# Patient Record
Sex: Male | Born: 1960
Health system: Southern US, Community
[De-identification: ages and names within clinical notes are randomized; demographics above are authoritative.]

## PROBLEM LIST (undated history)

## (undated) DIAGNOSIS — F32A Depression, unspecified: Secondary | ICD-10-CM

## (undated) DIAGNOSIS — I2699 Other pulmonary embolism without acute cor pulmonale: Secondary | ICD-10-CM

## (undated) DIAGNOSIS — F209 Schizophrenia, unspecified: Secondary | ICD-10-CM

## (undated) DIAGNOSIS — F329 Major depressive disorder, single episode, unspecified: Secondary | ICD-10-CM

## (undated) DIAGNOSIS — F609 Personality disorder, unspecified: Secondary | ICD-10-CM

## (undated) DIAGNOSIS — D849 Immunodeficiency, unspecified: Secondary | ICD-10-CM

## (undated) DIAGNOSIS — Z21 Asymptomatic human immunodeficiency virus [HIV] infection status: Secondary | ICD-10-CM

## (undated) DIAGNOSIS — I10 Essential (primary) hypertension: Secondary | ICD-10-CM

## (undated) DIAGNOSIS — B20 Human immunodeficiency virus [HIV] disease: Secondary | ICD-10-CM

---

## 1998-01-20 ENCOUNTER — Emergency Department (HOSPITAL_COMMUNITY): Admission: EM | Admit: 1998-01-20 | Discharge: 1998-01-20 | Payer: Self-pay | Admitting: *Deleted

## 1998-01-31 ENCOUNTER — Emergency Department (HOSPITAL_COMMUNITY): Admission: EM | Admit: 1998-01-31 | Discharge: 1998-01-31 | Payer: Self-pay | Admitting: Emergency Medicine

## 1998-02-07 ENCOUNTER — Inpatient Hospital Stay (HOSPITAL_COMMUNITY): Admission: EM | Admit: 1998-02-07 | Discharge: 1998-02-11 | Payer: Self-pay | Admitting: Emergency Medicine

## 1998-03-05 ENCOUNTER — Encounter: Admission: RE | Admit: 1998-03-05 | Discharge: 1998-03-05 | Payer: Self-pay | Admitting: Internal Medicine

## 1998-03-26 ENCOUNTER — Encounter: Admission: RE | Admit: 1998-03-26 | Discharge: 1998-03-26 | Payer: Self-pay | Admitting: Internal Medicine

## 1998-04-09 ENCOUNTER — Encounter: Admission: RE | Admit: 1998-04-09 | Discharge: 1998-04-09 | Payer: Self-pay | Admitting: Internal Medicine

## 1998-05-18 ENCOUNTER — Encounter: Admission: RE | Admit: 1998-05-18 | Discharge: 1998-05-18 | Payer: Self-pay | Admitting: Internal Medicine

## 1998-06-05 ENCOUNTER — Inpatient Hospital Stay (HOSPITAL_COMMUNITY): Admission: RE | Admit: 1998-06-05 | Discharge: 1998-06-07 | Payer: Self-pay | Admitting: *Deleted

## 1998-09-21 ENCOUNTER — Encounter: Payer: Self-pay | Admitting: Nephrology

## 1998-09-21 ENCOUNTER — Ambulatory Visit (HOSPITAL_COMMUNITY): Admission: RE | Admit: 1998-09-21 | Discharge: 1998-09-21 | Payer: Self-pay | Admitting: Nephrology

## 1998-10-08 ENCOUNTER — Emergency Department (HOSPITAL_COMMUNITY): Admission: EM | Admit: 1998-10-08 | Discharge: 1998-10-08 | Payer: Self-pay | Admitting: Emergency Medicine

## 1998-10-25 ENCOUNTER — Encounter: Payer: Self-pay | Admitting: Emergency Medicine

## 1998-10-25 ENCOUNTER — Emergency Department (HOSPITAL_COMMUNITY): Admission: EM | Admit: 1998-10-25 | Discharge: 1998-10-25 | Payer: Self-pay | Admitting: Emergency Medicine

## 1998-11-30 ENCOUNTER — Ambulatory Visit (HOSPITAL_COMMUNITY): Admission: RE | Admit: 1998-11-30 | Discharge: 1998-11-30 | Payer: Self-pay | Admitting: Hematology and Oncology

## 1998-11-30 ENCOUNTER — Encounter: Admission: RE | Admit: 1998-11-30 | Discharge: 1998-11-30 | Payer: Self-pay | Admitting: Hematology and Oncology

## 1999-01-05 ENCOUNTER — Encounter: Admission: RE | Admit: 1999-01-05 | Discharge: 1999-01-05 | Payer: Self-pay | Admitting: Internal Medicine

## 1999-02-09 ENCOUNTER — Encounter: Admission: RE | Admit: 1999-02-09 | Discharge: 1999-02-09 | Payer: Self-pay | Admitting: Internal Medicine

## 1999-03-03 ENCOUNTER — Ambulatory Visit (HOSPITAL_COMMUNITY): Admission: RE | Admit: 1999-03-03 | Discharge: 1999-03-03 | Payer: Self-pay | Admitting: Hematology and Oncology

## 1999-04-18 ENCOUNTER — Encounter: Admission: RE | Admit: 1999-04-18 | Discharge: 1999-04-18 | Payer: Self-pay | Admitting: Internal Medicine

## 1999-04-18 ENCOUNTER — Ambulatory Visit (HOSPITAL_COMMUNITY): Admission: RE | Admit: 1999-04-18 | Discharge: 1999-04-18 | Payer: Self-pay | Admitting: Internal Medicine

## 1999-07-18 ENCOUNTER — Ambulatory Visit (HOSPITAL_COMMUNITY): Admission: RE | Admit: 1999-07-18 | Discharge: 1999-07-18 | Payer: Self-pay | Admitting: Internal Medicine

## 1999-07-18 ENCOUNTER — Encounter: Admission: RE | Admit: 1999-07-18 | Discharge: 1999-07-18 | Payer: Self-pay | Admitting: Internal Medicine

## 1999-08-28 ENCOUNTER — Emergency Department (HOSPITAL_COMMUNITY): Admission: EM | Admit: 1999-08-28 | Discharge: 1999-08-28 | Payer: Self-pay | Admitting: Emergency Medicine

## 1999-12-23 ENCOUNTER — Emergency Department (HOSPITAL_COMMUNITY): Admission: EM | Admit: 1999-12-23 | Discharge: 1999-12-23 | Payer: Self-pay | Admitting: Emergency Medicine

## 2000-03-07 ENCOUNTER — Emergency Department (HOSPITAL_COMMUNITY): Admission: EM | Admit: 2000-03-07 | Discharge: 2000-03-07 | Payer: Self-pay

## 2000-03-16 ENCOUNTER — Emergency Department (HOSPITAL_COMMUNITY): Admission: EM | Admit: 2000-03-16 | Discharge: 2000-03-16 | Payer: Self-pay | Admitting: Emergency Medicine

## 2000-07-03 ENCOUNTER — Encounter: Payer: Self-pay | Admitting: *Deleted

## 2000-07-03 ENCOUNTER — Emergency Department (HOSPITAL_COMMUNITY): Admission: EM | Admit: 2000-07-03 | Discharge: 2000-07-03 | Payer: Self-pay | Admitting: *Deleted

## 2001-01-22 ENCOUNTER — Encounter: Admission: RE | Admit: 2001-01-22 | Discharge: 2001-01-22 | Payer: Self-pay | Admitting: Internal Medicine

## 2001-01-22 ENCOUNTER — Ambulatory Visit (HOSPITAL_COMMUNITY): Admission: RE | Admit: 2001-01-22 | Discharge: 2001-01-22 | Payer: Self-pay | Admitting: Internal Medicine

## 2001-01-29 ENCOUNTER — Encounter: Admission: RE | Admit: 2001-01-29 | Discharge: 2001-01-29 | Payer: Self-pay | Admitting: Internal Medicine

## 2001-07-09 ENCOUNTER — Emergency Department (HOSPITAL_COMMUNITY): Admission: EM | Admit: 2001-07-09 | Discharge: 2001-07-09 | Payer: Self-pay | Admitting: Emergency Medicine

## 2001-07-23 ENCOUNTER — Emergency Department (HOSPITAL_COMMUNITY): Admission: EM | Admit: 2001-07-23 | Discharge: 2001-07-24 | Payer: Self-pay | Admitting: Emergency Medicine

## 2001-07-24 ENCOUNTER — Encounter: Payer: Self-pay | Admitting: Emergency Medicine

## 2001-08-09 ENCOUNTER — Inpatient Hospital Stay (HOSPITAL_COMMUNITY): Admission: EM | Admit: 2001-08-09 | Discharge: 2001-08-27 | Payer: Self-pay | Admitting: *Deleted

## 2001-08-20 ENCOUNTER — Encounter: Admission: RE | Admit: 2001-08-20 | Discharge: 2001-08-20 | Payer: Self-pay | Admitting: Internal Medicine

## 2001-09-17 ENCOUNTER — Inpatient Hospital Stay (HOSPITAL_COMMUNITY): Admission: AD | Admit: 2001-09-17 | Discharge: 2001-09-27 | Payer: Self-pay | Admitting: Psychiatry

## 2001-09-29 ENCOUNTER — Encounter: Payer: Self-pay | Admitting: *Deleted

## 2001-09-29 ENCOUNTER — Inpatient Hospital Stay (HOSPITAL_COMMUNITY): Admission: EM | Admit: 2001-09-29 | Discharge: 2001-10-11 | Payer: Self-pay | Admitting: Psychiatry

## 2001-10-11 ENCOUNTER — Inpatient Hospital Stay (HOSPITAL_COMMUNITY): Admission: EM | Admit: 2001-10-11 | Discharge: 2001-10-16 | Payer: Self-pay | Admitting: Psychiatry

## 2001-11-01 ENCOUNTER — Encounter: Admission: RE | Admit: 2001-11-01 | Discharge: 2001-11-01 | Payer: Self-pay | Admitting: Internal Medicine

## 2001-11-01 ENCOUNTER — Ambulatory Visit (HOSPITAL_COMMUNITY): Admission: RE | Admit: 2001-11-01 | Discharge: 2001-11-01 | Payer: Self-pay | Admitting: Internal Medicine

## 2001-11-01 ENCOUNTER — Inpatient Hospital Stay (HOSPITAL_COMMUNITY): Admission: AD | Admit: 2001-11-01 | Discharge: 2001-11-02 | Payer: Self-pay | Admitting: Internal Medicine

## 2001-11-01 ENCOUNTER — Encounter: Payer: Self-pay | Admitting: Internal Medicine

## 2001-11-01 ENCOUNTER — Emergency Department (HOSPITAL_COMMUNITY): Admission: EM | Admit: 2001-11-01 | Discharge: 2001-11-01 | Payer: Self-pay | Admitting: *Deleted

## 2001-11-02 ENCOUNTER — Encounter: Payer: Self-pay | Admitting: Internal Medicine

## 2001-12-10 ENCOUNTER — Ambulatory Visit (HOSPITAL_COMMUNITY): Admission: RE | Admit: 2001-12-10 | Discharge: 2001-12-10 | Payer: Self-pay | Admitting: Internal Medicine

## 2001-12-10 ENCOUNTER — Encounter: Admission: RE | Admit: 2001-12-10 | Discharge: 2001-12-10 | Payer: Self-pay | Admitting: Internal Medicine

## 2002-03-19 ENCOUNTER — Encounter: Admission: RE | Admit: 2002-03-19 | Discharge: 2002-03-19 | Payer: Self-pay | Admitting: Internal Medicine

## 2002-04-02 ENCOUNTER — Ambulatory Visit (HOSPITAL_COMMUNITY): Admission: RE | Admit: 2002-04-02 | Discharge: 2002-04-02 | Payer: Self-pay | Admitting: Internal Medicine

## 2002-04-02 ENCOUNTER — Encounter: Admission: RE | Admit: 2002-04-02 | Discharge: 2002-04-02 | Payer: Self-pay | Admitting: Internal Medicine

## 2003-06-24 ENCOUNTER — Encounter: Payer: Self-pay | Admitting: Internal Medicine

## 2003-06-24 ENCOUNTER — Encounter: Admission: RE | Admit: 2003-06-24 | Discharge: 2003-06-24 | Payer: Self-pay | Admitting: Internal Medicine

## 2003-06-24 ENCOUNTER — Ambulatory Visit (HOSPITAL_COMMUNITY): Admission: RE | Admit: 2003-06-24 | Discharge: 2003-06-24 | Payer: Self-pay | Admitting: Internal Medicine

## 2003-11-29 ENCOUNTER — Inpatient Hospital Stay (HOSPITAL_COMMUNITY): Admission: EM | Admit: 2003-11-29 | Discharge: 2003-12-01 | Payer: Self-pay | Admitting: Emergency Medicine

## 2003-12-01 ENCOUNTER — Inpatient Hospital Stay (HOSPITAL_COMMUNITY): Admission: EM | Admit: 2003-12-01 | Discharge: 2003-12-07 | Payer: Self-pay | Admitting: Psychiatry

## 2003-12-16 ENCOUNTER — Encounter: Admission: RE | Admit: 2003-12-16 | Discharge: 2003-12-16 | Payer: Self-pay | Admitting: Internal Medicine

## 2004-01-06 ENCOUNTER — Ambulatory Visit (HOSPITAL_COMMUNITY): Admission: RE | Admit: 2004-01-06 | Discharge: 2004-01-06 | Payer: Self-pay

## 2004-01-06 ENCOUNTER — Encounter (INDEPENDENT_AMBULATORY_CARE_PROVIDER_SITE_OTHER): Payer: Self-pay | Admitting: Specialist

## 2004-02-11 ENCOUNTER — Ambulatory Visit (HOSPITAL_COMMUNITY): Admission: RE | Admit: 2004-02-11 | Discharge: 2004-02-11 | Payer: Self-pay | Admitting: Internal Medicine

## 2004-02-11 ENCOUNTER — Encounter: Admission: RE | Admit: 2004-02-11 | Discharge: 2004-02-11 | Payer: Self-pay | Admitting: Internal Medicine

## 2004-03-15 ENCOUNTER — Inpatient Hospital Stay (HOSPITAL_COMMUNITY): Admission: EM | Admit: 2004-03-15 | Discharge: 2004-03-25 | Payer: Self-pay | Admitting: Psychiatry

## 2006-09-08 ENCOUNTER — Ambulatory Visit: Payer: Self-pay | Admitting: *Deleted

## 2006-09-08 ENCOUNTER — Inpatient Hospital Stay (HOSPITAL_COMMUNITY): Admission: EM | Admit: 2006-09-08 | Discharge: 2006-09-20 | Payer: Self-pay | Admitting: *Deleted

## 2006-10-11 ENCOUNTER — Encounter (INDEPENDENT_AMBULATORY_CARE_PROVIDER_SITE_OTHER): Payer: Self-pay | Admitting: *Deleted

## 2006-10-11 ENCOUNTER — Encounter: Admission: RE | Admit: 2006-10-11 | Discharge: 2006-10-11 | Payer: Self-pay | Admitting: Internal Medicine

## 2006-10-11 ENCOUNTER — Ambulatory Visit: Payer: Self-pay | Admitting: Internal Medicine

## 2006-10-11 LAB — CONVERTED CEMR LAB
ALT: 16 units/L (ref 0–53)
AST: 16 units/L (ref 0–37)
Albumin: 3.8 g/dL (ref 3.5–5.2)
Alkaline Phosphatase: 72 units/L (ref 39–117)
BUN: 15 mg/dL (ref 6–23)
Basophils Absolute: 0 10*3/uL (ref 0.0–0.1)
Basophils Relative: 0 % (ref 0–1)
CD4 Count: 190 microliters
CD4 T Cell Abs: 190
CO2: 25 meq/L (ref 19–32)
Calcium: 9.6 mg/dL (ref 8.4–10.5)
Chlamydia, Swab/Urine, PCR: NEGATIVE
Chloride: 106 meq/L (ref 96–112)
Cholesterol: 139 mg/dL (ref 0–200)
Creatinine, Ser: 0.83 mg/dL (ref 0.40–1.50)
Eosinophils Absolute: 0 10*3/uL (ref 0.0–0.7)
Eosinophils Relative: 0 % (ref 0–5)
GC Probe Amp, Urine: NEGATIVE
Glucose, Bld: 93 mg/dL (ref 70–99)
HCT: 44.1 % (ref 39.0–52.0)
HCV Ab: NEGATIVE
HDL: 56 mg/dL (ref >39)
HIV 1 RNA Quant: 41800 copies/mL
HIV 1 RNA Quant: 41800 copies/mL — ABNORMAL HIGH (ref <50)
HIV-1 RNA Quant, Log: 4.62 — ABNORMAL HIGH (ref <1.70)
HIV-1 antibody: POSITIVE — AB
HIV-2 Ab: UNDETERMINED — AB
HIV: REACTIVE
Hemoglobin, Urine: NEGATIVE
Hemoglobin: 14.3 g/dL (ref 13.0–17.0)
Hep B Core Total Ab: POSITIVE — AB
Hepatitis B Surface Ag: NEGATIVE
LDL Cholesterol: 47 mg/dL (ref 0–99)
Leukocytes, UA: NEGATIVE
Lymphocytes Relative: 32 % (ref 12–46)
Lymphs Abs: 1.2 10*3/uL (ref 0.7–3.3)
MCHC: 32.4 g/dL (ref 30.0–36.0)
MCV: 95.7 fL (ref 78.0–100.0)
Monocytes Absolute: 0.5 10*3/uL (ref 0.2–0.7)
Monocytes Relative: 14 % — ABNORMAL HIGH (ref 3–11)
Neutro Abs: 2 10*3/uL (ref 1.7–7.7)
Neutrophils Relative %: 54 % (ref 43–77)
Nitrite: NEGATIVE
Platelets: 135 10*3/uL — ABNORMAL LOW (ref 150–400)
Potassium: 3.9 meq/L (ref 3.5–5.3)
Protein, ur: NEGATIVE mg/dL
RBC: 4.61 M/uL (ref 4.22–5.81)
RDW: 14.1 % — ABNORMAL HIGH (ref 11.5–14.0)
Sodium: 140 meq/L (ref 135–145)
Specific Gravity, Urine: 1.037 — ABNORMAL HIGH (ref 1.005–1.03)
Total Bilirubin: 0.3 mg/dL (ref 0.3–1.2)
Total CHOL/HDL Ratio: 2.5
Total Protein: 8.3 g/dL (ref 6.0–8.3)
Triglycerides: 179 mg/dL — ABNORMAL HIGH (ref <150)
Urine Glucose: NEGATIVE mg/dL
Urobilinogen, UA: 1 (ref 0.0–1.0)
VLDL: 36 mg/dL (ref 0–40)
WBC: 3.8 10*3/uL — ABNORMAL LOW (ref 4.0–10.5)
pH: 6.5 (ref 5.0–8.0)

## 2006-10-24 ENCOUNTER — Ambulatory Visit: Payer: Self-pay | Admitting: Internal Medicine

## 2006-10-29 ENCOUNTER — Ambulatory Visit: Payer: Self-pay | Admitting: *Deleted

## 2006-10-29 ENCOUNTER — Inpatient Hospital Stay (HOSPITAL_COMMUNITY): Admission: AD | Admit: 2006-10-29 | Discharge: 2006-11-01 | Payer: Self-pay | Admitting: *Deleted

## 2006-11-09 DIAGNOSIS — E039 Hypothyroidism, unspecified: Secondary | ICD-10-CM

## 2006-11-09 DIAGNOSIS — N12 Tubulo-interstitial nephritis, not specified as acute or chronic: Secondary | ICD-10-CM | POA: Insufficient documentation

## 2006-11-09 DIAGNOSIS — F32A Depression, unspecified: Secondary | ICD-10-CM | POA: Insufficient documentation

## 2006-11-09 DIAGNOSIS — F319 Bipolar disorder, unspecified: Secondary | ICD-10-CM | POA: Insufficient documentation

## 2006-11-09 DIAGNOSIS — F1910 Other psychoactive substance abuse, uncomplicated: Secondary | ICD-10-CM

## 2006-11-09 DIAGNOSIS — F411 Generalized anxiety disorder: Secondary | ICD-10-CM | POA: Insufficient documentation

## 2006-11-09 DIAGNOSIS — B20 Human immunodeficiency virus [HIV] disease: Secondary | ICD-10-CM | POA: Insufficient documentation

## 2006-11-09 DIAGNOSIS — F17200 Nicotine dependence, unspecified, uncomplicated: Secondary | ICD-10-CM

## 2006-11-09 DIAGNOSIS — F200 Paranoid schizophrenia: Secondary | ICD-10-CM | POA: Insufficient documentation

## 2006-11-09 DIAGNOSIS — D012 Carcinoma in situ of rectum: Secondary | ICD-10-CM | POA: Insufficient documentation

## 2006-11-09 DIAGNOSIS — B370 Candidal stomatitis: Secondary | ICD-10-CM

## 2006-11-09 DIAGNOSIS — A879 Viral meningitis, unspecified: Secondary | ICD-10-CM | POA: Insufficient documentation

## 2006-11-09 DIAGNOSIS — F329 Major depressive disorder, single episode, unspecified: Secondary | ICD-10-CM

## 2006-11-12 ENCOUNTER — Emergency Department (HOSPITAL_COMMUNITY): Admission: EM | Admit: 2006-11-12 | Discharge: 2006-11-13 | Payer: Self-pay | Admitting: Emergency Medicine

## 2006-11-19 ENCOUNTER — Encounter (INDEPENDENT_AMBULATORY_CARE_PROVIDER_SITE_OTHER): Payer: Self-pay | Admitting: *Deleted

## 2006-11-19 LAB — CONVERTED CEMR LAB
CD4 Count: 0 microliters
HIV 1 RNA Quant: 0 copies/mL

## 2006-11-22 ENCOUNTER — Encounter: Payer: Self-pay | Admitting: Internal Medicine

## 2006-12-02 ENCOUNTER — Encounter (INDEPENDENT_AMBULATORY_CARE_PROVIDER_SITE_OTHER): Payer: Self-pay | Admitting: *Deleted

## 2006-12-18 ENCOUNTER — Telehealth: Payer: Self-pay | Admitting: Internal Medicine

## 2007-01-06 ENCOUNTER — Emergency Department (HOSPITAL_COMMUNITY): Admission: EM | Admit: 2007-01-06 | Discharge: 2007-01-06 | Payer: Self-pay | Admitting: Emergency Medicine

## 2007-01-09 ENCOUNTER — Telehealth: Payer: Self-pay | Admitting: Internal Medicine

## 2007-03-08 ENCOUNTER — Telehealth: Payer: Self-pay | Admitting: Internal Medicine

## 2007-04-12 ENCOUNTER — Encounter (INDEPENDENT_AMBULATORY_CARE_PROVIDER_SITE_OTHER): Payer: Self-pay | Admitting: *Deleted

## 2007-07-09 ENCOUNTER — Encounter (INDEPENDENT_AMBULATORY_CARE_PROVIDER_SITE_OTHER): Payer: Self-pay | Admitting: *Deleted

## 2007-07-10 ENCOUNTER — Ambulatory Visit: Payer: Self-pay | Admitting: Internal Medicine

## 2007-07-10 ENCOUNTER — Encounter: Admission: RE | Admit: 2007-07-10 | Discharge: 2007-07-10 | Payer: Self-pay | Admitting: Internal Medicine

## 2007-07-10 DIAGNOSIS — M79609 Pain in unspecified limb: Secondary | ICD-10-CM | POA: Insufficient documentation

## 2007-07-10 LAB — CONVERTED CEMR LAB
ALT: 15 units/L (ref 0–53)
AST: 25 units/L (ref 0–37)
Albumin: 3.7 g/dL (ref 3.5–5.2)
Alkaline Phosphatase: 64 units/L (ref 39–117)
BUN: 18 mg/dL (ref 6–23)
Basophils Absolute: 0 10*3/uL (ref 0.0–0.1)
Basophils Relative: 0 % (ref 0–1)
CO2: 21 meq/L (ref 19–32)
Calcium: 9.3 mg/dL (ref 8.4–10.5)
Chloride: 110 meq/L (ref 96–112)
Creatinine, Ser: 1.04 mg/dL (ref 0.40–1.50)
Eosinophils Absolute: 0 10*3/uL (ref 0.0–0.7)
Eosinophils Relative: 1 % (ref 0–5)
Glucose, Bld: 104 mg/dL — ABNORMAL HIGH (ref 70–99)
HCT: 41.8 % (ref 39.0–52.0)
HIV 1 RNA Quant: 25500 copies/mL — ABNORMAL HIGH (ref <50)
HIV-1 RNA Quant, Log: 4.41 — ABNORMAL HIGH (ref <1.70)
Hemoglobin: 14 g/dL (ref 13.0–17.0)
Lymphocytes Relative: 36 % (ref 12–46)
Lymphs Abs: 1.4 10*3/uL (ref 0.7–3.3)
MCHC: 33.5 g/dL (ref 30.0–36.0)
MCV: 93.1 fL (ref 78.0–100.0)
Monocytes Absolute: 0.6 10*3/uL (ref 0.2–0.7)
Monocytes Relative: 15 % — ABNORMAL HIGH (ref 3–11)
Neutro Abs: 1.8 10*3/uL (ref 1.7–7.7)
Neutrophils Relative %: 48 % (ref 43–77)
Platelets: 80 10*3/uL — ABNORMAL LOW (ref 150–400)
Potassium: 3.9 meq/L (ref 3.5–5.3)
RBC: 4.49 M/uL (ref 4.22–5.81)
RDW: 14.5 % — ABNORMAL HIGH (ref 11.5–14.0)
Sodium: 141 meq/L (ref 135–145)
Total Bilirubin: 0.4 mg/dL (ref 0.3–1.2)
Total Protein: 7.9 g/dL (ref 6.0–8.3)
WBC: 3.8 10*3/uL — ABNORMAL LOW (ref 4.0–10.5)

## 2007-08-07 ENCOUNTER — Telehealth: Payer: Self-pay | Admitting: Internal Medicine

## 2007-09-03 ENCOUNTER — Ambulatory Visit: Payer: Self-pay | Admitting: Internal Medicine

## 2007-09-03 ENCOUNTER — Encounter (INDEPENDENT_AMBULATORY_CARE_PROVIDER_SITE_OTHER): Payer: Self-pay | Admitting: *Deleted

## 2007-09-03 ENCOUNTER — Encounter: Admission: RE | Admit: 2007-09-03 | Discharge: 2007-09-03 | Payer: Self-pay | Admitting: Internal Medicine

## 2007-09-03 DIAGNOSIS — F528 Other sexual dysfunction not due to a substance or known physiological condition: Secondary | ICD-10-CM | POA: Insufficient documentation

## 2007-09-03 LAB — CONVERTED CEMR LAB
ALT: 21 units/L (ref 0–53)
AST: 20 units/L (ref 0–37)
Albumin: 4 g/dL (ref 3.5–5.2)
Alkaline Phosphatase: 64 units/L (ref 39–117)
BUN: 22 mg/dL (ref 6–23)
Basophils Absolute: 0 10*3/uL (ref 0.0–0.1)
Basophils Relative: 0 % (ref 0–1)
CO2: 24 meq/L (ref 19–32)
Calcium: 9.4 mg/dL (ref 8.4–10.5)
Chloride: 107 meq/L (ref 96–112)
Creatinine, Ser: 1.11 mg/dL (ref 0.40–1.50)
Eosinophils Absolute: 0 10*3/uL — ABNORMAL LOW (ref 0.2–0.7)
Eosinophils Relative: 1 % (ref 0–5)
Glucose, Bld: 78 mg/dL (ref 70–99)
HCT: 42.5 % (ref 39.0–52.0)
HIV 1 RNA Quant: 53 copies/mL — ABNORMAL HIGH (ref <50)
HIV-1 RNA Quant, Log: 1.72 — ABNORMAL HIGH (ref <1.70)
Hemoglobin: 14.4 g/dL (ref 13.0–17.0)
Lymphocytes Relative: 34 % (ref 12–46)
Lymphs Abs: 1.2 10*3/uL (ref 0.7–4.0)
MCHC: 33.9 g/dL (ref 30.0–36.0)
MCV: 99.3 fL (ref 78.0–100.0)
Monocytes Absolute: 0.4 10*3/uL (ref 0.1–1.0)
Monocytes Relative: 11 % (ref 3–12)
Neutro Abs: 2 10*3/uL (ref 1.7–7.7)
Neutrophils Relative %: 54 % (ref 43–77)
Platelets: 152 10*3/uL (ref 150–400)
Potassium: 4.4 meq/L (ref 3.5–5.3)
RBC: 4.28 M/uL (ref 4.22–5.81)
RDW: 19.3 % — ABNORMAL HIGH (ref 11.5–15.5)
Sodium: 142 meq/L (ref 135–145)
Total Bilirubin: 0.4 mg/dL (ref 0.3–1.2)
Total Protein: 7.9 g/dL (ref 6.0–8.3)
WBC: 3.6 10*3/uL — ABNORMAL LOW (ref 4.0–10.5)

## 2007-09-05 ENCOUNTER — Telehealth: Payer: Self-pay | Admitting: Internal Medicine

## 2007-09-11 ENCOUNTER — Encounter: Payer: Self-pay | Admitting: Internal Medicine

## 2007-09-11 LAB — CONVERTED CEMR LAB
HCV Ab: NEGATIVE
Hep B S Ab: UNDETERMINED
Hepatitis B Surface Ag: NEGATIVE

## 2007-09-24 ENCOUNTER — Encounter (INDEPENDENT_AMBULATORY_CARE_PROVIDER_SITE_OTHER): Payer: Self-pay | Admitting: *Deleted

## 2007-09-24 ENCOUNTER — Encounter: Payer: Self-pay | Admitting: Internal Medicine

## 2007-10-11 ENCOUNTER — Telehealth: Payer: Self-pay | Admitting: Internal Medicine

## 2007-10-22 ENCOUNTER — Telehealth: Payer: Self-pay | Admitting: Internal Medicine

## 2007-11-05 ENCOUNTER — Telehealth: Payer: Self-pay | Admitting: Internal Medicine

## 2007-12-05 ENCOUNTER — Telehealth: Payer: Self-pay | Admitting: Internal Medicine

## 2007-12-12 ENCOUNTER — Encounter: Payer: Self-pay | Admitting: Internal Medicine

## 2008-01-10 ENCOUNTER — Telehealth (INDEPENDENT_AMBULATORY_CARE_PROVIDER_SITE_OTHER): Payer: Self-pay | Admitting: *Deleted

## 2008-01-14 ENCOUNTER — Telehealth: Payer: Self-pay | Admitting: Internal Medicine

## 2008-01-14 ENCOUNTER — Encounter (INDEPENDENT_AMBULATORY_CARE_PROVIDER_SITE_OTHER): Payer: Self-pay | Admitting: *Deleted

## 2008-02-07 ENCOUNTER — Telehealth (INDEPENDENT_AMBULATORY_CARE_PROVIDER_SITE_OTHER): Payer: Self-pay | Admitting: *Deleted

## 2008-02-13 ENCOUNTER — Encounter (INDEPENDENT_AMBULATORY_CARE_PROVIDER_SITE_OTHER): Payer: Self-pay | Admitting: *Deleted

## 2008-02-25 ENCOUNTER — Inpatient Hospital Stay (HOSPITAL_COMMUNITY): Admission: RE | Admit: 2008-02-25 | Discharge: 2008-03-04 | Payer: Self-pay | Admitting: Psychiatry

## 2008-02-25 ENCOUNTER — Ambulatory Visit: Payer: Self-pay | Admitting: Psychiatry

## 2008-03-04 ENCOUNTER — Telehealth (INDEPENDENT_AMBULATORY_CARE_PROVIDER_SITE_OTHER): Payer: Self-pay | Admitting: *Deleted

## 2008-05-05 ENCOUNTER — Telehealth: Payer: Self-pay | Admitting: Internal Medicine

## 2008-07-29 ENCOUNTER — Telehealth: Payer: Self-pay | Admitting: Internal Medicine

## 2008-12-08 ENCOUNTER — Telehealth (INDEPENDENT_AMBULATORY_CARE_PROVIDER_SITE_OTHER): Payer: Self-pay | Admitting: *Deleted

## 2009-01-20 ENCOUNTER — Encounter (INDEPENDENT_AMBULATORY_CARE_PROVIDER_SITE_OTHER): Payer: Self-pay | Admitting: *Deleted

## 2011-02-07 NOTE — H&P (Signed)
NAMEPAULINE, TRAINER NO.:  [id number]   MEDICAL RECORD NO.:  [id number]          PATIENT TYPE:  IPS   LOCATION:  0405                          FACILITY:  [facility]   PHYSICIAN:  Antonietta Breach, M.D.  DATE OF BIRTH:  Feb 23, 1961   DATE OF ADMISSION:  02/25/2008  DATE OF DISCHARGE:                       PSYCHIATRIC ADMISSION ASSESSMENT   DATE/TIME OF ASSESSMENT:  February 26, 2008, at 10:00 a.m.   IDENTIFYING INFORMATION:  A 50 year old divorced African American male.  This is a voluntary admission.   HISTORY OF PRESENT ILLNESS:  This is one of several psychiatric  admissions at [facility] for this pleasant 50 year old with a history of  schizophrenia, paranoid type.  He presented in the emergency room on  June 2 complaining of a lot of pain in his shoulder and had some sutures  in his head after he had fallen on a curb the day before.  He had also  been seen on June 1 at the [facility] emergency room for the  same complaint.  He then reported to mental health, who referred him for  psychiatric admission after they learned that he had been smoking  approximately 500 dollars' worth of cocaine within the past week.  Complaining about voices and feeling very depressed.  He is currently  living in a rooming house and says he does not have enough money to get  through the month even though he is on disability.  Estranged from his  wife and children and feeling depressed without a clear suicidal plan.  He also complained of auditory hallucinations for the past week with  voices screaming at him, decreased sleep, and having a lot of anxiety.  He also endorsed dysphoric mood, hopelessness, helplessness and a lot of  worry and fear about his medical issues.  Denies any homicidal thoughts.  Endorses continued auditory hallucinations today, also endorses using  some alcohol with last use on May 25.   PAST PSYCHIATRIC HISTORY:  This is the third or fourth [facility] admission  with his  last one being in February 2008, at that time also for  psychosis, and at that time he was discharged on Seroquel 100 mg q.6 h  p.r.n. and 100 mg q.h.s. and Depakote ER 500 mg p.o. q.h.s.   SOCIAL HISTORY:  Divorced, has 4 children with his wife, who has the  children living with her.  The children are ages 38, 37, 66 and 90.  He  has been unsuccessful with trying to contact them and visit them since  they keep moving frequently.  Currently living in a rented room.  Endorses problems with alcohol and cocaine abuse.  Both parents are  deceased.  He has 2 brothers and 2 sisters who are living and well.  He  denies current legal problems.   FAMILY HISTORY:  Remarkable for a father with history of alcoholism.   ALCOHOL AND DRUG HISTORY:  Endorses use of alcohol and crack with his  last use of alcohol on June 1, when he drank 2 fifths of whiskey.  History of abstinence is not clear.   MEDICAL HISTORY:  The patient is followed by Yisroel Ramming M.D., at the  [facility].  Medical problems include HIV  positivity and tardive dyskinesia.   CURRENT MEDICATIONS:  Kaletra 200/50 mg tablets 2 tablets b.i.d.,  Combivir 150 mg/300 mg 1 tablet b.i.d., Bactrim 80/160 one daily, and he  reports compliance with these medications.  Previously on Seroquel but  dose unclear.   DRUG ALLERGIES:  AMOXICILLIN.   PHYSICAL EXAMINATION:  Physical exam was done in the emergency room as  noted in the record.  He has a history of bruising on his nose and on  the right side of his face, also some bruising on his left knee and has  complained of some left shoulder pain.  Physical exam is noted in the  record.   Diagnostic studies were done in the emergency room.  His TSH is normal  at 1.595.  Metabolic panel reveals a normal chemistry with  the BUN at  22, creatinine 1.18.  Liver enzymes mildly elevated with SGOT 113, SGPT  46, alkaline phosphatase 79 and total bilirubin 0.9.  CBC:  WBC  5.3,  hemoglobin 14.9, hematocrit 43.4 and platelets 121,000.  MCV is 107.3.   MENTAL STATUS EXAM:  Fully alert male.  Pleasant, cooperative.  Guarded  affect.  A bit anxious.  Oriented x 3, in full contact with reality.  Thought processes are coherent, linear, appropriate.  Judgment is  slightly impaired.  Insight is partial.  Affect does appear anxious.  Speech minimal in production but otherwise normal in pace, tone and  form.  His eye contact is poor, not really wanting to carry on a lot of  conversation, but he is generally cooperative.  Oriented to person,  place and situation.  Acknowledges that he has been abusing substances  and asking for help.   AXIS I:  Schizophrenia, undifferentiated type, chronic with acute  exacerbation.  Rule out major depressive disorder.  Alcohol abuse, rule  out dependence.  Cocaine abuse.  AXIS II:  Deferred.  AXIS III:  HIV, tardive dyskinesia, facial laceration, healed and  sutured, left shoulder contusion, and macrocytosis NOS.  AXIS IV:  Severe issues with family stressors and grief over  estrangement from children.  AXIS V:  Current 30, past year not known.   The plan is to voluntarily admit the patient to stabilize him,  help him  with housing, and safely detox him off alcohol.  We started him on a  Librium protocol. We will add folic acid 1 mg.  Started him on Celexa 10  mg q.a.m. and Seroquel 900 mg h.s. for his hallucinations.   Estimated length of stay is 7 days.      Margaret A. Lorin Picket, N.P.      Antonietta Breach, M.D.  Electronically Signed    MAS/MEDQ  D:  02/26/2008  T:  02/26/2008  Job:  [id number]
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
## 2011-02-10 NOTE — Discharge Summary (Signed)
[facility]  Patient:    [name] Herring, [name] Herring Visit Number: [id number] MRN: [id number]          Service Type: EMS Location: [place] Attending Physician:  Carmelina Peal Dictated by:   Reymundo Poll Dub Mikes, M.D. Admit Date:  11/01/2001 Discharge Date: 11/01/2001                             Discharge Summary  CHIEF COMPLAINT/PRESENT ILLNESS:  This was one of multiple admissions to [facility] for this 50 year old male that was just recently discharged, less than eight hours, from the last hospitalization.  [facility] but [facility] not [facility] to [facility] him.  He felt unsafe on his own.  He felt agitated and fearful with some auditory hallucinations, and he went to the emergency room requesting readmission.  PAST PSYCHIATRIC HISTORY:  Multiple hospitalizations, being seen on an outpatient basis at the [facility].  SOCIAL HISTORY:  Separated from his wife, basically homeless.  ALCOHOL/DRUG HISTORY:  History of alcohol and crack cocaine dependence, recently detoxed.  MEDICAL PROBLEMS:  HIV positive.  MEDICATIONS:  Upon admission: 1. Abilify 15 in the morning and 30 at bedtime. 2. Trazodone 150 at bedtime.  PHYSICAL EXAMINATION:  Performed in the emergency room:  No active findings.  MENTAL STATUS EXAMINATION:  Upon admission, revealed an alert, cooperative male, some rigidity.  Anxious affect, polite, cooperative.  Mood depressed, anxious.  Affect broad.  Thought processes logical and appropriate, complains of some auditory hallucinations, no suicidal or homicidal ideas.  Concerned about the disposition.  Cognition, well oriented to person, place, and time.  ADMITTING DIAGNOSES: Axis I:    Schizophrenia, chronic paranoid type; alcohol and cocaine            dependence in early partial remission. Axis II:   No diagnosis. Axis III:  Human immunodeficiency virus (HIV) positive. Axis IV:    Moderate to severe. Axis V:    Upon admission 40-45, highest Global Assessment of Functioning in            the last year 60.  HOSPITAL COURSE:  He was readmitted.  He was placed back on his medications. We worked with him to try to find him a place to stay.  There was a bed available in the [facility].  He was to follow up at [facility].  Upon discharge, stable, hopeful that this is going to work out, no suicidal and no homicidal ideas.  DISCHARGE DIAGNOSES: Axis I:    Schizophrenia, chronic, paranoid type; cocaine and alcohol            dependence in early partial remission. Axis II:   No diagnosis. Axis III:  Human immunodeficiency virus (HIV) positive. Axis IV:   Moderate. Axis V:    Upon discharge 50.  DISCHARGE MEDICATIONS: 1. Abilify 15 in the morning and 30 at bedtime. 2. Cogentin 1 mg twice a day. 3. Trazodone 150 at bedtime for sleep.  DISPOSITION:  Discharged to [facility] and appointment at [facility]. Dictated by:   Reymundo Poll Dub Mikes, M.D. Attending Physician:  Carmelina Peal DD:  11/27/01 TD:  11/29/01 Job: [id number]

## 2011-02-10 NOTE — Op Note (Signed)
NAME:  [name] Herring, [name] Herring NO.:  [id number]   MEDICAL RECORD NO.:  [id number]                   PATIENT TYPE:  AMB   LOCATION:  DAY                                  FACILITY:  [facility]   PHYSICIAN:  Lorre Munroe., M.D.            DATE OF BIRTH:  1960/10/10   DATE OF PROCEDURE:  01/06/2004  DATE OF DISCHARGE:                                 OPERATIVE REPORT   PREOPERATIVE DIAGNOSIS:  Perianal and intraanal warts.   POSTOPERATIVE DIAGNOSIS:  Perianal and intraanal warts.   OPERATION:  Excision and cautery of warts.   SURGEON:  Zigmund Daniel, M.D.   ANESTHESIA:  General.   DESCRIPTION OF PROCEDURE:  After the patient was monitored and anesthetized  and had routine preparation and draping of the perineum, I thoroughly  examined the area and found a large number of very large, mostly  pedunculated perianal warts.  There were also some intraanal warts which  were palpable.  Working first externally where the warts formed a pedicle of  skin which appeared normal and right up to them, I cut off the pedicle and  the entire wart.  In other locations where the warts were more sessile, I  thoroughly cauterized them down to the area of the dermis and would wipe  them and then cauterize them again until I felt I had totally ablated the  warts.  I got hemostasis with the cautery.  After removing all of the  external warts, I used an intraanal retractor and treated the internal warts  in a similar fashion.  They were smaller and more sessile but fairly  extensive.  After treating and retreating all areas, I thoroughly  anesthetized the whole anus and perianal area with long acting local  anesthetic and applied a small, nonstick bandage.  The patient tolerated the  operation well.                                               Lorre Munroe., M.D.    WB/MEDQ  D:  01/06/2004  T:  01/06/2004  Job:  [id number]

## 2011-02-10 NOTE — Discharge Summary (Signed)
[place]  Patient:    [name] Herring, [name] Herring Visit Number: [id number] MRN: [id number]          Service Type: Attending:  C. Ulyess Mort, M.D. Dictated by:   Lendell Caprice, M.D. Adm. Date:  11/01/01 Disc. Date: 11/02/01                             Discharge Summary  CONTINUATION  LABORATORY DATA AND X-RAY FINDINGS:  Admission laboratories with sodium 136, potassium 3.6, chloride 108, CO2 29, BUN 20, creatinine 0.9, glucose 93. Total bilirubin 0.6, LDH 158.  White blood count 4.2, hemoglobin 13.4, platelets 96.  Alk phos 73, AST 26, ALT 22, total protein 7.9, albumin 3.4, calcium 9.2.  ABG on room air with pH 7.437, pCO2 37, pO2 88, saturating 96.9%.  HOSPITAL COURSE:  #1 - COUGH WITH PRODUCTIVE SPUTUM:  The patient was admitted and started on antibiotic therapy to cover for PCP.  However, this was very low on our differential.  After obtaining a normal LDH and the gas which showed no evidence of hypoxia, we did not feel that this patient had Pneumocystis pneumoniae.  We are unsure as to his CD4 count and he was started on opportunistic infection prophylaxis during this hospitalization.  His chest x-ray was negative for infiltrate and the patient was treated symptomatically for an acute bronchitis likely secondary to Streptococcus.  The patient is afebrile with leukocytosis and is discharged on a 10-day course of amoxicillin to be taken 500 mg p.o. b.i.d.  Blood cultures were obtained during this hospitalization as well as respiratory and sputum cultures.  AFB cultures were obtained which were negative x3.  The patients respiratory culture grew out abundant Streptococcus pneumonia, penicillin-sensitive.  The patient was instructed to complete his entire course of antibiotic therapy and will return to the clinic in two weeks time to see Dr. Aundria Rud, the patients primary care physician.  #2 - ATYPICAL CHEST PAIN:  The patient reported recent cocaine  use and the patient was evaluated on telemetry and had no evidence of arrhythmias.  He was cycled with enzymes and ruled out for any kind of acute myocardial event.  His EKG showed a normal sinus rhythm.  The etiology of the this patients chest pain was likely secondary to his acute infection with probable bronchitis. The patient did undergo a 2D echocardiogram that showed a normal LV function. The patient was instructed to discontinue his cocaine use and he will be seen in followup at [facility] for further drug rehabilitation treatment.  #3 - CARDIOMEGALY ON CHEST X-RAY:  The patients chest x-ray was reevaluated and positive for cardiomegaly.  Therefore, a 2D echocardiogram was obtained. The patient had no signs of heart failure.  According to the echocardiogram results, the patient has a mildly dilated right ventricle with mild decrease in function, but a normal left ventricle.  #4 - SCHIZOPHRENIA:  The patient was continued on his medications with Cogentin per [facility].  He will be seen at [facility] after discharge.  #5 - HISTORY OF SUICIDAL IDEATION:  The patient had recently been discharged on October 11, 2001, from [facility] and was put on suicide precautions during this hospitalization.  However, he did deny any suicidal ideation.  He will be seen by [facility] on Monday after discharge for further treatment of his schizophrenia.  #6 - ACQUIRED IMMUNE DEFICIENCY SYNDROME:  The patient was continued HAART two weeks ago  secondary to an acute schizophrenic episode.  The patient was covered for opportunistic infections and the patient was continued on his HAART therapy and discharged on Trizivir.  He will see Dr. Aundria Rud in followup in two weeks time.  #7 - RECTAL WARTS:  These are likely condylomata.  The patient was treated symptomatically with Anusol during this hospitalization with some relief.  In the future, this patient may need to be  referred for surgical removal should they continue to cause him significant pain.  #8 - AUDITORY HALLUCINATIONS LIKELY SECONDARY TO HIS SCHIZOPHRENIA AND BEING OFF HIS MEDICATIONS FOR THE LAST SEVERAL DAYS:  The patient was given a two-day supply of Zyprexa 40 mg p.o. q.d. after reporting that he had been on this medication until recently.  The patient will be seen at [facility] on Monday for further followup. Dictated by:   Lendell Caprice, M.D. Attending:  C. Ulyess Mort, M.D. DD:  02/04/02 TD:  02/06/02 Job: [id number]
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
## 2011-02-10 NOTE — H&P (Signed)
[facility]  Patient:    [name] Herring, [name] Herring Visit Number: [id number] MRN: [id number]          Service Type: PSY Location: 300 [id number] [id number] Attending Physician:  Jasmine Pang Dictated by:   Candi Leash. Orsini, N.P. Admit Date:  08/09/2001                     Psychiatric Admission Assessment  IDENTIFYING INFORMATION:  A 50 year old separated African-American male voluntarily admitted on August 09, 2001, for positive auditory hallucinations, positive suicidal ideation and positive homicidal ideation.  HISTORY OF PRESENT ILLNESS:  The patient presents with a history of schizophrenia.  He reports he is having auditory hallucinations to kill himself, and having homicidal ideation to kill his wifes boyfriend, with thoughts to douse with gasoline and strike a Microbiologist.  The patient reports that he is presently homeless.  His wife threw him and his belongings on the street.  The patient has a history of HIV and currently is receiving treatment.  He reports he is having decreased sleep, decreased appetite with no reported weight loss.  He reports he is no longer having thoughts to harm himself but would still harm his wifes boyfriend if he had the opportunity. He reports that he would like to have some long-term treatment.  PAST PSYCHIATRIC HISTORY:  Second admission to [facility].  He has a diagnosis of schizophrenia.  He has been at [facility] and [facility] in the past.  Last hospitalization was at [facility] about a month ago for psychosis.  The patient sees Dr. Jearld Lesch at [facility]  The patient has a past history of overdosing and "hanging himself while hospitalized.  SOCIAL HISTORY:  He is a 50 year old separated African-American male.  He has been separated for 2 years.  He has 4 children, ages 42, 4, 53 and 20.  He states he is homeless.  He is on disability for psychiatric illness. Completed the 12th  grade, no legal problems.  FAMILY HISTORY:  None.  ALCOHOL DRUG HISTORY:  The patient smokes 1-1/2 packs per day.  Alcohol intake is little to known. The patient has a past history of heroin IV use.  He has been clean for 8 months, with the longest history of being drug free has been 15 years.  PAST MEDICAL HISTORY:  Primary care [facility] is none.  Medical problems are HIV, diagnosed 4 years ago.  He attends [facility], infectious disease clinic, sees Dr. Aundria Rud.  MEDICATIONS:  Risperdal 4 mg q.h.s., Paxil 40 mg, Trizivir 1 p.o. b.i.d. for HIV.  DRUG ALLERGIES:  No known allergies.  PHYSICAL EXAMINATION  REVIEW OF SYSTEMS:  Patient denies any fever or chills, reports a changes in appetite with no reported weight loss.  No blurred or double vision.  No hearing loss or sinus pain.  No chest pain or palpitations.  Patient smokes, no shortness of breath or cough.  No heartburn or change in habits.  GI:  No dysuria, frequency, or hematuria.  MUSCULOSKELETAL:  No stiffness or swelling or joint pain.  SKIN: No itching, wounds or rash.  NEUROLOGIC:  No numbness on tingling.  PSYCHIATRIC:  History of schizophrenia with suicidal or homicidal thoughts and past suicide attempts.  ENDOCRINE:  No thyroid or diabetic problems.  LYMPHATIC:  History of HIV.  ALLERGIES:  No environmental allergies.  VITAL SIGNS:  97, 74, 22, blood pressure 112/63.  He is 158 pounds.  He  is 5 feet 8 inches tall.  LABORATORY DATA:  WBC count is low at 3.6, RDW is 19.1, platelet count is 134.  GENERAL APPEARANCE:    Patient is a 50 year old African-American male in no acute distress.  He is well developed, appears his stated age.  He is alert and cooperative, adequately groomed.  HEAD:  Normocephalic, he can raise his eyebrows.  His hair is short, dark and of equal distribution.  EYES:  His EOMs are intact bilaterally.  MOUTH:  His external ear canals are patent.  His hearing is appropriate to  conversation. No sinus tenderness or nasal discharge.  Mucosa is moist with fair dentition. No lesions were seen.  Tongue protrudes midline without tremor. NECK:  Supple, no JVD, negative lymphadenopathy.  Thyroid is nonpalpable and nontender.  Trachea is midline.  CHEST:  Clear to auscultation.  No adventitious sounds.  HEART:  Regular rate and rhythm, without murmurs, gallops or rubs.  Carotid pulses are equal and adequate.  GENITALIA EXAM is deferred.  ABDOMEN:  Soft, nontender abdomen.  No CVA tenderness. MUSCULOSKELETAL:  No joint swelling or deformity.  Good range of motion. Muscle strength and tone is equal bilaterally.  SKIN:  Warm and dry.  Nail beds are pink with good capillary refill.  NEUROLOGIC:  He is oriented x 3.  Good grip strength bilaterally.  No involuntary movements.  Cerebellar functions were intact, with heel-to-shin, normal alternating movements.  Romberg was negative.  MENTAL STATUS EXAMINATION:  He is an alert, middle-aged, thin, African-American male.  He is casually dressed with some odor present.  He is cooperative.  Speech is normal and relevant.  Mood is depressed, affect is blunt.  Thought processes are positive auditory hallucinations saying derogatory comments, positive homicidal ideation to the boyfriend of his wife. No suicidal ideation.  No paranoia.  Cognitive function is intact.  Memory is fair, judgment is poor, insight is poor.  ADMISSION DIAGNOSES: Axis I:    1. Psychosis not otherwise specified.            2. Rule out schizophrenia.            3. Rule out major depression with psychotic features. Axis II:   Deferred. Axis III:  HIV. Axis IV:   Problems with primary support group, housing and medical            problems and other psychosocial problems. Axis V:    Current 30, estimated this past year 60.  INITIAL PLAN OF CARE:  Voluntary admission to [facility] for psychosis, suicidal or homicidal ideations.  Contract  for safety, check every 15 minutes.  Patient contracts.  Will resume his routine medications.  Will  contact the outpatient department on Monday for recommendations.  Case worker to look at living arrangements after discharge.  Will obtain further labs. Our goal is to stabilize mood and thinking so patient and others can be safe, to follow up with [facility].  TENTATIVE LENGTH OF STAY:  Four to five days or more depending on housing and response to medications. Dictated by:   Candi Leash. Orsini, N.P. Attending Physician:  Jasmine Pang DD:  08/13/01 TD:  08/13/01 Job: [id number]

## 2011-02-10 NOTE — Discharge Summary (Signed)
NAME:  [name] Herring, [name] Herring NO.:  [id number]   MEDICAL RECORD NO.:  [id number]                   PATIENT TYPE:  IPS   LOCATION:  0507                                 [facility]ACILITY:  [facility]   PHYSICIAN:  Geoffery Lyons, M.D.                   DATE OF BIRTH:  June 28, 1961   DATE OF ADMISSION:  12/01/2003  DATE OF DISCHARGE:  12/07/2003                                 DISCHARGE SUMMARY   CHIEF COMPLAINT AND PRESENT ILLNESS:  This was the fourth admission to [facility] for this 50 year old separated African-American male  voluntarily admitted.  History of schizophrenia.  Transferred from the  medicine unit after taking overdose on an unknown amount of Seroquel,  Risperdal and possibly some Lexapro and ibuprofen.  He called the emergency  medical services from his hotel room, was taken to the emergency room and  admitted for medical clearance.  Felt overwhelmed by auditory hallucinations  with commands to hurt himself.  Gradually worse for the past four months.  Had begun using crack cocaine on a regular basis.  Medication compliance  questionable.  The voice he hears is the same voices as his father's,  criticizing him, telling him that he is not a good father to his children,  that he is useless and worthless and should do away with himself.   PAST PSYCHIATRIC HISTORY:  Fourth admission to [facility].  History of IV heroin use.  Denied any current use.  History of hanging  himself while hospitalized.  Has had psychosis since age 22.  Previously  stabilized on Abilify 15 mg in the morning and 30 mg at night, Cogentin 1 mg  twice a day.   ALCOHOL/DRUG HISTORY:  History of cocaine and alcohol abuse.  Longest period  clean 18 months.   PAST MEDICAL HISTORY:  HIV positive, possible hepatitis B and C.   MEDICATIONS:  Risperdal 2 mg twice a day, Combivent inhaler, Kaletra 3 caps  twice a day and Anusol cream to rectum, Septra DS 1 tablet  every Monday,  Wednesday and Friday.   PHYSICAL EXAMINATION:  Performed and failed to show any acute findings.   LABORATORY DATA:  Metabolic panel within normal limits.  CKMB, total CK 262  at the time of admission.  Cardiac enzymes were negative.  Urine drug screen  positive for barbiturates and cocaine.  Urinalysis revealed 15 mcg,  possibility of ketones and proteins 30.  CD4 was 160.  CBC with white blood  cells with 4.1.   MENTAL STATUS EXAM:  Fully alert male.  Pleasant, cooperative.  Good eye  contact.  Smiling but anxious affect.  Decreased volition.  Initiates very  little speech.  Able to say that he has had significant pain in the rectum.  Speech was normal.  Mood was anxious and depressed.  Thought processes  fairly clear.  He admits having  significant problems dealing with auditory  hallucinations with commands.  Does feel suicidal and hopeless about his  condition.  Nowhere to live.  No money to support himself.  Cognition well-  preserved.   ADMISSION DIAGNOSES:   AXIS I:  1. Schizoaffective disorder.  2. Cocaine and alcohol abuse.   AXIS II:  No diagnosis.   AXIS III:  1. HIV positive.  2. Rectal condyloma.  3. Rule out hepatitis B and C.   AXIS IV:  Moderate.   AXIS V:  Global Assessment of Functioning upon admission 24; highest Global  Assessment of Functioning in the last year 50-60.   HOSPITAL COURSE:  He was admitted and started intensive individual and group  psychotherapy.  He was placed on trazodone.  He was given Ativan as needed.  Combivir 1 tab twice a day, Kaletra 3 caps twice a day, Risperdal 2 mg twice  a day.  He was kept on the Septra daily on Monday, Wednesday and Thursday.  He was given Anusol cream.  __________ was not helpful, he was switched to  Ambien 10 mg at bedtime for sleep.  He endorsed increased depression.  Relapsed after he came out of the residential treatment center.  Endorsed  that he did not have a place to go.  Worried  about his D4 count.  All these  things have kept him depressed, despondent.  Worried, concerned.  He was  started on Zyprexa and we started on placement.  His mood and affect  continued to be depressed, dealing with the stressors of the illnesses, lack  of support, having to find a place to go.  We started Lexapro 5 mg per day.  By December 07, 2003, he was better.  He was in full contact with reality.  He  was going to go to a halfway house.  He was optimistic about the placement.  There were no suicidal ideation, no homicidal ideation, no hallucinations,  no delusions, motivated to pursue further outpatient treatment.   DISCHARGE DIAGNOSES:   AXIS I:  1. Schizophrenia, chronic, paranoid-type.  2. Cocaine and alcohol dependence.   AXIS II:  No diagnosis.   AXIS III:  1. Human immunodeficiency virus.  2. Rectal condyloma.   AXIS IV:  Moderate.   AXIS V:  Global Assessment of Functioning upon discharge 50.   DISCHARGE MEDICATIONS:  1. Combivir 150\300 mg 1 twice a day.  2. Kaletra 133.3\33.3 mg, 3 twice a day.  3. Risperdal 2 mg, 1 twice a day.  4. Septra DS 1 Monday, Wednesday and Friday.  5. Anusol apply to rectal area three times a day.  6. Cogentin 1 mg twice a day.  7. Zyprexa 5 mg, 1 in the morning, 2 at bedtime.  8. Lexapro 10 mg at bedtime for sleep.   FOLLOW UP:  [facility]                                               Geoffery Lyons, M.D.    IL/MEDQ  D:  01/03/2004  T:  01/04/2004  Job:  [id number]

## 2011-02-10 NOTE — Discharge Summary (Signed)
[name] Herring. [facility]  Patient:    [name], Herring Visit Number: [id number] MRN: [id number]          Service Type: Attending:  Alvester Morin, M.D. Dictated by:   Lendell Caprice, M.D. Adm. Date:  11/01/01 Disc. Date: 11/02/01   CC:         Outpatient Clinic  [name] Herring, M.D.   Discharge Summary  DISCHARGE DIAGNOSES:  1. Acute bronchitis.  2. Acute immunodeficiency syndrome diagnosed in May of 1999; last CD4     count in April 2002 with 190.  3. History of cocaine abuse in the past.  4. History of alcohol abuse in the past.  5. Schizophrenia with three hospitalizations at [facility], last     hospitalized September 30, 2001.  6. History of suicidal ideation during last hospitalization.  7. History of rectal warts.  8. History of pyelonephritis in May of 1999.  9. 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 10. History of pneumonia three years ago. 11. History of thrush in July 1999. 12. History of meningococcemia in May of 1999.  DISCHARGE MEDICATIONS:  1. Abilify 50 mg 1 p.o. b.i.d.  2. Cogentin 1 pill p.o. b.i.d.  3. Septra DS every Monday, Wednesday and Friday for prophylaxis.  4. Crixivir 1 pill p.o. b.i.d.  5. Azithromycin 1200 mg 1 pill once a week.  6. Amoxicillin 500 mg 1 p.o. b.i.d. for the next 10 days; please complete     entire course.  7. Anusol cream apply to rectal area q.d. p.r.n.  FOLLOW-UP:  [name] Herring is to call the clinic at [phone] to schedule hospital follow up with his primary physician, Dr. Aundria Rud, in the next two weeks.  He was discharged over the weekend and went [employer] at discharge.  He is currently homeless.  PROCEDURES AND DIAGNOSTIC STUDIES:  1. Two-D echocardiogram:  Impression:  left ventricular ejection fraction was estimated to be 60%, there were no left ventricular regional  wall abnormalities, the wall thickness was mildly increased, the rightt ventricle was mildly dilated, righ ventricular systolic function was mildly reduced, left ventricular function was normal, and the right ventricle seemed to be mildly dilated with mild decrease in function.   2. Chest x-ray; no active disease.  HISTORY OF PRESENT ILLNESS:  [name] Herring is a 50 year old African-American male with a past medical history significant for AIDS diagnosed in May of 1999 who was on antiretroviral therapy until two weeks ago.  He has a past medical history significant for schizophrenia, cocaine and alcohol abuse and presents to the [facility] with a chief complaint of productive cough.  He reports having this cough for approximately two weeks and states that on occasion it is red in color.  He denies any true hemoptysis.  He also denies any fever, but does state that he has had chills for the past several days.  He reports a three-day history of sore throat, but denies any exposure to any sick contacts.  He also denies any exposure to people with active TB and reports that his last PPD was at [facility] and was negative.  He has never been incarcerated, but has been in and out of psychiatric facilities over the past 1-2 years.  He also reports episodes over the past several months of chest pain, which is exertional and associated with  nausea and diaphoresis.  His last episode was yesterday before he did cocaine.  He states the chest pain lasts 2-10 minutes and is substernal in location.  He denies syncope, extremity numbness or history of heart attack in the past.  He describes shortness of breath when he has this chest pain.  He also states that the pain increases with movement and yesterday it occurred with rest.  His cardiac risk factors include sex, smoking and family history.  His lipid status is unknown.  He currently has no chest pain, no shortness of breath,  no nausea, vomiting, or abdominal pain.  LABORATORIES ON ADMISSION:  White count 4.2, hemoglobin 13.4, platelets 96,000.  Sodium 136, potassium 2.6, chloride 108, CO2 29, BUN 20, creatinine 0.9, and glucose 93.  LDH 158.  T bili 0.6, alk phos 73, AST 26, ALT 22, total protein 7.9, albumin 3.4, and calcium 9.2.  HOSPITAL COURSE:  #1 Cough:  The patients CD4 count was unknown since April of 2002 and therefore he was at risk for opportunistic infections.  According to his chest x-ray. Dictated by:   Lendell Caprice, M.D. Attending:  Alvester Morin, M.D. DD:  02/04/02 TD:  02/05/02 Job: [id number]

## 2011-02-10 NOTE — Discharge Summary (Signed)
[name] Herring, MEHRA NO.:  [id number]   MEDICAL RECORD NO.:  [id number]          PATIENT TYPE:  IPS   LOCATION:  0405                          FACILITY:  [facility]   PHYSICIAN:  Antonietta Breach, M.D.  DATE OF BIRTH:  01/16/1961   DATE OF ADMISSION:  02/25/2008  DATE OF DISCHARGE:  03/04/2008                               DISCHARGE SUMMARY   IDENTIFYING DATA AND REASON FOR ADMISSION:  Mr. Cordrey is a 50 year old  male admitted to the Adult Psychiatric Unit of [facility]  due to psychosis.   The patient was experiencing auditory hallucinations along with suicidal  ideation, insomnia, catastrophic anxiety, depressed mood, thoughts of  hopelessness and helplessness.   The patient had the ongoing stress of HIV.  Also, he was not being able  to visit his kids.   The patient had been using crack as well as alcohol.  He was drinking  two fifths of whiskey per day.  His last use had been on June 1.   PERTINENT MEDICAL DATA:  The patient presented with HIV, which was a  chronic state.  His white count was normal.  He also presented with  tardive dyskinesia.  These conditions did not exacerbate and did not  interfere with his improvement.   HOSPITAL COURSE:  Mr. Stebner was admitted to the Inpatient Adult  Psychiatric Unit of [facility] and underwent milieu and  group psychotherapy.  He was started on Seroquel in titrated to 900 mg  nightly.  He was given Celexa for anti-depression.   His Celexa was titrated to 20 mg q. a.m.  His Seroquel was titrated to  600 mg nightly.   By June 9 the patient had greatly improved.  His sleep was normal.  His  appetite was normal.  He was showing a broad range on affect.  He was  not having any thoughts of harming himself or others.  He was not having  any auditory or visual hallucinations.   The patient was placed on the Librium withdrawal protocol.   CONDITION ON DISCHARGE:  Please see the above.   DISCHARGE PLANNING:  See the discharge instruction sheet.   DISCHARGE MEDICATIONS:  1. Celexa 20 mg q. a.m.  2. Seroquel 600 mg nightly.   DISCHARGE DIAGNOSES:  AXIS I:  Schizophrenia undifferentiated type,  chronic with acute exacerbation, now stable.  Depressive disorder not  otherwise specified.  Polysubstance dependence.  AXIS II:  Deferred.  AXIS III:  Human immunodeficiency virus.  Tardive dyskinesia.  AXIS IV: Primary support group.  AXIS V: 55.      Antonietta Breach, M.D.  Electronically Signed     JW/MEDQ  D:  04/06/2008  T:  04/06/2008  Job:  [id number]

## 2011-02-10 NOTE — H&P (Signed)
NAME:  [name] Herring, [name] Herring NO.:  [id number]   MEDICAL RECORD NO.:  [id number]                   PATIENT TYPE:  IPS   LOCATION:  0507                                 FACILITY:  [facility]   PHYSICIAN:  Geoffery Lyons, M.D.                   DATE OF BIRTH:  08-16-1961   DATE OF ADMISSION:  12/01/2003  DATE OF DISCHARGE:  12/07/2003                         PSYCHIATRIC ADMISSION ASSESSMENT   IDENTIFYING INFORMATION:  This is a 50 year old separated African-American  male.  This is a voluntary admission.   HISTORY OF PRESENT ILLNESS:  This patient with a history of schizophrenia  was transferred from the medicine unit after taking an overdose of an  unknown amount of Seroquel, Risperdal, possibly some Lexapro and ibuprofen.  He called emergency medical services himself from his hotel room and was  taken to the emergency room and admitted for medical clearance.  The patient  says he had felt overwhelmed by the auditory hallucinations with commands to  harm himself.  He says that these have been gradually worsening for the past  4 months and he had begun using some crack cocaine on an irregular basis,  smoking it.  It is not clear whether he had been taking his medications  regularly or not.  The voice that he hears is the same voice as his father,  criticizing him, telling that he is not a good father to his children and  that he is useless and worthless and should just do away with himself.   PAST PSYCHIATRIC HISTORY:  This is the 4th admission to [facility] for this patient with a history of undifferentiated  schizophrenia, with his last admission being in January of 2003.  The  patient does have a past history of IV heroin use but denies any current IV  drug abuse.  He does have a history of hanging himself while previously  hospitalized and has had psychosis since age 72.  He was previously  stabilized on Abilify 15 mg q.a.m. and 30 mg p.o.  q.h.s., Cogentin 1 mg p.o.  b.i.d.  On his previous admission Risperdal had been stopped in December of  2002 because it had caused him EPS.   FAMILY HISTORY:  Unclear.   ALCOHOL AND DRUG HISTORY:  The patient has a history of cocaine and alcohol  abuse, with his longest period clean and sober being 18 months  consecutively.   PAST MEDICAL HISTORY:  The patient is followed in the infectious disease  clinic at [facility] by Dr. Aundria Rud.  Medical problems are HIV  positivity, questionable diagnosis of possible hepatitis B and C but which  is unclear, and a rectal condyloma.   MEDICATIONS:  Risperdal 2 mg p.o. b.i.d., Combivir inhaler, Kaletra 3  tablets b.i.d. and Anusol cream to rectum b.i.d. for comfort.  The patient  is on Septra DS 1 tablet every Monday,  Wednesday and Friday.   DRUG ALLERGIES:  None.   REVIEW OF SYSTEMS:  The patient denies any fever or chills, productive  cough, or shortness of breath.  His sleep has been decreased down to 2 or 3  hours at one time daily for about the last week or two.  He also notes  significant rectal pain which he attributes to his genital warts.   POSITIVE PHYSICAL FINDINGS:  Please see the full physical examination that  was done on the medicine unit today.  This is a slim-built, medium -height  African-American male, approximately 5 feet 9 inches tall, weighs 162 pounds  on admission.  Pulse is 77 and regular, respirations 22, blood pressure  132/87.  Neurologic exam is remarkable for some rhythmic jaw clenching and  pill rolling movements that predominant in his right hand greater than his  left hand.  We have done a rectal exam on this patient and he has a large  cauliflower-appearing mass projecting from his gluteal folds which is  reddened and in spots excoriated.  He has a slight fissure along the right  side of the mass.  He rates his pain 9-10 on a scale of 1-10 and admits  having significant difficulties sitting and  walking.   DIAGNOSTIC STUDIES:  Reveal that the patient's metabolic panel is within  normal limits.  His CK MB, total CK was at 262 at the time of admission to  the medicine service, but cardiac enzymes were negative.  Patient's urine  drug screen was positive for barbiturates and cocaine.  His urinalysis  revealed 15 mg/dl of ketones and protein 30.  His CD4 count was 160.  CBC  revealed a WBC of 4.1 and platelets of 120,000.   MENTAL STATUS EXAM:  This is a fully alert male who is pleasant and  cooperative, with good eye contact.  He is smiling but has an anxious  affect, decreased volition and initiates very little speech but is able to  say that he has had significant pain in his rectum.  Speech is normal.  Mood  is anxious and depressed.  Thought process is fairly clear and he admits  having significant problems today with auditory hallucinations with  commands, does feel suicidal and hopeless about his condition, primarily  because he has nowhere to live and has no money to support himself.  Cognitively he is intact and oriented x3.  No evidence of homicidal thought.  Impulse control and judgment are impaired.  Insight fair to poor.   ADMISSION DIAGNOSIS:   AXIS I:  1. Schizophrenia, chronic, paranoid type versus undifferentiated type.  2. Cocaine abuse.  3. Ethyl alcohol abuse in partial remission.   AXIS II:  No diagnosis.   AXIS III:  1. Rule out neuroleptic-induced dyskinesia status post polypharmacy     overdose.  2. HIV positivity.  3. Rectal condyloma.   AXIS IV:  Severe problems, with homelessness.   AXIS V:  Current 24, past year 55-60.   INITIAL PLAN OF CARE:  Plan is to voluntarily admit the patient with q.16  minute checks.  We are going to start him on dual diagnosis programming and  we will ask the case manager to assist him with housing issues.  We are  going to start him at this point on Zyprexa 5 mg p.o. q.noon and 10 mg q.h.s. and we will contact Dr.  Aundria Rud to coordinate his care and set up  follow-up appointments for his HIV status and  referral to a surgeon for his  condyloma.  Meanwhile, we are going to put him on a program of sitz baths  and regular applications of the Anusol  cream to see if we can alleviate some of his rectal discomfort.  We have  discussed the plan with him and he has voiced his agreement.   ESTIMATED LENGTH OF STAY:  5 days.     Margaret A. Scott, N.P.                   Geoffery Lyons, M.D.    MAS/MEDQ  D:  12/08/2003  T:  12/08/2003  Job:  [id number]
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
## 2011-02-10 NOTE — Discharge Summary (Signed)
NAME:  [name] Herring, [name] Herring NO.:  [id number]   MEDICAL RECORD NO.:  [id number]                   PATIENT TYPE:  IPS   LOCATION:  0404                                 FACILITY:  [facility]   PHYSICIAN:  Jeanice Lim, M.D.              DATE OF BIRTH:  1960-12-21   DATE OF ADMISSION:  03/15/2004  DATE OF DISCHARGE:  03/25/2004                                 DISCHARGE SUMMARY   IDENTIFYING DATA:  This is a 50 year old separated African-American male  voluntarily admitted, presented with a suicidal ideation and homicidal  ideation to hurt others, no one in particular.  Feels people are harassing  her.  She has not been sleeping.  She reports having been compliant with  medications.  Had been drinking and using cocaine.  This is the second  admission to [facility].  Had been followed up at [facility].  History of suicidal ideation.  Tried to overdose, hang self and cut  wrist in past.   MEDICAL PROBLEMS:  HIV.   MEDICATIONS:  Kaletra 3 b.i.d., Combivir b.i.d., Septra Monday, Wednesday,  Friday, Risperdal 2 mg q.h.s.   ALLERGIES:  AMOXICILLIN.   PHYSICAL EXAMINATION:  Physical exam and neurologic essentially within  normal limits.   LABORATORY DATA:  Routine admission labs within normal limits.   MENTAL STATUS EXAM:  Sleepy, young, middle-aged.  Attempting to be  cooperative.  Speech clear.  Mood depressed, fearful.  Affect flat.  Thought  processes endorsing thoughts to hurt self and others.  Denies  hallucinations.  Cognitively intact.  Judgment and insight poor.   ADMISSION DIAGNOSES:   AXIS I:  Psychotic disorder not otherwise specified.   AXIS II:  Deferred.   AXIS III:  Human immunodeficiency virus.   AXIS IV:  Moderate to severe (problems with housing and other psychosocial  issues).   AXIS V:  30/55.   HOSPITAL COURSE:  The patient was admitted and ordered routine p.r.n.  medications and underwent further monitoring.   Was encouraged to participate  in individual, group and milieu therapy.  Was placed on safety checks.  HIV  medicines resumed and Risperdal continued.  The patient reported voices  telling him to hurt himself and others.  Had no response to Risperdal Consta  but a response to Haldol.  Was started on Haldol and this was optimized as  tolerated.  The patient reported a decrease in symptoms.  Reported voices  still telling him to kill people and kill himself.  There is no initial  change in psychotic symptoms.  Then began to gradually become less  commanding and less distressing.  Still complained of not sleeping well and  appeared to develop some extrapyramidal symptoms with shuffling gait and  other parkinsonian symptoms, some cogwheeling suggestive of sensitivity to  Haldol.  Cogentin was optimized and these symptoms cleared.   CONDITION ON DISCHARGE:  Improved.  His  thinking was goal directed.  There  is some short-term memory impairment, which may be related to his long-  standing HIV.  However, no command hallucinations.  No dangerous ideation.  Mood was stable.  The patient reported motivation to be compliant with the  aftercare plan and was discharged with no dangerous ideation.  Given  medication education.   DISCHARGE MEDICATIONS:  1. Seroquel 100 mg, 1-2 q.h.s.  2. Symmetrel 100 mg b.i.d.  3. Cogentin 2 mg b.i.d.  4. Zyprexa Zydis 20 mg q.h.s.  5. Combivir 150-300 mg, 1 two times a day.  6. Bactrim DS 1 at 10 a.m. on Monday, Wednesday, Friday.  7. Kaletra 133.3\33.3 mg, 3 twice a day.  8. Trazodone 100 mg, 2 q.h.s.  9. Ambien 10 mg q.h.s. p.r.n.   FOLLOW UP:  The patient was to follow up at [facility]  [facility] on March 30, 2004 at 2 p.m.   DISCHARGE DIAGNOSES:   AXIS I:  Psychotic disorder not otherwise specified.   AXIS II:  Deferred.   AXIS III:  Human immunodeficiency virus.   AXIS IV:  Moderate to severe (problems with  housing and other psychosocial  issues).   AXIS V:  Global Assessment of Functioning on discharge 50-55.                                               Jeanice Lim, M.D.    [name] Herring  D:  04/16/2004  T:  04/17/2004  Job:  [id number]
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
## 2011-02-10 NOTE — Discharge Summary (Signed)
[facility]  Patient:    [name] Herring, [name] Herring Visit Number: [id number] MRN: [id number]          Service Type: EMS Location: [place] Attending Physician:  Carmelina Peal Dictated by:   Reymundo Poll Dub Mikes, M.D. Admit Date:  11/01/2001 Discharge Date: 11/01/2001                             Discharge Summary  CHIEF COMPLAINT AND HISTORY OF PRESENT ILLNESS:  This was one of several admissions to [facility] for this 50 year old male separated from his wife.  Came to the emergency room reporting to the rescue squad that he had overdosed on approximately seven tablets of Abilify and had drank about one half gallon of vodka.  Wanted to kill himself because he "hates the world."  He had used crack cocaine the night prior to this admission.  Hearing voices for the past few days.  In spite, he stated he has been compliant with Abilify which is very doubtful.  PAST PSYCHIATRIC HISTORY:  [facility].  Diagnosis of schizophrenia.  Multiple prior admissions; most recent November 15 and December 24 to January 3.  Used cocaine the day before admission.  PAST MEDICAL HISTORY:  HIV Positive.  MEDICATIONS ON ADMISSION: 1. Antivirals withheld at this particular time. 2. Abilify 50 mg every day. 3. Paxil CR 25 mg every day. 4. Trazodone 100 mg as needed for sleep.  MENTAL STATUS EXAMINATION ON ADMISSION:  Mildly tremulous patient, fully alert, appears to be in no acute distress, rather flat in affect, some dysphoric, skin slightly damp.  Affect is blunted.  He is cooperative with the interviewer.  Speech was muffled with his voice mumbled and tone low but otherwise normal in pace, nonspontaneous.  Mood was depressed and sad. Thought processes: Positive for auditory hallucinations according to his report, hears a male telling him that he is no good.  ADMITTING DIAGNOSES: Axis I:    1. Schizophrenia by history.            2.  Cocaine and alcohol abuse. Axis II:   No diagnosis. Axis III:  Human immunodeficiency virus positive. Axis IV:   Moderate. Axis V:    Global assessment of functioning upon admission 26, highest global            assessment of functioning in the last year 60.  HOSPITAL COURSE:  He was readmitted and started back on his medications.  The issue of placement was again a concern.  We were going to try to find a place for him but was having trouble finding a place.  He was denied admission at the [facility].  We had a referral to the shelter.  Finding a place to stay became the most stressful event.  On January 17, we were ready to let him go. He had obtained full benefit, detoxified, the medications were working, he objectively looked better, better hygiene, more spontaneous, more involved, no side effects to the medications, for which discharge was considered and granted to go to the shelter.  DISCHARGE DIAGNOSES: Axis I:    1. Schizophrenia, paranoid type.            2. Alcohol and cocaine abuse. Axis II:   No diagnosis. Axis III:  Human immunodeficiency virus positive. Axis IV:   Moderate. Axis V:    Global assessment of functioning upon discharge 50-55.  DISCHARGE MEDICATIONS:  1. Abilify 50 mg one in the morning and two at night. 2. Trazodone 150 mg at bedtime for sleep. 3. Cogentin 1 mg twice a day.  FOLLOWUP:  [facility]. Dictated by:   Reymundo Poll Dub Mikes, M.D. Attending Physician:  Carmelina Peal DD:  11/13/01 TD:  11/14/01 Job: [id number]

## 2011-02-10 NOTE — H&P (Signed)
[facility]  Patient:    [name] Herring, [name] Herring Visit Number: [id number] MRN: [id number]          Service Type: EMS Location: [place] Attending Physician:  Doug Sou Dictated by:   Young Berry Scott, N.P. Admit Date:  09/29/2001 Discharge Date: 09/29/2001                     Psychiatric Admission Assessment  DATE OF ASSESSMENT:  September 30, 2001 at 10:30 a.m.  IDENTIFYING INFORMATION:  This is a 50 year old African-American male, who is separated from his wife.  This is a voluntary admission.  HISTORY OF PRESENT ILLNESS:  This patient presented to the emergency room after reporting to rescue squad that he had overdosed on approximately seven tablets of Abilify believed to be 15 mg and had drunk about one-half gallon of vodka.  This is according to the emergency medical services report. At that time, he told them that he wanted to kill himself because he "hates the world."  The patient also had reported, at that time, that he had used crack cocaine the night prior to his admission.  Today, the patient does complain of hearing voices for the past few days in spite of he states he has been compliant with his Abilify.  He hears one single male voice telling him to die and that he is no good.  He is able to promise safety today on the unit.  He denies any homicidal ideation.  He does report that he had been drinking and had been using cocaine.  The patient is not able to cite any specific stressors to trigger his drinking or his relapses.  He does report that he continues to be separated from his wife and, since his discharge here on January 3rd, that he has been living in a motel and is homeless.  PAST PSYCHIATRIC HISTORY:  The patient is followed at [facility] with a past diagnosis of schizophrenia.  He has a history of multiple prior admissions to [facility] with his most recent being August 09, 2001 and  September 17, 2001 through September 27, 2001.  The patient has a history of IV heroin use in the distant past and he denies any use of that recently.  He has used cocaine, he states, one time only one day prior to admission and he states that was his only time.  The patient does have a history of prior suicide attempts including an attempt at hanging himself while he was previously hospitalized.  His longest period being drug-free at any time was 18 months consecutively.  SOCIAL HISTORY:  The patient has been separated from his wife on an on and off basis for the past two years.  She last was living with him approximately two months ago and he states that she put his belongings on the steps and locked him out of the house.  This was in either late November or early December and he has been essentially homeless since that time, most recently living in a motel.  He has four children, ages 42, 32, 56 and 63.  He is currently on disability for his mental illness.  He denies any legal problems at this time.  FAMILY HISTORY:  The patient denies any history of mental illness or substance abuse.  ALCOHOL/DRUG HISTORY:  The patients urine drug screen was positive for benzodiazepines and cocaine.  His alcohol level, in the emergency room, was less than 5,  which does seem incongruent with his report of drinking one-half gallon of vodka.  He does smoke 1-1/2 packs per day of cigarettes.  MEDICAL HISTORY:  The patient has been followed in the past at [facility] by Dr. Aundria Rud, who manages his HIV.  The patient was diagnosed in HIV in either 1999 or 1998 and has been generally noncompliant with his antiviral therapy and with follow-up appointments.  That is his only primary medical problem.  MEDICATIONS:  Antivirals, which were used in the past but are now being withheld at the recommendation of Dr. Aundria Rud until he can see him for a re-evaluation.  The patient had a clinic  appointment scheduled October 01, 2001 to see Dr. Aundria Rud.  Other previous medications, after his last discharge, were Abilify 15 mg daily, Paxil 25 mg CR daily and trazodone 100 mg q.h.s.  DRUG ALLERGIES:  No known drug allergies.  POSITIVE PHYSICAL FINDINGS:  The patient was seen in the emergency room with no remarkable findings, although he was a bit groggy.  His vital signs, on admission to the unit, were within normal limits with his pulse being 105. Blood pressure was mildly elevated at 160/91.  The patient is 5 feet 9 inches tall and weighs 141 pounds.  This morning, his vital signs were pulse 110, blood pressure 142/83.  LABORATORY DATA:  As previously noted, his ETOH level was less than 5 in the emergency room.  His metabolic noted BUN of 21.  Electrolytes within normal limits, hemoglobin 15, hematocrit 43, creatinine 0.9.  Acetaminophen level was 0.  Salicylate level was less than 4.  Alcohol level was less than 10.  He has a hepatic function currently pending.  We have chosen not to repeat his thyroid panel since he has just had that done recently on a previous admission and it was within normal limits.  MENTAL STATUS EXAMINATION:  This is a mildly tremulous patient, who is fully alert and appears to be in no acute distress with a rather flattened affect. He does seem a bit diaphoretic with his skin slightly damp.  His affect is blunted.  He is cooperative with the interview.  Speech is muffled with his words mumbled and his tone is low but is otherwise normal in pace.  No spontaneity to his speech.  Mood is depressed and sad.  Thought process is positive for auditory hallucinations, according to his report, continuing to hear a male voice telling him that he is no good, although this is bit fainter since he has had some Zyprexa.  No homicidal ideation evident.  No visual hallucinations.  He is positive for suicidal ideation but is able to promise safety on the unit.  He has  no specific plan.  Cognitively, he is intact and oriented x 4.  DIAGNOSES: Axis I:    1. Rule out substance-induced psychosis.            2. Cocaine abuse; rule out dependence.             3. Alcohol abuse; rule out dependence.            4. Schizophrenia by history. Axis II:   Deferred. Axis III:  Human immunodeficiency virus (HIV) positive. Axis IV:   Moderate (problems with the primary support group, specifically            conflict with his wife and lack of an adequate support system and  moderate to severe housing problems since the patient is homeless). Axis V:    Current 26; past year 35.  PLAN:  Voluntarily admit the patient to treat his psychosis with our goal to alleviate his psychotic symptoms and his suicidal ideation.  We will ask the nurses to check a CIWA on him and get [place] that score if they have not already done so.  We did not see one in the record.  We have elected to start him on a phenobarbital 30 mg q.i.d. p.r.n. for withdrawal symptoms.  Since his vital signs really do not reflect any withdrawal and the patient had no alcohol level in his blood, we have elected to monitor this and make phenobarbital available to him.  Meanwhile, we will restart his Paxil CR 25 mg p.o. daily and give him trazodone 100 mg at night and we have restarted him on Zyprexa 5 mg q.a.m. and q.h.s. for hallucinations.  We will also reschedule his January 7th infectious disease clinic appointment and get him into follow-up with Dr. Aundria Rud.  ESTIMATED LENGTH OF STAY:  Three to four days. Dictated by:   Young Berry Scott, N.P. Attending Physician:  Doug Sou DD:  09/30/01 TD:  09/30/01 Job: [id number]

## 2011-06-22 LAB — CBC
HCT: 43.4
Hemoglobin: 14.9
MCHC: 34.4
MCV: 107.3 — ABNORMAL HIGH
Platelets: 121 — ABNORMAL LOW
RBC: 4.05 — ABNORMAL LOW
RDW: 14.1
WBC: 5.3

## 2011-06-22 LAB — COMPREHENSIVE METABOLIC PANEL
Albumin: 3.7
Alkaline Phosphatase: 79
BUN: 22
CO2: 24
Chloride: 108
GFR calc non Af Amer: >60
Glucose, Bld: 98
Potassium: 3.8
Total Bilirubin: 0.9

## 2011-07-03 LAB — T-HELPER CELL (CD4) - (RCID CLINIC ONLY)
CD4 % Helper T Cell: 15 — ABNORMAL LOW
CD4 T Cell Abs: 190 — ABNORMAL LOW

## 2011-07-05 LAB — T-HELPER CELL (CD4) - (RCID CLINIC ONLY)
CD4 % Helper T Cell: 11 — ABNORMAL LOW
CD4 T Cell Abs: 140 — ABNORMAL LOW

## 2014-06-30 ENCOUNTER — Emergency Department (HOSPITAL_COMMUNITY)
Admission: EM | Admit: 2014-06-30 | Discharge: 2014-07-01 | Disposition: A | Discharge status: Other Acute Inpatient Hospital | Payer: Medicare HMO | Attending: Emergency Medicine | Admitting: Emergency Medicine

## 2014-06-30 ENCOUNTER — Encounter (HOSPITAL_COMMUNITY): Payer: Self-pay | Admitting: Emergency Medicine

## 2014-06-30 DIAGNOSIS — G8929 Other chronic pain: Secondary | ICD-10-CM | POA: Diagnosis not present

## 2014-06-30 DIAGNOSIS — Z21 Asymptomatic human immunodeficiency virus [HIV] infection status: Secondary | ICD-10-CM | POA: Diagnosis not present

## 2014-06-30 DIAGNOSIS — Z880 Allergy status to penicillin: Secondary | ICD-10-CM | POA: Insufficient documentation

## 2014-06-30 DIAGNOSIS — I10 Essential (primary) hypertension: Secondary | ICD-10-CM | POA: Diagnosis not present

## 2014-06-30 DIAGNOSIS — Z79899 Other long term (current) drug therapy: Secondary | ICD-10-CM | POA: Diagnosis not present

## 2014-06-30 DIAGNOSIS — Z720 Tobacco use: Secondary | ICD-10-CM | POA: Insufficient documentation

## 2014-06-30 DIAGNOSIS — R079 Chest pain, unspecified: Secondary | ICD-10-CM | POA: Diagnosis not present

## 2014-06-30 DIAGNOSIS — F251 Schizoaffective disorder, depressive type: Secondary | ICD-10-CM | POA: Diagnosis not present

## 2014-06-30 DIAGNOSIS — M25511 Pain in right shoulder: Secondary | ICD-10-CM | POA: Diagnosis not present

## 2014-06-30 DIAGNOSIS — Z008 Encounter for other general examination: Secondary | ICD-10-CM | POA: Diagnosis present

## 2014-06-30 DIAGNOSIS — F200 Paranoid schizophrenia: Secondary | ICD-10-CM

## 2014-06-30 HISTORY — DX: Schizophrenia, unspecified: F20.9

## 2014-06-30 HISTORY — DX: Essential (primary) hypertension: I10

## 2014-06-30 HISTORY — DX: Asymptomatic human immunodeficiency virus (hiv) infection status: Z21

## 2014-06-30 HISTORY — DX: Human immunodeficiency virus (HIV) disease: B20

## 2014-06-30 HISTORY — DX: Depression, unspecified: F32.A

## 2014-06-30 HISTORY — DX: Major depressive disorder, single episode, unspecified: F32.9

## 2014-06-30 LAB — RAPID URINE DRUG SCREEN, HOSP PERFORMED
AMPHETAMINES: NOT DETECTED
BENZODIAZEPINES: NOT DETECTED
Barbiturates: NOT DETECTED
COCAINE: POSITIVE — AB
OPIATES: NOT DETECTED
Tetrahydrocannabinol: NOT DETECTED

## 2014-06-30 LAB — ACETAMINOPHEN LEVEL: Acetaminophen (Tylenol), Serum: <15 ug/mL (ref 10–30)

## 2014-06-30 LAB — CBC
HCT: 44.4 % (ref 39.0–52.0)
Hemoglobin: 15.1 g/dL (ref 13.0–17.0)
MCH: 37.2 pg — AB (ref 26.0–34.0)
MCHC: 34 g/dL (ref 30.0–36.0)
MCV: 109.4 fL — ABNORMAL HIGH (ref 78.0–100.0)
PLATELETS: 123 10*3/uL — AB (ref 150–400)
RBC: 4.06 MIL/uL — AB (ref 4.22–5.81)
RDW: 13.5 % (ref 11.5–15.5)
WBC: 5.6 10*3/uL (ref 4.0–10.5)

## 2014-06-30 LAB — COMPREHENSIVE METABOLIC PANEL
ALBUMIN: 3.8 g/dL (ref 3.5–5.2)
ALT: 26 U/L (ref 0–53)
AST: 28 U/L (ref 0–37)
Alkaline Phosphatase: 66 U/L (ref 39–117)
Anion gap: 10 (ref 5–15)
BUN: 12 mg/dL (ref 6–23)
CALCIUM: 9.8 mg/dL (ref 8.4–10.5)
CO2: 26 meq/L (ref 19–32)
CREATININE: 0.97 mg/dL (ref 0.50–1.35)
Chloride: 105 mEq/L (ref 96–112)
GFR calc Af Amer: >90 mL/min (ref >90)
Glucose, Bld: 78 mg/dL (ref 70–99)
Potassium: 4.2 mEq/L (ref 3.7–5.3)
SODIUM: 141 meq/L (ref 137–147)
Total Bilirubin: 0.4 mg/dL (ref 0.3–1.2)
Total Protein: 7.3 g/dL (ref 6.0–8.3)

## 2014-06-30 LAB — I-STAT TROPONIN, ED: Troponin i, poc: 0 ng/mL (ref 0.00–0.08)

## 2014-06-30 LAB — SALICYLATE LEVEL

## 2014-06-30 LAB — ETHANOL

## 2014-06-30 MED ORDER — LORAZEPAM 1 MG PO TABS: 0.0000 mg | ORAL_TABLET | Freq: Two times a day (BID) | ORAL | Status: DC

## 2014-06-30 MED ORDER — LORAZEPAM 1 MG PO TABS: 0.0000 mg | ORAL_TABLET | Freq: Four times a day (QID) | ORAL | Status: DC

## 2014-06-30 MED ORDER — ZOLPIDEM TARTRATE 5 MG PO TABS: 5.0000 mg | ORAL_TABLET | Freq: Every evening | ORAL | Status: DC | PRN

## 2014-06-30 MED ORDER — LAMIVUDINE-ZIDOVUDINE 150-300 MG PO TABS
1.0000 | ORAL_TABLET | Freq: Two times a day (BID) | ORAL | Status: DC
  Administered 2014-06-30 – 2014-07-01 (×2): 1 via ORAL
  Filled 2014-06-30 (×4): qty 1

## 2014-06-30 MED ORDER — IBUPROFEN 200 MG PO TABS: 600.0000 mg | ORAL_TABLET | Freq: Three times a day (TID) | ORAL | Status: DC | PRN

## 2014-06-30 MED ORDER — ADULT MULTIVITAMIN W/MINERALS CH
1.0000 | ORAL_TABLET | Freq: Every day | ORAL | Status: DC
  Administered 2014-06-30 – 2014-07-01 (×2): 1 via ORAL
  Filled 2014-06-30 (×2): qty 1

## 2014-06-30 MED ORDER — LOPINAVIR-RITONAVIR 200-50 MG PO TABS
2.0000 | ORAL_TABLET | Freq: Two times a day (BID) | ORAL | Status: DC
  Administered 2014-06-30 – 2014-07-01 (×2): 2 via ORAL
  Filled 2014-06-30 (×4): qty 2

## 2014-06-30 MED ORDER — MIRTAZAPINE 30 MG PO TABS
15.0000 mg | ORAL_TABLET | Freq: Every day | ORAL | Status: DC
  Administered 2014-06-30: 15 mg via ORAL
  Filled 2014-06-30: qty 1

## 2014-06-30 MED ORDER — ONDANSETRON HCL 4 MG PO TABS: 4.0000 mg | ORAL_TABLET | Freq: Three times a day (TID) | ORAL | Status: DC | PRN

## 2014-06-30 MED ORDER — HYDROCHLOROTHIAZIDE 25 MG PO TABS
12.5000 mg | ORAL_TABLET | Freq: Every day | ORAL | Status: DC
  Administered 2014-07-01: 12.5 mg via ORAL
  Filled 2014-06-30: qty 0.5

## 2014-06-30 MED ORDER — THIAMINE HCL 100 MG/ML IJ SOLN: 100.0000 mg | Freq: Every day | INTRAMUSCULAR | Status: DC

## 2014-06-30 MED ORDER — VITAMIN B-1 100 MG PO TABS
100.0000 mg | ORAL_TABLET | Freq: Every day | ORAL | Status: DC
Start: 2014-06-30 — End: 2014-07-01
  Administered 2014-06-30 – 2014-07-01 (×2): 100 mg via ORAL
  Filled 2014-06-30 (×2): qty 1

## 2014-06-30 MED ORDER — GABAPENTIN 300 MG PO CAPS
600.0000 mg | ORAL_CAPSULE | Freq: Every day | ORAL | Status: DC
  Administered 2014-06-30: 600 mg via ORAL
  Filled 2014-06-30: qty 2

## 2014-06-30 NOTE — ED Notes (Signed)
Patient IVC papers not complete [employer] called and informed that officers need to come out and fix paper work. Dispatch reports that a message was sent to officer and they would come as soon as possible.

## 2014-06-30 NOTE — ED Provider Notes (Signed)
CSN: [id number]     Arrival date & time 06/30/14  1905 History   First MD Initiated Contact with Patient 06/30/14 2110     Chief Complaint  Patient presents with  . Medical Clearance  . Suicidal     (Consider location/radiation/quality/duration/timing/severity/associated sxs/prior Treatment) HPI 53 year old male with a history of schizophrenia and HIV presents with hallucinations, homicidal thoughts, and suicidal thoughts. He was IVC'd by [employer]. The IVC paperwork implies that the patient has been having increasing hallucinations, medical noncompliance, and suicidal and homicidal thoughts. Patient states these hallucinations have been there for years but seem to be worse currently. The patient states that he would commit suicide they did not hurt anyone else but the voices are telling him to hurt everyone. The patient states he has chronic right shoulder pain is not worse than normal. When asked about chest pain he states he has chest pain every day but thinks it's his acid reflux. Denies current chest pain. Had chest pain this AM. No dyspnea.  Past Medical History  Diagnosis Date  . Hypertension   . HIV (human immunodeficiency virus infection)    History reviewed. No pertinent past surgical history. No family history on file. History  Substance Use Topics  . Smoking status: Current Every Day Smoker  . Smokeless tobacco: Not on file  . Alcohol Use: Yes    Review of Systems  Respiratory: Negative for shortness of breath.   Cardiovascular: Positive for chest pain.  Musculoskeletal: Positive for arthralgias (chronic right shoulder pain).  Psychiatric/Behavioral: Positive for suicidal ideas and hallucinations. Negative for self-injury.  All other systems reviewed and are negative.     Allergies  Amoxicillin  Home Medications   Prior to Admission medications   Medication Sig Start Date End Date Taking? Authorizing Provider  gabapentin (NEURONTIN) 600 MG tablet Take 600 mg by  mouth at bedtime.   Yes Historical Provider, MD  hydrochlorothiazide (HYDRODIURIL) 25 MG tablet Take 12.5 mg by mouth daily.   Yes Historical Provider, MD  lamiVUDine-zidovudine (COMBIVIR) 150-300 MG per tablet Take 1 tablet by mouth 2 (two) times daily.   Yes Historical Provider, MD  lopinavir-ritonavir (KALETRA) 200-50 MG per tablet Take 2 tablets by mouth 2 (two) times daily.   Yes Historical Provider, MD  mirtazapine (REMERON) 15 MG tablet Take 15 mg by mouth at bedtime.   Yes Historical Provider, MD  Multiple Vitamin (MULTIVITAMIN WITH MINERALS) TABS tablet Take 1 tablet by mouth daily.   Yes Historical Provider, MD   BP 142/107  Pulse 63  Temp(Src) 98.1 F (36.7 C) (Oral)  Resp 18  SpO2 99% Physical Exam  Nursing note and vitals reviewed. Constitutional: He is oriented to person, place, and time. He appears well-developed and well-nourished.  HENT:  Head: Normocephalic and atraumatic.  Right Ear: External ear normal.  Left Ear: External ear normal.  Nose: Nose normal.  Eyes: Right eye exhibits no discharge. Left eye exhibits no discharge.  Neck: Neck supple.  Cardiovascular: Normal rate, regular rhythm, normal heart sounds and intact distal pulses.   Pulmonary/Chest: Effort normal and breath sounds normal.  Abdominal: Soft. He exhibits no distension. There is no tenderness.  Musculoskeletal: Normal range of motion. He exhibits no edema.  No swelling, tenderness to right shoulder. Normal ROM  Neurological: He is alert and oriented to person, place, and time.  Skin: Skin is warm and dry.  Psychiatric: He has a normal mood and affect. His speech is normal. He is not actively hallucinating. He expresses homicidal  and suicidal ideation.    ED Course  Procedures (including critical care time) Labs Review Labs Reviewed  CBC - Abnormal; Notable for the following:    RBC 4.06 (*)    MCV 109.4 (*)    MCH 37.2 (*)    Platelets 123 (*)    All other components within normal limits   SALICYLATE LEVEL - Abnormal; Notable for the following:    Salicylate Lvl <1.0 (*)    All other components within normal limits  URINE RAPID DRUG SCREEN (HOSP PERFORMED) - Abnormal; Notable for the following:    Cocaine POSITIVE (*)    All other components within normal limits  ACETAMINOPHEN LEVEL  COMPREHENSIVE METABOLIC PANEL  ETHANOL  I-STAT TROPOININ, ED    Imaging Review No results found.   EKG Interpretation   Date/Time:  Tuesday June 30 2014 23:00:43 EDT Ventricular Rate:  53 PR Interval:  154 QRS Duration: 100 QT Interval:  430 QTC Calculation: 403 R Axis:   117 Text Interpretation:  Sinus bradycardia Left posterior fascicular block  Abnormal QRS-T angle, consider primary T wave abnormality Abnormal ECG No  significant change since last tracing Confirmed by Gwynevere Lizana  MD, Alizaya Oshea  ([id number]) on 06/30/2014 11:31:53 PM      MDM   Final diagnoses:  None    Patient IVC'd by [facility] Sessions. Stable here. Describes chronic atypical chest pain, EKG and initial troponin benign. Will get 2nd troponin in 4 hours but I have low suspicion for acute lung or heart pathology. Psych consulted given his psychiatric illness, their disposition pending.    Ephraim Hamburger, MD 06/30/14 [phone]

## 2014-06-30 NOTE — ED Notes (Signed)
Pt brought in by [employer] with IVC papers which reports HI, having hallucinations to kill men, states that's the only way to stop the voices.  Pt is also suicidal with plan to OD on sleeping pills.

## 2014-06-30 NOTE — ED Notes (Signed)
MD at bedside. Regenia Skeeter EDP

## 2014-07-01 ENCOUNTER — Inpatient Hospital Stay (HOSPITAL_COMMUNITY)
Admission: AD | Admit: 2014-07-01 | Discharge: 2014-07-08 | DRG: 885 | Disposition: A | Discharge status: Home / Self Care | Payer: Medicare HMO | Source: Intra-hospital | Attending: Psychiatry | Admitting: Psychiatry

## 2014-07-01 ENCOUNTER — Encounter (HOSPITAL_COMMUNITY): Payer: Self-pay | Admitting: *Deleted

## 2014-07-01 ENCOUNTER — Encounter (HOSPITAL_COMMUNITY): Payer: Self-pay | Admitting: Behavioral Health

## 2014-07-01 DIAGNOSIS — R45851 Suicidal ideations: Secondary | ICD-10-CM | POA: Diagnosis present

## 2014-07-01 DIAGNOSIS — F17200 Nicotine dependence, unspecified, uncomplicated: Secondary | ICD-10-CM

## 2014-07-01 DIAGNOSIS — Z9114 Patient's other noncompliance with medication regimen: Secondary | ICD-10-CM | POA: Diagnosis present

## 2014-07-01 DIAGNOSIS — F419 Anxiety disorder, unspecified: Secondary | ICD-10-CM | POA: Diagnosis present

## 2014-07-01 DIAGNOSIS — F528 Other sexual dysfunction not due to a substance or known physiological condition: Secondary | ICD-10-CM

## 2014-07-01 DIAGNOSIS — F1410 Cocaine abuse, uncomplicated: Secondary | ICD-10-CM | POA: Diagnosis present

## 2014-07-01 DIAGNOSIS — B20 Human immunodeficiency virus [HIV] disease: Secondary | ICD-10-CM | POA: Diagnosis present

## 2014-07-01 DIAGNOSIS — I10 Essential (primary) hypertension: Secondary | ICD-10-CM | POA: Diagnosis present

## 2014-07-01 DIAGNOSIS — F251 Schizoaffective disorder, depressive type: Secondary | ICD-10-CM | POA: Diagnosis not present

## 2014-07-01 DIAGNOSIS — B370 Candidal stomatitis: Secondary | ICD-10-CM

## 2014-07-01 DIAGNOSIS — F17210 Nicotine dependence, cigarettes, uncomplicated: Secondary | ICD-10-CM | POA: Diagnosis present

## 2014-07-01 DIAGNOSIS — F1010 Alcohol abuse, uncomplicated: Secondary | ICD-10-CM

## 2014-07-01 DIAGNOSIS — D012 Carcinoma in situ of rectum: Secondary | ICD-10-CM

## 2014-07-01 DIAGNOSIS — F1420 Cocaine dependence, uncomplicated: Secondary | ICD-10-CM

## 2014-07-01 DIAGNOSIS — R45850 Homicidal ideations: Secondary | ICD-10-CM | POA: Diagnosis present

## 2014-07-01 DIAGNOSIS — F29 Unspecified psychosis not due to a substance or known physiological condition: Secondary | ICD-10-CM

## 2014-07-01 DIAGNOSIS — F1994 Other psychoactive substance use, unspecified with psychoactive substance-induced mood disorder: Secondary | ICD-10-CM

## 2014-07-01 DIAGNOSIS — F209 Schizophrenia, unspecified: Secondary | ICD-10-CM | POA: Diagnosis present

## 2014-07-01 DIAGNOSIS — F1020 Alcohol dependence, uncomplicated: Secondary | ICD-10-CM

## 2014-07-01 DIAGNOSIS — F200 Paranoid schizophrenia: Secondary | ICD-10-CM

## 2014-07-01 LAB — TROPONIN I

## 2014-07-01 MED ORDER — ALUM & MAG HYDROXIDE-SIMETH 200-200-20 MG/5ML PO SUSP: 30.0000 mL | ORAL | Status: DC | PRN

## 2014-07-01 MED ORDER — GABAPENTIN 300 MG PO CAPS
600.0000 mg | ORAL_CAPSULE | Freq: Every day | ORAL | Status: DC
  Administered 2014-07-01 – 2014-07-04 (×4): 600 mg via ORAL
  Filled 2014-07-01 (×7): qty 2

## 2014-07-01 MED ORDER — LAMIVUDINE-ZIDOVUDINE 150-300 MG PO TABS
1.0000 | ORAL_TABLET | Freq: Two times a day (BID) | ORAL | Status: DC
  Administered 2014-07-01 – 2014-07-08 (×14): 1 via ORAL
  Filled 2014-07-01 (×20): qty 1

## 2014-07-01 MED ORDER — MIRTAZAPINE 15 MG PO TABS
15.0000 mg | ORAL_TABLET | Freq: Every day | ORAL | Status: DC
  Administered 2014-07-01 – 2014-07-03 (×3): 15 mg via ORAL
  Filled 2014-07-01 (×7): qty 1

## 2014-07-01 MED ORDER — TRAZODONE HCL 50 MG PO TABS: 50.0000 mg | ORAL_TABLET | Freq: Every evening | ORAL | Status: DC | PRN

## 2014-07-01 MED ORDER — ONDANSETRON HCL 4 MG PO TABS: 4.0000 mg | ORAL_TABLET | Freq: Three times a day (TID) | ORAL | Status: DC | PRN

## 2014-07-01 MED ORDER — LOPINAVIR-RITONAVIR 200-50 MG PO TABS
2.0000 | ORAL_TABLET | Freq: Two times a day (BID) | ORAL | Status: DC
  Administered 2014-07-01 – 2014-07-08 (×14): 2 via ORAL
  Filled 2014-07-01 (×19): qty 2

## 2014-07-01 MED ORDER — MAGNESIUM HYDROXIDE 400 MG/5ML PO SUSP: 30.0000 mL | Freq: Every day | ORAL | Status: DC | PRN

## 2014-07-01 MED ORDER — HYDROCHLOROTHIAZIDE 25 MG PO TABS
12.5000 mg | ORAL_TABLET | Freq: Every day | ORAL | Status: DC
Start: 2014-07-02 — End: 2014-07-05
  Administered 2014-07-02 – 2014-07-05 (×4): 12.5 mg via ORAL
  Filled 2014-07-01: qty 1
  Filled 2014-07-01 (×6): qty 0.5

## 2014-07-01 MED ORDER — ADULT MULTIVITAMIN W/MINERALS CH
1.0000 | ORAL_TABLET | Freq: Every day | ORAL | Status: DC
  Administered 2014-07-02 – 2014-07-08 (×7): 1 via ORAL
  Filled 2014-07-01 (×9): qty 1

## 2014-07-01 MED ORDER — ACETAMINOPHEN 325 MG PO TABS
650.0000 mg | ORAL_TABLET | Freq: Four times a day (QID) | ORAL | Status: DC | PRN
  Administered 2014-07-03 – 2014-07-06 (×4): 650 mg via ORAL
  Filled 2014-07-01 (×4): qty 2

## 2014-07-01 NOTE — Tx Team (Signed)
Initial Interdisciplinary Treatment Plan   PATIENT STRESSORS: Financial difficulties Marital or family conflict Medication change or noncompliance Occupational concerns Substance abuse   PROBLEM LIST: Problem List/Patient Goals Date to be addressed Date deferred Reason deferred Estimated date of resolution  Psychosis "I hear the voices and I just want them to stop." 07/01/2014     Suicidal ideations "The voice are telling me to harm myself." 07/01/2014     "I want to learn to love myself." 07/01/2014     Depression 07/01/2014     "I want to make positive decisions do I dont turn to drugs/" 07/01/2014                              DISCHARGE CRITERIA:  Ability to meet basic life and health needs Adequate post-discharge living arrangements Improved stabilization in mood, thinking, and/or behavior Need for constant or close observation no longer present Safe-care adequate arrangements made  PRELIMINARY DISCHARGE PLAN: Attend aftercare/continuing care group Placement in alternative living arrangements  PATIENT/FAMIILY INVOLVEMENT: This treatment plan has been presented to and reviewed with the patient, [name] Herring.  The patient and family have been given the opportunity to ask questions and make suggestions.  Pricilla Riffle M 07/01/2014, 7:55 PM

## 2014-07-01 NOTE — Progress Notes (Addendum)
53 y/o male who presents involuntarily for depression, SI. Psychosis and substance abuse.  Patient states he has been hearing voices telling him to harm himself.  Patient states he has had suicide attempts in the past.  Patient states he takes medication but states he is unsure if they are working.  Patient states he used cocaine 1 gram, and drank 1 pint of alcohol on 06/27/2014 and most of his money for the month is gone.  Patient states he is currently living in a boarding house in [facility] in a bad neighborhood and does not like it.  Patient states he has a positive support system.  Patient states he hs tried to take his prescription medications regularly.  Patient states he would like to learn to love himself.  Patient denies physical, sexual, and verbal abuse.  Patient states he is Passive SI, hearing auditory hallucinations to harm self and HI towards no one specific but states if anyone messes with his he would harm them.  Patient verbally contracts for safety.  Consents obtained, fall safety plan explained and patient verbalized understanding.  Food and fluids offered and patient accepted both.  Patient had several bags during belongings search.  Patient has locker #38, and #40.  Patient has one additional bag in the corner a gray and black book bag. Patient escorted and oriented to the unit.  Patient offered no additional questions or concerns.

## 2014-07-01 NOTE — [facility] Group Notes (Signed)
Adult Psychoeducational Group Note  Date:  07/01/2014 Time:  9:03 PM  Group Topic/Focus:  Wrap-Up Group:   The focus of this group is to help patients review their daily goal of treatment and discuss progress on daily workbooks.  Participation Level:  Active  Participation Quality:  Appropriate  Affect:  Appropriate  Cognitive:  Appropriate  Insight: Appropriate  Engagement in Group:  Engaged  Modes of Intervention:  Discussion  Additional Comments:  [name] Herring is Herring new admit.  He introduced himself to the group.  He stated he is recently divorced, blames himself for the his problems and his divorce.  He has 4 grown children and 6 grandchildren.  He also stated that he had been hearing voices and was trying to ignore them but came in for help when he could no longer deal with it.  [name] Herring 07/01/2014, 9:03 PM
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
## 2014-07-01 NOTE — Consult Note (Signed)
Face to face evaluation and I agree with this note 

## 2014-07-01 NOTE — Consult Note (Signed)
[facility] Face-to-Face Psychiatry Consult   Reason for Consult:  Hearing voices Referring Physician:  EDP  GURSHAAN MATSUOKA is an 53 y.o. male. Total Time spent with patient: 45 minutes  Assessment: AXIS I:  Psychotic Disorder NOS, Substance Abuse and Substance Induced Mood Disorder AXIS II:  Deferred AXIS III:   Past Medical History  Diagnosis Date  . Hypertension   . HIV (human immunodeficiency virus infection)   . Schizophrenia   . Depression    AXIS IV:  other psychosocial or environmental problems and problems related to social environment AXIS V:  21-30 behavior considerably influenced by delusions or hallucinations OR serious impairment in judgment, communication OR inability to function in almost all areas  Plan:  Recommend psychiatric Inpatient admission when medically cleared.  Subjective:    HPI:   BRAZEN DOMANGUE is a 53 y.o. male patient presents to [facility] with complaints of auditory hallucinations.  Patient states that he has a long history of hallucinations but it is usually controled.  "I always hear voices, they never go away.  But not to this extreme.  Patient has outpatient services with Dr. Lilyan Punt at [facility] and sees monthly. Patient states that the voices are telling him to hurt himself and other.     HPI Elements:   Location:  Psychotic disorder. Quality:  hearing voices. Severity:  voices telling him to hurt himself and others. Timing:  Worsening over the last month. Review of Systems  Psychiatric/Behavioral: Positive for depression, suicidal ideas, hallucinations and substance abuse.  All other systems reviewed and are negative.  No family history on file.  Past Psychiatric History: Past Medical History  Diagnosis Date  . Hypertension   . HIV (human immunodeficiency virus infection)   . Schizophrenia   . Depression     reports that he has been smoking.  He does not have any smokeless tobacco history on file. He reports that he drinks alcohol. He reports that  he uses illicit drugs (Cocaine). No family history on file. Family History Substance Abuse: No Family Supports: No (None reported ) Living Arrangements: Alone Can pt return to current living arrangement?: Yes Abuse/Neglect [facility]) Physical Abuse: Denies Verbal Abuse: Denies Sexual Abuse: Denies Allergies:   Allergies  Allergen Reactions  . Amoxicillin Rash    ACT Assessment Complete:  Yes:    Educational Status    Risk to Self: Risk to self with the past 6 months Suicidal Ideation: Yes-Currently Present Suicidal Intent: Yes-Currently Present Is patient at risk for suicide?: Yes Suicidal Plan?: Yes-Currently Present Specify Current Suicidal Plan: Overdose on pills  Access to Means: Yes Specify Access to Suicidal Means: Medications, Pills  What has been your use of drugs/alcohol within the last 12 months?: Absuing: cocaine, alcohol  Previous Attempts/Gestures: Yes How many times?: 12 Other Self Harm Risks: None  Triggers for Past Attempts: Unpredictable Intentional Self Injurious Behavior: None Family Suicide History: No Recent stressful life event(s): Recent negative physical changes (Chronic mental health ) Persecutory voices/beliefs?: No Depression: Yes Depression Symptoms: Loss of interest in usual pleasures;Feeling worthless/self pity Substance abuse history and/or treatment for substance abuse?: Yes Suicide prevention information given to non-admitted patients: Not applicable  Risk to Others: Risk to Others within the past 6 months Homicidal Ideation: Yes-Currently Present Thoughts of Harm to Others: Yes-Currently Present Comment - Thoughts of Harm to Others: Random people  Current Homicidal Intent: No-Not Currently/Within Last 6 Months Current Homicidal Plan: Yes-Currently Present Describe Current Homicidal Plan: Use Arsenic to posion people  Access  to Homicidal Means: No Identified Victim: Random people  History of harm to others?: No Assessment of Violence: None  Noted Violent Behavior Description: None  Does patient have access to weapons?: No Criminal Charges Pending?: No Does patient have a court date: No  Abuse: Abuse/Neglect Assessment (Assessment to be complete while patient is alone) Physical Abuse: Denies Verbal Abuse: Denies Sexual Abuse: Denies Exploitation of patient/patient's resources: Denies Self-Neglect: Denies  Prior Inpatient Therapy: Prior Inpatient Therapy Prior Inpatient Therapy: Yes Prior Therapy Dates: 2002,2009, 2011, 2010,2005 Prior Therapy Facilty/Provider(s): [facility], [place], [facility], [place], [facility]  Reason for Treatment: SI/Depression/SA   Prior Outpatient Therapy: Prior Outpatient Therapy Prior Outpatient Therapy: Yes Prior Therapy Dates: Current  Prior Therapy Facilty/Provider(s): Dr. Marland KitchenB"-[place]  Reason for Treatment: Med Mgt   Additional Information: Additional Information 1:1 In Past 12 Months?: No CIRT Risk: No Elopement Risk: No Does patient have medical clearance?: Yes      Objective: Blood pressure 140/85, pulse 53, temperature 98.7 F (37.1 C), temperature source Oral, resp. rate 16, SpO2 99.00%.There is no weight on file to calculate BMI. Results for orders placed during the hospital encounter of 06/30/14 (from the past 72 hour(s))  ACETAMINOPHEN LEVEL     Status: None   Collection Time    06/30/14  7:50 PM      Result Value Ref Range   Acetaminophen (Tylenol), Serum <15.0  10 - 30 ug/mL   Comment:            THERAPEUTIC CONCENTRATIONS VARY     SIGNIFICANTLY. A RANGE OF 10-30     ug/mL MAY BE AN EFFECTIVE     CONCENTRATION FOR MANY PATIENTS.     HOWEVER, SOME ARE BEST TREATED     AT CONCENTRATIONS OUTSIDE THIS     RANGE.     ACETAMINOPHEN CONCENTRATIONS     >150 ug/mL AT 4 HOURS AFTER     INGESTION AND >50 ug/mL AT 12     HOURS AFTER INGESTION ARE     OFTEN ASSOCIATED WITH TOXIC     REACTIONS.  CBC     Status: Abnormal   Collection Time    06/30/14  7:50 PM      Result Value  Ref Range   WBC 5.6  4.0 - 10.5 K/uL   RBC 4.06 (*) 4.22 - 5.81 MIL/uL   Hemoglobin 15.1  13.0 - 17.0 g/dL   HCT 44.4  39.0 - 52.0 %   MCV 109.4 (*) 78.0 - 100.0 fL   MCH 37.2 (*) 26.0 - 34.0 pg   MCHC 34.0  30.0 - 36.0 g/dL   RDW 13.5  11.5 - 15.5 %   Platelets 123 (*) 150 - 400 K/uL  COMPREHENSIVE METABOLIC PANEL     Status: None   Collection Time    06/30/14  7:50 PM      Result Value Ref Range   Sodium 141  137 - 147 mEq/L   Potassium 4.2  3.7 - 5.3 mEq/L   Chloride 105  96 - 112 mEq/L   CO2 26  19 - 32 mEq/L   Glucose, Bld 78  70 - 99 mg/dL   BUN 12  6 - 23 mg/dL   Creatinine, Ser 0.97  0.50 - 1.35 mg/dL   Calcium 9.8  8.4 - 10.5 mg/dL   Total Protein 7.3  6.0 - 8.3 g/dL   Albumin 3.8  3.5 - 5.2 g/dL   AST 28  0 - 37 U/L  ALT 26  0 - 53 U/L   Alkaline Phosphatase 66  39 - 117 U/L   Total Bilirubin 0.4  0.3 - 1.2 mg/dL   GFR calc non Af Amer >90  >90 mL/min   GFR calc Af Amer >90  >90 mL/min   Comment: (NOTE)     The eGFR has been calculated using the CKD EPI equation.     This calculation has not been validated in all clinical situations.     eGFR's persistently <90 mL/min signify possible Chronic Kidney     Disease.   Anion gap 10  5 - 15  ETHANOL     Status: None   Collection Time    06/30/14  7:50 PM      Result Value Ref Range   Alcohol, Ethyl (B) <11  0 - 11 mg/dL   Comment:            LOWEST DETECTABLE LIMIT FOR     SERUM ALCOHOL IS 11 mg/dL     FOR MEDICAL PURPOSES ONLY  SALICYLATE LEVEL     Status: Abnormal   Collection Time    06/30/14  7:50 PM      Result Value Ref Range   Salicylate Lvl <6.7 (*) 2.8 - 20.0 mg/dL  URINE RAPID DRUG SCREEN (HOSP PERFORMED)     Status: Abnormal   Collection Time    06/30/14  8:21 PM      Result Value Ref Range   Opiates NONE DETECTED  NONE DETECTED   Cocaine POSITIVE (*) NONE DETECTED   Benzodiazepines NONE DETECTED  NONE DETECTED   Amphetamines NONE DETECTED  NONE DETECTED   Tetrahydrocannabinol NONE DETECTED   NONE DETECTED   Barbiturates NONE DETECTED  NONE DETECTED   Comment:            DRUG SCREEN FOR MEDICAL PURPOSES     ONLY.  IF CONFIRMATION IS NEEDED     FOR ANY PURPOSE, NOTIFY LAB     WITHIN 5 DAYS.                LOWEST DETECTABLE LIMITS     FOR URINE DRUG SCREEN     Drug Class       Cutoff (ng/mL)     Amphetamine      1000     Barbiturate      200     Benzodiazepine   893     Tricyclics       810     Opiates          300     Cocaine          300     THC              51  I-STAT TROPOININ, ED     Status: None   Collection Time    06/30/14  9:48 PM      Result Value Ref Range   Troponin i, poc 0.00  0.00 - 0.08 ng/mL   Comment 3            Comment: Due to the release kinetics of cTnI,     a negative result within the first hours     of the onset of symptoms does not rule out     myocardial infarction with certainty.     If myocardial infarction is still suspected,     repeat the test at appropriate intervals.  TROPONIN I     Status:  None   Collection Time    07/01/14  8:00 AM      Result Value Ref Range   Troponin I <0.30  <0.30 ng/mL   Comment:            Due to the release kinetics of cTnI,     a negative result within the first hours     of the onset of symptoms does not rule out     myocardial infarction with certainty.     If myocardial infarction is still suspected,     repeat the test at appropriate intervals.   Labs are reviewed UDS positive cocaine.  Medication reviewed and no changes made.  Current Facility-Administered Medications  Medication Dose Route Frequency Provider Last Rate Last Dose  . gabapentin (NEURONTIN) capsule 600 mg  600 mg Oral QHS Ephraim Hamburger, MD   600 mg at 06/30/14 2203  . hydrochlorothiazide (HYDRODIURIL) tablet 12.5 mg  12.5 mg Oral Daily Ephraim Hamburger, MD   12.5 mg at 07/01/14 1034  . ibuprofen (ADVIL,MOTRIN) tablet 600 mg  600 mg Oral Q8H PRN Ephraim Hamburger, MD      . lamiVUDine-zidovudine (COMBIVIR) 150-300 MG per tablet  1 tablet  1 tablet Oral BID Ephraim Hamburger, MD   1 tablet at 07/01/14 1036  . lopinavir-ritonavir (KALETRA) 200-50 MG per tablet 2 tablet  2 tablet Oral BID Ephraim Hamburger, MD   2 tablet at 07/01/14 1037  . LORazepam (ATIVAN) tablet 0-4 mg  0-4 mg Oral 4 times per day Ephraim Hamburger, MD       Followed by  . [START ON 07/03/2014] LORazepam (ATIVAN) tablet 0-4 mg  0-4 mg Oral Q12H Ephraim Hamburger, MD      . mirtazapine (REMERON) tablet 15 mg  15 mg Oral QHS Ephraim Hamburger, MD   15 mg at 06/30/14 2201  . multivitamin with minerals tablet 1 tablet  1 tablet Oral Daily Ephraim Hamburger, MD   1 tablet at 07/01/14 1034  . ondansetron (ZOFRAN) tablet 4 mg  4 mg Oral Q8H PRN Ephraim Hamburger, MD      . thiamine (VITAMIN B-1) tablet 100 mg  100 mg Oral Daily Ephraim Hamburger, MD   100 mg at 07/01/14 1034   Or  . thiamine (B-1) injection 100 mg  100 mg Intravenous Daily Ephraim Hamburger, MD      . zolpidem (AMBIEN) tablet 5 mg  5 mg Oral QHS PRN Ephraim Hamburger, MD       Current Outpatient Prescriptions  Medication Sig Dispense Refill  . gabapentin (NEURONTIN) 600 MG tablet Take 600 mg by mouth at bedtime.      . hydrochlorothiazide (HYDRODIURIL) 25 MG tablet Take 12.5 mg by mouth daily.      Marland Kitchen lamiVUDine-zidovudine (COMBIVIR) 150-300 MG per tablet Take 1 tablet by mouth 2 (two) times daily.      Marland Kitchen lopinavir-ritonavir (KALETRA) 200-50 MG per tablet Take 2 tablets by mouth 2 (two) times daily.      . mirtazapine (REMERON) 15 MG tablet Take 15 mg by mouth at bedtime.      . Multiple Vitamin (MULTIVITAMIN WITH MINERALS) TABS tablet Take 1 tablet by mouth daily.        Psychiatric Specialty Exam:     Blood pressure 140/85, pulse 53, temperature 98.7 F (37.1 C), temperature source Oral, resp. rate 16, SpO2 99.00%.There is no weight on file to calculate BMI.  General Appearance:  Disheveled  Eye Contact::  None  Speech:  Clear and Coherent and Normal Rate  Volume:  Decreased  Mood:  Depressed   Affect:  Congruent and Flat  Thought Process:  Circumstantial and Goal Directed  Orientation:  Full (Time, Place, and Person)  Thought Content:  Hallucinations: Auditory Command:  voices telling him to hurt himself and others and Rumination  Suicidal Thoughts:  Yes.  with intent/plan  Homicidal Thoughts:  No.  He doesn't want to hurt any one; but voices are telling him to  Memory:  Immediate;   Good Recent;   Good Remote;   Good  Judgement:  Good  Insight:  Lacking  Psychomotor Activity:  Decreased  Concentration:  Fair  Recall:  Good  Fund of Knowledge:Fair  Language: Good  Akathisia:  No  Handed:  Right  AIMS (if indicated):     Assets:  Communication Skills Desire for Improvement  Sleep:      Musculoskeletal: Strength & Muscle Tone: within normal limits Gait & Station: Did not see patient ambulate   Patient leans: N/A  Treatment Plan Summary: Daily contact with patient to assess and evaluate symptoms and progress in treatment Medication management Inpatient treatment recommended  Shavaughn Seidl, FNP-BC 07/01/2014 2:24 PM

## 2014-07-01 NOTE — ED Notes (Signed)
Lab tech Vikki contacted to draw lab.

## 2014-07-01 NOTE — ED Notes (Signed)
Pt being sent to [facility]. Report called to Loletta Specter RN. [employer] called for transport.

## 2014-07-01 NOTE — ED Notes (Signed)
Pt reports passive si with no specific plan. He states that he has command voices to hurt others by poisoning them. Pt has been asleep much of the morning. He rates anxiety as an 8, depression and hopelessness as a 10 on 1-10 scale with 10 being the most. Safety maintained in the [facility].

## 2014-07-02 DIAGNOSIS — F1010 Alcohol abuse, uncomplicated: Secondary | ICD-10-CM

## 2014-07-02 DIAGNOSIS — F209 Schizophrenia, unspecified: Principal | ICD-10-CM

## 2014-07-02 DIAGNOSIS — I10 Essential (primary) hypertension: Secondary | ICD-10-CM

## 2014-07-02 DIAGNOSIS — Z21 Asymptomatic human immunodeficiency virus [HIV] infection status: Secondary | ICD-10-CM

## 2014-07-02 MED ORDER — RISPERIDONE 2 MG PO TABS
2.0000 mg | ORAL_TABLET | Freq: Every day | ORAL | Status: DC
  Administered 2014-07-02: 2 mg via ORAL
  Filled 2014-07-02 (×4): qty 1

## 2014-07-02 NOTE — Progress Notes (Signed)
Patient ID: [name] Herring, male   DOB: 03-Dec-1960, 53 y.o.   MRN: [id number] D: Client visible on unit, noted in the dayroom watching TV, reports AVH, but say his main problem is alcohol use and no place to stay. "they suppose to be seeing me to a two week program" client is pleasant and interacts appropriately.  A: Writer introduced self to client, provided emotional support, encouraged him to follow up with CM about discharge plans. Writer reviewed medications and administered accordingly (see MAR). Staff will monitor q77min for safety. R: Client is safe on the unit, attended group.

## 2014-07-02 NOTE — [facility] Suicide Risk Assessment (Signed)
   Nursing information obtained from:  Patient Demographic factors:  Male;Unemployed;Low socioeconomic status;Gay, lesbian, or bisexual orientation Current Mental Status:  Suicidal ideation indicated by patient;Self-harm thoughts Loss Factors:  Financial problems / change in socioeconomic status Historical Factors:  Prior suicide attempts;Family history of suicide Risk Reduction Factors:  Responsible for children under 53 years of age;Sense of responsibility to family;Positive social support;Religious beliefs about death Total Time spent with patient: 45 minutes  CLINICAL FACTORS:  Schizophrenia, alcohol abuse  Psychiatric Specialty Exam: Physical Exam  ROS  Blood pressure 132/97, pulse 62, temperature 97.8 F (36.6 C), temperature source Oral, resp. rate 18, height 5\' 10"  (1.778 m), weight 63.504 kg (140 lb).Body mass index is 20.09 kg/(m^2).  SEE ADMIT NOTE MSE  COGNITIVE FEATURES THAT CONTRIBUTE TO RISK:  Closed-mindedness    SUICIDE RISK:  MODERATE.  PLAN OF CARE: Patient will be admitted to inpatient psychiatric unit for stabilization and safety. Will provide and encourage milieu participation. Provide medication management and maked adjustments as needed.  Will follow daily.    I certify that inpatient services furnished can reasonably be expected to improve the patient's condition.  COBOS, [place] 07/02/2014, 5:58 PM
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
## 2014-07-02 NOTE — Progress Notes (Signed)
D:  Patient denied SI and HI, contracts for safety.  Stated he has auditory and visual hallucinations today.  Patient denied pain.  A:  Medications administered per MD orders.  Emotional support and encouragement given patient. R:  Safety maintained with 15 minute checks.

## 2014-07-02 NOTE — Progress Notes (Signed)
Patient ID: [name] Herring, male   DOB: 1961/03/11, 53 y.o.   MRN: [id number] PER STATE REGULATIONS 482.30  THIS CHART WAS REVIEWED FOR MEDICAL NECESSITY WITH RESPECT TO THE PATIENT'S ADMISSION/ DURATION OF STAY.  NEXT REVIEW DATE:  07/05/2014 Chauncy Lean, RN, BSN CASE MANAGER

## 2014-07-02 NOTE — [facility] Suicide Risk Assessment (Signed)
[place] INPATIENT:  Family/Significant Other Suicide Prevention Education  Suicide Prevention Education:  Patient Refusal for Family/Significant Other Suicide Prevention Education: The patient RONITH BERTI has refused to provide written consent for family/significant other to be provided Family/Significant Other Suicide Prevention Education during admission and/or prior to discharge.  Physician notified.  SPE completed with pt. SPI pamphlet provided to pt and he was encouraged to share information with support network, ask questions, and talk about any concerns relating to SPE. Pt stated that he did not want his children or exwife to know that he was in the hospital at this time and refused to consent to family contact. He identified his kids and exwife as his only social supports.   Smart, Rayan Ines LCSWA 07/02/2014, 4:05 PM
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
## 2014-07-02 NOTE — BHH Counselor (Signed)
Adult Comprehensive Assessment  Patient ID: [name] Herring, male   DOB: Dec 14, 1960, 53 y.o.   MRN: [id number]  Information Source: Information source: Patient  Current Stressors:  Bereavement / Loss: no recent losses.  Medical issues: Hypertension and HIV  Living/Environment/Situation:  Living Arrangements: Alone Living conditions (as described by patient or guardian): homeless for past two days; prior to this, pt was living in boarding home in [facility].  How long has patient lived in current situation?: 2 days homeless, boarding home for past three months  What is atmosphere in current home: Temporary  Family History:  Marital status: Divorced Divorced, when?: 2 years ago What types of issues is patient dealing with in the relationship?: we had some communication problems. Now we get along better than before. We were married for 29 years and have four kids together. She is by far, my biggest support but I don't want her to know that I am here.  Additional relationship information: n/a  Does patient have children?: Yes How many children?: 4 How is patient's relationship with their children?: three boys and a girl. youngest is at [employer]. I have a great relationship with my kids. They live in [place] but not closeby.   Childhood History:  By whom was/is the patient raised?: Both parents;Mother Additional childhood history information: my mom mostly raised me. my parents were married for most of my childhood but dad disappeared alot. He was an alcoholic but I didn't really understand that. He was always kind and funny but my mom couldn't handle it and eventually divorced him. Description of patient's relationship with caregiver when they were a child: close to mother. close to father when he was around Patient's description of current relationship with people who raised him/her: close to parents until their death (both died in 39).  Does patient have siblings?: Yes Number of Siblings:  2 Description of patient's current relationship with siblings: older brother and younger brother-both died in 37. "I had a mental breakdown after that. I lost my whole family in just two years."  Did patient suffer any verbal/emotional/physical/sexual abuse as a child?: No Did patient suffer from severe childhood neglect?: No Has patient ever been sexually abused/assaulted/raped as an adolescent or adult?: No Was the patient ever a victim of a crime or a disaster?: No Witnessed domestic violence?: No Has patient been effected by domestic violence as an adult?: No  Education:  Highest grade of school patient has completed: master's degree in education  Currently a student?: No Learning disability?: No  Employment/Work Situation:   Employment situation: On disability Why is patient on disability: schizophrenia How long has patient been on disability: 1999 Patient's job has been impacted by current illness: No (n/a) What is the longest time patient has a held a job?: 20 years  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 But only lately, when I've felt depressed. Same with crack cocaine-I usually relapse when the voices come back and the depression comes back. I use about a gram per day. I relapsed on Oct 3rd after 47mo of sobriety."  If attempted suicide, did drugs/alcohol play a role in this?: Yes (Once, I was on  antipsychotic drugs and my family told me that they walked in on me trying to hang myself. I have no recollection of this. ) Alcohol/Substance Abuse Treatment Hx: Past Tx, Inpatient;Past Tx, Outpatient If yes, describe treatment: I see Dr. B in [place]/[facility] for med  managment.  Has alcohol/substance abuse ever caused legal problems?: No  Social Support System:   Patient's Community Support System: Poor Describe Community Support System: I don't have many friends. My family (kids) and exwife are my main supports. Type of faith/religion: christian How does patient's faith help to cope with current illness?: n/a   Leisure/Recreation:   Leisure and Hobbies: talking to my kids, reading  Strengths/Needs:   What things does the patient do well?: kind, dealing with stress, insightful, learning  In what areas does patient struggle / problems for patient: coping with symtpoms, managing my symptoms   Discharge Plan:   Does patient have access to transportation?: Yes (bus/walk) Will patient be returning to same living situation after discharge?: Yes (I plan to go the [facility] until my check comes in if I am not accepted at [facility]) Currently receiving community mental health services: Yes (From Whom) (Dr. B at [facility] in [place], but I want to switch to [place]. I'd rather stay out here) If no, would patient like referral for services when discharged?: Yes (What county?) ([place] want [facility] IOP if I can't get into [facility]. Maybe [facility] o[facility]p) Does patient have financial barriers related to discharge medications?: No (Medicare)  Summary/Recommendations:    Pt is 53 year old male who is currently homeless in [place]. Pt presents involuntarily to [facility] due to SI/HI, AH, substance abuse (alcohol and crack cocaine), and for medication stabilization. Currently, pt reports no HI, with passive SI and some AH. Pt states that he relapsed on alcohol (1 pint every few days) and crack cocaine (1gram per day) on Oct 3rd. He identifies his triggers as "family stress, financial issues, and return of voices." Pt reports med compliance but states that they were not working well. He sees "Dr. Jacinto Reap" at [facility] in [facility] for med management. Recommendations for pt include:  crisis stabilization, therapeutic milieu, encourage group attendance and participation, medication management for mood stabilization, and development of comprehensive mental wellness/sobriety plan. Pt is open to [facility] referral/plans to stay at [facility] until disability check comes on Nov 3rd. He is wanting to join [facility] at [facility] if possible (pt states that he has medicare). Long term, he plans to move to [facility] when enough money is saved.    Smart, Mariavictoria Nottingham LCSWA 07/02/2014

## 2014-07-02 NOTE — BHH Group Notes (Signed)
[facility] Group Notes:  (Counselor/Nursing/MHT/Case Management/Adjunct)  07/02/2014 1:15PM  Type of Therapy:  Group Therapy  Participation Level:  Active  Participation Quality:  Appropriate  Affect:  Flat  Cognitive:  Oriented  Insight:  Improving  Engagement in Group:  Limited  Engagement in Therapy:  Limited  Modes of Intervention:  Discussion, Exploration and Socialization  Summary of Progress/Problems: The topic for group was balance in life.  Pt participated in the discussion about when their life was in balance and out of balance and how this feels.  Pt discussed ways to get back in balance and short term goals they can work on to get where they want to be. [name] Herring was pleasant and engaged throughout.  Mood is good.  He stated that there is both good and bad anger.  Good anger is productive, and bad anger is destructive.  "My voices mix everything up, and then I'm not sure what is true and what is false."  States he can get to a good space by talking to his ex wife.  "She is my best support."  Then went on to say that he does not want her to know that he is here. Admitted to depression.  "Then I drink, and it takes me to a worse place."  Expressed relief that he is here to get back on track.   Trish Mage 07/02/2014 3:57 PM

## 2014-07-03 DIAGNOSIS — F1020 Alcohol dependence, uncomplicated: Secondary | ICD-10-CM

## 2014-07-03 DIAGNOSIS — F23 Brief psychotic disorder: Secondary | ICD-10-CM

## 2014-07-03 DIAGNOSIS — B20 Human immunodeficiency virus [HIV] disease: Secondary | ICD-10-CM

## 2014-07-03 DIAGNOSIS — F1420 Cocaine dependence, uncomplicated: Secondary | ICD-10-CM

## 2014-07-03 MED ORDER — BENZTROPINE MESYLATE 0.5 MG PO TABS
0.5000 mg | ORAL_TABLET | Freq: Every day | ORAL | Status: DC
  Administered 2014-07-03 – 2014-07-04 (×2): 0.5 mg via ORAL
  Filled 2014-07-03 (×5): qty 1

## 2014-07-03 MED ORDER — RISPERIDONE 3 MG PO TABS
3.0000 mg | ORAL_TABLET | Freq: Every day | ORAL | Status: DC
  Administered 2014-07-03 – 2014-07-04 (×2): 3 mg via ORAL
  Filled 2014-07-03 (×4): qty 1

## 2014-07-03 NOTE — Progress Notes (Signed)
[facility] Group Notes:  (Nursing/MHT/Case Management/Adjunct)  Date:  07/03/2014  Time:  11:17 PM  Type of Therapy:  Group Therapy  Participation Level:  Minimal  Participation Quality:  Appropriate  Affect:  Appropriate  Cognitive:  Appropriate  Insight:  Appropriate  Engagement in Group:  Engaged  Modes of Intervention:  Socialization and Support  Summary of Progress/Problems: Pt. Participated in group discussion.  Pt. Stated walking is a coping skill he use to prevent relapse.  Lanell Persons 07/03/2014, 11:17 PM

## 2014-07-03 NOTE — Plan of Care (Signed)
Problem: Ineffective individual coping Goal: STG: Patient will remain free from self harm Outcome: Progressing Pt has remained free from self harm however does endorse passive SI due to command AH. Pt contracts for safety.   Problem: Alteration in thought process Goal: STG-Patient does not respond to command hallucinations Outcome: Progressing Patient does not respond to [facility] and has awareness that this is part of his illness and not reality.

## 2014-07-03 NOTE — Progress Notes (Addendum)
Patient is flat and depressed in affect with congruent mood. He continues to endorse command AH to harm self and others however verbally contracts for safety. No VH. Discussed plan of care with MD and treatment team. Pt given support, encouragement. Medicated per orders without difficulty. Pt was given tylenol for R shoulder pain of a 9/10. Will reassess at the completion of group patient is presently attending. He remains safe on unit. Jamie Kato

## 2014-07-03 NOTE — Progress Notes (Signed)
Patient ID: [name] Herring, male   DOB: [birth year], 53 y.o.   MRN: [id number] D: Client seen in dayroom, quite but interacts appropriately. Client reports "voice still there", but notes some harmful thoughts, "but not as bad." client contracts for safety.  Client says prior to admission he had been off his Risperdal and Prozac. A: Writer provides emotional support, reviews medications and administers as ordered. Staff will monitor q46min for safety. R: client is safe on the unit, attended group.

## 2014-07-03 NOTE — BHH Group Notes (Signed)
[facility] LCSW Group Therapy  07/03/2014  1:05 PM  Type of Therapy:  Group therapy  Participation Level:  Active  Participation Quality:  Attentive  Affect:  Flat  Cognitive:  Oriented  Insight:  Limited  Engagement in Therapy:  Limited  Modes of Intervention:  Discussion, Socialization  Summary of Progress/Problems:  Chaplain was here to lead a group on themes of hope and courage. " Courage comes from both in [place] and outside as well."    Talked about his faith, as well as the support of others.  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  "I tell people, you will like me when I take my medication, but when I don't take my medication, or when I am drinking, I turn into a monster."  Stated he tries to stay focused on his goals, which helps him stay on medication and stay stober.  "I need continued courage to defeat my demons."  Trish Mage 07/03/2014 10:45 AM

## 2014-07-03 NOTE — BHH Group Notes (Signed)
[facility] LCSW Aftercare Discharge Planning Group Note   07/03/2014 10:43 AM  Participation Quality:  Engaged  Mood/Affect:  Flat  Depression Rating:  5  Anxiety Rating:  5  Thoughts of Suicide:  No Will you contract for safety?   NA  Current AVH:  Yes  Plan for Discharge/Comments:  [name] Herring is pleasant and optimistic today.  He states while he is still experiencing AH/VH, it is diminishing "since the Dr started me on a med last night that was helpful many years ago."  He slept well, and states the CSW is working with him on locating a Dr in [place].  Transportation Means: family  Supports: family  [name], [name] Herring

## 2014-07-03 NOTE — Progress Notes (Signed)
[facility] MD Progress Note  07/03/2014 3:51 PM [name] Herring  MRN:  [id number] Subjective:  Patient states,'I continue to hear voices ,but they are not very loud". Objective: Patient seen and chart reviewed. Patient continues to be depressed with flat affect. Patient reports AH ,but is reduced. Patient reports his medications are helping. Patient denies any SI/HI/AH/VH. Patient is motivated to get help for his alcohol abuse and wants to go to a residential treatment program. Patient has multiple medical issues including HIV ,reports it [place] under control.  Diagnosis:   DSM5: Primary Psychiatric Diagnosis: Schizophrenia ,multiple episodes ,currently in acute episode   Secondary Psychiatric Diagnosis: Cocaine use disorder ,severe Alcohol use disorder,moderate   Non Psychiatric Diagnosis: HIV        Total Time spent with patient: 30 minutes   ADL's:  Impaired  Sleep: Fair  Appetite:  Fair  Psychiatric Specialty Exam: Physical Exam  ROS  Blood pressure 99/65, pulse 96, temperature 97.7 F (36.5 C), temperature source Oral, resp. rate 24, height 5\' 10"  (1.778 m), weight 63.504 kg (140 lb).Body mass index is 20.09 kg/(m^2).  General Appearance: Disheveled  Eye Sport and exercise psychologist::  Fair  Speech:  Normal Rate  Volume:  Normal  Mood:  Anxious  Affect:  Appropriate  Thought Process:  Irrelevant  Orientation:  Full (Time, Place, and Person)  Thought Content:  Hallucinations: Auditory  Suicidal Thoughts:  No  Homicidal Thoughts:  No  Memory:  Immediate;   Fair Recent;   Fair Remote;   Fair  Judgement:  Impaired  Insight:  Lacking  Psychomotor Activity:  Normal  Concentration:  Fair  Recall:  [employer] of Knowledge:Poor  Language: Good  Akathisia:  No    AIMS (if indicated):     Assets:  Communication Skills  Sleep:  Number of Hours: 6.5   Musculoskeletal: Strength & Muscle Tone: within normal limits Gait & Station: normal Patient leans: N/A  Current  Medications: Current Facility-Administered Medications  Medication Dose Route Frequency Provider Last Rate Last Dose  . acetaminophen (TYLENOL) tablet 650 mg  650 mg Oral Q6H PRN [name] Rankin, NP   650 mg at 07/03/14 0803  . alum & mag hydroxide-simeth (MAALOX/MYLANTA) 200-200-20 MG/5ML suspension 30 mL  30 mL Oral Q4H PRN [name] Rankin, NP      . benztropine (COGENTIN) tablet 0.5 mg  0.5 mg Oral QHS [name] Passey, MD      . gabapentin (NEURONTIN) capsule 600 mg  600 mg Oral QHS [name] Rankin, NP   600 mg at 07/02/14 2144  . hydrochlorothiazide (HYDRODIURIL) tablet 12.5 mg  12.5 mg Oral Daily [name] Rankin, NP   12.5 mg at 07/03/14 0800  . lamiVUDine-zidovudine (COMBIVIR) 150-300 MG per tablet 1 tablet  1 tablet Oral BID [name] Rankin, NP   1 tablet at 07/03/14 0801  . lopinavir-ritonavir (KALETRA) 200-50 MG per tablet 2 tablet  2 tablet Oral BID [name] Rankin, NP   2 tablet at 07/03/14 0801  . magnesium hydroxide (MILK OF MAGNESIA) suspension 30 mL  30 mL Oral Daily PRN [name] Rankin, NP      . mirtazapine (REMERON) tablet 15 mg  15 mg Oral QHS [name] Rankin, NP   15 mg at 07/02/14 2144  . multivitamin with minerals tablet 1 tablet  1 tablet Oral Daily [name] Rankin, NP   1 tablet at 07/03/14 0801  . ondansetron (ZOFRAN) tablet 4 mg  4 mg Oral Q8H PRN [name] Rankin, NP      . risperiDONE (RISPERDAL) tablet  3 mg  3 mg Oral QHS [name] Alert, MD        Lab Results: No results found for this or any previous visit (from the past 48 hour(s)).  Physical Findings: AIMS: Facial and Oral Movements Muscles of Facial Expression: None, normal Lips and Perioral Area: None, normal Jaw: None, normal Tongue: None, normal,Extremity Movements Upper (arms, wrists, hands, fingers): None, normal Lower (legs, knees, ankles, toes): None, normal, Trunk Movements Neck, shoulders, hips: None, normal, Overall Severity Severity of abnormal movements (highest score from questions above): None,  normal Incapacitation due to abnormal movements: None, normal Patient's awareness of abnormal movements (rate only patient's report): No Awareness, Dental Status Current problems with teeth and/or dentures?: No Does patient usually wear dentures?: No  CIWA:  CIWA-Ar Total: 4 COWS:  COWS Total Score: 1  Treatment Plan Summary: Daily contact with patient to assess and evaluate symptoms and progress in treatment Medication management  Plan: Patient will continue to  benefit from inpatient treatment and stabilization.   Will increase Risperdal to 3 mg po qhs for psychosis. Will continue Gbaapentin 600 mg po qhs . Will add Cogentin 0.5 mg po qhs.   Reviewed past medical records,treatment plan.  Will continue to monitor vitals ,medication compliance and treatment side effects while patient is here.  Will monitor for medical issues as well as call consult as needed.  Reviewed labs ,will order as needed.   CSW will start working on disposition. Patient will benefit from a substance abuse program. Patient to participate in therapeutic milieu .            Medical Decision Making Problem Points:  Established problem, stable/improving (1) Data Points:  Review and summation of old records (2) Review of medication regiment & side effects (2)  I certify that inpatient services furnished can reasonably be expected to improve the patient's condition.   [name] Selders  MD  07/03/2014, 3:51 PM

## 2014-07-04 DIAGNOSIS — F2081 Schizophreniform disorder: Secondary | ICD-10-CM

## 2014-07-04 MED ORDER — MIRTAZAPINE 30 MG PO TABS
30.0000 mg | ORAL_TABLET | Freq: Every day | ORAL | Status: DC
  Administered 2014-07-04: 30 mg via ORAL
  Filled 2014-07-04 (×3): qty 1

## 2014-07-04 NOTE — Progress Notes (Signed)
Patient ID: [name] Herring, male   DOB: 1961/01/27, 53 y.o.   MRN: [id number] Psychoeducational Group Note  Date:  07/04/2014 Time:1000am  Group Topic/Focus:  Identifying Needs:   The focus of this group is to help patients identify their personal needs that have been historically problematic and identify healthy behaviors to address their needs.  Participation Level:  Minimal  Participation Quality:  Inattentive  Affect:  Flat  Cognitive:  Disorganized  Insight:  None  Engagement in Group:  None  Additional Comments:  Healthy coping skills.   Pricilla Larsson 07/04/2014,10:34 AM

## 2014-07-04 NOTE — BHH Group Notes (Signed)
[facility] Group Notes:  (Clinical Social Work)  07/04/2014  11:15-12:00PM  Summary of Progress/Problems:   The main focus of today's process group was to discuss patients' feelings about hospitalization, the stigma attached to mental health, and sources of motivation to stay well.  We then worked to identify a specific plan to avoid future hospitalizations when discharged from the hospital for this admission.  The patient expressed that he is in the hospital to get help with his substance abuse and mental health.  He stated that he is frustrated with being in the hospital, and wonders "will they ever find a pill to cure my voices?"  He was able to acknowledge that he may have to find other coping mechanisms if his voices cannot be completely quelled.  He participated heavily and with insight the remainder of group.  He particularly liked the idea of speaking of one's illness with different terminology, instead of claiming it as a part of one's identity, said he would be adapting that into his life.  Type of Therapy:  Group Therapy - Process  Participation Level:  Active  Participation Quality:  Appropriate, Attentive, Sharing and Supportive  Affect:  Depressed and Flat  Cognitive:  Alert, Appropriate and Oriented  Insight:  Engaged  Engagement in Therapy:  Engaged  Modes of Intervention:  Exploration, Discussion  Selmer Dominion, LCSW 07/04/2014, 12:51 PM

## 2014-07-04 NOTE — Progress Notes (Addendum)
[facility] MD Progress Note  07/04/2014 1:17 PM [name] Herring  MRN:  [id number] Subjective:  Patient states,'voices are screaming to me ,but I feel the medications are working". Objective: Patient seen and chart reviewed. Patient appears to be pleasant ,smiling and feels like he is improving. Patient however reports screaming voices today,reports medications as working. Patient denies any SI/HI/AH/VH. Patient is motivated to get help for his alcohol abuse and wants to go to a residential treatment program. Patient has multiple medical issues including HIV ,reports it [place] under control.  Diagnosis:   DSM5: Primary Psychiatric Diagnosis: Schizophrenia ,multiple episodes ,currently in acute episode   Secondary Psychiatric Diagnosis: Cocaine use disorder ,severe Alcohol use disorder,moderate   Non Psychiatric Diagnosis: HIV        Total Time spent with patient: 30 minutes   ADL's:  Impaired  Sleep: Fair  Appetite:  Fair  Psychiatric Specialty Exam: Physical Exam  ROS  Blood pressure 131/95, pulse 70, temperature 97.6 F (36.4 C), temperature source Oral, resp. rate 18, height 5\' 10"  (1.778 m), weight 63.504 kg (140 lb).Body mass index is 20.09 kg/(m^2).  General Appearance: Disheveled  Eye Sport and exercise psychologist::  Fair  Speech:  Normal Rate  Volume:  Normal  Mood:  Anxious,depressed ,improving  Affect:  Appropriate  Thought Process:  Goal Directed  Orientation:  Full (Time, Place, and Person)  Thought Content:  Hallucinations: Auditory-screaming voices  Suicidal Thoughts:  No  Homicidal Thoughts:  No  Memory:  Immediate;   Fair Recent;   Fair Remote;   Fair  Judgement:  Impaired  Insight:  Lacking  Psychomotor Activity:  Normal  Concentration:  Fair  Recall:  [employer] of Knowledge:Poor  Language: Good  Akathisia:  No    AIMS (if indicated):     Assets:  Communication Skills  Sleep:  Number of Hours: 6.5   Musculoskeletal: Strength & Muscle Tone: within normal  limits Gait & Station: normal Patient leans: N/A  Current Medications: Current Facility-Administered Medications  Medication Dose Route Frequency Provider Last Rate Last Dose  . acetaminophen (TYLENOL) tablet 650 mg  650 mg Oral Q6H PRN Shuvon Rankin, NP   650 mg at 07/03/14 0803  . alum & mag hydroxide-simeth (MAALOX/MYLANTA) 200-200-20 MG/5ML suspension 30 mL  30 mL Oral Q4H PRN Shuvon Rankin, NP      . benztropine (COGENTIN) tablet 0.5 mg  0.5 mg Oral QHS Keelie Zemanek, MD   0.5 mg at 07/03/14 2117  . gabapentin (NEURONTIN) capsule 600 mg  600 mg Oral QHS Shuvon Rankin, NP   600 mg at 07/03/14 2117  . hydrochlorothiazide (HYDRODIURIL) tablet 12.5 mg  12.5 mg Oral Daily Shuvon Rankin, NP   12.5 mg at 07/04/14 0800  . lamiVUDine-zidovudine (COMBIVIR) 150-300 MG per tablet 1 tablet  1 tablet Oral BID Shuvon Rankin, NP   1 tablet at 07/04/14 0759  . lopinavir-ritonavir (KALETRA) 200-50 MG per tablet 2 tablet  2 tablet Oral BID Shuvon Rankin, NP   2 tablet at 07/04/14 0759  . magnesium hydroxide (MILK OF MAGNESIA) suspension 30 mL  30 mL Oral Daily PRN Shuvon Rankin, NP      . mirtazapine (REMERON) tablet 15 mg  15 mg Oral QHS Shuvon Rankin, NP   15 mg at 07/03/14 2120  . multivitamin with minerals tablet 1 tablet  1 tablet Oral Daily Shuvon Rankin, NP   1 tablet at 07/04/14 0759  . ondansetron (ZOFRAN) tablet 4 mg  4 mg Oral Q8H PRN Shuvon Rankin, NP      .  risperiDONE (RISPERDAL) tablet 3 mg  3 mg Oral QHS Ursula Alert, MD   3 mg at 07/03/14 2120    Lab Results: No results found for this or any previous visit (from the past 48 hour(s)).  Physical Findings: AIMS: Facial and Oral Movements Muscles of Facial Expression: None, normal Lips and Perioral Area: None, normal Jaw: None, normal Tongue: None, normal,Extremity Movements Upper (arms, wrists, hands, fingers): None, normal Lower (legs, knees, ankles, toes): None, normal, Trunk Movements Neck, shoulders, hips: None, normal, Overall  Severity Severity of abnormal movements (highest score from questions above): None, normal Incapacitation due to abnormal movements: None, normal Patient's awareness of abnormal movements (rate only patient's report): No Awareness, Dental Status Current problems with teeth and/or dentures?: No Does patient usually wear dentures?: No  CIWA:  CIWA-Ar Total: 2 COWS:  COWS Total Score: 1  Treatment Plan Summary: Daily contact with patient to assess and evaluate symptoms and progress in treatment Medication management  Plan: Patient will continue to  benefit from inpatient treatment and stabilization.   Will continue Risperdal 3 mg po qhs for psychosis. Will increase Remeron to 30 mg po qhs. Will continue Gabapentin 600 mg po qhs . Will add Cogentin 0.5 mg po qhs.   Reviewed past medical records,treatment plan.  Will continue to monitor vitals ,medication compliance and treatment side effects while patient is here.  Will monitor for medical issues as well as call consult as needed.  Reviewed labs ,will order as needed.   CSW will start working on disposition. Patient will benefit from a substance abuse program. Patient to participate in therapeutic milieu .            Medical Decision Making Problem Points:  Established problem, stable/improving (1) Data Points:  Review and summation of old records (2) Review of medication regiment & side effects (2) Review of new medications or change in dosage (2)  I certify that inpatient services furnished can reasonably be expected to improve the patient's condition.   Tanekia Ryans  MD  07/04/2014, 1:17 PM

## 2014-07-04 NOTE — Progress Notes (Signed)
Patient ID: [name] Herring, male   DOB: 10-31-1960, 53 y.o.   MRN: [id number] Psychoeducational Group Note  Date:  07/04/2014 Time:0930am  Group Topic/Focus:  Identifying Needs:   The focus of this group is to help patients identify their personal needs that have been historically problematic and identify healthy behaviors to address their needs.  Participation Level:  Minimal  Participation Quality:  Inattentive  Affect:  Flat  Cognitive:  Disorganized  Insight:  None  Engagement in Group:  None  Additional Comments:  Inventory group   Pricilla Larsson 07/04/2014,10:33 AM

## 2014-07-04 NOTE — Progress Notes (Signed)
Patient ID: [name] Herring, male   DOB: 03-06-1961, 53 y.o.   MRN: [id number] D. Patient presents with depressed mood, affect congruent. [name] continues to look disheveled, with poor hygiene. He continues to report feeling depressed, with persistent auditory hallucinations. '' I keep hearing the screaming, the voices aren't really any better, but I hope the medication will help.'' He denies any SI/HI , but continues to appear preoccupied. Pt has been isolative throughout shift thus far. A. Medications given as ordered. Support and encouragement provided. Discussed above information with Dr. Shea Evans.  R. Pt has been cooperative with staff, and he denies any acute concerns at this time. He is compliant with ward rules. Pt is safe, will continue to monitor q 15 minutes for safety.

## 2014-07-04 NOTE — Progress Notes (Signed)
The focus of this group is to help patients review their daily goal of treatment and discuss progress on daily workbooks. Pt attended the evening group session and responded to all discussion prompts from the [place]. Pt reported having had a good day on the unit, the highlight of which were the groups he attended. Pt told the group that his favorite coping skill at home was to practice yoga, which he has done for the past twenty-five years. 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 Pt's affect was appropriate and he volunteered several positive comments to his peers.

## 2014-07-05 MED ORDER — MIRTAZAPINE 45 MG PO TABS
45.0000 mg | ORAL_TABLET | Freq: Every day | ORAL | Status: DC
  Filled 2014-07-05: qty 1

## 2014-07-05 MED ORDER — MIRTAZAPINE 15 MG PO TABS
45.0000 mg | ORAL_TABLET | Freq: Every day | ORAL | Status: DC
  Administered 2014-07-05 – 2014-07-07 (×3): 45 mg via ORAL
  Filled 2014-07-05: qty 1
  Filled 2014-07-05: qty 9
  Filled 2014-07-05 (×3): qty 1
  Filled 2014-07-05: qty 3

## 2014-07-05 MED ORDER — HYDROCHLOROTHIAZIDE 12.5 MG PO CAPS
12.5000 mg | ORAL_CAPSULE | Freq: Every day | ORAL | Status: DC
  Administered 2014-07-06 – 2014-07-08 (×3): 12.5 mg via ORAL
  Filled 2014-07-05 (×5): qty 1

## 2014-07-05 MED ORDER — GABAPENTIN 300 MG PO CAPS
600.0000 mg | ORAL_CAPSULE | Freq: Every day | ORAL | Status: DC
  Administered 2014-07-05 – 2014-07-07 (×3): 600 mg via ORAL
  Filled 2014-07-05 (×4): qty 2
  Filled 2014-07-05: qty 6

## 2014-07-05 MED ORDER — RISPERIDONE 2 MG PO TABS
4.0000 mg | ORAL_TABLET | Freq: Every day | ORAL | Status: DC
  Administered 2014-07-05 – 2014-07-07 (×3): 4 mg via ORAL
  Filled 2014-07-05 (×4): qty 2
  Filled 2014-07-05: qty 6
  Filled 2014-07-05: qty 2

## 2014-07-05 MED ORDER — RISPERIDONE 2 MG PO TABS
4.0000 mg | ORAL_TABLET | Freq: Every day | ORAL | Status: DC
  Filled 2014-07-05 (×2): qty 2

## 2014-07-05 MED ORDER — BENZTROPINE MESYLATE 0.5 MG PO TABS
0.5000 mg | ORAL_TABLET | Freq: Every day | ORAL | Status: DC
  Administered 2014-07-05 – 2014-07-07 (×3): 0.5 mg via ORAL
  Filled 2014-07-05 (×4): qty 1
  Filled 2014-07-05: qty 3

## 2014-07-05 NOTE — Progress Notes (Signed)
[facility] MD Progress Note  07/05/2014 10:52 AM [name] Herring  MRN:  [id number] Subjective:  Patient states,'I continue to hear voices ,they never go away." Objective: Patient seen and chart reviewed. Patient appears to be pleasant , but continue to report voices. Patient reports that the voices never go down . Patient reports his mood as improved. Patient denies SI/HI/VH. Patient is motivated to get help for his alcohol abuse and wants to go to a residential treatment program. Patient has multiple medical issues including HIV ,reports it [place] under control.  Diagnosis:   DSM5: Primary Psychiatric Diagnosis: Schizophrenia ,multiple episodes ,currently in acute episode   Secondary Psychiatric Diagnosis: Cocaine use disorder ,severe Alcohol use disorder,moderate   Non Psychiatric Diagnosis: HIV        Total Time spent with patient: 30 minutes   ADL's:  Impaired  Sleep: Fair  Appetite:  Fair  Psychiatric Specialty Exam: Physical Exam  ROS  Blood pressure 118/91, pulse 71, temperature 97.8 F (36.6 C), temperature source Oral, resp. rate 18, height 5\' 10"  (1.778 m), weight 63.504 kg (140 lb).Body mass index is 20.09 kg/(m^2).  General Appearance: Disheveled  Eye Sport and exercise psychologist::  Fair  Speech:  Normal Rate  Volume:  Normal  Mood:  Anxious,depressed ,improving  Affect:  Appropriate  Thought Process:  Goal Directed  Orientation:  Full (Time, Place, and Person)  Thought Content:  Hallucinations: Auditory-screaming voices  Suicidal Thoughts:  No  Homicidal Thoughts:  No  Memory:  Immediate;   Fair Recent;   Fair Remote;   Fair  Judgement:  Impaired  Insight:  Lacking  Psychomotor Activity:  Normal  Concentration:  Fair  Recall:  [employer] of Knowledge:Poor  Language: Good  Akathisia:  No    AIMS (if indicated):     Assets:  Communication Skills  Sleep:  Number of Hours: 6.75   Musculoskeletal: Strength & Muscle Tone: within normal limits Gait & Station:  normal Patient leans: N/A  Current Medications: Current Facility-Administered Medications  Medication Dose Route Frequency Provider Last Rate Last Dose  . acetaminophen (TYLENOL) tablet 650 mg  650 mg Oral Q6H PRN Shuvon Rankin, NP   650 mg at 07/04/14 2112  . alum & mag hydroxide-simeth (MAALOX/MYLANTA) 200-200-20 MG/5ML suspension 30 mL  30 mL Oral Q4H PRN Shuvon Rankin, NP      . benztropine (COGENTIN) tablet 0.5 mg  0.5 mg Oral QHS Kaicee Scarpino, MD   0.5 mg at 07/04/14 2110  . gabapentin (NEURONTIN) capsule 600 mg  600 mg Oral QHS Shuvon Rankin, NP   600 mg at 07/04/14 2110  . hydrochlorothiazide (HYDRODIURIL) tablet 12.5 mg  12.5 mg Oral Daily Shuvon Rankin, NP   12.5 mg at 07/05/14 0742  . lamiVUDine-zidovudine (COMBIVIR) 150-300 MG per tablet 1 tablet  1 tablet Oral BID Shuvon Rankin, NP   1 tablet at 07/05/14 0742  . lopinavir-ritonavir (KALETRA) 200-50 MG per tablet 2 tablet  2 tablet Oral BID Shuvon Rankin, NP   2 tablet at 07/05/14 0742  . magnesium hydroxide (MILK OF MAGNESIA) suspension 30 mL  30 mL Oral Daily PRN Shuvon Rankin, NP      . mirtazapine (REMERON) tablet 30 mg  30 mg Oral QHS Ursula Alert, MD   30 mg at 07/04/14 2110  . multivitamin with minerals tablet 1 tablet  1 tablet Oral Daily Shuvon Rankin, NP   1 tablet at 07/05/14 0742  . ondansetron (ZOFRAN) tablet 4 mg  4 mg Oral Q8H PRN Shuvon Rankin, NP      .  risperiDONE (RISPERDAL) tablet 4 mg  4 mg Oral QHS Ursula Alert, MD        Lab Results: No results found for this or any previous visit (from the past 48 hour(s)).  Physical Findings: AIMS: Facial and Oral Movements Muscles of Facial Expression: None, normal Lips and Perioral Area: None, normal Jaw: None, normal Tongue: None, normal,Extremity Movements Upper (arms, wrists, hands, fingers): None, normal Lower (legs, knees, ankles, toes): None, normal, Trunk Movements Neck, shoulders, hips: None, normal, Overall Severity Severity of abnormal movements  (highest score from questions above): None, normal Incapacitation due to abnormal movements: None, normal Patient's awareness of abnormal movements (rate only patient's report): No Awareness, Dental Status Current problems with teeth and/or dentures?: No Does patient usually wear dentures?: No  CIWA:  CIWA-Ar Total: 2 COWS:  COWS Total Score: 1  Treatment Plan Summary: Daily contact with patient to assess and evaluate symptoms and progress in treatment Medication management  Plan: Patient will continue to  benefit from inpatient treatment and stabilization.   Will increase Risperdal to 4 mg po qhs for psychosis. Will increase Remeron to 45  mg po qhs. Will continue Gabapentin 600 mg po qhs . Will add Cogentin 0.5 mg po qhs.   Reviewed past medical records,treatment plan.  Will continue to monitor vitals ,medication compliance and treatment side effects while patient is here.  Will monitor for medical issues as well as call consult as needed.  Reviewed labs ,will order as needed.   CSW will start working on disposition. Patient will benefit from a substance abuse program. Patient to participate in therapeutic milieu .            Medical Decision Making Problem Points:  Established problem, stable/improving (1) Data Points:  Review and summation of old records (2) Review of medication regiment & side effects (2) Review of new medications or change in dosage (2)  I certify that inpatient services furnished can reasonably be expected to improve the patient's condition.   [name] Azizi  MD  07/05/2014, 10:52 AM

## 2014-07-05 NOTE — BHH Group Notes (Signed)
[facility] Group Notes:  (Clinical Social Work)  07/05/2014   11:15am-12:00pm  Summary of Progress/Problems:  The main focus of today's process group was to listen to a variety of genres of music and to identify that different types of music provoke different responses.  The patient then was able to identify personally what was soothing for them, as well as energizing.  Handouts were used to record feelings evoked, as well as how patient can personally use this knowledge in sleep habits, with depression, and with other symptoms.  The patient expressed understanding of concepts, as well as knowledge of how each type of music affected him/her and how this can be used at home as a wellness/recovery tool.  Type of Therapy:  Music Therapy   Participation Level:  Active  Participation Quality:  Attentive and Sharing  Affect:  Blunted  Cognitive:  Oriented  Insight:  Engaged  Engagement in Therapy:  Engaged  Modes of Intervention:   Activity, Exploration  Selmer Dominion, LCSW 07/05/2014, 12:30pm

## 2014-07-05 NOTE — Progress Notes (Signed)
Adult Psychoeducational Group Note  Date:  07/05/2014 Time:  9:19 PM  Group Topic/Focus:  Wrap-Up Group:   The focus of this group is to help patients review their daily goal of treatment and discuss progress on daily workbooks.  Participation Level:  Active  Participation Quality:  Appropriate  Affect:  Labile  Cognitive:  Oriented  Insight: Limited  Engagement in Group:  Limited  Modes of Intervention:  Support  Additional Comments:  Patient attended and participated in group tonight. He reports having a good day. He listened to music went for his groups and meals. He spoke with his X wife whom is also his support system.   Salley Scarlet [facility] 07/05/2014, 9:19 PM

## 2014-07-05 NOTE — BHH Group Notes (Signed)
[facility] Group Notes:  (Nursing/MHT/Case Management/Adjunct)  Date:  07/05/2014  Time:  0930  Type of Therapy:  Nurse Education  Participation Level:  Active  Participation Quality:  Appropriate  Affect:  Appropriate  Cognitive:  Appropriate  Insight:  Appropriate  Engagement in Group:  Engaged  Modes of Intervention:  Education  Summary of Progress/Problems:  Roshun Klingensmith L 07/05/2014, 1:50 PM

## 2014-07-05 NOTE — Progress Notes (Signed)
Patient ID: [name] Herring, male   DOB: 1961-04-13, 54 y.o.   MRN: [id number] D. Patient presents with depressed mood, affect congruent again today. [name] Herring continues to look disheveled, with unkempt hair. He continues to report feeling depressed, with persistent auditory hallucinations. He reports he is sleeping and eating well, but states the '' screaming voices are always there '' He denies any command hallucinations and he has been visible in the milieu interactive with peers and engaged in unit programming. He denies any SI/HI or pain. A. Medications given as ordered. Support and encouragement provided. Discussed above information with Dr. Shea Evans. R. Pt has been cooperative with staff, and he denies any acute concerns at this time. He is compliant with ward rules. Pt is safe, will continue to monitor q 15 minutes for safety.

## 2014-07-05 NOTE — Progress Notes (Signed)
D. Pt has been up and visible in milieu this evening, has attended and participated in evening wrap-up group. Pt still endorsing auditory hallucinations but feels the medications are beneficial to him. Pt denies SI and spoke about how he is wanting to quiet the voices. Pt did receive medications without incident. A. Support and encouragement provided. R. Safety maintained, will continue to monitor.

## 2014-07-06 DIAGNOSIS — F200 Paranoid schizophrenia: Secondary | ICD-10-CM

## 2014-07-06 NOTE — BHH Group Notes (Signed)
[facility] LCSW Aftercare Discharge Planning Group Note   07/06/2014 10:34 AM  Participation Quality:  Engaged  Mood/Affect:  Appropriate  Depression Rating:  5  Anxiety Rating:  5  Thoughts of Suicide:  No Will you contract for safety?   NA  Current AVH:  Yes  Plan for Discharge/Comments:  [name] Herring confirms that he is interested in rehab, and is willing to sign a release to explore the possibility.  Told that it is a long shot as he endorses on-going [facility], and that is reflected in the note as well.  He confirmed that he understands this.  Transportation Means: unk  Supports: [name]  Herring, [name] Herring

## 2014-07-06 NOTE — Progress Notes (Signed)
Patient ID: [name] Herring, male   DOB: November 17, 1960, 53 y.o.   MRN: [id number] [facility] MD Progress Note  07/06/2014 4:08 PM CASSIUS CULLINANE  MRN:  [id number] Subjective:   Patient states he is feeling better, but does continue to endorse hallucinations. Objective: Patient has a long history of auditory hallucinations, and describes ongoing /chronic/constant hallucinations, although he does acknowledge he has improved compared to admission. He does not appear internally preoccupied, and is not restless or distressed. Denies medication side effects. No disruptive behaviors on unit. As per Nursing Staff, he tends to isolate, remain in bed often, but he is going to groups. Patient wants to go to an residential rehab program upon discharge, such as [facility] or [facility].   Diagnosis:   DSM5: Primary Psychiatric Diagnosis: Schizophrenia ,multiple episodes ,currently in acute episode   Secondary Psychiatric Diagnosis: Cocaine use disorder ,severe Alcohol use disorder,moderate   Non Psychiatric Diagnosis: HIV   Total Time spent with patient: 20 minutes    ADL's: improved   Sleep: improved   Appetite:  Fair  Psychiatric Specialty Exam: Physical Exam  ROS  Blood pressure 122/85, pulse 78, temperature 97 F (36.1 C), temperature source Oral, resp. rate 20, height 5\' 10"  (1.778 m), weight 63.504 kg (140 lb).Body mass index is 20.09 kg/(m^2).  General Appearance: Fairly Groomed  Engineer, water::  Good  Speech:  Normal Rate  Volume:  Normal  Mood:  improved mood and range of affect ,  Affect:  Appropriate  Thought Process:  Goal Directed  Orientation:  Full (Time, Place, and Person)  Thought Content:  Hallucinations: Auditory-states he hears voices constantly, but does not appear internally preoccupied or distracted at this time.  Suicidal Thoughts:  No- at this time denies any plan or intention of hurting self and contracts for safety on unit   Homicidal Thoughts:  No  Memory:  Recent and  remote grossly intact   Judgement:  Fair  Insight:  Fair  Psychomotor Activity:  Normal  Concentration:  Fair  Recall:  [place] of Knowledge:Poor  Language: Good  Akathisia:  No    AIMS (if indicated):     Assets:  Communication Skills  Sleep:  Number of Hours: 6.75   Musculoskeletal: Strength & Muscle Tone: within normal limits Gait & Station: normal Patient leans: N/A  Current Medications: Current Facility-Administered Medications  Medication Dose Route Frequency Provider Last Rate Last Dose  . acetaminophen (TYLENOL) tablet 650 mg  650 mg Oral Q6H PRN Shuvon Rankin, NP   650 mg at 07/06/14 0806  . alum & mag hydroxide-simeth (MAALOX/MYLANTA) 200-200-20 MG/5ML suspension 30 mL  30 mL Oral Q4H PRN Shuvon Rankin, NP      . benztropine (COGENTIN) tablet 0.5 mg  0.5 mg Oral Q2000 Saramma Eappen, MD   0.5 mg at 07/05/14 2059  . gabapentin (NEURONTIN) capsule 600 mg  600 mg Oral Q2000 Ursula Alert, MD   600 mg at 07/05/14 2101  . hydrochlorothiazide (MICROZIDE) capsule 12.5 mg  12.5 mg Oral Q breakfast Ursula Alert, MD   12.5 mg at 07/06/14 0802  . lamiVUDine-zidovudine (COMBIVIR) 150-300 MG per tablet 1 tablet  1 tablet Oral BID Shuvon Rankin, NP   1 tablet at 07/06/14 0803  . lopinavir-ritonavir (KALETRA) 200-50 MG per tablet 2 tablet  2 tablet Oral BID Shuvon Rankin, NP   2 tablet at 07/06/14 0803  . magnesium hydroxide (MILK OF MAGNESIA) suspension 30 mL  30 mL Oral Daily PRN Shuvon Rankin, NP      .  mirtazapine (REMERON) tablet 45 mg  45 mg Oral Q2000 Ursula Alert, MD   45 mg at 07/05/14 2059  . multivitamin with minerals tablet 1 tablet  1 tablet Oral Daily Shuvon Rankin, NP   1 tablet at 07/06/14 0804  . ondansetron (ZOFRAN) tablet 4 mg  4 mg Oral Q8H PRN Shuvon Rankin, NP      . risperiDONE (RISPERDAL) tablet 4 mg  4 mg Oral Q2000 Saramma Eappen, MD   4 mg at 07/05/14 2059    Lab Results: No results found for this or any previous visit (from the past 48  hour(s)).  Physical Findings: AIMS: Facial and Oral Movements Muscles of Facial Expression: None, normal Lips and Perioral Area: None, normal Jaw: None, normal Tongue: None, normal,Extremity Movements Upper (arms, wrists, hands, fingers): None, normal Lower (legs, knees, ankles, toes): None, normal, Trunk Movements Neck, shoulders, hips: None, normal, Overall Severity Severity of abnormal movements (highest score from questions above): None, normal Incapacitation due to abnormal movements: None, normal Patient's awareness of abnormal movements (rate only patient's report): No Awareness, Dental Status Current problems with teeth and/or dentures?: No Does patient usually wear dentures?: No  CIWA:  CIWA-Ar Total: 1 COWS:  COWS Total Score: 1  Assessment : At this time patient is improved compared to admission.  He reports chronic hallucinations, but does not seem distressed or preoccupied by these at this time and has had no agitated or self injurious behaviors. Tolerating medications well.   Treatment Plan Summary: Daily contact with patient to assess and evaluate symptoms and progress in treatment Medication management  Plan: Patient will continue to  benefit from inpatient treatment and stabilization.  Continue to encourage milieu participation. Risperdal  4 mg po qhs. Remeron  45  mg po qhs. Gabapentin 600 mg po qhs . Cogentin 0.5 mg po qhs. Patient is interested in going to inpatient rehab program after discharge.   Medical Decision Making Problem Points:  Established problem, stable/improving (1) Data Points:  Review of medication regiment & side effects (2)  I certify that inpatient services furnished can reasonably be expected to improve the patient's condition.   Neita Garnet  MD  07/06/2014, 4:08 PM

## 2014-07-06 NOTE — Progress Notes (Addendum)
D:  Patient denied HI.  Denied visual hallucinations.  Patient stated he does hear voices to hurt self but patient stated he would not hurt himself while at patient at [facility].   Patient contracts for safety.  Patient stated he wants to go to rehab after discharge from [facility], possibly [facility] or [place]. A.  Medications administered per MD orders.  Emotional support and encouragement given patient. R:  Safety maintained with 15 minute checks.  Patient's self inventory sheet, patient has fair sleep, no sleep medication required.  Fair appetite, normal energy level, good concentration.  Rated depression 6, hopeless 6, anxiety 8.  Denied withdrawals.  SI, contracts for safety.  Has experienced pain in past 24 hours.  Worst pain #9.  Pain medication is helpful.  Goal is to feel better.  No discharge plans.  No problems taking medications after discharge.

## 2014-07-06 NOTE — Progress Notes (Signed)
D. Pt has been up and has been visible in milieu this evening. Pt spoke about how his day has been ok and still endorsing auditory hallucinations, however pt does not appear to be in any acute distress of this. Pt spoke about how the voices are always there and is used to it but also that sometimes they get louder than other times. Pt did receive medications without incident this evening. A. Support and encouragement provided. R. Safety maintained, will continue to monitor.

## 2014-07-06 NOTE — Progress Notes (Signed)
Patient ID: [name] Herring, male   DOB: Jul 31, 1961, 53 y.o.   MRN: [id number] PER STATE REGULATIONS 482.30  THIS CHART WAS REVIEWED FOR MEDICAL NECESSITY WITH RESPECT TO THE PATIENT'S ADMISSION/ DURATION OF STAY.  NEXT REVIEW DATE: 07/09/2014  Chauncy Lean, RN, BSN CASE MANAGER

## 2014-07-06 NOTE — BHH Group Notes (Signed)
[place] LCSW Group Therapy  07/06/2014 1:15 pm  Type of Therapy: Process Group Therapy  Participation Level:  Active  Participation Quality:  Appropriate  Affect:  Flat  Cognitive:  Oriented  Insight:  Improving  Engagement in Group:  Limited  Engagement in Therapy:  Limited  Modes of Intervention:  Activity, Clarification, Education, Problem-solving and Support  Summary of Progress/Problems: Today's group addressed the issue of overcoming obstacles.  Patients were asked to identify their biggest obstacle post d/c that stands in the way of their on-going success, and then problem solve as to how to manage this.  Varnell talked about his family's high expectations for him and how he needs them to cut his some slack so he doesn't feel like he has to be perfect.  He was encouraged by other group members to talk to them directly about this.  He also talked about wondering where he would land at discharge.  "They would not accept me at [facility], and so now I am homeless.  The worst decision I ever made in the past was to go to an ALF.  I learned from that experience to not make impulsive decisions."  States he will probably go the homeless shelter until he gets his check next month.  [name] 07/06/2014   4:00 PM

## 2014-07-07 NOTE — Progress Notes (Signed)
Adult Psychoeducational Group Note  Date:  07/07/2014 Time:  3:09 AM  Group Topic/Focus:  Wrap-Up Group:   The focus of this group is to help patients review their daily goal of treatment and discuss progress on daily workbooks.  Participation Level:  Minimal  Participation Quality:  Appropriate  Affect:  Excited  Cognitive:  Lacking  Insight: Limited  Engagement in Group:  Limited  Modes of Intervention:  Socialization and Support  Additional Comments:  Patient attended and participated in group tonight. He reports having a good day today. He received lots of insights from his groups. He attended meals and spoke with his doctor.   Salley Scarlet [facility] 07/07/2014, 3:09 AM

## 2014-07-07 NOTE — Progress Notes (Signed)
D: Pt informed the writer he may be discharged to [facility] or a shelter. Stated "they tried to get me into other places, but they wouldn't accept me because I hear voices".  Pt spoke to Probation officer about wanting to turn a new leaf. Stated he was put on a pedestal as a young man, but realizes he's not perfect. "If I could do it all over again I would do it differently.  A:  Support and encouragement was offered. 15 min checks continued for safety.  R: Pt remains safe.

## 2014-07-07 NOTE — Progress Notes (Signed)
Patient ID: [name] Herring, male   DOB: Dec 04, 1960, 53 y.o.   MRN: [id number] [facility] MD Progress Note  07/07/2014 11:07 AM TALLON GERTZ  MRN:  [id number] Subjective:   Patient states " I am OK,but I still have the voices ,they never go away:Marland Kitchen Objective: Patient has a long history of auditory hallucinations, and describes ongoing /chronic/constant hallucinations, although he does acknowledge he has improved compared to admission. Patient reports his AH never goes away and that he has been trying to cope with it. Denies medication side effects. No disruptive behaviors on unit. As per Nursing Staff, patient is compliant on medications, denies side effects.    Diagnosis:   DSM5: Primary Psychiatric Diagnosis: Schizophrenia ,multiple episodes ,currently in acute episode   Secondary Psychiatric Diagnosis: Cocaine use disorder ,severe Alcohol use disorder,moderate   Non Psychiatric Diagnosis: HIV   Total Time spent with patient: 20 minutes    ADL's: improved   Sleep: improved   Appetite:  Fair  Psychiatric Specialty Exam: Physical Exam  ROS  Blood pressure 117/87, pulse 84, temperature 97.6 F (36.4 C), temperature source Oral, resp. rate 16, height 5\' 10"  (1.778 m), weight 63.504 kg (140 lb).Body mass index is 20.09 kg/(m^2).  General Appearance: Fairly Groomed  Engineer, water::  Good  Speech:  Normal Rate  Volume:  Normal  Mood:  improved mood and range of affect ,  Affect:  Appropriate  Thought Process:  Goal Directed  Orientation:  Full (Time, Place, and Person)  Thought Content:  Hallucinations: Auditory-states he hears voices constantly, but does not appear internally preoccupied or distracted at this time.  Suicidal Thoughts:  No- at this time denies any plan or intention of hurting self and contracts for safety on unit   Homicidal Thoughts:  No  Memory:  Recent and remote grossly intact   Judgement:  Fair  Insight:  Fair  Psychomotor Activity:  Normal   Concentration:  Fair  Recall:  [place] of Knowledge:Poor  Language: Good  Akathisia:  No    AIMS (if indicated):     Assets:  Communication Skills  Sleep:  Number of Hours: 6.75   Musculoskeletal: Strength & Muscle Tone: within normal limits Gait & Station: normal Patient leans: N/A  Current Medications: Current Facility-Administered Medications  Medication Dose Route Frequency Provider Last Rate Last Dose  . acetaminophen (TYLENOL) tablet 650 mg  650 mg Oral Q6H PRN Shuvon Rankin, NP   650 mg at 07/06/14 0806  . alum & mag hydroxide-simeth (MAALOX/MYLANTA) 200-200-20 MG/5ML suspension 30 mL  30 mL Oral Q4H PRN Shuvon Rankin, NP      . benztropine (COGENTIN) tablet 0.5 mg  0.5 mg Oral Q2000 Garion Wempe, MD   0.5 mg at 07/06/14 2113  . gabapentin (NEURONTIN) capsule 600 mg  600 mg Oral Q2000 Cassidie Veiga, MD   600 mg at 07/06/14 2113  . hydrochlorothiazide (MICROZIDE) capsule 12.5 mg  12.5 mg Oral Q breakfast Ursula Alert, MD   12.5 mg at 07/07/14 0807  . lamiVUDine-zidovudine (COMBIVIR) 150-300 MG per tablet 1 tablet  1 tablet Oral BID Shuvon Rankin, NP   1 tablet at 07/07/14 0926  . lopinavir-ritonavir (KALETRA) 200-50 MG per tablet 2 tablet  2 tablet Oral BID Shuvon Rankin, NP   2 tablet at 07/07/14 0808  . magnesium hydroxide (MILK OF MAGNESIA) suspension 30 mL  30 mL Oral Daily PRN Shuvon Rankin, NP      . mirtazapine (REMERON) tablet 45 mg  45 mg Oral  [place], MD   45 mg at 07/06/14 2113  . multivitamin with minerals tablet 1 tablet  1 tablet Oral Daily Shuvon Rankin, NP   1 tablet at 07/07/14 0809  . ondansetron (ZOFRAN) tablet 4 mg  4 mg Oral Q8H PRN Shuvon Rankin, NP      . risperiDONE (RISPERDAL) tablet 4 mg  4 mg Oral Q2000 Robecca Fulgham, MD   4 mg at 07/06/14 2113    Lab Results: No results found for this or any previous visit (from the past 48 hour(s)).  Physical Findings: AIMS: Facial and Oral Movements Muscles of Facial Expression: None,  normal Lips and Perioral Area: None, normal Jaw: None, normal Tongue: None, normal,Extremity Movements Upper (arms, wrists, hands, fingers): None, normal Lower (legs, knees, ankles, toes): None, normal, Trunk Movements Neck, shoulders, hips: None, normal, Overall Severity Severity of abnormal movements (highest score from questions above): None, normal Incapacitation due to abnormal movements: None, normal Patient's awareness of abnormal movements (rate only patient's report): No Awareness, Dental Status Current problems with teeth and/or dentures?: No Does patient usually wear dentures?: No  CIWA:  CIWA-Ar Total: 1 COWS:  COWS Total Score: 1  Assessment : At this time patient is improved compared to admission.  He reports chronic hallucinations, but does not seem distressed or preoccupied by these at this time and has had no agitated or self injurious behaviors. Tolerating medications well.   Treatment Plan Summary: Daily contact with patient to assess and evaluate symptoms and progress in treatment Medication management  Plan: Patient will continue to  benefit from inpatient treatment and stabilization.  Continue to encourage milieu participation. Risperdal  4 mg po qhs. Remeron  45  mg po qhs. Gabapentin 600 mg po qhs . Cogentin 0.5 mg po qhs. CSW will work on disposition. Patient to be discharged tomorrow ,if he continues to be stable.   Medical Decision Making Problem Points:  Established problem, stable/improving (1) Data Points:  Review of medication regiment & side effects (2)  I certify that inpatient services furnished can reasonably be expected to improve the patient's condition.   Nelva Hauk  MD  07/07/2014, 11:07 AM

## 2014-07-07 NOTE — BHH Group Notes (Signed)
The focus of this group is to educate the patient on the purpose and policies of crisis stabilization and provide a format to answer questions about their admission.  The group details unit policies and expectations of patients while admitted.  Patient attended nurse education orientation group this morning.  Patient actively participated, appropriate affect, alert, appropriate insight and engagement.  Today patient will work on 3 goals for discharge.

## 2014-07-07 NOTE — Progress Notes (Signed)
Adult Psychoeducational Group Note  Date:  07/07/2014 Time:  10:06 PM  Group Topic/Focus:  Wrap-Up Group:   The focus of this group is to help patients review their daily goal of treatment and discuss progress on daily workbooks.  Participation Level:  Active  Participation Quality:  Appropriate  Affect:  Appropriate  Cognitive:  Appropriate  Insight: Appropriate  Engagement in Group:  Engaged  Modes of Intervention:  Socialization and Support  Additional Comments:  Patient attended and participated in group tonight. He reports having a good day. He is schedule to leave tomorrow. Today her attended his groups and went for meals. He advised that today he was not himself. He has been very "jolly".  To him community means sticking together.   Salley Scarlet [facility] 07/07/2014, 10:06 PM

## 2014-07-07 NOTE — Tx Team (Signed)
  Interdisciplinary Treatment Plan Update   Date Reviewed:  07/07/2014  Time Reviewed:  8:17 AM  Progress in Treatment:   Attending groups: Yes Participating in groups: Yes Taking medication as prescribed: Yes  Tolerating medication: Yes Family/Significant other contact made: Yes  Patient understands diagnosis: Yes  Discussing patient identified problems/goals with staff: Yes  See initial care plan Medical problems stabilized or resolved: Yes Denies suicidal/homicidal ideation: Yes  In tx team Patient has not harmed self or others: Yes  For review of initial/current patient goals, please see plan of care.  Estimated Length of Stay:  Likely d/c tomorrow  Reason for Continuation of Hospitalization:   New Problems/Goals identified:  N/A  Discharge Plan or Barriers:   [facility] said no.  Plans to go to homeless shelter until he receives his check in Nov. Follow up [facility] Sessions Additional Comments:    Attendees:  Signature: Steva Colder, MD 07/07/2014 8:17 AM   Signature: Ripley Fraise, LCSW 07/07/2014 8:17 AM  Signature: Elmarie Shiley, NP 07/07/2014 8:17 AM  Signature: Grayland Ormond, RN 07/07/2014 8:17 AM  Signature:  07/07/2014 8:17 AM  Signature:  07/07/2014 8:17 AM  Signature:   07/07/2014 8:17 AM  Signature:    Signature:    Signature:    Signature:    Signature:    Signature:      Scribe for Treatment Team:   Ripley Fraise, LCSW  07/07/2014 8:17 AM

## 2014-07-07 NOTE — Progress Notes (Signed)
Patient ID: [name] Herring, male   DOB: 1961/07/29, 53 y.o.   MRN: [id number]   D: When asked about his day pt stated, "I'm getting outta here tomorrow". Pt stated he being discharged and plans to stay at the [facility] or a shelter. Stated that his ultimate plan is to move to [facility] to be with his daughter. When asked if he had contacted his daughter pt stated, "no it's going to be a surprise". Writer encouraged pt to notify daughter of his plans so that she can prepare. Pt states he's anxious for discharge because he needs a cigarette.   A:  Support and encouragement was offered. 15 min checks continued for safety.  R: Pt remains safe.

## 2014-07-07 NOTE — BHH Group Notes (Signed)
[place] LCSW Group Therapy  07/07/2014 , 1:15 PM   Type of Therapy:  Group Therapy  Participation Level:  Active  Participation Quality:  Attentive  Affect:  Appropriate  Cognitive:  Alert  Insight:  Improving  Engagement in Therapy:  Engaged  Modes of Intervention:  Discussion, Exploration and Socialization  Summary of Progress/Problems: Today's group focused on the term Diagnosis.  Participants were asked to define the term, and then pronounce whether it is a negative, positive or neutral term.  [name] Herring fully embraces his "mental illness" and his journey that includes making poor decisions and time in the hospital to "get well again."  He experiences his diagnosis as positive as "it has helped me understand what is wrong, and why I do some of the things I do," and, unlike some of his peers, did not identify any stigma attached to it.  He enjoys giving positive feed back to others.  [name] 07/07/2014 , 1:15 PM

## 2014-07-07 NOTE — Progress Notes (Addendum)
D:  Patient denied SI and HI, contracts for safety.  Denied visual hallucinations.  Stated he does hear voices to hurt himself.  Patient's self inventory sheet, patient has fair sleep, no sleep medication given.  Fair appetite, normal energy level, good concentration.  Rated depression 8, hopeless 9, anxiety 8.  Denied withdrawals.  Denied IS.  Denied physical problems.  Denied physical pain.  Refused pain medications.  Goal to find a place to live.  Does have discharge plans.  No problems following discharge plan.  A:  Medications administered per MD orders.  Emotional support and encouragement given patient. R:  Safety maintained with 15 minute checks. Patient may be discharged on Wednesday to a shelter.

## 2014-07-08 MED ORDER — LAMIVUDINE-ZIDOVUDINE 150-300 MG PO TABS: 1.0000 | ORAL_TABLET | Freq: Two times a day (BID) | ORAL | Status: DC

## 2014-07-08 MED ORDER — HYDROCHLOROTHIAZIDE 25 MG PO TABS: 12.5000 mg | ORAL_TABLET | Freq: Every day | ORAL | Status: DC

## 2014-07-08 MED ORDER — LOPINAVIR-RITONAVIR 200-50 MG PO TABS: 2.0000 | ORAL_TABLET | Freq: Two times a day (BID) | ORAL | Status: DC

## 2014-07-08 MED ORDER — BENZTROPINE MESYLATE 0.5 MG PO TABS: 0.5000 mg | ORAL_TABLET | Freq: Every day | ORAL | Status: DC

## 2014-07-08 MED ORDER — GABAPENTIN 300 MG PO CAPS: 600.0000 mg | ORAL_CAPSULE | Freq: Every day | ORAL | Status: DC

## 2014-07-08 MED ORDER — ADULT MULTIVITAMIN W/MINERALS CH: 1.0000 | ORAL_TABLET | Freq: Every day | ORAL | Status: DC

## 2014-07-08 MED ORDER — RISPERIDONE 4 MG PO TABS: 4.0000 mg | ORAL_TABLET | Freq: Every day | ORAL | Status: DC

## 2014-07-08 MED ORDER — MIRTAZAPINE 45 MG PO TABS: 45.0000 mg | ORAL_TABLET | Freq: Every day | ORAL | Status: DC

## 2014-07-08 NOTE — BHH Suicide Risk Assessment (Signed)
Demographic Factors:  Male and Low socioeconomic status  Total Time spent with patient: 45 minutes  Psychiatric Specialty Exam: Physical Exam  Constitutional: He is oriented to person, place, and time. He appears well-developed and well-nourished.  HENT:  Head: Normocephalic and atraumatic.  Neck: Normal range of motion.  Respiratory: Effort normal.  GI: Soft.  Musculoskeletal: Normal range of motion.  Neurological: He is alert and oriented to person, place, and time.  Skin: Skin is warm.  Psychiatric: His speech is normal and behavior is normal. Judgment normal. His mood appears not anxious. Thought content is not paranoid. Cognition and memory are normal. He does not exhibit a depressed mood. He expresses no homicidal and no suicidal ideation.    Review of Systems  Constitutional: Negative.   HENT: Negative.   Eyes: Negative.   Cardiovascular: Negative.   Gastrointestinal: Negative.   Genitourinary: Negative.   Musculoskeletal: Negative.   Skin: Negative.   Neurological: Negative.   Psychiatric/Behavioral: Positive for hallucinations (chronic AH for 20 years ,reports they are vague noises which never go away). Negative for suicidal ideas and substance abuse.    Blood pressure 124/86, pulse 79, temperature 98.2 F (36.8 C), temperature source Oral, resp. rate 20, height 5\' 10"  (1.778 m), weight 63.504 kg (140 lb).Body mass index is 20.09 kg/(m^2).  General Appearance: Casual  Eye Contact::  Fair  Speech:  Normal Rate  Volume:  Normal  Mood:  Euthymic  Affect:  Appropriate  Thought Process:  Coherent  Orientation:  Full (Time, Place, and Person)  Thought Content:  Hallucinations: Auditory-chronic AH which never goes away -not bothered by it ,has it at baseline  Suicidal Thoughts:  No  Homicidal Thoughts:  No  Memory:  Immediate;   Fair Recent;   Fair Remote;   Fair  Judgement:  Fair  Insight:  Fair  Psychomotor Activity:  Normal  Concentration:  Fair  Recall:   [employer] of Knowledge:Fair  Language: Good  Akathisia:  No    AIMS (if indicated):     Assets:  Communication Skills Desire for Improvement  Sleep:  Number of Hours: 6.75    Musculoskeletal: Strength & Muscle Tone: within normal limits Gait & Station: normal Patient leans: N/A   Mental Status Per Nursing Assessment::   On Admission:  Suicidal ideation indicated by patient;Self-harm thoughts  Current Mental Status by Physician: denies SI/HI/VH -has AH that is chronic and is vague and has been present for >20 years ,he is not bothered by it and able to cope with it.  Loss Factors: Decline in physical health and Financial problems/change in socioeconomic status  Historical Factors: Impulsivity  Risk Reduction Factors:   Positive therapeutic relationship and Positive coping skills or problem solving skills  Continued Clinical Symptoms:  Previous Psychiatric Diagnoses and Treatments Medical Diagnoses and Treatments/Surgeries  Cognitive Features That Contribute To Risk:  Polarized thinking    Suicide Risk:  Minimal: No identifiable suicidal ideation.  Patients presenting with no risk factors but with morbid ruminations; may be classified as minimal risk based on the severity of the depressive symptoms  Discharge Diagnoses:DSM 5   Primary Psychiatric Diagnosis:  Schizophrenia ,multiple episodes ,currently in (acute episode -RESOLVED)  Secondary Psychiatric Diagnosis:  Cocaine use disorder ,severe  Alcohol use disorder,moderate   Non Psychiatric Diagnosis:  HIV   Past Medical History  Diagnosis Date  . Hypertension   . HIV (human immunodeficiency virus infection)   . Schizophrenia   . Depression     Plan  Of Care/Follow-up recommendations:  Activity:  No restrictions  Is patient on multiple antipsychotic therapies at discharge:  No   Has Patient had three or more failed trials of antipsychotic monotherapy by history:  No  Recommended Plan for Multiple  Antipsychotic Therapies: NA    Jaymar Loeber MD 07/08/2014, 11:23 AM

## 2014-07-08 NOTE — Discharge Summary (Signed)
Patient was seen face to face for psychiatric evaluation, suicide risk assessment and case discussed with treatment team and NP and made appropriate disposition plans. Reviewed the information documented and agree with the treatment plan.   Nesanel Aguila ,MD Attending Psychiatrist  [facility]    

## 2014-07-08 NOTE — Progress Notes (Signed)
Pt was discharged home today. He denied any S/I H/I or A/V hallucinations.  He was given f/u appointment, rx, sample medications, hotline info booklet, and a bus pass.  He voiced understanding to all instructions provided.  He declined the need for smoking cessation materials.

## 2014-07-08 NOTE — Progress Notes (Signed)
[facility] Adult Case Management Discharge Plan :  Will you be returning to the same living situation after discharge: Yes,  shelter until disability check comes at end of month. At discharge, do you have transportation home?:Yes,  bus pass Do you have the ability to pay for your medications:Yes,  Medicare  Release of information consent forms completed and submitted to Medical Records by CSW. Patient to Follow up at: Follow-up Information   Follow up with [facility]. (For your first visit you will need to go to the walk-in clinic M-F between 8 and 10 AM fro your hospital follow up appointment.  After that you will get an appointment.)    Contact information:   [place] [phone]      Patient denies SI/HI:   Yes,  during group/self report.    Safety Planning and Suicide Prevention discussed:  Yes,  Pt refused to consent to family contact. SPI pamphlet provided to pt and SPE completed with him. He was encouraged to share information with support network, ask questions, and talk about any concerns relating to SPE.  Smart, Edis Huish LCSWA  07/08/2014, 10:43 AM

## 2014-07-08 NOTE — BHH Group Notes (Signed)
[facility] LCSW Aftercare Discharge Planning Group Note   07/08/2014 10:15 AM  Participation Quality:  Active  Mood/Affect:  Appropriate  Depression Rating:  5  Anxiety Rating:  0  Thoughts of Suicide:  No Will you contract for safety?   NA  Current AVH: Yes   Plan for Discharge/Comments:   [name] Herring states his anxiety and depression are normal. He reports he is hearing voices and noises, but he cannot not understand what they are saying and that this is normal for him.  [name] Herring will be discharged today. He is going to a shelter and receiving outpatient services at [facility].   Transportation Means: Bus   Supports: None.   Hyatt,Candace

## 2014-07-08 NOTE — Discharge Summary (Signed)
Physician Discharge Summary Note  Patient:  [name] Herring is an 53 y.o., male MRN:  [id number] DOB:  05-04-1961 Patient phone:  [phone] (home)  Patient address:   [place] [address],  Total Time spent with patient: 20 minutes  Date of Admission:  07/01/2014 Date of Discharge: 07/08/14  Reason for Admission:  Psychosis  Discharge Diagnoses: Active Problems:   Psychotic disorder  See Physician SRA Psychiatric Specialty Exam: Physical Exam  Psychiatric: He has a normal mood and affect. His speech is normal and behavior is normal. Judgment and thought content normal. Cognition and memory are normal.    Review of Systems  Constitutional: Negative.   HENT: Negative.   Eyes: Negative.   Respiratory: Negative.   Cardiovascular: Negative.   Gastrointestinal: Negative.   Genitourinary: Negative.   Musculoskeletal: Negative.   Skin: Negative.   Neurological: Negative.   Endo/Heme/Allergies: Negative.   Psychiatric/Behavioral: Positive for hallucinations (Stable with treatment).    Blood pressure 124/86, pulse 79, temperature 98.2 F (36.8 C), temperature source Oral, resp. rate 20, height 5\' 10"  (1.778 m), weight 63.504 kg (140 lb).Body mass index is 20.09 kg/(m^2).   Past Psychiatric History: See H&P Diagnosis:  Hospitalizations:  Outpatient Care:  Substance Abuse Care:  Self-Mutilation:  Suicidal Attempts:  Violent Behaviors:   Musculoskeletal: Strength & Muscle Tone: within normal limits Gait & Station: normal Patient leans: N/A  DSM5:  Primary Psychiatric Diagnosis:  Schizophrenia ,multiple episodes ,currently in (acute episode -RESOLVED)  Secondary Psychiatric Diagnosis:  Cocaine use disorder ,severe  Alcohol use disorder,moderate  Non Psychiatric Diagnosis:  HIV  Level of Care:  OP  Hospital Course: [name] Herring is a 53 year old man who has a history of chronic mental illness and states he has been diagnosed with schizophrenia in  the past. Reports chronic auditory hallucinations, which although chronic, exacerbate at times. States recently they have become more disturbing and louder, due to which he decided to come to ED. States voices tell him to kill people and to kill himself, but denies any actual plan or intention to do so. Patient recently relapsed on alcohol and cocaine, x 1 day only ( about one week ago or so) . States that before this was sober for years. States that hallucinations predated his relapse and that actually he used the cocaine in an attempt to address hallucinations. Although at this time he does not endorse this , chart notes indicate that he was having suicidal ideations of overdosing and homicidal ideations of poisoning people with arsenic, although did not identify any specific person. As noted, at this time does not mention this, and contracts for safety on the unit, stating he does not want to die or kill anyone. Patient states he has been feeling depressed which he attributes at least partly to hallucinations Patient reports being on Remeron and Neurontin, but has not recently been taking an antipsychotic. States that he had been on Risperidone in the past, which helped, but had apparently stopped it due to dry mouth.         [name] Herring was admitted to the adult 400 unit where he was evaluated and his symptoms were identified. Medication management was discussed and implemented. His Remeron dosage was increased to 45 mg daily to treat symptoms of depression. Patient was started on Risperdal 4 mg daily to target his psychotic symptoms. He was encouraged to participate in unit programming. Medical problems were identified and treated appropriately. Home medication was restarted as needed.  His medications to treat HIV infection were continued during his hospital admission. He was evaluated each day by a clinical provider to ascertain the patient's response to treatment.  Improvement was noted by the patient's  report of decreasing symptoms, improved sleep and appetite, affect, medication tolerance, behavior, and participation in unit programming.  The patient was asked each day to complete a self inventory noting mood, mental status, pain, new symptoms, anxiety and concerns.         He responded well to medication and being in a therapeutic and supportive environment. Positive and appropriate behavior was noted and the patient was motivated for recovery. The patient reported that although the voices had decreased that they never fully go away. Patient has a long history of chronic auditory hallucinations.  He worked closely with the treatment team and case manager to develop a discharge plan with appropriate goals. Coping skills, problem solving as well as relaxation therapies were also part of the unit programming.         By the day of discharge he was in much improved condition than upon admission.  Symptoms were reported as significantly decreased or resolved completely.  The patient denied SI/HI and voiced no AVH. He was motivated to continue taking medication with a goal of continued improvement in mental health. [name] Herring was discharged home with a plan to follow up as noted below. Patient left [facility] in stable condition with all belongings returned to him.   Consults:  psychiatry  Significant Diagnostic Studies:  UDS positive for cocaine, Chemistry panel  Discharge Vitals:   Blood pressure 124/86, pulse 79, temperature 98.2 F (36.8 C), temperature source Oral, resp. rate 20, height 5\' 10"  (1.778 m), weight 63.504 kg (140 lb). Body mass index is 20.09 kg/(m^2). Lab Results:   No results found for this or any previous visit (from the past 72 hour(s)).  Physical Findings: AIMS: Facial and Oral Movements Muscles of Facial Expression: None, normal Lips and Perioral Area: None, normal Jaw: None, normal Tongue: None, normal,Extremity Movements Upper (arms, wrists, hands, fingers): None,  normal Lower (legs, knees, ankles, toes): None, normal, Trunk Movements Neck, shoulders, hips: None, normal, Overall Severity Severity of abnormal movements (highest score from questions above): None, normal Incapacitation due to abnormal movements: None, normal Patient's awareness of abnormal movements (rate only patient's report): No Awareness, Dental Status Current problems with teeth and/or dentures?: No Does patient usually wear dentures?: No  CIWA:  CIWA-Ar Total: 1 COWS:  COWS Total Score: 2  Psychiatric Specialty Exam: See Psychiatric Specialty Exam and Suicide Risk Assessment completed by Attending Physician prior to discharge.  Discharge destination:  Home  Is patient on multiple antipsychotic therapies at discharge:  No   Has Patient had three or more failed trials of antipsychotic monotherapy by history:  No  Recommended Plan for Multiple Antipsychotic Therapies: NA  Discharge Instructions   Discharge instructions    Complete by:  As directed   Please follow up with your Primary Care Provider for further management of chronic medical problems such as Hypertension.            Medication List    STOP taking these medications       gabapentin 600 MG tablet  Commonly known as:  NEURONTIN  Replaced by:  gabapentin 300 MG capsule      TAKE these medications     Indication   benztropine 0.5 MG tablet  Commonly known as:  COGENTIN  Take 1 tablet (0.5 mg  total) by mouth daily at 8 pm.   Indication:  Extrapyramidal Reaction caused by Medications     gabapentin 300 MG capsule  Commonly known as:  NEURONTIN  Take 2 capsules (600 mg total) by mouth daily at 8 pm.   Indication:  Anxiety, Mood control     hydrochlorothiazide 25 MG tablet  Commonly known as:  HYDRODIURIL  Take 0.5 tablets (12.5 mg total) by mouth daily.   Indication:  High Blood Pressure     lamiVUDine-zidovudine 150-300 MG per tablet  Commonly known as:  COMBIVIR  Take 1 tablet by mouth 2 (two)  times daily.   Indication:  HIV Disease     lopinavir-ritonavir 200-50 MG per tablet  Commonly known as:  KALETRA  Take 2 tablets by mouth 2 (two) times daily.   Indication:  HIV Disease     mirtazapine 45 MG tablet  Commonly known as:  REMERON  Take 1 tablet (45 mg total) by mouth daily at 8 pm.   Indication:  Trouble Sleeping, Major Depressive Disorder     multivitamin with minerals Tabs tablet  Take 1 tablet by mouth daily.   Indication:  Vitamin Supplementation     risperidone 4 MG tablet  Commonly known as:  RISPERDAL  Take 1 tablet (4 mg total) by mouth daily at 8 pm.   Indication:  Schizophrenia           Follow-up Information   Follow up with [facility]. (For your first visit you will need to go to the walk-in clinic M-F between 8 and 10 AM fro your hospital follow up appointment.  After that you will get an appointment.)    Contact information:   [place] [phone]     Follow-up recommendations:   Activity: No restrictions   Comments:   Take all your medications as prescribed by your mental healthcare provider.  Report any adverse effects and or reactions from your medicines to your outpatient provider promptly.  Patient is instructed and cautioned to not engage in alcohol and or illegal drug use while on prescription medicines.  In the event of worsening symptoms, patient is instructed to call the crisis hotline, 911 and or go to the nearest ED for appropriate evaluation and treatment of symptoms.  Follow-up with your primary care provider for your other medical issues, concerns and or health care needs.   Total Discharge Time:  Greater than 30 minutes.  SignedElmarie Shiley NP-C 07/08/2014, 9:34 AM

## 2014-07-13 NOTE — Progress Notes (Signed)
Patient Discharge Instructions:  After Visit Summary (AVS):   Faxed to:  07/13/14 Discharge Summary Note:   Faxed to:  07/13/14 Psychiatric Admission Assessment Note:   Faxed to:  07/13/14 Suicide Risk Assessment - Discharge Assessment:   Faxed to:  07/13/14 Faxed/Sent to the Next Level Care provider:  07/13/14 Faxed to [facility] [place], 07/13/2014, 2:09 PM

## 2014-09-24 ENCOUNTER — Encounter (HOSPITAL_COMMUNITY): Payer: Self-pay

## 2014-09-24 ENCOUNTER — Emergency Department (HOSPITAL_COMMUNITY)
Admission: EM | Admit: 2014-09-24 | Discharge: 2014-09-26 | Disposition: A | Discharge status: Home / Self Care | Payer: Medicare HMO | Attending: Emergency Medicine | Admitting: Emergency Medicine

## 2014-09-24 DIAGNOSIS — Z720 Tobacco use: Secondary | ICD-10-CM | POA: Diagnosis not present

## 2014-09-24 DIAGNOSIS — F1490 Cocaine use, unspecified, uncomplicated: Secondary | ICD-10-CM | POA: Diagnosis not present

## 2014-09-24 DIAGNOSIS — I10 Essential (primary) hypertension: Secondary | ICD-10-CM | POA: Diagnosis not present

## 2014-09-24 DIAGNOSIS — R443 Hallucinations, unspecified: Secondary | ICD-10-CM | POA: Diagnosis present

## 2014-09-24 DIAGNOSIS — F329 Major depressive disorder, single episode, unspecified: Secondary | ICD-10-CM | POA: Insufficient documentation

## 2014-09-24 DIAGNOSIS — Z79899 Other long term (current) drug therapy: Secondary | ICD-10-CM | POA: Diagnosis not present

## 2014-09-24 DIAGNOSIS — Z880 Allergy status to penicillin: Secondary | ICD-10-CM | POA: Insufficient documentation

## 2014-09-24 DIAGNOSIS — Z21 Asymptomatic human immunodeficiency virus [HIV] infection status: Secondary | ICD-10-CM | POA: Insufficient documentation

## 2014-09-24 DIAGNOSIS — F200 Paranoid schizophrenia: Secondary | ICD-10-CM | POA: Diagnosis not present

## 2014-09-24 DIAGNOSIS — F1410 Cocaine abuse, uncomplicated: Secondary | ICD-10-CM | POA: Diagnosis not present

## 2014-09-24 DIAGNOSIS — Z590 Homelessness: Secondary | ICD-10-CM | POA: Diagnosis not present

## 2014-09-24 DIAGNOSIS — F1010 Alcohol abuse, uncomplicated: Secondary | ICD-10-CM | POA: Diagnosis present

## 2014-09-24 DIAGNOSIS — F10129 Alcohol abuse with intoxication, unspecified: Secondary | ICD-10-CM | POA: Insufficient documentation

## 2014-09-24 DIAGNOSIS — F10929 Alcohol use, unspecified with intoxication, unspecified: Secondary | ICD-10-CM

## 2014-09-24 MED ORDER — MIRTAZAPINE 30 MG PO TABS
45.0000 mg | ORAL_TABLET | Freq: Every day | ORAL | Status: DC
  Administered 2014-09-24 – 2014-09-25 (×2): 45 mg via ORAL
  Filled 2014-09-24 (×2): qty 2

## 2014-09-24 MED ORDER — GABAPENTIN 300 MG PO CAPS
600.0000 mg | ORAL_CAPSULE | Freq: Every day | ORAL | Status: DC
  Administered 2014-09-24 – 2014-09-25 (×2): 600 mg via ORAL
  Filled 2014-09-24 (×2): qty 2

## 2014-09-24 MED ORDER — HYDROCHLOROTHIAZIDE 12.5 MG PO CAPS
12.5000 mg | ORAL_CAPSULE | Freq: Every day | ORAL | Status: DC
  Administered 2014-09-24 – 2014-09-26 (×3): 12.5 mg via ORAL
  Filled 2014-09-24 (×3): qty 1

## 2014-09-24 MED ORDER — BENZTROPINE MESYLATE 1 MG PO TABS
0.5000 mg | ORAL_TABLET | Freq: Every day | ORAL | Status: DC
  Administered 2014-09-24 – 2014-09-25 (×2): 0.5 mg via ORAL
  Filled 2014-09-24 (×2): qty 1

## 2014-09-24 MED ORDER — RISPERIDONE 2 MG PO TABS
4.0000 mg | ORAL_TABLET | Freq: Every day | ORAL | Status: DC
  Administered 2014-09-24 – 2014-09-25 (×2): 4 mg via ORAL
  Filled 2014-09-24 (×2): qty 2

## 2014-09-24 MED ORDER — LOPINAVIR-RITONAVIR 200-50 MG PO TABS
2.0000 | ORAL_TABLET | Freq: Two times a day (BID) | ORAL | Status: DC
  Administered 2014-09-24 – 2014-09-26 (×4): 2 via ORAL
  Filled 2014-09-24 (×5): qty 2

## 2014-09-24 MED ORDER — LAMIVUDINE-ZIDOVUDINE 150-300 MG PO TABS
1.0000 | ORAL_TABLET | Freq: Two times a day (BID) | ORAL | Status: DC
  Administered 2014-09-25 – 2014-09-26 (×4): 1 via ORAL
  Filled 2014-09-24 (×5): qty 1

## 2014-09-24 NOTE — ED Notes (Signed)
Resting quietly with eye closed. Easily arousable. Verbally responsive. Resp even and unlabored. ABC's intact. NAD noted.  

## 2014-09-24 NOTE — ED Notes (Signed)
Awake. Verbally responsive. A/O x4. Resp even and unlabored. No audible adventitious breath sounds noted. ABC's intact. No behavior problems noted.

## 2014-09-24 NOTE — ED Notes (Signed)
[facility] called and reported that pt meets in-pt criteria.

## 2014-09-24 NOTE — ED Notes (Addendum)
Resting quietly with eye closed. Easily arousable. Verbally responsive. Resp even and unlabored. ABC's intact. NAD noted.  

## 2014-09-24 NOTE — ED Notes (Signed)
Bed: [facility] Expected date:  Expected time:  Means of arrival:  Comments: EMS 53 yo hallucinating/bizarre behavior
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
## 2014-09-25 DIAGNOSIS — F1420 Cocaine dependence, uncomplicated: Secondary | ICD-10-CM

## 2014-09-25 DIAGNOSIS — F200 Paranoid schizophrenia: Secondary | ICD-10-CM

## 2014-09-25 LAB — COMPREHENSIVE METABOLIC PANEL
ALBUMIN: 3.8 g/dL (ref 3.5–5.2)
ALT: 19 U/L (ref 0–53)
AST: 30 U/L (ref 0–37)
Alkaline Phosphatase: 60 U/L (ref 39–117)
Anion gap: 12 (ref 5–15)
BUN: 13 mg/dL (ref 6–23)
CALCIUM: 9.3 mg/dL (ref 8.4–10.5)
CO2: 21 mmol/L (ref 19–32)
CREATININE: 0.72 mg/dL (ref 0.50–1.35)
Chloride: 111 mEq/L (ref 96–112)
GFR calc Af Amer: >90 mL/min (ref >90)
GFR calc non Af Amer: >90 mL/min (ref >90)
Glucose, Bld: 74 mg/dL (ref 70–99)
Potassium: 3.7 mmol/L (ref 3.5–5.1)
Sodium: 144 mmol/L (ref 135–145)
TOTAL PROTEIN: 7.1 g/dL (ref 6.0–8.3)
Total Bilirubin: 0.6 mg/dL (ref 0.3–1.2)

## 2014-09-25 LAB — RAPID URINE DRUG SCREEN, HOSP PERFORMED
Amphetamines: NOT DETECTED
BENZODIAZEPINES: NOT DETECTED
Barbiturates: NOT DETECTED
Cocaine: POSITIVE — AB
OPIATES: NOT DETECTED
Tetrahydrocannabinol: NOT DETECTED

## 2014-09-25 LAB — CBC
HCT: 43.4 % (ref 39.0–52.0)
Hemoglobin: 14.2 g/dL (ref 13.0–17.0)
MCH: 36 pg — AB (ref 26.0–34.0)
MCHC: 32.7 g/dL (ref 30.0–36.0)
MCV: 110.2 fL — ABNORMAL HIGH (ref 78.0–100.0)
PLATELETS: 128 10*3/uL — AB (ref 150–400)
RBC: 3.94 MIL/uL — ABNORMAL LOW (ref 4.22–5.81)
RDW: 13.7 % (ref 11.5–15.5)
WBC: 3.9 10*3/uL — AB (ref 4.0–10.5)

## 2014-09-25 LAB — ETHANOL: ALCOHOL ETHYL (B): 151 mg/dL — AB (ref 0–9)

## 2014-09-25 MED ORDER — LOPINAVIR-RITONAVIR 200-50 MG PO TABS: 2.0000 | ORAL_TABLET | Freq: Two times a day (BID) | ORAL | Status: DC

## 2014-09-25 MED ORDER — LAMIVUDINE-ZIDOVUDINE 150-300 MG PO TABS: 1.0000 | ORAL_TABLET | Freq: Two times a day (BID) | ORAL | Status: DC

## 2014-09-25 NOTE — ED Notes (Signed)
Pt resting at present, no distress noted, will continue to monitor for safety. 

## 2014-09-25 NOTE — ED Notes (Signed)
Pt awake. Ambulated to BR with steady gait and changed into behavior health clothing. Personal clothing (pants, coat, wallet, shoes, socks and 2 cell phones) removed, bagged, and labeled.

## 2014-09-25 NOTE — ED Notes (Signed)
Pt moved to room BH 35.

## 2014-09-25 NOTE — ED Notes (Signed)
Resting quietly with eye closed. Easily arousable. Verbally responsive. Resp even and unlabored. ABC's intact. NAD noted. No behavior problems noted.

## 2014-09-25 NOTE — BH Assessment (Signed)
[facility] Assessment Progress Note  The following attempts have been made to place this pt with results as noted:  *[facility]: at capacity *[facility]: at capacity *[facility]: at [place]: beds are available, referral faxed with decision pending *[facility]: beds are available, referral faxed with decision pending; currently in communication about potential acceptance.  Jalene Mullet, MA Triage Specialist 09/25/2014 @ 16:34

## 2014-09-25 NOTE — ED Notes (Signed)
Pt refused to participate with TTS.

## 2014-09-25 NOTE — ED Notes (Signed)
Head to toe skin check is WDL.  No tears.lacerations or wounds.

## 2014-09-25 NOTE — ED Notes (Signed)
Resting quietly with eye closed. Easily arousable. Verbally responsive. Resp even and unlabored. ABC's intact. NAD noted.  
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
## 2014-09-25 NOTE — BH Assessment (Signed)
[name] Herring, [facility] at [facility], confirms adult unit is at capacity. Contacted the following facilities for placement:  BED AVAILABLE, FAXED CLINICAL INFORMATION: [facility], per [facility] per [facility], per [facility], per [facility], per [employer]  AT CAPACITY: [facility], per [facility], per [facility], per [employer] [facility], per [facility], per [facility], per [employer] [facility], per [facility], per [facility], per [facility], per [facility], per [facility], per [facility], per [facility], per [facility] RESPONSE: [facility]   [place], [place], [facility] Triage Specialist [phone]

## 2014-09-25 NOTE — ED Notes (Signed)
Resting quietly with eye closed. Easily arousable. Verbally responsive. Resp even and unlabored. ABC's intact. Family at bedside. NAD noted.

## 2014-09-25 NOTE — ED Notes (Signed)
Resting quietly with eye closed. Easily arousable. Verbally responsive. Resp even and unlabored. ABC's intact. No behavior problems noted. NAD noted.

## 2014-09-26 DIAGNOSIS — F1010 Alcohol abuse, uncomplicated: Secondary | ICD-10-CM | POA: Diagnosis not present

## 2014-09-26 DIAGNOSIS — F1490 Cocaine use, unspecified, uncomplicated: Secondary | ICD-10-CM | POA: Diagnosis not present

## 2014-09-26 DIAGNOSIS — F200 Paranoid schizophrenia: Secondary | ICD-10-CM | POA: Diagnosis not present

## 2014-09-26 DIAGNOSIS — F10929 Alcohol use, unspecified with intoxication, unspecified: Secondary | ICD-10-CM | POA: Insufficient documentation

## 2014-09-26 DIAGNOSIS — F1410 Cocaine abuse, uncomplicated: Secondary | ICD-10-CM | POA: Diagnosis present

## 2014-09-26 MED ORDER — BENZTROPINE MESYLATE 0.5 MG PO TABS: 0.5000 mg | ORAL_TABLET | Freq: Every day | ORAL | Status: DC

## 2014-09-26 MED ORDER — LAMIVUDINE-ZIDOVUDINE 150-300 MG PO TABS: 1.0000 | ORAL_TABLET | Freq: Two times a day (BID) | ORAL | Status: DC

## 2014-09-26 MED ORDER — HYDROCHLOROTHIAZIDE 25 MG PO TABS: 12.5000 mg | ORAL_TABLET | Freq: Every day | ORAL | Status: DC

## 2014-09-26 MED ORDER — GABAPENTIN 300 MG PO CAPS: 600.0000 mg | ORAL_CAPSULE | Freq: Every day | ORAL | Status: DC

## 2014-09-26 MED ORDER — LOPINAVIR-RITONAVIR 200-50 MG PO TABS: 2.0000 | ORAL_TABLET | Freq: Two times a day (BID) | ORAL | Status: DC

## 2014-09-26 MED ORDER — RISPERIDONE 4 MG PO TABS: 4.0000 mg | ORAL_TABLET | Freq: Every day | ORAL | Status: DC

## 2014-09-26 MED ORDER — MIRTAZAPINE 45 MG PO TABS: 45.0000 mg | ORAL_TABLET | Freq: Every day | ORAL | Status: DC

## 2014-09-26 MED ORDER — ADULT MULTIVITAMIN W/MINERALS CH: 1.0000 | ORAL_TABLET | Freq: Every day | ORAL | Status: DC

## 2014-09-26 NOTE — BHH Suicide Risk Assessment (Signed)
Suicide Risk Assessment  Discharge Assessment     Demographic Factors:  Male  Total Time spent with patient: 30 minutes  Psychiatric Specialty Exam:     Blood pressure 123/84, pulse 59, temperature 97.5 F (36.4 C), temperature source Oral, resp. rate 18, height 5\' 10"  (1.778 m), weight 145 lb (65.772 kg), SpO2 96 %.Body mass index is 20.81 kg/(m^2).  General Appearance: Dishelved  Eye Contact::  Good  Speech:  Normal  Volume:  Normal  Mood:  Euthymic  Affect:  Congruent  Thought Process:  Clear and coherent  Orientation:  Full (Time, Place, and Person)  Thought Content:  WDL  Suicidal Thoughts:  No  Homicidal Thoughts:  No  Memory:  Fair  Judgement:  Fair  Insight:  Fair  Psychomotor Activity:  Normal  Concentration:  Good  Recall:  [place]pson of Knowledge:  Fair  Language: Fair  Akathisia:  Negative  Handed:  Right  AIMS (if indicated):     Assets:  Desire for Improvement  Sleep:      Musculoskeletal: Strength & Muscle Tone: within normal limits Gait & Station: appropriate gait and balance Patient leans: N/A  Mental Status Per Nursing Assessment::   On Admission:   psychosis, inebriated  Current Mental Status by Physician:  NA  Loss Factors: NA  Historical Factors: NA  Risk Reduction Factors:   Positive social support and Positive therapeutic relationship  Continued Clinical Symptoms:  Substance abuse issues  Cognitive Features That Contribute To Risk:  None  Suicide Risk:  Minimal: No identifiable suicidal ideation.  Patients presenting with no risk factors but with morbid ruminations; may be classified as minimal risk based on the severity of the depressive symptoms  Discharge Diagnoses:   AXIS I:  Chronic Paranoid Schizophrenia and Substance Abuse; cocaine abuse; alcohol abuse AXIS II:  Deferred AXIS III:   Past Medical History  Diagnosis Date  . Hypertension   . HIV (human immunodeficiency virus infection)   . Schizophrenia   .  Depression    AXIS IV:  other psychosocial or environmental problems and problems related to social environment AXIS V:  61-70 mild symptoms  Plan Of Care/Follow-up recommendations:  Activity:  as tolerated Diet:  heart healthy   Is patient on multiple antipsychotic therapies at discharge:  No   Has Patient had three or more failed trials of antipsychotic monotherapy by history:  No  Recommended Plan for Multiple Antipsychotic Therapies: NA    Silver Achey, PMH-NP 09/26/2014, 11:12 AM

## 2014-09-26 NOTE — Consult Note (Signed)
[facility] Face-to-Face Psychiatry Consult   Reason for Consult:  paranoia Referring Physician:  ED Physician  [name] Herring is an 54 y.o. male. Total Time spent with patient: 30 minutes  Assessment: AXIS I:  Chronic Paranoid Schizophrenia, Rule out Substance dependence and Substance Abuse. Cocaine use disorder, moderate; alcohol abuse AXIS II:  Deferred AXIS III:   Past Medical History  Diagnosis Date  . Hypertension   . HIV (human immunodeficiency virus infection)   . Schizophrenia   . Depression    AXIS IV:  educational problems, occupational problems and other psychosocial or environmental problems AXIS V:  70; mild symptoms Review of Systems  Constitutional: Negative.   HENT: Negative.   Eyes: Negative.   Respiratory: Negative.   Cardiovascular: Negative.   Gastrointestinal: Negative.   Genitourinary: Negative.   Musculoskeletal: Negative.   Skin: Negative.   Neurological: Negative.   Endo/Heme/Allergies: Negative.   Psychiatric/Behavioral: Positive for substance abuse.   Plan:  Patient to be discharged and follow up with recommendations and [facility]  Subjective:   [name] Herring is a 54 y.o. male patient admitted with paranoia and feeling people are torturing him.  HPI:  Patient has cleared from his alcohol and drug use.  His antipsychotic medications were started and he has stabilized.  He requests to leave and return to his support groups.  Darcey needs his psychiatric medications per his request; has not had them for three months.  He moved from [place] to [place] and initially went to [place] but got tired of waiting and left.  Denies suicidal/homicidal ideations and hallucinations.  Stable for discharge.  Past Psychiatric History: Past Medical History  Diagnosis Date  . Hypertension   . HIV (human immunodeficiency virus infection)   . Schizophrenia   . Depression     reports that he has been smoking Cigarettes.  He has been smoking about 1.00 pack per day.  He does not have any smokeless tobacco history on file. He reports that he drinks alcohol. He reports that he uses illicit drugs (Cocaine). No family history on file. Family History Substance Abuse: No Family Supports: No (Unable to assess, Pt refused to speak) Living Arrangements: Other (Comment) (Homeless) Can pt return to current living arrangement?: Yes Abuse/Neglect [facility]) Physical Abuse: Denies Verbal Abuse: Denies Sexual Abuse: Denies Allergies:   Allergies  Allergen Reactions  . Amoxicillin Rash    ACT Assessment Complete:  Yes:    Educational Status    Risk to Self: Risk to self with the past 6 months Suicidal Ideation: No (Pt refused to speak) Suicidal Intent: No (Pt refused to speak) Is patient at risk for suicide?: Yes (History of command hallucinations to kill himself) Suicidal Plan?: No (Unable to assess, Pt refused to speak) Access to Means: No What has been your use of drugs/alcohol within the last 12 months?: Pt has history of using alcohol and cocaine Previous Attempts/Gestures: Yes How many times?: 12 (History of OD) Other Self Harm Risks: Pt has history of command auditory hallucinations Triggers for Past Attempts: Hallucinations Intentional Self Injurious Behavior: None Family Suicide History: No Recent stressful life event(s): Other (Comment) (Homeless) Persecutory voices/beliefs?: Yes Depression: Yes Depression Symptoms: Despondent, Isolating Substance abuse history and/or treatment for substance abuse?: Yes Suicide prevention information given to non-admitted patients: Not applicable  Risk to Others: Risk to Others within the past 6 months Homicidal Ideation: No (Unable to assess, Pt refused to speak) Thoughts of Harm to Others: No (Unable to assess, Pt refused to speak)  Current Homicidal Intent: No (Unable to assess, Pt refused to speak) Current Homicidal Plan: No (Unable to assess, Pt refused to speak) Access to Homicidal Means: No (Unable to  assess, Pt refused to speak) Identified Victim: none (Unable to assess, Pt refused to speak) History of harm to others?: No Assessment of Violence: None Noted Violent Behavior Description: No known history of violence Does patient have access to weapons?: No (Unable to assess, Pt refused to speak) Criminal Charges Pending?: No (Unable to assess, Pt refused to speak) Does patient have a court date: No (Unable to assess, Pt refused to speak)  Abuse: Abuse/Neglect Assessment (Assessment to be complete while patient is alone) Physical Abuse: Denies Verbal Abuse: Denies Sexual Abuse: Denies Exploitation of patient/patient's resources: Denies  Prior Inpatient Therapy: Prior Inpatient Therapy Prior Inpatient Therapy: Yes Prior Therapy Dates: 06/2014; multiple admissions Prior Therapy Facilty/Provider(s): [facility]; [facility] Reason for Treatment: Schizophrenia  Prior Outpatient Therapy: Prior Outpatient Therapy Prior Outpatient Therapy: Yes Prior Therapy Dates: Unknown Prior Therapy Facilty/Provider(s): [facility] Reason for Treatment: Schisophrenia  Additional Information: Additional Information 1:1 In Past 12 Months?: No CIRT Risk: No Elopement Risk: No Does patient have medical clearance?: Yes                  Objective: Blood pressure 123/84, pulse 59, temperature 97.5 F (36.4 C), temperature source Oral, resp. rate 18, height '5\' 10"'  (1.778 m), weight 145 lb (65.772 kg), SpO2 96 %.Body mass index is 20.81 kg/(m^2). Results for orders placed or performed during the hospital encounter of 09/24/14 (from the past 72 hour(s))  CBC     Status: Abnormal   Collection Time: 09/24/14 11:28 PM  Result Value Ref Range   WBC 3.9 (L) 4.0 - 10.5 K/uL   RBC 3.94 (L) 4.22 - 5.81 MIL/uL   Hemoglobin 14.2 13.0 - 17.0 g/dL   HCT 43.4 39.0 - 52.0 %   MCV 110.2 (H) 78.0 - 100.0 fL   MCH 36.0 (H) 26.0 - 34.0 pg   MCHC 32.7 30.0 - 36.0 g/dL   RDW 13.7 11.5 -  15.5 %   Platelets 128 (L) 150 - 400 K/uL  Comprehensive metabolic panel     Status: None   Collection Time: 09/24/14 11:28 PM  Result Value Ref Range   Sodium 144 135 - 145 mmol/L    Comment: Please note change in reference range.   Potassium 3.7 3.5 - 5.1 mmol/L    Comment: Please note change in reference range.   Chloride 111 96 - 112 mEq/L   CO2 21 19 - 32 mmol/L   Glucose, Bld 74 70 - 99 mg/dL   BUN 13 6 - 23 mg/dL   Creatinine, Ser 0.72 0.50 - 1.35 mg/dL   Calcium 9.3 8.4 - 10.5 mg/dL   Total Protein 7.1 6.0 - 8.3 g/dL   Albumin 3.8 3.5 - 5.2 g/dL   AST 30 0 - 37 U/L   ALT 19 0 - 53 U/L   Alkaline Phosphatase 60 39 - 117 U/L   Total Bilirubin 0.6 0.3 - 1.2 mg/dL   GFR calc non Af Amer >90 >90 mL/min   GFR calc Af Amer >90 >90 mL/min    Comment: (NOTE) The eGFR has been calculated using the CKD EPI equation. This calculation has not been validated in all clinical situations. eGFR's persistently <90 mL/min signify possible Chronic Kidney Disease.    Anion gap 12 5 - 15  Ethanol (ETOH)  Status: Abnormal   Collection Time: 09/24/14 11:28 PM  Result Value Ref Range   Alcohol, Ethyl (B) 151 (H) 0 - 9 mg/dL    Comment:        LOWEST DETECTABLE LIMIT FOR SERUM ALCOHOL IS 11 mg/dL FOR MEDICAL PURPOSES ONLY   Urine Drug Screen     Status: Abnormal   Collection Time: 09/25/14  6:45 AM  Result Value Ref Range   Opiates NONE DETECTED NONE DETECTED   Cocaine POSITIVE (A) NONE DETECTED   Benzodiazepines NONE DETECTED NONE DETECTED   Amphetamines NONE DETECTED NONE DETECTED   Tetrahydrocannabinol NONE DETECTED NONE DETECTED   Barbiturates NONE DETECTED NONE DETECTED    Comment:        DRUG SCREEN FOR MEDICAL PURPOSES ONLY.  IF CONFIRMATION IS NEEDED FOR ANY PURPOSE, NOTIFY LAB WITHIN 5 DAYS.        LOWEST DETECTABLE LIMITS FOR URINE DRUG SCREEN Drug Class       Cutoff (ng/mL) Amphetamine      1000 Barbiturate      200 Benzodiazepine   675 Tricyclics        916 Opiates          300 Cocaine          300 THC              50    Labs are reviewed and are pertinent for cocaine positive and high alcohol level.  Current Facility-Administered Medications  Medication Dose Route Frequency Provider Last Rate Last Dose  . benztropine (COGENTIN) tablet 0.5 mg  0.5 mg Oral Q2000 Mirna Mires, MD   0.5 mg at 09/25/14 2116  . gabapentin (NEURONTIN) capsule 600 mg  600 mg Oral Q2000 Mirna Mires, MD   600 mg at 09/25/14 2117  . hydrochlorothiazide (MICROZIDE) capsule 12.5 mg  12.5 mg Oral Daily Mirna Mires, MD   12.5 mg at 09/26/14 3846  . lamiVUDine-zidovudine (COMBIVIR) 150-300 MG per tablet 1 tablet  1 tablet Oral BID Mirna Mires, MD   1 tablet at 09/26/14 [phone]  . lopinavir-ritonavir (KALETRA) 200-50 MG per tablet 2 tablet  2 tablet Oral BID Mirna Mires, MD   2 tablet at 09/26/14 [phone]  . mirtazapine (REMERON) tablet 45 mg  45 mg Oral Q2000 Mirna Mires, MD   45 mg at 09/25/14 2118  . risperiDONE (RISPERDAL) tablet 4 mg  4 mg Oral Q2000 Mirna Mires, MD   4 mg at 09/25/14 2119   Current Outpatient Prescriptions  Medication Sig Dispense Refill  . acetaminophen (TYLENOL) 500 MG tablet Take 1,000 mg by mouth every 6 (six) hours as needed for headache.    . gabapentin (NEURONTIN) 300 MG capsule Take 2 capsules (600 mg total) by mouth daily at 8 pm. 60 capsule 0  . lamiVUDine-zidovudine (COMBIVIR) 150-300 MG per tablet Take 1 tablet by mouth 2 (two) times daily.    Marland Kitchen lopinavir-ritonavir (KALETRA) 200-50 MG per tablet Take 2 tablets by mouth 2 (two) times daily.    . risperiDONE (RISPERDAL) 4 MG tablet Take 1 tablet (4 mg total) by mouth daily at 8 pm. 30 tablet 0  . benztropine (COGENTIN) 0.5 MG tablet Take 1 tablet (0.5 mg total) by mouth daily at 8 pm. (Patient not taking: Reported on 09/25/2014) 30 tablet 0  . hydrochlorothiazide (HYDRODIURIL) 25 MG tablet Take 0.5 tablets (12.5 mg total) by mouth daily. (Patient not taking: Reported on  09/25/2014)    . lamiVUDine-zidovudine (  COMBIVIR) 150-300 MG per tablet Take 1 tablet by mouth 2 (two) times daily. 30 tablet 0  . lopinavir-ritonavir (KALETRA) 200-50 MG per tablet Take 2 tablets by mouth 2 (two) times daily. 60 tablet 0  . mirtazapine (REMERON) 45 MG tablet Take 1 tablet (45 mg total) by mouth daily at 8 pm. (Patient not taking: Reported on 09/25/2014) 30 tablet 0  . Multiple Vitamin (MULTIVITAMIN WITH MINERALS) TABS tablet Take 1 tablet by mouth daily. (Patient not taking: Reported on 09/25/2014)      Psychiatric Specialty Exam:     Blood pressure 123/84, pulse 59, temperature 97.5 F (36.4 C), temperature source Oral, resp. rate 18, height '5\' 10"'  (1.778 m), weight 145 lb (65.772 kg), SpO2 96 %.Body mass index is 20.81 kg/(m^2).  General Appearance: Dishelved  Eye Contact::  Good  Speech:  Normal  Volume:  Normal  Mood:  Euthymic  Affect:  Congruent  Thought Process:  Clear and coherent  Orientation:  Full (Time, Place, and Person)  Thought Content:  WDL  Suicidal Thoughts:  No  Homicidal Thoughts:  No  Memory:  Fair  Judgement:  Fair  Insight:  Fair  Psychomotor Activity:  Normal  Concentration:  Good  Recall:  [place] of Knowledge:  Fair  Language: Fair  Akathisia:  Negative  Handed:  Right  AIMS (if indicated):     Assets:  Desire for Improvement  Sleep:      Musculoskeletal: Strength & Muscle Tone: within normal limits Gait & Station: appropriate gait and balance Patient leans: N/A  Treatment Plan Summary: Discharge home with Rx and follow-up with [facility].  Waylan Boga, [place] 09/26/2014 10:38 AM  I have personally seen the patient and agreed with the findings and involved in the treatment plan. Merian Capron, MD

## 2014-09-26 NOTE — ED Notes (Signed)
Up to the bathroom 

## 2014-09-26 NOTE — ED Notes (Addendum)
Written dc instructions and prescriptions reviewed w/ patient.  Pt encouraged to take his medications as directed, and return for any return of suicidal/homicidal thoughts or urges.  Pt verbalized understanding.  Bus pass given.  Pt ambulatory to dc window w/ mHt, belongings returned after leaving the area.

## 2014-12-25 ENCOUNTER — Emergency Department (EMERGENCY_DEPARTMENT_HOSPITAL)
Admission: EM | Admit: 2014-12-25 | Discharge: 2014-12-26 | Disposition: A | Discharge status: Other Acute Inpatient Hospital | Payer: Medicare HMO | Source: Home / Self Care | Attending: Emergency Medicine | Admitting: Emergency Medicine

## 2014-12-25 DIAGNOSIS — Z880 Allergy status to penicillin: Secondary | ICD-10-CM | POA: Insufficient documentation

## 2014-12-25 DIAGNOSIS — F200 Paranoid schizophrenia: Secondary | ICD-10-CM | POA: Insufficient documentation

## 2014-12-25 DIAGNOSIS — F329 Major depressive disorder, single episode, unspecified: Secondary | ICD-10-CM | POA: Diagnosis not present

## 2014-12-25 DIAGNOSIS — Z79899 Other long term (current) drug therapy: Secondary | ICD-10-CM | POA: Insufficient documentation

## 2014-12-25 DIAGNOSIS — I10 Essential (primary) hypertension: Secondary | ICD-10-CM | POA: Insufficient documentation

## 2014-12-25 DIAGNOSIS — F1410 Cocaine abuse, uncomplicated: Secondary | ICD-10-CM | POA: Insufficient documentation

## 2014-12-25 DIAGNOSIS — Z720 Tobacco use: Secondary | ICD-10-CM | POA: Insufficient documentation

## 2014-12-25 DIAGNOSIS — F29 Unspecified psychosis not due to a substance or known physiological condition: Secondary | ICD-10-CM | POA: Diagnosis not present

## 2014-12-25 DIAGNOSIS — Z21 Asymptomatic human immunodeficiency virus [HIV] infection status: Secondary | ICD-10-CM | POA: Insufficient documentation

## 2014-12-25 NOTE — ED Provider Notes (Addendum)
CSN: [id number]     Arrival date & time 12/25/14  2319 History  This chart was scribed for non-physician practitioner Junius Creamer, PA-C working with Jola Schmidt, MD by Rayfield Citizen, ED Scribe. This patient was seen in room WTR4/WLPT4 and the patient's care was started at 11:55 PM.    Chief Complaint  Patient presents with  . Suicidal   The history is provided by the patient. No language interpreter was used.    HPI Comments: [name] Herring is a 54 y.o. male with past medical history of HTN, HIV, schizophrenia, depression who presents to the Emergency Department complaining of auditory hallucinations that "never stop" and visual hallucinations. Patient reports he is hearing voices that tell him to harm himself in "several different ways; hang myself, drown myself."   Patient states he is taking all psych medications as prescribed; his last hospitalization for psych concerns was in 2015. He lives in a duplex on [address] at this time. Care managed at [facility], next appointment June 9th.  Past Medical History  Diagnosis Date  . Hypertension   . HIV (human immunodeficiency virus infection)   . Schizophrenia   . Depression    History reviewed. No pertinent past surgical history. History reviewed. No pertinent family history. History  Substance Use Topics  . Smoking status: Current Every Day Smoker -- 1.00 packs/day    Types: Cigarettes  . Smokeless tobacco: Not on file  . Alcohol Use: Yes     Comment: Daily     Review of Systems  Constitutional: Negative for fever.  Respiratory: Negative for cough.   Neurological: Negative for headaches.  Psychiatric/Behavioral: Positive for suicidal ideas and hallucinations.  All other systems reviewed and are negative.     Allergies  Amoxicillin  Home Medications   Prior to Admission medications   Medication Sig Start Date End Date Taking? Authorizing Provider  benztropine (COGENTIN) 0.5 MG tablet Take 1 tablet (0.5 mg total) by mouth  daily at 8 pm. 09/26/14  Yes Patrecia Pour, NP  gabapentin (NEURONTIN) 300 MG capsule Take 2 capsules (600 mg total) by mouth daily at 8 pm. 09/26/14  Yes Patrecia Pour, NP  hydrochlorothiazide (HYDRODIURIL) 25 MG tablet Take 0.5 tablets (12.5 mg total) by mouth daily. 09/26/14  Yes Patrecia Pour, NP  lamiVUDine-zidovudine (COMBIVIR) 150-300 MG per tablet Take 1 tablet by mouth 2 (two) times daily. 09/25/14  Yes Leonard Schwartz, MD  lopinavir-ritonavir (KALETRA) 200-50 MG per tablet Take 2 tablets by mouth 2 (two) times daily. 09/26/14  Yes Patrecia Pour, NP  mirtazapine (REMERON) 45 MG tablet Take 1 tablet (45 mg total) by mouth daily at 8 pm. 09/26/14  Yes Patrecia Pour, NP  Multiple Vitamin (MULTIVITAMIN WITH MINERALS) TABS tablet Take 1 tablet by mouth daily. 09/26/14  Yes Patrecia Pour, NP  risperidone (RISPERDAL) 4 MG tablet Take 1 tablet (4 mg total) by mouth daily at 8 pm. 09/26/14  Yes Patrecia Pour, NP   BP 134/79 mmHg  Pulse 64  Temp(Src) 98.1 F (36.7 C) (Oral)  Resp 18  SpO2 96% Physical Exam  Constitutional: He is oriented to person, place, and time. He appears well-developed and well-nourished.  HENT:  Head: Normocephalic and atraumatic.  Eyes: Pupils are equal, round, and reactive to light.  Neck: No tracheal deviation present.  Cardiovascular: Normal rate.   Pulmonary/Chest: Effort normal.  Musculoskeletal: Normal range of motion.  Neurological: He is alert and oriented to person, place, and time.  Skin: Skin is warm and dry.  Nursing note and vitals reviewed.   ED Course  Procedures   DIAGNOSTIC STUDIES: Oxygen Saturation is 98% on RA, normal by my interpretation.    COORDINATION OF CARE: 11:59 PM Discussed treatment plan with pt at bedside and pt agreed to plan.   Labs Review Labs Reviewed  CBC WITH DIFFERENTIAL/PLATELET - Abnormal; Notable for the following:    RBC 3.76 (*)    MCV 108.5 (*)    MCH 37.5 (*)    All other components within normal limits  URINE  RAPID DRUG SCREEN (HOSP PERFORMED) - Abnormal; Notable for the following:    Cocaine POSITIVE (*)    All other components within normal limits  ETHANOL - Abnormal; Notable for the following:    Alcohol, Ethyl (B) 84 (*)    All other components within normal limits  I-STAT CHEM 8, ED - Abnormal; Notable for the following:    Calcium, Ion 1.24 (*)    All other components within normal limits    Imaging Review No results found.   EKG Interpretation None    will obtain screening labs have TTS evaluation.  Patient will be placed in TCU or PSY  for patients safety Patient has been assessed by TTS he meets criteria for admission after labs have been resulted and reviewed, most likely will be out today [facility] MDM   Final diagnoses:  None    I personally performed the services described in this documentation, which was scribed in my presence. The recorded information has been reviewed and is accurate.     Junius Creamer, NP 12/26/14 [place], MD 12/26/14 0030  Junius Creamer, NP 12/28/14 [place], MD 12/30/14 [phone]

## 2014-12-26 ENCOUNTER — Inpatient Hospital Stay (HOSPITAL_COMMUNITY)
Admission: AD | Admit: 2014-12-26 | Discharge: 2014-12-30 | DRG: 881 | Disposition: A | Discharge status: Home / Self Care | Payer: Medicare HMO | Source: Intra-hospital | Attending: Psychiatry | Admitting: Psychiatry

## 2014-12-26 ENCOUNTER — Encounter (HOSPITAL_COMMUNITY): Payer: Self-pay

## 2014-12-26 ENCOUNTER — Encounter (HOSPITAL_COMMUNITY): Payer: Self-pay | Admitting: Emergency Medicine

## 2014-12-26 DIAGNOSIS — F1021 Alcohol dependence, in remission: Secondary | ICD-10-CM | POA: Diagnosis present

## 2014-12-26 DIAGNOSIS — F17210 Nicotine dependence, cigarettes, uncomplicated: Secondary | ICD-10-CM | POA: Diagnosis present

## 2014-12-26 DIAGNOSIS — B20 Human immunodeficiency virus [HIV] disease: Secondary | ICD-10-CM | POA: Diagnosis present

## 2014-12-26 DIAGNOSIS — Z62810 Personal history of physical and sexual abuse in childhood: Secondary | ICD-10-CM | POA: Diagnosis present

## 2014-12-26 DIAGNOSIS — E039 Hypothyroidism, unspecified: Secondary | ICD-10-CM | POA: Diagnosis present

## 2014-12-26 DIAGNOSIS — F1420 Cocaine dependence, uncomplicated: Secondary | ICD-10-CM | POA: Diagnosis not present

## 2014-12-26 DIAGNOSIS — R45851 Suicidal ideations: Secondary | ICD-10-CM | POA: Diagnosis present

## 2014-12-26 DIAGNOSIS — F29 Unspecified psychosis not due to a substance or known physiological condition: Secondary | ICD-10-CM | POA: Diagnosis present

## 2014-12-26 DIAGNOSIS — Y909 Presence of alcohol in blood, level not specified: Secondary | ICD-10-CM | POA: Diagnosis not present

## 2014-12-26 DIAGNOSIS — F200 Paranoid schizophrenia: Secondary | ICD-10-CM | POA: Diagnosis present

## 2014-12-26 DIAGNOSIS — G4700 Insomnia, unspecified: Secondary | ICD-10-CM | POA: Diagnosis present

## 2014-12-26 DIAGNOSIS — F259 Schizoaffective disorder, unspecified: Secondary | ICD-10-CM | POA: Diagnosis present

## 2014-12-26 DIAGNOSIS — F1410 Cocaine abuse, uncomplicated: Secondary | ICD-10-CM | POA: Diagnosis present

## 2014-12-26 DIAGNOSIS — Y904 Blood alcohol level of 80-99 mg/100 ml: Secondary | ICD-10-CM | POA: Diagnosis present

## 2014-12-26 DIAGNOSIS — F1020 Alcohol dependence, uncomplicated: Secondary | ICD-10-CM | POA: Diagnosis present

## 2014-12-26 DIAGNOSIS — Z8659 Personal history of other mental and behavioral disorders: Secondary | ICD-10-CM

## 2014-12-26 DIAGNOSIS — F329 Major depressive disorder, single episode, unspecified: Principal | ICD-10-CM | POA: Diagnosis present

## 2014-12-26 DIAGNOSIS — I10 Essential (primary) hypertension: Secondary | ICD-10-CM | POA: Diagnosis present

## 2014-12-26 LAB — CBC WITH DIFFERENTIAL/PLATELET
Basophils Absolute: 0 10*3/uL (ref 0.0–0.1)
Basophils Relative: 0 % (ref 0–1)
EOS ABS: 0.1 10*3/uL (ref 0.0–0.7)
Eosinophils Relative: 1 % (ref 0–5)
HEMATOCRIT: 40.8 % (ref 39.0–52.0)
HEMOGLOBIN: 14.1 g/dL (ref 13.0–17.0)
LYMPHS PCT: 24 % (ref 12–46)
Lymphs Abs: 1.9 10*3/uL (ref 0.7–4.0)
MCH: 37.5 pg — AB (ref 26.0–34.0)
MCHC: 34.6 g/dL (ref 30.0–36.0)
MCV: 108.5 fL — AB (ref 78.0–100.0)
Monocytes Absolute: 0.7 10*3/uL (ref 0.1–1.0)
Monocytes Relative: 9 % (ref 3–12)
NEUTROS ABS: 5.2 10*3/uL (ref 1.7–7.7)
NEUTROS PCT: 66 % (ref 43–77)
PLATELETS: 180 10*3/uL (ref 150–400)
RBC: 3.76 MIL/uL — ABNORMAL LOW (ref 4.22–5.81)
RDW: 14.8 % (ref 11.5–15.5)
WBC: 7.8 10*3/uL (ref 4.0–10.5)

## 2014-12-26 LAB — I-STAT CHEM 8, ED
BUN: 17 mg/dL (ref 6–23)
CALCIUM ION: 1.24 mmol/L — AB (ref 1.12–1.23)
Chloride: 105 mmol/L (ref 96–112)
Creatinine, Ser: 1 mg/dL (ref 0.50–1.35)
Glucose, Bld: 72 mg/dL (ref 70–99)
HEMATOCRIT: 45 % (ref 39.0–52.0)
HEMOGLOBIN: 15.3 g/dL (ref 13.0–17.0)
Potassium: 4 mmol/L (ref 3.5–5.1)
SODIUM: 142 mmol/L (ref 135–145)
TCO2: 19 mmol/L (ref 0–100)

## 2014-12-26 LAB — ETHANOL: ALCOHOL ETHYL (B): 84 mg/dL — AB (ref 0–9)

## 2014-12-26 LAB — RAPID URINE DRUG SCREEN, HOSP PERFORMED
AMPHETAMINES: NOT DETECTED
BARBITURATES: NOT DETECTED
BENZODIAZEPINES: NOT DETECTED
Cocaine: POSITIVE — AB
OPIATES: NOT DETECTED
Tetrahydrocannabinol: NOT DETECTED

## 2014-12-26 MED ORDER — GABAPENTIN 300 MG PO CAPS
600.0000 mg | ORAL_CAPSULE | Freq: Every day | ORAL | Status: DC
  Administered 2014-12-26: 600 mg via ORAL
  Filled 2014-12-26: qty 2

## 2014-12-26 MED ORDER — BENZTROPINE MESYLATE 1 MG PO TABS
0.5000 mg | ORAL_TABLET | Freq: Every day | ORAL | Status: DC
  Administered 2014-12-26: 0.5 mg via ORAL
  Filled 2014-12-26: qty 1

## 2014-12-26 MED ORDER — LOPINAVIR-RITONAVIR 200-50 MG PO TABS
2.0000 | ORAL_TABLET | Freq: Two times a day (BID) | ORAL | Status: DC
  Administered 2014-12-26 – 2014-12-27 (×2): 2 via ORAL
  Filled 2014-12-26 (×7): qty 2

## 2014-12-26 MED ORDER — GABAPENTIN 300 MG PO CAPS
600.0000 mg | ORAL_CAPSULE | Freq: Every day | ORAL | Status: DC
  Administered 2014-12-26 – 2014-12-29 (×4): 600 mg via ORAL
  Filled 2014-12-26 (×7): qty 2

## 2014-12-26 MED ORDER — MIRTAZAPINE 30 MG PO TABS
45.0000 mg | ORAL_TABLET | Freq: Every day | ORAL | Status: DC
  Administered 2014-12-26: 45 mg via ORAL
  Filled 2014-12-26: qty 2

## 2014-12-26 MED ORDER — LAMIVUDINE-ZIDOVUDINE 150-300 MG PO TABS
1.0000 | ORAL_TABLET | Freq: Two times a day (BID) | ORAL | Status: DC
  Administered 2014-12-26 – 2014-12-27 (×2): 1 via ORAL
  Filled 2014-12-26 (×7): qty 1

## 2014-12-26 MED ORDER — ADULT MULTIVITAMIN W/MINERALS CH
1.0000 | ORAL_TABLET | Freq: Every day | ORAL | Status: DC
  Administered 2014-12-27: 1 via ORAL
  Filled 2014-12-26 (×3): qty 1

## 2014-12-26 MED ORDER — BENZTROPINE MESYLATE 0.5 MG PO TABS
0.5000 mg | ORAL_TABLET | Freq: Every day | ORAL | Status: DC
  Administered 2014-12-26: 0.5 mg via ORAL
  Filled 2014-12-26 (×4): qty 1

## 2014-12-26 MED ORDER — LAMIVUDINE-ZIDOVUDINE 150-300 MG PO TABS
1.0000 | ORAL_TABLET | Freq: Two times a day (BID) | ORAL | Status: DC
  Administered 2014-12-26 (×2): 1 via ORAL
  Filled 2014-12-26 (×4): qty 1

## 2014-12-26 MED ORDER — ALUM & MAG HYDROXIDE-SIMETH 200-200-20 MG/5ML PO SUSP: 30.0000 mL | ORAL | Status: DC | PRN

## 2014-12-26 MED ORDER — HYDROCHLOROTHIAZIDE 12.5 MG PO CAPS
12.5000 mg | ORAL_CAPSULE | Freq: Every day | ORAL | Status: DC
  Administered 2014-12-27 – 2014-12-30 (×4): 12.5 mg via ORAL
  Filled 2014-12-26 (×5): qty 1

## 2014-12-26 MED ORDER — MAGNESIUM HYDROXIDE 400 MG/5ML PO SUSP: 30.0000 mL | Freq: Every day | ORAL | Status: DC | PRN

## 2014-12-26 MED ORDER — MIRTAZAPINE 45 MG PO TABS
45.0000 mg | ORAL_TABLET | Freq: Every day | ORAL | Status: DC
  Administered 2014-12-26: 45 mg via ORAL
  Filled 2014-12-26 (×2): qty 1
  Filled 2014-12-26: qty 3
  Filled 2014-12-26: qty 1

## 2014-12-26 MED ORDER — ADULT MULTIVITAMIN W/MINERALS CH
1.0000 | ORAL_TABLET | Freq: Every day | ORAL | Status: DC
  Administered 2014-12-26: 1 via ORAL
  Filled 2014-12-26: qty 1

## 2014-12-26 MED ORDER — NICOTINE 21 MG/24HR TD PT24
21.0000 mg | MEDICATED_PATCH | Freq: Every day | TRANSDERMAL | Status: DC
  Administered 2014-12-26 – 2014-12-30 (×4): 21 mg via TRANSDERMAL
  Filled 2014-12-26 (×5): qty 1

## 2014-12-26 MED ORDER — LOPINAVIR-RITONAVIR 200-50 MG PO TABS
2.0000 | ORAL_TABLET | Freq: Two times a day (BID) | ORAL | Status: DC
  Administered 2014-12-26 (×2): 2 via ORAL
  Filled 2014-12-26 (×3): qty 2

## 2014-12-26 MED ORDER — ACETAMINOPHEN 325 MG PO TABS
650.0000 mg | ORAL_TABLET | Freq: Four times a day (QID) | ORAL | Status: DC | PRN
  Administered 2014-12-28 – 2014-12-29 (×2): 650 mg via ORAL
  Filled 2014-12-26 (×2): qty 2

## 2014-12-26 MED ORDER — RISPERIDONE 2 MG PO TABS
4.0000 mg | ORAL_TABLET | Freq: Every day | ORAL | Status: DC
  Administered 2014-12-26: 4 mg via ORAL
  Filled 2014-12-26 (×4): qty 2

## 2014-12-26 MED ORDER — RISPERIDONE 2 MG PO TABS
4.0000 mg | ORAL_TABLET | Freq: Every day | ORAL | Status: DC
Start: 2014-12-26 — End: 2014-12-26
  Administered 2014-12-26: 4 mg via ORAL
  Filled 2014-12-26: qty 2

## 2014-12-26 MED ORDER — HYDROCHLOROTHIAZIDE 12.5 MG PO CAPS
12.5000 mg | ORAL_CAPSULE | Freq: Every day | ORAL | Status: DC
  Administered 2014-12-26: 12.5 mg via ORAL
  Filled 2014-12-26 (×2): qty 1

## 2014-12-26 NOTE — ED Notes (Signed)
Patient resting quietly in bed with eyes closed, Respirations equal and unlabored, skin warm and dry, NAD. No change in assessment or acuity. Q 15 minute safety checks remain in place.   

## 2014-12-26 NOTE — Progress Notes (Signed)
Pt d/c to [facility]1 as per order. Pt A & O X 3. Cooperative with d/c procedure. Vitals done and WNL. No physical distress to note at time of departure, pt's gait steady. Safety maintained on Q 15 minutes checks without gestures of self harm to note thus far this shift. All belongings given to pt from locker 42 at time of d/c. Report given to Butch Penny, RN at [facility]. Pt picked up by [facility] transportation services.

## 2014-12-26 NOTE — Progress Notes (Signed)
Patient did attend the evening speaker AA meeting.  

## 2014-12-26 NOTE — Consult Note (Signed)
[place] [facility] Consult   Reason for Consult: Auditory [name] halucination Referring Physician:  EDP Patient Identification: [name] Herring MRN:  [id number] Principal Diagnosis: Psychotic disorder Diagnosis:   Patient Active Problem List   Diagnosis Date Noted  . Psychotic disorder [F29] 07/01/2014    Priority: High  . Cocaine abuse [F14.10] 09/26/2014  . Alcohol abuse [F10.10] 09/26/2014  . Alcohol intoxication [F10.129]   . Cocaine use disorder, moderate, dependence [F14.20]   . ERECTILE DYSFUNCTION [F52.8] 09/03/2007  . FOOT PAIN, BILATERAL [M79.609] 07/10/2007  . HIV DISEASE [B20] 11/09/2006  . VIRAL MENINGITIS [A87.9] 11/09/2006  . THRUSH [B37.0] 11/09/2006  . 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 HYPOTHYROIDISM [E03.9] 11/09/2006  . Paranoid schizophrenia [F20.0] 11/09/2006  . DISORDER, BIPOLAR NOS [F31.9] 11/09/2006  . ANXIETY [F41.1] 11/09/2006  . TOBACCO ABUSE [Z72.0] 11/09/2006  . SUBSTANCE ABUSE, MULTIPLE [IMO0002] 11/09/2006  . DEPRESSION [F32.9] 11/09/2006  . PYELONEPHRITIS [N12] 11/09/2006    Total Time spent with patient: 1 hour  Subjective:   [name] Herring is a 54 y.o. male patient admitted with Psychosis.  HPI:  AA male, 54 years old was evaluated for auditory/visual hallucination.  Patient has a known hx of mental illness and substance use.  He sees a Teacher, music at [employer] at [place] for his mental care.  Patient reports hearing voices telling him to kill himself.  Patient also reports seeing objects without faces.  He reports a hx of suicide attempt by cutting his wrist and OD on his pills about 4 years ago.  He denies SI/HI but reports feeling angry and agitated because of the voices telling him to hurt himself.  He reports poor sleep because the voices prevents him from sleeping or waking him up.  Patient reports feeling "little depressed"  We have accepted patient for admission and will be seeking placement at any facility with available  inpatient Psychiatric beds.  We will resume his home medications.  HPI Elements:   Location:  Psychosis, Schizoaffective disorder by hx, Cocaine use disorder, moderate. Quality:  severe, . Severity:  severe. Timing:  acute. Duration:  Chronic mental illness. Context:  seeking treatment for auditory/visual hallucination..  Past Medical History:  Past Medical History  Diagnosis Date  . Hypertension   . HIV (human immunodeficiency virus infection)   . Schizophrenia   . Depression    History reviewed. No pertinent past surgical history. Family History: History reviewed. No pertinent family history. Social History:  History  Alcohol Use  . Yes    Comment: Daily      History  Drug Use  . Yes  . Special: Cocaine    History   Social History  . Marital Status: Divorced    Spouse Name: N/A  . Number of Children: N/A  . Years of Education: N/A   Social History Main Topics  . Smoking status: Current Every Day Smoker -- 1.00 packs/day    Types: Cigarettes  . Smokeless tobacco: Not on file  . Alcohol Use: Yes     Comment: Daily   . Drug Use: Yes    Special: Cocaine  . Sexual Activity: No   Other Topics Concern  . None   Social History Narrative   Additional Social History:    History of alcohol / drug use?: Yes Longest period of sobriety (when/how long): "8 years" Name of Substance 1: Alcohol  1 - Age of First Use: 23 1 - Amount (size/oz): "2 pints" 1 - Frequency: Daily  1 -  Duration: 1 day  1 - Last Use / Amount: 12-25-14 Name of Substance 2: Crack  2 - Age of First Use: "late 74's" 2 - Amount (size/oz): "$200" 2 - Frequency: Daily  2 - Duration: 1 day  2 - Last Use / Amount: 12-25-14                 Allergies:   Allergies  Allergen Reactions  . Amoxicillin Rash    Labs:  Results for orders placed or performed during the hospital encounter of 12/25/14 (from the past 48 hour(s))  CBC with Differential     Status: Abnormal   Collection Time: 12/26/14  12:21 AM  Result Value Ref Range   WBC 7.8 4.0 - 10.5 K/uL   RBC 3.76 (L) 4.22 - 5.81 MIL/uL   Hemoglobin 14.1 13.0 - 17.0 g/dL   HCT 40.8 39.0 - 52.0 %   MCV 108.5 (H) 78.0 - 100.0 fL   MCH 37.5 (H) 26.0 - 34.0 pg   MCHC 34.6 30.0 - 36.0 g/dL   RDW 14.8 11.5 - 15.5 %   Platelets 180 150 - 400 K/uL   Neutrophils Relative % 66 43 - 77 %   Neutro Abs 5.2 1.7 - 7.7 K/uL   Lymphocytes Relative 24 12 - 46 %   Lymphs Abs 1.9 0.7 - 4.0 K/uL   Monocytes Relative 9 3 - 12 %   Monocytes Absolute 0.7 0.1 - 1.0 K/uL   Eosinophils Relative 1 0 - 5 %   Eosinophils Absolute 0.1 0.0 - 0.7 K/uL   Basophils Relative 0 0 - 1 %   Basophils Absolute 0.0 0.0 - 0.1 K/uL  Ethanol     Status: Abnormal   Collection Time: 12/26/14 12:21 AM  Result Value Ref Range   Alcohol, Ethyl (B) 84 (H) 0 - 9 mg/dL    Comment:        LOWEST DETECTABLE LIMIT FOR SERUM ALCOHOL IS 11 mg/dL FOR MEDICAL PURPOSES ONLY   I-stat chem 8, ed     Status: Abnormal   Collection Time: 12/26/14 12:27 AM  Result Value Ref Range   Sodium 142 135 - 145 mmol/L   Potassium 4.0 3.5 - 5.1 mmol/L   Chloride 105 96 - 112 mmol/L   BUN 17 6 - 23 mg/dL   Creatinine, Ser 1.00 0.50 - 1.35 mg/dL   Glucose, Bld 72 70 - 99 mg/dL   Calcium, Ion 1.24 (H) 1.12 - 1.23 mmol/L   TCO2 19 0 - 100 mmol/L   Hemoglobin 15.3 13.0 - 17.0 g/dL   HCT 45.0 39.0 - 52.0 %  Urine rapid drug screen (hosp performed)     Status: Abnormal   Collection Time: 12/26/14  8:37 AM  Result Value Ref Range   Opiates NONE DETECTED NONE DETECTED   Cocaine POSITIVE (A) NONE DETECTED   Benzodiazepines NONE DETECTED NONE DETECTED   Amphetamines NONE DETECTED NONE DETECTED   Tetrahydrocannabinol NONE DETECTED NONE DETECTED   Barbiturates NONE DETECTED NONE DETECTED    Comment:        DRUG SCREEN FOR MEDICAL PURPOSES ONLY.  IF CONFIRMATION IS NEEDED FOR ANY PURPOSE, NOTIFY LAB WITHIN 5 DAYS.        LOWEST DETECTABLE LIMITS FOR URINE DRUG SCREEN Drug Class        Cutoff (ng/mL) Amphetamine      1000 Barbiturate      200 Benzodiazepine   782 Tricyclics       956 Opiates  300 Cocaine          300 THC              50     Vitals: Blood pressure 109/61, pulse 79, temperature 98.5 F (36.9 C), temperature source Oral, resp. rate 16, SpO2 100 %.  Risk to Self: Suicidal Ideation: Yes-Currently Present Suicidal Intent: Yes-Currently Present Is patient at risk for suicide?: Yes Suicidal Plan?: Yes-Currently Present Specify Current Suicidal Plan: "take pills" Access to Means: Yes Specify Access to Suicidal Means: Pt reported that he has prescriptions.  What has been your use of drugs/alcohol within the last 12 months?: Pt reported that he relapse today.  How many times?: 3 Other Self Harm Risks: No other self harm risk identified at this time.  Triggers for Past Attempts: Unpredictable Intentional Self Injurious Behavior: None Risk to Others: Homicidal Ideation: No Thoughts of Harm to Others: No Current Homicidal Intent: No Current Homicidal Plan: No Access to Homicidal Means: No Identified Victim: NA History of harm to others?: No Assessment of Violence: None Noted Violent Behavior Description: No violent behaviors observed at this time. Pt is calm and cooperative.  Does patient have access to weapons?: No Criminal Charges Pending?: No Does patient have a court date: No Prior Inpatient Therapy: Prior Inpatient Therapy: Yes Prior Therapy Dates: 2009, 2015 Prior Therapy Facilty/Provider(s): [facility] Reason for Treatment: Schizophrenia  Prior Outpatient Therapy: Prior Outpatient Therapy: Yes Prior Therapy Dates: Current  Prior Therapy Facilty/Provider(s): [facility]  Reason for Treatment: Schizophrenia   Current Facility-Administered Medications  Medication Dose Route Frequency Provider Last Rate Last Dose  . benztropine (COGENTIN) tablet 0.5 mg  0.5 mg Oral Q2000 Junius Creamer, NP   0.5 mg at 12/26/14 0045  . gabapentin (NEURONTIN) capsule  600 mg  600 mg Oral Q2000 Junius Creamer, NP   600 mg at 12/26/14 0044  . hydrochlorothiazide (MICROZIDE) capsule 12.5 mg  12.5 mg Oral Daily Junius Creamer, NP   12.5 mg at 12/26/14 1026  . lamiVUDine-zidovudine (COMBIVIR) 150-300 MG per tablet 1 tablet  1 tablet Oral BID Junius Creamer, NP   1 tablet at 12/26/14 1026  . lopinavir-ritonavir (KALETRA) 200-50 MG per tablet 2 tablet  2 tablet Oral BID Junius Creamer, NP   2 tablet at 12/26/14 1026  . mirtazapine (REMERON) tablet 45 mg  45 mg Oral Q2000 Junius Creamer, NP   45 mg at 12/26/14 0044  . multivitamin with minerals tablet 1 tablet  1 tablet Oral Daily Junius Creamer, NP   1 tablet at 12/26/14 1026  . risperiDONE (RISPERDAL) tablet 4 mg  4 mg Oral QHS Junius Creamer, NP   4 mg at 12/26/14 0044   Current Outpatient Prescriptions  Medication Sig Dispense Refill  . benztropine (COGENTIN) 0.5 MG tablet Take 1 tablet (0.5 mg total) by mouth daily at 8 pm. 30 tablet 0  . gabapentin (NEURONTIN) 300 MG capsule Take 2 capsules (600 mg total) by mouth daily at 8 pm. 60 capsule 0  . hydrochlorothiazide (HYDRODIURIL) 25 MG tablet Take 0.5 tablets (12.5 mg total) by mouth daily. 30 tablet 0  . lamiVUDine-zidovudine (COMBIVIR) 150-300 MG per tablet Take 1 tablet by mouth 2 (two) times daily. 30 tablet 0  . lamiVUDine-zidovudine (COMBIVIR) 150-300 MG per tablet Take 1 tablet by mouth 2 (two) times daily.    Marland Kitchen lopinavir-ritonavir (KALETRA) 200-50 MG per tablet Take 2 tablets by mouth 2 (two) times daily. 60 tablet 0  . lopinavir-ritonavir (KALETRA) 200-50 MG per tablet Take 2 tablets  by mouth 2 (two) times daily.    . mirtazapine (REMERON) 45 MG tablet Take 1 tablet (45 mg total) by mouth daily at 8 pm. 30 tablet 0  . Multiple Vitamin (MULTIVITAMIN WITH MINERALS) TABS tablet Take 1 tablet by mouth daily.    . risperidone (RISPERDAL) 4 MG tablet Take 1 tablet (4 mg total) by mouth daily at 8 pm. 30 tablet 0    Musculoskeletal: Strength & Muscle Tone: seen in bed during this  assessment Gait & Station: assessed while in bed Patient leans: see above  Psychiatric Specialty Exam:     Blood pressure 109/61, pulse 79, temperature 98.5 F (36.9 C), temperature source Oral, resp. rate 16, SpO2 100 %.There is no weight on file to calculate BMI.  General Appearance: Casual and Disheveled  Eye Contact::  Minimal  Speech:  Clear and Coherent and Normal Rate  Volume:  Normal  Mood:  Depressed  Affect:  Congruent, Depressed and Flat  Thought Process:  Coherent, Goal Directed and Intact  Orientation:  Full (Time, Place, and Person)  Thought Content:  Hallucinations: Auditory Command:  voices telling him to kill himself Visual  Suicidal Thoughts:  No  Homicidal Thoughts:  No  Memory:  Immediate;   Good Recent;   Good Remote;   Good  Judgement:  Good  Insight:  Good  Psychomotor Activity:  Normal  Concentration:  Good  Recall:  NA  Fund of Knowledge:Good  Language: Good  Akathisia:  NA  Handed:  Right  AIMS (if indicated):     Assets:  Desire for Improvement  ADL's:  Impaired  Cognition: WNL  Sleep:      Medical Decision Making: Established Problem, Worsening (2), Review of Medication Regimen & Side Effects (2) and Review of New Medication or Change in Dosage (2)  Treatment Plan Summary: Daily contact with patient to assess and evaluate symptoms and progress in treatment, Medication management and Plan ACCEPTED FOR ADMISSION, SEEKING PLACEMENT AT ANY FACILITY WITH INPATIENT PSYCHIATRIC BED  Plan:  Recommend psychiatric Inpatient admission when medically cleared. Disposition: SEE ABOVE  Delfin Gant 12/26/2014 1:49 PM Patient seen face-to-face for psychiatric evaluation, chart reviewed and case discussed with the physician extender and developed treatment plan. Reviewed the information documented and agree with the treatment plan. Corena Pilgrim, MD

## 2014-12-26 NOTE — Progress Notes (Signed)
D.  Pt just admitted today to the services of Dr. Shea Evans.  Pt appears guarded upon approach, took ordered medication with very little water.  Appears somewhat paranoid in action.  Pt does endorse auditory hallucinations, visual hallucinations at times.  Denies SI/HI at this time.  Positive for evening AA group, minimal interaction on unit this evening.  A.  Support and encouragement offered, medication given as ordered.  R.  Pt remains safe on unit, will continue to monitor.

## 2014-12-26 NOTE — Progress Notes (Addendum)
D) This is a 54 year old male who is admitted voluntarily to the service of Dr. Shea Evans. Pt states he has been hearing voices for a very long time "three decades" and recently they have really gotten to him. Pt states "I hear voices to kill myself and to use crack and drink alcohol. I have been clean for years and I used $200.00 worth of cocaine and drank 11/2 pints of alcohol yesterday". Pt was upset with himself that he had used. Pt reports that "there are a lot of voices screaming at one time". Pt was cooperative throughout the interview. Glad to be in the hospital "where I can get help". A) Given support, reassurance and praise. Encouragement provided along with active listening. Orders received from midlevel. Verbal agreement received from Pt for his safety on the unit. Provided with a 1:1. R) Pt adjusting to the unit and states that he will remain safe. ADDENDUM Pt has HIV and has been compliant with his medications.
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
## 2014-12-26 NOTE — ED Notes (Signed)
Patient ambulatory to treatment area 42 vi ambulatory escorted by NT. Patient reports that he hears voices telling him to kill himself and sees objects with no faces. Patient also admits to SI with a plan to over dose but denies HI. Patient oriented to unit and plan of care discuss. Patient given sandwich and water. Patient voices no concerns or complaints at this time. Encouragement and support provided and safety maintain. Q 15 min safety checks in place.

## 2014-12-26 NOTE — BH Assessment (Signed)
Assessment completed. Consulted [name] [place], NP who agrees that pt meets inpatient criteria. [name] Herring has been informed of the recommendation. TTS will seek placement once pt is medically cleared.

## 2014-12-26 NOTE — ED Notes (Signed)
Urine sample requested from patient several times. Patient unable to provided sample even though he's been to restroom several times.

## 2014-12-26 NOTE — ED Notes (Signed)
Pt reports he is hearing voices telling him to kill himself. Pt states last SI attempt was a couple of years ago and states he is SI today.

## 2014-12-26 NOTE — BH Assessment (Signed)
[place] Assessment Progress Note   Gerald Stabs from [facility] called and stated beds available at 1215.  This referral faxed for review.  Shaune Pascal, MS, [facility] Therapeutic Triage Specialist [facility]

## 2014-12-27 ENCOUNTER — Encounter (HOSPITAL_COMMUNITY): Payer: Self-pay | Admitting: Registered Nurse

## 2014-12-27 DIAGNOSIS — F29 Unspecified psychosis not due to a substance or known physiological condition: Secondary | ICD-10-CM

## 2014-12-27 DIAGNOSIS — R45851 Suicidal ideations: Secondary | ICD-10-CM

## 2014-12-27 DIAGNOSIS — B20 Human immunodeficiency virus [HIV] disease: Secondary | ICD-10-CM

## 2014-12-27 DIAGNOSIS — Z8659 Personal history of other mental and behavioral disorders: Secondary | ICD-10-CM

## 2014-12-27 DIAGNOSIS — F1021 Alcohol dependence, in remission: Secondary | ICD-10-CM | POA: Diagnosis present

## 2014-12-27 DIAGNOSIS — F1020 Alcohol dependence, uncomplicated: Secondary | ICD-10-CM

## 2014-12-27 DIAGNOSIS — F1420 Cocaine dependence, uncomplicated: Secondary | ICD-10-CM

## 2014-12-27 MED ORDER — HYDROCHLOROTHIAZIDE 25 MG PO TABS
12.5000 mg | ORAL_TABLET | Freq: Every day | ORAL | Status: DC
  Filled 2014-12-27: qty 1
  Filled 2014-12-27 (×3): qty 0.5

## 2014-12-27 MED ORDER — CHLORDIAZEPOXIDE HCL 25 MG PO CAPS
25.0000 mg | ORAL_CAPSULE | Freq: Three times a day (TID) | ORAL | Status: AC
  Administered 2014-12-28 – 2014-12-29 (×3): 25 mg via ORAL
  Filled 2014-12-27 (×3): qty 1

## 2014-12-27 MED ORDER — GABAPENTIN 300 MG PO CAPS
600.0000 mg | ORAL_CAPSULE | Freq: Every day | ORAL | Status: DC
  Filled 2014-12-27 (×3): qty 2

## 2014-12-27 MED ORDER — HYDROXYZINE HCL 25 MG PO TABS
25.0000 mg | ORAL_TABLET | Freq: Four times a day (QID) | ORAL | Status: DC | PRN
  Administered 2014-12-27: 25 mg via ORAL
  Filled 2014-12-27: qty 1

## 2014-12-27 MED ORDER — LOPINAVIR-RITONAVIR 200-50 MG PO TABS
2.0000 | ORAL_TABLET | Freq: Two times a day (BID) | ORAL | Status: DC
  Administered 2014-12-27 – 2014-12-30 (×6): 2 via ORAL
  Filled 2014-12-27 (×8): qty 2

## 2014-12-27 MED ORDER — CHLORDIAZEPOXIDE HCL 25 MG PO CAPS: 25.0000 mg | ORAL_CAPSULE | Freq: Four times a day (QID) | ORAL | Status: DC | PRN

## 2014-12-27 MED ORDER — MIRTAZAPINE 15 MG PO TABS
45.0000 mg | ORAL_TABLET | Freq: Every day | ORAL | Status: DC
  Administered 2014-12-27 – 2014-12-29 (×3): 45 mg via ORAL
  Filled 2014-12-27 (×4): qty 1

## 2014-12-27 MED ORDER — BENZTROPINE MESYLATE 0.5 MG PO TABS
0.5000 mg | ORAL_TABLET | Freq: Every day | ORAL | Status: DC
  Administered 2014-12-27 – 2014-12-29 (×3): 0.5 mg via ORAL
  Filled 2014-12-27 (×4): qty 1

## 2014-12-27 MED ORDER — RISPERIDONE 2 MG PO TABS
4.0000 mg | ORAL_TABLET | Freq: Every day | ORAL | Status: DC
  Administered 2014-12-27 – 2014-12-29 (×3): 4 mg via ORAL
  Filled 2014-12-27 (×4): qty 2

## 2014-12-27 MED ORDER — CHLORDIAZEPOXIDE HCL 25 MG PO CAPS: 25.0000 mg | ORAL_CAPSULE | Freq: Every day | ORAL | Status: DC

## 2014-12-27 MED ORDER — ONDANSETRON 4 MG PO TBDP: 4.0000 mg | ORAL_TABLET | Freq: Four times a day (QID) | ORAL | Status: DC | PRN

## 2014-12-27 MED ORDER — CHLORDIAZEPOXIDE HCL 25 MG PO CAPS
25.0000 mg | ORAL_CAPSULE | ORAL | Status: DC
  Administered 2014-12-30: 25 mg via ORAL
  Filled 2014-12-27 (×2): qty 1

## 2014-12-27 MED ORDER — CHLORDIAZEPOXIDE HCL 25 MG PO CAPS
25.0000 mg | ORAL_CAPSULE | Freq: Four times a day (QID) | ORAL | Status: AC
  Administered 2014-12-27 – 2014-12-28 (×3): 25 mg via ORAL
  Filled 2014-12-27 (×3): qty 1

## 2014-12-27 MED ORDER — VITAMIN B-1 100 MG PO TABS
100.0000 mg | ORAL_TABLET | Freq: Every day | ORAL | Status: DC
  Administered 2014-12-28 – 2014-12-30 (×3): 100 mg via ORAL
  Filled 2014-12-27 (×5): qty 1

## 2014-12-27 MED ORDER — LAMIVUDINE-ZIDOVUDINE 150-300 MG PO TABS
1.0000 | ORAL_TABLET | Freq: Two times a day (BID) | ORAL | Status: DC
  Administered 2014-12-27 – 2014-12-30 (×6): 1 via ORAL
  Filled 2014-12-27 (×8): qty 1

## 2014-12-27 MED ORDER — LOPERAMIDE HCL 2 MG PO CAPS: 2.0000 mg | ORAL_CAPSULE | ORAL | Status: DC | PRN

## 2014-12-27 MED ORDER — ADULT MULTIVITAMIN W/MINERALS CH
1.0000 | ORAL_TABLET | Freq: Every day | ORAL | Status: DC
  Administered 2014-12-27 – 2014-12-30 (×4): 1 via ORAL
  Filled 2014-12-27 (×5): qty 1

## 2014-12-27 MED ORDER — THIAMINE HCL 100 MG/ML IJ SOLN: 100.0000 mg | Freq: Once | INTRAMUSCULAR | Status: AC

## 2014-12-27 NOTE — Progress Notes (Signed)
D) Pt has attended the groups and interacts with his peers. Affect and mood are appropriate. Pt rates his depression at an 8, his hopelessness at an 8 and his anxiety at a 9. Admits to thoughts of SI. Denies HI. States that he continues to hear voices "I will hear them for the rest of my life".  A) Given support, reassurance and praise. Encouragement provided. R) Verbal contract for his safety.

## 2014-12-27 NOTE — [facility] Suicide Risk Assessment (Signed)
[facility] Admission Suicide Risk Assessment   Nursing information obtained from:  Patient Demographic factors:  Male, Low socioeconomic status Current Mental Status:  Suicidal ideation indicated by patient, Suicide plan, Self-harm behaviors Loss Factors:  Decline in physical health Historical Factors:  Prior suicide attempts, Family history of mental illness or substance abuse Risk Reduction Factors:  Religious beliefs about death, Living with another person, especially a relative Total Time spent with patient: 20 minutes Principal Problem: Psychosis Diagnosis:   Patient Active Problem List   Diagnosis Date Noted  . Cocaine use disorder, severe, dependence [F14.20] 12/27/2014  . Alcohol use disorder, severe, dependence [F10.20] 12/27/2014  . Hx of schizophrenia [Z86.59] 12/27/2014  . Psychosis [F29] 12/27/2014  . ERECTILE DYSFUNCTION [F52.8] 09/03/2007  . FOOT PAIN, BILATERAL [M79.609] 07/10/2007  . Human immunodeficiency virus (HIV) disease [B20] 11/09/2006  . VIRAL MENINGITIS [A87.9] 11/09/2006  . THRUSH [B37.0] 11/09/2006  . 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 HYPOTHYROIDISM [E03.9] 11/09/2006  . DISORDER, BIPOLAR NOS [F31.9] 11/09/2006  . PYELONEPHRITIS [N12] 11/09/2006     Continued Clinical Symptoms:  Alcohol Use Disorder Identification Test Final Score (AUDIT): 1 The "Alcohol Use Disorders Identification Test", Guidelines for Use in Primary Care, Second Edition.  World Pharmacologist [facility]). Score between 0-7:  no or low risk or alcohol related problems. Score between 8-15:  moderate risk of alcohol related problems. Score between 16-19:  high risk of alcohol related problems. Score 20 or above:  warrants further diagnostic evaluation for alcohol dependence and treatment.   CLINICAL FACTORS:   Alcohol/Substance Abuse/Dependencies More than one psychiatric diagnosis Unstable or Poor Therapeutic Relationship Previous Psychiatric Diagnoses and Treatments Medical  Diagnoses and Treatments/Surgeries   Musculoskeletal: Strength & Muscle Tone: within normal limits Gait & Station: normal Patient leans: N/A  Psychiatric Specialty Exam: Physical Exam  Review of Systems  Constitutional: Negative.   HENT: Negative.   Eyes: Negative.   Respiratory: Negative.   Cardiovascular: Negative.   Genitourinary: Negative.   Musculoskeletal: Negative.   Skin: Negative.   Neurological: Negative.   Psychiatric/Behavioral: Positive for depression, suicidal ideas, hallucinations and substance abuse. The patient is nervous/anxious.     Blood pressure 152/97, pulse 64, temperature 98.1 F (36.7 C), temperature source Oral, resp. rate 16, height 5\' 7"  (1.702 m), weight 64.411 kg (142 lb), SpO2 99 %.Body mass index is 22.24 kg/(m^2).  General Appearance: Casual  Eye Contact::  Fair  Speech:  Clear and Coherent  Volume:  Normal  Mood:  Anxious  Affect:  Flat  Thought Process:  Coherent  Orientation:  Full (Time, Place, and Person)  Thought Content:  Hallucinations: Auditory  Suicidal Thoughts:  Yes.  without intent/plan  Homicidal Thoughts:  No  Memory:  Immediate;   Fair Recent;   Fair Remote;   Fair  Judgement:  Impaired  Insight:  Lacking  Psychomotor Activity:  Normal  Concentration:  Fair  Recall:  [employer] of Knowledge:Fair  Language: Fair  Akathisia:  No  Handed:  Right  AIMS (if indicated):     Assets:  Communication Skills Desire for Improvement  Sleep:  Number of Hours: 6.5  Cognition: WNL  ADL's:  Intact     COGNITIVE FEATURES THAT CONTRIBUTE TO RISK:  Closed-mindedness, Polarized thinking and Thought constriction (tunnel vision)    SUICIDE RISK:   Moderate:  Frequent suicidal ideation with limited intensity, and duration, some specificity in terms of plans, no associated intent, good self-control, limited dysphoria/symptomatology, some risk factors present, and identifiable protective  factors, including available and accessible  social support.  PLAN OF CARE:Patient will benefit from inpatient treatment and stabilization.  Estimated length of stay is 5-7 days.  Reviewed past medical records,treatment plan.  Will continue to monitor vitals ,medication compliance and treatment side effects while patient is here.  Will monitor for medical issues as well as call consult as needed.  Reviewed labs ,will order as needed.  CSW will start working on disposition.  Patient to participate in therapeutic milieu .       Medical Decision Making:  Review of Psycho-Social Stressors (1), Review or order clinical lab tests (1), Review and summation of old records (2), Review of Last Therapy Session (1), Review of Medication Regimen & Side Effects (2) and Review of New Medication or Change in Dosage (2)  I certify that inpatient services furnished can reasonably be expected to improve the patient's condition.   Davyn Morandi md 12/27/2014, 2:01 PM
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
## 2014-12-27 NOTE — Progress Notes (Signed)
Psychoeducational Group Note  Date:  12/27/2014 Time:  1015  Group Topic/Focus:  Making Healthy Choices:   The focus of this group is to help patients identify negative/unhealthy choices they were using prior to admission and identify positive/healthier coping strategies to replace them upon discharge.  Participation Level:  Active  Participation Quality:  Appropriate  Affect:  Appropriate  Cognitive:  Oriented  Insight:  Improving  Engagement in Group:  Engaged  Additional Comments:  Attentive and participating in the group  Bryson Dames A 12/27/2014

## 2014-12-27 NOTE — [facility] Group Notes (Signed)
[place] Group Notes:  (Clinical Social Work)  12/27/2014  1:15-2:00PM  Summary of Progress/Problems:   The main focus of today's process group was to   1)  discuss the importance of adding supports  2)  define healthy supports versus unhealthy supports  3)  identify the patient's current healthy supports and brainstorm what to add  4)  discuss self-support and how difficult it is to support oneself, helping patients to understand the difficulties family members face with this issue.  Motivational Interviewing was used, particularly rolling with resistance as most of the group was quite resistant to change.  The patient stated that he feels it is under his control whether he chooses to reach out for supports.  He contributed greatly to the discussion.  Type of Therapy:  Process Group with Motivational Interviewing  Participation Level:  Active  Participation Quality:  Attentive and Sharing  Affect:  Blunted and Depressed  Cognitive:  Alert and Appropriate  Insight:  Engaged  Engagement in Therapy:  Engaged  Modes of Intervention:   Motivational Interviewing, [employer], LCSW 12/27/2014, 3:15pm

## 2014-12-27 NOTE — [facility] Group Notes (Signed)
[place] Group Notes:  Healthy support system  Date:  12/27/2014  Time:  10:13 AM  Type of Therapy:  Nurse Education  Participation Level:  Active  Participation Quality:  Appropriate  Affect:  Appropriate  Cognitive:  Appropriate  Insight:  Appropriate  Engagement in Group:  Engaged  Modes of Intervention:  Discussion  Summary of Progress/Problems:Pt stated his three pillars are: God, his grandchildren and his faith  Delman Kitten 12/27/2014, 10:13 AM

## 2014-12-27 NOTE — Progress Notes (Signed)
Patient did attend the evening speaker AA meeting.  

## 2014-12-28 DIAGNOSIS — Y909 Presence of alcohol in blood, level not specified: Secondary | ICD-10-CM

## 2014-12-28 DIAGNOSIS — F329 Major depressive disorder, single episode, unspecified: Principal | ICD-10-CM

## 2014-12-28 NOTE — Clinical Social Work Note (Signed)
At patient's request, referral faxed to [facility] for review.   Tilden Fossa, MSW, [place] Worker [facility] [phone]

## 2014-12-28 NOTE — BHH Group Notes (Signed)
[facility] LCSW Aftercare Discharge Planning Group Note  12/28/2014  8:45 AM  Participation Quality: Did Not Attend. Patient invited to participate but declined.  Tilden Fossa, MSW, [place] Worker [facility] [phone]

## 2014-12-28 NOTE — Progress Notes (Signed)
Pt has been observed in the dayroom most of the evening watching TV and talking with peers.  He attended evening AA group and seemed engaged with the Probation officer.  He reports he still hears voices, but can contract for safety.  He denies HI.  Pt states he has been going to groups today.  He still reports his depression and hopelessness as high.  He is hopeful to find a medication combo that will decrease the voices.  He wants to stay clean and sober with he is discharged.  He is unsure what his discharge plans are at this time.  He makes his needs known to staff.  Support and encouragement offered.  Safety maintained with q15 minute checks.

## 2014-12-28 NOTE — BHH Counselor (Signed)
Adult Comprehensive Assessment  Patient ID: [name] Herring, male DOB: 1961-08-22, 54 y.o. MRN: [id number]  Information Source: Information source: Patient  Current Stressors:  Education: N/A Employment: N/A Family Relationships: Patient reports a strained relationship with his sister due to her disapproval of his daughter being homosexual Financial: N/A Housing: Reports that it is stressful living with a married couple who are in recovery Bereavement / Loss: no recent losses.  Medical issues: Hypertension and HIV  Living/Environment/Situation:  Living Arrangements: Friends/non-relatives Living conditions (as described by patient or guardian): living with a married couple who are in recovery How long has patient lived in current situation?: unknown What is atmosphere in current home: Temporary  Family History:  Marital status: Divorced Divorced, when?: 2 years ago What types of issues is patient dealing with in the relationship?: we had some communication problems. Now we get along better than before. We were married for 29 years and have four kids together. She is by far, my biggest support but I don't want her to know that I am here.  Additional relationship information: n/a  Does patient have children?: Yes How many children?: 4 How is patient's relationship with their children?: three boys and a girl. youngest is at [employer]. I have a great relationship with my kids. They live in [place] but not closeby.   Childhood History:  By whom was/is the patient raised?: Both parents;Mother Additional childhood history information: my mom mostly raised me. my parents were married for most of my childhood but dad disappeared alot. He was an alcoholic but I didn't really understand that. He was always kind and funny but my mom couldn't handle it and eventually divorced him. Description of patient's relationship with caregiver when they were a child: close to mother. close to father when  he was around Patient's description of current relationship with people who raised him/her: close to parents until their death (both died in 71).  Does patient have siblings?: Yes Number of Siblings: 2 Description of patient's current relationship with siblings: older brother and younger brother-both died in 65. "I had a mental breakdown after that. I lost my whole family in just two years."  Did patient suffer any verbal/emotional/physical/sexual abuse as a child?: No Did patient suffer from severe childhood neglect?: No Has patient ever been sexually abused/assaulted/raped as an adolescent or adult?: No Was the patient ever a victim of a crime or a disaster?: No Witnessed domestic violence?: No Has patient been effected by domestic violence as an adult?: No  Education:  Highest grade of school patient has completed: master's degree in education  Currently a student?: No Learning disability?: No  Employment/Work Situation:  Employment situation: On disability Why is patient on disability: schizophrenia How long has patient been on disability: 1999 Patient's job has been impacted by current illness: No (n/a) What is the longest time patient has a held a job?: 20 years  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 I have no recollection  of this. ) Alcohol/Substance Abuse Treatment Hx: Past Tx, Inpatient;Past Tx, Outpatient If yes, describe treatment: I  see Dr. B in [place] for med managment.  Has alcohol/substance abuse ever caused legal problems?: No  Social Support System:  Patient's Community Support System: Poor Describe Community Support System: I don't have many friends. My family (kids) and exwife are my main supports. Type of faith/religion: christian How does patient's faith help to cope with current illness?: n/a   Leisure/Recreation:  Leisure and Hobbies: talking to my kids, reading  Strengths/Needs:  What things does the patient do well?: kind, dealing with stress, insightful, learning  In what areas does patient struggle / problems for patient: coping with symtpoms, managing my symptoms   Discharge Plan:  Does patient have access to transportation?: Yes (bus/walk) Will patient be returning to same living situation after discharge?: Yes  Currently receiving community mental health services: Yes [facility]) If no, would patient like referral for services when discharged?: Yes ([place]) (Would like referral to [facility] and [facility]) Does patient have financial barriers related to discharge medications?: No (Medicare)  Summary/Recommendations:   Pt is 54 year old male who resides in [place] with a married couple who are in recovery. Pt presents involuntarily to [facility] due to SI/HI, AH, substance abuse (alcohol and crack cocaine), and for medication stabilization. Currently, pt reports no HI, with passive SI and some AH. Pt states that he relapsed on alcohol  and crack cocaine. He identifies his triggers as "family stress, financial issues, and return of voices." Recommendations for pt include: crisis stabilization, therapeutic milieu, encourage group attendance and participation, medication management for mood stabilization, and development of comprehensive mental wellness/sobriety plan.     Tilden Fossa, MSW, [place] Worker [facility] [phone]

## 2014-12-28 NOTE — BHH Suicide Risk Assessment (Signed)
[place] INPATIENT:  Family/Significant Other Suicide Prevention Education  Suicide Prevention Education:  Patient Refusal for Family/Significant Other Suicide Prevention Education: The patient [name] Herring has refused to provide written consent for family/significant other to be provided Family/Significant Other Suicide Prevention Education during admission and/or prior to discharge.  Physician notified. SPE reviewed with patient and brochure provided. Patient encouraged to return to hospital if having suicidal thoughts, patient verbalized his/her understanding and has no further questions at this time.   Tomie Spizzirri, Casimiro Needle 12/28/2014, 3:32 PM

## 2014-12-28 NOTE — Tx Team (Signed)
Initial Interdisciplinary Treatment Plan   PATIENT STRESSORS: Financial difficulties Health problems Substance abuse   PATIENT STRENGTHS: Ability for insight Average or above average intelligence Capable of independent living Communication skills General fund of knowledge Motivation for treatment/growth   PROBLEM LIST: Problem List/Patient Goals Date to be addressed Date deferred Reason deferred Estimated date of resolution  Substance abuse      Auditory hallucinations      Risk for self harm            :I need medication adjustment to stop the voices; they are telling me to use drugs and alcohol"                                 DISCHARGE CRITERIA:  Ability to meet basic life and health needs Adequate post-discharge living arrangements Improved stabilization in mood, thinking, and/or behavior Motivation to continue treatment in a less acute level of care Need for constant or close observation no longer present Verbal commitment to aftercare and medication compliance Withdrawal symptoms are absent or subacute and managed without 24-hour nursing intervention  PRELIMINARY DISCHARGE PLAN: Attend aftercare/continuing care group Attend 12-step recovery group Return to previous living arrangement  PATIENT/FAMIILY INVOLVEMENT: This treatment plan has been presented to and reviewed with the patient, [name], and/or family member.  The patient and family have been given the opportunity to ask questions and make suggestions.  [name] Herring [facility] 12/28/2014, 11:30 PM

## 2014-12-28 NOTE — Progress Notes (Signed)
Recreation Therapy Notes  Date: 04.04.2016 Time: 9:30am Location: [address] Dayroom   Group Topic: Stress Management  Goal Area(s) Addresses:  Patient will actively participate in stress management techniques presented during session.   Behavioral Response: Engaged, Appropriate   Intervention: Stress management techniques   Activity :  Deep Breathing and Progressive Muscle Relaxation. Patients were provided education and instruction for completing both deep breathing and progressive muscle relaxation.   Education:  Stress Management, Discharge Planning.   Education Outcome: Acknowledges education  Clinical Observations/Feedback: Patient actively participated in both techniques, displayed ability to practice independently post d/c and expressed no concerns.    Laureen Ochs Anthany Thornhill, LRT/CTRS  Lane Hacker 12/28/2014 3:45 PM

## 2014-12-28 NOTE — Progress Notes (Signed)
D: Patient is alert and oriented. Pt's mood and affect is depressed and blunted. Pt denies SI/HI and VH. Pt reports auditory command hallucinations, pt states "I always hear things." Pt denies having any withdrawal symptoms. Notable body odor. Pt experiencing HTN today (See docflowsheet-vitals). Pt complains of headache this evening, 9/10. Pt observed to be drowsy throughout the day. Pt is attending unit groups today. A: Active listening by RN. Encouragement/Support provided to pt. MD, [place] made aware of pt's HTN. Medication education reviewed with pt. PRN medication administered for pain per providers orders (See MAR). Scheduled medications administered per providers orders (See MAR). 15 minute checks continued per protocol for patient safety.  R: Pt verbally agrees not to follow command hallucinations and pt agrees to come to staff with increasingly intense auditory hallucinations. Patient cooperative and receptive to nursing interventions. Pt remains safe.

## 2014-12-28 NOTE — BHH Group Notes (Signed)
[place] LCSW Group Therapy 12/28/2014  1:15 pm  Type of Therapy: Group Therapy Participation Level: Active  Participation Quality: Attentive, Sharing and Supportive  Affect: Depressed and Flat  Cognitive: Alert and Oriented  Insight: Developing/Improving and Engaged  Engagement in Therapy: Developing/Improving and Engaged  Modes of Intervention: Clarification, Confrontation, Discussion, Education, Exploration,  Limit-setting, Orientation, Problem-solving, Rapport Building, Art therapist, Socialization and Support  Summary of Progress/Problems: Pt identified obstacles faced currently and processed barriers involved in overcoming these obstacles. Pt identified steps necessary for overcoming these obstacles and explored motivation (internal and external) for facing these difficulties head on. Pt further identified one area of concern in their lives and chose a goal to focus on for today. Patient identified his goal as to find new housing as his current housing is not a positive environment for him. Patient also disclosed his HIV status and provided support to another patient dealing with similar issues. CSW and other group members provided patient with support and encouragement.  Tilden Fossa, MSW, [place] Worker [facility] [phone]

## 2014-12-28 NOTE — Progress Notes (Signed)
[facility] MD Progress Note  12/28/2014 4:18 PM [name] Herring  MRN:  [id number] Subjective:   Patient states "I am not hearing the voices as loud. But I always hear voices. I am still pretty depressed. I'm not wanting to hurt myself today.  At least I am away from the roommates that wore me down. I start drinking and using crack."  Objective:   Patient is seen and chart is reviewed. Patient is a 54 year old male who had initially presented to [place] reporting suicidal thoughts and worsening auditory hallucinations. He reports ongoing symptoms of depression and psychosis. Rates his depression at eight. Patient is able to contract for his safety on the unit. Reports the voices have decreased but is still paranoid. He expresses remorse over his relapse stating "I know better than that." Patient reports feeling better after helping a peer process a similar problem today. He is compliant with his medications and he denies any adverse effects.   Principal Problem: Alcohol use disorder, severe, dependence Diagnosis:   Patient Active Problem List   Diagnosis Date Noted  . Cocaine use disorder, severe, dependence [F14.20] 12/27/2014  . Alcohol use disorder, severe, dependence [F10.20] 12/27/2014  . Hx of schizophrenia [Z86.59] 12/27/2014  . Psychosis [F29] 12/27/2014  . ERECTILE DYSFUNCTION [F52.8] 09/03/2007  . FOOT PAIN, BILATERAL [M79.609] 07/10/2007  . Human immunodeficiency virus (HIV) disease [B20] 11/09/2006  . VIRAL MENINGITIS [A87.9] 11/09/2006  . THRUSH [B37.0] 11/09/2006  . 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 HYPOTHYROIDISM [E03.9] 11/09/2006  . DISORDER, BIPOLAR NOS [F31.9] 11/09/2006  . PYELONEPHRITIS [N12] 11/09/2006   Total Time spent with patient: 30 minutes  Past Medical History:  Past Medical History  Diagnosis Date  . Hypertension   . HIV (human immunodeficiency virus infection)   . Schizophrenia   . Depression    History reviewed. No pertinent past surgical  history. Family History: History reviewed. No pertinent family history. Social History:  History  Alcohol Use  . Yes    Comment: Daily      History  Drug Use  . Yes  . Special: Cocaine    History   Social History  . Marital Status: Divorced    Spouse Name: N/A  . Number of Children: N/A  . Years of Education: N/A   Social History Main Topics  . Smoking status: Current Every Day Smoker -- 1.00 packs/day    Types: Cigarettes  . Smokeless tobacco: Not on file  . Alcohol Use: Yes     Comment: Daily   . Drug Use: Yes    Special: Cocaine  . Sexual Activity: No   Other Topics Concern  . None   Social History Narrative   Additional History:    Sleep: Fair  Appetite:  Fair  Assessment:   Musculoskeletal: Strength & Muscle Tone: within normal limits Gait & Station: unsteady Patient leans: N/A  Psychiatric Specialty Exam: Physical Exam  Review of Systems  Constitutional: Negative.   HENT: Negative.   Eyes: Negative.   Respiratory: Negative.   Cardiovascular: Negative.   Gastrointestinal: Negative.   Genitourinary: Negative.   Musculoskeletal: Negative.   Skin: Negative.   Neurological: Positive for tremors.  Endo/Heme/Allergies: Negative.   Psychiatric/Behavioral: Positive for depression, suicidal ideas, hallucinations and substance abuse. Negative for memory loss. The patient is nervous/anxious. The patient does not have insomnia.     Blood pressure 137/99, pulse 107, temperature 98 F (36.7 C), temperature source Oral, resp. rate 16, height 5\' 7"  (1.702 m),  weight 64.411 kg (142 lb), SpO2 99 %.Body mass index is 22.24 kg/(m^2).  General Appearance: Casual  Eye Contact::  Good  Speech:  Clear and Coherent  Volume:  Normal  Mood:  Anxious  Affect:  Depressed  Thought Process:  Goal Directed and Intact  Orientation:  Full (Time, Place, and Person)  Thought Content:  Hallucinations: Auditory and Paranoid Ideation  Suicidal Thoughts:  Yes.  without  intent/plan  Homicidal Thoughts:  No  Memory:  Immediate;   Good Recent;   Good Remote;   Good  Judgement:  Fair  Insight:  Fair  Psychomotor Activity:  Tremor  Concentration:  Fair  Recall:  Good  Fund of Knowledge:Fair  Language: Fair  Akathisia:  Negative  Handed:  Right  AIMS (if indicated):     Assets:  Communication Skills Desire for Improvement Leisure Time Physical Health Resilience Social Support  ADL's:  Intact  Cognition: WNL  Sleep:  Number of Hours: 6.5   Current Medications: Current Facility-Administered Medications  Medication Dose Route Frequency Provider Last Rate Last Dose  . acetaminophen (TYLENOL) tablet 650 mg  650 mg Oral Q6H PRN Delfin Gant, NP      . alum & mag hydroxide-simeth (MAALOX/MYLANTA) 200-200-20 MG/5ML suspension 30 mL  30 mL Oral Q4H PRN Delfin Gant, NP      . benztropine (COGENTIN) tablet 0.5 mg  0.5 mg Oral Q2000 Shuvon B Rankin, NP   0.5 mg at 12/27/14 1956  . chlordiazePOXIDE (LIBRIUM) capsule 25 mg  25 mg Oral Q6H PRN Ursula Alert, MD      . chlordiazePOXIDE (LIBRIUM) capsule 25 mg  25 mg Oral TID Ursula Alert, MD   25 mg at 12/28/14 1152   Followed by  . [START ON 12/29/2014] chlordiazePOXIDE (LIBRIUM) capsule 25 mg  25 mg Oral BH-qamhs Ursula Alert, MD       Followed by  . [START ON 12/31/2014] chlordiazePOXIDE (LIBRIUM) capsule 25 mg  25 mg Oral Daily Saramma Eappen, MD      . gabapentin (NEURONTIN) capsule 600 mg  600 mg Oral Q2000 Delfin Gant, NP   600 mg at 12/27/14 1956  . gabapentin (NEURONTIN) capsule 600 mg  600 mg Oral Q2000 Shuvon B Rankin, NP   0 mg at 12/27/14 2000  . hydrochlorothiazide (MICROZIDE) capsule 12.5 mg  12.5 mg Oral Daily Delfin Gant, NP   12.5 mg at 12/28/14 0802  . hydrOXYzine (ATARAX/VISTARIL) tablet 25 mg  25 mg Oral Q6H PRN Ursula Alert, MD   25 mg at 12/27/14 2203  . lamiVUDine-zidovudine (COMBIVIR) 150-300 MG per tablet 1 tablet  1 tablet Oral BID Shuvon B Rankin, NP   1  tablet at 12/28/14 0802  . loperamide (IMODIUM) capsule 2-4 mg  2-4 mg Oral PRN Ursula Alert, MD      . lopinavir-ritonavir (KALETRA) 200-50 MG per tablet 2 tablet  2 tablet Oral BID Shuvon B Rankin, NP   2 tablet at 12/28/14 0802  . magnesium hydroxide (MILK OF MAGNESIA) suspension 30 mL  30 mL Oral Daily PRN Delfin Gant, NP      . mirtazapine (REMERON) tablet 45 mg  45 mg Oral Q2000 Shuvon B Rankin, NP   45 mg at 12/27/14 1956  . multivitamin with minerals tablet 1 tablet  1 tablet Oral Daily Ursula Alert, MD   1 tablet at 12/28/14 0802  . nicotine (NICODERM CQ - dosed in mg/24 hours) patch 21 mg  21 mg Transdermal Q0600 Saramma  Eappen, MD   21 mg at 12/28/14 0621  . ondansetron (ZOFRAN-ODT) disintegrating tablet 4 mg  4 mg Oral Q6H PRN Ursula Alert, MD      . risperiDONE (RISPERDAL) tablet 4 mg  4 mg Oral Q2000 Shuvon B Rankin, NP   4 mg at 12/27/14 1956  . thiamine (VITAMIN B-1) tablet 100 mg  100 mg Oral Daily Ursula Alert, MD   100 mg at 12/28/14 0802    Lab Results: No results found for this or any previous visit (from the past 48 hour(s)).  Physical Findings: AIMS: Facial and Oral Movements Muscles of Facial Expression: None, normal Lips and Perioral Area: None, normal Jaw: None, normal Tongue: None, normal,Extremity Movements Upper (arms, wrists, hands, fingers): None, normal Lower (legs, knees, ankles, toes): None, normal, Trunk Movements Neck, shoulders, hips: None, normal, Overall Severity Severity of abnormal movements (highest score from questions above): None, normal Incapacitation due to abnormal movements: None, normal Patient's awareness of abnormal movements (rate only patient's report): No Awareness, Dental Status Current problems with teeth and/or dentures?: No Does patient usually wear dentures?: No  CIWA:  CIWA-Ar Total: 2 COWS:     Treatment Plan Summary: Daily contact with patient to assess and evaluate symptoms and progress in treatment and  Medication management  Continue crisis management and stabilization.  Medication management:  Continue Librium protocol for alcohol detox. Continue Risperdal 4 mg daily for psychosis Continue Remeron 45 mg hs for depression Continue Neurontin 600 mg daily for mood stability.  Encouraged patient to attend groups and participate in group counseling sessions and activities.  Discharge plan in progress.  Continue current treatment plan.  Address health issues: Continue medications for HIV infection as ordered.   Medical Decision Making:  Established Problem, Stable/Improving (1), Review of Psycho-Social Stressors (1), Review or order clinical lab tests (1), Review of Medication Regimen & Side Effects (2) and Review of New Medication or Change in Dosage (2)  DAVIS, LAURA NP-C 12/28/2014, 4:18 PM   Agree with progress note as above

## 2014-12-28 NOTE — Plan of Care (Signed)
Problem: Ineffective individual coping Goal: STG: Patient will remain free from self harm Outcome: Progressing Patient remains free from self harm. 15 minute checks continued per protocol for patient safety.   Problem: Alteration in mood & ability to function due to Goal: LTG-Pt reports reduction in suicidal thoughts (Patient reports reduction in suicidal thoughts and is able to verbalize a safety plan for whenever patient is feeling suicidal)  Outcome: Progressing Patient denies having any suicidal thoughts today.  Problem: Diagnosis: Increased Risk For Suicide Attempt Goal: STG-Patient Will Comply With Medication Regime Outcome: Progressing Patient has adhered to medication regimen today with ease.  Problem: Alteration in thought process Goal: STG-Patient does not respond to command hallucinations Outcome: Progressing Patient endorses having command hallucinations today but verbally contracts not to follow commands. Pt has not followed commands today.

## 2014-12-28 NOTE — Progress Notes (Signed)
Patient did attend the evening speaker AA meeting.  

## 2014-12-29 NOTE — Progress Notes (Signed)
[facility] MD Progress Note  12/29/2014 7:49 PM [name] Herring  MRN:  [id number] Subjective:  [name] states he had done really well since [facility]. States that he moved in to a different place and the environment there was conductive to his relapse. He plans to get another place. He states the saw the old signs coming back and he wanted to do something before it got out of control. For the most part he does well with his medications to control his psychiatric symptoms but states that if he was to continue to use the medications would not be effective Principal Problem: Alcohol use disorder, severe, dependence Diagnosis:   Patient Active Problem List   Diagnosis Date Noted  . Cocaine use disorder, severe, dependence [F14.20] 12/27/2014  . Alcohol use disorder, severe, dependence [F10.20] 12/27/2014  . Hx of schizophrenia [Z86.59] 12/27/2014  . Psychosis [F29] 12/27/2014  . ERECTILE DYSFUNCTION [F52.8] 09/03/2007  . FOOT PAIN, BILATERAL [M79.609] 07/10/2007  . Human immunodeficiency virus (HIV) disease [B20] 11/09/2006  . VIRAL MENINGITIS [A87.9] 11/09/2006  . THRUSH [B37.0] 11/09/2006  . 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 HYPOTHYROIDISM [E03.9] 11/09/2006  . DISORDER, BIPOLAR NOS [F31.9] 11/09/2006  . PYELONEPHRITIS [N12] 11/09/2006   Total Time spent with patient: 30 minutes   Past Medical History:  Past Medical History  Diagnosis Date  . Hypertension   . HIV (human immunodeficiency virus infection)   . Schizophrenia   . Depression    History reviewed. No pertinent past surgical history. Family History: History reviewed. No pertinent family history. Social History:  History  Alcohol Use  . Yes    Comment: Daily      History  Drug Use  . Yes  . Special: Cocaine    History   Social History  . Marital Status: Divorced    Spouse Name: N/A  . Number of Children: N/A  . Years of Education: N/A   Social History Main Topics  . Smoking status:  Current Every Day Smoker -- 1.00 packs/day    Types: Cigarettes  . Smokeless tobacco: Not on file  . Alcohol Use: Yes     Comment: Daily   . Drug Use: Yes    Special: Cocaine  . Sexual Activity: No   Other Topics Concern  . None   Social History Narrative   Additional History:    Sleep: Fair  Appetite:  Fair   Assessment:   Musculoskeletal: Strength & Muscle Tone: within normal limits Gait & Station: normal Patient leans: N/A   Psychiatric Specialty Exam: Physical Exam  Review of Systems  Constitutional: Positive for malaise/fatigue.  HENT: Negative.   Eyes: Negative.   Respiratory: Negative.   Cardiovascular: Negative.   Gastrointestinal: Negative.   Genitourinary: Negative.   Musculoskeletal: Negative.   Skin: Negative.   Neurological: Positive for weakness.  Endo/Heme/Allergies: Negative.   Psychiatric/Behavioral: Positive for depression and substance abuse.    Blood pressure 135/92, pulse 90, temperature 98.4 F (36.9 C), temperature source Oral, resp. rate 20, height 5\' 7"  (1.702 m), weight 64.411 kg (142 lb), SpO2 99 %.Body mass index is 22.24 kg/(m^2).  General Appearance: Fairly Groomed  Engineer, water::  Fair  Speech:  Clear and Coherent  Volume:  fluctuates  Mood:  Anxious and worried  Affect:  anxious worried  Thought Process:  Coherent and Goal Directed  Orientation:  Full (Time, Place, and Person)  Thought Content:  symptoms events worries concerns  Suicidal Thoughts:  No  Homicidal Thoughts:  No  Memory:  Immediate;   Fair Recent;   Fair Remote;   Fair  Judgement:  Fair  Insight:  Present  Psychomotor Activity:  Restlessness  Concentration:  Fair  Recall:  [employer] of Knowledge:Fair  Language: Fair  Akathisia:  No  Handed:  Right  AIMS (if indicated):     Assets:  Desire for Improvement  ADL's:  Intact  Cognition: WNL  Sleep:  Number of Hours: 6.75     Current Medications: Current Facility-Administered Medications   Medication Dose Route Frequency Provider Last Rate Last Dose  . acetaminophen (TYLENOL) tablet 650 mg  650 mg Oral Q6H PRN Delfin Gant, NP   650 mg at 12/29/14 1645  . alum & mag hydroxide-simeth (MAALOX/MYLANTA) 200-200-20 MG/5ML suspension 30 mL  30 mL Oral Q4H PRN Delfin Gant, NP      . benztropine (COGENTIN) tablet 0.5 mg  0.5 mg Oral Q2000 Shuvon B Rankin, NP   0.5 mg at 12/29/14 1942  . chlordiazePOXIDE (LIBRIUM) capsule 25 mg  25 mg Oral Q6H PRN Ursula Alert, MD      . chlordiazePOXIDE (LIBRIUM) capsule 25 mg  25 mg Oral BH-qamhs Ursula Alert, MD       Followed by  . [START ON 12/31/2014] chlordiazePOXIDE (LIBRIUM) capsule 25 mg  25 mg Oral Daily Saramma Eappen, MD      . gabapentin (NEURONTIN) capsule 600 mg  600 mg Oral Q2000 Delfin Gant, NP   600 mg at 12/29/14 1942  . hydrochlorothiazide (MICROZIDE) capsule 12.5 mg  12.5 mg Oral Daily Delfin Gant, NP   12.5 mg at 12/29/14 0755  . hydrOXYzine (ATARAX/VISTARIL) tablet 25 mg  25 mg Oral Q6H PRN Ursula Alert, MD   25 mg at 12/27/14 2203  . lamiVUDine-zidovudine (COMBIVIR) 150-300 MG per tablet 1 tablet  1 tablet Oral BID Shuvon B Rankin, NP   1 tablet at 12/29/14 1643  . loperamide (IMODIUM) capsule 2-4 mg  2-4 mg Oral PRN Ursula Alert, MD      . lopinavir-ritonavir (KALETRA) 200-50 MG per tablet 2 tablet  2 tablet Oral BID Shuvon B Rankin, NP   2 tablet at 12/29/14 1643  . magnesium hydroxide (MILK OF MAGNESIA) suspension 30 mL  30 mL Oral Daily PRN Delfin Gant, NP      . mirtazapine (REMERON) tablet 45 mg  45 mg Oral Q2000 Shuvon B Rankin, NP   45 mg at 12/29/14 1942  . multivitamin with minerals tablet 1 tablet  1 tablet Oral Daily Ursula Alert, MD   1 tablet at 12/29/14 0755  . nicotine (NICODERM CQ - dosed in mg/24 hours) patch 21 mg  21 mg Transdermal Q0600 Ursula Alert, MD   21 mg at 12/29/14 0609  . ondansetron (ZOFRAN-ODT) disintegrating tablet 4 mg  4 mg Oral Q6H PRN Ursula Alert, MD       . risperiDONE (RISPERDAL) tablet 4 mg  4 mg Oral Q2000 Shuvon B Rankin, NP   4 mg at 12/29/14 1942  . thiamine (VITAMIN B-1) tablet 100 mg  100 mg Oral Daily Ursula Alert, MD   100 mg at 12/29/14 0755    Lab Results: No results found for this or any previous visit (from the past 48 hour(s)).  Physical Findings: AIMS: Facial and Oral Movements Muscles of Facial Expression: None, normal Lips and Perioral Area: None, normal Jaw: Mild (Pt reports this is normal and he always does this, MD aware) Tongue:  Minimal (Pt reports this is normal and he always does this, MD aware),Extremity Movements Upper (arms, wrists, hands, fingers): None, normal Lower (legs, knees, ankles, toes): None, normal, Trunk Movements Neck, shoulders, hips: None, normal, Overall Severity Severity of abnormal movements (highest score from questions above): None, normal Incapacitation due to abnormal movements: None, normal Patient's awareness of abnormal movements (rate only patient's report): No Awareness, Dental Status Current problems with teeth and/or dentures?: No Does patient usually wear dentures?: No  CIWA:  CIWA-Ar Total: 1 COWS:     Treatment Plan Summary: Daily contact with patient to assess and evaluate symptoms and progress in treatment and Medication management Supportive approach/coping skills Alcohol dependence; complete the Librium detox protocol/ work a relapse prevention plan Mood/psychotic disorder: continue the Risperdal and optimize response Depression: continue the Remeron at 45 mg HS CBT/miondfulness Medical Decision Making:  Review of Psycho-Social Stressors (1), Review or order clinical lab tests (1), Review of Medication Regimen & Side Effects (2) and Review of New Medication or Change in Dosage (2)     Pattrick Bady A 12/29/2014, 7:49 PM

## 2014-12-29 NOTE — Progress Notes (Signed)
D: Patient is alert and oriented. Pt's mood and affect is depressed and blunted. Pt denies SI/HI and VH. Pt reports auditory command hallucinations. Pt experiencing HTN today, denies symptoms (See docflowsheet-vitals). Pt is attending unit groups. Pt observed chattering teeth, and pt moves tongue at times, pt reports this is normal for him and he always does it, these symptoms did not worsen throughout the day. Pt complains of headache this evening 9/10 with relief from PRN medication. A: Active listening by RN. Encouragement/Support provided to pt. MD, Sabra Heck and NP, Agnus made aware of pt's BP and oral/mouth movements. Medication education reviewed with pt. PRN medication administered for pain per providers orders (See MAR). Scheduled medications administered per providers orders (See MAR). 15 minute checks continued per protocol for patient safety.  R: Pt verbally contracts for safety and agrees not to follow command hallucinations; pt agrees to come to staff with increased intensity of command hallucinations. Patient cooperative and receptive to nursing interventions. Pt remains safe.

## 2014-12-29 NOTE — Tx Team (Signed)
Interdisciplinary Treatment Plan Update (Adult) Date: 12/29/2014   Time Reviewed: 9:30 AM  Progress in Treatment: Attending groups: Yes Participating in groups: Yes Taking medication as prescribed: Yes Tolerating medication: Yes Family/Significant other contact made: No, patient has declined for CSW to make collateral contact Patient understands diagnosis: Yes Discussing patient identified problems/goals with staff: Yes Medical problems stabilized or resolved: Yes Denies suicidal/homicidal ideation: Yes Issues/concerns per patient self-inventory: Yes Other:  New problem(s) identified: N/A  Discharge Plan or Barriers: 4/5: Patient plans to return home to follow up with [facility] services. [facility] screening scheduled for Thursday 4/7.  Reason for Continuation of Hospitalization:  Depression Anxiety Medication Stabilization   Comments: N/A  Estimated length of stay: 3-5 days  For review of initial/current patient goals, please see plan of care. Patient is a 54 year old African American Male admitted for SI and substance abuse. Patient lives in [place] with 2 roommates. Patient will benefit from crisis stabilization, medication evaluation, group therapy, and psycho education in addition to case management for discharge planning. Patient and CSW reviewed pt's identified goals and treatment plan. Pt verbalized understanding and agreed to treatment plan.   Attendees: Patient:    Family:    Physician: Dr. Parke Poisson; Dr. Sabra Heck; Dr. Darleene Cleaver 12/29/2014 9:30 AM  Nursing: Lawernce Pitts, Darnelle Bos [place], [place] 12/29/2014 9:30 AM  Clinical Social Worker: Tilden Fossa,  [place] 12/29/2014 9:30 AM  Other: Joette Catching, LCSW 12/29/2014 9:30 AM  Other: Lucinda Dell, [place] Sessions Liaison 12/29/2014 9:30 AM  Other: Lars Pinks, Case Manager 12/29/2014 9:30 AM  Other: Ave Filter NP 12/29/2014 9:30 AM  Other: Maxie Better, LCSW 12/29/2014 9:30 AM  Other:    Other:     Other:    Other:       Scribe for Treatment Team:  Tilden Fossa, MSW, [employer] [phone]

## 2014-12-29 NOTE — BHH Group Notes (Signed)
Adult Psychoeducational Group Note  Date:  12/29/2014 Time:  9:16 PM  Group Topic/Focus:  Wrap-Up Group:   The focus of this group is to help patients review their daily goal of treatment and discuss progress on daily workbooks.  Participation Level:  Minimal  Participation Quality:  Appropriate  Affect:  Appropriate  Cognitive:  Appropriate  Insight: Good  Engagement in Group:  Limited  Modes of Intervention:  Discussion  Additional Comments:  Lennix stated he had a good day.  The only bad thing was his visitors didn't come to see him.  He is discharging tomorrow and wants to go to a 90 day program because his roommates "don't care about me".  Victorino Sparrow A 12/29/2014, 9:16 PM

## 2014-12-29 NOTE — BHH Group Notes (Signed)
[place] Group Notes:  (Nursing/MHT/Case Management/Adjunct)  Date:  12/29/2014  Time:  0900am  Type of Therapy:  Nurse Education  Participation Level:  Active  Participation Quality:  Appropriate, attentive  Affect:  Blunted and Lethargic  Cognitive:  Appropriate  Insight:  Appropriate  Engagement in Group:  Engaged and but drowsy at times  Modes of Intervention:  Discussion, Education and Support  Summary of Progress/Problems: Patient attended group, was engaged but was observed to be drowsy at times.  Charlyne Quale A 12/29/2014, 9:43 AM

## 2014-12-29 NOTE — Clinical Social Work Note (Signed)
Per Melissa at [facility], patient has been declined due to active auditory hallucinations.  Tilden Fossa, MSW, [place] Worker [facility] [phone]

## 2014-12-29 NOTE — Progress Notes (Signed)
Recreation Therapy Notes  Animal-Assisted Activity (AAA) Program Checklist/Progress Notes Patient Eligibility Criteria Checklist & Daily Group note for Rec Tx Intervention  Date: 04.05.2016 Time: 2:45pm Location: [address]   AAA/T Program Assumption of Risk Form signed by Patient/ or Parent Legal Guardian yes  Patient is free of allergies or sever asthma yes  Patient reports no fear of animals yes  Patient reports no history of cruelty to animals yes  Patient understands his/her participation is voluntary yes  Patient washes hands before animal contact yes  Patient washes hands after animal contact yes  Behavioral Response: Appropriate   Education: Hand Washing, Appropriate Animal Interaction   Education Outcome: Acknowledges education.   Clinical Observations/Feedback: Patient actively engaged in session, petting therapy dog appropriately and interacting with peers appropriately during session.   Laureen Ochs Fremon Zacharia, LRT/CTRS  Lane Hacker 12/29/2014 5:13 PM

## 2014-12-29 NOTE — BHH Group Notes (Signed)
[place] LCSW Group Therapy 12/29/2014  1:15 PM   Type of Therapy: Group Therapy  Participation Level: Did Not Attend. Patient invited to participate but declined.   Tilden Fossa, MSW, [place] Worker [facility] [phone]

## 2014-12-29 NOTE — Plan of Care (Signed)
Problem: Ineffective individual coping Goal: STG: Patient will remain free from self harm Outcome: Progressing Patient remains free from self harm. 15 minute checks continued per protocol for patient safety.   Problem: Alteration in mood & ability to function due to Goal: LTG-Pt reports reduction in suicidal thoughts (Patient reports reduction in suicidal thoughts and is able to verbalize a safety plan for whenever patient is feeling suicidal)  Outcome: Progressing Patient denies having any suicidal thoughts today.  Problem: Diagnosis: Increased Risk For Suicide Attempt Goal: STG-Patient Will Attend All Groups On The Unit Outcome: Progressing Patient is attending some unit groups today.

## 2014-12-29 NOTE — Progress Notes (Signed)
Pt reports he is feeling about the same as yesterday.  He states he is still hearing voices, but they are not as persistent.  He reports he has been going to groups and participating.  Pt is observed with some mild EPS movements of his mouth, as if he is grinding his teeth, when he came to the med window for his 2000 medications.  He is receiving cogentin scheduled already.  Pt plans to return to his home at discharge.  Pt is cooperative and polite with staff.  Pt makes his needs known to staff.  Support and encouragement offered.  Safety maintained with q15 minute checks.

## 2014-12-30 ENCOUNTER — Encounter (HOSPITAL_COMMUNITY): Payer: Self-pay | Admitting: Registered Nurse

## 2014-12-30 ENCOUNTER — Other Ambulatory Visit: Payer: Self-pay

## 2014-12-30 DIAGNOSIS — F200 Paranoid schizophrenia: Secondary | ICD-10-CM | POA: Diagnosis present

## 2014-12-30 MED ORDER — LOPINAVIR-RITONAVIR 200-50 MG PO TABS: 2.0000 | ORAL_TABLET | Freq: Two times a day (BID) | ORAL | Status: DC

## 2014-12-30 MED ORDER — GABAPENTIN 300 MG PO CAPS: 600.0000 mg | ORAL_CAPSULE | Freq: Every day | ORAL | Status: DC

## 2014-12-30 MED ORDER — LAMIVUDINE-ZIDOVUDINE 150-300 MG PO TABS: 1.0000 | ORAL_TABLET | Freq: Two times a day (BID) | ORAL | Status: DC

## 2014-12-30 MED ORDER — BENZTROPINE MESYLATE 0.5 MG PO TABS: 0.5000 mg | ORAL_TABLET | Freq: Every day | ORAL | Status: DC

## 2014-12-30 MED ORDER — MIRTAZAPINE 45 MG PO TABS: 45.0000 mg | ORAL_TABLET | Freq: Every day | ORAL | Status: DC

## 2014-12-30 MED ORDER — RISPERIDONE 4 MG PO TABS: 4.0000 mg | ORAL_TABLET | Freq: Every day | ORAL | Status: DC

## 2014-12-30 MED ORDER — HYDROCHLOROTHIAZIDE 25 MG PO TABS: 12.5000 mg | ORAL_TABLET | Freq: Every day | ORAL | Status: DC

## 2014-12-30 NOTE — BHH Suicide Risk Assessment (Signed)
[facility] Discharge Suicide Risk Assessment   Demographic Factors:  NA  Total Time spent with patient: 30 minutes  Musculoskeletal: Strength & Muscle Tone: within normal limits Gait & Station: normal Patient leans: Front  Psychiatric Specialty Exam: Physical Exam  Review of Systems  Constitutional: Negative.   HENT: Negative.   Eyes: Negative.   Respiratory: Negative.   Cardiovascular: Negative.   Gastrointestinal: Negative.   Genitourinary: Negative.   Musculoskeletal: Positive for back pain.  Skin: Negative.   Neurological: Negative.   Endo/Heme/Allergies: Negative.   Psychiatric/Behavioral: Positive for substance abuse. The patient is nervous/anxious.     Blood pressure 135/95, pulse 91, temperature 97.8 F (36.6 C), temperature source Oral, resp. rate 20, height 5\' 7"  (1.702 m), weight 64.411 kg (142 lb), SpO2 99 %.Body mass index is 22.24 kg/(m^2).  General Appearance: Fairly Groomed  Engineer, water::  Fair  Speech:  Clear and [id number]  Volume:  Normal  Mood:  Anxious  Affect:  Appropriate  Thought Process:  Coherent and Goal Directed  Orientation:  Full (Time, Place, and Person)  Thought Content:  plans as he moves on, relapse prevention plan  Suicidal Thoughts:  No  Homicidal Thoughts:  No  Memory:  Immediate;   Fair Recent;   Fair Remote;   Fair  Judgement:  Fair  Insight:  Present  Psychomotor Activity:  Normal  Concentration:  Fair  Recall:  [employer] of [place]  Language: Fair  Akathisia:  No  Handed:  Right  AIMS (if indicated):     Assets:  Desire for Improvement  Sleep:  Number of Hours: 6.25  Cognition: WNL  ADL's:  Intact   Have you used any form of tobacco in the last 30 days? (Cigarettes, Smokeless Tobacco, Cigars, and/or Pipes): Yes  Has this patient used any form of tobacco in the last 30 days? (Cigarettes, Smokeless Tobacco, Cigars, and/or Pipes) Yes, A prescription for an FDA-approved tobacco cessation medication was offered at  discharge and the patient refused  Mental Status Per Nursing Assessment::   On Admission:  Suicidal ideation indicated by patient, Suicide plan, Self-harm behaviors  Current Mental Status by Physician: In full contact with reality. There are no active S/S of withdrawal. There are no active SI plans or intent. There ere no active psychotic symptoms. He states he is willing and motivated to pursue further outpatient treatment   Loss Factors: Decline in physical health  Historical Factors: NA  Risk Reduction Factors:   Positive social support  Continued Clinical Symptoms:  Alcohol/Substance Abuse/Dependencies  Cognitive Features That Contribute To Risk:  Closed-mindedness, Polarized thinking and Thought constriction (tunnel vision)    Suicide Risk:  Minimal: No identifiable suicidal ideation.  Patients presenting with no risk factors but with morbid ruminations; may be classified as minimal risk based on the severity of the depressive symptoms  Principal Problem: Alcohol use disorder, severe, dependence Discharge Diagnoses:  Patient Active Problem List   Diagnosis Date Noted  . Cocaine use disorder, severe, dependence [F14.20] 12/27/2014  . Alcohol use disorder, severe, dependence [F10.20] 12/27/2014  . Hx of schizophrenia [Z86.59] 12/27/2014  . Psychosis [F29] 12/27/2014  . ERECTILE DYSFUNCTION [F52.8] 09/03/2007  . FOOT PAIN, BILATERAL [M79.609] 07/10/2007  . Human immunodeficiency virus (HIV) disease [B20] 11/09/2006  . VIRAL MENINGITIS [A87.9] 11/09/2006  . THRUSH [B37.0] 11/09/2006  . 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 HYPOTHYROIDISM [E03.9] 11/09/2006  . DISORDER, BIPOLAR NOS [F31.9] 11/09/2006  . PYELONEPHRITIS [N12] 11/09/2006    Follow-up Information  Follow up with [facility] On 03/04/2015.   Specialty:  Behavioral Health   Why:  Medication management appointment on Thursday June 9th at 10 am with Dr. Margarito Liner. If you need medication assistance or therapy  services prior to appointment date, please go to walk-in clinic Monday-Friday between 8 am to 3 pm to be seen by a provider.    Contact information:   [place] [phone]       Follow up with [facility]  On 12/31/2014.   Why:  Screening appointment for possible admission on Thursday April 7th at 8 am. Please call if you need to reschedule.   Contact information:   [place],  [place], [place] [address] Phone: [phone] Fax: [phone]      Plan Of Care/Follow-up recommendations:  Activity:  as tolerated Diet:  heart healthy Follow up: [facility] as above Is patient on multiple antipsychotic therapies at discharge:  No   Has Patient had three or more failed trials of antipsychotic monotherapy by history:  No  Recommended Plan for Multiple Antipsychotic Therapies: NA    Vail Basista A 12/30/2014, 1:20 PM

## 2014-12-30 NOTE — Progress Notes (Signed)
Pt has been observed sitting in the dayroom watching TV and talking with peers.  He reports he is doing fine and looking forward to being discharged tomorrow.  He says his withdrawal symptoms are gone, but he is still hearing voices which he says will probably never go away.  His plan is to go to a rehab program and then find another place to live.  He says where he lives now is not a good environment.  Pt is pleasant and cooperative with staff.  Pt makes his needs known to staff.  Support and encouragement offered.  Safety maintained with q15 minute checks.
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
## 2014-12-30 NOTE — BHH Group Notes (Signed)
   [facility] LCSW Aftercare Discharge Planning Group Note  12/30/2014  8:45 AM   Participation Quality: Alert, Appropriate and Oriented  Mood/Affect: Appropriate  Depression Rating: 1  Anxiety Rating: 1  Thoughts of Suicide: Pt denies SI/HI  Will you contract for safety? Yes  Current AVH: Pt denies  Plan for Discharge/Comments: Pt attended discharge planning group and actively participated in group. CSW provided pt with today's workbook. Patient reports feeling safe for discharge home today to continue services with [facility]. [facility] screening set for Thursday 4/7 morning.  Transportation Means: Pt reports access to transportation- requesting bus pass  Supports: Patient has identified some family as supportive.  Tilden Fossa, MSW, [place] Worker [facility] [phone]

## 2014-12-30 NOTE — Progress Notes (Addendum)
    [name] Herring is given DC AVS after MD completed DC order and DC SRA. He completes his morning assessment and on it he writes he denies SI and he rates his depression, hopelessness and anxiety " 2/5/4", respectively. He is given prescriptions for his meds as well as samples of meds and then his AVS is reviewed with him and he states he understands all f/u plans and that he will keep DC appt. A All belongings are returned to him and he states engagement in his recovery as evidenced y his saying " I can do this" as he leaves. Pt is dc'[name] per MD order.

## 2014-12-30 NOTE — Progress Notes (Signed)
  [facility] Adult Case Management Discharge Plan :  Will you be returning to the same living situation after discharge:  Yes,  Patient plans to return to his residence At discharge, do you have transportation home?: Yes,  patient requesting bus pass Do you have the ability to pay for your medications: Yes,  patient will be provided medication samples and prescriptions at discharge.  Release of information consent forms completed and in the chart;  Patient's signature needed at discharge.  Patient to Follow up at: Follow-up Information    Follow up with [facility] On 03/04/2015.   Specialty:  Behavioral Health   Why:  Medication management appointment on Thursday June 9th at 10 am with Dr. Margarito Liner. If you need medication assistance or therapy services prior to appointment date, please go to walk-in clinic Monday-Friday between 8 am to 3 pm to be seen by a provider.    Contact information:   [place] [address] [phone]       Follow up with [facility]  On 12/31/2014.   Why:  Screening appointment for possible admission on Thursday April 7th at 8 am. Please call if you need to reschedule.   Contact information:   [place],  [place],  [address] Phone: [phone] Fax: [phone]      Patient denies SI/HI: Yes,  denies    Safety Planning and Suicide Prevention discussed: Yes,  with patient   Have you used any form of tobacco in the last 30 days? (Cigarettes, Smokeless Tobacco, Cigars, and/or Pipes): Yes  Has patient been referred to the Quitline?: Yes, faxed on 12/30/14.  Saory Carriero, Casimiro Needle 12/30/2014, 9:34 AM

## 2015-01-04 NOTE — Progress Notes (Signed)
Patient Discharge Instructions:  After Visit Summary (AVS):   Faxed to:  01/04/15 Discharge Summary Note:   Faxed to:  01/04/15 Psychiatric Admission Assessment Note:   Faxed to:  01/04/15 Suicide Risk Assessment - Discharge Assessment:   Faxed to:  01/04/15 Faxed/Sent to the Next Level Care provider:  01/04/15 Faxed to [facility] @ [phone] Faxed to [facility] @ [place], 01/04/2015, 2:28 PM

## 2015-01-13 ENCOUNTER — Encounter: Payer: Self-pay | Admitting: Infectious Diseases

## 2015-01-13 ENCOUNTER — Telehealth: Payer: Self-pay | Admitting: *Deleted

## 2015-01-13 ENCOUNTER — Encounter: Payer: Self-pay | Admitting: *Deleted

## 2015-01-13 ENCOUNTER — Ambulatory Visit (INDEPENDENT_AMBULATORY_CARE_PROVIDER_SITE_OTHER): Payer: Medicare HMO | Admitting: Infectious Diseases

## 2015-01-13 ENCOUNTER — Other Ambulatory Visit (HOSPITAL_COMMUNITY)
Admission: RE | Admit: 2015-01-13 | Discharge: 2015-01-13 | Disposition: A | Discharge status: Home / Self Care | Payer: Medicare HMO | Source: Ambulatory Visit | Attending: Infectious Diseases | Admitting: Infectious Diseases

## 2015-01-13 VITALS — BP 144/90 | HR 109 | Temp 97.9°F | Ht 71.0 in | Wt 159.0 lb

## 2015-01-13 DIAGNOSIS — Z113 Encounter for screening for infections with a predominantly sexual mode of transmission: Secondary | ICD-10-CM | POA: Diagnosis present

## 2015-01-13 DIAGNOSIS — B20 Human immunodeficiency virus [HIV] disease: Secondary | ICD-10-CM

## 2015-01-13 DIAGNOSIS — D012 Carcinoma in situ of rectum: Secondary | ICD-10-CM

## 2015-01-13 DIAGNOSIS — F1420 Cocaine dependence, uncomplicated: Secondary | ICD-10-CM | POA: Diagnosis not present

## 2015-01-13 DIAGNOSIS — F200 Paranoid schizophrenia: Secondary | ICD-10-CM | POA: Diagnosis not present

## 2015-01-13 DIAGNOSIS — Z79899 Other long term (current) drug therapy: Secondary | ICD-10-CM

## 2015-01-13 DIAGNOSIS — F1020 Alcohol dependence, uncomplicated: Secondary | ICD-10-CM

## 2015-01-13 MED ORDER — ELVITEG-COBIC-EMTRICIT-TENOFAF 150-150-200-10 MG PO TABS: 1.0000 | ORAL_TABLET | Freq: Every day | ORAL | Status: DC

## 2015-01-13 NOTE — Assessment & Plan Note (Signed)
Will change his ART to genvoya.  He will have labs done today.  States he had flu shot this season. Appreciate [facility] f/u for his housing and counseling.  Will see him back in 3 months

## 2015-01-13 NOTE — Progress Notes (Signed)
   Subjective:    Patient ID: [name] Herring, male    DOB: August 21, 1961, 54 y.o.   MRN: [id number]  HPI 54 yo M with hx of HIV+ (08-18-98), previously followed at [facility]. 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 Believes he had surgery for this? Is on KLT/CBV for at least the lat 8 years.   HIV 1 RNA QUANT (copies/mL)  Date Value  09/03/2007 53*  07/10/2007 25500*  11/19/2006 0 (?)   CD4 T CELL ABS (no units)  Date Value  09/03/2007 190*  07/10/2007 140*  10/11/2006 190   Has been having pain in his L shoulder due to a pinched nerve.   Has been seen at [facility]. Alex. Has been going to [employer] as well. Has housing, will be moving next month.  Is going to [place].   PMHx/SOC/FHx reviewed.   16# wt gain.  Review of Systems  Constitutional: Negative for appetite change and unexpected weight change.  Respiratory: Negative for cough and shortness of breath.   Gastrointestinal: Negative for diarrhea and constipation.  Genitourinary: Negative for difficulty urinating.       Objective:   Physical Exam  Constitutional: He appears well-developed and well-nourished.  HENT:  Mouth/Throat: No oropharyngeal exudate.  Eyes: EOM are normal. Pupils are equal, round, and reactive to light.  Neck: Neck supple.  Cardiovascular: Normal rate, regular rhythm and normal heart sounds.   Pulmonary/Chest: Effort normal and breath sounds normal.  Abdominal: Soft. Bowel sounds are normal. He exhibits no distension. There is no tenderness.  Lymphadenopathy:    He has no cervical adenopathy.      Assessment & Plan:

## 2015-01-13 NOTE — Assessment & Plan Note (Signed)
States he has been clean, appreciate f/u at [facility].

## 2015-01-13 NOTE — Assessment & Plan Note (Signed)
States he drinks very little, only once per month, appreciate f/u at [facility].

## 2015-01-13 NOTE — Assessment & Plan Note (Signed)
Greatly appreciate f/u at [facility].  Will refill his meds prn.

## 2015-01-13 NOTE — Telephone Encounter (Signed)
Appointment made at [facility] for 4/28 at 9:40 (9:00 arrival).  Pt given appointment time, phone number and location; wrote information on AVS.  Pt verbalized understanding, agreement.  Faxed info to [phone] [employer]. Landis Gandy, RN

## 2015-01-13 NOTE — Addendum Note (Signed)
Addended by: Dolan Amen D on: 01/13/2015 04:15 PM   Modules accepted: Orders

## 2015-01-13 NOTE — Assessment & Plan Note (Signed)
Will have him seen by [facility]

## 2015-01-14 ENCOUNTER — Telehealth: Payer: Self-pay | Admitting: *Deleted

## 2015-01-14 LAB — URINE CYTOLOGY ANCILLARY ONLY
Chlamydia: NEGATIVE
Neisseria Gonorrhea: NEGATIVE

## 2015-01-14 NOTE — Telephone Encounter (Signed)
Faxed demographics, insurance, and last office note to [facility] for patient's appointment 4/28.  Labs not yet available. [facility] fax: [phone]

## 2015-01-15 LAB — COMPREHENSIVE METABOLIC PANEL

## 2015-01-15 LAB — CBC

## 2015-01-15 LAB — HLA B*5701

## 2015-01-15 LAB — HIV-1 RNA ULTRAQUANT REFLEX TO GENTYP+

## 2015-01-15 LAB — LIPID PANEL

## 2015-01-15 LAB — RPR

## 2015-01-15 LAB — T-HELPER CELL (CD4) - (RCID CLINIC ONLY)
CD4 T CELL ABS: 310 /uL — AB (ref 400–2700)
CD4 T CELL HELPER: 23 % — AB (ref 33–55)

## 2015-01-20 ENCOUNTER — Other Ambulatory Visit: Payer: Self-pay

## 2015-01-27 ENCOUNTER — Telehealth: Payer: Self-pay | Admitting: *Deleted

## 2015-01-28 NOTE — Telephone Encounter (Signed)
Called [phone] [facility] Genetic Guidance line) for approval of HLA-B*5701.  Pended, reference [id number].  As directed, faxed office notes for nurse review to [phone].   Landis Gandy, RN

## 2015-02-03 NOTE — Telephone Encounter (Signed)
HLA-B*5701 is approved, reference #[id number], valid 01/13/15 - 07/15/15.  Lab notified. Patient will come for labs 03/10/15. Landis Gandy, RN

## 2015-03-10 ENCOUNTER — Other Ambulatory Visit: Payer: Self-pay

## 2015-03-10 DIAGNOSIS — F29 Unspecified psychosis not due to a substance or known physiological condition: Secondary | ICD-10-CM | POA: Diagnosis not present

## 2015-03-11 ENCOUNTER — Other Ambulatory Visit: Payer: Medicare HMO

## 2015-03-24 DIAGNOSIS — F29 Unspecified psychosis not due to a substance or known physiological condition: Secondary | ICD-10-CM | POA: Diagnosis not present

## 2015-04-14 ENCOUNTER — Ambulatory Visit: Payer: Self-pay | Admitting: Infectious Diseases

## 2015-05-29 DIAGNOSIS — R45851 Suicidal ideations: Secondary | ICD-10-CM | POA: Diagnosis not present

## 2015-05-29 DIAGNOSIS — Z765 Malingerer [conscious simulation]: Secondary | ICD-10-CM | POA: Diagnosis not present

## 2015-05-29 DIAGNOSIS — R45850 Homicidal ideations: Secondary | ICD-10-CM | POA: Diagnosis not present

## 2015-06-09 ENCOUNTER — Ambulatory Visit: Payer: Self-pay | Admitting: Infectious Diseases

## 2015-08-05 ENCOUNTER — Other Ambulatory Visit: Payer: Self-pay | Admitting: Infectious Diseases

## 2015-08-05 ENCOUNTER — Other Ambulatory Visit: Payer: Medicare Other

## 2015-08-05 DIAGNOSIS — Z79899 Other long term (current) drug therapy: Secondary | ICD-10-CM

## 2015-08-05 DIAGNOSIS — B20 Human immunodeficiency virus [HIV] disease: Secondary | ICD-10-CM

## 2015-08-05 LAB — CBC WITH DIFFERENTIAL/PLATELET
BASOS ABS: 0 10*3/uL (ref 0.0–0.1)
Basophils Relative: 0 % (ref 0–1)
Eosinophils Absolute: 0 10*3/uL (ref 0.0–0.7)
Eosinophils Relative: 0 % (ref 0–5)
HCT: 42.9 % (ref 39.0–52.0)
HEMOGLOBIN: 14.7 g/dL (ref 13.0–17.0)
LYMPHS ABS: 1.7 10*3/uL (ref 0.7–4.0)
Lymphocytes Relative: 30 % (ref 12–46)
MCH: 33.3 pg (ref 26.0–34.0)
MCHC: 34.3 g/dL (ref 30.0–36.0)
MCV: 97.3 fL (ref 78.0–100.0)
MONOS PCT: 9 % (ref 3–12)
MPV: 10.3 fL (ref 8.6–12.4)
Monocytes Absolute: 0.5 10*3/uL (ref 0.1–1.0)
Neutro Abs: 3.5 10*3/uL (ref 1.7–7.7)
Neutrophils Relative %: 61 % (ref 43–77)
Platelets: 165 10*3/uL (ref 150–400)
RBC: 4.41 MIL/uL (ref 4.22–5.81)
RDW: 15.1 % (ref 11.5–15.5)
WBC: 5.8 10*3/uL (ref 4.0–10.5)

## 2015-08-05 LAB — COMPREHENSIVE METABOLIC PANEL
ALBUMIN: 4.2 g/dL (ref 3.6–5.1)
ALT: 16 U/L (ref 9–46)
AST: 28 U/L (ref 10–35)
Alkaline Phosphatase: 60 U/L (ref 40–115)
BILIRUBIN TOTAL: 0.4 mg/dL (ref 0.2–1.2)
BUN: 16 mg/dL (ref 7–25)
CHLORIDE: 106 mmol/L (ref 98–110)
CO2: 28 mmol/L (ref 20–31)
Calcium: 10.5 mg/dL — ABNORMAL HIGH (ref 8.6–10.3)
Creat: 1.05 mg/dL (ref 0.70–1.33)
Glucose, Bld: 118 mg/dL — ABNORMAL HIGH (ref 65–99)
Potassium: 4.5 mmol/L (ref 3.5–5.3)
SODIUM: 142 mmol/L (ref 135–146)
Total Protein: 7.1 g/dL (ref 6.1–8.1)

## 2015-08-05 LAB — LIPID PANEL
CHOLESTEROL: 153 mg/dL (ref 125–200)
HDL: 93 mg/dL (ref >=40)
LDL Cholesterol: 30 mg/dL (ref <130)
TRIGLYCERIDES: 151 mg/dL — AB (ref <150)
Total CHOL/HDL Ratio: 1.6 Ratio (ref <=5.0)
VLDL: 30 mg/dL (ref <30)

## 2015-08-06 LAB — T-HELPER CELLS (CD4) COUNT (NOT AT ARMC)
CD4 % Helper T Cell: 19 % — ABNORMAL LOW (ref 33–55)
CD4 T Cell Abs: 300 /uL — ABNORMAL LOW (ref 400–2700)

## 2015-08-06 LAB — RPR

## 2015-08-08 LAB — HIV-1 RNA QUANT-NO REFLEX-BLD: HIV-1 RNA Quant, Log: <1.3 Log copies/mL (ref <1.30)

## 2015-08-12 LAB — HLA B*5701: HLA-B 5701 W/RFLX HLA-B HIGH: NEGATIVE

## 2015-08-25 ENCOUNTER — Encounter: Payer: Self-pay | Admitting: Infectious Diseases

## 2015-08-25 ENCOUNTER — Ambulatory Visit (INDEPENDENT_AMBULATORY_CARE_PROVIDER_SITE_OTHER): Payer: Medicare Other | Admitting: Infectious Diseases

## 2015-08-25 VITALS — BP 159/100 | HR 76 | Wt 151.0 lb

## 2015-08-25 DIAGNOSIS — Z113 Encounter for screening for infections with a predominantly sexual mode of transmission: Secondary | ICD-10-CM

## 2015-08-25 DIAGNOSIS — D012 Carcinoma in situ of rectum: Secondary | ICD-10-CM

## 2015-08-25 DIAGNOSIS — F200 Paranoid schizophrenia: Secondary | ICD-10-CM

## 2015-08-25 DIAGNOSIS — I10 Essential (primary) hypertension: Secondary | ICD-10-CM

## 2015-08-25 DIAGNOSIS — B20 Human immunodeficiency virus [HIV] disease: Secondary | ICD-10-CM

## 2015-08-25 DIAGNOSIS — Z79899 Other long term (current) drug therapy: Secondary | ICD-10-CM

## 2015-08-25 DIAGNOSIS — Z23 Encounter for immunization: Secondary | ICD-10-CM | POA: Diagnosis not present

## 2015-08-25 NOTE — Assessment & Plan Note (Signed)
He is dedicated to taking his psych meds.  Feels like his mood is good.

## 2015-08-25 NOTE — Assessment & Plan Note (Signed)
Is off his rx.  He will restart, will f/u at next visit Asx.

## 2015-08-25 NOTE — Progress Notes (Signed)
   Subjective:    Patient ID: [name] Herring, male    DOB: 1960-12-13, 54 y.o.   MRN: [id number]  HPI 54 yo M with hx of HIV+ (08-18-98), previously followed at [facility]. 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 Believes he had surgery for this? Was previously on KLT/CBV until 12-2014 when he was changed to genvoya.  He was also refered to surgery for his AIN,  He missed this apt.   Has no problems with genvoya. Has been constipated which has been chronic. Has no tried no OTCs. Forgot to take anti-htn rx today.    HIV 1 RNA QUANT (copies/mL)  Date Value  08/05/2015 <20  01/13/2015 CANCELED  09/03/2007 53*   CD4 T CELL ABS  Date Value  08/05/2015 300 /uL*  01/13/2015 310 /uL*  09/03/2007 190*    Review of Systems  Constitutional: Negative for appetite change and unexpected weight change.  Cardiovascular: Negative for chest pain.  Gastrointestinal: Positive for constipation. Negative for diarrhea.  Genitourinary: Negative for difficulty urinating.  Neurological: Positive for headaches.  has occas "stress headache" which he takes advil for.      Objective:   Physical Exam  Constitutional: He appears well-developed and well-nourished.  HENT:  Mouth/Throat: No oropharyngeal exudate.  Eyes: EOM are normal. Pupils are equal, round, and reactive to light.  Neck: Neck supple.  Cardiovascular: Normal rate, regular rhythm and normal heart sounds.   Pulmonary/Chest: Effort normal and breath sounds normal.  Abdominal: Soft. Bowel sounds are normal. There is no tenderness. There is no rebound.  Musculoskeletal: He exhibits no edema.  Lymphadenopathy:    He has no cervical adenopathy.       Assessment & Plan:

## 2015-08-25 NOTE — Addendum Note (Signed)
Addended by: Amado Coe on: 08/25/2015 02:50 PM   Modules accepted: Orders

## 2015-08-25 NOTE — Assessment & Plan Note (Signed)
Gets flu shot, condoms today.  He is doing well on new meds.  [place]  Will see him back in 6 months with labs prior.

## 2015-08-25 NOTE — Assessment & Plan Note (Signed)
Will send back to surgery

## 2015-09-14 DIAGNOSIS — D013 Carcinoma in situ of anus and anal canal: Secondary | ICD-10-CM | POA: Diagnosis not present

## 2015-11-11 ENCOUNTER — Encounter: Payer: Self-pay | Admitting: Infectious Diseases

## 2015-12-06 ENCOUNTER — Telehealth: Payer: Self-pay | Admitting: *Deleted

## 2015-12-06 NOTE — Telephone Encounter (Signed)
i would restart his meds as soon as we can, check labs after he has been on them at least 1 month

## 2015-12-06 NOTE — Telephone Encounter (Signed)
[name] Herring, case manager back and requested her assistance in scheduling the patient with financial counseling to obtain his medications  and a lab f/u in one month. [name]

## 2015-12-06 NOTE — Telephone Encounter (Signed)
[place] [phone], Anne Ng called asking if she does patient's ADAP application, would an MD sign an Rx to submit to [place], [place]. Advised her that [facility] can accommodate patient with his ADAP application and we would be able to get him in this week to see our Development worker, community. She stated she will call [facility] to see if he is actually current with his ADAP. He has actually been off his HIV meds x 1 month. Is it ok to start back or would patient need to come in and have lab work done prior to restarting his meds? [name]

## 2016-01-03 ENCOUNTER — Other Ambulatory Visit: Payer: Self-pay | Admitting: Infectious Diseases

## 2016-01-03 DIAGNOSIS — B20 Human immunodeficiency virus [HIV] disease: Secondary | ICD-10-CM

## 2016-01-26 ENCOUNTER — Emergency Department (HOSPITAL_COMMUNITY)
Admission: EM | Admit: 2016-01-26 | Discharge: 2016-01-27 | Disposition: A | Discharge status: Other Acute Inpatient Hospital | Payer: Medicare Other | Attending: Emergency Medicine | Admitting: Emergency Medicine

## 2016-01-26 ENCOUNTER — Encounter (HOSPITAL_COMMUNITY): Payer: Self-pay | Admitting: Emergency Medicine

## 2016-01-26 DIAGNOSIS — F17210 Nicotine dependence, cigarettes, uncomplicated: Secondary | ICD-10-CM | POA: Diagnosis not present

## 2016-01-26 DIAGNOSIS — Z91040 Latex allergy status: Secondary | ICD-10-CM | POA: Insufficient documentation

## 2016-01-26 DIAGNOSIS — B20 Human immunodeficiency virus [HIV] disease: Secondary | ICD-10-CM | POA: Diagnosis not present

## 2016-01-26 DIAGNOSIS — R440 Auditory hallucinations: Secondary | ICD-10-CM

## 2016-01-26 DIAGNOSIS — Z880 Allergy status to penicillin: Secondary | ICD-10-CM | POA: Diagnosis not present

## 2016-01-26 DIAGNOSIS — F1410 Cocaine abuse, uncomplicated: Secondary | ICD-10-CM | POA: Diagnosis not present

## 2016-01-26 DIAGNOSIS — Z5900 Homelessness unspecified: Secondary | ICD-10-CM

## 2016-01-26 DIAGNOSIS — I10 Essential (primary) hypertension: Secondary | ICD-10-CM | POA: Diagnosis not present

## 2016-01-26 DIAGNOSIS — F209 Schizophrenia, unspecified: Secondary | ICD-10-CM | POA: Insufficient documentation

## 2016-01-26 DIAGNOSIS — F329 Major depressive disorder, single episode, unspecified: Secondary | ICD-10-CM | POA: Diagnosis present

## 2016-01-26 DIAGNOSIS — F419 Anxiety disorder, unspecified: Secondary | ICD-10-CM | POA: Insufficient documentation

## 2016-01-26 DIAGNOSIS — F1910 Other psychoactive substance abuse, uncomplicated: Secondary | ICD-10-CM

## 2016-01-26 HISTORY — DX: Personality disorder, unspecified: F60.9

## 2016-01-26 HISTORY — DX: Immunodeficiency, unspecified: D84.9

## 2016-01-26 MED ORDER — ONDANSETRON HCL 4 MG PO TABS: 4.0000 mg | ORAL_TABLET | Freq: Three times a day (TID) | ORAL | Status: DC | PRN

## 2016-01-26 MED ORDER — ACETAMINOPHEN 325 MG PO TABS
650.0000 mg | ORAL_TABLET | ORAL | Status: DC | PRN
  Administered 2016-01-27: 650 mg via ORAL
  Filled 2016-01-26: qty 2

## 2016-01-26 MED ORDER — RISPERIDONE 0.5 MG PO TABS
4.0000 mg | ORAL_TABLET | Freq: Every day | ORAL | Status: DC
  Administered 2016-01-27: 4 mg via ORAL
  Filled 2016-01-26: qty 8

## 2016-01-26 MED ORDER — BENZTROPINE MESYLATE 1 MG PO TABS
0.5000 mg | ORAL_TABLET | Freq: Every day | ORAL | Status: DC
  Administered 2016-01-27: 0.5 mg via ORAL
  Filled 2016-01-26: qty 1

## 2016-01-26 MED ORDER — MIRTAZAPINE 15 MG PO TABS
45.0000 mg | ORAL_TABLET | Freq: Every day | ORAL | Status: DC
  Administered 2016-01-27: 45 mg via ORAL
  Filled 2016-01-26: qty 3

## 2016-01-26 MED ORDER — GABAPENTIN 300 MG PO CAPS
600.0000 mg | ORAL_CAPSULE | Freq: Every day | ORAL | Status: DC
  Administered 2016-01-27: 600 mg via ORAL
  Filled 2016-01-26: qty 2

## 2016-01-26 MED ORDER — HYDROCHLOROTHIAZIDE 25 MG PO TABS
12.5000 mg | ORAL_TABLET | Freq: Every day | ORAL | Status: DC
  Administered 2016-01-27: 12.5 mg via ORAL
  Filled 2016-01-26: qty 1

## 2016-01-26 NOTE — ED Provider Notes (Signed)
CSN: [id number]     Arrival date & time 01/26/16  2129 History   First MD Initiated Contact with Patient 01/26/16 2205     Chief Complaint  Patient presents with  . Generalized Body Aches  . Depression     (Consider location/radiation/quality/duration/timing/severity/associated sxs/prior Treatment) HPI Patient brought by EMS. Patient reports that he used to be doing well when he had his schizophrenia medications. Ports that Medicaid will pay for them anymore so he has able to have them for while. He reports he is having increasing visual and auditory hallucinations. At this time, he reports he is living on a friend's porch. He reports he is not sure what happened tonight. He reports he just hurts everywhere. He has not had fever that he knows of. He denies other localizing symptoms. He states he may have smoked crack cocaine, he is not completely sure though. Past Medical History  Diagnosis Date  . Hypertension   . HIV (human immunodeficiency virus infection) ([place])   . Schizophrenia ([place])   . Depression   . Immune deficiency disorder ([place])   . Personality disorder    History reviewed. No pertinent past surgical history. Family History  Problem Relation Age of Onset  . CAD Mother   . Kidney disease Sister   . Lupus Brother   . Cancer Maternal Grandmother   . Stroke Paternal Grandmother    Social History  Substance Use Topics  . Smoking status: Current Every Day Smoker -- 1.00 packs/day    Types: Cigarettes  . Smokeless tobacco: None  . Alcohol Use: 0.0 oz/week    0 Standard drinks or equivalent per week     Comment: Daily     Review of Systems    Allergies  Latex and Amoxicillin  Home Medications   Prior to Admission medications   Medication Sig Start Date End Date Taking? Authorizing Provider  benztropine (COGENTIN) 0.5 MG tablet Take 1 tablet (0.5 mg total) by mouth daily at 8 pm. For drug induced extrapyramidal reaction Patient not taking: Reported on 01/26/2016  12/30/14   Shuvon B Rankin, NP  gabapentin (NEURONTIN) 300 MG capsule Take 2 capsules (600 mg total) by mouth daily at 8 pm. For agitation and alcohol withdrawal syndrome Patient not taking: Reported on 01/26/2016 12/30/14   Shuvon B Rankin, NP  GENVOYA 150-150-200-10 MG TABS tablet TAKE 1 TABLET BY MOUTH DAILY WITH BREAKFAST. Patient not taking: Reported on 01/26/2016 01/03/16   Campbell Riches, MD  hydrochlorothiazide (HYDRODIURIL) 25 MG tablet Take 0.5 tablets (12.5 mg total) by mouth daily. For high blood pressure Patient not taking: Reported on 01/26/2016 12/30/14   Shuvon B Rankin, NP  mirtazapine (REMERON) 45 MG tablet Take 1 tablet (45 mg total) by mouth daily at 8 pm. For depression and sleep Patient not taking: Reported on 01/26/2016 12/30/14   Shuvon B Rankin, NP  Multiple Vitamin (MULTIVITAMIN WITH MINERALS) TABS tablet Take 1 tablet by mouth daily. Patient not taking: Reported on 01/26/2016 09/26/14   Patrecia Pour, NP  risperiDONE (RISPERDAL) 4 MG tablet Take 1 tablet (4 mg total) by mouth daily at 8 pm. For schizophrenia Patient not taking: Reported on 01/26/2016 12/30/14   Shuvon B Rankin, NP   BP 160/110 mmHg  Pulse 74  Temp(Src) 98 F (36.7 C) (Oral)  Resp 18  SpO2 99% Physical Exam  Constitutional: He is oriented to person, place, and time.  Patient is very thin. No respiratory distress. He appears mildly anxious. Nontoxic.  HENT:  Head: Normocephalic and atraumatic.  Nose: Nose normal.  Mouth/Throat: Oropharynx is clear and moist.  Eyes: EOM are normal.  Pupils are very constricted at 1 mm.  Neck: Neck supple.  Cardiovascular: Normal rate, regular rhythm, normal heart sounds and intact distal pulses.   Pulmonary/Chest: Effort normal and breath sounds normal.  Abdominal: Soft. Bowel sounds are normal.  Musculoskeletal: Normal range of motion. He exhibits no edema or tenderness.  Neurological: He is alert and oriented to person, place, and time. No cranial nerve deficit. He exhibits  normal muscle tone.  Patient is tremulous. He is oriented 3. No focal extremity dysfunction.  Skin: Skin is warm and dry.  Psychiatric:  Anxious.    ED Course  Procedures (including critical care time) Labs Review Labs Reviewed  CBC WITH DIFFERENTIAL/PLATELET - Abnormal; Notable for the following:    WBC 3.5 (*)    RBC 3.88 (*)    MCV 101.3 (*)    All other components within normal limits  COMPREHENSIVE METABOLIC PANEL  ETHANOL  SALICYLATE LEVEL  ACETAMINOPHEN LEVEL  URINALYSIS, ROUTINE W REFLEX MICROSCOPIC (NOT AT [facility])  URINE RAPID DRUG SCREEN, HOSP PERFORMED    Imaging Review No results found. I have personally reviewed and evaluated these images and lab results as part of my medical decision-making.   EKG Interpretation None      MDM   Final diagnoses:  Schizophrenia, unspecified type ([place])  Auditory hallucinations  Homeless  HIV (human immunodeficiency virus infection) ([place])  Polysubstance abuse   Patient is complex history. He has schizophrenia but has been unable to obtain medications. Increasing auditory and visual hallucinations. Time patient does not endorse suicidal ideation. He has had recent drug use. This chronic. Patient is oriented 3. He is complaining of generalized pain but has no localizing symptoms. Patient has known HIV but reports he does not have his medications currently. Vital signs are stable. Plan will be for TTS consult for decompensated schizophrenia with comorbid homelessness and substance abuse.    Charlesetta Shanks, MD 01/27/16 [phone]

## 2016-01-26 NOTE — ED Notes (Addendum)
Pt here via [place], from [employer] with c/o "pain all over". States he doesn't remember how he got to friend's house..."I think I may have smoked some crack cocaine, but I don't remember if I did or not". Pt alert, calm, cooperative and appropriate to this RN's questions at present. Reports having "multiple personality disorder and schizophrenia". States he's been off his medication "for a few months, because I missed my appointment and they wouldn't refill it". States he moves "back and forth from [place] to [place] all the time". Denies SI/HI at present, but states he "always thinks about dying when my pain is this bad.Marland KitchenMarland KitchenMarland KitchenI just feel really depressed".

## 2016-01-27 ENCOUNTER — Inpatient Hospital Stay (HOSPITAL_COMMUNITY): Admission: EM | Admit: 2016-01-27 | Payer: Medicare Other | Source: Intra-hospital | Admitting: Psychiatry

## 2016-01-27 LAB — CBC WITH DIFFERENTIAL/PLATELET
BASOS ABS: 0 10*3/uL (ref 0.0–0.1)
BASOS PCT: 0 %
EOS ABS: 0 10*3/uL (ref 0.0–0.7)
EOS PCT: 1 %
HEMATOCRIT: 39.3 % (ref 39.0–52.0)
HEMOGLOBIN: 13.1 g/dL (ref 13.0–17.0)
LYMPHS ABS: 1.1 10*3/uL (ref 0.7–4.0)
Lymphocytes Relative: 31 %
MCH: 33.8 pg (ref 26.0–34.0)
MCHC: 33.3 g/dL (ref 30.0–36.0)
MCV: 101.3 fL — AB (ref 78.0–100.0)
MONOS PCT: 13 %
Monocytes Absolute: 0.5 10*3/uL (ref 0.1–1.0)
Neutro Abs: 2 10*3/uL (ref 1.7–7.7)
Neutrophils Relative %: 55 %
Platelets: 153 10*3/uL (ref 150–400)
RBC: 3.88 MIL/uL — AB (ref 4.22–5.81)
RDW: 15.2 % (ref 11.5–15.5)
WBC: 3.5 10*3/uL — ABNORMAL LOW (ref 4.0–10.5)

## 2016-01-27 LAB — ACETAMINOPHEN LEVEL: Acetaminophen (Tylenol), Serum: <10 ug/mL — ABNORMAL LOW (ref 10–30)

## 2016-01-27 LAB — COMPREHENSIVE METABOLIC PANEL
ALT: 19 U/L (ref 17–63)
AST: 25 U/L (ref 15–41)
Albumin: 3.5 g/dL (ref 3.5–5.0)
Alkaline Phosphatase: 45 U/L (ref 38–126)
Anion gap: 9 (ref 5–15)
BILIRUBIN TOTAL: 0.8 mg/dL (ref 0.3–1.2)
BUN: 13 mg/dL (ref 6–20)
CO2: 24 mmol/L (ref 22–32)
CREATININE: 0.94 mg/dL (ref 0.61–1.24)
Calcium: 9.5 mg/dL (ref 8.9–10.3)
Chloride: 108 mmol/L (ref 101–111)
GFR calc Af Amer: >60 mL/min (ref >60)
Glucose, Bld: 98 mg/dL (ref 65–99)
POTASSIUM: 3.3 mmol/L — AB (ref 3.5–5.1)
Sodium: 141 mmol/L (ref 135–145)
TOTAL PROTEIN: 7.3 g/dL (ref 6.5–8.1)

## 2016-01-27 LAB — URINE MICROSCOPIC-ADD ON

## 2016-01-27 LAB — SALICYLATE LEVEL: Salicylate Lvl: <4 mg/dL (ref 2.8–30.0)

## 2016-01-27 LAB — URINALYSIS, ROUTINE W REFLEX MICROSCOPIC
GLUCOSE, UA: NEGATIVE mg/dL
KETONES UR: 15 mg/dL — AB
LEUKOCYTES UA: NEGATIVE
NITRITE: POSITIVE — AB
PH: 6 (ref 5.0–8.0)
Protein, ur: NEGATIVE mg/dL
SPECIFIC GRAVITY, URINE: 1.028 (ref 1.005–1.030)

## 2016-01-27 LAB — RAPID URINE DRUG SCREEN, HOSP PERFORMED
AMPHETAMINES: NOT DETECTED
BARBITURATES: NOT DETECTED
BENZODIAZEPINES: NOT DETECTED
COCAINE: POSITIVE — AB
Opiates: NOT DETECTED
Tetrahydrocannabinol: NOT DETECTED

## 2016-01-27 LAB — ETHANOL

## 2016-01-27 MED ORDER — ELVITEG-COBIC-EMTRICIT-TENOFAF 150-150-200-10 MG PO TABS: ORAL_TABLET | ORAL | Status: DC

## 2016-01-27 NOTE — ED Notes (Signed)
A snack and drink given to patient and a regular diet ordered for dinner.

## 2016-01-27 NOTE — ED Provider Notes (Signed)
Patient accepted at behavioral health by Dr. Shea Evans.  Veryl Speak, MD 01/27/16 [phone]

## 2016-01-27 NOTE — Discharge Planning (Signed)
EDCM consulted to assist with getting pt 2 weeks of 042 meds prior to transfer to [facility] today.  [facility] contacted pharmacy and was instructed to tube Rx to pharmacy and meds will be tubed back.  [facility] informed EDSW of process and she will complete process.

## 2016-01-27 NOTE — ED Notes (Signed)
Attempted to call report to [facility] told pt no longer had a bed and would need to seek placement elsewhere.  Charge RN made aware.

## 2016-01-27 NOTE — ED Notes (Signed)
Per [facility]) pt declined d/t substance use.

## 2016-01-27 NOTE — Progress Notes (Signed)
[facility] Olivia Mackie) states pt is accepted to [facility] by Dr. Dareen Piano, report [phone]. States per Dr Dareen Piano, acceptance is pending pt supplying HIV medications and pt be IVC'd (states that this is for safety reasons as per assessment pt experiencing AVH and SI)

## 2016-01-27 NOTE — Progress Notes (Signed)
Spoke with [facility] AC-Unable to transfer pt to [facility] at this time. AC advised that pt still considered for admission upon appropriate bed availability.  Made outside referrals: [facility]- per [place], able to offer admission pending pt being able to supply HIV medications from home as facility cannot supply them. Pt states his medications are delivered at residence and is looking into way of having someone pick them up and deliver to ED. Also inquired with care manager re: ability of ED pharmacy to supply 10 day meds to cover stay. [facility] can be updated if meds found- [phone] [facility]- per [facility]- per Myrtie Cruise, MSW, LCSW Clinical Social Work, Disposition  01/27/2016 [phone]

## 2016-01-27 NOTE — ED Notes (Addendum)
Pt changed into paper scrubs and wanded by security.  Attempted to call reports to [facility].  They report that they will be unable to take pt until 1130.  Breakfast tray ordered for pt.

## 2016-01-27 NOTE — ED Notes (Signed)
Pt ambulated to restroom and is eating breakfast tray at this time.

## 2016-01-27 NOTE — BH Assessment (Addendum)
Tele Assessment Note   [name] Herring is an 55 y.o. male.  -Clinician reviewed note by Dr. Charlesetta Shanks.  Patient brought by EMS. Patient reports that he used to be doing well when he had his schizophrenia medications. Ports that Medicaid will pay for them anymore so he has able to have them for while. He reports he is having increasing visual and auditory hallucinations. At this time, he reports he is living on a friend's porch. He reports he is not sure what happened tonight. He reports he just hurts everywhere. He has not had fever that he knows of. He denies other localizing symptoms. He states he may have smoked crack cocaine, he is not completely sure though,  Patient told a friend that he felt like he wanted to kill himself.  Patient's friend called EMS and they brought him to [facility].  Patient says he has been off medication for a long time.  He says that medicaid does not pay for his medications.  Patient says he has not been to a psychiatrist in over a year.  Patient says "I want to kill myself."  Patient has no specific plan but says "I have lots of them."  Patient denies any HI at this time.  He says he hears voices screaming at him to kill himself.  Patient sees people without faces.  Patient is currently homeless.  Patient uses ETOH and crack but is very unclear about how much and how often.  Patient's UDS has not resulted yet.  Patient has not had outpatient services in a year he says.  Patient was at [facility] in 04/16 and 10/15.  He is willing to come in to [facility] if a bed is available.  -Clinician discussed patient care with Patriciaann Clan, PA  Diagnosis: Schizophrenia  Past Medical History:  Past Medical History  Diagnosis Date  . Hypertension   . HIV (human immunodeficiency virus infection) ([place])   . Schizophrenia ([place])   . Depression   . Immune deficiency disorder ([place])   . Personality disorder     History reviewed. No pertinent past surgical history.  Family History:   Family History  Problem Relation Age of Onset  . CAD Mother   . Kidney disease Sister   . Lupus Brother   . Cancer Maternal Grandmother   . Stroke Paternal Grandmother     Social History:  reports that he has been smoking Cigarettes.  He has been smoking about 1.00 pack per day. He does not have any smokeless tobacco history on file. He reports that he drinks alcohol. He reports that he uses illicit drugs (Cocaine).  Additional Social History:  Alcohol / Drug Use Pain Medications: None Prescriptions: Cymbalta, Seroquel, Rimeron, Neurontin, Resperdal.  Has not been taking.  Says his MCD won't pay for them. Over the Counter: N/A History of alcohol / drug use?: Yes Substance #1 Name of Substance 1: ETOH 1 - Age of First Use: Teens 1 - Amount (size/oz): Varies 1 - Frequency: At least four times in a month 1 - Duration: on-going 1 - Last Use / Amount: Can't recall Substance #2 Name of Substance 2: Crack cocaine 2 - Age of First Use: Unknown 2 - Amount (size/oz): "A little bit and a lot, I don't know" 2 - Frequency: At least weekly 2 - Duration: on-going 2 - Last Use / Amount: Tonight  CIWA: CIWA-Ar BP: (!) 155/111 mmHg Pulse Rate: 69 COWS:    PATIENT STRENGTHS: (choose at least two) Average or  above average intelligence Capable of independent living Communication skills  Allergies:  Allergies  Allergen Reactions  . Latex Shortness Of Breath  . Amoxicillin Rash    Home Medications:  (Not in a hospital admission)  OB/GYN Status:  No LMP for male patient.  General Assessment Data Location of Assessment: [facility] ED TTS Assessment: In system Is this a Tele or Face-to-Face Assessment?: Tele Assessment Is this an Initial Assessment or a Re-assessment for this encounter?: Initial Assessment Marital status: Single Is patient pregnant?: No Pregnancy Status: No Living Arrangements: Other (Comment) ("Support group housing") Can pt return to current living arrangement?:  Yes Admission Status: Voluntary Is patient capable of signing voluntary admission?: Yes Referral Source: Self/Family/Friend Insurance type: [facility]     Crisis Care Plan Living Arrangements: Other (Comment) ("Support group housing") Name of Psychiatrist: none Name of Therapist: None  Education Status Is patient currently in school?: No Highest grade of school patient has completed: BA degree  Risk to self with the past 6 months Suicidal Ideation: Yes-Currently Present Has patient been a risk to self within the past 6 months prior to admission? : Yes Suicidal Intent: Yes-Currently Present Has patient had any suicidal intent within the past 6 months prior to admission? : Yes Is patient at risk for suicide?: Yes Suicidal Plan?: No Has patient had any suicidal plan within the past 6 months prior to admission? : Yes Access to Means: No What has been your use of drugs/alcohol within the last 12 months?: ETOH & crack Previous Attempts/Gestures: Yes How many times?:  (Multiple times) Other Self Harm Risks: No Triggers for Past Attempts: Hallucinations, Unpredictable Intentional Self Injurious Behavior: None Family Suicide History: No Recent stressful life event(s): Other (Comment) (Pt with out medication.) Persecutory voices/beliefs?: Yes Depression: Yes Depression Symptoms: Despondent, Isolating, Guilt, Loss of interest in usual pleasures, Feeling worthless/self pity Substance abuse history and/or treatment for substance abuse?: Yes Suicide prevention information given to non-admitted patients: Not applicable  Risk to Others within the past 6 months Homicidal Ideation: No Does patient have any lifetime risk of violence toward others beyond the six months prior to admission? : No Thoughts of Harm to Others: No Current Homicidal Intent: No Current Homicidal Plan: No Access to Homicidal Means: No Identified Victim: No one History of harm to others?: No Assessment of Violence: None  Noted Violent Behavior Description: N/A Does patient have access to weapons?: No Criminal Charges Pending?: No Does patient have a court date: No Is patient on probation?: No  Psychosis Hallucinations: Auditory, Visual (Voices screaming at him.  Sees people w/o faces) Delusions: None noted  Mental Status Report Appearance/Hygiene: Disheveled, Poor hygiene, Body odor, In hospital gown Eye Contact: Poor Motor Activity: Freedom of movement, Unremarkable Speech: Logical/coherent, Rapid Level of Consciousness: Drowsy Mood: Depressed, Despair, Empty, Helpless, Sad Affect: Depressed, Sad Anxiety Level: Moderate Thought Processes: Coherent Judgement: Impaired Orientation: Appropriate for developmental age Obsessive Compulsive Thoughts/Behaviors: None  Cognitive Functioning Concentration: Poor Memory: Recent Impaired, Remote Intact IQ: Average Insight: Poor Impulse Control: Poor Appetite: Fair Weight Loss: 0 Weight Gain: 0 Sleep: Decreased Total Hours of Sleep:  (<4H/D) Vegetative Symptoms: None  ADLScreening [facility] Assessment Services) Patient's cognitive ability adequate to safely complete daily activities?: Yes Patient able to express need for assistance with ADLs?: Yes Independently performs ADLs?: Yes (appropriate for developmental age)  Prior Inpatient Therapy Prior Inpatient Therapy: Yes Prior Therapy Dates: 04/16, 10/15 Prior Therapy Facilty/Provider(s): [facility] Reason for Treatment: depression, psychosis  Prior Outpatient Therapy Prior Outpatient Therapy: Yes Prior  Therapy Dates: Year ago Prior Therapy Facilty/Provider(s): Pt unclear Reason for Treatment: med management Does patient have an ACCT team?: No Does patient have Intensive In-House Services?  : No Does patient have [facility] services? : No Does patient have P4CC services?: No  ADL Screening (condition at time of admission) Patient's cognitive ability adequate to safely complete daily activities?: Yes Is  the patient deaf or have difficulty hearing?: No Does the patient have difficulty seeing, even when wearing glasses/contacts?: No Does the patient have difficulty concentrating, remembering, or making decisions?: Yes Patient able to express need for assistance with ADLs?: Yes Does the patient have difficulty dressing or bathing?: No Independently performs ADLs?: Yes (appropriate for developmental age) Does the patient have difficulty walking or climbing stairs?: No Weakness of Legs: None Weakness of Arms/Hands: None       Abuse/Neglect Assessment (Assessment to be complete while patient is alone) Physical Abuse: Denies Verbal Abuse: Denies Sexual Abuse: Denies Exploitation of patient/patient's resources: Denies Self-Neglect: Denies     Regulatory affairs officer (For Healthcare) Does patient have an advance directive?: No Would patient like information on creating an advanced directive?: No - patient declined information    Additional Information 1:1 In Past 12 Months?: No CIRT Risk: No Elopement Risk: No Does patient have medical clearance?: Yes     Disposition:  Disposition Initial Assessment Completed for this Encounter: Yes Disposition of Patient: Inpatient treatment program, Referred to Type of inpatient treatment program: Adult Patient referred to:  (Pt to be reviewed with PA)  Curlene Dolphin Ray 01/27/2016 12:48 AM

## 2016-01-27 NOTE — ED Provider Notes (Signed)
Patient accepted to OSH. Dr. Dareen Piano is accepting.  Sherwood Gambler, MD 01/27/16 1754

## 2016-01-27 NOTE — ED Notes (Signed)
This RN spoke with Olivia Mackie at [facility].  Pt verbally agreed to sign self in voluntary and Olivia Mackie states no need to IVC.  Olivia Mackie reports pt will be going to [facility], admitting MD Dr. Dareen Piano.  REport number:  [phone]

## 2016-01-27 NOTE — ED Notes (Signed)
Per Jinny Blossom, [facility] CSW, pt has potential bed at [facility] if pt can provide HIV meds. Per pt, meds are available at [facility] (home?).  Pt left 2 voicemails requesting meds to be brought to [facility] ED.  Megan made aware.  Megan advised [place] CSW or RN could update [facility] at following number.  [facility]  [phone]

## 2016-01-27 NOTE — BH Assessment (Signed)
[facility] Assessment Progress Note   Clinician discussed patient care with Patriciaann Clan, PA.  He recommended inpatient psychiatric care.  Patient accepted to [facility] [id number], services of Dr. Shea Evans.  Nurse Jethro Bastos notified of patient being accepted to [facility].  Clinician also informed Dr. Colen Darling about patient voluntary acceptance.  Patient can come to [facility] after 08:00.

## 2016-01-28 DIAGNOSIS — F209 Schizophrenia, unspecified: Secondary | ICD-10-CM | POA: Diagnosis not present

## 2016-01-29 DIAGNOSIS — F209 Schizophrenia, unspecified: Secondary | ICD-10-CM | POA: Diagnosis not present

## 2016-01-30 DIAGNOSIS — F209 Schizophrenia, unspecified: Secondary | ICD-10-CM | POA: Diagnosis not present

## 2016-01-31 DIAGNOSIS — F209 Schizophrenia, unspecified: Secondary | ICD-10-CM | POA: Diagnosis not present

## 2016-02-01 DIAGNOSIS — F209 Schizophrenia, unspecified: Secondary | ICD-10-CM | POA: Diagnosis not present

## 2016-02-02 DIAGNOSIS — F209 Schizophrenia, unspecified: Secondary | ICD-10-CM | POA: Diagnosis not present

## 2016-02-03 DIAGNOSIS — F209 Schizophrenia, unspecified: Secondary | ICD-10-CM | POA: Diagnosis not present

## 2016-02-04 DIAGNOSIS — F209 Schizophrenia, unspecified: Secondary | ICD-10-CM | POA: Diagnosis not present

## 2016-02-05 DIAGNOSIS — F209 Schizophrenia, unspecified: Secondary | ICD-10-CM | POA: Diagnosis not present

## 2016-02-06 DIAGNOSIS — F209 Schizophrenia, unspecified: Secondary | ICD-10-CM | POA: Diagnosis not present

## 2016-02-07 DIAGNOSIS — F209 Schizophrenia, unspecified: Secondary | ICD-10-CM | POA: Diagnosis not present

## 2016-02-08 ENCOUNTER — Other Ambulatory Visit: Payer: Self-pay

## 2016-02-08 DIAGNOSIS — F209 Schizophrenia, unspecified: Secondary | ICD-10-CM | POA: Diagnosis not present

## 2016-02-10 DIAGNOSIS — F250 Schizoaffective disorder, bipolar type: Secondary | ICD-10-CM | POA: Diagnosis not present

## 2016-02-11 DIAGNOSIS — F250 Schizoaffective disorder, bipolar type: Secondary | ICD-10-CM | POA: Diagnosis not present

## 2016-02-12 DIAGNOSIS — F250 Schizoaffective disorder, bipolar type: Secondary | ICD-10-CM | POA: Diagnosis not present

## 2016-02-13 DIAGNOSIS — F250 Schizoaffective disorder, bipolar type: Secondary | ICD-10-CM | POA: Diagnosis not present

## 2016-02-14 DIAGNOSIS — F250 Schizoaffective disorder, bipolar type: Secondary | ICD-10-CM | POA: Diagnosis not present

## 2016-02-15 DIAGNOSIS — F250 Schizoaffective disorder, bipolar type: Secondary | ICD-10-CM | POA: Diagnosis not present

## 2016-02-16 DIAGNOSIS — F250 Schizoaffective disorder, bipolar type: Secondary | ICD-10-CM | POA: Diagnosis not present

## 2016-02-17 DIAGNOSIS — F250 Schizoaffective disorder, bipolar type: Secondary | ICD-10-CM | POA: Diagnosis not present

## 2016-02-18 DIAGNOSIS — F250 Schizoaffective disorder, bipolar type: Secondary | ICD-10-CM | POA: Diagnosis not present

## 2016-02-19 DIAGNOSIS — F250 Schizoaffective disorder, bipolar type: Secondary | ICD-10-CM | POA: Diagnosis not present

## 2016-02-20 DIAGNOSIS — F250 Schizoaffective disorder, bipolar type: Secondary | ICD-10-CM | POA: Diagnosis not present

## 2016-02-21 DIAGNOSIS — F250 Schizoaffective disorder, bipolar type: Secondary | ICD-10-CM | POA: Diagnosis not present

## 2016-02-22 ENCOUNTER — Ambulatory Visit: Payer: Self-pay | Admitting: Infectious Diseases

## 2016-02-22 ENCOUNTER — Telehealth: Payer: Self-pay | Admitting: *Deleted

## 2016-02-22 DIAGNOSIS — F250 Schizoaffective disorder, bipolar type: Secondary | ICD-10-CM | POA: Diagnosis not present

## 2016-02-22 NOTE — Telephone Encounter (Signed)
Unable to contact pt, no answer. 

## 2016-02-23 DIAGNOSIS — R918 Other nonspecific abnormal finding of lung field: Secondary | ICD-10-CM | POA: Diagnosis not present

## 2016-02-23 DIAGNOSIS — F250 Schizoaffective disorder, bipolar type: Secondary | ICD-10-CM | POA: Diagnosis not present

## 2016-02-23 DIAGNOSIS — R7611 Nonspecific reaction to tuberculin skin test without active tuberculosis: Secondary | ICD-10-CM | POA: Diagnosis not present

## 2016-02-24 DIAGNOSIS — F250 Schizoaffective disorder, bipolar type: Secondary | ICD-10-CM | POA: Diagnosis not present

## 2016-02-25 ENCOUNTER — Other Ambulatory Visit: Payer: Self-pay | Admitting: Infectious Diseases

## 2016-02-25 DIAGNOSIS — F250 Schizoaffective disorder, bipolar type: Secondary | ICD-10-CM | POA: Diagnosis not present

## 2016-02-26 DIAGNOSIS — F250 Schizoaffective disorder, bipolar type: Secondary | ICD-10-CM | POA: Diagnosis not present

## 2016-02-27 DIAGNOSIS — F250 Schizoaffective disorder, bipolar type: Secondary | ICD-10-CM | POA: Diagnosis not present

## 2016-02-28 DIAGNOSIS — F250 Schizoaffective disorder, bipolar type: Secondary | ICD-10-CM | POA: Diagnosis not present

## 2016-02-29 DIAGNOSIS — F250 Schizoaffective disorder, bipolar type: Secondary | ICD-10-CM | POA: Diagnosis not present

## 2016-03-01 DIAGNOSIS — F200 Paranoid schizophrenia: Secondary | ICD-10-CM | POA: Diagnosis not present

## 2016-03-01 DIAGNOSIS — F250 Schizoaffective disorder, bipolar type: Secondary | ICD-10-CM | POA: Diagnosis not present

## 2016-03-02 DIAGNOSIS — F250 Schizoaffective disorder, bipolar type: Secondary | ICD-10-CM | POA: Diagnosis not present

## 2016-03-03 DIAGNOSIS — F250 Schizoaffective disorder, bipolar type: Secondary | ICD-10-CM | POA: Diagnosis not present

## 2016-03-04 DIAGNOSIS — F250 Schizoaffective disorder, bipolar type: Secondary | ICD-10-CM | POA: Diagnosis not present

## 2016-03-05 DIAGNOSIS — F250 Schizoaffective disorder, bipolar type: Secondary | ICD-10-CM | POA: Diagnosis not present

## 2016-03-06 DIAGNOSIS — F250 Schizoaffective disorder, bipolar type: Secondary | ICD-10-CM | POA: Diagnosis not present

## 2016-03-07 DIAGNOSIS — F250 Schizoaffective disorder, bipolar type: Secondary | ICD-10-CM | POA: Diagnosis not present

## 2016-03-08 DIAGNOSIS — F250 Schizoaffective disorder, bipolar type: Secondary | ICD-10-CM | POA: Diagnosis not present

## 2016-03-09 ENCOUNTER — Encounter: Payer: Self-pay | Admitting: *Deleted

## 2016-03-09 DIAGNOSIS — F250 Schizoaffective disorder, bipolar type: Secondary | ICD-10-CM | POA: Diagnosis not present

## 2016-03-09 NOTE — Progress Notes (Signed)
Client was discharged from Medical Case Management with [place], Case Manager [facility] as of 03/09/16, due to inability to complete an appointment for reassessment/re-enrollment into program.  Client has not been able to maintain required monthly contact in order to qualify for Medical Case Management.

## 2016-03-10 DIAGNOSIS — F250 Schizoaffective disorder, bipolar type: Secondary | ICD-10-CM | POA: Diagnosis not present

## 2016-03-11 DIAGNOSIS — F250 Schizoaffective disorder, bipolar type: Secondary | ICD-10-CM | POA: Diagnosis not present

## 2016-03-12 DIAGNOSIS — F250 Schizoaffective disorder, bipolar type: Secondary | ICD-10-CM | POA: Diagnosis not present

## 2016-03-13 DIAGNOSIS — F250 Schizoaffective disorder, bipolar type: Secondary | ICD-10-CM | POA: Diagnosis not present

## 2016-03-14 DIAGNOSIS — F250 Schizoaffective disorder, bipolar type: Secondary | ICD-10-CM | POA: Diagnosis not present

## 2016-03-15 DIAGNOSIS — F250 Schizoaffective disorder, bipolar type: Secondary | ICD-10-CM | POA: Diagnosis not present

## 2016-03-16 DIAGNOSIS — F250 Schizoaffective disorder, bipolar type: Secondary | ICD-10-CM | POA: Diagnosis not present

## 2016-03-17 DIAGNOSIS — F250 Schizoaffective disorder, bipolar type: Secondary | ICD-10-CM | POA: Diagnosis not present

## 2016-03-18 DIAGNOSIS — F250 Schizoaffective disorder, bipolar type: Secondary | ICD-10-CM | POA: Diagnosis not present

## 2016-03-19 DIAGNOSIS — F250 Schizoaffective disorder, bipolar type: Secondary | ICD-10-CM | POA: Diagnosis not present

## 2016-03-20 DIAGNOSIS — F250 Schizoaffective disorder, bipolar type: Secondary | ICD-10-CM | POA: Diagnosis not present

## 2016-03-21 DIAGNOSIS — F250 Schizoaffective disorder, bipolar type: Secondary | ICD-10-CM | POA: Diagnosis not present

## 2016-03-22 DIAGNOSIS — F250 Schizoaffective disorder, bipolar type: Secondary | ICD-10-CM | POA: Diagnosis not present

## 2016-03-23 DIAGNOSIS — F250 Schizoaffective disorder, bipolar type: Secondary | ICD-10-CM | POA: Diagnosis not present

## 2016-03-24 DIAGNOSIS — F250 Schizoaffective disorder, bipolar type: Secondary | ICD-10-CM | POA: Diagnosis not present

## 2016-03-25 DIAGNOSIS — F250 Schizoaffective disorder, bipolar type: Secondary | ICD-10-CM | POA: Diagnosis not present

## 2016-03-26 DIAGNOSIS — F250 Schizoaffective disorder, bipolar type: Secondary | ICD-10-CM | POA: Diagnosis not present

## 2016-03-29 ENCOUNTER — Encounter (HOSPITAL_COMMUNITY): Payer: Self-pay | Admitting: Family Medicine

## 2016-03-29 ENCOUNTER — Observation Stay (HOSPITAL_COMMUNITY)
Admission: AD | Admit: 2016-03-29 | Discharge: 2016-03-30 | Disposition: A | Discharge status: Home / Self Care | Payer: Medicare Other | Source: Intra-hospital | Attending: Psychiatry | Admitting: Psychiatry

## 2016-03-29 ENCOUNTER — Emergency Department (HOSPITAL_COMMUNITY)
Admission: EM | Admit: 2016-03-29 | Discharge: 2016-03-29 | Disposition: A | Discharge status: Other Acute Inpatient Hospital | Payer: Medicare Other | Attending: Emergency Medicine | Admitting: Emergency Medicine

## 2016-03-29 ENCOUNTER — Encounter (HOSPITAL_COMMUNITY): Payer: Self-pay | Admitting: *Deleted

## 2016-03-29 DIAGNOSIS — N529 Male erectile dysfunction, unspecified: Secondary | ICD-10-CM | POA: Insufficient documentation

## 2016-03-29 DIAGNOSIS — F29 Unspecified psychosis not due to a substance or known physiological condition: Secondary | ICD-10-CM | POA: Diagnosis not present

## 2016-03-29 DIAGNOSIS — Z79899 Other long term (current) drug therapy: Secondary | ICD-10-CM | POA: Diagnosis not present

## 2016-03-29 DIAGNOSIS — R45851 Suicidal ideations: Secondary | ICD-10-CM | POA: Insufficient documentation

## 2016-03-29 DIAGNOSIS — F1020 Alcohol dependence, uncomplicated: Secondary | ICD-10-CM | POA: Diagnosis not present

## 2016-03-29 DIAGNOSIS — E039 Hypothyroidism, unspecified: Secondary | ICD-10-CM | POA: Insufficient documentation

## 2016-03-29 DIAGNOSIS — F17210 Nicotine dependence, cigarettes, uncomplicated: Secondary | ICD-10-CM | POA: Insufficient documentation

## 2016-03-29 DIAGNOSIS — F209 Schizophrenia, unspecified: Secondary | ICD-10-CM | POA: Diagnosis not present

## 2016-03-29 DIAGNOSIS — F1424 Cocaine dependence with cocaine-induced mood disorder: Secondary | ICD-10-CM

## 2016-03-29 DIAGNOSIS — Z85048 Personal history of other malignant neoplasm of rectum, rectosigmoid junction, and anus: Secondary | ICD-10-CM | POA: Insufficient documentation

## 2016-03-29 DIAGNOSIS — T426X2A Poisoning by other antiepileptic and sedative-hypnotic drugs, intentional self-harm, initial encounter: Secondary | ICD-10-CM | POA: Diagnosis not present

## 2016-03-29 DIAGNOSIS — Z21 Asymptomatic human immunodeficiency virus [HIV] infection status: Secondary | ICD-10-CM | POA: Insufficient documentation

## 2016-03-29 DIAGNOSIS — F3130 Bipolar disorder, current episode depressed, mild or moderate severity, unspecified: Secondary | ICD-10-CM | POA: Insufficient documentation

## 2016-03-29 DIAGNOSIS — I10 Essential (primary) hypertension: Secondary | ICD-10-CM | POA: Insufficient documentation

## 2016-03-29 DIAGNOSIS — F1420 Cocaine dependence, uncomplicated: Secondary | ICD-10-CM

## 2016-03-29 DIAGNOSIS — F329 Major depressive disorder, single episode, unspecified: Secondary | ICD-10-CM | POA: Insufficient documentation

## 2016-03-29 DIAGNOSIS — F200 Paranoid schizophrenia: Secondary | ICD-10-CM | POA: Insufficient documentation

## 2016-03-29 DIAGNOSIS — Z91040 Latex allergy status: Secondary | ICD-10-CM | POA: Diagnosis not present

## 2016-03-29 DIAGNOSIS — Z915 Personal history of self-harm: Secondary | ICD-10-CM | POA: Diagnosis not present

## 2016-03-29 DIAGNOSIS — N390 Urinary tract infection, site not specified: Secondary | ICD-10-CM | POA: Diagnosis not present

## 2016-03-29 DIAGNOSIS — Z880 Allergy status to penicillin: Secondary | ICD-10-CM | POA: Insufficient documentation

## 2016-03-29 LAB — CBC WITH DIFFERENTIAL/PLATELET
Basophils Absolute: 0 10*3/uL (ref 0.0–0.1)
Basophils Relative: 0 %
EOS ABS: 0 10*3/uL (ref 0.0–0.7)
EOS PCT: 0 %
HCT: 41.5 % (ref 39.0–52.0)
Hemoglobin: 14.2 g/dL (ref 13.0–17.0)
LYMPHS ABS: 1.6 10*3/uL (ref 0.7–4.0)
Lymphocytes Relative: 28 %
MCH: 33 pg (ref 26.0–34.0)
MCHC: 34.2 g/dL (ref 30.0–36.0)
MCV: 96.5 fL (ref 78.0–100.0)
MONO ABS: 0.6 10*3/uL (ref 0.1–1.0)
Monocytes Relative: 10 %
Neutro Abs: 3.6 10*3/uL (ref 1.7–7.7)
Neutrophils Relative %: 62 %
PLATELETS: 142 10*3/uL — AB (ref 150–400)
RBC: 4.3 MIL/uL (ref 4.22–5.81)
RDW: 12.9 % (ref 11.5–15.5)
WBC: 5.9 10*3/uL (ref 4.0–10.5)

## 2016-03-29 LAB — COMPREHENSIVE METABOLIC PANEL
ALT: 26 U/L (ref 17–63)
ANION GAP: 8 (ref 5–15)
AST: 44 U/L — ABNORMAL HIGH (ref 15–41)
Albumin: 4.3 g/dL (ref 3.5–5.0)
Alkaline Phosphatase: 59 U/L (ref 38–126)
BUN: 25 mg/dL — ABNORMAL HIGH (ref 6–20)
CHLORIDE: 106 mmol/L (ref 101–111)
CO2: 24 mmol/L (ref 22–32)
CREATININE: 0.99 mg/dL (ref 0.61–1.24)
Calcium: 9.8 mg/dL (ref 8.9–10.3)
Glucose, Bld: 82 mg/dL (ref 65–99)
POTASSIUM: 3.9 mmol/L (ref 3.5–5.1)
SODIUM: 138 mmol/L (ref 135–145)
Total Bilirubin: 1 mg/dL (ref 0.3–1.2)
Total Protein: 8 g/dL (ref 6.5–8.1)

## 2016-03-29 LAB — URINALYSIS, ROUTINE W REFLEX MICROSCOPIC
Bilirubin Urine: NEGATIVE
Glucose, UA: NEGATIVE mg/dL
Hgb urine dipstick: NEGATIVE
Ketones, ur: 40 mg/dL — AB
LEUKOCYTES UA: NEGATIVE
NITRITE: POSITIVE — AB
PROTEIN: NEGATIVE mg/dL
SPECIFIC GRAVITY, URINE: 1.021 (ref 1.005–1.030)
pH: 6 (ref 5.0–8.0)

## 2016-03-29 LAB — URINE MICROSCOPIC-ADD ON

## 2016-03-29 LAB — RAPID URINE DRUG SCREEN, HOSP PERFORMED
AMPHETAMINES: NOT DETECTED
BENZODIAZEPINES: NOT DETECTED
Barbiturates: NOT DETECTED
COCAINE: POSITIVE — AB
OPIATES: NOT DETECTED
TETRAHYDROCANNABINOL: NOT DETECTED

## 2016-03-29 LAB — SALICYLATE LEVEL

## 2016-03-29 LAB — ETHANOL

## 2016-03-29 LAB — LIPASE, BLOOD: LIPASE: 18 U/L (ref 11–51)

## 2016-03-29 LAB — ACETAMINOPHEN LEVEL

## 2016-03-29 MED ORDER — ACETAMINOPHEN 325 MG PO TABS
650.0000 mg | ORAL_TABLET | Freq: Four times a day (QID) | ORAL | Status: DC | PRN
  Administered 2016-03-29: 650 mg via ORAL
  Filled 2016-03-29: qty 2

## 2016-03-29 MED ORDER — GABAPENTIN 300 MG PO CAPS: 600.0000 mg | ORAL_CAPSULE | Freq: Every day | ORAL | Status: DC

## 2016-03-29 MED ORDER — ELVITEG-COBIC-EMTRICIT-TENOFAF 150-150-200-10 MG PO TABS
1.0000 | ORAL_TABLET | Freq: Every day | ORAL | Status: DC
  Administered 2016-03-29 – 2016-03-30 (×2): 1 via ORAL
  Filled 2016-03-29 (×4): qty 1

## 2016-03-29 MED ORDER — MIRTAZAPINE 15 MG PO TABS
45.0000 mg | ORAL_TABLET | Freq: Every day | ORAL | Status: DC
  Administered 2016-03-29: 45 mg via ORAL
  Filled 2016-03-29 (×2): qty 3

## 2016-03-29 MED ORDER — MAGNESIUM HYDROXIDE 400 MG/5ML PO SUSP: 30.0000 mL | Freq: Every day | ORAL | Status: DC | PRN

## 2016-03-29 MED ORDER — CEFTRIAXONE SODIUM 1 G IJ SOLR
1.0000 g | Freq: Once | INTRAMUSCULAR | Status: AC
  Administered 2016-03-29: 1 g via INTRAVENOUS
  Filled 2016-03-29: qty 10

## 2016-03-29 MED ORDER — ADULT MULTIVITAMIN W/MINERALS CH
1.0000 | ORAL_TABLET | Freq: Every day | ORAL | Status: DC
  Administered 2016-03-29: 1 via ORAL
  Filled 2016-03-29: qty 1

## 2016-03-29 MED ORDER — HYDROCHLOROTHIAZIDE 25 MG PO TABS
12.5000 mg | ORAL_TABLET | Freq: Every day | ORAL | Status: DC
  Administered 2016-03-29 – 2016-03-30 (×2): 12.5 mg via ORAL
  Filled 2016-03-29 (×2): qty 1

## 2016-03-29 MED ORDER — GABAPENTIN 300 MG PO CAPS
300.0000 mg | ORAL_CAPSULE | Freq: Two times a day (BID) | ORAL | Status: DC
  Administered 2016-03-29 – 2016-03-30 (×2): 300 mg via ORAL
  Filled 2016-03-29 (×2): qty 1

## 2016-03-29 MED ORDER — BENZTROPINE MESYLATE 1 MG PO TABS
1.0000 mg | ORAL_TABLET | Freq: Every day | ORAL | Status: DC
  Administered 2016-03-29: 1 mg via ORAL
  Filled 2016-03-29: qty 1

## 2016-03-29 MED ORDER — SODIUM CHLORIDE 0.9 % IV BOLUS (SEPSIS)
1000.0000 mL | Freq: Once | INTRAVENOUS | Status: AC
  Administered 2016-03-29: 1000 mL via INTRAVENOUS

## 2016-03-29 MED ORDER — CIPROFLOXACIN HCL 500 MG PO TABS
500.0000 mg | ORAL_TABLET | Freq: Two times a day (BID) | ORAL | Status: DC
  Administered 2016-03-29: 500 mg via ORAL
  Filled 2016-03-29: qty 1

## 2016-03-29 MED ORDER — CIPROFLOXACIN HCL 250 MG PO TABS
500.0000 mg | ORAL_TABLET | Freq: Two times a day (BID) | ORAL | Status: DC
  Administered 2016-03-29 – 2016-03-30 (×2): 500 mg via ORAL
  Filled 2016-03-29 (×2): qty 2

## 2016-03-29 MED ORDER — RISPERIDONE 2 MG PO TABS
4.0000 mg | ORAL_TABLET | Freq: Every day | ORAL | Status: DC
  Administered 2016-03-29: 4 mg via ORAL
  Filled 2016-03-29: qty 2

## 2016-03-29 MED ORDER — ALUM & MAG HYDROXIDE-SIMETH 200-200-20 MG/5ML PO SUSP: 30.0000 mL | ORAL | Status: DC | PRN

## 2016-03-29 MED ORDER — BENZTROPINE MESYLATE 0.5 MG PO TABS: 0.5000 mg | ORAL_TABLET | Freq: Every day | ORAL | Status: DC

## 2016-03-29 NOTE — ED Notes (Signed)
Kim, RN asked if we could wait 4min prior to calling transport for pt across the street.

## 2016-03-29 NOTE — ED Notes (Signed)
TSS in room

## 2016-03-29 NOTE — Discharge Instructions (Signed)

## 2016-03-29 NOTE — ED Notes (Signed)
Refused IV at this time

## 2016-03-29 NOTE — H&P (Signed)
[place] OBS UNIT H&P  Reason for Consult: Auditory [name] halucination Referring Physician:  EDP Patient Identification: [name] MRN:  [id number] Principal Diagnosis: Cocaine dependence with cocaine-induced mood disorder [facility]) Diagnosis:   Patient Active Problem List   Diagnosis Date Noted  . Cocaine dependence with cocaine-induced mood disorder ([place]) [F14.24] 03/29/2016    Priority: High  . HTN (hypertension) [I10] 08/25/2015  . Paranoid schizophrenia, chronic condition ([place]) [F20.0] 12/30/2014  . Cocaine use disorder, severe, dependence ([place]) [F14.20] 12/27/2014  . Alcohol use disorder, severe, dependence ([place]) [F10.20] 12/27/2014  . ERECTILE DYSFUNCTION [F52.8] 09/03/2007  . FOOT PAIN, BILATERAL [M79.609] 07/10/2007  . Human immunodeficiency virus (HIV) disease ([place]) [B20] 11/09/2006  . VIRAL MENINGITIS [A87.9] 11/09/2006  . THRUSH [B37.0] 11/09/2006  . 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 HYPOTHYROIDISM [E03.9] 11/09/2006  . DISORDER, BIPOLAR NOS [F31.9] 11/09/2006  . PYELONEPHRITIS [N12] 11/09/2006    Total Time spent with patient: 25 minutes  Subjective:   [name] Herring is a 55 y.o. male patient admitted with reports of suicidal ideation and psychosis along with polysubstance abuse. Pt seen and chart reviewed. Pt is alert/oriented x4, calm, cooperative, and appropriate to situation. Pt denies suicidal/homicidal ideation and psychosis and does not appear to be responding to internal stimuli. Pt reports that he has been feeling suicidal earlier today and that housing is his primary concern.   HPI:  [name] Herring is an 55 y.o. male presenting to [facility] after an intentional overdose. Pt reported that he wanted to commit suicide because he has been HIV positive since 1999 and drug addiction. Pt reported that he abuses crack/cocaine as well as alcohol. Pt reported that he has attempted suicide multiple times in the past but did not report any self-injurious behaviors. Pt  shared that he is currently receiving mental health treatment through [facility] and shared that he has had multiple psychiatric admissions. Pt denied having access to weapons and did not report any upcoming court dates or pending criminal charges. Pt is reporting multiple depressive symptoms and shard that his sleep and appetite is poor.  Pt would be appropriate for the observation.    Past Medical History:  Past Medical History  Diagnosis Date  . Hypertension   . HIV (human immunodeficiency virus infection) ([place])   . Schizophrenia ([place])   . Depression   . Immune deficiency disorder ([place])   . Personality disorder    History reviewed. No pertinent past surgical history. Family History:  Family History  Problem Relation Age of Onset  . CAD Mother   . Kidney disease Sister   . Lupus Brother   . Cancer Maternal Grandmother   . Stroke Paternal Grandmother    Social History:  History  Alcohol Use  . 0.0 oz/week  . 0 Standard drinks or equivalent per week    Comment: Daily. Last used: unknown time and amount     History  Drug Use  . Yes  . Special: Cocaine    Comment: Last used: yesterday    Social History   Social History  . Marital Status: Divorced    Spouse Name: N/A  . Number of Children: N/A  . Years of Education: N/A   Social History Main Topics  . Smoking status: Current Every Day Smoker -- 1.00 packs/day    Types: Cigarettes  . Smokeless tobacco: None  . Alcohol Use: 0.0 oz/week    0 Standard drinks or equivalent per week     Comment: Daily. Last used:  unknown time and amount  . Drug Use: Yes    Special: Cocaine     Comment: Last used: yesterday  . Sexual Activity: No     Comment: given condoms   Other Topics Concern  . None   Social History Narrative   Additional Social History:    Pain Medications: None Prescriptions: see pta list Over the Counter: N/A History of alcohol / drug use?: Yes Longest period of sobriety (when/how long): "8 years" Negative  Consequences of Use: Financial, Personal relationships Withdrawal Symptoms: Other (Comment) (none) Name of Substance 1: Crack/Cocaine  1 - Age of First Use: late 69's  1 - Frequency: daily 1 - Duration: 10 yrs 1 - Last Use / Amount: 03-28-16 Name of Substance 2: Alcohol  2 - Age of First Use: 22 2 - Amount (size/oz): 2 beer  2 - Frequency: monthly  2 - Duration: ongoing  2 - Last Use / Amount: 03-28-16                 Allergies:   Allergies  Allergen Reactions  . Amoxicillin Shortness Of Breath and Rash    Has patient had a PCN reaction causing immediate rash, facial/tongue/throat swelling, [name] Has patient had a PCN reaction causing severe rash involving mucus membranes or skin necrosis: no Has patient had a PCN reaction that required hospitalization: yes drs office visit Has patient had a PCN reaction occurring within the last 10 years: no If all of the above answers are "NO", then may proceed with Cephalosporin use.   . Latex Shortness Of Breath    Labs:  Results for orders placed or performed during the hospital encounter of 03/29/16 (from the past 48 hour(s))  CBC with Differential/Platelet     Status: Abnormal   Collection Time: 03/29/16  1:23 AM  Result Value Ref Range   WBC 5.9 4.0 - 10.5 K/uL   RBC 4.30 4.22 - 5.81 MIL/uL   Hemoglobin 14.2 13.0 - 17.0 g/dL   HCT 41.5 39.0 - 52.0 %   MCV 96.5 78.0 - 100.0 fL   MCH 33.0 26.0 - 34.0 pg   MCHC 34.2 30.0 - 36.0 g/dL   RDW 12.9 11.5 - 15.5 %   Platelets 142 (L) 150 - 400 K/uL   Neutrophils Relative % 62 %   Neutro Abs 3.6 1.7 - 7.7 K/uL   Lymphocytes Relative 28 %   Lymphs Abs 1.6 0.7 - 4.0 K/uL   Monocytes Relative 10 %   Monocytes Absolute 0.6 0.1 - 1.0 K/uL   Eosinophils Relative 0 %   Eosinophils Absolute 0.0 0.0 - 0.7 K/uL   Basophils Relative 0 %   Basophils Absolute 0.0 0.0 - 0.1 K/uL  Comprehensive metabolic panel     Status: Abnormal   Collection Time: 03/29/16   1:23 AM  Result Value Ref Range   Sodium 138 135 - 145 mmol/L   Potassium 3.9 3.5 - 5.1 mmol/L   Chloride 106 101 - 111 mmol/L   CO2 24 22 - 32 mmol/L   Glucose, Bld 82 65 - 99 mg/dL   BUN 25 (H) 6 - 20 mg/dL   Creatinine, Ser 0.99 0.61 - 1.24 mg/dL   Calcium 9.8 8.9 - 10.3 mg/dL   Total Protein 8.0 6.5 - 8.1 g/dL   Albumin 4.3 3.5 - 5.0 g/dL   AST 44 (H) 15 - 41 U/L   ALT 26 17 - 63 U/L   Alkaline Phosphatase 59  38 - 126 U/L   Total Bilirubin 1.0 0.3 - 1.2 mg/dL   GFR calc non Af Amer >60 >60 mL/min   GFR calc Af Amer >60 >60 mL/min    Comment: (NOTE) The eGFR has been calculated using the CKD EPI equation. This calculation has not been validated in all clinical situations. eGFR's persistently <60 mL/min signify possible Chronic Kidney Disease.    Anion gap 8 5 - 15  Lipase, blood     Status: None   Collection Time: 03/29/16  1:23 AM  Result Value Ref Range   Lipase 18 11 - 51 U/L  Ethanol     Status: None   Collection Time: 03/29/16  1:23 AM  Result Value Ref Range   Alcohol, Ethyl (B) <5 <5 mg/dL    Comment:        LOWEST DETECTABLE LIMIT FOR SERUM ALCOHOL IS 5 mg/dL FOR MEDICAL PURPOSES ONLY   Acetaminophen level     Status: Abnormal   Collection Time: 03/29/16  1:23 AM  Result Value Ref Range   Acetaminophen (Tylenol), Serum <10 (L) 10 - 30 ug/mL    Comment:        THERAPEUTIC CONCENTRATIONS VARY SIGNIFICANTLY. A RANGE OF 10-30 ug/mL MAY BE AN EFFECTIVE CONCENTRATION FOR MANY PATIENTS. HOWEVER, SOME ARE BEST TREATED AT CONCENTRATIONS OUTSIDE THIS RANGE. ACETAMINOPHEN CONCENTRATIONS >150 ug/mL AT 4 HOURS AFTER INGESTION AND >50 ug/mL AT 12 HOURS AFTER INGESTION ARE OFTEN ASSOCIATED WITH TOXIC REACTIONS.   Salicylate level     Status: None   Collection Time: 03/29/16  1:23 AM  Result Value Ref Range   Salicylate Lvl <0.9 2.8 - 30.0 mg/dL  Urinalysis, Routine w reflex microscopic (not at [facility])     Status: Abnormal   Collection Time: 03/29/16  2:05 AM   Result Value Ref Range   Color, Urine YELLOW YELLOW   APPearance CLEAR CLEAR   Specific Gravity, Urine 1.021 1.005 - 1.030   pH 6.0 5.0 - 8.0   Glucose, UA NEGATIVE NEGATIVE mg/dL   Hgb urine dipstick NEGATIVE NEGATIVE   Bilirubin Urine NEGATIVE NEGATIVE   Ketones, ur 40 (A) NEGATIVE mg/dL   Protein, ur NEGATIVE NEGATIVE mg/dL   Nitrite POSITIVE (A) NEGATIVE   Leukocytes, UA NEGATIVE NEGATIVE  Urine rapid drug screen (hosp performed)     Status: Abnormal   Collection Time: 03/29/16  2:05 AM  Result Value Ref Range   Opiates NONE DETECTED NONE DETECTED   Cocaine POSITIVE (A) NONE DETECTED   Benzodiazepines NONE DETECTED NONE DETECTED   Amphetamines NONE DETECTED NONE DETECTED   Tetrahydrocannabinol NONE DETECTED NONE DETECTED   Barbiturates NONE DETECTED NONE DETECTED    Comment:        DRUG SCREEN FOR MEDICAL PURPOSES ONLY.  IF CONFIRMATION IS NEEDED FOR ANY PURPOSE, NOTIFY LAB WITHIN 5 DAYS.        LOWEST DETECTABLE LIMITS FOR URINE DRUG SCREEN Drug Class       Cutoff (ng/mL) Amphetamine      1000 Barbiturate      200 Benzodiazepine   470 Tricyclics       962 Opiates          300 Cocaine          300 THC              50   Urine microscopic-add on     Status: Abnormal   Collection Time: 03/29/16  2:05 AM  Result Value Ref Range   Squamous  Epithelial / LPF 0-5 (A) NONE SEEN   WBC, UA 6-30 0 - 5 WBC/hpf   RBC / HPF 0-5 0 - 5 RBC/hpf   Bacteria, UA MANY (A) NONE SEEN   Sperm, UA PRESENT     Vitals: Blood pressure 143/89, pulse 75, temperature 97.7 F (36.5 C), temperature source Oral, resp. rate 18, height _0  (1.778 m), weight 68.04 kg (150 lb).  Risk to Self: Is patient at risk for suicide?: Yes Risk to Others:   Prior Inpatient Therapy:   Prior Outpatient Therapy:    Current Facility-Administered Medications  Medication Dose Route Frequency Provider Last Rate Last Dose  . acetaminophen (TYLENOL) tablet 650 mg  650 mg Oral Q6H PRN Patrecia Pour, NP       . alum & mag hydroxide-simeth (MAALOX/MYLANTA) 200-200-20 MG/5ML suspension 30 mL  30 mL Oral Q4H PRN Patrecia Pour, NP      . ciprofloxacin (CIPRO) tablet 500 mg  500 mg Oral BID Patrecia Pour, NP      . elvitegravir-cobicistat-emtricitabine-tenofovir (GENVOYA) 150-150-200-10 MG tablet 1 tablet  1 tablet Oral Q breakfast Hampton Abbot, MD      . magnesium hydroxide (MILK OF MAGNESIA) suspension 30 mL  30 mL Oral Daily PRN Patrecia Pour, NP        Musculoskeletal: Strength & Muscle Tone: seen in bed during this assessment Gait & Station: assessed while in bed Patient leans: see above  Psychiatric Specialty Exam:     Blood pressure 143/89, pulse 75, temperature 97.7 F (36.5 C), temperature source Oral, resp. rate 18, height _1  (1.778 m), weight 68.04 kg (150 lb).Body mass index is 21.52 kg/(m^2).  General Appearance: Casual and Disheveled  Eye Contact::  Minimal  Speech:  Clear and Coherent and Normal Rate  Volume:  Normal  Mood:  Depressed  Affect:  Congruent, Depressed and Flat  Thought Process:  Coherent, Goal Directed and Intact  Orientation:  Full (Time, Place, and Person)  Thought Content:  symptoms, worries, concerns.   Suicidal Thoughts:  No  Homicidal Thoughts:  No  Memory:  Immediate;   Good Recent;   Good Remote;   Good  Judgement:  Good  Insight:  Good  Psychomotor Activity:  Normal  Concentration:  Good  Recall:  NA  Fund of Knowledge:Good  Language: Good  Akathisia:  NA  Handed:  Right  AIMS (if indicated):     Assets:  Desire for Improvement  ADL's:  Impaired  Cognition: WNL  Sleep:      Treatment Plan Summary: Cocaine dependence with cocaine-induced mood disorder ([place]), unstable, warrants overnight observation at [facility].   Medications: Genvoya for HIV -Continue Neurontin 3985m po bid for mood stabilization -continue hydrodiuril 12.52mpo daily for HTN  -continue cogentin 85m55mo qhs for EPS  -continue remeron 33m63m qhs for  insomnia/depression -continue risperidone 4mg 63mqhs for psychosis  Disposition: Spend the night in the [facility] and stabilization.   WithrBenjamine Mola-BC 03/29/2016 5:14 PM  Agree with NP note and assessment as above

## 2016-03-29 NOTE — Progress Notes (Signed)
Patient in bed resting at the beginning of the shift. Mood and affect sad and depressed. He endorsed SI and stated;" I'm not going to talk about plans".  He verbally agreed to inform staff if the  The thoughts is getting worse or obsessive. He complaint of pain on his right hand and rated his pain on 10 on the scale of 1-10 with 10 the worst. Writer encouraged and supported patient. Q 15 minute check continues as ordered for safety.

## 2016-03-29 NOTE — BH Assessment (Signed)
Unable to complete assessment at this time. Pt is drowsy and only responds to his name. Will assess when pt is awake. Informed Dr. Claudine Mouton.

## 2016-03-29 NOTE — [facility] Counselor (Signed)
This Probation officer spoke with pt in reference to his reason for admission. Pt explained that he has been severely depressed due to his HIV diagnosis. Pt also shared that he has an ACTT team but couldn't remember if it was through [facility] or [facility]? This Probation officer will assist pt with finding inpatient psychiatric placement, which was the recommendation from the [facility] [employer]).   Redmond Pulling, MA OBS Counselor, MA

## 2016-03-29 NOTE — Progress Notes (Signed)
Patient ID: [name] Herring, male   DOB: [birth year], 55 y.o.   MRN: [id number] CHAISON GUILLORY is a 55 y.o. maleadmitted to observation after an intentional overdose. Pt reported that he wanted to commit suicide because he has been HIV positive since 1999 and drug addiction. Pt reported that he abuses crack/cocaine as well as alcohol. Pt reported that he has attempted suicide multiple times in the past. Patient states he has a current plan but refused to share. Verbally contracts for safety. Stated he hears voices and sees people without faces. Stated the people told him to kill others.  Vitals stable. Food provided. Notified Heloise Purpura of arrival.

## 2016-03-29 NOTE — Progress Notes (Signed)
Patient ID: [name] Herring, male   DOB: 1960-10-22, 55 y.o.   MRN: [id number] PER STATE REGULATIONS 482.30  THIS CHART WAS REVIEWED FOR MEDICAL NECESSITY WITH RESPECT TO THE PATIENT'S ADMISSION/DURATION OF STAY.  NEXT REVIEW DATE:04/02/16 Roma Schanz, RN, BSN CASE MANAGER

## 2016-03-29 NOTE — BH Assessment (Signed)
[place] Assessment Progress Note  Per Corena Pilgrim, MD, this pt would benefit from admission to the [facility] Observation Unit at this time.  Letitia Libra, RN, [facility] has assigned pt to Obs 1.  Pt has signed Voluntary Admission and Consent for Treatment, as well as Consent to Release Information to no one, and signed forms have been faxed to [facility].  Pt's nurse has been notified, and agrees to send original paperwork along with pt via [facility], and to call report to [phone] or [phone].  Jalene Mullet, [place] Triage Specialist [phone]
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
## 2016-03-29 NOTE — BH Assessment (Signed)
Assessment completed. Consulted Priscille Loveless, NP who recommended the  observation unit. PA-C has been informed of the recommendation.

## 2016-03-29 NOTE — ED Provider Notes (Signed)
CSN: [id number]     Arrival date & time 03/29/16  0103 History  By signing my name below, I, Higinio Plan, attest that this documentation has been prepared under the direction and in the presence of Everlene Balls, MD . Electronically Signed: Higinio Plan, Scribe. 03/29/2016. 2:24 AM.   Chief Complaint  Patient presents with  . Suicide Attempt   Level V Caveat due to pt nonverbal   The history is provided by the EMS personnel. No language interpreter was used.   HPI Comments: [name] Herring is a 55 y.o. male with PMHx of HTN, schizophrenia, personality disorder, depression, and HIV, who presents to the Emergency Department by [facility] with a complaint of suicide attempt that occurred ~2 hours PTA. Per EMS, pt took 600mg  neurontin ~ 2 hours ago. EMS also reports pt smoked crack cocaine ~4 hours ago and drank an unknown amount of beers. Per EMS, pt complains of gradually worsening, LLQ abdominal pain. Per EMS, pt has been having SI and generalized, chronic pain throughout his body since his diagnosis of HIV in 1999.   Past Medical History  Diagnosis Date  . Hypertension   . HIV (human immunodeficiency virus infection) ([place])   . Schizophrenia ([place])   . Depression   . Immune deficiency disorder ([place])   . Personality disorder    No past surgical history on file. Family History  Problem Relation Age of Onset  . CAD Mother   . Kidney disease Sister   . Lupus Brother   . Cancer Maternal Grandmother   . Stroke Paternal Grandmother    Social History  Substance Use Topics  . Smoking status: Current Every Day Smoker -- 1.00 packs/day    Types: Cigarettes  . Smokeless tobacco: Not on file  . Alcohol Use: 0.0 oz/week    0 Standard drinks or equivalent per week     Comment: Daily     Review of Systems  Unable to perform ROS: Patient nonverbal   Allergies  Latex and Amoxicillin  Home Medications   Prior to Admission medications   Medication Sig Start Date End Date Taking? Authorizing Provider   benztropine (COGENTIN) 0.5 MG tablet Take 1 tablet (0.5 mg total) by mouth daily at 8 pm. For drug induced extrapyramidal reaction Patient not taking: Reported on 01/26/2016 12/30/14   Shuvon B Rankin, NP  gabapentin (NEURONTIN) 300 MG capsule Take 2 capsules (600 mg total) by mouth daily at 8 pm. For agitation and alcohol withdrawal syndrome Patient not taking: Reported on 01/26/2016 12/30/14   Shuvon B Rankin, NP  GENVOYA 150-150-200-10 MG TABS tablet TAKE 1 TABLET BY MOUTH DAILY WITH BREAKFAST.*MUST KEEP APPT FOR FUTURE REFILLS* 02/25/16   Campbell Riches, MD  hydrochlorothiazide (HYDRODIURIL) 25 MG tablet Take 0.5 tablets (12.5 mg total) by mouth daily. For high blood pressure Patient not taking: Reported on 01/26/2016 12/30/14   Shuvon B Rankin, NP  mirtazapine (REMERON) 45 MG tablet Take 1 tablet (45 mg total) by mouth daily at 8 pm. For depression and sleep Patient not taking: Reported on 01/26/2016 12/30/14   Shuvon B Rankin, NP  Multiple Vitamin (MULTIVITAMIN WITH MINERALS) TABS tablet Take 1 tablet by mouth daily. Patient not taking: Reported on 01/26/2016 09/26/14   Patrecia Pour, NP  risperiDONE (RISPERDAL) 4 MG tablet Take 1 tablet (4 mg total) by mouth daily at 8 pm. For schizophrenia Patient not taking: Reported on 01/26/2016 12/30/14   Shuvon B Rankin, NP   There were no vitals taken for  this visit. Physical Exam  Constitutional:  Drowsy but easily arousable  HENT:  Head: Normocephalic and atraumatic.  Eyes: Conjunctivae are normal. Pupils are equal, round, and reactive to light. Right eye exhibits no discharge. Left eye exhibits no discharge. No scleral icterus.  Neck: Normal range of motion. No JVD present. No tracheal deviation present.  Pulmonary/Chest: Effort normal. No stridor.  Abdominal: There is tenderness.  Neurological: Coordination normal.  Moves all extremities and follows all commands  Nursing note and vitals reviewed.   ED Course  Procedures  DIAGNOSTIC STUDIES:  Oxygen  Saturation is 91% on RA, normal by my interpretation.    COORDINATION OF CARE:  2:25 AM Discussed treatment plan with pt at bedside and pt agreed to plan.  Labs Review Labs Reviewed  CBC WITH DIFFERENTIAL/PLATELET - Abnormal; Notable for the following:    Platelets 142 (*)    All other components within normal limits  COMPREHENSIVE METABOLIC PANEL - Abnormal; Notable for the following:    BUN 25 (*)    AST 44 (*)    All other components within normal limits  URINALYSIS, ROUTINE W REFLEX MICROSCOPIC (NOT AT [facility]) - Abnormal; Notable for the following:    Ketones, ur 40 (*)    Nitrite POSITIVE (*)    All other components within normal limits  URINE RAPID DRUG SCREEN, HOSP PERFORMED - Abnormal; Notable for the following:    Cocaine POSITIVE (*)    All other components within normal limits  ACETAMINOPHEN LEVEL - Abnormal; Notable for the following:    Acetaminophen (Tylenol), Serum <10 (*)    All other components within normal limits  URINE MICROSCOPIC-ADD ON - Abnormal; Notable for the following:    Squamous Epithelial / LPF 0-5 (*)    Bacteria, UA MANY (*)    All other components within normal limits  LIPASE, BLOOD  ETHANOL  SALICYLATE LEVEL    Imaging Review No results found. I have personally reviewed and evaluated these images and lab results as part of my medical decision-making.   EKG Interpretation   Date/Time:  Wednesday March 29 2016 01:23:24 EDT Ventricular Rate:  70 PR Interval:    QRS Duration: 101 QT Interval:  391 QTC Calculation: [id number] R Axis:   37 Text Interpretation:  Sinus rhythm Consider left atrial enlargement Left  ventricular hypertrophy No significant change since last tracing Confirmed  by Glynn Octave [phone]) on 03/29/2016 1:54:59 AM      MDM   Final diagnoses:  None   Patient presents to the ED for SI.  He was found to have UTI on routine labs.  He was given IVF and ceftriaxone for treatment.  TTS evaluated the patient and rec for  inpatient placement.  He will need to continue keflex for one week for treatment.    I personally performed the services described in this documentation, which was scribed in my presence. The recorded information has been reviewed and is accurate.      Everlene Balls, MD 03/29/16 [phone]

## 2016-03-29 NOTE — ED Notes (Signed)
Patient [place] from [place] and transported via [facility]. Pt smokes crack 6 hours ago, has had unknown amount of beers, and attempted suicide 2 hour ago. He took 600mg  of Neurontin 2 hours ago. Pt reports he has been suicidal since 1999. Pt is drowsy on arrival.

## 2016-03-29 NOTE — ED Notes (Signed)
Bed: [id number] Expected date:  Expected time:  Means of arrival:  Comments: EMS SI

## 2016-03-30 DIAGNOSIS — F1424 Cocaine dependence with cocaine-induced mood disorder: Secondary | ICD-10-CM | POA: Diagnosis not present

## 2016-03-30 DIAGNOSIS — T426X2A Poisoning by other antiepileptic and sedative-hypnotic drugs, intentional self-harm, initial encounter: Secondary | ICD-10-CM | POA: Diagnosis not present

## 2016-03-30 MED ORDER — ADULT MULTIVITAMIN W/MINERALS CH
1.0000 | ORAL_TABLET | Freq: Every morning | ORAL | Status: DC
  Administered 2016-03-30: 1 via ORAL
  Filled 2016-03-30: qty 1

## 2016-03-30 MED ORDER — CIPROFLOXACIN HCL 500 MG PO TABS: 500.0000 mg | ORAL_TABLET | Freq: Two times a day (BID) | ORAL | Status: DC

## 2016-03-30 MED ORDER — GABAPENTIN 300 MG PO CAPS: 300.0000 mg | ORAL_CAPSULE | Freq: Two times a day (BID) | ORAL | Status: DC

## 2016-03-30 MED ORDER — HYDROCHLOROTHIAZIDE 25 MG PO TABS: 12.5000 mg | ORAL_TABLET | Freq: Every day | ORAL | Status: DC

## 2016-03-30 MED ORDER — RISPERIDONE 4 MG PO TABS: 4.0000 mg | ORAL_TABLET | Freq: Every day | ORAL | Status: DC

## 2016-03-30 MED ORDER — MIRTAZAPINE 45 MG PO TABS: 45.0000 mg | ORAL_TABLET | Freq: Every day | ORAL | Status: DC

## 2016-03-30 MED ORDER — BENZTROPINE MESYLATE 1 MG PO TABS: 1.0000 mg | ORAL_TABLET | Freq: Every day | ORAL | Status: DC

## 2016-03-30 MED ORDER — ELVITEG-COBIC-EMTRICIT-TENOFAF 150-150-200-10 MG PO TABS: 1.0000 | ORAL_TABLET | Freq: Every day | ORAL | Status: DC

## 2016-03-30 NOTE — Progress Notes (Signed)
Pt d/c from the hospital with a bus pass. All items returned. D/C instructions given and prescriptions given.

## 2016-03-30 NOTE — Discharge Summary (Signed)
[facility] OBS UNIT DISCHARGE SUMMARY  Patient Identification: [name] Herring MRN:  [id number] Principal Diagnosis: Cocaine dependence with cocaine-induced mood disorder [facility]) Diagnosis:   Patient Active Problem List   Diagnosis Date Noted  . Cocaine dependence with cocaine-induced mood disorder ([place]) [F14.24] 03/29/2016    Priority: High  . HTN (hypertension) [I10] 08/25/2015  . Paranoid schizophrenia, chronic condition ([place]) [F20.0] 12/30/2014  . Cocaine use disorder, severe, dependence ([place]) [F14.20] 12/27/2014  . Alcohol use disorder, severe, dependence ([place]) [F10.20] 12/27/2014  . ERECTILE DYSFUNCTION [F52.8] 09/03/2007  . FOOT PAIN, BILATERAL [M79.609] 07/10/2007  . Human immunodeficiency virus (HIV) disease ([place]) [B20] 11/09/2006  . VIRAL MENINGITIS [A87.9] 11/09/2006  . THRUSH [B37.0] 11/09/2006  . 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 HYPOTHYROIDISM [E03.9] 11/09/2006  . DISORDER, BIPOLAR NOS [F31.9] 11/09/2006  . PYELONEPHRITIS [N12] 11/09/2006    Total Time spent with patient: 25 minutes  Subjective:   [name] Herring spent the night in the [place] without incident. Pt now denies suicidal ideation and can collect for safety. Pt reports that his primarily concern is housing at this point but he would also like help with rehab long-term although this may or may not be a request associated with secondary gain as pt minimizes his drug abuse habits and has had no withdrawal concerns. [facility] SW is coordinating with patient for bus pass to get to a shelter, [facility] center, and appointments for psychiatry/counseling services.   HPI: I have reviewed and concur with HPI elements below, modified as follows: [name] Herring is an 55 y.o. male presenting to [facility] after an intentional overdose. Pt reported that he wanted to commit suicide because he has been HIV positive since 1999 and drug addiction. Pt reported that he abuses crack/cocaine as well as alcohol. Pt reported that he has attempted  suicide multiple times in the past but did not report any self-injurious behaviors. Pt shared that he is currently receiving mental health treatment through [place] [facility] and shared that he has had multiple psychiatric admissions. Pt denied having access to weapons and did not report any upcoming court dates or pending criminal charges. Pt is reporting multiple depressive symptoms and shard that his sleep and appetite is poor.   Pt spent the night in the [place] without incident. Pt would like to discharge with outpatient resources.   Past Medical History:  Past Medical History  Diagnosis Date  . Hypertension   . HIV (human immunodeficiency virus infection) ([place])   . Schizophrenia ([place])   . Depression   . Immune deficiency disorder ([place])   . Personality disorder    History reviewed. No pertinent past surgical history. Family History:  Family History  Problem Relation Age of Onset  . CAD Mother   . Kidney disease Sister   . Lupus Brother   . Cancer Maternal Grandmother   . Stroke Paternal Grandmother    Social History:  History  Alcohol Use  . 0.0 oz/week  . 0 Standard drinks or equivalent per week    Comment: Daily. Last used: unknown time and amount     History  Drug Use  . Yes  . Special: Cocaine    Comment: Last used: yesterday    Social History   Social History  . Marital Status: Divorced    Spouse Name: N/A  . Number of Children: N/A  . Years of Education: N/A   Social History Main Topics  . Smoking status: Current Every Day Smoker -- 1.00  packs/day    Types: Cigarettes  . Smokeless tobacco: None  . Alcohol Use: 0.0 oz/week    0 Standard drinks or equivalent per week     Comment: Daily. Last used: unknown time and amount  . Drug Use: Yes    Special: Cocaine     Comment: Last used: yesterday  . Sexual Activity: No     Comment: given condoms   Other Topics Concern  . None   Social History Narrative   Additional Social History:    Pain Medications:  None Prescriptions: see pta list Over the Counter: N/A History of alcohol / drug use?: Yes Longest period of sobriety (when/how long): "8 years" Negative Consequences of Use: Financial, Personal relationships Withdrawal Symptoms: Other (Comment) (none) Name of Substance 1: Crack/Cocaine  1 - Age of First Use: late 72's  1 - Frequency: daily 1 - Duration: 10 yrs 1 - Last Use / Amount: 03-28-16 Name of Substance 2: Alcohol  2 - Age of First Use: 22 2 - Amount (size/oz): 2 beer  2 - Frequency: monthly  2 - Duration: ongoing  2 - Last Use / Amount: 03-28-16                 Allergies:   Allergies  Allergen Reactions  . Amoxicillin Shortness Of Breath and Rash    Has patient had a PCN reaction causing immediate rash, facial/tongue/throat swelling, [name] Has patient had a PCN reaction causing severe rash involving mucus membranes or skin necrosis: no Has patient had a PCN reaction that required hospitalization: yes drs office visit Has patient had a PCN reaction occurring within the last 10 years: no If all of the above answers are "NO", then may proceed with Cephalosporin use.   . Latex Shortness Of Breath    Labs:  Results for orders placed or performed during the hospital encounter of 03/29/16 (from the past 48 hour(s))  CBC with Differential/Platelet     Status: Abnormal   Collection Time: 03/29/16  1:23 AM  Result Value Ref Range   WBC 5.9 4.0 - 10.5 K/uL   RBC 4.30 4.22 - 5.81 MIL/uL   Hemoglobin 14.2 13.0 - 17.0 g/dL   HCT 41.5 39.0 - 52.0 %   MCV 96.5 78.0 - 100.0 fL   MCH 33.0 26.0 - 34.0 pg   MCHC 34.2 30.0 - 36.0 g/dL   RDW 12.9 11.5 - 15.5 %   Platelets 142 (L) 150 - 400 K/uL   Neutrophils Relative % 62 %   Neutro Abs 3.6 1.7 - 7.7 K/uL   Lymphocytes Relative 28 %   Lymphs Abs 1.6 0.7 - 4.0 K/uL   Monocytes Relative 10 %   Monocytes Absolute 0.6 0.1 - 1.0 K/uL   Eosinophils Relative 0 %   Eosinophils Absolute 0.0 0.0  - 0.7 K/uL   Basophils Relative 0 %   Basophils Absolute 0.0 0.0 - 0.1 K/uL  Comprehensive metabolic panel     Status: Abnormal   Collection Time: 03/29/16  1:23 AM  Result Value Ref Range   Sodium 138 135 - 145 mmol/L   Potassium 3.9 3.5 - 5.1 mmol/L   Chloride 106 101 - 111 mmol/L   CO2 24 22 - 32 mmol/L   Glucose, Bld 82 65 - 99 mg/dL   BUN 25 (H) 6 - 20 mg/dL   Creatinine, Ser 0.99 0.61 - 1.24 mg/dL   Calcium 9.8 8.9 - 10.3 mg/dL   Total Protein  8.0 6.5 - 8.1 g/dL   Albumin 4.3 3.5 - 5.0 g/dL   AST 44 (H) 15 - 41 U/L   ALT 26 17 - 63 U/L   Alkaline Phosphatase 59 38 - 126 U/L   Total Bilirubin 1.0 0.3 - 1.2 mg/dL   GFR calc non Af Amer >60 >60 mL/min   GFR calc Af Amer >60 >60 mL/min    Comment: (NOTE) The eGFR has been calculated using the CKD EPI equation. This calculation has not been validated in all clinical situations. eGFR's persistently <60 mL/min signify possible Chronic Kidney Disease.    Anion gap 8 5 - 15  Lipase, blood     Status: None   Collection Time: 03/29/16  1:23 AM  Result Value Ref Range   Lipase 18 11 - 51 U/L  Ethanol     Status: None   Collection Time: 03/29/16  1:23 AM  Result Value Ref Range   Alcohol, Ethyl (B) <5 <5 mg/dL    Comment:        LOWEST DETECTABLE LIMIT FOR SERUM ALCOHOL IS 5 mg/dL FOR MEDICAL PURPOSES ONLY   Acetaminophen level     Status: Abnormal   Collection Time: 03/29/16  1:23 AM  Result Value Ref Range   Acetaminophen (Tylenol), Serum <10 (L) 10 - 30 ug/mL    Comment:        THERAPEUTIC CONCENTRATIONS VARY SIGNIFICANTLY. A RANGE OF 10-30 ug/mL MAY BE AN EFFECTIVE CONCENTRATION FOR MANY PATIENTS. HOWEVER, SOME ARE BEST TREATED AT CONCENTRATIONS OUTSIDE THIS RANGE. ACETAMINOPHEN CONCENTRATIONS >150 ug/mL AT 4 HOURS AFTER INGESTION AND >50 ug/mL AT 12 HOURS AFTER INGESTION ARE OFTEN ASSOCIATED WITH TOXIC REACTIONS.   Salicylate level     Status: None   Collection Time: 03/29/16  1:23 AM  Result Value Ref  Range   Salicylate Lvl <5.2 2.8 - 30.0 mg/dL  Urinalysis, Routine w reflex microscopic (not at [facility])     Status: Abnormal   Collection Time: 03/29/16  2:05 AM  Result Value Ref Range   Color, Urine YELLOW YELLOW   APPearance CLEAR CLEAR   Specific Gravity, Urine 1.021 1.005 - 1.030   pH 6.0 5.0 - 8.0   Glucose, UA NEGATIVE NEGATIVE mg/dL   Hgb urine dipstick NEGATIVE NEGATIVE   Bilirubin Urine NEGATIVE NEGATIVE   Ketones, ur 40 (A) NEGATIVE mg/dL   Protein, ur NEGATIVE NEGATIVE mg/dL   Nitrite POSITIVE (A) NEGATIVE   Leukocytes, UA NEGATIVE NEGATIVE  Urine rapid drug screen (hosp performed)     Status: Abnormal   Collection Time: 03/29/16  2:05 AM  Result Value Ref Range   Opiates NONE DETECTED NONE DETECTED   Cocaine POSITIVE (A) NONE DETECTED   Benzodiazepines NONE DETECTED NONE DETECTED   Amphetamines NONE DETECTED NONE DETECTED   Tetrahydrocannabinol NONE DETECTED NONE DETECTED   Barbiturates NONE DETECTED NONE DETECTED    Comment:        DRUG SCREEN FOR MEDICAL PURPOSES ONLY.  IF CONFIRMATION IS NEEDED FOR ANY PURPOSE, NOTIFY LAB WITHIN 5 DAYS.        LOWEST DETECTABLE LIMITS FOR URINE DRUG SCREEN Drug Class       Cutoff (ng/mL) Amphetamine      1000 Barbiturate      200 Benzodiazepine   080 Tricyclics       223 Opiates          300 Cocaine          300 THC  50   Urine microscopic-add on     Status: Abnormal   Collection Time: 03/29/16  2:05 AM  Result Value Ref Range   Squamous Epithelial / LPF 0-5 (A) NONE SEEN   WBC, UA 6-30 0 - 5 WBC/hpf   RBC / HPF 0-5 0 - 5 RBC/hpf   Bacteria, UA MANY (A) NONE SEEN   Sperm, UA PRESENT     Vitals: Blood pressure 143/89, pulse 75, temperature 97.7 F (36.5 C), temperature source Oral, resp. rate 18, height '5\' 10"'  (1.778 m), weight 68.04 kg (150 lb).  Risk to Self: Is patient at risk for suicide?: Yes Risk to Others:   Prior Inpatient Therapy:   Prior Outpatient Therapy:    Current Facility-Administered  Medications  Medication Dose Route Frequency Provider Last Rate Last Dose  . acetaminophen (TYLENOL) tablet 650 mg  650 mg Oral Q6H PRN Patrecia Pour, NP      . alum & mag hydroxide-simeth (MAALOX/MYLANTA) 200-200-20 MG/5ML suspension 30 mL  30 mL Oral Q4H PRN Patrecia Pour, NP      . ciprofloxacin (CIPRO) tablet 500 mg  500 mg Oral BID Patrecia Pour, NP      . elvitegravir-cobicistat-emtricitabine-tenofovir (GENVOYA) 150-150-200-10 MG tablet 1 tablet  1 tablet Oral Q breakfast Hampton Abbot, MD      . magnesium hydroxide (MILK OF MAGNESIA) suspension 30 mL  30 mL Oral Daily PRN Patrecia Pour, NP        Musculoskeletal: Strength & Muscle Tone: seen in bed during this assessment Gait & Station: assessed while in bed Patient leans: see above  Psychiatric Specialty Exam: Review of Systems  Psychiatric/Behavioral: Positive for depression and substance abuse. Negative for suicidal ideas and hallucinations. The patient is nervous/anxious and has insomnia.   All other systems reviewed and are negative.      BP 139/96 mmHg  Pulse 82  Temp(Src) 97.8 F (36.6 C) (Oral)  Resp 18  Ht '5\' 10"'  (1.778 m)  Wt 68.04 kg (150 lb)  BMI 21.52 kg/m2  SpO2 98%   General Appearance: Casual and fairly groomed  Engineer, water::  Very good and improved  Speech:  Clear and Coherent and Normal Rate  Volume:  Normal  Mood:  Depressed  Affect:  Congruent, Depressed and Flat  Thought Process:  Coherent, Goal Directed and Intact  Orientation:  Full (Time, Place, and Person)  Thought Content:  Housing, medications  Suicidal Thoughts:  No  Homicidal Thoughts:  No  Memory:  Immediate;   Good Recent;   Good Remote;   Good  Judgement:  Good  Insight:  Good  Psychomotor Activity:  Normal  Concentration:  Good  Recall:  NA  Fund of Knowledge:Good  Language: Good  Akathisia:  NA  Handed:  Right  AIMS (if indicated):     Assets:  Desire for Improvement  ADL's:  Impaired  Cognition: WNL  Sleep:       Treatment Plan Summary: Cocaine dependence with cocaine-induced mood disorder ([place]), stable for outpatient management   Medications: -Genvoya for HIV -Continue Neurontin 313m po bid for mood stabilization -Cntinue hydrodiuril 12.5452mpo daily for HTN  -Cntinue cogentin 52m50mo qhs for EPS  -Cntinue remeron 24m65m qhs for insomnia/depression -Cntinue risperidone 4mg 43mqhs for psychosis  Disposition: Discharge home with outpatient resources; 14 day rx for prescriptions above [facility] TTS to assist pt with finding shelter  WithrBenjamine Mola-BC  03/30/2016 1:31 PM

## 2016-03-30 NOTE — [facility] Counselor (Signed)
Pt will be expected to follow up with [facility] Case Manager Dorthula Perfect [phone]) for housing/shelters. Pt has also agreed to go to [facility] in the morning to see a therapist/medication managment. Pt will be provided with presriptions for 14 day supply. Pt will also be provided with a bus ticket prior to discharge.  Redmond Pulling, MA OBS Counselor

## 2016-03-30 NOTE — Progress Notes (Signed)
Pt has been asleep since eating breakfast this morning. Pt woke up to take his morning medications and was given crackers to eat with his medications. Pt reports passive si thoughts and did not give a specific plan. Pt reports that he overdosed on Neurontin before coming to the hospital. Pt says that he has voices all the time "kill myself and other people." Pt says that he has been to [facility] in the past. He came from [place] "on a bus."  A:Offered support and observation. Gave medications as ordered. R:Pt remains safe in the observation unit.

## 2016-05-19 ENCOUNTER — Other Ambulatory Visit: Payer: Self-pay | Admitting: Infectious Diseases

## 2016-06-01 DIAGNOSIS — R0682 Tachypnea, not elsewhere classified: Secondary | ICD-10-CM | POA: Diagnosis not present

## 2016-06-21 ENCOUNTER — Other Ambulatory Visit: Payer: Self-pay

## 2016-07-05 ENCOUNTER — Ambulatory Visit: Payer: Self-pay | Admitting: Infectious Diseases

## 2016-07-13 ENCOUNTER — Telehealth: Payer: Self-pay | Admitting: *Deleted

## 2016-07-13 ENCOUNTER — Other Ambulatory Visit: Payer: Self-pay | Admitting: Infectious Diseases

## 2016-07-13 DIAGNOSIS — B20 Human immunodeficiency virus [HIV] disease: Secondary | ICD-10-CM

## 2016-07-13 NOTE — Telephone Encounter (Signed)
Patient requesting refill of genvoya, last seen 07/2015. Multiple no-shows since. Left message stating he must be seen for any future refills. [name] Gandy, RN

## 2016-07-15 ENCOUNTER — Emergency Department (HOSPITAL_COMMUNITY): Payer: Medicare Other

## 2016-07-15 ENCOUNTER — Encounter (HOSPITAL_COMMUNITY): Payer: Self-pay

## 2016-07-15 ENCOUNTER — Inpatient Hospital Stay (HOSPITAL_COMMUNITY)
Admission: EM | Admit: 2016-07-15 | Discharge: 2016-07-17 | DRG: 189 | Disposition: A | Discharge status: Home / Self Care | Payer: Medicare Other | Attending: Internal Medicine | Admitting: Internal Medicine

## 2016-07-15 DIAGNOSIS — F17210 Nicotine dependence, cigarettes, uncomplicated: Secondary | ICD-10-CM | POA: Diagnosis not present

## 2016-07-15 DIAGNOSIS — J189 Pneumonia, unspecified organism: Secondary | ICD-10-CM | POA: Diagnosis not present

## 2016-07-15 DIAGNOSIS — F1010 Alcohol abuse, uncomplicated: Secondary | ICD-10-CM | POA: Diagnosis present

## 2016-07-15 DIAGNOSIS — Z79899 Other long term (current) drug therapy: Secondary | ICD-10-CM

## 2016-07-15 DIAGNOSIS — F200 Paranoid schizophrenia: Secondary | ICD-10-CM | POA: Diagnosis present

## 2016-07-15 DIAGNOSIS — I10 Essential (primary) hypertension: Secondary | ICD-10-CM | POA: Diagnosis not present

## 2016-07-15 DIAGNOSIS — R0789 Other chest pain: Secondary | ICD-10-CM

## 2016-07-15 DIAGNOSIS — Z91040 Latex allergy status: Secondary | ICD-10-CM

## 2016-07-15 DIAGNOSIS — E876 Hypokalemia: Secondary | ICD-10-CM

## 2016-07-15 DIAGNOSIS — F609 Personality disorder, unspecified: Secondary | ICD-10-CM | POA: Diagnosis present

## 2016-07-15 DIAGNOSIS — J9601 Acute respiratory failure with hypoxia: Secondary | ICD-10-CM | POA: Diagnosis not present

## 2016-07-15 DIAGNOSIS — R0602 Shortness of breath: Secondary | ICD-10-CM | POA: Diagnosis not present

## 2016-07-15 DIAGNOSIS — Z880 Allergy status to penicillin: Secondary | ICD-10-CM

## 2016-07-15 DIAGNOSIS — B20 Human immunodeficiency virus [HIV] disease: Secondary | ICD-10-CM

## 2016-07-15 DIAGNOSIS — R069 Unspecified abnormalities of breathing: Secondary | ICD-10-CM | POA: Diagnosis not present

## 2016-07-15 LAB — BASIC METABOLIC PANEL
Anion gap: 9 (ref 5–15)
BUN: 17 mg/dL (ref 6–20)
CHLORIDE: 101 mmol/L (ref 101–111)
CO2: 25 mmol/L (ref 22–32)
Calcium: 9.2 mg/dL (ref 8.9–10.3)
Creatinine, Ser: 1.11 mg/dL (ref 0.61–1.24)
GFR calc Af Amer: >60 mL/min (ref >60)
GFR calc non Af Amer: >60 mL/min (ref >60)
GLUCOSE: 78 mg/dL (ref 65–99)
POTASSIUM: 3.2 mmol/L — AB (ref 3.5–5.1)
Sodium: 135 mmol/L (ref 135–145)

## 2016-07-15 LAB — CBC WITH DIFFERENTIAL/PLATELET
Basophils Absolute: 0 10*3/uL (ref 0.0–0.1)
Basophils Relative: 1 %
EOS PCT: 1 %
Eosinophils Absolute: 0.1 10*3/uL (ref 0.0–0.7)
HCT: 40.3 % (ref 39.0–52.0)
Hemoglobin: 14.7 g/dL (ref 13.0–17.0)
LYMPHS ABS: 2.3 10*3/uL (ref 0.7–4.0)
LYMPHS PCT: 45 %
MCH: 34.1 pg — AB (ref 26.0–34.0)
MCHC: 36.5 g/dL — ABNORMAL HIGH (ref 30.0–36.0)
MCV: 93.5 fL (ref 78.0–100.0)
MONO ABS: 0.4 10*3/uL (ref 0.1–1.0)
Monocytes Relative: 8 %
Neutro Abs: 2.3 10*3/uL (ref 1.7–7.7)
Neutrophils Relative %: 45 %
PLATELETS: 140 10*3/uL — AB (ref 150–400)
RBC: 4.31 MIL/uL (ref 4.22–5.81)
RDW: 14.5 % (ref 11.5–15.5)
WBC: 5.1 10*3/uL (ref 4.0–10.5)

## 2016-07-15 MED ORDER — SODIUM CHLORIDE 0.9 % IV BOLUS (SEPSIS)
1000.0000 mL | Freq: Once | INTRAVENOUS | Status: AC
  Administered 2016-07-15: 1000 mL via INTRAVENOUS

## 2016-07-15 MED ORDER — LEVOFLOXACIN IN D5W 750 MG/150ML IV SOLN
750.0000 mg | Freq: Once | INTRAVENOUS | Status: AC
  Administered 2016-07-15: 750 mg via INTRAVENOUS
  Filled 2016-07-15: qty 150

## 2016-07-15 MED ORDER — ALBUTEROL SULFATE (2.5 MG/3ML) 0.083% IN NEBU
5.0000 mg | INHALATION_SOLUTION | Freq: Once | RESPIRATORY_TRACT | Status: AC
  Administered 2016-07-16: 5 mg via RESPIRATORY_TRACT
  Filled 2016-07-15: qty 6

## 2016-07-15 NOTE — ED Triage Notes (Signed)
Pt BIB [facility] from home c/o SOB, chills and cough x 3 days. A&Ox4. Denies N/V/D.

## 2016-07-15 NOTE — ED Provider Notes (Signed)
[facility] DEPT Provider Note   CSN: [id number] Arrival date & time: 07/15/16  2042     History   Chief Complaint Chief Complaint  Patient presents with  . Shortness of Breath    HPI [name] Herring is a 55 y.o. male.  Patient is a 55 year old male with a history of HIV, schizophrenia and hypertension who presents with cough and shortness of breath. He states over the last 2 weeks he's had worsening cough which is essentially nonproductive. He also has had shortness of breath and soreness across his chest. He states the pain is worse with coughing and breathing. He denies any known fevers. No nausea or vomiting. He's not taking any medications for the cough. He states his last HIV counts were good with an undetectable viral load and normal CD4 counts. He's followed by Dr. Johnnye Sima.    Shortness of Breath  Associated symptoms include cough and chest pain. Pertinent negatives include no fever, no headaches, no rhinorrhea, no vomiting, no abdominal pain, no rash and no leg swelling.    Past Medical History:  Diagnosis Date  . Depression   . HIV (human immunodeficiency virus infection) ([place])   . Hypertension   . Immune deficiency disorder ([place])   . Personality disorder   . Schizophrenia [facility])     Patient Active Problem List   Diagnosis Date Noted  . Cocaine dependence with cocaine-induced mood disorder ([place]) 03/29/2016  . HTN (hypertension) 08/25/2015  . Paranoid schizophrenia, chronic condition ([place]) 12/30/2014  . Cocaine use disorder, severe, dependence ([place]) 12/27/2014  . Alcohol use disorder, severe, dependence ([place]) 12/27/2014  . ERECTILE DYSFUNCTION 09/03/2007  . FOOT PAIN, BILATERAL 07/10/2007  . Human immunodeficiency virus (HIV) disease ([place]) 11/09/2006  . VIRAL MENINGITIS 11/09/2006  . THRUSH 11/09/2006  . CA IN SITU, RECTUM 11/09/2006  . HYPOTHYROIDISM 11/09/2006  . DISORDER, BIPOLAR NOS 11/09/2006  . PYELONEPHRITIS 11/09/2006    History reviewed. No  pertinent surgical history.     Home Medications    Prior to Admission medications   Medication Sig Start Date End Date Taking? Authorizing Provider  benztropine (COGENTIN) 1 MG tablet Take 1 tablet (1 mg total) by mouth at bedtime. 03/30/16  Yes Benjamine Mola, FNP  elvitegravir-cobicistat-emtricitabine-tenofovir (GENVOYA) 150-150-200-10 MG TABS tablet TAKE 1 TABLET BY MOUTH DAILY WITH BREAKFAST. Christena Flake [phone] to see Dr Johnnye Sima ASAP* 07/13/16  Yes Campbell Riches, MD  haloperidol decanoate (HALDOL DECANOATE) 50 MG/ML injection Inject 100 mg into the muscle every 28 (twenty-eight) days.   Yes Historical Provider, MD  ibuprofen (ADVIL,MOTRIN) 200 MG tablet Take 400 mg by mouth every 6 (six) hours as needed for headache, mild pain or moderate pain.   Yes Historical Provider, MD  risperiDONE (RISPERDAL) 2 MG tablet Take 2 mg by mouth 2 (two) times daily. 05/30/15  Yes Historical Provider, MD  ciprofloxacin (CIPRO) 500 MG tablet Take 1 tablet (500 mg total) by mouth 2 (two) times daily. Patient not taking: Reported on 07/15/2016 03/30/16   Benjamine Mola, FNP  gabapentin (NEURONTIN) 300 MG capsule Take 1 capsule (300 mg total) by mouth 2 (two) times daily. Patient not taking: Reported on 07/15/2016 03/30/16   Benjamine Mola, FNP  hydrochlorothiazide (HYDRODIURIL) 25 MG tablet Take 0.5 tablets (12.5 mg total) by mouth daily. For high blood pressure Patient not taking: Reported on 07/15/2016 03/30/16   Benjamine Mola, FNP  mirtazapine (REMERON) 45 MG tablet Take 1 tablet (45 mg total) by mouth daily at 8 pm. For depression  and sleep Patient not taking: Reported on 07/15/2016 03/30/16   Benjamine Mola, FNP  risperidone (RISPERDAL) 4 MG tablet Take 1 tablet (4 mg total) by mouth daily at 8 pm. For schizophrenia Patient not taking: Reported on 07/15/2016 03/30/16   Benjamine Mola, FNP    Family History Family History  Problem Relation Age of Onset  . CAD Mother   . Kidney disease Sister   . Lupus  Brother   . Cancer Maternal Grandmother   . Stroke Paternal Grandmother     Social History Social History  Substance Use Topics  . Smoking status: Current Every Day Smoker    Packs/day: 1.00    Types: Cigarettes  . Smokeless tobacco: Never Used  . Alcohol use 0.0 oz/week     Comment: Daily. Last used: unknown time and amount     Allergies   Amoxicillin and Latex   Review of Systems Review of Systems  Constitutional: Positive for fatigue. Negative for chills, diaphoresis and fever.  HENT: Negative for congestion, rhinorrhea and sneezing.   Eyes: Negative.   Respiratory: Positive for cough and shortness of breath. Negative for chest tightness.   Cardiovascular: Positive for chest pain. Negative for leg swelling.  Gastrointestinal: Negative for abdominal pain, blood in stool, diarrhea, nausea and vomiting.  Genitourinary: Negative for difficulty urinating, flank pain, frequency and hematuria.  Musculoskeletal: Negative for arthralgias and back pain.  Skin: Negative for rash.  Neurological: Negative for dizziness, speech difficulty, weakness, numbness and headaches.     Physical Exam Updated Vital Signs BP 106/68 (BP Location: Left Arm)   Pulse 69   Temp 97.4 F (36.3 C) (Axillary)   Resp 22   SpO2 91%   Physical Exam  Constitutional: He is oriented to person, place, and time. He appears well-developed and well-nourished.  HENT:  Head: Normocephalic and atraumatic.  Eyes: Pupils are equal, round, and reactive to light.  Neck: Normal range of motion. Neck supple.  Cardiovascular: Normal rate, regular rhythm and normal heart sounds.   Pulmonary/Chest: Effort normal and breath sounds normal. No respiratory distress. He has no wheezes. He has no rales. He exhibits tenderness.  Abdominal: Soft. Bowel sounds are normal. There is no tenderness. There is no rebound and no guarding.  Musculoskeletal: Normal range of motion. He exhibits edema.  Trace edema to the extremities  bilaterally without calf tenderness  Lymphadenopathy:    He has no cervical adenopathy.  Neurological: He is alert and oriented to person, place, and time.  Skin: Skin is warm and dry. No rash noted.  Psychiatric: He has a normal mood and affect.     ED Treatments / Results  Labs (all labs ordered are listed, but only abnormal results are displayed) Labs Reviewed  BASIC METABOLIC PANEL - Abnormal; Notable for the following:       Result Value   Potassium 3.2 (*)    All other components within normal limits  CBC WITH DIFFERENTIAL/PLATELET - Abnormal; Notable for the following:    MCH 34.1 (*)    MCHC 36.5 (*)    Platelets 140 (*)    All other components within normal limits  RAPID URINE DRUG SCREEN, HOSP PERFORMED  T-HELPER CELLS (CD4) COUNT (NOT AT [facility])  HIV 1 RNA QUANT-NO REFLEX-BLD    EKG  EKG Interpretation  Date/Time:  Sunday July 16 2016 00:09:22 EDT Ventricular Rate:  67 PR Interval:    QRS Duration: 98 QT Interval:  426 QTC Calculation: 450 R Axis:  42 Text Interpretation:  Sinus rhythm similar to EKG from Mar 29 2016 Confirmed by Caidin Heidenreich  MD, Antanisha Mohs [phone]) on 07/16/2016 12:16:18 AM       Radiology Dg Chest 2 View  Result Date: 07/15/2016 CLINICAL DATA:  Shortness of breath, fever, and chills for 3 days. EXAM: CHEST  2 VIEW COMPARISON:  01/06/2004 FINDINGS: The cardiac silhouette is upper limits of normal in size, unchanged. There is mild streaky opacity in the right lung base, and there is also a small area of slightly nodular opacity in the left mid lung. No pleural effusion or pneumothorax is identified. No acute osseous abnormality is seen. IMPRESSION: Mild right basilar and left midlung opacities suspicious for pneumonia. Followup PA and lateral chest X-ray is recommended in 3-4 weeks following trial of antibiotic therapy to ensure resolution and exclude underlying malignancy. Electronically Signed   By: Logan Bores M.D.   On: 07/15/2016 21:58     Procedures Procedures (including critical care time)  Medications Ordered in ED Medications  albuterol (PROVENTIL) (2.5 MG/3ML) 0.083% nebulizer solution 5 mg (not administered)  sodium chloride 0.9 % bolus 1,000 mL (1,000 mLs Intravenous New Bag/Given 07/15/16 2251)  levofloxacin (LEVAQUIN) IVPB 750 mg (750 mg Intravenous New Bag/Given 07/15/16 2248)     Initial Impression / Assessment and Plan / ED Course  I have reviewed the triage vital signs and the nursing notes.  Pertinent labs & imaging results that were available during my care of the patient were reviewed by me and considered in my medical decision making (see chart for details).  Clinical Course    Patient presents with cough and shortness of breath. He has evidence of bilateral pneumonia on x-ray. He has ongoing hypoxia on ambulation with oxygen saturations that dropped to the mid 80s on ambulation. He is satting in the mid 90s on a nasal cannula. He does have associated chest pain which seems musculoskeletal in nature. It's reproducible on palpation across the chest wall and worse with coughing and movement. His EKG is similar to his prior EKG from July 2017. Given his oxygen requirement, I consulted the hospitalist, Dr. Roel Cluck, who will admit the pt.  Final Clinical Impressions(s) / ED Diagnoses   Final diagnoses:  Community acquired pneumonia, unspecified laterality    New Prescriptions New Prescriptions   No medications on file     Malvin Johns, MD 07/16/16 [phone]

## 2016-07-15 NOTE — ED Notes (Signed)
Bed: [id number] Expected date:  Expected time:  Means of arrival:  Comments: 55yo M shob

## 2016-07-15 NOTE — H&P (Signed)
[name] Herring [id number] DOB: 09-30-1960 DOA: 07/15/2016     PCP:  none Outpatient Specialists: Johnnye Sima Patient coming from:  home Lives alone,       Chief Complaint: Shortness of breath  HPI: [name] Herring is a 55 y.o. male with medical history significant of HIV, HTN, drug abuse currently in remission, alcohol abuse states currently in remission    Presented with significant shortness of breath chills and cough for past 3 days, denies fever   no vomiting nausea or diarrhea. No sick contacts. Reports chest pain which is worse with coughing and reproducible by touching his chest.   Regarding pertinent Chronic problems: She has known history of HIV states she's been followed by ID and had recent lab work done with normal CD4 count last CD4 count in the system from November 2016 was 300 HIV RNA less than 20 none detected IN ER:  Temp (24hrs), Avg:97.4 F (36.3 C), Min:97.4 F (36.3 C), Max:97.4 F (36.3 C)      Emergency depend patient became hypoxic after ambulation down to 86% requiring 2 L of  nasal cannula Respirations up to 29 heart rate 69 blood pressure 106/68  Potassium 3.2 WBC 5.1 hemoglobin 14.7 CXR - right basilar and left midlung opasities Following Medications were ordered in ER: Medications  levofloxacin (LEVAQUIN) IVPB 750 mg (750 mg Intravenous New Bag/Given 07/15/16 2248)  albuterol (PROVENTIL) (2.5 MG/3ML) 0.083% nebulizer solution 5 mg (not administered)  sodium chloride 0.9 % bolus 1,000 mL (1,000 mLs Intravenous New Bag/Given 07/15/16 2251)      Hospitalist was called for admission for CAP  Review of Systems:    Pertinent positives include: chills, fatigue, chest pain, Bilateral lower extremity swelling   headaches chronic, Constitutional:  No weight loss, night sweats, Fevers, weight loss  HEENT:  No Difficulty swallowing,Tooth/dental problems,Sore throat,  No sneezing, itching, ear ache, nasal congestion, post nasal drip,    Cardio-vascular:  No  Orthopnea, PND, anasarca, dizziness, palpitations.  GI:  No heartburn, indigestion, abdominal pain, nausea, vomiting, diarrhea, change in bowel habits, loss of appetite, melena, blood in stool, hematemesis Resp:  no shortness of breath at rest. No dyspnea on exertion, No excess mucus, no productive cough, No non-productive cough, No coughing up of blood.No change in color of mucus.No wheezing. Skin:  no rash or lesions. No jaundice GU:  no dysuria, change in color of urine, no urgency or frequency. No straining to urinate.  No flank pain.  Musculoskeletal:  No joint pain or no joint swelling. No decreased range of motion. No back pain.  Psych:  No change in mood or affect. No depression or anxiety. No memory loss.  Neuro: no localizing neurological complaints, no tingling, no weakness, no double vision, no gait abnormality, no slurred speech, no confusion  As per HPI otherwise 10 point review of systems negative.   Past Medical History: Past Medical History:  Diagnosis Date  . Depression   . HIV (human immunodeficiency virus infection) ([place])   . Hypertension   . Immune deficiency disorder ([place])   . Personality disorder   . Schizophrenia ([place])    History reviewed. No pertinent surgical history.   Social History:  Ambulatory   independently      reports that he has been smoking Cigarettes.  He has been smoking about 1.00 pack per day. He does not have any smokeless tobacco history on file. He reports that he drinks alcohol. He reports that he uses drugs, including Cocaine.  Allergies:  Allergies  Allergen Reactions  . Amoxicillin Shortness Of Breath and Rash    Has patient had a PCN reaction causing immediate rash, facial/tongue/throat swelling, [name] Has patient had a PCN reaction causing severe rash involving mucus membranes or skin necrosis: no Has patient had a PCN reaction that required hospitalization:  yes drs office visit Has patient had a PCN reaction occurring within the last 10 years: no If all of the above answers are "NO", then may proceed with Cephalosporin use.   . Latex Shortness Of Breath       Family History:   Family History  Problem Relation Age of Onset  . CAD Mother   . Kidney disease Sister   . Lupus Brother   . Cancer Maternal Grandmother   . Stroke Paternal Grandmother     Medications: Prior to Admission medications   Medication Sig Start Date End Date Taking? Authorizing Provider  benztropine (COGENTIN) 1 MG tablet Take 1 tablet (1 mg total) by mouth at bedtime. 03/30/16  Yes Benjamine Mola, FNP  elvitegravir-cobicistat-emtricitabine-tenofovir (GENVOYA) 150-150-200-10 MG TABS tablet TAKE 1 TABLET BY MOUTH DAILY WITH BREAKFAST. Christena Flake [phone] to see Dr Johnnye Sima ASAP* 07/13/16  Yes Campbell Riches, MD  haloperidol decanoate (HALDOL DECANOATE) 50 MG/ML injection Inject 100 mg into the muscle every 28 (twenty-eight) days.   Yes Historical Provider, MD  ibuprofen (ADVIL,MOTRIN) 200 MG tablet Take 400 mg by mouth every 6 (six) hours as needed for headache, mild pain or moderate pain.   Yes Historical Provider, MD  risperiDONE (RISPERDAL) 2 MG tablet Take 2 mg by mouth 2 (two) times daily. 05/30/15  Yes Historical Provider, MD  ciprofloxacin (CIPRO) 500 MG tablet Take 1 tablet (500 mg total) by mouth 2 (two) times daily. Patient not taking: Reported on 07/15/2016 03/30/16   Benjamine Mola, FNP  gabapentin (NEURONTIN) 300 MG capsule Take 1 capsule (300 mg total) by mouth 2 (two) times daily. Patient not taking: Reported on 07/15/2016 03/30/16   Benjamine Mola, FNP  hydrochlorothiazide (HYDRODIURIL) 25 MG tablet Take 0.5 tablets (12.5 mg total) by mouth daily. For high blood pressure Patient not taking: Reported on 07/15/2016 03/30/16   Benjamine Mola, FNP  mirtazapine (REMERON) 45 MG tablet Take 1 tablet (45 mg total) by mouth daily at 8 pm. For depression and sleep Patient  not taking: Reported on 07/15/2016 03/30/16   Benjamine Mola, FNP  risperidone (RISPERDAL) 4 MG tablet Take 1 tablet (4 mg total) by mouth daily at 8 pm. For schizophrenia Patient not taking: Reported on 07/15/2016 03/30/16   Benjamine Mola, FNP    Physical Exam: Patient Vitals for the past 24 hrs:  BP Temp Temp src Pulse Resp SpO2  07/15/16 2335 106/68 - - 69 22 91 %  07/15/16 2220 104/68 - - 66 26 95 %  07/15/16 2055 120/70 97.4 F (36.3 C) Axillary 75 (!) 29 94 %    1. General:  in No Acute distress 2. Psychological: Alert and    Oriented 3. Head/ENT:     Dry Mucous Membranes                          Head Non traumatic, neck supple                            Poor Dentition 4. SKIN:  decreased Skin turgor,  Skin clean  Dry and intact no rash 5. Heart: Regular rate and rhythm no  Murmur, Rub or gallop 6. Lungs:  , no wheezes some crackles   7. Abdomen: Soft, non-tender, Non distended 8. Lower extremities: no clubbing, cyanosis, or edema 9. Neurologically Grossly intact, moving all 4 extremities equally  10. MSK: Normal range of motion   body mass index is unknown because there is no height or weight on file.  Labs on Admission:   Labs on Admission: I have personally reviewed following labs and imaging studies  CBC:  Recent Labs Lab 07/15/16 2250  WBC 5.1  NEUTROABS 2.3  HGB 14.7  HCT 40.3  MCV 93.5  PLT [id number]   Basic Metabolic Panel:  Recent Labs Lab 07/15/16 2250  NA 135  K 3.2*  CL 101  CO2 25  GLUCOSE 78  BUN 17  CREATININE 1.11  CALCIUM 9.2   GFR: CrCl cannot be calculated (Unknown ideal weight.). Liver Function Tests: No results for input(s): AST, ALT, ALKPHOS, BILITOT, PROT, ALBUMIN in the last 168 hours. No results for input(s): LIPASE, AMYLASE in the last 168 hours. No results for input(s): AMMONIA in the last 168 hours. Coagulation Profile: No results for input(s): INR, PROTIME in the last 168 hours. Cardiac Enzymes: No results for input(s):  CKTOTAL, CKMB, CKMBINDEX, TROPONINI in the last 168 hours. BNP (last 3 results) No results for input(s): PROBNP in the last 8760 hours. HbA1C: No results for input(s): HGBA1C in the last 72 hours. CBG: No results for input(s): GLUCAP in the last 168 hours. Lipid Profile: No results for input(s): CHOL, HDL, LDLCALC, TRIG, CHOLHDL, LDLDIRECT in the last 72 hours. Thyroid Function Tests: No results for input(s): TSH, T4TOTAL, FREET4, T3FREE, THYROIDAB in the last 72 hours. Anemia Panel: No results for input(s): VITAMINB12, FOLATE, FERRITIN, TIBC, IRON, RETICCTPCT in the last 72 hours.  Sepsis Labs: @LABRCNTIP (procalcitonin:4,lacticidven:4) )No results found for this or any previous visit (from the past 240 hour(s)).       UA not ordered  No results found for: HGBA1C  CrCl cannot be calculated (Unknown ideal weight.).  BNP (last 3 results) No results for input(s): PROBNP in the last 8760 hours.   ECG REPORT  Independently reviewed Rate:67  Rhythm: sinus rhythm ST&T Change: LVH changes unchanged from prior QTC 450  There were no vitals filed for this visit.   Cultures: No results found for: [place], [place], CULT, REPTSTATUS   Radiological Exams on Admission: Dg Chest 2 View  Result Date: 07/15/2016 CLINICAL DATA:  Shortness of breath, fever, and chills for 3 days. EXAM: CHEST  2 VIEW COMPARISON:  01/06/2004 FINDINGS: The cardiac silhouette is upper limits of normal in size, unchanged. There is mild streaky opacity in the right lung base, and there is also a small area of slightly nodular opacity in the left mid lung. No pleural effusion or pneumothorax is identified. No acute osseous abnormality is seen. IMPRESSION: Mild right basilar and left midlung opacities suspicious for pneumonia. Followup PA and lateral chest X-ray is recommended in 3-4 weeks following trial of antibiotic therapy to ensure resolution and exclude underlying malignancy. Electronically Signed   By:  Logan Bores M.D.   On: 07/15/2016 21:58    Chart has been reviewed    Assessment/Plan  55 y.o. male with medical history significant of HIV, HTN, drug abuse currently in remission, alcohol abuse states currently in remission admitted for community-acquired pneumonia  Present on Admission:   . CAP (community acquired pneumonia) - penicillin allergy will  cover with Levaquin and vancomycin check CD4 count and HIV viral load patient states he's been compliant with his medications . HTN (hypertension) table currently not on any home medications continue to monitor . Human immunodeficiency virus (HIV) disease ([place]) check CD4 count and HIV viral load will need to notify infectious disease the patient has been admitted in the morning pending results of CD4 count to see if antibiotics need to be broadened to cover opportunistic infections . Paranoid schizophrenia, chronic condition ([place]) stable restart home medications Hypokalemia- replace Chest pain -  reproducible with palpation likely noncardiac but given risk factors cycle cardiac enzymes History of alcohol abuse currently denies. We'll need to monitor for any signs of withdrawal Other plan as per orders.  DVT prophylaxis:    Lovenox     Code Status:  FULL CODE  as per patient    Family Communication:   Family not  at  Bedside   Disposition Plan:    To home once workup is complete and patient is stable                                                                     Consults called: none   Admission status   inpatient      Level of care      tele        I have spent a total of 56 min on this admission    Jaiyon Wander 07/16/2016, 12:25 AM    [facility]  Pager [phone]   after 2 AM please page floor coverage PA If 7AM-7PM, please contact the day team taking care of the patient  [email or web address]  Password [email or web address]

## 2016-07-15 NOTE — ED Notes (Signed)
Pt ambulated for about 20 feet without oxygen.  The patient's oxygen levels were observed at 86% at it's lowest and 91% at it's highest.  Pt asked for oxygen once he was back in his room.

## 2016-07-16 ENCOUNTER — Encounter (HOSPITAL_COMMUNITY): Payer: Self-pay | Admitting: Internal Medicine

## 2016-07-16 DIAGNOSIS — I10 Essential (primary) hypertension: Secondary | ICD-10-CM

## 2016-07-16 DIAGNOSIS — Z880 Allergy status to penicillin: Secondary | ICD-10-CM | POA: Diagnosis not present

## 2016-07-16 DIAGNOSIS — B20 Human immunodeficiency virus [HIV] disease: Secondary | ICD-10-CM | POA: Diagnosis not present

## 2016-07-16 DIAGNOSIS — F200 Paranoid schizophrenia: Secondary | ICD-10-CM

## 2016-07-16 DIAGNOSIS — Z79899 Other long term (current) drug therapy: Secondary | ICD-10-CM | POA: Diagnosis not present

## 2016-07-16 DIAGNOSIS — J189 Pneumonia, unspecified organism: Secondary | ICD-10-CM | POA: Diagnosis not present

## 2016-07-16 DIAGNOSIS — Z91040 Latex allergy status: Secondary | ICD-10-CM | POA: Diagnosis not present

## 2016-07-16 DIAGNOSIS — J9601 Acute respiratory failure with hypoxia: Secondary | ICD-10-CM | POA: Diagnosis not present

## 2016-07-16 DIAGNOSIS — F1010 Alcohol abuse, uncomplicated: Secondary | ICD-10-CM | POA: Diagnosis present

## 2016-07-16 DIAGNOSIS — F17210 Nicotine dependence, cigarettes, uncomplicated: Secondary | ICD-10-CM | POA: Diagnosis present

## 2016-07-16 DIAGNOSIS — F609 Personality disorder, unspecified: Secondary | ICD-10-CM | POA: Diagnosis present

## 2016-07-16 LAB — COMPREHENSIVE METABOLIC PANEL
ALT: 12 U/L — ABNORMAL LOW (ref 17–63)
AST: 20 U/L (ref 15–41)
Albumin: 3.6 g/dL (ref 3.5–5.0)
Alkaline Phosphatase: 45 U/L (ref 38–126)
Anion gap: 9 (ref 5–15)
BILIRUBIN TOTAL: 0.4 mg/dL (ref 0.3–1.2)
BUN: 17 mg/dL (ref 6–20)
CHLORIDE: 104 mmol/L (ref 101–111)
CO2: 23 mmol/L (ref 22–32)
CREATININE: 1.02 mg/dL (ref 0.61–1.24)
Calcium: 8.8 mg/dL — ABNORMAL LOW (ref 8.9–10.3)
Glucose, Bld: 80 mg/dL (ref 65–99)
POTASSIUM: 3.2 mmol/L — AB (ref 3.5–5.1)
Sodium: 136 mmol/L (ref 135–145)
TOTAL PROTEIN: 6.9 g/dL (ref 6.5–8.1)

## 2016-07-16 LAB — RAPID URINE DRUG SCREEN, HOSP PERFORMED
Amphetamines: NOT DETECTED
BARBITURATES: NOT DETECTED
Benzodiazepines: NOT DETECTED
Cocaine: NOT DETECTED
OPIATES: NOT DETECTED
TETRAHYDROCANNABINOL: NOT DETECTED

## 2016-07-16 LAB — CBC WITH DIFFERENTIAL/PLATELET
BASOS ABS: 0 10*3/uL (ref 0.0–0.1)
Basophils Relative: 1 %
EOS ABS: 0.1 10*3/uL (ref 0.0–0.7)
Eosinophils Relative: 1 %
HCT: 41.1 % (ref 39.0–52.0)
HEMOGLOBIN: 14.6 g/dL (ref 13.0–17.0)
LYMPHS ABS: 2 10*3/uL (ref 0.7–4.0)
Lymphocytes Relative: 46 %
MCH: 34 pg (ref 26.0–34.0)
MCHC: 35.5 g/dL (ref 30.0–36.0)
MCV: 95.6 fL (ref 78.0–100.0)
Monocytes Absolute: 0.4 10*3/uL (ref 0.1–1.0)
Monocytes Relative: 10 %
NEUTROS PCT: 42 %
Neutro Abs: 1.8 10*3/uL (ref 1.7–7.7)
PLATELETS: 138 10*3/uL — AB (ref 150–400)
RBC: 4.3 MIL/uL (ref 4.22–5.81)
RDW: 14.6 % (ref 11.5–15.5)
WBC: 4.4 10*3/uL (ref 4.0–10.5)

## 2016-07-16 LAB — TROPONIN I: Troponin I: <0.03 ng/mL (ref <0.03)

## 2016-07-16 LAB — STREP PNEUMONIAE URINARY ANTIGEN: STREP PNEUMO URINARY ANTIGEN: NEGATIVE

## 2016-07-16 LAB — MRSA PCR SCREENING: MRSA BY PCR: NEGATIVE

## 2016-07-16 MED ORDER — IPRATROPIUM-ALBUTEROL 0.5-2.5 (3) MG/3ML IN SOLN
3.0000 mL | Freq: Four times a day (QID) | RESPIRATORY_TRACT | Status: DC
  Administered 2016-07-16 (×3): 3 mL via RESPIRATORY_TRACT
  Filled 2016-07-16 (×3): qty 3

## 2016-07-16 MED ORDER — IPRATROPIUM-ALBUTEROL 0.5-2.5 (3) MG/3ML IN SOLN
3.0000 mL | Freq: Three times a day (TID) | RESPIRATORY_TRACT | Status: DC
Start: 2016-07-17 — End: 2016-07-17
  Administered 2016-07-17: 3 mL via RESPIRATORY_TRACT
  Filled 2016-07-16: qty 3

## 2016-07-16 MED ORDER — VANCOMYCIN HCL IN DEXTROSE 1-5 GM/200ML-% IV SOLN
1000.0000 mg | INTRAVENOUS | Status: AC
  Administered 2016-07-16: 1000 mg via INTRAVENOUS
  Filled 2016-07-16: qty 200

## 2016-07-16 MED ORDER — RISPERIDONE 1 MG PO TABS
2.0000 mg | ORAL_TABLET | Freq: Two times a day (BID) | ORAL | Status: DC
  Administered 2016-07-16 – 2016-07-17 (×5): 2 mg via ORAL
  Filled 2016-07-16 (×5): qty 2

## 2016-07-16 MED ORDER — ENOXAPARIN SODIUM 40 MG/0.4ML ~~LOC~~ SOLN
40.0000 mg | SUBCUTANEOUS | Status: DC
  Administered 2016-07-16 – 2016-07-17 (×2): 40 mg via SUBCUTANEOUS
  Filled 2016-07-16 (×2): qty 0.4

## 2016-07-16 MED ORDER — SALINE SPRAY 0.65 % NA SOLN
1.0000 | NASAL | Status: DC | PRN
  Administered 2016-07-16: 1 via NASAL
  Filled 2016-07-16: qty 44

## 2016-07-16 MED ORDER — BENZTROPINE MESYLATE 0.5 MG PO TABS
1.0000 mg | ORAL_TABLET | Freq: Every day | ORAL | Status: DC
  Administered 2016-07-16 (×2): 1 mg via ORAL
  Filled 2016-07-16 (×2): qty 2

## 2016-07-16 MED ORDER — LEVOFLOXACIN IN D5W 750 MG/150ML IV SOLN
750.0000 mg | INTRAVENOUS | Status: DC
  Administered 2016-07-16: 750 mg via INTRAVENOUS
  Filled 2016-07-16: qty 150

## 2016-07-16 MED ORDER — ELVITEG-COBIC-EMTRICIT-TENOFAF 150-150-200-10 MG PO TABS
1.0000 | ORAL_TABLET | Freq: Every day | ORAL | Status: DC
  Administered 2016-07-16 – 2016-07-17 (×2): 1 via ORAL
  Filled 2016-07-16 (×2): qty 1

## 2016-07-16 MED ORDER — ALBUTEROL SULFATE (2.5 MG/3ML) 0.083% IN NEBU: 2.5000 mg | INHALATION_SOLUTION | RESPIRATORY_TRACT | Status: DC | PRN

## 2016-07-16 MED ORDER — SODIUM CHLORIDE 0.9 % IV SOLN
INTRAVENOUS | Status: AC
  Administered 2016-07-16: 02:00:00 via INTRAVENOUS

## 2016-07-16 MED ORDER — VANCOMYCIN HCL IN DEXTROSE 750-5 MG/150ML-% IV SOLN
750.0000 mg | Freq: Two times a day (BID) | INTRAVENOUS | Status: DC
  Administered 2016-07-16: 750 mg via INTRAVENOUS
  Filled 2016-07-16: qty 150

## 2016-07-16 MED ORDER — POTASSIUM CHLORIDE 10 MEQ/100ML IV SOLN
10.0000 meq | INTRAVENOUS | Status: AC
  Administered 2016-07-16 (×4): 10 meq via INTRAVENOUS
  Filled 2016-07-16 (×4): qty 100

## 2016-07-16 NOTE — Progress Notes (Signed)
Pharmacy Antibiotic Note  [name] Herring is a 55 y.o. male admitted on 07/15/2016 with pneumonia.  PMH significant for HIV, HTN, h/o drug/alcohol abuse (in remission). Pharmacy has been consulted for Vancomycin dosing and to adjust other antibiotic dosing as needed based on renal function.  Plan:  Vancomycin 1gm IV x 1 then 750mg  IV q12h  Vancomycin trough goal: 15-20 mcg/ml  Continue Levaquin 750mg  IV q24h as ordered by MD  F/U cultures, renal function  Height: 5\' 10"  (177.8 cm) Weight: 144 lb 9.6 oz (65.6 kg) IBW/kg (Calculated) : 73  Temp (24hrs), Avg:97.6 F (36.4 C), Min:97.4 F (36.3 C), Max:97.7 F (36.5 C)   Recent Labs Lab 07/15/16 2250  WBC 5.1  CREATININE 1.11    Estimated Creatinine Clearance: 70.6 mL/min (by C-G formula based on SCr of 1.11 mg/dL).    Allergies  Allergen Reactions  . Amoxicillin Shortness Of Breath and Rash    Has patient had a PCN reaction causing immediate rash, facial/tongue/throat swelling, [name] Has patient had a PCN reaction causing severe rash involving mucus membranes or skin necrosis: no Has patient had a PCN reaction that required hospitalization: yes drs office visit Has patient had a PCN reaction occurring within the last 10 years: no If all of the above answers are "NO", then may proceed with Cephalosporin use.   . Latex Shortness Of Breath    Antimicrobials this admission: 10/22 vanc >>   10/21 levaquin >>    Dose adjustments this admission:    Microbiology results: 10/22 Sputum: sent   Thank you for allowing pharmacy to be a part of this patient's care.  Everette Rank, PharmD 07/16/2016 2:07 AM

## 2016-07-16 NOTE — Progress Notes (Signed)
PROGRESS NOTE  [name] Herring  [id number] DOB: 1961-04-08 DOA: 07/15/2016 PCP: No PCP Per Patient  Brief Narrative:   [name] Herring is a 55 y.o. male with medical history significant of HIV reportedly compliant with ART, never diagnosed with AIDS defining illness and never required prophylactic antibiotics, HTN, drug abuse currently in remission, alcohol abuse currently in remission.  Presented with shortness of breath, chills and cough for 3 days.  Patient became hypoxic after ambulation down to 86% requiring 2 L of  nasal cannula.  He appears much more ill than vital signs suggest.    Assessment & Plan:   Active Problems:   Human immunodeficiency virus (HIV) disease ([place])   Paranoid schizophrenia, chronic condition ([place])   HTN (hypertension)   CAP (community acquired pneumonia)   CAP, with penicillin allergy -  Continue levofloxacin -  D/c vancomycin  -  Remains afebrile and without leukocytosis  HIV -  CD4 count may be falsely suppressed in setting of acute illness -  F/u HIV VL -  Continue Genvoya  Hypertension, continue to hold HCTZ  Paranoid schizophrenia, stable, continue risperidone.  DVT prophylaxis:  lovenox Code Status:  full Family Communication:  Patient.  Also spoke with his sister Louanne Skye Disposition Plan:  Home once feeling better, able to ambulate without need for oxygen   Consultants:   none  Procedures:  none  Antimicrobials:   Levofloxacin 10/21    Subjective: Feeling about the same as when he was admitted.  Still having chest pains with cough, severe fatigue.    Objective: Vitals:   07/16/16 0605 07/16/16 0907 07/16/16 1000 07/16/16 1400  BP: 111/64  115/70   Pulse: 62  71   Resp: 20  18   Temp: 98.3 F (36.8 C)  98.4 F (36.9 C)   TempSrc: Oral  Oral   SpO2: 98% 97% 96% 95%  Weight: 65.6 kg (144 lb 9.6 oz)     Height:        Intake/Output Summary (Last 24 hours) at 07/16/16 1522 Last data filed at 07/16/16  0900  Gross per 24 hour  Intake          1053.33 ml  Output              900 ml  Net           153.33 ml   Filed Weights   07/16/16 0202 07/16/16 0605  Weight: 65.6 kg (144 lb 9.6 oz) 65.6 kg (144 lb 9.6 oz)    Examination:  General exam:  Adult male.  No acute distress.  HEENT:  NCAT, MMM Respiratory system:   Diminished bilateral breath sounds/bronchial throughout, no wheezes, rales, or rhonchi Cardiovascular system: Regular rate and rhythm, normal S1/S2. No murmurs, rubs, gallops or clicks.  Warm extremities Gastrointestinal system: Normal active bowel sounds, soft, nondistended, nontender. MSK:  Normal tone and bulk, no lower extremity edema Neuro:  Grossly intact    Data Reviewed: I have personally reviewed following labs and imaging studies  CBC:  Recent Labs Lab 07/15/16 2250 07/16/16 0209  WBC 5.1 4.4  NEUTROABS 2.3 1.8  HGB 14.7 14.6  HCT 40.3 41.1  MCV 93.5 95.6  PLT 140* 0000000*   Basic Metabolic Panel:  Recent Labs Lab 07/15/16 2250 07/16/16 0209  NA 135 136  K 3.2* 3.2*  CL 101 104  CO2 25 23  GLUCOSE 78 80  BUN 17 17  CREATININE 1.11 1.02  CALCIUM 9.2 8.8*  GFR: Estimated Creatinine Clearance: 76.8 mL/min (by C-G formula based on SCr of 1.02 mg/dL). Liver Function Tests:  Recent Labs Lab 07/16/16 0209  AST 20  ALT 12*  ALKPHOS 45  BILITOT 0.4  PROT 6.9  ALBUMIN 3.6   No results for input(s): LIPASE, AMYLASE in the last 168 hours. No results for input(s): AMMONIA in the last 168 hours. Coagulation Profile: No results for input(s): INR, PROTIME in the last 168 hours. Cardiac Enzymes:  Recent Labs Lab 07/16/16 0209 07/16/16 0909  TROPONINI <0.03 <0.03   BNP (last 3 results) No results for input(s): PROBNP in the last 8760 hours. HbA1C: No results for input(s): HGBA1C in the last 72 hours. CBG: No results for input(s): GLUCAP in the last 168 hours. Lipid Profile: No results for input(s): CHOL, HDL, LDLCALC, TRIG, CHOLHDL,  LDLDIRECT in the last 72 hours. Thyroid Function Tests: No results for input(s): TSH, T4TOTAL, FREET4, T3FREE, THYROIDAB in the last 72 hours. Anemia Panel: No results for input(s): VITAMINB12, FOLATE, FERRITIN, TIBC, IRON, RETICCTPCT in the last 72 hours. Urine analysis:    Component Value Date/Time   COLORURINE YELLOW 03/29/2016 0205   APPEARANCEUR CLEAR 03/29/2016 0205   LABSPEC 1.021 03/29/2016 0205   PHURINE 6.0 03/29/2016 0205   GLUCOSEU NEGATIVE 03/29/2016 0205   GLUCOSEU NEG mg/dL 10/11/2006 2016   HGBUR NEGATIVE 03/29/2016 0205   BILIRUBINUR NEGATIVE 03/29/2016 0205   KETONESUR 40 (A) 03/29/2016 0205   PROTEINUR NEGATIVE 03/29/2016 0205   UROBILINOGEN 1 10/11/2006 2016   NITRITE POSITIVE (A) 03/29/2016 0205   LEUKOCYTESUR NEGATIVE 03/29/2016 0205   Sepsis Labs: @LABRCNTIP (procalcitonin:4,lacticidven:4)  )No results found for this or any previous visit (from the past 240 hour(s)).    Radiology Studies: Dg Chest 2 View  Result Date: 07/15/2016 CLINICAL DATA:  Shortness of breath, fever, and chills for 3 days. EXAM: CHEST  2 VIEW COMPARISON:  01/06/2004 FINDINGS: The cardiac silhouette is upper limits of normal in size, unchanged. There is mild streaky opacity in the right lung base, and there is also a small area of slightly nodular opacity in the left mid lung. No pleural effusion or pneumothorax is identified. No acute osseous abnormality is seen. IMPRESSION: Mild right basilar and left midlung opacities suspicious for pneumonia. Followup PA and lateral chest X-ray is recommended in 3-4 weeks following trial of antibiotic therapy to ensure resolution and exclude underlying malignancy. Electronically Signed   By: Logan Bores M.D.   On: 07/15/2016 21:58     Scheduled Meds: . benztropine  1 mg Oral QHS  . elvitegravir-cobicistat-emtricitabine-tenofovir  1 tablet Oral Q breakfast  . enoxaparin (LOVENOX) injection  40 mg Subcutaneous Q24H  . ipratropium-albuterol  3 mL  Nebulization Q6H  . levofloxacin (LEVAQUIN) IV  750 mg Intravenous Q24H  . risperiDONE  2 mg Oral BID  . vancomycin  750 mg Intravenous Q12H   Continuous Infusions:    LOS: 0 days    Time spent: 30 min    Janece Canterbury, MD [facility] Pager [phone]  If 7PM-7AM, please contact night-coverage [email or web address] Password [email or web address] 07/16/2016, 3:22 PM

## 2016-07-17 DIAGNOSIS — J9601 Acute respiratory failure with hypoxia: Secondary | ICD-10-CM

## 2016-07-17 LAB — CBC
HEMATOCRIT: 42.2 % (ref 39.0–52.0)
Hemoglobin: 15 g/dL (ref 13.0–17.0)
MCH: 33.3 pg (ref 26.0–34.0)
MCHC: 35.5 g/dL (ref 30.0–36.0)
MCV: 93.8 fL (ref 78.0–100.0)
PLATELETS: 137 10*3/uL — AB (ref 150–400)
RBC: 4.5 MIL/uL (ref 4.22–5.81)
RDW: 14.8 % (ref 11.5–15.5)
WBC: 5.1 10*3/uL (ref 4.0–10.5)

## 2016-07-17 LAB — BASIC METABOLIC PANEL
Anion gap: 6 (ref 5–15)
BUN: 10 mg/dL (ref 6–20)
CALCIUM: 9.5 mg/dL (ref 8.9–10.3)
CO2: 24 mmol/L (ref 22–32)
CREATININE: 0.74 mg/dL (ref 0.61–1.24)
Chloride: 109 mmol/L (ref 101–111)
GFR calc Af Amer: >60 mL/min (ref >60)
GLUCOSE: 88 mg/dL (ref 65–99)
Potassium: 3.6 mmol/L (ref 3.5–5.1)
SODIUM: 139 mmol/L (ref 135–145)

## 2016-07-17 LAB — HIV-1 RNA QUANT-NO REFLEX-BLD: LOG10 HIV-1 RNA: UNDETERMINED log10copy/mL

## 2016-07-17 LAB — T-HELPER CELLS (CD4) COUNT (NOT AT ARMC)
CD4 T CELL ABS: 470 /uL (ref 400–2700)
CD4 T CELL HELPER: 23 % — AB (ref 33–55)

## 2016-07-17 MED ORDER — RISPERIDONE 2 MG PO TABS: 2.0000 mg | ORAL_TABLET | Freq: Two times a day (BID) | ORAL | 0 refills | Status: DC

## 2016-07-17 MED ORDER — LEVOFLOXACIN 750 MG PO TABS: 750.0000 mg | ORAL_TABLET | Freq: Every day | ORAL | 0 refills | Status: DC

## 2016-07-17 MED ORDER — ELVITEG-COBIC-EMTRICIT-TENOFAF 150-150-200-10 MG PO TABS: ORAL_TABLET | ORAL | 1 refills | Status: DC

## 2016-07-17 MED FILL — levoFLOXacin 750 MG TABS: 750 | 5 days supply | Qty: 5 | Fill #0

## 2016-07-17 MED FILL — risperiDONE 2 MG TABS: 2 | 30 days supply | Qty: 60 | Fill #0

## 2016-07-17 NOTE — Progress Notes (Signed)
Spoke with patient during rounds at bedside. Patient does not have a PCP, discussed issues with noncompliance and importance of f/u. Provided patient with resources to obtain PCP close to him. He assures me that he will make an appt. He also agrees to make appt for f/u with ID clinic, he confirms it has been over a year since he was seen in the ID clinic. He states gets most of his meds through the [employer] (psych meds). He uses a pharmacy in [place], they usually deliver within 2 days. He will go to [facility] for abx since he needs those right away. No further needs assessed, anxious to go home, appreciative of assistance.

## 2016-07-17 NOTE — Progress Notes (Signed)
Patient ambulated in hall on room air with sat's remaining at 97%. No complaints of any shortness of breath.  Will continue to monitor.

## 2016-07-17 NOTE — Discharge Summary (Signed)
Physician Discharge Summary  [name] Herring [id number] DOB: 12-09-1960 DOA: 07/15/2016  PCP: No PCP Per Patient  Admit date: 07/15/2016 Discharge date: 07/17/2016  Admitted From: home  Disposition:  home  Recommendations for Outpatient Follow-up:  1. Follow up with ID in 1-2 weeks 2. Given RX for levofloxacin to complete a 7-day course of antibiotics  Home Health:  none  Equipment/Devices:  None  Discharge Condition:  Stable, improved CODE STATUS:  full  Diet recommendation:  regular   Brief/Interim Summary:  [name] Herring a 55 y.o.malewith medical history significant ofHIV reportedly compliant with ART, never diagnosed with AIDS defining illness and never required prophylactic antibiotics, HTN, drug abuse currently in remission, alcohol abuse currently in remission.  Presented with shortness of breath, chills and cough for 3 days.  Patient became hypoxic after ambulation down to 86% requiring 2 L of nasal cannula.  He was started on antibiotics for CAP and had quick improvement in his dyspnea and fatigue.  On the day of discharge, he was able to ambulate the halls maintaining oxygen saturation greater than 97% without supplemental oxygen.  His CD4 count was 470 and his VL was undetectable.  Although he has missed his ID appointments, according to pharmacy, he has continued to be compliant with his ART.  Recommend he follow up with ID clinic post-discharge to reestablish care.    Discharge Diagnoses:  Principal Problem:   Acute respiratory failure with hypoxia ([facility]) Active Problems:   Human immunodeficiency virus (HIV) disease ([facility])   Paranoid schizophrenia, chronic condition ([place])   HTN (hypertension)   CAP (community acquired pneumonia)  Acute respiratory failure due to CAP, with penicillin allergy -  Continued levofloxacin x 7 days -  Remained afebrile and without leukocytosis  HIV -  CD4 count 470 -  HIV VL undetectable -  Continued Genvoya  Hypertension,  due to low normal blood pressures, discontinued HCTZ  Discharge Instructions  Discharge Instructions    Call MD for:  difficulty breathing, headache or visual disturbances    Complete by:  As directed    Call MD for:  extreme fatigue    Complete by:  As directed    Call MD for:  hives    Complete by:  As directed    Call MD for:  persistant dizziness or light-headedness    Complete by:  As directed    Call MD for:  persistant nausea and vomiting    Complete by:  As directed    Call MD for:  severe uncontrolled pain    Complete by:  As directed    Call MD for:  temperature >100.4    Complete by:  As directed    Diet general    Complete by:  As directed    Increase activity slowly    Complete by:  As directed        Medication List    STOP taking these medications   ciprofloxacin 500 MG tablet Commonly known as:  CIPRO   gabapentin 300 MG capsule Commonly known as:  NEURONTIN   hydrochlorothiazide 25 MG tablet Commonly known as:  HYDRODIURIL   ibuprofen 200 MG tablet Commonly known as:  ADVIL,MOTRIN   mirtazapine 45 MG tablet Commonly known as:  REMERON     TAKE these medications   benztropine 1 MG tablet Commonly known as:  COGENTIN Take 1 tablet (1 mg total) by mouth at bedtime.   elvitegravir-cobicistat-emtricitabine-tenofovir 150-150-200-10 MG Tabs tablet Commonly known as:  GENVOYA TAKE 1  TABLET BY MOUTH DAILY WITH BREAKFAST. *CALL [phone] to see Dr Johnnye Sima ASAP*   haloperidol decanoate 50 MG/ML injection Commonly known as:  HALDOL DECANOATE Inject 100 mg into the muscle every 28 (twenty-eight) days.   levofloxacin 750 MG tablet Commonly known as:  LEVAQUIN Take 1 tablet (750 mg total) by mouth daily.   risperiDONE 2 MG tablet Commonly known as:  RISPERDAL Take 1 tablet (2 mg total) by mouth 2 (two) times daily. What changed:  Another medication with the same name was removed. Continue taking this medication, and follow the directions you see  here.      Follow-up Information    [facility]             . Schedule an appointment as soon as possible for a visit in 1 week(s).   Contact information: [place] [phone]         Allergies  Allergen Reactions  . Amoxicillin Shortness Of Breath and Rash    Has patient had a PCN reaction causing immediate rash, facial/tongue/throat swelling, [name] Has patient had a PCN reaction causing severe rash involving mucus membranes or skin necrosis: no Has patient had a PCN reaction that required hospitalization: yes drs office visit Has patient had a PCN reaction occurring within the last 10 years: no If all of the above answers are "NO", then may proceed with Cephalosporin use.   . Latex Shortness Of Breath    Consultations: none   Procedures/Studies: Dg Chest 2 View  Result Date: 07/15/2016 CLINICAL DATA:  Shortness of breath, fever, and chills for 3 days. EXAM: CHEST  2 VIEW COMPARISON:  01/06/2004 FINDINGS: The cardiac silhouette is upper limits of normal in size, unchanged. There is mild streaky opacity in the right lung base, and there is also a small area of slightly nodular opacity in the left mid lung. No pleural effusion or pneumothorax is identified. No acute osseous abnormality is seen. IMPRESSION: Mild right basilar and left midlung opacities suspicious for pneumonia. Followup PA and lateral chest X-ray is recommended in 3-4 weeks following trial of antibiotic therapy to ensure resolution and exclude underlying malignancy. Electronically Signed   By: Logan Bores M.D.   On: 07/15/2016 21:58    Subjective: Feeling much better today.  Would like to go home.  Still coughing but less so.  SOB improved.  Energy back.    Discharge Exam: Vitals:   07/17/16 0446 07/17/16 0905  BP: 118/80 122/90  Pulse: (!) 58 80  Resp: 19 18  Temp: 98 F (36.7 C) 98 F (36.7 C)    Vitals:   07/16/16 2006 07/17/16 0446 07/17/16 0835 07/17/16 0905  BP:  118/80  122/90  Pulse:  (!) 58  80  Resp:  19  18  Temp:  98 F (36.7 C)  98 F (36.7 C)  TempSrc:  Oral  Oral  SpO2: 97% 99% 95% 99%  Weight:      Height:        General exam:  Adult male.  No acute distress.  Sitting up in bed, smiling HEENT:  NCAT, MMM Respiratory system:   Diminished bilateral breath sounds/bronchial throughout, no wheezes, rales, or rhonchi Cardiovascular system: Regular rate and rhythm, normal S1/S2. No murmurs, rubs, gallops or clicks.  Warm extremities Gastrointestinal system: Normal active bowel sounds, soft, nondistended, nontender. MSK:  Normal tone and bulk, no lower extremity edema Neuro:  Grossly  intact     The results of significant diagnostics from this hospitalization (including imaging, microbiology, ancillary and laboratory) are listed below for reference.     Microbiology: Recent Results (from the past 240 hour(s))  MRSA PCR Screening     Status: None   Collection Time: 07/16/16  3:30 PM  Result Value Ref Range Status   MRSA by PCR NEGATIVE NEGATIVE Final    Comment:        The GeneXpert MRSA Assay (FDA approved for NASAL specimens only), is one component of a comprehensive MRSA colonization surveillance program. It is not intended to diagnose MRSA infection nor to guide or monitor treatment for MRSA infections.      Labs: BNP (last 3 results) No results for input(s): BNP in the last 8760 hours. Basic Metabolic Panel:  Recent Labs Lab 07/15/16 2250 07/16/16 0209 07/17/16 0412  NA 135 136 139  K 3.2* 3.2* 3.6  CL 101 104 109  CO2 25 23 24   GLUCOSE 78 80 88  BUN 17 17 10   CREATININE 1.11 1.02 0.74  CALCIUM 9.2 8.8* 9.5   Liver Function Tests:  Recent Labs Lab 07/16/16 0209  AST 20  ALT 12*  ALKPHOS 45  BILITOT 0.4  PROT 6.9  ALBUMIN 3.6   No results for input(s): LIPASE, AMYLASE in the last 168 hours. No results for input(s):  AMMONIA in the last 168 hours. CBC:  Recent Labs Lab 07/15/16 2250 07/16/16 0209 07/17/16 0412  WBC 5.1 4.4 5.1  NEUTROABS 2.3 1.8  --   HGB 14.7 14.6 15.0  HCT 40.3 41.1 42.2  MCV 93.5 95.6 93.8  PLT 140* 138* 137*   Cardiac Enzymes:  Recent Labs Lab 07/16/16 0209 07/16/16 0909 07/16/16 1438  TROPONINI <0.03 <0.03 <0.03   BNP: Invalid input(s): POCBNP CBG: No results for input(s): GLUCAP in the last 168 hours. D-Dimer No results for input(s): DDIMER in the last 72 hours. Hgb A1c No results for input(s): HGBA1C in the last 72 hours. Lipid Profile No results for input(s): CHOL, HDL, LDLCALC, TRIG, CHOLHDL, LDLDIRECT in the last 72 hours. Thyroid function studies No results for input(s): TSH, T4TOTAL, T3FREE, THYROIDAB in the last 72 hours.  Invalid input(s): FREET3 Anemia work up No results for input(s): VITAMINB12, FOLATE, FERRITIN, TIBC, IRON, RETICCTPCT in the last 72 hours. Urinalysis    Component Value Date/Time   COLORURINE YELLOW 03/29/2016 0205   APPEARANCEUR CLEAR 03/29/2016 0205   LABSPEC 1.021 03/29/2016 0205   PHURINE 6.0 03/29/2016 0205   GLUCOSEU NEGATIVE 03/29/2016 0205   GLUCOSEU NEG mg/dL 10/11/2006 2016   HGBUR NEGATIVE 03/29/2016 0205   BILIRUBINUR NEGATIVE 03/29/2016 0205   KETONESUR 40 (A) 03/29/2016 0205   PROTEINUR NEGATIVE 03/29/2016 0205   UROBILINOGEN 1 10/11/2006 2016   NITRITE POSITIVE (A) 03/29/2016 0205   LEUKOCYTESUR NEGATIVE 03/29/2016 0205   Sepsis Labs Invalid input(s): PROCALCITONIN,  WBC,  LACTICIDVEN   Time coordinating discharge: Over 30 minutes  SIGNED:   Janece Canterbury, MD  [facility] 07/17/2016, 4:58 PM Pager   If 7PM-7AM, please contact night-coverage [email or web address] Password [email or web address]

## 2016-09-12 ENCOUNTER — Other Ambulatory Visit: Payer: Self-pay | Admitting: Infectious Diseases

## 2016-09-12 DIAGNOSIS — B20 Human immunodeficiency virus [HIV] disease: Secondary | ICD-10-CM

## 2016-09-25 DIAGNOSIS — I2699 Other pulmonary embolism without acute cor pulmonale: Secondary | ICD-10-CM

## 2016-09-25 HISTORY — DX: Other pulmonary embolism without acute cor pulmonale: I26.99

## 2016-10-20 ENCOUNTER — Encounter (HOSPITAL_COMMUNITY): Payer: Self-pay | Admitting: Emergency Medicine

## 2016-10-20 ENCOUNTER — Emergency Department (HOSPITAL_COMMUNITY): Payer: Medicare Other

## 2016-10-20 ENCOUNTER — Emergency Department (HOSPITAL_COMMUNITY)
Admission: EM | Admit: 2016-10-20 | Discharge: 2016-10-20 | Disposition: A | Discharge status: Home / Self Care | Payer: Medicare Other | Attending: Emergency Medicine | Admitting: Emergency Medicine

## 2016-10-20 DIAGNOSIS — E876 Hypokalemia: Secondary | ICD-10-CM | POA: Insufficient documentation

## 2016-10-20 DIAGNOSIS — R0789 Other chest pain: Secondary | ICD-10-CM | POA: Diagnosis not present

## 2016-10-20 DIAGNOSIS — I10 Essential (primary) hypertension: Secondary | ICD-10-CM | POA: Insufficient documentation

## 2016-10-20 DIAGNOSIS — R079 Chest pain, unspecified: Secondary | ICD-10-CM

## 2016-10-20 DIAGNOSIS — Z79899 Other long term (current) drug therapy: Secondary | ICD-10-CM | POA: Insufficient documentation

## 2016-10-20 DIAGNOSIS — R071 Chest pain on breathing: Secondary | ICD-10-CM | POA: Diagnosis not present

## 2016-10-20 DIAGNOSIS — F17210 Nicotine dependence, cigarettes, uncomplicated: Secondary | ICD-10-CM | POA: Insufficient documentation

## 2016-10-20 LAB — CBC WITH DIFFERENTIAL/PLATELET
BASOS ABS: 0 10*3/uL (ref 0.0–0.1)
BASOS PCT: 1 %
Eosinophils Absolute: 0.1 10*3/uL (ref 0.0–0.7)
Eosinophils Relative: 2 %
HEMATOCRIT: 38.3 % — AB (ref 39.0–52.0)
Hemoglobin: 13.2 g/dL (ref 13.0–17.0)
Lymphocytes Relative: 43 %
Lymphs Abs: 1.9 10*3/uL (ref 0.7–4.0)
MCH: 34.5 pg — ABNORMAL HIGH (ref 26.0–34.0)
MCHC: 34.5 g/dL (ref 30.0–36.0)
MCV: 100 fL (ref 78.0–100.0)
MONO ABS: 0.4 10*3/uL (ref 0.1–1.0)
Monocytes Relative: 9 %
NEUTROS ABS: 2 10*3/uL (ref 1.7–7.7)
NEUTROS PCT: 46 %
Platelets: 79 10*3/uL — ABNORMAL LOW (ref 150–400)
RBC: 3.83 MIL/uL — ABNORMAL LOW (ref 4.22–5.81)
RDW: 17 % — AB (ref 11.5–15.5)
WBC: 4.3 10*3/uL (ref 4.0–10.5)

## 2016-10-20 LAB — BASIC METABOLIC PANEL
ANION GAP: 10 (ref 5–15)
BUN: 16 mg/dL (ref 6–20)
CALCIUM: 8.4 mg/dL — AB (ref 8.9–10.3)
CO2: 24 mmol/L (ref 22–32)
Chloride: 107 mmol/L (ref 101–111)
Creatinine, Ser: 1.05 mg/dL (ref 0.61–1.24)
GLUCOSE: 75 mg/dL (ref 65–99)
Potassium: 2.9 mmol/L — ABNORMAL LOW (ref 3.5–5.1)
SODIUM: 141 mmol/L (ref 135–145)

## 2016-10-20 LAB — I-STAT TROPONIN, ED: Troponin i, poc: 0 ng/mL (ref 0.00–0.08)

## 2016-10-20 MED ORDER — POTASSIUM CHLORIDE CRYS ER 20 MEQ PO TBCR
40.0000 meq | EXTENDED_RELEASE_TABLET | Freq: Once | ORAL | Status: AC
  Administered 2016-10-20: 40 meq via ORAL
  Filled 2016-10-20: qty 2

## 2016-10-20 MED ORDER — POTASSIUM CHLORIDE CRYS ER 20 MEQ PO TBCR: 20.0000 meq | EXTENDED_RELEASE_TABLET | Freq: Three times a day (TID) | ORAL | 0 refills | Status: DC

## 2016-10-20 MED ORDER — ONDANSETRON 4 MG PO TBDP
8.0000 mg | ORAL_TABLET | Freq: Once | ORAL | Status: AC
  Administered 2016-10-20: 8 mg via ORAL
  Filled 2016-10-20: qty 2

## 2016-10-20 MED ORDER — KETOROLAC TROMETHAMINE 60 MG/2ML IM SOLN
60.0000 mg | Freq: Once | INTRAMUSCULAR | Status: AC
  Administered 2016-10-20: 60 mg via INTRAMUSCULAR
  Filled 2016-10-20: qty 2

## 2016-10-20 NOTE — ED Notes (Signed)
ED Provider at bedside. 

## 2016-10-20 NOTE — ED Triage Notes (Signed)
Pt from home by EMS with CP going on 4 days. Per EMS, pt diaphoretic and N/V upon their arrival. Pt reports SOB (91% on RA), N/V began tonight. Pt admits to ETOH use tonight (vodka, unknown quantity). Rec'd 324mg  ASA, 4x Nitro, 4mg  Zofran, and 566mL NS by EMS, who reported decr'd pain with nitro use.

## 2016-10-20 NOTE — ED Notes (Signed)
Pt departed in NAD, refused use of wheelchair.  

## 2016-10-20 NOTE — ED Provider Notes (Signed)
[name] Herring Provider Note   CSN: [id number] Arrival date & time: 10/20/16  0259  By signing my name below, I, Oleh Genin, attest that this documentation has been prepared under the direction and in the presence of Delora Fuel, MD. Electronically Signed: Oleh Genin, Scribe. 10/20/16. 3:22 AM.   History   Chief Complaint No chief complaint on file.   HPI KIA GUSH is a 56 y.o. male with history of schizophrenia, crack cocaine abuse in remission, HTN, and HIV on HAART last Cd4  470 in 06/2016 presents to the ED for evaluation of chest pain. This patient states that since 4 days ago he has experienced constant bilateral chest "sharpness" which is non-radiating, worse with respiration and changing position. He is also reporting a cough with post-tussive vomiting as well as sweating and subjective fevers. He has no other complaints at this time. Reports mother at 53 years of age who died of MI.  The history is provided by the patient. No language interpreter was used.    Past Medical History:  Diagnosis Date  . Depression   . HIV (human immunodeficiency virus infection) ([place])   . Hypertension   . Immune deficiency disorder ([place])   . Personality disorder   . Schizophrenia [facility])     Patient Active Problem List   Diagnosis Date Noted  . Acute respiratory failure with hypoxia ([place]) 07/17/2016  . CAP (community acquired pneumonia) 07/16/2016  . Cocaine dependence with cocaine-induced mood disorder ([place]) 03/29/2016  . HTN (hypertension) 08/25/2015  . Paranoid schizophrenia, chronic condition ([place]) 12/30/2014  . Cocaine use disorder, severe, dependence ([place]) 12/27/2014  . Alcohol use disorder, severe, dependence ([place]) 12/27/2014  . ERECTILE DYSFUNCTION 09/03/2007  . FOOT PAIN, BILATERAL 07/10/2007  . Human immunodeficiency virus (HIV) disease ([place]) 11/09/2006  . VIRAL MENINGITIS 11/09/2006  . THRUSH 11/09/2006  . CA IN SITU, RECTUM 11/09/2006  .  HYPOTHYROIDISM 11/09/2006  . DISORDER, BIPOLAR NOS 11/09/2006  . PYELONEPHRITIS 11/09/2006    No past surgical history on file.     Home Medications    Prior to Admission medications   Medication Sig Start Date End Date Taking? Authorizing Provider  benztropine (COGENTIN) 1 MG tablet Take 1 tablet (1 mg total) by mouth at bedtime. 03/30/16   Benjamine Mola, FNP  elvitegravir-cobicistat-emtricitabine-tenofovir (GENVOYA) 150-150-200-10 MG TABS tablet TAKE 1 TABLET BY MOUTH DAILY WITH BREAKFAST. Christena Flake [phone] to see Dr Johnnye Sima ASAP* 07/17/16   Janece Canterbury, MD  haloperidol decanoate (HALDOL DECANOATE) 50 MG/ML injection Inject 100 mg into the muscle every 28 (twenty-eight) days.    Historical Provider, MD  levofloxacin (LEVAQUIN) 750 MG tablet Take 1 tablet (750 mg total) by mouth daily. 07/17/16   Janece Canterbury, MD  risperiDONE (RISPERDAL) 2 MG tablet Take 1 tablet (2 mg total) by mouth 2 (two) times daily. 07/17/16   Janece Canterbury, MD    Family History Family History  Problem Relation Age of Onset  . CAD Mother   . Kidney disease Sister   . Lupus Brother   . Cancer Maternal Grandmother   . Stroke Paternal Grandmother     Social History Social History  Substance Use Topics  . Smoking status: Current Every Day Smoker    Packs/day: 1.00    Types: Cigarettes  . Smokeless tobacco: Never Used  . Alcohol use 0.0 oz/week     Comment: Daily. Last used: unknown time and amount     Allergies   Amoxicillin and Latex   Review of Systems Review  of Systems  Constitutional:       Subjective fevers. Sweating  Respiratory: Positive for cough.   Cardiovascular: Positive for chest pain.  All other systems reviewed and are negative.    Physical Exam Updated Vital Signs BP 118/71 (BP Location: Right Arm)   Pulse 72   Temp (!) 96.7 F (35.9 C) (Axillary)   SpO2 94%   Physical Exam  Constitutional: He is oriented to person, place, and time. He appears well-developed  and well-nourished.  HENT:  Head: Normocephalic and atraumatic.  Eyes: EOM are normal. Pupils are equal, round, and reactive to light.  Neck: Normal range of motion. Neck supple. No JVD present.  Cardiovascular: Normal rate, regular rhythm and normal heart sounds.   No murmur heard. Pulmonary/Chest: Effort normal and breath sounds normal. He has no wheezes. He has no rales.  There is tenderness to the bilateral chest wall  Abdominal: Soft. Bowel sounds are normal. He exhibits no distension and no mass. There is no tenderness.  Musculoskeletal: Normal range of motion. He exhibits no edema.  Lymphadenopathy:    He has no cervical adenopathy.  Neurological: He is alert and oriented to person, place, and time. No cranial nerve deficit. He exhibits normal muscle tone. Coordination normal.  Skin: Skin is warm and dry. No rash noted.  Psychiatric: He has a normal mood and affect. His behavior is normal. Judgment and thought content normal.  Nursing note and vitals reviewed.    ED Treatments / Results  DIAGNOSTIC STUDIES: Oxygen Saturation is 94 percent on room air which is normal by my interpretation.    COORDINATION OF CARE: 3:15 AM Discussed treatment plan with pt at bedside and pt agreed to plan.  Labs (all labs ordered are listed, but only abnormal results are displayed) Labs Reviewed  BASIC METABOLIC PANEL - Abnormal; Notable for the following:       Result Value   Potassium 2.9 (*)    Calcium 8.4 (*)    All other components within normal limits  CBC WITH DIFFERENTIAL/PLATELET - Abnormal; Notable for the following:    RBC 3.83 (*)    HCT 38.3 (*)    MCH 34.5 (*)    RDW 17.0 (*)    Platelets 79 (*)    All other components within normal limits  I-STAT TROPOININ, ED    EKG  EKG Interpretation  Date/Time:  Friday October 20 2016 02:59:06 EST Ventricular Rate:  75 PR Interval:    QRS Duration: 97 QT Interval:  422 QTC Calculation: 472 R Axis:   -2 Text Interpretation:   Sinus rhythm Normal ECG When compared with ECG of 07/16/2016, No significant change was found Confirmed by [facility]  MD, Klever Twyford ([id number]) on 10/20/2016 3:05:00 AM       Radiology Dg Chest 2 View  Result Date: 10/20/2016 CLINICAL DATA:  Initial evaluation for acute chest pain, diaphoresis. EXAM: CHEST  2 VIEW COMPARISON:  Prior radiograph from 07/15/2016. FINDINGS: Mild cardiomegaly, stable. Mediastinal silhouette within normal limits. Lungs normally inflated. There are patchy and hazy opacities within the right basilar region, which may reflect sequela of acute infectious or possibly aspiration pneumonitis. No other focal airspace disease. No pulmonary edema or pleural effusion. No pneumothorax. No acute osseus abnormality. IMPRESSION: Patchy and hazy right basilar opacities, which may reflect sequela of acute infectious or aspiration pneumonitis. Electronically Signed   By: Jeannine Boga M.D.   On: 10/20/2016 05:17    Procedures Procedures (including critical care time)  Medications Ordered in  ED Medications  potassium chloride SA (K-DUR,KLOR-CON) CR tablet 40 mEq (not administered)  ondansetron (ZOFRAN-ODT) disintegrating tablet 8 mg (8 mg Oral Given 10/20/16 0406)  ketorolac (TORADOL) injection 60 mg (60 mg Intramuscular Given 10/20/16 0404)     Initial Impression / Assessment and Plan / ED Course  I have reviewed the triage vital signs and the nursing notes.  Pertinent labs & imaging results that were available during my care of the patient were reviewed by me and considered in my medical decision making (see chart for details).  Chest pain-doubt serious illness. Patient is resting comfortably and in no acute distress. Old records are reviewed, and he has no relevant past visits. ECG is unremarkable and laboratory workup is unremarkable except for hypokalemia. Is given oral potassium. Chest x-ray is read as possibly showing sequelae of aspiration pneumonia. Patient does not clinically  have pneumonia today. He is given a dose of ketorolac with good relief of pain. He is discharged with prescription for K Dur.  Final Clinical Impressions(s) / ED Diagnoses   Final diagnoses:  Nonspecific chest pain  Hypokalemia    New Prescriptions New Prescriptions   POTASSIUM CHLORIDE SA (K-DUR,KLOR-CON) 20 MEQ TABLET    Take 1 tablet (20 mEq total) by mouth 3 (three) times daily.    I personally performed the services described in this documentation, which was scribed in my presence. The recorded information has been reviewed and is accurate.    Delora Fuel, MD [id number]

## 2016-10-20 NOTE — ED Notes (Signed)
Patient transported to CT 

## 2016-10-23 DIAGNOSIS — Z125 Encounter for screening for malignant neoplasm of prostate: Secondary | ICD-10-CM | POA: Diagnosis not present

## 2016-10-23 DIAGNOSIS — F200 Paranoid schizophrenia: Secondary | ICD-10-CM | POA: Diagnosis not present

## 2016-10-23 DIAGNOSIS — I10 Essential (primary) hypertension: Secondary | ICD-10-CM | POA: Diagnosis not present

## 2016-10-23 DIAGNOSIS — Z131 Encounter for screening for diabetes mellitus: Secondary | ICD-10-CM | POA: Diagnosis not present

## 2016-10-23 DIAGNOSIS — Z720 Tobacco use: Secondary | ICD-10-CM | POA: Diagnosis not present

## 2016-10-23 DIAGNOSIS — R5383 Other fatigue: Secondary | ICD-10-CM | POA: Diagnosis not present

## 2016-10-23 DIAGNOSIS — F39 Unspecified mood [affective] disorder: Secondary | ICD-10-CM | POA: Diagnosis not present

## 2016-10-25 ENCOUNTER — Other Ambulatory Visit: Payer: Self-pay | Admitting: Infectious Diseases

## 2016-10-25 DIAGNOSIS — B20 Human immunodeficiency virus [HIV] disease: Secondary | ICD-10-CM

## 2016-10-30 ENCOUNTER — Other Ambulatory Visit: Payer: Self-pay

## 2016-11-02 ENCOUNTER — Encounter (HOSPITAL_COMMUNITY): Payer: Self-pay

## 2016-11-02 ENCOUNTER — Emergency Department (HOSPITAL_COMMUNITY): Payer: Medicare Other

## 2016-11-02 ENCOUNTER — Inpatient Hospital Stay (HOSPITAL_COMMUNITY)
Admission: EM | Admit: 2016-11-02 | Discharge: 2016-11-07 | DRG: 166 | Disposition: A | Discharge status: Home Health | Payer: Medicare Other | Attending: Internal Medicine | Admitting: Internal Medicine

## 2016-11-02 DIAGNOSIS — R0902 Hypoxemia: Secondary | ICD-10-CM | POA: Diagnosis not present

## 2016-11-02 DIAGNOSIS — R262 Difficulty in walking, not elsewhere classified: Secondary | ICD-10-CM

## 2016-11-02 DIAGNOSIS — F200 Paranoid schizophrenia: Secondary | ICD-10-CM | POA: Diagnosis present

## 2016-11-02 DIAGNOSIS — J449 Chronic obstructive pulmonary disease, unspecified: Secondary | ICD-10-CM | POA: Diagnosis present

## 2016-11-02 DIAGNOSIS — D638 Anemia in other chronic diseases classified elsewhere: Secondary | ICD-10-CM | POA: Diagnosis present

## 2016-11-02 DIAGNOSIS — F1020 Alcohol dependence, uncomplicated: Secondary | ICD-10-CM | POA: Diagnosis present

## 2016-11-02 DIAGNOSIS — I82403 Acute embolism and thrombosis of unspecified deep veins of lower extremity, bilateral: Secondary | ICD-10-CM | POA: Diagnosis not present

## 2016-11-02 DIAGNOSIS — Z841 Family history of disorders of kidney and ureter: Secondary | ICD-10-CM

## 2016-11-02 DIAGNOSIS — R7989 Other specified abnormal findings of blood chemistry: Secondary | ICD-10-CM | POA: Diagnosis present

## 2016-11-02 DIAGNOSIS — Z880 Allergy status to penicillin: Secondary | ICD-10-CM

## 2016-11-02 DIAGNOSIS — I82401 Acute embolism and thrombosis of unspecified deep veins of right lower extremity: Secondary | ICD-10-CM

## 2016-11-02 DIAGNOSIS — I959 Hypotension, unspecified: Secondary | ICD-10-CM | POA: Diagnosis present

## 2016-11-02 DIAGNOSIS — F1021 Alcohol dependence, in remission: Secondary | ICD-10-CM | POA: Diagnosis present

## 2016-11-02 DIAGNOSIS — I10 Essential (primary) hypertension: Secondary | ICD-10-CM | POA: Diagnosis present

## 2016-11-02 DIAGNOSIS — I2699 Other pulmonary embolism without acute cor pulmonale: Principal | ICD-10-CM | POA: Diagnosis present

## 2016-11-02 DIAGNOSIS — Z91040 Latex allergy status: Secondary | ICD-10-CM | POA: Diagnosis not present

## 2016-11-02 DIAGNOSIS — Z832 Family history of diseases of the blood and blood-forming organs and certain disorders involving the immune mechanism: Secondary | ICD-10-CM

## 2016-11-02 DIAGNOSIS — I270 Primary pulmonary hypertension: Secondary | ICD-10-CM | POA: Diagnosis not present

## 2016-11-02 DIAGNOSIS — Z720 Tobacco use: Secondary | ICD-10-CM | POA: Diagnosis not present

## 2016-11-02 DIAGNOSIS — F1410 Cocaine abuse, uncomplicated: Secondary | ICD-10-CM | POA: Diagnosis present

## 2016-11-02 DIAGNOSIS — I82491 Acute embolism and thrombosis of other specified deep vein of right lower extremity: Secondary | ICD-10-CM | POA: Diagnosis present

## 2016-11-02 DIAGNOSIS — E876 Hypokalemia: Secondary | ICD-10-CM | POA: Diagnosis not present

## 2016-11-02 DIAGNOSIS — R740 Nonspecific elevation of levels of transaminase and lactic acid dehydrogenase [LDH]: Secondary | ICD-10-CM | POA: Diagnosis present

## 2016-11-02 DIAGNOSIS — F17210 Nicotine dependence, cigarettes, uncomplicated: Secondary | ICD-10-CM | POA: Diagnosis present

## 2016-11-02 DIAGNOSIS — R079 Chest pain, unspecified: Secondary | ICD-10-CM | POA: Diagnosis not present

## 2016-11-02 DIAGNOSIS — Z823 Family history of stroke: Secondary | ICD-10-CM | POA: Diagnosis not present

## 2016-11-02 DIAGNOSIS — I82409 Acute embolism and thrombosis of unspecified deep veins of unspecified lower extremity: Secondary | ICD-10-CM

## 2016-11-02 DIAGNOSIS — I82442 Acute embolism and thrombosis of left tibial vein: Secondary | ICD-10-CM | POA: Diagnosis present

## 2016-11-02 DIAGNOSIS — B20 Human immunodeficiency virus [HIV] disease: Secondary | ICD-10-CM | POA: Diagnosis not present

## 2016-11-02 DIAGNOSIS — Z8249 Family history of ischemic heart disease and other diseases of the circulatory system: Secondary | ICD-10-CM

## 2016-11-02 DIAGNOSIS — I82433 Acute embolism and thrombosis of popliteal vein, bilateral: Secondary | ICD-10-CM | POA: Diagnosis not present

## 2016-11-02 DIAGNOSIS — F1420 Cocaine dependence, uncomplicated: Secondary | ICD-10-CM | POA: Diagnosis present

## 2016-11-02 DIAGNOSIS — Z23 Encounter for immunization: Secondary | ICD-10-CM | POA: Diagnosis not present

## 2016-11-02 DIAGNOSIS — R0602 Shortness of breath: Secondary | ICD-10-CM | POA: Diagnosis not present

## 2016-11-02 DIAGNOSIS — I824Y9 Acute embolism and thrombosis of unspecified deep veins of unspecified proximal lower extremity: Secondary | ICD-10-CM | POA: Diagnosis not present

## 2016-11-02 LAB — URINALYSIS, ROUTINE W REFLEX MICROSCOPIC
Bacteria, UA: NONE SEEN
GLUCOSE, UA: NEGATIVE mg/dL
HGB URINE DIPSTICK: NEGATIVE
Ketones, ur: 5 mg/dL — AB
LEUKOCYTES UA: NEGATIVE
NITRITE: NEGATIVE
PH: 5 (ref 5.0–8.0)
Protein, ur: 30 mg/dL — AB
SPECIFIC GRAVITY, URINE: 1.041 — AB (ref 1.005–1.030)

## 2016-11-02 LAB — CBC WITH DIFFERENTIAL/PLATELET
BASOS ABS: 0 10*3/uL (ref 0.0–0.1)
Basophils Relative: 0 %
EOS PCT: 0 %
Eosinophils Absolute: 0 10*3/uL (ref 0.0–0.7)
HCT: 26.5 % — ABNORMAL LOW (ref 39.0–52.0)
Hemoglobin: 8.8 g/dL — ABNORMAL LOW (ref 13.0–17.0)
LYMPHS PCT: 26 %
Lymphs Abs: 1.4 10*3/uL (ref 0.7–4.0)
MCH: 33 pg (ref 26.0–34.0)
MCHC: 33.2 g/dL (ref 30.0–36.0)
MCV: 99.3 fL (ref 78.0–100.0)
MONO ABS: 0.6 10*3/uL (ref 0.1–1.0)
Monocytes Relative: 12 %
Neutro Abs: 3.3 10*3/uL (ref 1.7–7.7)
Neutrophils Relative %: 62 %
PLATELETS: 145 10*3/uL — AB (ref 150–400)
RBC: 2.67 MIL/uL — AB (ref 4.22–5.81)
RDW: 16.4 % — AB (ref 11.5–15.5)
WBC: 5.3 10*3/uL (ref 4.0–10.5)

## 2016-11-02 LAB — COMPREHENSIVE METABOLIC PANEL
ALT: 19 U/L (ref 17–63)
AST: 39 U/L (ref 15–41)
Albumin: 2.7 g/dL — ABNORMAL LOW (ref 3.5–5.0)
Alkaline Phosphatase: 44 U/L (ref 38–126)
Anion gap: 17 — ABNORMAL HIGH (ref 5–15)
BUN: 12 mg/dL (ref 6–20)
CHLORIDE: 104 mmol/L (ref 101–111)
CO2: 21 mmol/L — AB (ref 22–32)
Calcium: 9.1 mg/dL (ref 8.9–10.3)
Creatinine, Ser: 1.11 mg/dL (ref 0.61–1.24)
Glucose, Bld: 84 mg/dL (ref 65–99)
POTASSIUM: 3 mmol/L — AB (ref 3.5–5.1)
SODIUM: 142 mmol/L (ref 135–145)
Total Bilirubin: 0.7 mg/dL (ref 0.3–1.2)
Total Protein: 5.3 g/dL — ABNORMAL LOW (ref 6.5–8.1)

## 2016-11-02 LAB — I-STAT CG4 LACTIC ACID, ED
LACTIC ACID, VENOUS: 3.76 mmol/L — AB (ref 0.5–1.9)
Lactic Acid, Venous: 4.38 mmol/L (ref 0.5–1.9)

## 2016-11-02 LAB — I-STAT TROPONIN, ED: TROPONIN I, POC: 0 ng/mL (ref 0.00–0.08)

## 2016-11-02 LAB — LIPASE, BLOOD: LIPASE: 16 U/L (ref 11–51)

## 2016-11-02 LAB — ETHANOL: ALCOHOL ETHYL (B): 152 mg/dL — AB (ref <5)

## 2016-11-02 LAB — RAPID URINE DRUG SCREEN, HOSP PERFORMED
AMPHETAMINES: NOT DETECTED
BARBITURATES: NOT DETECTED
Benzodiazepines: NOT DETECTED
Cocaine: NOT DETECTED
Opiates: NOT DETECTED
Tetrahydrocannabinol: NOT DETECTED

## 2016-11-02 LAB — TROPONIN I: Troponin I: <0.03 ng/mL (ref <0.03)

## 2016-11-02 LAB — INFLUENZA PANEL BY PCR (TYPE A & B)
INFLBPCR: NEGATIVE
Influenza A By PCR: NEGATIVE

## 2016-11-02 LAB — D-DIMER, QUANTITATIVE: D-Dimer, Quant: 7.98 ug/mL-FEU — ABNORMAL HIGH (ref 0.00–0.50)

## 2016-11-02 MED ORDER — SODIUM CHLORIDE 0.9 % IV BOLUS (SEPSIS)
1000.0000 mL | Freq: Once | INTRAVENOUS | Status: AC
  Administered 2016-11-02: 1000 mL via INTRAVENOUS

## 2016-11-02 MED ORDER — ONDANSETRON HCL 4 MG/2ML IJ SOLN: 4.0000 mg | Freq: Four times a day (QID) | INTRAMUSCULAR | Status: DC | PRN

## 2016-11-02 MED ORDER — NICOTINE 21 MG/24HR TD PT24
21.0000 mg | MEDICATED_PATCH | Freq: Every day | TRANSDERMAL | Status: DC
  Administered 2016-11-02 – 2016-11-07 (×5): 21 mg via TRANSDERMAL
  Filled 2016-11-02 (×6): qty 1

## 2016-11-02 MED ORDER — ELVITEG-COBIC-EMTRICIT-TENOFAF 150-150-200-10 MG PO TABS
1.0000 | ORAL_TABLET | Freq: Every day | ORAL | Status: DC
  Administered 2016-11-03 – 2016-11-07 (×5): 1 via ORAL
  Filled 2016-11-02 (×5): qty 1

## 2016-11-02 MED ORDER — DEXTROSE 5 % IV SOLN
1.0000 g | Freq: Three times a day (TID) | INTRAVENOUS | Status: DC
  Administered 2016-11-02: 1 g via INTRAVENOUS
  Filled 2016-11-02 (×2): qty 1

## 2016-11-02 MED ORDER — MORPHINE SULFATE (PF) 4 MG/ML IV SOLN
2.0000 mg | INTRAVENOUS | Status: DC | PRN
  Administered 2016-11-02 – 2016-11-05 (×4): 2 mg via INTRAVENOUS
  Filled 2016-11-02 (×5): qty 1

## 2016-11-02 MED ORDER — VANCOMYCIN HCL IN DEXTROSE 1-5 GM/200ML-% IV SOLN
1000.0000 mg | Freq: Once | INTRAVENOUS | Status: DC
  Filled 2016-11-02: qty 200

## 2016-11-02 MED ORDER — PIPERACILLIN-TAZOBACTAM 3.375 G IVPB 30 MIN
3.3750 g | Freq: Once | INTRAVENOUS | Status: DC
  Filled 2016-11-02: qty 50

## 2016-11-02 MED ORDER — VITAMIN B-1 100 MG PO TABS
100.0000 mg | ORAL_TABLET | Freq: Every day | ORAL | Status: DC
  Administered 2016-11-02 – 2016-11-06 (×5): 100 mg via ORAL
  Filled 2016-11-02 (×4): qty 1

## 2016-11-02 MED ORDER — FOLIC ACID 1 MG PO TABS
1.0000 mg | ORAL_TABLET | Freq: Every day | ORAL | Status: DC
  Administered 2016-11-02 – 2016-11-07 (×6): 1 mg via ORAL
  Filled 2016-11-02 (×6): qty 1

## 2016-11-02 MED ORDER — GI COCKTAIL ~~LOC~~
30.0000 mL | Freq: Once | ORAL | Status: AC
  Administered 2016-11-02: 30 mL via ORAL
  Filled 2016-11-02: qty 30

## 2016-11-02 MED ORDER — LORAZEPAM 2 MG/ML IJ SOLN
0.0000 mg | Freq: Two times a day (BID) | INTRAMUSCULAR | Status: DC
Start: 2016-11-04 — End: 2016-11-06
  Filled 2016-11-02: qty 1

## 2016-11-02 MED ORDER — ADULT MULTIVITAMIN W/MINERALS CH
1.0000 | ORAL_TABLET | Freq: Every day | ORAL | Status: DC
  Administered 2016-11-03 – 2016-11-07 (×6): 1 via ORAL
  Filled 2016-11-02 (×6): qty 1

## 2016-11-02 MED ORDER — ACETAMINOPHEN 650 MG RE SUPP: 650.0000 mg | Freq: Four times a day (QID) | RECTAL | Status: DC | PRN

## 2016-11-02 MED ORDER — POTASSIUM CHLORIDE 20 MEQ/15ML (10%) PO SOLN
40.0000 meq | Freq: Once | ORAL | Status: AC
Start: 2016-11-02 — End: 2016-11-02
  Administered 2016-11-02: 40 meq via ORAL
  Filled 2016-11-02: qty 30

## 2016-11-02 MED ORDER — DM-GUAIFENESIN ER 30-600 MG PO TB12
1.0000 | ORAL_TABLET | Freq: Two times a day (BID) | ORAL | Status: DC
  Administered 2016-11-02 – 2016-11-07 (×10): 1 via ORAL
  Filled 2016-11-02 (×10): qty 1

## 2016-11-02 MED ORDER — SODIUM CHLORIDE 0.9% FLUSH
3.0000 mL | Freq: Two times a day (BID) | INTRAVENOUS | Status: DC
  Administered 2016-11-02 – 2016-11-07 (×9): 3 mL via INTRAVENOUS

## 2016-11-02 MED ORDER — ONDANSETRON HCL 4 MG PO TABS
4.0000 mg | ORAL_TABLET | Freq: Four times a day (QID) | ORAL | Status: DC | PRN
  Administered 2016-11-03: 4 mg via ORAL
  Filled 2016-11-02: qty 1

## 2016-11-02 MED ORDER — ZOLPIDEM TARTRATE 5 MG PO TABS
5.0000 mg | ORAL_TABLET | Freq: Every evening | ORAL | Status: DC | PRN
  Administered 2016-11-06: 5 mg via ORAL
  Filled 2016-11-02: qty 1

## 2016-11-02 MED ORDER — LORAZEPAM 2 MG/ML IJ SOLN
0.0000 mg | Freq: Four times a day (QID) | INTRAMUSCULAR | Status: DC
  Administered 2016-11-03 – 2016-11-04 (×4): 2 mg via INTRAVENOUS
  Filled 2016-11-02 (×6): qty 1

## 2016-11-02 MED ORDER — HEPARIN (PORCINE) IN NACL 100-0.45 UNIT/ML-% IJ SOLN
1250.0000 [IU]/h | INTRAMUSCULAR | Status: DC
  Administered 2016-11-02: 1000 [IU]/h via INTRAVENOUS
  Administered 2016-11-03: 1250 [IU]/h via INTRAVENOUS
  Filled 2016-11-02 (×2): qty 250

## 2016-11-02 MED ORDER — ALBUTEROL SULFATE (2.5 MG/3ML) 0.083% IN NEBU: 2.5000 mg | INHALATION_SOLUTION | RESPIRATORY_TRACT | Status: DC | PRN

## 2016-11-02 MED ORDER — IOPAMIDOL (ISOVUE-370) INJECTION 76%
INTRAVENOUS | Status: AC
  Administered 2016-11-02: 100 mL
  Filled 2016-11-02: qty 100

## 2016-11-02 MED ORDER — VANCOMYCIN HCL IN DEXTROSE 750-5 MG/150ML-% IV SOLN
750.0000 mg | Freq: Two times a day (BID) | INTRAVENOUS | Status: DC
  Filled 2016-11-02: qty 150

## 2016-11-02 MED ORDER — HEPARIN BOLUS VIA INFUSION
4000.0000 [IU] | Freq: Once | INTRAVENOUS | Status: AC
  Administered 2016-11-02: 4000 [IU] via INTRAVENOUS
  Filled 2016-11-02: qty 4000

## 2016-11-02 MED ORDER — ACETAMINOPHEN 325 MG PO TABS: 650.0000 mg | ORAL_TABLET | Freq: Four times a day (QID) | ORAL | Status: DC | PRN

## 2016-11-02 MED ORDER — LORAZEPAM 1 MG PO TABS
1.0000 mg | ORAL_TABLET | Freq: Four times a day (QID) | ORAL | Status: AC | PRN
  Administered 2016-11-04 – 2016-11-05 (×2): 1 mg via ORAL
  Filled 2016-11-02 (×2): qty 1

## 2016-11-02 MED ORDER — THIAMINE HCL 100 MG/ML IJ SOLN: 100.0000 mg | Freq: Every day | INTRAMUSCULAR | Status: DC

## 2016-11-02 MED ORDER — VANCOMYCIN HCL IN DEXTROSE 750-5 MG/150ML-% IV SOLN: 750.0000 mg | Freq: Two times a day (BID) | INTRAVENOUS | Status: DC

## 2016-11-02 MED ORDER — SODIUM CHLORIDE 0.9 % IV SOLN
INTRAVENOUS | Status: DC
  Administered 2016-11-02 – 2016-11-05 (×4): via INTRAVENOUS

## 2016-11-02 MED ORDER — LORAZEPAM 2 MG/ML IJ SOLN: 1.0000 mg | Freq: Four times a day (QID) | INTRAMUSCULAR | Status: AC | PRN

## 2016-11-02 MED ORDER — OXYCODONE-ACETAMINOPHEN 5-325 MG PO TABS
1.0000 | ORAL_TABLET | ORAL | Status: DC | PRN
  Administered 2016-11-05 – 2016-11-07 (×2): 1 via ORAL
  Filled 2016-11-02 (×2): qty 1

## 2016-11-02 NOTE — ED Notes (Signed)
Patient transported to CT 

## 2016-11-02 NOTE — Progress Notes (Signed)
ANTICOAGULATION CONSULT NOTE - Initial Consult  Pharmacy Consult for heparin Indication: pulmonary embolus  Allergies  Allergen Reactions  . Amoxicillin Shortness Of Breath and Rash    Has patient had a PCN reaction causing immediate rash, facial/tongue/throat swelling, [name] Has patient had a PCN reaction causing severe rash involving mucus membranes or skin necrosis: no Has patient had a PCN reaction that required hospitalization: yes drs office visit Has patient had a PCN reaction occurring within the last 10 years: no If all of the above answers are "NO", then may proceed with Cephalosporin use.   . Latex Shortness Of Breath    Patient Measurements: Height: 5\' 10"  (177.8 cm) Weight: 139 lb (63 kg) IBW/kg (Calculated) : 73 Heparin Dosing Weight: 63 kg  Vital Signs: Temp: 97.8 F (36.6 C) (02/08 1817) Temp Source: Oral (02/08 1817) BP: 106/71 (02/08 1830) Pulse Rate: 66 (02/08 1830)  Labs:  Recent Labs  11/02/16 1706  HGB 8.8*  HCT 26.5*  PLT 145*  CREATININE 1.11    Estimated Creatinine Clearance: 67.1 mL/min (by C-G formula based on SCr of 1.11 mg/dL).   Medical History: Past Medical History:  Diagnosis Date  . Depression   . HIV (human immunodeficiency virus infection) ([place])   . Hypertension   . Immune deficiency disorder ([place])   . Personality disorder   . Schizophrenia ([place])     Assessment: 2 yoM presents with CP found to have extensive bilateral PE noted on CT w/ R heart strain. No anticoagulant noted PTA. Hgb low, no S/Sx bleeding noted.   Goal of Therapy:  Heparin level 0.3-0.7 units/ml Monitor platelets by anticoagulation protocol: Yes   Plan:  -Heparin 4000 units x1 -Heparin 1000 units/hr -Follow-up 6-hr heparin level -Monitor heparin level, CBC, S/Sx bleeding daily  Arrie Senate, PharmD PGY-1 Pharmacy Resident Pager: [phone] 11/02/2016

## 2016-11-02 NOTE — ED Triage Notes (Signed)
Pt presents to the ed with chest pain for 1 week, within this last hour it has gotten worse so he called ems, on ems arrival he was hypotensive systolic in the 99991111 with a fever of 100.7, heart rate 88, with etoh on board.  Ems gave him 400 ml NS, 1000mg  of tylenol, and 324 aspirin.  On arrival to er the patient is alert and oriented vs wnl.

## 2016-11-02 NOTE — H&P (Signed)
History and Physical    [name] Herring [id number] DOB: Sep 22, 1961 DOA: 11/02/2016  Referring MD/NP/PA:   PCP: No PCP Per Patient   Patient coming from:  The patient is coming from home.  At baseline, pt is independent for most of ADL.   Chief Complaint: chest pain  HPI: [name] Herring is a 56 y.o. male with medical history significant of hypertension, schizophrenia, personality disorder, depression, HIV (CD4 67 and VL<20 on 07/16/16), tobacco abuse, cocaine abuse, alcohol abuse, who presents with chest pain.  Patient states that he has been having chest pain for about 1 week. It is located in the substernal area, take 10 in severity, radiating to the neck, pleuritic, aggravated by cough or deep breath. He has mild SOB. No calf tenderness. He has dry cough, but no fever or chills. Patient does not have flu symptoms. Denies nausea, vomiting, diarrhea, abdominal pain, symptoms of UTI or unilateral weakness. Per report, on EMS arrival he was hypotensive with systolic bp in the 99991111 with fever of 100.7, heart rate 88, with etoh on board.   ED Course: pt was found to have positive d-dimer 7.98, lactate 4.38, WBC 5.3, potassium is 3.0, creatinine 1.11, no tachycardia, oxygen saturation 93% on room air, chest x-ray showed interstitial coarsing and vascular congestion. CTA of chest showed bilateral submassive PE with evidence of right heart strain. Pt is admitted to SDU as inpt. IV heparin was started.  Review of Systems:   General: denies subjective fevers, chills, no changes in body weight, has poor appetite, has fatigue HEENT: no blurry vision, hearing changes or sore throat Respiratory: has dyspnea, coughing, no wheezing CV: no chest pain, no palpitations GI: no nausea, vomiting, abdominal pain, diarrhea, constipation GU: no dysuria, burning on urination, increased urinary frequency, hematuria  Ext: no leg edema Neuro: no unilateral weakness, numbness, or tingling, no vision change or  hearing loss Skin: no rash, no skin tear. MSK: No muscle spasm, no deformity, no limitation of range of movement in spin Heme: No easy bruising.  Travel history: No recent long distant travel.  Allergy:  Allergies  Allergen Reactions  . Amoxicillin Shortness Of Breath and Rash    Has patient had a PCN reaction causing immediate rash, facial/tongue/throat swelling, [name] Has patient had a PCN reaction causing severe rash involving mucus membranes or skin necrosis: no Has patient had a PCN reaction that required hospitalization: yes drs office visit Has patient had a PCN reaction occurring within the last 10 years: no If all of the above answers are "NO", then may proceed with Cephalosporin use.   . Latex Shortness Of Breath    Past Medical History:  Diagnosis Date  . Depression   . HIV (human immunodeficiency virus infection) ([place])   . Hypertension   . Immune deficiency disorder ([place])   . Personality disorder   . Schizophrenia ([place])     History reviewed. No pertinent surgical history.  Social History:  reports that he has been smoking Cigarettes.  He has been smoking about 1.00 pack per day. He has never used smokeless tobacco. He reports that he drinks alcohol. He reports that he uses drugs, including Cocaine.  Family History:  Family History  Problem Relation Age of Onset  . CAD Mother   . Kidney disease Sister   . Lupus Brother   . Cancer Maternal Grandmother   . Stroke Paternal Grandmother      Prior to Admission medications   Medication Sig  Start Date End Date Taking? Authorizing Provider  benztropine (COGENTIN) 1 MG tablet Take 1 tablet (1 mg total) by mouth at bedtime. Patient not taking: Reported on 10/20/2016 03/30/16   Benjamine Mola, FNP  elvitegravir-cobicistat-emtricitabine-tenofovir (GENVOYA) 150-150-200-10 MG TABS tablet TAKE 1 TABLET BY MOUTH DAILY WITH BREAKFAST. Christena Flake [phone] to see Dr Johnnye Sima ASAP* 07/17/16    Janece Canterbury, MD  potassium chloride SA (K-DUR,KLOR-CON) 20 MEQ tablet Take 1 tablet (20 mEq total) by mouth 3 (three) times daily. [id number]   Delora Fuel, MD  risperiDONE (RISPERDAL) 2 MG tablet Take 1 tablet (2 mg total) by mouth 2 (two) times daily. Patient not taking: Reported on 10/20/2016 07/17/16   Janece Canterbury, MD    Physical Exam: Vitals:   11/02/16 2215 11/02/16 2230 11/02/16 2254 11/03/16 0021  BP: 104/74 100/81 119/81 (!) 133/95  Pulse: (!) 59 84 74 63  Resp: 21 21 19  (!) 21  Temp:   97.5 F (36.4 C) 97.8 F (36.6 C)  TempSrc:   Oral Oral  SpO2: 94% 91% 94% 95%  Weight:   63.4 kg (139 lb 12.4 oz)   Height:   5\' 10"  (1.778 m)    General: Not in acute distress HEENT:       Eyes: PERRL, EOMI, no scleral icterus.       ENT: No discharge from the ears and nose, no pharynx injection, no tonsillar enlargement.        Neck: No JVD, no bruit, no mass felt. Heme: No neck lymph node enlargement. Cardiac: S1/S2, RRR, No murmurs, No gallops or rubs. Respiratory: No rales, wheezing, rhonchi or rubs. GI: Soft, nondistended, nontender, no rebound pain, no organomegaly, BS present. GU: No hematuria Ext: No pitting leg edema bilaterally. 2+DP/PT pulse bilaterally. Musculoskeletal: No joint deformities, No joint redness or warmth, no limitation of ROM in spin. Skin: No rashes.  Neuro: Alert, oriented X3, cranial nerves II-XII grossly intact, moves all extremities normally. Psych: Patient is not psychotic, no suicidal or hemocidal ideation.  Labs on Admission: I have personally reviewed following labs and imaging studies  CBC:  Recent Labs Lab 11/02/16 1706  WBC 5.3  NEUTROABS 3.3  HGB 8.8*  HCT 26.5*  MCV 99.3  PLT [id number]*   Basic Metabolic Panel:  Recent Labs Lab 11/02/16 1706  NA 142  K 3.0*  CL 104  CO2 21*  GLUCOSE 84  BUN 12  CREATININE 1.11  CALCIUM 9.1   GFR: Estimated Creatinine Clearance: 67.4 mL/min (by C-G formula based on SCr of 1.11  mg/dL). Liver Function Tests:  Recent Labs Lab 11/02/16 1706  AST 39  ALT 19  ALKPHOS 44  BILITOT 0.7  PROT 5.3*  ALBUMIN 2.7*    Recent Labs Lab 11/02/16 1706  LIPASE 16   No results for input(s): AMMONIA in the last 168 hours. Coagulation Profile: No results for input(s): INR, PROTIME in the last 168 hours. Cardiac Enzymes:  Recent Labs Lab 11/02/16 2043  TROPONINI <0.03   BNP (last 3 results) No results for input(s): PROBNP in the last 8760 hours. HbA1C: No results for input(s): HGBA1C in the last 72 hours. CBG: No results for input(s): GLUCAP in the last 168 hours. Lipid Profile: No results for input(s): CHOL, HDL, LDLCALC, TRIG, CHOLHDL, LDLDIRECT in the last 72 hours. Thyroid Function Tests: No results for input(s): TSH, T4TOTAL, FREET4, T3FREE, THYROIDAB in the last 72 hours. Anemia Panel: No results for input(s): VITAMINB12, FOLATE, FERRITIN, TIBC, IRON, RETICCTPCT in the last 72 hours. Urine  analysis:    Component Value Date/Time   COLORURINE AMBER (A) 11/02/2016 2049   APPEARANCEUR HAZY (A) 11/02/2016 2049   LABSPEC 1.041 (H) 11/02/2016 2049   PHURINE 5.0 11/02/2016 2049   GLUCOSEU NEGATIVE 11/02/2016 2049   GLUCOSEU NEG mg/dL 10/11/2006 2016   HGBUR NEGATIVE 11/02/2016 2049   BILIRUBINUR SMALL (A) 11/02/2016 2049   KETONESUR 5 (A) 11/02/2016 2049   PROTEINUR 30 (A) 11/02/2016 2049   UROBILINOGEN 1 10/11/2006 2016   NITRITE NEGATIVE 11/02/2016 2049   LEUKOCYTESUR NEGATIVE 11/02/2016 2049   Sepsis Labs: @LABRCNTIP (procalcitonin:4,lacticidven:4) )No results found for this or any previous visit (from the past 240 hour(s)).   Radiological Exams on Admission: Dg Chest 2 View  Result Date: 11/02/2016 CLINICAL DATA:  Generalized chest pain and shortness of breath. EXAM: CHEST  2 VIEW COMPARISON:  10/20/2016 FINDINGS: The lungs are clear wiithout focal pneumonia, edema, pneumothorax or pleural effusion. Interstitial markings are diffusely coarsened  with chronic features. The cardio pericardial silhouette is enlarged. There is pulmonary vascular congestion without overt pulmonary edema. IMPRESSION: Cardiomegaly with vascular congestion. Chronic interstitial coarsening. Electronically Signed   By: Misty Stanley M.D.   On: 11/02/2016 18:14   Ct Angio Chest Pe W And/or Wo Contrast  Result Date: 11/02/2016 CLINICAL DATA:  Chest pain for 6 months, worsening for a few days. History of hypertension, HIV, pneumonia. EXAM: CT ANGIOGRAPHY CHEST WITH CONTRAST TECHNIQUE: Multidetector CT imaging of the chest was performed using the standard protocol during bolus administration of intravenous contrast. Multiplanar CT image reconstructions and MIPs were obtained to evaluate the vascular anatomy. CONTRAST:  100 cc Isovue 370 COMPARISON:  Chest radiograph November 02, 2016 at 7:59 hours. FINDINGS: CARDIOVASCULAR: Adequate contrast opacification of the pulmonary artery's. Main pulmonary artery is mildly enlarged at 3.3 cm. LEFT upper lobe segmental to subsegmental occlusive pulmonary embolus. LEFT lower lobe nonocclusive segmental the subsegmental central filling defects. Large central embolus RIGHT middle lobe pulmonary artery, occlusive at the segmental level. Larger RIGHT lower lobar pulmonary embolus casting into the segmental and subsegmental pulmonary arteries, occlusive distally. Heart size is mildly enlarged. RIGHT heart strain (RV/ LV 1.1) No pericardial effusions. Thoracic aorta is normal course and caliber, mild calcific atherosclerosis of the aortic arch. Mild coronary artery calcifications. MEDIASTINUM/NODES: No lymphadenopathy by CT size criteria. LUNGS/PLEURA: Tracheobronchial tree is patent, no pneumothorax. Severe centrilobular emphysema. Dependent atelectasis without pleural effusion or focal consolidation. UPPER ABDOMEN: Included view of the abdomen is unremarkable. MUSCULOSKELETAL: Visualized soft tissues and included osseous structures appear normal.  Review of the MIP images confirms the above findings. IMPRESSION: Extensive bilateral acute pulmonary emboli, including occlusive emboli. CT evidence of right heart strain (RV/LV Ratio = 1.1) consistent with at least submassive (intermediate risk) PE. The presence of right heart strain has been associated with an increased risk of morbidity and mortality. Please activate Code PE by paging [phone]. Mild cardiomegaly.  Pulmonary vascular congestion. Severe COPD. Critical Value/emergent results were called by telephone at the time of interpretation on 11/02/2016 at 7:36 pm to Dr. Nanda Quinton , who verbally acknowledged these results. Electronically Signed   By: Elon Alas M.D.   On: 11/02/2016 19:38     EKG: Independently reviewed.  Sinus rhythm, QTC 467, nonspecific T-wave change.  Assessment/Plan Principal Problem:   PE (pulmonary thromboembolism) (H[facility]) Active Problems:   Human immunodeficiency virus (HIV) disease (H[facility])   Cocaine use disorder, severe, dependence ([facility])   Alcohol use disorder, severe, dependence ([place])   Paranoid schizophrenia, chronic condition ([place])  Tobacco abuse   Hypotension   Hypokalemia   Elevated lactic acid level   PE (pulmonary thromboembolism) ([place]): CTA of chest showed bilateral submassive PE with evidence of right heart strain. Patient had hypotension, blood pressure stabilized with IV fluids. Currently hemodynamically stable.  -admit to stepdown as inpt -heparin drip initiated -2D echocardiogram ordered -LE dopplers ordered to evaluate for DVT -repeat EKG in a.m. -trop x 3, BNP -pain control: When necessary Percocet and morphine  Human immunodeficiency virus (HIV) disease ([place]): CD4 470 and VL<20 on 07/16/16. -continue home HIV meds  Tobacco abuse and Alcohol abuse: -Did counseling about importance of quitting smoking -Nicotine patch -Did counseling about the importance of quitting drinking -CIWA protocol  Paranoid schizophrenia, chronic  condition and depression:  No SI or HI. -continue Remeron  Hypotension: Stabilized with IV fluid. Currently the blood pressure 160/71 -IV fluids: Normal saline 3 L, then 100 mL per hour  Hypokalemia: K= 3.0 on admission. - Repleted - Check Mg level  Elevated lactic acid level: Lactic acid 4.38, which is normalized with IV fluid. Most likely due to episode of hypotension. Patient does not have fever or leukocytosis, clinically does not seem to have infection. Patient received one dose of cefepime IV ED, will discontinue. --IV fluids: Normal saline 3 L, then 100 mL per hour    DVT ppx: on IV Heparin  Code Status: Full code Family Communication: None at bed side. Disposition Plan:  Anticipate discharge back to previous home environment Consults called: none   Admission status: SDU/inpation       Date of Service 11/03/2016    Ivor Costa [facility] Pager [phone]  If 7PM-7AM, please contact night-coverage [email or web address] Password [email or web address] 11/03/2016, 12:28 AM

## 2016-11-02 NOTE — ED Provider Notes (Signed)
[facility] DEPT Provider Note   CSN: [id number] Arrival date & time: 11/02/16  1617     History   Chief Complaint Chief Complaint  Patient presents with  . Chest Pain    HPI [name] Herring is a 56 y.o. male.  HPI   Patient is a 56 year old male with history of HIV, hypertension and schizophrenia who presents to the ED via EMS with complaint of chest pain. Patient reports over the past week he has had constant waxing and waning left chest pressure which she describes as sharp burning pain. Patient reports the pain radiates to his left shoulder. He reports the pain is aggravated with coughing, breathing or movement. Denies taking any medications for his symptoms at home. Denies history of similar symptoms. Patient also reports over the past week he has had associated chills, generalized body ache, nasal congestion, rhinorrhea, sore throat, SOB and nonproductive cough. Patient reports drinking "3 glasses of vodka" earlier this afternoon. He reports only drinking approximately once per month and states last time he had anything to drink was one month ago. Denies any drug use. Patient notes over the past 6 months he has had abdominal pain with associated nausea, NBNB vomiting and nonbloody diarrhea. He also reports having chronic swelling to his lower legs which is unchanged. He notes he has been taking his HIV medications as prescribed and states he has a follow-up appointment with his ID doctor on 2/19. Patient denies personal history of cardiac disease but endorses positive family history of cardiac disease (mother). Reports smoking 1 pack per day.  ID- Dr. Johnnye Sima  Past Medical History:  Diagnosis Date  . Depression   . HIV (human immunodeficiency virus infection) ([place])   . Hypertension   . Immune deficiency disorder ([place])   . Personality disorder   . Schizophrenia [facility])     Patient Active Problem List   Diagnosis Date Noted  . Acute respiratory failure with hypoxia ([place])  07/17/2016  . CAP (community acquired pneumonia) 07/16/2016  . Cocaine dependence with cocaine-induced mood disorder ([place]) 03/29/2016  . HTN (hypertension) 08/25/2015  . Paranoid schizophrenia, chronic condition ([place]) 12/30/2014  . Cocaine use disorder, severe, dependence ([place]) 12/27/2014  . Alcohol use disorder, severe, dependence ([place]) 12/27/2014  . ERECTILE DYSFUNCTION 09/03/2007  . FOOT PAIN, BILATERAL 07/10/2007  . Human immunodeficiency virus (HIV) disease ([place]) 11/09/2006  . VIRAL MENINGITIS 11/09/2006  . THRUSH 11/09/2006  . CA IN SITU, RECTUM 11/09/2006  . HYPOTHYROIDISM 11/09/2006  . DISORDER, BIPOLAR NOS 11/09/2006  . PYELONEPHRITIS 11/09/2006    History reviewed. No pertinent surgical history.     Home Medications    Prior to Admission medications   Medication Sig Start Date End Date Taking? Authorizing Provider  benztropine (COGENTIN) 1 MG tablet Take 1 tablet (1 mg total) by mouth at bedtime. Patient not taking: Reported on 10/20/2016 03/30/16   Benjamine Mola, FNP  elvitegravir-cobicistat-emtricitabine-tenofovir (GENVOYA) 150-150-200-10 MG TABS tablet TAKE 1 TABLET BY MOUTH DAILY WITH BREAKFAST. Christena Flake [phone] to see Dr Johnnye Sima ASAP* 07/17/16   Janece Canterbury, MD  potassium chloride SA (K-DUR,KLOR-CON) 20 MEQ tablet Take 1 tablet (20 mEq total) by mouth 3 (three) times daily. [id number]   Delora Fuel, MD  risperiDONE (RISPERDAL) 2 MG tablet Take 1 tablet (2 mg total) by mouth 2 (two) times daily. Patient not taking: Reported on 10/20/2016 07/17/16   Janece Canterbury, MD    Family History Family History  Problem Relation Age of Onset  . CAD Mother   .  Kidney disease Sister   . Lupus Brother   . Cancer Maternal Grandmother   . Stroke Paternal Grandmother     Social History Social History  Substance Use Topics  . Smoking status: Current Every Day Smoker    Packs/day: 1.00    Types: Cigarettes  . Smokeless tobacco: Never Used  . Alcohol use 0.0 oz/week      Comment: Daily. Last used: unknown time and amount     Allergies   Amoxicillin and Latex   Review of Systems Review of Systems  Constitutional: Positive for chills.  HENT: Positive for congestion, rhinorrhea and sore throat.   Respiratory: Positive for cough and shortness of breath.   Cardiovascular: Positive for chest pain.  Gastrointestinal: Positive for abdominal pain and nausea.  Musculoskeletal: Positive for myalgias (generalized).  Neurological: Positive for weakness.  All other systems reviewed and are negative.    Physical Exam Updated Vital Signs BP 106/71   Pulse 66   Temp 97.8 F (36.6 C) (Oral)   Resp 21   Ht 5\' 10"  (1.778 m)   Wt 63 kg   SpO2 95%   BMI 19.94 kg/m   Physical Exam  Constitutional: He is oriented to person, place, and time. He appears well-developed and well-nourished. No distress.  Thin, ill appearing male  HENT:  Head: Normocephalic and atraumatic.  Right Ear: Tympanic membrane normal.  Left Ear: Tympanic membrane normal.  Nose: Nose normal.  Mouth/Throat: Uvula is midline and oropharynx is clear and moist. Mucous membranes are dry. No oropharyngeal exudate, posterior oropharyngeal edema, posterior oropharyngeal erythema or tonsillar abscesses. No tonsillar exudate.  Eyes: Conjunctivae and EOM are normal. Pupils are equal, round, and reactive to light. Right eye exhibits no discharge. Left eye exhibits no discharge. No scleral icterus.  Neck: Normal range of motion. Neck supple.  Cardiovascular: Normal rate, regular rhythm, normal heart sounds and intact distal pulses.   Pulmonary/Chest: Effort normal. No respiratory distress. He has no wheezes. He has rales. He exhibits tenderness (mild TTP over left anterior chest wall). He exhibits no laceration, no crepitus, no edema, no deformity, no swelling and no retraction.  Mild rales in bilateral lower lobes  Abdominal: Soft. Bowel sounds are normal. He exhibits no distension and no mass. There  is tenderness. There is no rebound and no guarding. No hernia.  Mild diffuse abdominal tenderness  Musculoskeletal: Normal range of motion. He exhibits no edema.  Lymphadenopathy:    He has no cervical adenopathy.  Neurological: He is alert and oriented to person, place, and time.  Skin: Skin is warm and dry. No rash noted. He is not diaphoretic.  Nursing note and vitals reviewed.    ED Treatments / Results  Labs (all labs ordered are listed, but only abnormal results are displayed) Labs Reviewed  CBC WITH DIFFERENTIAL/PLATELET - Abnormal; Notable for the following:       Result Value   RBC 2.67 (*)    Hemoglobin 8.8 (*)    HCT 26.5 (*)    RDW 16.4 (*)    Platelets 145 (*)    All other components within normal limits  COMPREHENSIVE METABOLIC PANEL - Abnormal; Notable for the following:    Potassium 3.0 (*)    CO2 21 (*)    Total Protein 5.3 (*)    Albumin 2.7 (*)    Anion gap 17 (*)    All other components within normal limits  ETHANOL - Abnormal; Notable for the following:    Alcohol, Ethyl (B)  152 (*)    All other components within normal limits  D-DIMER, QUANTITATIVE (NOT AT [facility]) - Abnormal; Notable for the following:    D-Dimer, Quant 7.98 (*)    All other components within normal limits  I-STAT CG4 LACTIC ACID, ED - Abnormal; Notable for the following:    Lactic Acid, Venous 4.38 (*)    All other components within normal limits  CULTURE, BLOOD (ROUTINE X 2)  CULTURE, BLOOD (ROUTINE X 2)  LIPASE, BLOOD  URINALYSIS, ROUTINE W REFLEX MICROSCOPIC  RAPID URINE DRUG SCREEN, HOSP PERFORMED  INFLUENZA PANEL BY PCR (TYPE A & B)  HEPARIN LEVEL (UNFRACTIONATED)  CBC  I-STAT TROPOININ, ED  I-STAT CG4 LACTIC ACID, ED  I-STAT CG4 LACTIC ACID, ED    EKG  EKG Interpretation  Date/Time:  Thursday November 02 2016 16:18:39 EST Ventricular Rate:  87 PR Interval:    QRS Duration: 98 QT Interval:  388 QTC Calculation: 467 R Axis:   34 Text Interpretation:  Sinus rhythm  No STEMI. Similar to prior.  Confirmed by LONG MD, JOSHUA [phone]) on 11/02/2016 5:13:48 PM       Radiology Dg Chest 2 View  Result Date: 11/02/2016 CLINICAL DATA:  Generalized chest pain and shortness of breath. EXAM: CHEST  2 VIEW COMPARISON:  10/20/2016 FINDINGS: The lungs are clear wiithout focal pneumonia, edema, pneumothorax or pleural effusion. Interstitial markings are diffusely coarsened with chronic features. The cardio pericardial silhouette is enlarged. There is pulmonary vascular congestion without overt pulmonary edema. IMPRESSION: Cardiomegaly with vascular congestion. Chronic interstitial coarsening. Electronically Signed   By: Misty Stanley M.D.   On: 11/02/2016 18:14   Ct Angio Chest Pe W And/or Wo Contrast  Result Date: 11/02/2016 CLINICAL DATA:  Chest pain for 6 months, worsening for a few days. History of hypertension, HIV, pneumonia. EXAM: CT ANGIOGRAPHY CHEST WITH CONTRAST TECHNIQUE: Multidetector CT imaging of the chest was performed using the standard protocol during bolus administration of intravenous contrast. Multiplanar CT image reconstructions and MIPs were obtained to evaluate the vascular anatomy. CONTRAST:  100 cc Isovue 370 COMPARISON:  Chest radiograph November 02, 2016 at 7:59 hours. FINDINGS: CARDIOVASCULAR: Adequate contrast opacification of the pulmonary artery's. Main pulmonary artery is mildly enlarged at 3.3 cm. LEFT upper lobe segmental to subsegmental occlusive pulmonary embolus. LEFT lower lobe nonocclusive segmental the subsegmental central filling defects. Large central embolus RIGHT middle lobe pulmonary artery, occlusive at the segmental level. Larger RIGHT lower lobar pulmonary embolus casting into the segmental and subsegmental pulmonary arteries, occlusive distally. Heart size is mildly enlarged. RIGHT heart strain (RV/ LV 1.1) No pericardial effusions. Thoracic aorta is normal course and caliber, mild calcific atherosclerosis of the aortic arch. Mild  coronary artery calcifications. MEDIASTINUM/NODES: No lymphadenopathy by CT size criteria. LUNGS/PLEURA: Tracheobronchial tree is patent, no pneumothorax. Severe centrilobular emphysema. Dependent atelectasis without pleural effusion or focal consolidation. UPPER ABDOMEN: Included view of the abdomen is unremarkable. MUSCULOSKELETAL: Visualized soft tissues and included osseous structures appear normal. Review of the MIP images confirms the above findings. IMPRESSION: Extensive bilateral acute pulmonary emboli, including occlusive emboli. CT evidence of right heart strain (RV/LV Ratio = 1.1) consistent with at least submassive (intermediate risk) PE. The presence of right heart strain has been associated with an increased risk of morbidity and mortality. Please activate Code PE by paging [phone]. Mild cardiomegaly.  Pulmonary vascular congestion. Severe COPD. Critical Value/emergent results were called by telephone at the time of interpretation on 11/02/2016 at 7:36 pm to Dr. Nanda Quinton , who  verbally acknowledged these results. Electronically Signed   By: Elon Alas M.D.   On: 11/02/2016 19:38    Procedures .Critical Care Performed by: Nona Dell Authorized by: Nona Dell   Critical care provider statement:    Critical care time (minutes):  40   Critical care was necessary to treat or prevent imminent or life-threatening deterioration of the following conditions: Pulmonary emboli.   Critical care was time spent personally by me on the following activities:  Blood draw for specimens, development of treatment plan with patient or surrogate, discussions with consultants, evaluation of patient's response to treatment, examination of patient, interpretation of cardiac output measurements, obtaining history from patient or surrogate, ordering and performing treatments and interventions, ordering and review of laboratory studies, ordering and review of radiographic  studies, pulse oximetry, re-evaluation of patient's condition and review of old charts    (including critical care time)  Medications Ordered in ED Medications  vancomycin (VANCOCIN) IVPB 1000 mg/200 mL premix (not administered)  vancomycin (VANCOCIN) IVPB 750 mg/150 ml premix (not administered)  ceFEPIme (MAXIPIME) 1 g in dextrose 5 % 50 mL IVPB (1 g Intravenous New Bag/Given 11/02/16 1841)  heparin bolus via infusion 4,000 Units (not administered)  heparin ADULT infusion 100 units/mL (25000 units/270mL sodium chloride 0.45%) (not administered)  sodium chloride 0.9 % bolus 1,000 mL (1,000 mLs Intravenous New Bag/Given 11/02/16 1659)  gi cocktail (Maalox,Lidocaine,Donnatal) (30 mLs Oral Given 11/02/16 1835)  sodium chloride 0.9 % bolus 1,000 mL (1,000 mLs Intravenous New Bag/Given 11/02/16 1838)    And  sodium chloride 0.9 % bolus 1,000 mL (0 mLs Intravenous Stopped 11/02/16 1910)  iopamidol (ISOVUE-370) 76 % injection (100 mLs  Contrast Given 11/02/16 1908)     Initial Impression / Assessment and Plan / ED Course  I have reviewed the triage vital signs and the nursing notes.  Pertinent labs & imaging results that were available during my care of the patient were reviewed by me and considered in my medical decision making (see chart for details).    Patient presents with complaint of chest pain for the past week with associated chills, nasal congestion, rhinorrhea, cough and shortness of breath. Endorses drinking alcohol earlier today which worsened his chest pain. Denies personal history of cardiac disease, endorses positive family history. Patient reports smoking 1 pack per day. EMS report on their initial arrival patient with temp 100.7, heart rate 88, systolic blood pressure in the 80s. Patient given 400 mg normal saline, 1000 mg Tylenol and 425 aspirin in route. Initial vitals in the ED revealed temp 98.7, heart rate 80, BP 101/73, O2 saturation 95% on 2 L. Exam revealed mild rales in bibasilar  lobes, mild TTP over left anterior chest wall. Dry mucous membranes. Mild diffuse abdominal tenderness, no peritoneal signs. Patient given IV fluids and GI cocktail.  Chart review shows last HIV labs performed on 07/16/16 with viral load undetectable and CD4 count 470.  EKG showed sinus rhythm with no acute ischemic changes, no significant changes from prior. Troponin negative. Lactic acid 4.38, sepsis protocol initiated including broad spectrum abx for unknown source and IVF 30mg /kg. No leukocytosis. EtOH 152. D-dimer 7.98. CXR negative with no acute findings, no pneumonia. Will order CT angio for further evaluation, due to concern for PE. CT showed extensive bilateral acute pulmonary emboli with evidence of right heart strain. Orders placed for heparin. Discussed results and plan for admission with patient. Consulted hospitalist for admission. Dr. Blaine Hamper agrees to admission.  Final Clinical Impressions(s) /  ED Diagnoses   Final diagnoses:  Other acute pulmonary embolism without acute cor pulmonale [facility])    New Prescriptions New Prescriptions   No medications on file     Nona Dell, PA-C 11/02/16 2006    Margette Fast, MD 11/03/16 [phone]

## 2016-11-02 NOTE — ED Notes (Signed)
Placed pt on 2L [place]. Pt's SPO2 was 88% and declining.

## 2016-11-02 NOTE — Progress Notes (Signed)
Pharmacy Antibiotic Note  [name] Herring is a 56 y.o. male admitted on 11/02/2016 with sepsis. Pt has PMH HIV and presents with CP, fever, hypotension, and elevated lactate.  Pharmacy has been consulted for vancomycin and cefepime dosing. Pt has noted amoxicillin allergy but appears to have tolerated cephalosporins in the past.  Plan: -Vancomycin 1g IV x1 per MD then 750mg  IV q12h -Cefepime 1g IV q8h -Monitor renal function, cultures, LOT, vancomycin level as indicated   Height: 5\' 10"  (177.8 cm) Weight: 139 lb (63 kg) IBW/kg (Calculated) : 73  Temp (24hrs), Avg:98.7 F (37.1 C), Min:98.7 F (37.1 C), Max:98.7 F (37.1 C)   Recent Labs Lab 11/02/16 1706 11/02/16 1722  WBC 5.3  --   LATICACIDVEN  --  4.38*    Estimated Creatinine Clearance: 70.9 mL/min (by C-G formula based on SCr of 1.05 mg/dL).    Allergies  Allergen Reactions  . Amoxicillin Shortness Of Breath and Rash    Has patient had a PCN reaction causing immediate rash, facial/tongue/throat swelling, [name] Has patient had a PCN reaction causing severe rash involving mucus membranes or skin necrosis: no Has patient had a PCN reaction that required hospitalization: yes drs office visit Has patient had a PCN reaction occurring within the last 10 years: no If all of the above answers are "NO", then may proceed with Cephalosporin use.   . Latex Shortness Of Breath    Antimicrobials this admission: 2/7 Cefepime >>  2/7 Vancomycin >>   Dose adjustments this admission: none  Microbiology results: ordered  Thank you for allowing pharmacy to be a part of this patient's care.  Arrie Senate, PharmD PGY-1 Pharmacy Resident Pager: [phone] 11/02/2016

## 2016-11-02 NOTE — ED Notes (Signed)
Abnormal Lactic acid result of 4.56mmol/L shown to Dr. Laverta Baltimore

## 2016-11-03 ENCOUNTER — Inpatient Hospital Stay (HOSPITAL_COMMUNITY): Payer: Medicare Other

## 2016-11-03 ENCOUNTER — Encounter (HOSPITAL_COMMUNITY): Payer: Self-pay | Admitting: General Surgery

## 2016-11-03 DIAGNOSIS — F200 Paranoid schizophrenia: Secondary | ICD-10-CM

## 2016-11-03 DIAGNOSIS — I270 Primary pulmonary hypertension: Secondary | ICD-10-CM

## 2016-11-03 DIAGNOSIS — I959 Hypotension, unspecified: Secondary | ICD-10-CM

## 2016-11-03 DIAGNOSIS — B20 Human immunodeficiency virus [HIV] disease: Secondary | ICD-10-CM

## 2016-11-03 DIAGNOSIS — I2699 Other pulmonary embolism without acute cor pulmonale: Secondary | ICD-10-CM

## 2016-11-03 DIAGNOSIS — F1020 Alcohol dependence, uncomplicated: Secondary | ICD-10-CM

## 2016-11-03 DIAGNOSIS — Z720 Tobacco use: Secondary | ICD-10-CM

## 2016-11-03 DIAGNOSIS — E876 Hypokalemia: Secondary | ICD-10-CM

## 2016-11-03 DIAGNOSIS — I824Y9 Acute embolism and thrombosis of unspecified deep veins of unspecified proximal lower extremity: Secondary | ICD-10-CM

## 2016-11-03 DIAGNOSIS — R0902 Hypoxemia: Secondary | ICD-10-CM

## 2016-11-03 LAB — ECHOCARDIOGRAM COMPLETE
Height: 70 in
WEIGHTICAEL: 2236.35 [oz_av]

## 2016-11-03 LAB — BASIC METABOLIC PANEL
Anion gap: 7 (ref 5–15)
BUN: 10 mg/dL (ref 6–20)
CALCIUM: 8.5 mg/dL — AB (ref 8.9–10.3)
CHLORIDE: 113 mmol/L — AB (ref 101–111)
CO2: 24 mmol/L (ref 22–32)
CREATININE: 0.79 mg/dL (ref 0.61–1.24)
GFR calc non Af Amer: >60 mL/min (ref >60)
GLUCOSE: 85 mg/dL (ref 65–99)
Potassium: 4 mmol/L (ref 3.5–5.1)
Sodium: 144 mmol/L (ref 135–145)

## 2016-11-03 LAB — GLUCOSE, CAPILLARY: GLUCOSE-CAPILLARY: 81 mg/dL (ref 65–99)

## 2016-11-03 LAB — PROTIME-INR
INR: 1.25
PROTHROMBIN TIME: 15.8 s — AB (ref 11.4–15.2)

## 2016-11-03 LAB — IRON AND TIBC
Iron: 46 ug/dL (ref 45–182)
Saturation Ratios: 19 % (ref 17.9–39.5)
TIBC: 242 ug/dL — ABNORMAL LOW (ref 250–450)
UIBC: 196 ug/dL

## 2016-11-03 LAB — MAGNESIUM: Magnesium: 1.8 mg/dL (ref 1.7–2.4)

## 2016-11-03 LAB — CBC
HCT: 25.3 % — ABNORMAL LOW (ref 39.0–52.0)
Hemoglobin: 8.3 g/dL — ABNORMAL LOW (ref 13.0–17.0)
MCH: 33.1 pg (ref 26.0–34.0)
MCHC: 32.8 g/dL (ref 30.0–36.0)
MCV: 100.8 fL — AB (ref 78.0–100.0)
Platelets: 121 10*3/uL — ABNORMAL LOW (ref 150–400)
RBC: 2.51 MIL/uL — AB (ref 4.22–5.81)
RDW: 16.8 % — AB (ref 11.5–15.5)
WBC: 3 10*3/uL — AB (ref 4.0–10.5)

## 2016-11-03 LAB — TROPONIN I

## 2016-11-03 LAB — MRSA PCR SCREENING: MRSA by PCR: NEGATIVE

## 2016-11-03 LAB — FERRITIN: FERRITIN: 46 ng/mL (ref 24–336)

## 2016-11-03 LAB — HEPARIN LEVEL (UNFRACTIONATED)
HEPARIN UNFRACTIONATED: 0.33 [IU]/mL (ref 0.30–0.70)
Heparin Unfractionated: 0.17 IU/mL — ABNORMAL LOW (ref 0.30–0.70)
Heparin Unfractionated: 0.29 IU/mL — ABNORMAL LOW (ref 0.30–0.70)

## 2016-11-03 LAB — LACTIC ACID, PLASMA
LACTIC ACID, VENOUS: 1.7 mmol/L (ref 0.5–1.9)
Lactic Acid, Venous: 1 mmol/L (ref 0.5–1.9)

## 2016-11-03 LAB — FOLATE: Folate: 13.5 ng/mL (ref >5.9)

## 2016-11-03 LAB — VITAMIN B12: VITAMIN B 12: 487 pg/mL (ref 180–914)

## 2016-11-03 LAB — BRAIN NATRIURETIC PEPTIDE: B Natriuretic Peptide: 45.4 pg/mL (ref 0.0–100.0)

## 2016-11-03 MED ORDER — HEPARIN BOLUS VIA INFUSION
1000.0000 [IU] | INTRAVENOUS | Status: AC
  Administered 2016-11-03: 1000 [IU] via INTRAVENOUS
  Filled 2016-11-03: qty 1000

## 2016-11-03 MED ORDER — HEPARIN (PORCINE) IN NACL 100-0.45 UNIT/ML-% IJ SOLN
1400.0000 [IU]/h | INTRAMUSCULAR | Status: DC
  Administered 2016-11-04 – 2016-11-05 (×3): 1400 [IU]/h via INTRAVENOUS
  Filled 2016-11-03 (×3): qty 250

## 2016-11-03 MED ORDER — HEPARIN BOLUS VIA INFUSION
2000.0000 [IU] | Freq: Once | INTRAVENOUS | Status: AC
  Administered 2016-11-03: 2000 [IU] via INTRAVENOUS
  Filled 2016-11-03: qty 2000

## 2016-11-03 MED ORDER — IBUPROFEN 200 MG PO TABS: 400.0000 mg | ORAL_TABLET | Freq: Four times a day (QID) | ORAL | Status: DC | PRN

## 2016-11-03 MED ORDER — INFLUENZA VAC SPLIT QUAD 0.5 ML IM SUSY
0.5000 mL | PREFILLED_SYRINGE | INTRAMUSCULAR | Status: AC
  Administered 2016-11-04: 0.5 mL via INTRAMUSCULAR
  Filled 2016-11-03: qty 0.5

## 2016-11-03 MED ORDER — MIRTAZAPINE 15 MG PO TABS
15.0000 mg | ORAL_TABLET | Freq: Every day | ORAL | Status: DC
  Administered 2016-11-03 – 2016-11-06 (×4): 15 mg via ORAL
  Filled 2016-11-03 (×4): qty 1

## 2016-11-03 MED ORDER — MAGNESIUM OXIDE 400 (241.3 MG) MG PO TABS
400.0000 mg | ORAL_TABLET | Freq: Two times a day (BID) | ORAL | Status: DC
  Administered 2016-11-03 – 2016-11-07 (×9): 400 mg via ORAL
  Filled 2016-11-03 (×9): qty 1

## 2016-11-03 MED ORDER — GABAPENTIN 600 MG PO TABS
600.0000 mg | ORAL_TABLET | Freq: Every day | ORAL | Status: DC
  Administered 2016-11-03 – 2016-11-06 (×4): 600 mg via ORAL
  Filled 2016-11-03 (×4): qty 1

## 2016-11-03 NOTE — Progress Notes (Signed)
Echocardiogram 2D Echocardiogram has been performed.  [name] Herring 11/03/2016, 12:36 PM

## 2016-11-03 NOTE — Progress Notes (Signed)
ANTICOAGULATION CONSULT NOTE - Follow-up Consult  Pharmacy Consult for heparin Indication: pulmonary embolus  Allergies  Allergen Reactions  . Amoxicillin Shortness Of Breath and Rash    Has patient had a PCN reaction causing immediate rash, facial/tongue/throat swelling, [name] Has patient had a PCN reaction causing severe rash involving mucus membranes or skin necrosis: no Has patient had a PCN reaction that required hospitalization: yes drs office visit Has patient had a PCN reaction occurring within the last 10 years: no If all of the above answers are "NO", then may proceed with Cephalosporin use.   . Latex Shortness Of Breath    Patient Measurements: Height: 5\' 10"  (177.8 cm) Weight: 139 lb 12.4 oz (63.4 kg) IBW/kg (Calculated) : 73 Heparin Dosing Weight: 63 kg  Vital Signs: Temp: 97.8 F (36.6 C) (02/09 0021) Temp Source: Oral (02/09 0021) BP: 133/95 (02/09 0021) Pulse Rate: 63 (02/09 0021)  Labs:  Recent Labs  11/02/16 1706 11/02/16 2043 11/03/16 0157  HGB 8.8*  --  8.3*  HCT 26.5*  --  25.3*  PLT 145*  --  121*  HEPARINUNFRC  --   --  0.17*  CREATININE 1.11  --   --   TROPONINI  --  <0.03 <0.03    Estimated Creatinine Clearance: 67.4 mL/min (by C-G formula based on SCr of 1.11 mg/dL).  Assessment: 12 yoM presents with CP found to have extensive bilateral PE noted on CT w/ R heart strain. Heparin level subtherapeutic (0.17) on gtt at 1000 units/hr. Hgb, plt down a bit. No issues with infusion.  Goal of Therapy:  Heparin level 0.3-0.7 units/ml Monitor platelets by anticoagulation protocol: Yes   Plan:  -Rebolus heparin 2000 units x1 -Increase heparin to 1250 units/hr -Follow-up 6-hr heparin level  Sherlon Handing, PharmD, BCPS Clinical pharmacist, pager [phone] 11/03/2016

## 2016-11-03 NOTE — Progress Notes (Signed)
ANTICOAGULATION CONSULT NOTE - Follow-up Consult  Pharmacy Consult for heparin Indication: pulmonary embolus and acute DVT  Patient Measurements: Height: 5\' 10"  (177.8 cm) Weight: 139 lb 12.4 oz (63.4 kg) IBW/kg (Calculated) : 73 Heparin Dosing Weight: 63 kg  Vital Signs: Temp: 98.4 F (36.9 C) (02/09 1616) Temp Source: Oral (02/09 1616) BP: 139/89 (02/09 1800) Pulse Rate: 56 (02/09 1800)  Labs:  Recent Labs  11/02/16 1706 11/02/16 2043 11/03/16 0157 11/03/16 0838 11/03/16 0958 11/03/16 1347 11/03/16 1747  HGB 8.8*  --  8.3*  --   --   --   --   HCT 26.5*  --  25.3*  --   --   --   --   PLT 145*  --  121*  --   --   --   --   LABPROT  --   --   --   --   --  15.8*  --   INR  --   --   --   --   --  1.25  --   HEPARINUNFRC  --   --  0.17*  --  0.33  --  0.29*  CREATININE 1.11  --   --  0.79  --   --   --   TROPONINI  --  <0.03 <0.03 <0.03  --   --   --     Estimated Creatinine Clearance: 93.6 mL/min (by C-G formula based on SCr of 0.79 mg/dL).  Assessment: 45 yoM presents with CP found to have extensive bilateral PE and acute bilateral DVT. Heparin level decreased to 0.29 on 1250 units/hr IV heparin drip.  No issues with IV line and no bleeding per RN.   Hgb 8.3 < 8.8 (baseline ~ 13.2 on 10/20/16)   CTA PE 2/8 > Extensive bilateral PE, including occlusive emboli, RV/LV 1.1, Pulmonary vascular congestion, Severe COPD VAS [place] LE 2/9 > right acute DVT of Gastrocnemius vein, left positive for DVT of posterior tibial vein, also positive for an acute mobile thrombus of the proximal popliteal vein   Goal of Therapy:  Heparin level 0.3-0.7 units/ml Monitor platelets by anticoagulation protocol: Yes   Plan:  Give 1000 units heparin IV bolus x1  Increase heparin infusion to 1400 units/hr Heparin level in 6 hours Daily HL, CBC Monitor for sxs of bleeding  Nicole Cella, RPh Clinical Pharmacist [phone] or [phone] 11/03/2016, 7:02 PM

## 2016-11-03 NOTE — Progress Notes (Signed)
ANTICOAGULATION CONSULT NOTE - Follow-up Consult  Pharmacy Consult for heparin Indication: pulmonary embolus and acute DVT  Patient Measurements: Height: 5\' 10"  (177.8 cm) Weight: 139 lb 12.4 oz (63.4 kg) IBW/kg (Calculated) : 73 Heparin Dosing Weight: 63 kg  Vital Signs: Temp: 97.7 F (36.5 C) (02/09 0754) Temp Source: Oral (02/09 0754) BP: 116/86 (02/09 0800) Pulse Rate: 58 (02/09 0800)  Labs:  Recent Labs  11/02/16 1706 11/02/16 2043 11/03/16 0157 11/03/16 0838 11/03/16 0958  HGB 8.8*  --  8.3*  --   --   HCT 26.5*  --  25.3*  --   --   PLT 145*  --  121*  --   --   HEPARINUNFRC  --   --  0.17*  --  0.33  CREATININE 1.11  --   --  0.79  --   TROPONINI  --  <0.03 <0.03 <0.03  --     Estimated Creatinine Clearance: 93.6 mL/min (by C-G formula based on SCr of 0.79 mg/dL).  Assessment: 29 yoM presents with CP found to have extensive bilateral PE and acute DVT. Heparin level is now therapeutic at 0.33. Hgb 8.3 < 8.8 (baseline ~ 13.2 on 10/20/16)   Goal of Therapy:  Heparin level 0.3-0.7 units/ml Monitor platelets by anticoagulation protocol: Yes   Plan:  - Continue heparin infusion at 1250 units/hr - Confirmation heparin level in 6 hours - Monitor for sxs of bleeding  Vincenza Hews, PharmD, BCPS 11/03/2016, 12:01 PM

## 2016-11-03 NOTE — Progress Notes (Signed)
Pt UOP 120mL since 0700. Bladder scan detects 311mL. Provider made aware. No new orders received. Instructed just to monitor.

## 2016-11-03 NOTE — Progress Notes (Signed)
PROGRESS NOTE    [name] Herring  [id number] DOB: 1961/06/20 DOA: 11/02/2016 PCP: No PCP Per Patient    Brief Narrative:  Patient is a 56 year old gentleman history of hypertension, schizophrenia, depression, HIV, tobacco abuse, polysubstance abuse, alcohol abuse presented to the ED with pleuritic chest pain with associated shortness of breath. CT angiogram chest done was consistent with bilateral submassive PE with evidence of right heart strain. Patient also noted to be hypotensive on admission. Patient placed in the step down unit currently on IV heparin.   Assessment & Plan:   Principal Problem:   PE (pulmonary thromboembolism) ([place]) Active Problems:   Human immunodeficiency virus (HIV) disease (HCC)   Cocaine use disorder, severe, dependence (HCC)   Alcohol use disorder, severe, dependence ([place])   Paranoid schizophrenia, chronic condition ([place])   Tobacco abuse   Hypotension   Hypokalemia   Elevated lactic acid level  #1 bilateral submassive PE with evidence of right heart strain On admission patient was noted to be hypotensive with improvement with blood pressure with IV fluids. Patient currently drowsy. 2-D echo pending. Lower extremity Dopplers pending. Cardiac enzymes negative 2. Continue IV heparin. Likely transition to oral anticoagulation in the next 48-72 hours. Follow.  #2 HIV Last CD4 count 470 with a viral load is less than 20 on 07/16/2016. Continue home HIV medications.  #3 alcohol abuse Alcohol cessation. Patient currently on Ativan withdrawal protocol. Folic acid, thiamine, multivitamin. Follow.  #3 tobacco abuse Tobacco cessation. Nicotine patch.  #4 paranoid schizophrenia/depression No suicidal or homicidal ideation. Continue Remeron.  #5 hypotension Secondary to problem #1. Improved with IV fluids. Follow.  #6 hypokalemia Repleted. Magnesium at 1.8. Placed on magnesium oxide. Follow.  #7 anemia Patient with no overt bleeding. Anemia panel  consistent with anemia of chronic disease. Follow H&H. Transfusion threshold hemoglobin less than 7.    DVT prophylaxis: Heparin Code Status: Full Family Communication: No family at bedside. Disposition Plan: Home when medically stable.   Consultants:   None  Procedures:   CT angiogram chest 11/02/2016  Chest x-ray 11/02/2016  Antimicrobials:   None   Subjective: Patient sleeping drowsy opens eyes to verbal stimuli and drifts back off to sleep. Patient received Ativan early this morning secondary to agitation and probable withdrawal symptoms per nursing.  Objective: Vitals:   11/02/16 2254 11/03/16 0021 11/03/16 0350 11/03/16 0754  BP: 119/81 (!) 133/95 121/82 113/84  Pulse: 74 63 74 (!) 58  Resp: 19 (!) 21 16 17   Temp: 97.5 F (36.4 C) 97.8 F (36.6 C) 98.4 F (36.9 C) 97.7 F (36.5 C)  TempSrc: Oral Oral Oral Oral  SpO2: 94% 95% 97% 99%  Weight: 63.4 kg (139 lb 12.4 oz)     Height: 5\' 10"  (1.778 m)       Intake/Output Summary (Last 24 hours) at 11/03/16 0851 Last data filed at 11/03/16 0400  Gross per 24 hour  Intake           3604.5 ml  Output               50 ml  Net           3554.5 ml   Filed Weights   11/02/16 1622 11/02/16 2254  Weight: 63 kg (139 lb) 63.4 kg (139 lb 12.4 oz)    Examination:  General exam: Sleeping. Drowsy. Respiratory system: Clear to auscultation anterior lung fields. Respiratory effort normal. Cardiovascular system: S1 & S2 heard, RRR. No JVD, murmurs, rubs, gallops or clicks. No  pedal edema. Gastrointestinal system: Abdomen is nondistended, soft and nontender. No organomegaly or masses felt. Normal bowel sounds heard. Central nervous system: Asleep. Drowsy.  Extremities: Symmetric 5 x 5 power. Skin: No rashes, lesions or ulcers Psychiatry: Unable to assess.     Data Reviewed: I have personally reviewed following labs and imaging studies  CBC:  Recent Labs Lab 11/02/16 1706 11/03/16 0157  WBC 5.3 3.0*  NEUTROABS  3.3  --   HGB 8.8* 8.3*  HCT 26.5* 25.3*  MCV 99.3 100.8*  PLT 145* 123XX123*   Basic Metabolic Panel:  Recent Labs Lab 11/02/16 1706 11/03/16 0157  NA 142  --   K 3.0*  --   CL 104  --   CO2 21*  --   GLUCOSE 84  --   BUN 12  --   CREATININE 1.11  --   CALCIUM 9.1  --   MG  --  1.8   GFR: Estimated Creatinine Clearance: 67.4 mL/min (by C-G formula based on SCr of 1.11 mg/dL). Liver Function Tests:  Recent Labs Lab 11/02/16 1706  AST 39  ALT 19  ALKPHOS 44  BILITOT 0.7  PROT 5.3*  ALBUMIN 2.7*    Recent Labs Lab 11/02/16 1706  LIPASE 16   No results for input(s): AMMONIA in the last 168 hours. Coagulation Profile: No results for input(s): INR, PROTIME in the last 168 hours. Cardiac Enzymes:  Recent Labs Lab 11/02/16 2043 11/03/16 0157  TROPONINI <0.03 <0.03   BNP (last 3 results) No results for input(s): PROBNP in the last 8760 hours. HbA1C: No results for input(s): HGBA1C in the last 72 hours. CBG:  Recent Labs Lab 11/03/16 0752  GLUCAP 81   Lipid Profile: No results for input(s): CHOL, HDL, LDLCALC, TRIG, CHOLHDL, LDLDIRECT in the last 72 hours. Thyroid Function Tests: No results for input(s): TSH, T4TOTAL, FREET4, T3FREE, THYROIDAB in the last 72 hours. Anemia Panel: No results for input(s): VITAMINB12, FOLATE, FERRITIN, TIBC, IRON, RETICCTPCT in the last 72 hours. Sepsis Labs:  Recent Labs Lab 11/02/16 1722 11/02/16 2058 11/03/16 0157 11/03/16 0343  LATICACIDVEN 4.38* 3.76* 1.7 1.0    Recent Results (from the past 240 hour(s))  MRSA PCR Screening     Status: None   Collection Time: 11/02/16 10:50 PM  Result Value Ref Range Status   MRSA by PCR NEGATIVE NEGATIVE Final    Comment:        The GeneXpert MRSA Assay (FDA approved for NASAL specimens only), is one component of a comprehensive MRSA colonization surveillance program. It is not intended to diagnose MRSA infection nor to guide or monitor treatment for MRSA  infections.          Radiology Studies: Dg Chest 2 View  Result Date: 11/02/2016 CLINICAL DATA:  Generalized chest pain and shortness of breath. EXAM: CHEST  2 VIEW COMPARISON:  10/20/2016 FINDINGS: The lungs are clear wiithout focal pneumonia, edema, pneumothorax or pleural effusion. Interstitial markings are diffusely coarsened with chronic features. The cardio pericardial silhouette is enlarged. There is pulmonary vascular congestion without overt pulmonary edema. IMPRESSION: Cardiomegaly with vascular congestion. Chronic interstitial coarsening. Electronically Signed   By: Misty Stanley M.D.   On: 11/02/2016 18:14   Ct Angio Chest Pe W And/or Wo Contrast  Result Date: 11/02/2016 CLINICAL DATA:  Chest pain for 6 months, worsening for a few days. History of hypertension, HIV, pneumonia. EXAM: CT ANGIOGRAPHY CHEST WITH CONTRAST TECHNIQUE: Multidetector CT imaging of the chest was performed using the standard protocol  during bolus administration of intravenous contrast. Multiplanar CT image reconstructions and MIPs were obtained to evaluate the vascular anatomy. CONTRAST:  100 cc Isovue 370 COMPARISON:  Chest radiograph November 02, 2016 at 7:59 hours. FINDINGS: CARDIOVASCULAR: Adequate contrast opacification of the pulmonary artery's. Main pulmonary artery is mildly enlarged at 3.3 cm. LEFT upper lobe segmental to subsegmental occlusive pulmonary embolus. LEFT lower lobe nonocclusive segmental the subsegmental central filling defects. Large central embolus RIGHT middle lobe pulmonary artery, occlusive at the segmental level. Larger RIGHT lower lobar pulmonary embolus casting into the segmental and subsegmental pulmonary arteries, occlusive distally. Heart size is mildly enlarged. RIGHT heart strain (RV/ LV 1.1) No pericardial effusions. Thoracic aorta is normal course and caliber, mild calcific atherosclerosis of the aortic arch. Mild coronary artery calcifications. MEDIASTINUM/NODES: No lymphadenopathy  by CT size criteria. LUNGS/PLEURA: Tracheobronchial tree is patent, no pneumothorax. Severe centrilobular emphysema. Dependent atelectasis without pleural effusion or focal consolidation. UPPER ABDOMEN: Included view of the abdomen is unremarkable. MUSCULOSKELETAL: Visualized soft tissues and included osseous structures appear normal. Review of the MIP images confirms the above findings. IMPRESSION: Extensive bilateral acute pulmonary emboli, including occlusive emboli. CT evidence of right heart strain (RV/LV Ratio = 1.1) consistent with at least submassive (intermediate risk) PE. The presence of right heart strain has been associated with an increased risk of morbidity and mortality. Please activate Code PE by paging [phone]. Mild cardiomegaly.  Pulmonary vascular congestion. Severe COPD. Critical Value/emergent results were called by telephone at the time of interpretation on 11/02/2016 at 7:36 pm to Dr. Nanda Quinton , who verbally acknowledged these results. Electronically Signed   By: Elon Alas M.D.   On: 11/02/2016 19:38        Scheduled Meds: . dextromethorphan-guaiFENesin  1 tablet Oral BID  . elvitegravir-cobicistat-emtricitabine-tenofovir  1 tablet Oral Q breakfast  . folic acid  1 mg Oral Daily  . gabapentin  600 mg Oral QHS  . [START ON 11/04/2016] Influenza vac split quadrivalent PF  0.5 mL Intramuscular Tomorrow-1000  . LORazepam  0-4 mg Intravenous Q6H   Followed by  . [START ON 11/04/2016] LORazepam  0-4 mg Intravenous Q12H  . magnesium oxide  400 mg Oral BID  . mirtazapine  15 mg Oral QHS  . multivitamin with minerals  1 tablet Oral Daily  . nicotine  21 mg Transdermal Daily  . sodium chloride flush  3 mL Intravenous Q12H  . thiamine  100 mg Oral Daily   Or  . thiamine  100 mg Intravenous Daily   Continuous Infusions: . sodium chloride 100 mL/hr at 11/02/16 2314  . heparin 1,250 Units/hr (11/03/16 0352)     LOS: 1 day    Time spent: 70  mins    Wells Mabe, MD [facility] Pager [phone]  If 7PM-7AM, please contact night-coverage [email or web address] Password [email or web address] 11/03/2016, 8:51 AM

## 2016-11-03 NOTE — Progress Notes (Signed)
VASCULAR LAB PRELIMINARY  PRELIMINARY  PRELIMINARY  PRELIMINARY  Bilateral lower extremity venous duplex completed.    Preliminary report:  Right - Positive for aan acute DVT of a branch of the Gastrocnemius vein. Left - Positive for an acute DVT of the posterior tibial vein distal to mid. ALSO POSITIVE FOR AN ACUTE MOBILE THROMBUS OF THE PROXIMAL POPLITEAL VEIN  Hilmar Moldovan, RVS 11/03/2016, 11:39 AM

## 2016-11-03 NOTE — Consult Note (Signed)
Name: [name] Herring MRN: [id number] DOB: Jan 23, 1961    ADMISSION DATE:  11/02/2016 CONSULTATION DATE:    REFERRING MD : Dr. Grandville Silos   CHIEF COMPLAINT:  Dyspnea   HISTORY OF PRESENT ILLNESS:  56 year old male with HIV, HTN, Schizophrenia, polysubstance (cocaine) and ETOH abuse. Presents 2/8 to ED with reported one week substernal chest pain and shortness of breath with non-productive cough. Upon arrival was hypotensive, tachycardiac, and hypoxic. CTA revealed bilateral submassive PE with evidence of right heart strain. Blood pressure stabilized after administration of IV fluids.   2/9 VAS [place] was positive for bilateral DVT with a noted mobile thrombus of the proximal popliteal vein. PCCM was asked to consult.   SIGNIFICANT EVENTS  2/8 > Presents to ED with one week chest pain > PE > Heparin   STUDIES:  CXR 2/8 > Cardiomegaly with vascular congestion  CTA PE 2/8 > Extensive bilateral PE, including occlusive emboli, RV/LV 1.1, Pulmonary vascular congestion, Severe COPD VAS [place] LE 2/9 > right acute DVT of Gastrocnemius vein, left positive for DVT of posterior tibial vein, also positive for an acute mobile thrombus of the proximal popliteal vein  ECHO 2/9 >   PAST MEDICAL HISTORY :   has a past medical history of Depression; HIV (human immunodeficiency virus infection) ([place]); Hypertension; Immune deficiency disorder ([place]); Personality disorder; and Schizophrenia ([place]).  has no past surgical history on file. Prior to Admission medications   Medication Sig Start Date End Date Taking? Authorizing Provider  acetaminophen (TYLENOL) 500 MG tablet Take 1,000 mg by mouth every 6 (six) hours as needed for moderate pain.   Yes Historical Provider, MD  benztropine (COGENTIN) 1 MG tablet Take 1 tablet (1 mg total) by mouth at bedtime. 03/30/16  Yes Benjamine Mola, FNP  elvitegravir-cobicistat-emtricitabine-tenofovir (GENVOYA) 150-150-200-10 MG TABS tablet TAKE 1 TABLET BY MOUTH DAILY WITH BREAKFAST.  Christena Flake [phone] to see Dr Johnnye Sima ASAP* 07/17/16  Yes Janece Canterbury, MD  gabapentin (NEURONTIN) 600 MG tablet Take 600 mg by mouth at bedtime.   Yes Historical Provider, MD  ibuprofen (ADVIL,MOTRIN) 200 MG tablet Take 400 mg by mouth every 6 (six) hours as needed.   Yes Historical Provider, MD  mirtazapine (REMERON) 15 MG tablet Take 15 mg by mouth at bedtime.   Yes Historical Provider, MD  potassium chloride SA (K-DUR,KLOR-CON) 20 MEQ tablet Take 1 tablet (20 mEq total) by mouth 3 (three) times daily. [id number]  Yes Delora Fuel, MD   Allergies  Allergen Reactions  . Amoxicillin Shortness Of Breath and Rash    Has patient had a PCN reaction causing immediate rash, facial/tongue/throat swelling, [name] Has patient had a PCN reaction causing severe rash involving mucus membranes or skin necrosis: no Has patient had a PCN reaction that required hospitalization: yes drs office visit Has patient had a PCN reaction occurring within the last 10 years: no If all of the above answers are "NO", then may proceed with Cephalosporin use.   . Latex Shortness Of Breath    FAMILY HISTORY:  family history includes CAD in his mother; Cancer in his maternal grandmother; Kidney disease in his sister; Lupus in his brother; Stroke in his paternal grandmother. SOCIAL HISTORY:  reports that he has been smoking Cigarettes.  He has been smoking about 1.00 pack per day. He has never used smokeless tobacco. He reports that he drinks alcohol. He reports that he uses drugs, including Cocaine.  REVIEW OF SYSTEMS:   All negative;  except for those that are bolded, which indicate positives.  Constitutional: weight loss, weight gain, night sweats, fevers, chills, fatigue, weakness.  HEENT: headaches, sore throat, sneezing, nasal congestion, post nasal drip, difficulty swallowing, tooth/dental problems, visual complaints, visual changes, ear aches. Neuro: difficulty with speech,  weakness, numbness, ataxia. CV:  chest pain, orthopnea, PND, swelling in lower extremities, dizziness, palpitations, syncope.  Resp: cough, hemoptysis, dyspnea, wheezing. GI: heartburn, indigestion, abdominal pain, nausea, vomiting, diarrhea, constipation, change in bowel habits, loss of appetite, hematemesis, melena, hematochezia.  GU: dysuria, change in color of urine, urgency or frequency, flank pain, hematuria. MSK: joint pain or swelling, decreased range of motion. Psych: change in mood or affect, depression, anxiety, suicidal ideations, homicidal ideations. Skin: rash, itching, bruising.  SUBJECTIVE:  Remains on heparin gtt. On 2L [place].   VITAL SIGNS: Temp:  [97.5 F (36.4 C)-98.7 F (37.1 C)] 98.5 F (36.9 C) (02/09 1305) Pulse Rate:  [56-84] 56 (02/09 1305) Resp:  [14-25] 14 (02/09 1305) BP: (96-154)/(61-95) 154/94 (02/09 1305) SpO2:  [87 %-100 %] 100 % (02/09 1305) Weight:  [63 kg (139 lb)-63.4 kg (139 lb 12.4 oz)] 63.4 kg (139 lb 12.4 oz) (02/08 2254)  PHYSICAL EXAMINATION: General: Adult male, no distress, lying in bed  Neuro: Lethargic, follows commands, moves all extremities    HEENT: Normocephalic   Cardiovascular:  Brady, no MRG, NI S1/S2 Lungs: Clear, diminished, unlabored     Abdomen: non-tender, non-distended, active bowel sounds  Musculoskeletal: no deformities  Skin: warm, dry, intact     Recent Labs Lab 11/02/16 1706 11/03/16 0838  NA 142 144  K 3.0* 4.0  CL 104 113*  CO2 21* 24  BUN 12 10  CREATININE 1.11 0.79  GLUCOSE 84 85    Recent Labs Lab 11/02/16 1706 11/03/16 0157  HGB 8.8* 8.3*  HCT 26.5* 25.3*  WBC 5.3 3.0*  PLT 145* 121*   Dg Chest 2 View  Result Date: 11/02/2016 CLINICAL DATA:  Generalized chest pain and shortness of breath. EXAM: CHEST  2 VIEW COMPARISON:  10/20/2016 FINDINGS: The lungs are clear wiithout focal pneumonia, edema, pneumothorax or pleural effusion. Interstitial markings are diffusely coarsened with chronic  features. The cardio pericardial silhouette is enlarged. There is pulmonary vascular congestion without overt pulmonary edema. IMPRESSION: Cardiomegaly with vascular congestion. Chronic interstitial coarsening. Electronically Signed   By: Misty Stanley M.D.   On: 11/02/2016 18:14   Ct Angio Chest Pe W And/or Wo Contrast  Result Date: 11/02/2016 CLINICAL DATA:  Chest pain for 6 months, worsening for a few days. History of hypertension, HIV, pneumonia. EXAM: CT ANGIOGRAPHY CHEST WITH CONTRAST TECHNIQUE: Multidetector CT imaging of the chest was performed using the standard protocol during bolus administration of intravenous contrast. Multiplanar CT image reconstructions and MIPs were obtained to evaluate the vascular anatomy. CONTRAST:  100 cc Isovue 370 COMPARISON:  Chest radiograph November 02, 2016 at 7:59 hours. FINDINGS: CARDIOVASCULAR: Adequate contrast opacification of the pulmonary artery's. Main pulmonary artery is mildly enlarged at 3.3 cm. LEFT upper lobe segmental to subsegmental occlusive pulmonary embolus. LEFT lower lobe nonocclusive segmental the subsegmental central filling defects. Large central embolus RIGHT middle lobe pulmonary artery, occlusive at the segmental level. Larger RIGHT lower lobar pulmonary embolus casting into the segmental and subsegmental pulmonary arteries, occlusive distally. Heart size is mildly enlarged. RIGHT heart strain (RV/ LV 1.1) No pericardial effusions. Thoracic aorta is normal course and caliber, mild calcific atherosclerosis of the aortic arch. Mild coronary artery calcifications. MEDIASTINUM/NODES: No lymphadenopathy by CT size  criteria. LUNGS/PLEURA: Tracheobronchial tree is patent, no pneumothorax. Severe centrilobular emphysema. Dependent atelectasis without pleural effusion or focal consolidation. UPPER ABDOMEN: Included view of the abdomen is unremarkable. MUSCULOSKELETAL: Visualized soft tissues and included osseous structures appear normal. Review of the MIP  images confirms the above findings. IMPRESSION: Extensive bilateral acute pulmonary emboli, including occlusive emboli. CT evidence of right heart strain (RV/LV Ratio = 1.1) consistent with at least submassive (intermediate risk) PE. The presence of right heart strain has been associated with an increased risk of morbidity and mortality. Please activate Code PE by paging [phone]. Mild cardiomegaly.  Pulmonary vascular congestion. Severe COPD. Critical Value/emergent results were called by telephone at the time of interpretation on 11/02/2016 at 7:36 pm to Dr. Nanda Quinton , who verbally acknowledged these results. Electronically Signed   By: Elon Alas M.D.   On: 11/02/2016 19:38    ASSESSMENT / PLAN:  Pulmonary Embolism with RV Strain with noted DVT to Left posterior tibial and right gastrocnemius vein, with mobile thrombus of proximal popliteal vein  Plan -Maintain Saturation >92 -ECHO pending  -Continue Heparin Gtt -Consult IR for Filter placement  -No clinical need for EKOS as patient is not respiratory distressed and is maintaining blood pressure   Hayden Pedro, AG-ACNP [facility]  Pgr: [phone]  PCCM Pgr: [phone]  Attending Note:  56 year old male with a new onset PE with a DVT in left posterior tibial and right gastorcnemius vein with a mobile thrombus in proximal popliteal vein.  On exam, no SOB, sat of 98 on 2L [place], lungs are clear.  I reviewed CT myself, PE noted.  Discussed with Dr. Grandville Silos and PCCM-NP.  PE:  - Heparin  - Coumadin vs NOAC, will need to discuss with patient  - Hypercoagulable panel  DVT that is mobile  - IVC filter per IR  - Anticoagulation as above  Hypoxemia:  - Titrate O2 for sat of 88-92%  - May need an ambulatory desaturation study prior to discharge for home O2  PCCM will sign off, please call back if needed.  Patient seen and examined, agree with above note.  I dictated the care and orders written for  this patient under my direction.  Rush Farmer, MD [phone]

## 2016-11-03 NOTE — Consult Note (Signed)
Chief Complaint: DVT with PE  Referring Physician:Dr. Irine Seal  Supervising Physician: Sandi Mariscal  Patient Status: [facility] - In-pt  HPI: [name] Herring is an 56 y.o. male who was admitted last night secondary to SOB.  He was found to have a PE as well as a DVT in both lower extremities.  One of the DVTs is noted to be "mobile."  He has a history of HIV, tobacco abuse, ETOH abuse, and polysubstance abuse.  We have been asked today to evaluate him for IVC filter as he is not a candidate for a PE lysis.  He is on heparin, but because his clot is considered "mobile" they have requested a filter incase more of his clot breaks off.    Past Medical History:  Past Medical History:  Diagnosis Date  . Depression   . HIV (human immunodeficiency virus infection) ([place])   . Hypertension   . Immune deficiency disorder ([place])   . Personality disorder   . Schizophrenia [facility])     Past Surgical History: History reviewed. No pertinent surgical history.  Family History:  Family History  Problem Relation Age of Onset  . CAD Mother   . Kidney disease Sister   . Lupus Brother   . Cancer Maternal Grandmother   . Stroke Paternal Grandmother     Social History:  reports that he has been smoking Cigarettes.  He has been smoking about 1.00 pack per day. He has never used smokeless tobacco. He reports that he drinks alcohol. He reports that he uses drugs, including Cocaine.  Allergies:  Allergies  Allergen Reactions  . Amoxicillin Shortness Of Breath and Rash    Has patient had a PCN reaction causing immediate rash, facial/tongue/throat swelling, [name] Has patient had a PCN reaction causing severe rash involving mucus membranes or skin necrosis: no Has patient had a PCN reaction that required hospitalization: yes drs office visit Has patient had a PCN reaction occurring within the last 10 years: no If all of the above answers are "NO", then may proceed with  Cephalosporin use.   . Latex Shortness Of Breath    Medications: Medications reviewed in epic  Please HPI for pertinent positives, otherwise complete 10 system ROS negative, he does complain of bilateral leg discomfort.  Mallampati Score: MD Evaluation Airway: WNL Heart: WNL Abdomen: WNL Chest/ Lungs: Other (comments) Chest/ lungs comments: [place] in place for SOB secondary to PE ASA  Classification: 3 Mallampati/Airway Score: Three  Physical Exam: BP (!) 154/94 (BP Location: Right Arm)   Pulse (!) 56   Temp 98.5 F (36.9 C) (Oral)   Resp 14   Ht _0  (1.778 m)   Wt 139 lb 12.4 oz (63.4 kg)   SpO2 100%   BMI 20.06 kg/m  Body mass index is 20.06 kg/m. General: pleasant, but sleepy WD, black male who is laying in bed in NAD HEENT: head is normocephalic, atraumatic.  Sclera are noninjected.  PERRL.  Ears and nose without any masses or lesions.  Mouth is pink and moist Heart: regular, rate, and rhythm.  Normal s1,s2. No obvious murmurs, gallops, or rubs noted.  Palpable radial and pedal pulses bilaterally Lungs: CTAB, no wheezes, rhonchi, or rales noted.  Respiratory effort nonlabored.  [place] in place Abd: soft, NT, ND, +BS, no masses, hernias, or organomegaly MS: all 4 extremities are symmetrical with no cyanosis, clubbing.  He has minimal edema at both ankle.  No edema of his calves or  erythema, but states they are slightly tender to touch Psych: A&Ox3 with an appropriate affect.   Labs: Results for orders placed or performed during the hospital encounter of 11/02/16 (from the past 48 hour(s))  CBC with Differential     Status: Abnormal   Collection Time: 11/02/16  5:06 PM  Result Value Ref Range   WBC 5.3 4.0 - 10.5 K/uL   RBC 2.67 (L) 4.22 - 5.81 MIL/uL   Hemoglobin 8.8 (L) 13.0 - 17.0 g/dL   HCT 26.5 (L) 39.0 - 52.0 %   MCV 99.3 78.0 - 100.0 fL   MCH 33.0 26.0 - 34.0 pg   MCHC 33.2 30.0 - 36.0 g/dL   RDW 16.4 (H) 11.5 - 15.5 %   Platelets 145 (L) 150 - 400 K/uL    Neutrophils Relative % 62 %   Neutro Abs 3.3 1.7 - 7.7 K/uL   Lymphocytes Relative 26 %   Lymphs Abs 1.4 0.7 - 4.0 K/uL   Monocytes Relative 12 %   Monocytes Absolute 0.6 0.1 - 1.0 K/uL   Eosinophils Relative 0 %   Eosinophils Absolute 0.0 0.0 - 0.7 K/uL   Basophils Relative 0 %   Basophils Absolute 0.0 0.0 - 0.1 K/uL  Comprehensive metabolic panel     Status: Abnormal   Collection Time: 11/02/16  5:06 PM  Result Value Ref Range   Sodium 142 135 - 145 mmol/L   Potassium 3.0 (L) 3.5 - 5.1 mmol/L   Chloride 104 101 - 111 mmol/L   CO2 21 (L) 22 - 32 mmol/L   Glucose, Bld 84 65 - 99 mg/dL   BUN 12 6 - 20 mg/dL   Creatinine, Ser 1.11 0.61 - 1.24 mg/dL   Calcium 9.1 8.9 - 10.3 mg/dL   Total Protein 5.3 (L) 6.5 - 8.1 g/dL   Albumin 2.7 (L) 3.5 - 5.0 g/dL   AST 39 15 - 41 U/L   ALT 19 17 - 63 U/L   Alkaline Phosphatase 44 38 - 126 U/L   Total Bilirubin 0.7 0.3 - 1.2 mg/dL   GFR calc non Af Amer >60 >60 mL/min   GFR calc Af Amer >60 >60 mL/min    Comment: (NOTE) The eGFR has been calculated using the CKD EPI equation. This calculation has not been validated in all clinical situations. eGFR's persistently <60 mL/min signify possible Chronic Kidney Disease.    Anion gap 17 (H) 5 - 15  Ethanol     Status: Abnormal   Collection Time: 11/02/16  5:06 PM  Result Value Ref Range   Alcohol, Ethyl (B) 152 (H) <5 mg/dL    Comment:        LOWEST DETECTABLE LIMIT FOR SERUM ALCOHOL IS 5 mg/dL FOR MEDICAL PURPOSES ONLY   Lipase, blood     Status: None   Collection Time: 11/02/16  5:06 PM  Result Value Ref Range   Lipase 16 11 - 51 U/L  D-dimer, quantitative     Status: Abnormal   Collection Time: 11/02/16  5:16 PM  Result Value Ref Range   D-Dimer, Quant 7.98 (H) 0.00 - 0.50 ug/mL-FEU    Comment: (NOTE) At the manufacturer cut-off of 0.50 ug/mL FEU, this assay has been documented to exclude PE with a sensitivity and negative predictive value of 97 to 99%.  At this time, this assay has  not been approved by the FDA to exclude DVT/VTE. Results should be correlated with clinical presentation.   I-stat troponin, ED  Status: None   Collection Time: 11/02/16  5:19 PM  Result Value Ref Range   Troponin i, poc 0.00 0.00 - 0.08 ng/mL   Comment 3            Comment: Due to the release kinetics of cTnI, a negative result within the first hours of the onset of symptoms does not rule out myocardial infarction with certainty. If myocardial infarction is still suspected, repeat the test at appropriate intervals.   I-Stat CG4 Lactic Acid, ED     Status: Abnormal   Collection Time: 11/02/16  5:22 PM  Result Value Ref Range   Lactic Acid, Venous 4.38 (HH) 0.5 - 1.9 mmol/L   Comment NOTIFIED PHYSICIAN   Influenza panel by PCR (type A & B)     Status: None   Collection Time: 11/02/16  5:32 PM  Result Value Ref Range   Influenza A By PCR NEGATIVE NEGATIVE   Influenza B By PCR NEGATIVE NEGATIVE    Comment: (NOTE) The Xpert Xpress Flu assay is intended as an aid in the diagnosis of  influenza and should not be used as a sole basis for treatment.  This  assay is FDA approved for nasopharyngeal swab specimens only. Nasal  washings and aspirates are unacceptable for Xpert Xpress Flu testing.   Troponin I (q 6hr x 3)     Status: None   Collection Time: 11/02/16  8:43 PM  Result Value Ref Range   Troponin I <0.03 <0.03 ng/mL  Urinalysis, Routine w reflex microscopic     Status: Abnormal   Collection Time: 11/02/16  8:49 PM  Result Value Ref Range   Color, Urine AMBER (A) YELLOW    Comment: BIOCHEMICALS MAY BE AFFECTED BY COLOR   APPearance HAZY (A) CLEAR   Specific Gravity, Urine 1.041 (H) 1.005 - 1.030   pH 5.0 5.0 - 8.0   Glucose, UA NEGATIVE NEGATIVE mg/dL   Hgb urine dipstick NEGATIVE NEGATIVE   Bilirubin Urine SMALL (A) NEGATIVE   Ketones, ur 5 (A) NEGATIVE mg/dL   Protein, ur 30 (A) NEGATIVE mg/dL   Nitrite NEGATIVE NEGATIVE   Leukocytes, UA NEGATIVE NEGATIVE   RBC  / HPF 0-5 0 - 5 RBC/hpf   WBC, UA 0-5 0 - 5 WBC/hpf   Bacteria, UA NONE SEEN NONE SEEN   Squamous Epithelial / LPF 0-5 (A) NONE SEEN   Mucous PRESENT    Ca Oxalate Crys, UA PRESENT   Urine rapid drug screen (hosp performed)     Status: None   Collection Time: 11/02/16  8:49 PM  Result Value Ref Range   Opiates NONE DETECTED NONE DETECTED   Cocaine NONE DETECTED NONE DETECTED   Benzodiazepines NONE DETECTED NONE DETECTED   Amphetamines NONE DETECTED NONE DETECTED   Tetrahydrocannabinol NONE DETECTED NONE DETECTED   Barbiturates NONE DETECTED NONE DETECTED    Comment:        DRUG SCREEN FOR MEDICAL PURPOSES ONLY.  IF CONFIRMATION IS NEEDED FOR ANY PURPOSE, NOTIFY LAB WITHIN 5 DAYS.        LOWEST DETECTABLE LIMITS FOR URINE DRUG SCREEN Drug Class       Cutoff (ng/mL) Amphetamine      1000 Barbiturate      200 Benzodiazepine   867 Tricyclics       619 Opiates          300 Cocaine          300 THC  50   I-Stat CG4 Lactic Acid, ED  (not at  [facility])     Status: Abnormal   Collection Time: 11/02/16  8:58 PM  Result Value Ref Range   Lactic Acid, Venous 3.76 (HH) 0.5 - 1.9 mmol/L   Comment NOTIFIED PHYSICIAN   MRSA PCR Screening     Status: None   Collection Time: 11/02/16 10:50 PM  Result Value Ref Range   MRSA by PCR NEGATIVE NEGATIVE    Comment:        The GeneXpert MRSA Assay (FDA approved for NASAL specimens only), is one component of a comprehensive MRSA colonization surveillance program. It is not intended to diagnose MRSA infection nor to guide or monitor treatment for MRSA infections.   Heparin level (unfractionated)     Status: Abnormal   Collection Time: 11/03/16  1:57 AM  Result Value Ref Range   Heparin Unfractionated 0.17 (L) 0.30 - 0.70 IU/mL    Comment:        IF HEPARIN RESULTS ARE BELOW EXPECTED VALUES, AND PATIENT DOSAGE HAS BEEN CONFIRMED, SUGGEST FOLLOW UP TESTING OF ANTITHROMBIN III LEVELS.   CBC     Status: Abnormal   Collection  Time: 11/03/16  1:57 AM  Result Value Ref Range   WBC 3.0 (L) 4.0 - 10.5 K/uL   RBC 2.51 (L) 4.22 - 5.81 MIL/uL   Hemoglobin 8.3 (L) 13.0 - 17.0 g/dL   HCT 25.3 (L) 39.0 - 52.0 %   MCV 100.8 (H) 78.0 - 100.0 fL   MCH 33.1 26.0 - 34.0 pg   MCHC 32.8 30.0 - 36.0 g/dL   RDW 16.8 (H) 11.5 - 15.5 %   Platelets 121 (L) 150 - 400 K/uL  Magnesium     Status: None   Collection Time: 11/03/16  1:57 AM  Result Value Ref Range   Magnesium 1.8 1.7 - 2.4 mg/dL  Troponin I (q 6hr x 3)     Status: None   Collection Time: 11/03/16  1:57 AM  Result Value Ref Range   Troponin I <0.03 <0.03 ng/mL  Lactic acid, plasma     Status: None   Collection Time: 11/03/16  1:57 AM  Result Value Ref Range   Lactic Acid, Venous 1.7 0.5 - 1.9 mmol/L  Brain natriuretic peptide     Status: None   Collection Time: 11/03/16  3:42 AM  Result Value Ref Range   B Natriuretic Peptide 45.4 0.0 - 100.0 pg/mL  Lactic acid, plasma     Status: None   Collection Time: 11/03/16  3:43 AM  Result Value Ref Range   Lactic Acid, Venous 1.0 0.5 - 1.9 mmol/L  Glucose, capillary     Status: None   Collection Time: 11/03/16  7:52 AM  Result Value Ref Range   Glucose-Capillary 81 65 - 99 mg/dL  Vitamin B12     Status: None   Collection Time: 11/03/16  8:05 AM  Result Value Ref Range   Vitamin B-12 487 180 - 914 pg/mL    Comment: (NOTE) This assay is not validated for testing neonatal or myeloproliferative syndrome specimens for Vitamin B12 levels.   Folate     Status: None   Collection Time: 11/03/16  8:05 AM  Result Value Ref Range   Folate 13.5 >5.9 ng/mL  Iron and TIBC     Status: Abnormal   Collection Time: 11/03/16  8:05 AM  Result Value Ref Range   Iron 46 45 - 182 ug/dL   TIBC 242 (  L) 250 - 450 ug/dL   Saturation Ratios 19 17.9 - 39.5 %   UIBC 196 ug/dL  Ferritin     Status: None   Collection Time: 11/03/16  8:05 AM  Result Value Ref Range   Ferritin 46 24 - 336 ng/mL  Troponin I (q 6hr x 3)     Status: None     Collection Time: 11/03/16  8:38 AM  Result Value Ref Range   Troponin I <0.03 <0.03 ng/mL  Basic metabolic panel     Status: Abnormal   Collection Time: 11/03/16  8:38 AM  Result Value Ref Range   Sodium 144 135 - 145 mmol/L   Potassium 4.0 3.5 - 5.1 mmol/L   Chloride 113 (H) 101 - 111 mmol/L   CO2 24 22 - 32 mmol/L   Glucose, Bld 85 65 - 99 mg/dL   BUN 10 6 - 20 mg/dL   Creatinine, Ser 0.79 0.61 - 1.24 mg/dL   Calcium 8.5 (L) 8.9 - 10.3 mg/dL   GFR calc non Af Amer >60 >60 mL/min   GFR calc Af Amer >60 >60 mL/min    Comment: (NOTE) The eGFR has been calculated using the CKD EPI equation. This calculation has not been validated in all clinical situations. eGFR's persistently <60 mL/min signify possible Chronic Kidney Disease.    Anion gap 7 5 - 15  Heparin level (unfractionated)     Status: None   Collection Time: 11/03/16  9:58 AM  Result Value Ref Range   Heparin Unfractionated 0.33 0.30 - 0.70 IU/mL    Comment:        IF HEPARIN RESULTS ARE BELOW EXPECTED VALUES, AND PATIENT DOSAGE HAS BEEN CONFIRMED, SUGGEST FOLLOW UP TESTING OF ANTITHROMBIN III LEVELS.     Imaging: Dg Chest 2 View  Result Date: 11/02/2016 CLINICAL DATA:  Generalized chest pain and shortness of breath. EXAM: CHEST  2 VIEW COMPARISON:  10/20/2016 FINDINGS: The lungs are clear wiithout focal pneumonia, edema, pneumothorax or pleural effusion. Interstitial markings are diffusely coarsened with chronic features. The cardio pericardial silhouette is enlarged. There is pulmonary vascular congestion without overt pulmonary edema. IMPRESSION: Cardiomegaly with vascular congestion. Chronic interstitial coarsening. Electronically Signed   By: Misty Stanley M.D.   On: 11/02/2016 18:14   Ct Angio Chest Pe W And/or Wo Contrast  Result Date: 11/02/2016 CLINICAL DATA:  Chest pain for 6 months, worsening for a few days. History of hypertension, HIV, pneumonia. EXAM: CT ANGIOGRAPHY CHEST WITH CONTRAST TECHNIQUE:  Multidetector CT imaging of the chest was performed using the standard protocol during bolus administration of intravenous contrast. Multiplanar CT image reconstructions and MIPs were obtained to evaluate the vascular anatomy. CONTRAST:  100 cc Isovue 370 COMPARISON:  Chest radiograph November 02, 2016 at 7:59 hours. FINDINGS: CARDIOVASCULAR: Adequate contrast opacification of the pulmonary artery's. Main pulmonary artery is mildly enlarged at 3.3 cm. LEFT upper lobe segmental to subsegmental occlusive pulmonary embolus. LEFT lower lobe nonocclusive segmental the subsegmental central filling defects. Large central embolus RIGHT middle lobe pulmonary artery, occlusive at the segmental level. Larger RIGHT lower lobar pulmonary embolus casting into the segmental and subsegmental pulmonary arteries, occlusive distally. Heart size is mildly enlarged. RIGHT heart strain (RV/ LV 1.1) No pericardial effusions. Thoracic aorta is normal course and caliber, mild calcific atherosclerosis of the aortic arch. Mild coronary artery calcifications. MEDIASTINUM/NODES: No lymphadenopathy by CT size criteria. LUNGS/PLEURA: Tracheobronchial tree is patent, no pneumothorax. Severe centrilobular emphysema. Dependent atelectasis without pleural effusion or focal consolidation. UPPER  ABDOMEN: Included view of the abdomen is unremarkable. MUSCULOSKELETAL: Visualized soft tissues and included osseous structures appear normal. Review of the MIP images confirms the above findings. IMPRESSION: Extensive bilateral acute pulmonary emboli, including occlusive emboli. CT evidence of right heart strain (RV/LV Ratio = 1.1) consistent with at least submassive (intermediate risk) PE. The presence of right heart strain has been associated with an increased risk of morbidity and mortality. Please activate Code PE by paging [phone]. Mild cardiomegaly.  Pulmonary vascular congestion. Severe COPD. Critical Value/emergent results were called by telephone  at the time of interpretation on 11/02/2016 at 7:36 pm to Dr. Nanda Quinton , who verbally acknowledged these results. Electronically Signed   By: Elon Alas M.D.   On: 11/02/2016 19:38    Assessment/Plan 1. bilateral DVTs with submassive PE -we will plan to place a retrievable IVC filter for this patient given his lower extremity clot burden and pulmonary burden as well. -he is NPO -labs and vitals have been reviewed.  PT/INR is pending -Risks and Benefits discussed with the patient including, but not limited to bleeding, infection, contrast induced renal failure, filter fracture or migration which can lead to emergency surgery or even death, strut penetration with damage or irritation to adjacent structures and caval thrombosis. All of the patient's questions were answered, patient is agreeable to proceed. Consent signed and in chart.  Thank you for this interesting consult.  I greatly enjoyed meeting [name] Herring and look forward to participating in their care.  A copy of this report was sent to the requesting provider on this date.  Electronically Signed: Henreitta Cea 11/03/2016, 2:02 PM   I spent a total of 40 Minutes    in face to face in clinical consultation, greater than 50% of which was counseling/coordinating care for DVT with PE

## 2016-11-04 ENCOUNTER — Inpatient Hospital Stay (HOSPITAL_COMMUNITY): Payer: Medicare Other

## 2016-11-04 ENCOUNTER — Encounter (HOSPITAL_COMMUNITY): Payer: Self-pay | Admitting: Interventional Radiology

## 2016-11-04 DIAGNOSIS — I82403 Acute embolism and thrombosis of unspecified deep veins of lower extremity, bilateral: Secondary | ICD-10-CM | POA: Diagnosis present

## 2016-11-04 DIAGNOSIS — I82433 Acute embolism and thrombosis of popliteal vein, bilateral: Secondary | ICD-10-CM

## 2016-11-04 HISTORY — PX: IR GENERIC HISTORICAL: IMG1180011

## 2016-11-04 LAB — CBC
HCT: 27.5 % — ABNORMAL LOW (ref 39.0–52.0)
HEMOGLOBIN: 8.8 g/dL — AB (ref 13.0–17.0)
MCH: 33 pg (ref 26.0–34.0)
MCHC: 32 g/dL (ref 30.0–36.0)
MCV: 103 fL — ABNORMAL HIGH (ref 78.0–100.0)
Platelets: 125 10*3/uL — ABNORMAL LOW (ref 150–400)
RBC: 2.67 MIL/uL — ABNORMAL LOW (ref 4.22–5.81)
RDW: 17.1 % — ABNORMAL HIGH (ref 11.5–15.5)
WBC: 3.5 10*3/uL — ABNORMAL LOW (ref 4.0–10.5)

## 2016-11-04 LAB — COMPREHENSIVE METABOLIC PANEL
ALK PHOS: 45 U/L (ref 38–126)
ALT: 23 U/L (ref 17–63)
ANION GAP: 8 (ref 5–15)
AST: 58 U/L — ABNORMAL HIGH (ref 15–41)
Albumin: 2.3 g/dL — ABNORMAL LOW (ref 3.5–5.0)
BILIRUBIN TOTAL: 0.9 mg/dL (ref 0.3–1.2)
BUN: 9 mg/dL (ref 6–20)
CALCIUM: 8.3 mg/dL — AB (ref 8.9–10.3)
CO2: 23 mmol/L (ref 22–32)
Chloride: 109 mmol/L (ref 101–111)
Creatinine, Ser: 0.76 mg/dL (ref 0.61–1.24)
GFR calc non Af Amer: >60 mL/min (ref >60)
Glucose, Bld: 80 mg/dL (ref 65–99)
Potassium: 4.6 mmol/L (ref 3.5–5.1)
SODIUM: 140 mmol/L (ref 135–145)
TOTAL PROTEIN: 4.6 g/dL — AB (ref 6.5–8.1)

## 2016-11-04 LAB — GLUCOSE, CAPILLARY: GLUCOSE-CAPILLARY: 82 mg/dL (ref 65–99)

## 2016-11-04 LAB — HEPARIN LEVEL (UNFRACTIONATED)
HEPARIN UNFRACTIONATED: 0.45 [IU]/mL (ref 0.30–0.70)
HEPARIN UNFRACTIONATED: 0.46 [IU]/mL (ref 0.30–0.70)
Heparin Unfractionated: 0.43 IU/mL (ref 0.30–0.70)

## 2016-11-04 MED ORDER — LIDOCAINE HCL (PF) 1 % IJ SOLN
INTRAMUSCULAR | Status: AC | PRN
  Administered 2016-11-04: 5 mL

## 2016-11-04 MED ORDER — FENTANYL CITRATE (PF) 100 MCG/2ML IJ SOLN
INTRAMUSCULAR | Status: AC
  Filled 2016-11-04: qty 2

## 2016-11-04 MED ORDER — BUDESONIDE 0.25 MG/2ML IN SUSP
0.2500 mg | Freq: Two times a day (BID) | RESPIRATORY_TRACT | Status: DC
  Administered 2016-11-04 – 2016-11-07 (×6): 0.25 mg via RESPIRATORY_TRACT
  Filled 2016-11-04 (×7): qty 2

## 2016-11-04 MED ORDER — IOPAMIDOL (ISOVUE-300) INJECTION 61%
INTRAVENOUS | Status: AC
  Filled 2016-11-04: qty 50

## 2016-11-04 MED ORDER — TIOTROPIUM BROMIDE MONOHYDRATE 18 MCG IN CAPS
18.0000 ug | ORAL_CAPSULE | Freq: Every day | RESPIRATORY_TRACT | Status: DC
  Administered 2016-11-04 – 2016-11-07 (×4): 18 ug via RESPIRATORY_TRACT
  Filled 2016-11-04: qty 5

## 2016-11-04 MED ORDER — IPRATROPIUM-ALBUTEROL 0.5-2.5 (3) MG/3ML IN SOLN
3.0000 mL | RESPIRATORY_TRACT | Status: DC | PRN
  Filled 2016-11-04: qty 3

## 2016-11-04 MED ORDER — FENTANYL CITRATE (PF) 100 MCG/2ML IJ SOLN
INTRAMUSCULAR | Status: AC | PRN
  Administered 2016-11-04: 25 ug via INTRAVENOUS

## 2016-11-04 MED ORDER — IOPAMIDOL (ISOVUE-300) INJECTION 61%
INTRAVENOUS | Status: AC
  Administered 2016-11-04: 30 mL
  Filled 2016-11-04: qty 100

## 2016-11-04 MED ORDER — MIDAZOLAM HCL 2 MG/2ML IJ SOLN
INTRAMUSCULAR | Status: AC | PRN
  Administered 2016-11-04: 1 mg via INTRAVENOUS

## 2016-11-04 MED ORDER — MIDAZOLAM HCL 2 MG/2ML IJ SOLN
INTRAMUSCULAR | Status: AC
  Filled 2016-11-04: qty 2

## 2016-11-04 MED ORDER — LIDOCAINE HCL (PF) 1 % IJ SOLN
INTRAMUSCULAR | Status: AC
  Filled 2016-11-04: qty 30

## 2016-11-04 NOTE — Plan of Care (Signed)
Problem: Education: Goal: Knowledge of [facility] Education information/materials will improve Outcome: Progressing Patient is alert and oriented; education needs to be reinforced  Problem: Safety: Goal: Ability to remain free from injury will improve Outcome: Progressing Patient oriented to room and equipment; able to verbalize needs. Bed alarm activated.  Problem: Pain Managment: Goal: General experience of comfort will improve Patient has not complained of any pain nor discomfort  Problem: Activity: Goal: Risk for activity intolerance will decrease Outcome: Not Progressing Patient currently on bedrst for PE and DVTs  Problem: Nutrition: Goal: Adequate nutrition will be maintained Outcome: Progressing Patient no longer NPO; on heart healthy diet.

## 2016-11-04 NOTE — Progress Notes (Signed)
ANTICOAGULATION CONSULT NOTE - Follow-up Consult  Pharmacy Consult for heparin Indication: pulmonary embolus and acute DVT  Assessment: 81 yoM presents with CP found to have extensive bilateral PE and acute bilateral DVT. Heparin level 0.45 (therapeutic, confirmed), hemoglobin low/stable, plts wnl. No overt bleeding noted.   Goal of Therapy:  Heparin level 0.3-0.7 units/ml Monitor platelets by anticoagulation protocol: Yes   Plan:  Continue heparin infusion at 1400 units/hr Daily Heparin level and CBC Monitor for sxs of bleeding   Patient Measurements: Height: 5\' 10"  (177.8 cm) Weight: 139 lb 12.4 oz (63.4 kg) IBW/kg (Calculated) : 73 Heparin Dosing Weight: 63 kg  Vital Signs: Temp: 97.8 F (36.6 C) (02/10 1431) Temp Source: Oral (02/10 1431) BP: 157/96 (02/10 1025) Pulse Rate: 85 (02/10 1431)  Labs:  Recent Labs  11/02/16 1706 11/02/16 2043 11/03/16 0157 11/03/16 0838  11/03/16 1347 11/03/16 1747 11/04/16 0207 11/04/16 1339  HGB 8.8*  --  8.3*  --   --   --   --  8.8*  --   HCT 26.5*  --  25.3*  --   --   --   --  27.5*  --   PLT 145*  --  121*  --   --   --   --  125*  --   LABPROT  --   --   --   --   --  15.8*  --   --   --   INR  --   --   --   --   --  1.25  --   --   --   HEPARINUNFRC  --   --  0.17*  --   < >  --  0.29* 0.43 0.45  CREATININE 1.11  --   --  0.79  --   --   --  0.76  --   TROPONINI  --  <0.03 <0.03 <0.03  --   --   --   --   --   < > = values in this interval not displayed.  Estimated Creatinine Clearance: 93.6 mL/min (by C-G formula based on SCr of 0.76 mg/dL).  Belia Heman, PharmD PGY1 Pharmacy Resident [phone] (Pager) 11/04/2016 2:48 PM

## 2016-11-04 NOTE — Progress Notes (Signed)
ANTICOAGULATION CONSULT NOTE - Follow-up Consult  Pharmacy Consult for heparin Indication: pulmonary embolus and acute DVT  Assessment: 78 yoM presents with CP found to have extensive bilateral PE and acute bilateral DVT. Heparin level remains therapeutic at 0.46, hemoglobin low/stable, plts wnl. No overt bleeding noted.   Goal of Therapy:  Heparin level 0.3-0.7 units/ml Monitor platelets by anticoagulation protocol: Yes   Plan:  Continue heparin infusion at 1400 units/hr Daily Heparin level and CBC Monitor for sxs of bleeding   Patient Measurements: Height: 5\' 10"  (177.8 cm) Weight: 139 lb 12.4 oz (63.4 kg) IBW/kg (Calculated) : 73 Heparin Dosing Weight: 63 kg  Vital Signs: Temp: 97.9 F (36.6 C) (02/10 1600) Temp Source: Oral (02/10 1600) BP: 135/90 (02/10 1431) Pulse Rate: 68 (02/10 1600)  Labs:  Recent Labs  11/02/16 1706 11/02/16 2043 11/03/16 0157 11/03/16 0838  11/03/16 1347  11/04/16 0207 11/04/16 1339 11/04/16 1844  HGB 8.8*  --  8.3*  --   --   --   --  8.8*  --   --   HCT 26.5*  --  25.3*  --   --   --   --  27.5*  --   --   PLT 145*  --  121*  --   --   --   --  125*  --   --   LABPROT  --   --   --   --   --  15.8*  --   --   --   --   INR  --   --   --   --   --  1.25  --   --   --   --   HEPARINUNFRC  --   --  0.17*  --   < >  --   < > 0.43 0.45 0.46  CREATININE 1.11  --   --  0.79  --   --   --  0.76  --   --   TROPONINI  --  <0.03 <0.03 <0.03  --   --   --   --   --   --   < > = values in this interval not displayed.  Estimated Creatinine Clearance: 93.6 mL/min (by C-G formula based on SCr of 0.76 mg/dL).  Belia Heman, PharmD PGY1 Pharmacy Resident [phone] (Pager) 11/04/2016 7:53 PM

## 2016-11-04 NOTE — Progress Notes (Addendum)
PROGRESS NOTE    [name] Herring  [id number] DOB: [birth year] DOA: 11/02/2016 PCP: No PCP Per Patient    Brief Narrative:  Patient is a 56 year old gentleman history of hypertension, schizophrenia, depression, HIV, tobacco abuse, polysubstance abuse, alcohol abuse presented to the ED with pleuritic chest pain with associated shortness of breath. CT angiogram chest done was consistent with bilateral submassive PE with evidence of right heart strain. Patient also noted to be hypotensive on admission. Patient placed in the step down unit on IV heparin. 2-D echo which was done with a mildly reduced right ventricular systolic function with a EF of 50-55%. Bilateral lower extremity Dopplers positive for DVT with an acute mobile DVT in the left lower extremity. Critical care and IR consulted. Patient for IVC filter today 11/04/2016.   Assessment & Plan:   Principal Problem:   PE (pulmonary thromboembolism) ([place]) Active Problems:   DVT of lower extremity, bilateral ([facility])   Human immunodeficiency virus (HIV) disease (H[facility])   Cocaine use disorder, severe, dependence ([place])   Alcohol use disorder, severe, dependence ([place])   Paranoid schizophrenia, chronic condition ([place])   Tobacco abuse   Hypotension   Hypokalemia   Elevated lactic acid level  #1 bilateral submassive PE with evidence of right heart strain/with acute mobile DVT in left proximal popliteal vein On admission patient was noted to be hypotensive with improvement with blood pressure with IV fluids. Patient more alert today and following commands. 2-D echo with EF of 50-55%, no wall motion abnormalities, grade 1 diastolic dysfunction, right ventricular cavity size moderately dilated with normal wall thickness with mildly reduced systolic function. Bilateral lower extremity Dopplers positive for acute DVT in the branch of the gastrocnemius vein, acute mobile DVT involving proximal popliteal vein, DVT of the distal to mid posterior tibial vein  of the left lower extremity. Cardiac enzymes negative. BNP at 45.4. Critical care was consulted who recommended placement of IVC filter per interventional radiology which will be done today. Continue IV heparin. Likely transition to oral anticoagulation in the next 48-72 hours. Follow.  #2 HIV Last CD4 count 470 with a viral load is less than 20 on 07/16/2016. Continue home HIV medications.  #3 alcohol abuse Alcohol cessation. Patient somewhat diaphoretic. Patient currently on Ativan withdrawal protocol. Folic acid, thiamine, multivitamin. Follow.  #3 tobacco abuse/severe COPD per CT chest Tobacco cessation. Nicotine patch. Placed on Spiriva and Pulmicort nebs. Duo nebs as needed. Place on Mucinex.  #4 paranoid schizophrenia/depression No suicidal or homicidal ideation. Continue Remeron.  #5 hypotension Secondary to problem #1. Improved with IV fluids. Was tolerating appropriate oral intake will saline lock IV fluids. Follow.  #6 hypokalemia Repleted. Magnesium at 1.8. Placed on magnesium oxide. Follow.  #7 anemia Patient with no overt bleeding. Anemia panel consistent with anemia of chronic disease. Follow H&H. Transfusion threshold hemoglobin less than 7.    DVT prophylaxis: Heparin Code Status: Full Family Communication: No family at bedside. Disposition Plan: Remain the step down unit today. Likely home when medically stable, improvement with shortness of breath and dyspnea and chest pain and went on oral anticoagulants.    Consultants:   Interventional radiology Dr. Pascal Lux 11/03/2016  PCCM Dr. Nelda Marseille 11/03/2016  Procedures:   CT angiogram chest 11/02/2016  Chest x-ray 11/02/2016  2-D echo 11/03/2016  Lower extremity Dopplers 11/03/2016  Antimicrobials:   None   Subjective: Patient more alert this morning. Patient states some improvement with shortness of breath and chest pain. No abdominal pain. Patient states tolerating clear liquids.  Objective: Vitals:    11/04/16 0955 11/04/16 1009 11/04/16 1013 11/04/16 1020  BP: (!) 180/95 (!) 159/90 (!) 148/97 (!) 139/95  Pulse: (!) 59 63 (!) 59 74  Resp: 15 18 (!) 21 (!) 27  Temp:      TempSrc:      SpO2: 97% 98% 99% 99%  Weight:      Height:        Intake/Output Summary (Last 24 hours) at 11/04/16 1024 Last data filed at 11/04/16 [id number]  Gross per 24 hour  Intake          2022.51 ml  Output              650 ml  Net          1372.51 ml   Filed Weights   11/02/16 1622 11/02/16 2254  Weight: 63 kg (139 lb) 63.4 kg (139 lb 12.4 oz)    Examination:  General exam: Alert. Diaphoretic. Respiratory system: Clear to auscultation. Respiratory effort normal. Cardiovascular system: S1 & S2 heard, RRR. No JVD, murmurs, rubs, gallops or clicks. No pedal edema. Gastrointestinal system: Abdomen is nondistended, soft and nontender. No organomegaly or masses felt. Normal bowel sounds heard. Central nervous system: Alert. FF commands.  Extremities: Symmetric 5 x 5 power. Skin: No rashes, lesions or ulcers Psychiatry: Insight and judgment are good. Mood is appropriate.    Data Reviewed: I have personally reviewed following labs and imaging studies  CBC:  Recent Labs Lab 11/02/16 1706 11/03/16 0157 11/04/16 0207  WBC 5.3 3.0* 3.5*  NEUTROABS 3.3  --   --   HGB 8.8* 8.3* 8.8*  HCT 26.5* 25.3* 27.5*  MCV 99.3 100.8* 103.0*  PLT 145* 121* 0000000*   Basic Metabolic Panel:  Recent Labs Lab 11/02/16 1706 11/03/16 0157 11/03/16 0838 11/04/16 0207  NA 142  --  144 140  K 3.0*  --  4.0 4.6  CL 104  --  113* 109  CO2 21*  --  24 23  GLUCOSE 84  --  85 80  BUN 12  --  10 9  CREATININE 1.11  --  0.79 0.76  CALCIUM 9.1  --  8.5* 8.3*  MG  --  1.8  --   --    GFR: Estimated Creatinine Clearance: 93.6 mL/min (by C-G formula based on SCr of 0.76 mg/dL). Liver Function Tests:  Recent Labs Lab 11/02/16 1706 11/04/16 0207  AST 39 58*  ALT 19 23  ALKPHOS 44 45  BILITOT 0.7 0.9  PROT 5.3* 4.6*    ALBUMIN 2.7* 2.3*    Recent Labs Lab 11/02/16 1706  LIPASE 16   No results for input(s): AMMONIA in the last 168 hours. Coagulation Profile:  Recent Labs Lab 11/03/16 1347  INR 1.25   Cardiac Enzymes:  Recent Labs Lab 11/02/16 2043 11/03/16 0157 11/03/16 0838  TROPONINI <0.03 <0.03 <0.03   BNP (last 3 results) No results for input(s): PROBNP in the last 8760 hours. HbA1C: No results for input(s): HGBA1C in the last 72 hours. CBG:  Recent Labs Lab 11/03/16 0752 11/04/16 0820  GLUCAP 81 82   Lipid Profile: No results for input(s): CHOL, HDL, LDLCALC, TRIG, CHOLHDL, LDLDIRECT in the last 72 hours. Thyroid Function Tests: No results for input(s): TSH, T4TOTAL, FREET4, T3FREE, THYROIDAB in the last 72 hours. Anemia Panel:  Recent Labs  11/03/16 0805  VITAMINB12 487  FOLATE 13.5  FERRITIN 46  TIBC 242*  IRON 46   Sepsis Labs:  Recent  Labs Lab 11/02/16 1722 11/02/16 2058 11/03/16 0157 11/03/16 0343  LATICACIDVEN 4.38* 3.76* 1.7 1.0    Recent Results (from the past 240 hour(s))  Blood Culture (routine x 2)     Status: None (Preliminary result)   Collection Time: 11/02/16  6:45 PM  Result Value Ref Range Status   Specimen Description BLOOD LEFT HAND  Final   Special Requests IN PEDIATRIC BOTTLE 2CC  Final   Culture NO GROWTH < 24 HOURS  Final   Report Status PENDING  Incomplete  Blood Culture (routine x 2)     Status: None (Preliminary result)   Collection Time: 11/02/16  6:52 PM  Result Value Ref Range Status   Specimen Description BLOOD LEFT FOREARM  Final   Special Requests IN PEDIATRIC BOTTLE 2CC  Final   Culture NO GROWTH < 24 HOURS  Final   Report Status PENDING  Incomplete  MRSA PCR Screening     Status: None   Collection Time: 11/02/16 10:50 PM  Result Value Ref Range Status   MRSA by PCR NEGATIVE NEGATIVE Final    Comment:        The GeneXpert MRSA Assay (FDA approved for NASAL specimens only), is one component of a comprehensive  MRSA colonization surveillance program. It is not intended to diagnose MRSA infection nor to guide or monitor treatment for MRSA infections.          Radiology Studies: Dg Chest 2 View  Result Date: 11/02/2016 CLINICAL DATA:  Generalized chest pain and shortness of breath. EXAM: CHEST  2 VIEW COMPARISON:  10/20/2016 FINDINGS: The lungs are clear wiithout focal pneumonia, edema, pneumothorax or pleural effusion. Interstitial markings are diffusely coarsened with chronic features. The cardio pericardial silhouette is enlarged. There is pulmonary vascular congestion without overt pulmonary edema. IMPRESSION: Cardiomegaly with vascular congestion. Chronic interstitial coarsening. Electronically Signed   By: Misty Stanley M.D.   On: 11/02/2016 18:14   Ct Angio Chest Pe W And/or Wo Contrast  Result Date: 11/02/2016 CLINICAL DATA:  Chest pain for 6 months, worsening for a few days. History of hypertension, HIV, pneumonia. EXAM: CT ANGIOGRAPHY CHEST WITH CONTRAST TECHNIQUE: Multidetector CT imaging of the chest was performed using the standard protocol during bolus administration of intravenous contrast. Multiplanar CT image reconstructions and MIPs were obtained to evaluate the vascular anatomy. CONTRAST:  100 cc Isovue 370 COMPARISON:  Chest radiograph November 02, 2016 at 7:59 hours. FINDINGS: CARDIOVASCULAR: Adequate contrast opacification of the pulmonary artery's. Main pulmonary artery is mildly enlarged at 3.3 cm. LEFT upper lobe segmental to subsegmental occlusive pulmonary embolus. LEFT lower lobe nonocclusive segmental the subsegmental central filling defects. Large central embolus RIGHT middle lobe pulmonary artery, occlusive at the segmental level. Larger RIGHT lower lobar pulmonary embolus casting into the segmental and subsegmental pulmonary arteries, occlusive distally. Heart size is mildly enlarged. RIGHT heart strain (RV/ LV 1.1) No pericardial effusions. Thoracic aorta is normal course and  caliber, mild calcific atherosclerosis of the aortic arch. Mild coronary artery calcifications. MEDIASTINUM/NODES: No lymphadenopathy by CT size criteria. LUNGS/PLEURA: Tracheobronchial tree is patent, no pneumothorax. Severe centrilobular emphysema. Dependent atelectasis without pleural effusion or focal consolidation. UPPER ABDOMEN: Included view of the abdomen is unremarkable. MUSCULOSKELETAL: Visualized soft tissues and included osseous structures appear normal. Review of the MIP images confirms the above findings. IMPRESSION: Extensive bilateral acute pulmonary emboli, including occlusive emboli. CT evidence of right heart strain (RV/LV Ratio = 1.1) consistent with at least submassive (intermediate risk) PE. The presence of right heart strain  has been associated with an increased risk of morbidity and mortality. Please activate Code PE by paging [phone]. Mild cardiomegaly.  Pulmonary vascular congestion. Severe COPD. Critical Value/emergent results were called by telephone at the time of interpretation on 11/02/2016 at 7:36 pm to Dr. Nanda Quinton , who verbally acknowledged these results. Electronically Signed   By: Elon Alas M.D.   On: 11/02/2016 19:38        Scheduled Meds: . budesonide (PULMICORT) nebulizer solution  0.25 mg Nebulization BID  . dextromethorphan-guaiFENesin  1 tablet Oral BID  . elvitegravir-cobicistat-emtricitabine-tenofovir  1 tablet Oral Q breakfast  . fentaNYL      . folic acid  1 mg Oral Daily  . gabapentin  600 mg Oral QHS  . Influenza vac split quadrivalent PF  0.5 mL Intramuscular Tomorrow-1000  . iopamidol      . iopamidol      . lidocaine (PF)      . LORazepam  0-4 mg Intravenous Q6H   Followed by  . LORazepam  0-4 mg Intravenous Q12H  . magnesium oxide  400 mg Oral BID  . midazolam      . mirtazapine  15 mg Oral QHS  . multivitamin with minerals  1 tablet Oral Daily  . nicotine  21 mg Transdermal Daily  . sodium chloride flush  3 mL Intravenous  Q12H  . thiamine  100 mg Oral Daily  . tiotropium  18 mcg Inhalation Daily   Continuous Infusions: . sodium chloride 75 mL/hr at 11/03/16 2041  . heparin 1,400 Units/hr (11/04/16 0536)     LOS: 2 days    Time spent: 77 mins    Shikara Mcauliffe, MD [facility] Pager [phone]  If 7PM-7AM, please contact night-coverage [email or web address] Password [email or web address] 11/04/2016, 10:24 AM

## 2016-11-04 NOTE — Sedation Documentation (Signed)
Pt tolerating procedure well. No complaints at this time.

## 2016-11-04 NOTE — Sedation Documentation (Signed)
Patient is resting comfortably. 

## 2016-11-04 NOTE — Progress Notes (Signed)
ANTICOAGULATION CONSULT NOTE - Follow Up Consult  Pharmacy Consult for heparin Indication: PE/DVT  Labs:  Recent Labs  11/02/16 1706 11/02/16 2043  11/03/16 0157 11/03/16 [id number] 11/03/16 0958 11/03/16 1347 11/03/16 1747 11/04/16 0207  HGB 8.8*  --   --  8.3*  --   --   --   --  8.8*  HCT 26.5*  --   --  25.3*  --   --   --   --  27.5*  PLT 145*  --   --  121*  --   --   --   --  125*  LABPROT  --   --   --   --   --   --  15.8*  --   --   INR  --   --   --   --   --   --  1.25  --   --   HEPARINUNFRC  --   --   < > 0.17*  --  0.33  --  0.29* 0.43  CREATININE 1.11  --   --   --  0.79  --   --   --   --   TROPONINI  --  <0.03  --  <0.03 <0.03  --   --   --   --   < > = values in this interval not displayed.   Assessment/Plan:  56yo male therapeutic on heparin after rate change. Will continue gtt at current rate and confirm stable with additional level.   Wynona Neat, PharmD, BCPS  11/04/2016,3:02 AM

## 2016-11-04 NOTE — Procedures (Signed)
Interventional Radiology Procedure Note  Procedure:  IVC Filter Placement  Complications: None  Estimated Blood Loss: < 10 mL  Bard [place]ali IVC filter placed in infrarenal IVC via right IJ vein.  IVC normal in caliber and demonstrates no thrombus.  [name] Herring. Kathlene Cote, M.D Pager:  [phone]

## 2016-11-04 NOTE — Sedation Documentation (Signed)
Procedure started. Pt resting comfortably at this time with no complaints.

## 2016-11-05 DIAGNOSIS — I82401 Acute embolism and thrombosis of unspecified deep veins of right lower extremity: Secondary | ICD-10-CM

## 2016-11-05 LAB — CBC
HEMATOCRIT: 30.6 % — AB (ref 39.0–52.0)
HEMOGLOBIN: 10 g/dL — AB (ref 13.0–17.0)
MCH: 32.6 pg (ref 26.0–34.0)
MCHC: 32.7 g/dL (ref 30.0–36.0)
MCV: 99.7 fL (ref 78.0–100.0)
Platelets: 139 10*3/uL — ABNORMAL LOW (ref 150–400)
RBC: 3.07 MIL/uL — ABNORMAL LOW (ref 4.22–5.81)
RDW: 16.7 % — ABNORMAL HIGH (ref 11.5–15.5)
WBC: 4.2 10*3/uL (ref 4.0–10.5)

## 2016-11-05 LAB — COMPREHENSIVE METABOLIC PANEL
ALT: 18 U/L (ref 17–63)
AST: 25 U/L (ref 15–41)
Albumin: 2.4 g/dL — ABNORMAL LOW (ref 3.5–5.0)
Alkaline Phosphatase: 51 U/L (ref 38–126)
Anion gap: 10 (ref 5–15)
CHLORIDE: 103 mmol/L (ref 101–111)
CO2: 24 mmol/L (ref 22–32)
CREATININE: 0.73 mg/dL (ref 0.61–1.24)
Calcium: 8.8 mg/dL — ABNORMAL LOW (ref 8.9–10.3)
GFR calc Af Amer: >60 mL/min (ref >60)
Glucose, Bld: 85 mg/dL (ref 65–99)
POTASSIUM: 3.6 mmol/L (ref 3.5–5.1)
SODIUM: 137 mmol/L (ref 135–145)
Total Bilirubin: 0.7 mg/dL (ref 0.3–1.2)
Total Protein: 5.3 g/dL — ABNORMAL LOW (ref 6.5–8.1)

## 2016-11-05 LAB — MAGNESIUM: MAGNESIUM: 2 mg/dL (ref 1.7–2.4)

## 2016-11-05 LAB — GLUCOSE, CAPILLARY: GLUCOSE-CAPILLARY: 90 mg/dL (ref 65–99)

## 2016-11-05 LAB — HEPARIN LEVEL (UNFRACTIONATED): Heparin Unfractionated: 0.37 IU/mL (ref 0.30–0.70)

## 2016-11-05 MED ORDER — POTASSIUM CHLORIDE CRYS ER 20 MEQ PO TBCR
40.0000 meq | EXTENDED_RELEASE_TABLET | Freq: Once | ORAL | Status: AC
  Administered 2016-11-05: 40 meq via ORAL
  Filled 2016-11-05: qty 2

## 2016-11-05 NOTE — Progress Notes (Signed)
[name] Herring  [id number] DOB: [birth year] DOA: 11/02/2016 PCP: No PCP Per Patient    Brief Narrative:  Patient is a 56 year old gentleman history of hypertension, schizophrenia, depression, HIV, tobacco abuse, polysubstance abuse, alcohol abuse presented to the ED with pleuritic chest pain with associated shortness of breath. CT angiogram chest done was consistent with bilateral submassive PE with evidence of right heart strain. Patient also noted to be hypotensive on admission. Patient placed in the step down unit on IV heparin. 2-D echo which was done with a mildly reduced right ventricular systolic function with a EF of 50-55%. Bilateral lower extremity Dopplers positive for DVT with an acute mobile DVT in the left lower extremity. Critical care and IR consulted. Patient s/p IVC filter 11/04/2016.   Assessment & Plan:   Principal Problem:   PE (pulmonary thromboembolism) ([place]) Active Problems:   DVT of lower extremity, bilateral (HCC)   Human immunodeficiency virus (HIV) disease (HCC)   Cocaine use disorder, severe, dependence ([place])   Alcohol use disorder, severe, dependence ([place])   Paranoid schizophrenia, chronic condition ([place])   Tobacco abuse   Hypotension   Hypokalemia   Elevated lactic acid level  #1 bilateral submassive PE with evidence of right heart strain/with acute mobile DVT in left proximal popliteal vein On admission patient was noted to be hypotensive with improvement with blood pressure with IV fluids. Patient more alert today and following commands. 2-D echo with EF of 50-55%, no wall motion abnormalities, grade 1 diastolic dysfunction, right ventricular cavity size moderately dilated with normal wall thickness with mildly reduced systolic function. Bilateral lower extremity Dopplers positive for acute DVT in the branch of the gastrocnemius vein, acute mobile DVT involving proximal popliteal vein, DVT of the distal to mid posterior tibial vein of the  left lower extremity. Cardiac enzymes negative. BNP at 45.4. Critical care was consulted who recommended placement of IVC filter per interventional radiology which was done on 11/04/2016 successfully. Continue IV heparin. Likely transition to oral anticoagulation in the next 24 hours. Follow.  #2 HIV Last CD4 count 470 with a viral load is < 20 on 07/16/2016. Continue home HIV medications. Outpatient follow-up.  #3 alcohol abuse Alcohol cessation. Patient with tremors. Less diaphoretic. Patient currently on Ativan withdrawal protocol. Folic acid, thiamine, multivitamin. Follow.  #3 tobacco abuse/severe COPD per CT chest Tobacco cessation. Nicotine patch. Continue Spiriva and Pulmicort nebs and mucinex. Duo nebs as needed.  #4 paranoid schizophrenia/depression No suicidal or homicidal ideation. Continue Remeron.  #5 hypotension Secondary to problem #1. Improved with IV fluids. Saline lock IV fluids.   #6 hypokalemia Repleted. Magnesium at 2.0. Continue magnesium oxide. Follow.  #7 anemia Patient with no overt bleeding. Anemia panel consistent with anemia of chronic disease. Follow H&H. Transfusion threshold hemoglobin less than 7.   #8 debility PT/OT.   DVT prophylaxis: Heparin Code Status: Full Family Communication: Updated patient. No family at bedside. Disposition Plan: Transfer to telemetry. Likely home when medically stable, improvement with shortness of breath and dyspnea and chest pain and went on oral anticoagulants.    Consultants:   Interventional radiology Dr. Pascal Lux 11/03/2016  PCCM Dr. Nelda Marseille 11/03/2016  Procedures:   CT angiogram chest 11/02/2016  Chest x-ray 11/02/2016  2-D echo 11/03/2016  Lower extremity Dopplers 11/03/2016   IVC filter placement Dr. Kathlene Cote 11/04/2016  Antimicrobials:   None   Subjective: Patient alert this morning. Patient states improvement with shortness of breath and chest pain. No abdominal pain. Patient tolerating  solid  diet.  Objective: Vitals:   11/05/16 0404 11/05/16 0614 11/05/16 0908 11/05/16 0911  BP:  138/89    Pulse:      Resp: (!) 24     Temp:      TempSrc:      SpO2: 100%  98% 100%  Weight:      Height:        Intake/Output Summary (Last 24 hours) at 11/05/16 0929 Last data filed at 11/05/16 0600  Gross per 24 hour  Intake             2376 ml  Output             1500 ml  Net              876 ml   Filed Weights   11/02/16 1622 11/02/16 2254  Weight: 63 kg (139 lb) 63.4 kg (139 lb 12.4 oz)    Examination:  General exam: Alert. Tremors Respiratory system: Clear to auscultation. Respiratory effort normal. Cardiovascular system: S1 & S2 heard, RRR. No JVD, murmurs, rubs, gallops or clicks. No pedal edema. Gastrointestinal system: Abdomen is nondistended, soft and nontender. No organomegaly or masses felt. Normal bowel sounds heard. Central nervous system: Alert. FF commands.  Extremities: Symmetric 5 x 5 power. Skin: No rashes, lesions or ulcers Psychiatry: Insight and judgment are good. Mood is appropriate.    Data Reviewed: I have personally reviewed following labs and imaging studies  CBC:  Recent Labs Lab 11/02/16 1706 11/03/16 0157 11/04/16 0207 11/05/16 0411  WBC 5.3 3.0* 3.5* 4.2  NEUTROABS 3.3  --   --   --   HGB 8.8* 8.3* 8.8* 10.0*  HCT 26.5* 25.3* 27.5* 30.6*  MCV 99.3 100.8* 103.0* 99.7  PLT 145* 121* 125* [id number]*   Basic Metabolic Panel:  Recent Labs Lab 11/02/16 1706 11/03/16 0157 11/03/16 0838 11/04/16 0207 11/05/16 0411  NA 142  --  144 140 137  K 3.0*  --  4.0 4.6 3.6  CL 104  --  113* 109 103  CO2 21*  --  24 23 24   GLUCOSE 84  --  85 80 85  BUN 12  --  10 9 <5*  CREATININE 1.11  --  0.79 0.76 0.73  CALCIUM 9.1  --  8.5* 8.3* 8.8*  MG  --  1.8  --   --  2.0   GFR: Estimated Creatinine Clearance: 93.6 mL/min (by C-G formula based on SCr of 0.73 mg/dL). Liver Function Tests:  Recent Labs Lab 11/02/16 1706 11/04/16 0207  11/05/16 0411  AST 39 58* 25  ALT 19 23 18   ALKPHOS 44 45 51  BILITOT 0.7 0.9 0.7  PROT 5.3* 4.6* 5.3*  ALBUMIN 2.7* 2.3* 2.4*    Recent Labs Lab 11/02/16 1706  LIPASE 16   No results for input(s): AMMONIA in the last 168 hours. Coagulation Profile:  Recent Labs Lab 11/03/16 1347  INR 1.25   Cardiac Enzymes:  Recent Labs Lab 11/02/16 2043 11/03/16 0157 11/03/16 0838  TROPONINI <0.03 <0.03 <0.03   BNP (last 3 results) No results for input(s): PROBNP in the last 8760 hours. HbA1C: No results for input(s): HGBA1C in the last 72 hours. CBG:  Recent Labs Lab 11/03/16 0752 11/04/16 0820 11/05/16 0852  GLUCAP 81 82 90   Lipid Profile: No results for input(s): CHOL, HDL, LDLCALC, TRIG, CHOLHDL, LDLDIRECT in the last 72 hours. Thyroid Function Tests: No results for input(s): TSH, T4TOTAL, FREET4, T3FREE, THYROIDAB in  the last 72 hours. Anemia Panel:  Recent Labs  11/03/16 0805  VITAMINB12 487  FOLATE 13.5  FERRITIN 46  TIBC 242*  IRON 46   Sepsis Labs:  Recent Labs Lab 11/02/16 1722 11/02/16 2058 11/03/16 0157 11/03/16 0343  LATICACIDVEN 4.38* 3.76* 1.7 1.0    Recent Results (from the past 240 hour(s))  Blood Culture (routine x 2)     Status: None (Preliminary result)   Collection Time: 11/02/16  6:45 PM  Result Value Ref Range Status   Specimen Description BLOOD LEFT HAND  Final   Special Requests IN PEDIATRIC BOTTLE 2CC  Final   Culture NO GROWTH 2 DAYS  Final   Report Status PENDING  Incomplete  Blood Culture (routine x 2)     Status: None (Preliminary result)   Collection Time: 11/02/16  6:52 PM  Result Value Ref Range Status   Specimen Description BLOOD LEFT FOREARM  Final   Special Requests IN PEDIATRIC BOTTLE 2CC  Final   Culture NO GROWTH 2 DAYS  Final   Report Status PENDING  Incomplete  MRSA PCR Screening     Status: None   Collection Time: 11/02/16 10:50 PM  Result Value Ref Range Status   MRSA by PCR NEGATIVE NEGATIVE Final     Comment:        The GeneXpert MRSA Assay (FDA approved for NASAL specimens only), is one component of a comprehensive MRSA colonization surveillance program. It is not intended to diagnose MRSA infection nor to guide or monitor treatment for MRSA infections.          Radiology Studies: Ir Ivc Filter Plmt / S&i /img Guid/mod Sed  Result Date: 11/04/2016 CLINICAL DATA:  Sub massive bilateral pulmonary embolism and bilateral lower extremity DVT. Clinical indication for IVC filter placement. EXAM: 1. ULTRASOUND GUIDANCE FOR VASCULAR ACCESS OF THE RIGHT INTERNAL JUGULAR VEIN. 2. IVC VENOGRAM. 3. PERCUTANEOUS IVC FILTER PLACEMENT. ANESTHESIA/SEDATION: 1.0 mg IV Versed; 25 mcg IV Fentanyl. Total Moderate Sedation Time: 15 minutes. The patient's level of consciousness and physiologic status were continuously monitored during the procedure by Radiology nursing. A time-out was performed prior to initiating the procedure. CONTRAST:  74mL ISOVUE-300 IOPAMIDOL (ISOVUE-300) INJECTION 61% FLUOROSCOPY TIME:  48 seconds.  3.4 mGy. PROCEDURE: The procedure, risks, benefits, and alternatives were explained to the patient. Questions regarding the procedure were encouraged and answered. The patient understands and consents to the procedure. A time-out was performed prior to initiating the procedure. The right neck was prepped with chlorhexidine in a sterile fashion, and a sterile drape was applied covering the operative field. A sterile gown and sterile gloves were used for the procedure. Local anesthesia was provided with 1% Lidocaine. Ultrasound was utilized to confirm patency of the right internal jugular vein. Under direct ultrasound guidance, a 21 gauge needle was advanced into the right internal jugular vein with ultrasound image documentation performed. After securing access with a micropuncture dilator, a guidewire was advanced into the inferior vena cava. A deployment sheath was advanced over the  guidewire. This was utilized to perform IVC venography. The deployment sheath was further positioned in an appropriate location for filter deployment. A Bard Denali IVC filter was then advanced in the sheath. This was then fully deployed in the infrarenal IVC. Final filter position was confirmed with a fluoroscopic spot image. After the procedure the sheath was removed and hemostasis obtained with manual compression. COMPLICATIONS: None. FINDINGS: IVC venography demonstrates a normal caliber IVC with no evidence of thrombus. Renal veins  are identified bilaterally. The IVC filter was successfully positioned below the level of the renal veins and is appropriately oriented. This IVC filter has both permanent and retrievable indications. IMPRESSION: Placement of percutaneous IVC filter in infrarenal IVC. IVC venogram shows no evidence of IVC thrombus and normal caliber of the inferior vena cava. This filter does have both permanent and retrievable indications. PLAN: This IVC filter is potentially retrievable. The patient will be assessed for filter retrieval by Interventional Radiology in approximately 8-12 weeks. Further recommendations regarding filter retrieval, continued surveillance or declaration of device permanence, will be made at that time. Electronically Signed   By: Aletta Edouard M.D.   On: 11/04/2016 10:58        Scheduled Meds: . budesonide (PULMICORT) nebulizer solution  0.25 mg Nebulization BID  . dextromethorphan-guaiFENesin  1 tablet Oral BID  . elvitegravir-cobicistat-emtricitabine-tenofovir  1 tablet Oral Q breakfast  . folic acid  1 mg Oral Daily  . gabapentin  600 mg Oral QHS  . LORazepam  0-4 mg Intravenous Q12H  . magnesium oxide  400 mg Oral BID  . mirtazapine  15 mg Oral QHS  . multivitamin with minerals  1 tablet Oral Daily  . nicotine  21 mg Transdermal Daily  . sodium chloride flush  3 mL Intravenous Q12H  . thiamine  100 mg Oral Daily  . tiotropium  18 mcg Inhalation  Daily   Continuous Infusions: . heparin 1,400 Units/hr (11/05/16 0110)     LOS: 3 days    Time spent: 34 mins    THOMPSON,DANIEL, MD [facility] Pager [phone]  If 7PM-7AM, please contact night-coverage [email or web address] Password [facility] 11/05/2016, 9:29 AM

## 2016-11-05 NOTE — Progress Notes (Signed)
ANTICOAGULATION CONSULT NOTE - Follow-up Consult  Pharmacy Consult for Heparin Indication: pulmonary embolus and acute DVT  Assessment: 57 yoM presents with CP found to have extensive bilateral PE and acute bilateral DVT. Heparin level remains therapeutic at 0.37, hemoglobin low/stable, plts wnl. No overt bleeding noted.   Goal of Therapy:  Heparin level 0.3-0.7 units/ml Monitor platelets by anticoagulation protocol: Yes   Plan:  Continue heparin infusion at 1400 units/hr Daily Heparin level and CBC Monitor for sxs of bleeding   Patient Measurements: Height: 5\' 10"  (177.8 cm) Weight: 139 lb 12.4 oz (63.4 kg) IBW/kg (Calculated) : 73 Heparin Dosing Weight: 63 kg  Vital Signs: Temp: 97.7 F (36.5 C) (02/11 0800) Temp Source: Oral (02/11 0800) BP: 138/89 (02/11 0614) Pulse Rate: 57 (02/11 0800)  Labs:  Recent Labs  11/02/16 2043 11/03/16 0157 11/03/16 0838  11/03/16 1347  11/04/16 0207 11/04/16 1339 11/04/16 1844 11/05/16 0411  HGB  --  8.3*  --   --   --   --  8.8*  --   --  10.0*  HCT  --  25.3*  --   --   --   --  27.5*  --   --  30.6*  PLT  --  121*  --   --   --   --  125*  --   --  139*  LABPROT  --   --   --   --  15.8*  --   --   --   --   --   INR  --   --   --   --  1.25  --   --   --   --   --   HEPARINUNFRC  --  0.17*  --   < >  --   < > 0.43 0.45 0.46 0.37  CREATININE  --   --  0.79  --   --   --  0.76  --   --  0.73  TROPONINI <0.03 <0.03 <0.03  --   --   --   --   --   --   --   < > = values in this interval not displayed.  Estimated Creatinine Clearance: 93.6 mL/min (by C-G formula based on SCr of 0.73 mg/dL).  Belia Heman, PharmD PGY1 Pharmacy Resident [phone] (Pager) 11/05/2016 11:25 AM

## 2016-11-05 NOTE — Evaluation (Signed)
Physical Therapy Evaluation Patient Details Name: [name] Herring MRN: [id number] DOB: [birth year] Today's Date: 11/05/2016   History of Present Illness  56 y.o. male with medical history significant of hypertension, schizophrenia, personality disorder, depression, HIV (CD4 470 and VL<20 on 07/16/16), tobacco abuse, cocaine abuse, alcohol abuse, who presented with chest pain.  He was admitted with bilat PEs and BLE DVTs. Pt underwent IVC filter placement 11-04-16.  Clinical Impression  Pt admitted with above diagnosis. Pt currently with functional limitations due to the deficits listed below (see PT Problem List). On eval, pt required min assist bed mobility, mod assist transfers, and mod assist ambulation with RW 15 feet. Pt demo poor safety awareness. He reports having no assistance at home. Pt will benefit from skilled PT to increase their independence and safety with mobility to allow discharge to the venue listed below.       Follow Up Recommendations SNF;Supervision/Assistance - 24 hour    Equipment Recommendations  Rolling walker with 5" wheels    Recommendations for Other Services       Precautions / Restrictions Precautions Precautions: Fall      Mobility  Bed Mobility Overal bed mobility: Needs Assistance Bed Mobility: Sit to Supine       Sit to supine: Min assist   General bed mobility comments: verbal cues for sequencing  Transfers Overall transfer level: Needs assistance Equipment used: Rolling walker (2 wheeled) Transfers: Sit to/from [facility] Sit to Stand: Mod assist Stand pivot transfers: Mod assist       General transfer comment: verbal cues for hand placement and sequencing. Retropulsive with initial stance. Supporting BLE on recliner. Poor safety awareness.  Ambulation/Gait Ambulation/Gait assistance: Mod assist Ambulation Distance (Feet): 15 Feet Assistive device: Rolling walker (2 wheeled) Gait Pattern/deviations:  Shuffle;Decreased stride length;Step-through pattern;Trunk flexed   Gait velocity interpretation: Below normal speed for age/gender General Gait Details: continuous verbal cues for RW management and safety. Pt tends to push RW way out in front and unable to keep up with short, shuffle steps. Poor safety awareness. Followed closely with chair.  Stairs            Wheelchair Mobility    Modified Rankin (Stroke Patients Only)       Balance Overall balance assessment: Needs assistance Sitting-balance support: No upper extremity supported;Feet supported Sitting balance-Leahy Scale: Good     Standing balance support: Bilateral upper extremity supported;During functional activity Standing balance-Leahy Scale: Poor                               Pertinent Vitals/Pain Pain Assessment: No/denies pain    Home Living Family/patient expects to be discharged to:: Private residence Living Arrangements: Alone   Type of Home: Apartment Home Access: Level entry     Home Layout: One level Home Equipment: None      Prior Function Level of Independence: Independent               Hand Dominance        Extremity/Trunk Assessment   Upper Extremity Assessment Upper Extremity Assessment: Generalized weakness    Lower Extremity Assessment Lower Extremity Assessment: Generalized weakness    Cervical / Trunk Assessment Cervical / Trunk Assessment: Kyphotic  Communication   Communication: No difficulties  Cognition Arousal/Alertness: Awake/alert Behavior During Therapy: WFL for tasks assessed/performed Overall Cognitive Status: No family/caregiver present to determine baseline cognitive functioning  General Comments: Pt appears to have mild confusion. Unsure of his baseline.     General Comments      Exercises     Assessment/Plan    PT Assessment Patient needs continued PT services  PT Problem List Decreased strength;Decreased  activity tolerance;Decreased balance;Decreased mobility;Decreased safety awareness;Decreased knowledge of precautions;Decreased knowledge of use of DME          PT Treatment Interventions DME instruction;Gait training;Functional mobility training;Balance training;Therapeutic exercise;Therapeutic activities;Patient/family education    PT Goals (Current goals can be found in the Care Plan section)  Acute Rehab PT Goals Patient Stated Goal: home PT Goal Formulation: With patient Time For Goal Achievement: 11/19/16 Potential to Achieve Goals: Fair    Frequency Min 3X/week   Barriers to discharge Decreased caregiver support      Co-evaluation               End of Session Equipment Utilized During Treatment: Gait belt Activity Tolerance: Patient tolerated treatment well Patient left: in bed;with bed alarm set;with call bell/phone within reach Nurse Communication: Mobility status         Time: [id number] PT Time Calculation (min) (ACUTE ONLY): 21 min   Charges:   PT Evaluation $PT Eval Moderate Complexity: 1 Procedure     PT G Codes:        Lorriane Shire 11/05/2016, 1:27 PM

## 2016-11-05 NOTE — Progress Notes (Signed)
Pt has arrived to 2w from 4e. Telemetry box applied and CCMD notified. Pt oriented to room. Vitals obtained.   Grant Fontana BSN, RN

## 2016-11-05 NOTE — Progress Notes (Signed)
Pt. Complains of 10/10 leg and chest pain. Patient have flat affect and seem comfortable otherwise. Dayshift RN gave pt. Morphine around shift change. Will give percocet and continue to monitor closely.

## 2016-11-06 LAB — BASIC METABOLIC PANEL
ANION GAP: 7 (ref 5–15)
CALCIUM: 9.7 mg/dL (ref 8.9–10.3)
CO2: 26 mmol/L (ref 22–32)
Chloride: 104 mmol/L (ref 101–111)
Creatinine, Ser: 0.9 mg/dL (ref 0.61–1.24)
GFR calc Af Amer: >60 mL/min (ref >60)
GLUCOSE: 105 mg/dL — AB (ref 65–99)
POTASSIUM: 3.4 mmol/L — AB (ref 3.5–5.1)
SODIUM: 137 mmol/L (ref 135–145)

## 2016-11-06 MED ORDER — VITAMIN B-1 100 MG PO TABS
100.0000 mg | ORAL_TABLET | Freq: Every day | ORAL | Status: DC
  Administered 2016-11-07: 100 mg via ORAL
  Filled 2016-11-06: qty 1

## 2016-11-06 MED ORDER — BENZTROPINE MESYLATE 1 MG PO TABS
1.0000 mg | ORAL_TABLET | Freq: Every day | ORAL | Status: DC
  Administered 2016-11-06: 1 mg via ORAL
  Filled 2016-11-06: qty 1

## 2016-11-06 MED ORDER — APIXABAN 5 MG PO TABS
10.0000 mg | ORAL_TABLET | ORAL | Status: AC
  Administered 2016-11-06: 10 mg via ORAL
  Filled 2016-11-06: qty 2

## 2016-11-06 MED ORDER — APIXABAN 5 MG PO TABS: 5.0000 mg | ORAL_TABLET | Freq: Two times a day (BID) | ORAL | Status: DC

## 2016-11-06 MED ORDER — FOLIC ACID 1 MG PO TABS: 1.0000 mg | ORAL_TABLET | Freq: Every day | ORAL | Status: DC

## 2016-11-06 MED ORDER — ADULT MULTIVITAMIN W/MINERALS CH: 1.0000 | ORAL_TABLET | Freq: Every day | ORAL | Status: DC

## 2016-11-06 MED ORDER — THIAMINE HCL 100 MG/ML IJ SOLN: 100.0000 mg | Freq: Every day | INTRAMUSCULAR | Status: DC

## 2016-11-06 MED ORDER — HEPARIN (PORCINE) IN NACL 100-0.45 UNIT/ML-% IJ SOLN
1400.0000 [IU]/h | INTRAMUSCULAR | Status: AC
  Administered 2016-11-06: 1400 [IU]/h via INTRAVENOUS

## 2016-11-06 MED ORDER — APIXABAN 5 MG PO TABS
5.0000 mg | ORAL_TABLET | Freq: Two times a day (BID) | ORAL | Status: DC
  Administered 2016-11-07: 5 mg via ORAL
  Filled 2016-11-06: qty 1

## 2016-11-06 MED ORDER — LORAZEPAM 2 MG/ML IJ SOLN: 1.0000 mg | Freq: Four times a day (QID) | INTRAMUSCULAR | Status: DC | PRN

## 2016-11-06 MED ORDER — APIXABAN 5 MG PO TABS: 10.0000 mg | ORAL_TABLET | Freq: Two times a day (BID) | ORAL | Status: DC

## 2016-11-06 MED ORDER — APIXABAN 2.5 MG PO TABS: 2.5000 mg | ORAL_TABLET | Freq: Two times a day (BID) | ORAL | Status: DC

## 2016-11-06 MED ORDER — LORAZEPAM 1 MG PO TABS: 1.0000 mg | ORAL_TABLET | Freq: Four times a day (QID) | ORAL | Status: DC | PRN

## 2016-11-06 MED ORDER — LORAZEPAM 2 MG/ML IJ SOLN
0.0000 mg | Freq: Four times a day (QID) | INTRAMUSCULAR | Status: DC
  Administered 2016-11-06: 1 mg via INTRAVENOUS
  Filled 2016-11-06: qty 1

## 2016-11-06 MED ORDER — VITAMIN B-1 100 MG PO TABS
100.0000 mg | ORAL_TABLET | Freq: Every day | ORAL | Status: DC
  Filled 2016-11-06: qty 1

## 2016-11-06 MED ORDER — AMLODIPINE BESYLATE 5 MG PO TABS
5.0000 mg | ORAL_TABLET | Freq: Every day | ORAL | Status: DC
  Administered 2016-11-06: 5 mg via ORAL
  Filled 2016-11-06: qty 1

## 2016-11-06 MED ORDER — LORAZEPAM 2 MG/ML IJ SOLN: 0.0000 mg | Freq: Two times a day (BID) | INTRAMUSCULAR | Status: DC

## 2016-11-06 MED ORDER — HYDRALAZINE HCL 20 MG/ML IJ SOLN
5.0000 mg | Freq: Four times a day (QID) | INTRAMUSCULAR | Status: DC | PRN
Start: 2016-11-06 — End: 2016-11-07

## 2016-11-06 NOTE — Progress Notes (Signed)
PROGRESS NOTE    [name] Herring  [id number] DOB: Feb 22, 1961 DOA: 11/02/2016 PCP: No PCP Per Patient    Brief Narrative:  Patient is a 56 year old gentleman history of hypertension, schizophrenia, depression, HIV, tobacco abuse, polysubstance abuse, alcohol abuse presented to the ED with pleuritic chest pain with associated shortness of breath. CT angiogram chest done was consistent with bilateral submassive PE with evidence of right heart strain. Patient also noted to be hypotensive on admission. Patient placed in the step down unit on IV heparin. 2-D echo which was done with a mildly reduced right ventricular systolic function with a EF of 50-55%. Bilateral lower extremity Dopplers positive for DVT with an acute mobile DVT in the left lower extremity. Critical care and IR consulted. Patient s/p IVC filter 11/04/2016.   Assessment & Plan:   Principal Problem:   PE (pulmonary thromboembolism) ([place]) Active Problems:   DVT of lower extremity, bilateral (HCC)   Human immunodeficiency virus (HIV) disease (HCC)   Cocaine use disorder, severe, dependence ([place])   Alcohol use disorder, severe, dependence ([place])   Paranoid schizophrenia, chronic condition ([place])   Tobacco abuse   Hypotension   Hypokalemia   Elevated lactic acid level   Acute deep vein thrombosis (DVT) of right lower extremity ([place])  #1 bilateral submassive PE with evidence of right heart strain/with acute mobile DVT in left proximal popliteal vein On admission patient was noted to be hypotensive with improvement with blood pressure with IV fluids. Patient more alert today and following commands. 2-D echo with EF of 50-55%, no wall motion abnormalities, grade 1 diastolic dysfunction, right ventricular cavity size moderately dilated with normal wall thickness with mildly reduced systolic function. Bilateral lower extremity Dopplers positive for acute DVT in the branch of the gastrocnemius vein, acute mobile DVT involving proximal  popliteal vein, DVT of the distal to mid posterior tibial vein of the left lower extremity. Cardiac enzymes negative. BNP at 45.4. Critical care was consulted who recommended placement of IVC filter per interventional radiology which was done on 11/04/2016 successfully. Will transition from IV heparin to oral anticoagulation with apixaban today. Follow.  #2 HIV Last CD4 count 470 with a viral load is < 20 on 07/16/2016. Continue home HIV medications. Outpatient follow-up.  #3 alcohol abuse Alcohol cessation. Patient with tremors. Less diaphoretic. Patient currently on Ativan withdrawal protocol. Folic acid, thiamine, multivitamin. Follow.  #3 tobacco abuse/severe COPD per CT chest Tobacco cessation. Nicotine patch. Continue Spiriva and Pulmicort nebs and mucinex. Duo nebs as needed.  #4 paranoid schizophrenia/depression No suicidal or homicidal ideation. Continue Remeron.  #5 hypotension Secondary to problem #1. Improved with IV fluids. Blood pressure elevated this morning and a such patient was placed on Norvasc 5 mg daily however blood pressure systolic now in the high 0000000 and a such will discontinue Norvasc. Saline lock IV fluids.   #6 hypokalemia Replete. Magnesium at 2.0. Continue magnesium oxide. Follow.  #7 anemia Patient with no overt bleeding. Anemia panel consistent with anemia of chronic disease. Follow H&H. Transfusion threshold hemoglobin less than 7.   #8 debility PT/OT. Patient needs skilled nursing facility however patient currently hesitant. Patient will think about skilled nursing facility. Social work alert for SNF placement.   DVT prophylaxis: Heparin>>>apixaban Code Status: Full Family Communication: Updated patient. No family at bedside. Disposition Plan: Transfer to telemetry. Likely SNF vs home with home health when medically stable, improvement with shortness of breath and dyspnea and chest pain and when on oral anticoagulants. Likely 24-48  hours.  Consultants:   Interventional radiology Dr. Pascal Lux 11/03/2016  PCCM Dr. Nelda Marseille 11/03/2016  Procedures:   CT angiogram chest 11/02/2016  Chest x-ray 11/02/2016  2-D echo 11/03/2016  Lower extremity Dopplers 11/03/2016   IVC filter placement Dr. Kathlene Cote 11/04/2016  Antimicrobials:   None   Subjective: Patient alert this morning. Patient states shortness of breath and chest pain improved. No abdominal pain. Tolerating solid diet. Patient hesitant to go to skilled nursing facility.   Objective: Vitals:   11/05/16 1651 11/05/16 2040 11/06/16 0632 11/06/16 1045  BP: (!) 140/101 (!) 156/90 (!) 169/94   Pulse: 67 (!) 55 (!) 58   Resp:  20 18   Temp: 97.6 F (36.4 C) 98.1 F (36.7 C) 97.5 F (36.4 C)   TempSrc: Oral Oral Oral   SpO2: 100% 98% 100% 95%  Weight:      Height:        Intake/Output Summary (Last 24 hours) at 11/06/16 1214 Last data filed at 11/06/16 0914  Gross per 24 hour  Intake              240 ml  Output             1750 ml  Net            -1510 ml   Filed Weights   11/02/16 1622 11/02/16 2254  Weight: 63 kg (139 lb) 63.4 kg (139 lb 12.4 oz)    Examination:  General exam: Alert. Tremors Respiratory system: Clear to auscultation. Respiratory effort normal. Cardiovascular system: S1 & S2 heard, RRR. No JVD, murmurs, rubs, gallops or clicks. No pedal edema. Gastrointestinal system: Abdomen is nondistended, soft and nontender. No organomegaly or masses felt. Normal bowel sounds heard. Central nervous system: Alert. FF commands.  Extremities: Symmetric 5 x 5 power. Skin: No rashes, lesions or ulcers Psychiatry: Insight and judgment are good. Mood is appropriate.    Data Reviewed: I have personally reviewed following labs and imaging studies  CBC:  Recent Labs Lab 11/02/16 1706 11/03/16 0157 11/04/16 0207 11/05/16 0411  WBC 5.3 3.0* 3.5* 4.2  NEUTROABS 3.3  --   --   --   HGB 8.8* 8.3* 8.8* 10.0*  HCT 26.5* 25.3* 27.5* 30.6*   MCV 99.3 100.8* 103.0* 99.7  PLT 145* 121* 125* [id number]*   Basic Metabolic Panel:  Recent Labs Lab 11/02/16 1706 11/03/16 0157 11/03/16 0838 11/04/16 0207 11/05/16 0411  NA 142  --  144 140 137  K 3.0*  --  4.0 4.6 3.6  CL 104  --  113* 109 103  CO2 21*  --  24 23 24   GLUCOSE 84  --  85 80 85  BUN 12  --  10 9 <5*  CREATININE 1.11  --  0.79 0.76 0.73  CALCIUM 9.1  --  8.5* 8.3* 8.8*  MG  --  1.8  --   --  2.0   GFR: Estimated Creatinine Clearance: 93.6 mL/min (by C-G formula based on SCr of 0.73 mg/dL). Liver Function Tests:  Recent Labs Lab 11/02/16 1706 11/04/16 0207 11/05/16 0411  AST 39 58* 25  ALT 19 23 18   ALKPHOS 44 45 51  BILITOT 0.7 0.9 0.7  PROT 5.3* 4.6* 5.3*  ALBUMIN 2.7* 2.3* 2.4*    Recent Labs Lab 11/02/16 1706  LIPASE 16   No results for input(s): AMMONIA in the last 168 hours. Coagulation Profile:  Recent Labs Lab 11/03/16 1347  INR 1.25   Cardiac Enzymes:  Recent Labs  Lab 11/02/16 2043 11/03/16 0157 11/03/16 0838  TROPONINI <0.03 <0.03 <0.03   BNP (last 3 results) No results for input(s): PROBNP in the last 8760 hours. HbA1C: No results for input(s): HGBA1C in the last 72 hours. CBG:  Recent Labs Lab 11/03/16 0752 11/04/16 0820 11/05/16 0852  GLUCAP 81 82 90   Lipid Profile: No results for input(s): CHOL, HDL, LDLCALC, TRIG, CHOLHDL, LDLDIRECT in the last 72 hours. Thyroid Function Tests: No results for input(s): TSH, T4TOTAL, FREET4, T3FREE, THYROIDAB in the last 72 hours. Anemia Panel: No results for input(s): VITAMINB12, FOLATE, FERRITIN, TIBC, IRON, RETICCTPCT in the last 72 hours. Sepsis Labs:  Recent Labs Lab 11/02/16 1722 11/02/16 2058 11/03/16 0157 11/03/16 0343  LATICACIDVEN 4.38* 3.76* 1.7 1.0    Recent Results (from the past 240 hour(s))  Blood Culture (routine x 2)     Status: None (Preliminary result)   Collection Time: 11/02/16  6:45 PM  Result Value Ref Range Status   Specimen Description  BLOOD LEFT HAND  Final   Special Requests IN PEDIATRIC BOTTLE 2CC  Final   Culture NO GROWTH 4 DAYS  Final   Report Status PENDING  Incomplete  Blood Culture (routine x 2)     Status: None (Preliminary result)   Collection Time: 11/02/16  6:52 PM  Result Value Ref Range Status   Specimen Description BLOOD LEFT FOREARM  Final   Special Requests IN PEDIATRIC BOTTLE 2CC  Final   Culture NO GROWTH 4 DAYS  Final   Report Status PENDING  Incomplete  MRSA PCR Screening     Status: None   Collection Time: 11/02/16 10:50 PM  Result Value Ref Range Status   MRSA by PCR NEGATIVE NEGATIVE Final    Comment:        The GeneXpert MRSA Assay (FDA approved for NASAL specimens only), is one component of a comprehensive MRSA colonization surveillance program. It is not intended to diagnose MRSA infection nor to guide or monitor treatment for MRSA infections.          Radiology Studies: No results found.      Scheduled Meds: . budesonide (PULMICORT) nebulizer solution  0.25 mg Nebulization BID  . dextromethorphan-guaiFENesin  1 tablet Oral BID  . elvitegravir-cobicistat-emtricitabine-tenofovir  1 tablet Oral Q breakfast  . folic acid  1 mg Oral Daily  . gabapentin  600 mg Oral QHS  . LORazepam  0-4 mg Intravenous Q6H   Followed by  . [START ON 11/08/2016] LORazepam  0-4 mg Intravenous Q12H  . magnesium oxide  400 mg Oral BID  . mirtazapine  15 mg Oral QHS  . multivitamin with minerals  1 tablet Oral Daily  . nicotine  21 mg Transdermal Daily  . sodium chloride flush  3 mL Intravenous Q12H  . [START ON 11/07/2016] thiamine  100 mg Oral Daily  . tiotropium  18 mcg Inhalation Daily   Continuous Infusions: . heparin 1,400 Units/hr (11/06/16 1037)     LOS: 4 days    Time spent: 69 mins    Frederika Hukill, MD [facility] Pager [phone]  If 7PM-7AM, please contact night-coverage [email or web address] Password [facility] 11/06/2016, 12:14 PM

## 2016-11-06 NOTE — Discharge Instructions (Signed)
Information on my medicine - ELIQUIS (apixaban)  This medication education was reviewed with me or my healthcare representative as part of my discharge preparation.  The pharmacist that spoke with me during my hospital stay was:  Saundra Shelling, [facility]  Why was Eliquis prescribed for you? Eliquis was prescribed to treat blood clots that may have been found in the veins of your legs (deep vein thrombosis) or in your lungs (pulmonary embolism) and to reduce the risk of them occurring again.  What do You need to know about Eliquis ? The starting dose is 5 mg (one tablet) taken TWICE daily for the FIRST SEVEN (7) DAYS, then on 11/13/16  the dose is reduced to 2.5 mg tablet taken TWICE daily.  Eliquis may be taken with or without food.   Try to take the dose about the same time in the morning and in the evening. If you have difficulty swallowing the tablet whole please discuss with your pharmacist how to take the medication safely.  Take Eliquis exactly as prescribed and DO NOT stop taking Eliquis without talking to the doctor who prescribed the medication.  Stopping may increase your risk of developing a new blood clot.  Refill your prescription before you run out.  After discharge, you should have regular check-up appointments with your healthcare provider that is prescribing your Eliquis.    What do you do if you miss a dose? If a dose of ELIQUIS is not taken at the scheduled time, take it as soon as possible on the same day and twice-daily administration should be resumed. The dose should not be doubled to make up for a missed dose.  Important Safety Information A possible side effect of Eliquis is bleeding. You should call your healthcare provider right away if you experience any of the following: ? Bleeding from an injury or your nose that does not stop. ? Unusual colored urine (red or dark brown) or unusual colored stools (red or black). ? Unusual bruising for unknown reasons. ? A  serious fall or if you hit your head (even if there is no bleeding).  Some medicines may interact with Eliquis and might increase your risk of bleeding or clotting while on Eliquis. To help avoid this, consult your healthcare provider or pharmacist prior to using any new prescription or non-prescription medications, including herbals, vitamins, non-steroidal anti-inflammatory drugs (NSAIDs) and supplements.  This website has more information on Eliquis (apixaban): http://[email or web address].com/eliquis/home

## 2016-11-06 NOTE — Progress Notes (Addendum)
ANTICOAGULATION CONSULT NOTE - Follow Up Consult  Pharmacy Consult:  Heparin >> Eliquis Indication:  New PE / DVT  Allergies  Allergen Reactions  . Amoxicillin Shortness Of Breath and Rash    Has patient had a PCN reaction causing immediate rash, facial/tongue/throat swelling, [name] Has patient had a PCN reaction causing severe rash involving mucus membranes or skin necrosis: no Has patient had a PCN reaction that required hospitalization: yes drs office visit Has patient had a PCN reaction occurring within the last 10 years: no If all of the above answers are "NO", then may proceed with Cephalosporin use.   . Latex Shortness Of Breath    Patient Measurements: Height: 5\' 10"  (177.8 cm) Weight: 139 lb 12.4 oz (63.4 kg) IBW/kg (Calculated) : 73   Vital Signs: Temp: 97.5 F (36.4 C) (02/12 0632) Temp Source: Oral (02/12 0632) BP: 169/94 (02/12 [id number]) Pulse Rate: 58 (02/12 0632)  Labs:  Recent Labs  11/04/16 0207 11/04/16 1339 11/04/16 1844 11/05/16 0411  HGB 8.8*  --   --  10.0*  HCT 27.5*  --   --  30.6*  PLT 125*  --   --  139*  HEPARINUNFRC 0.43 0.45 0.46 0.37  CREATININE 0.76  --   --  0.73    Estimated Creatinine Clearance: 93.6 mL/min (by C-G formula based on SCr of 0.73 mg/dL).   Assessment: 86 YOM with new PE and DVT s/p IVC filter on 11/04/16.  Heparin level hasn't been drawn this AM and patient to transition to Eliquis.  No bleeding reported.   Goal of Therapy:  Appropriate anticoagulation    Plan:  - Due to DDI with [place], will reduce Eliquis to 5mg  PO BID x 6 days, then 2.5mg  PO BID - Eliquis 10mg  dose already given today, so count today as completed    Devyn Sheerin D. Mina Marble, PharmD, BCPS Pager:  [phone] 11/06/2016, 2:24 PM

## 2016-11-06 NOTE — Evaluation (Signed)
Occupational Therapy Evaluation Patient Details Name: [name] Herring MRN: [id number] DOB: [birth year] Today's Date: 11/06/2016    History of Present Illness 56 y.o. male with medical history significant of hypertension, schizophrenia, personality disorder, depression, HIV (CD4 470 and VL<20 on 07/16/16), tobacco abuse, cocaine abuse, alcohol abuse, who presented with chest pain.  He was admitted with bilat PEs and BLE DVTs. Pt underwent IVC filter placement 11-04-16.   Clinical Impression   Pt admitted as above, currently demonstrating deficits in his ability to perform ADL and self care tasks (see OT Problem list below) lives alone. Pt is overall +2 assist for safety, decreased safety awareness and awareness of deficits. Min A (posterior and right lateral lean) sitting balance during ADL's, Mod A for sit-stand (retropulsion). Will follow for acute OT.    Follow Up Recommendations  SNF;Supervision/Assistance - 24 hour    Equipment Recommendations  Other (comment) (Defer to next venue)    Recommendations for Other Services       Precautions / Restrictions Precautions Precautions: Fall Restrictions Weight Bearing Restrictions: No      Mobility Bed Mobility Overal bed mobility: Needs Assistance Bed Mobility: Sit to Supine;Supine to Sit     Supine to sit: Min assist;HOB elevated Sit to supine: Min assist   General bed mobility comments: verbal cues for sequencing and assist from pad and rails to scoot up in bed.  Transfers Overall transfer level: Needs assistance Equipment used: Rolling walker (2 wheeled) Transfers: Sit to/from [facility] Sit to Stand: Mod assist Stand pivot transfers: Mod assist       General transfer comment: verbal cues for hand placement and sequencing. Retropulsive with initial stance. Poor safety awareness, flat affect    Balance Overall balance assessment: Needs assistance Sitting-balance support: No upper extremity  supported;Feet supported Sitting balance-Leahy Scale: Poor Sitting balance - Comments: Fair-poor during grooming while sitting EOB, posterior and right later lean noted Postural control: Posterior lean;Right lateral lean Standing balance support: Bilateral upper extremity supported;During functional activity Standing balance-Leahy Scale: Poor                              ADL Overall ADL's : Needs assistance/impaired     Grooming: Wash/dry hands;Wash/dry face;Applying deodorant;Set up;Min guard;Sitting Grooming Details (indicate cue type and reason): Min guard assist during sitting EOB for grooming Upper Body Bathing: Set up;Min guard;Sitting   Lower Body Bathing: Moderate assistance;Sit to/from stand;+2 for safety/equipment   Upper Body Dressing : Minimal assistance;Sitting;Set up   Lower Body Dressing: Moderate assistance;Sit to/from stand Lower Body Dressing Details (indicate cue type and reason): Pt able to reach socks to remove, however, sitting balance is fiar-poor, therefore Mod A during fucntional activity Toilet Transfer: Moderate assistance;BSC;Stand-pivot;RW;+2 for safety/equipment   Toileting- Clothing Manipulation and Hygiene: Moderate assistance;+2 for safety/equipment;Sitting/lateral lean;Sit to/from stand       Functional mobility during ADLs: Moderate assistance;Rolling walker;+2 for safety/equipment;+2 for physical assistance General ADL Comments: Pt was educated in role of OT, assessment was performed. Pt then participated in ADL retraining session for activity tolerance, sitting balance during bathing, grooming, bed mobility etc. Pt lives alone and should benfit from SNF rehab following acute stay as he lives alone & is currently Mod A +2 for safety and balance.     Vision  Wears glasses for reading. No change from baseline.   Perception     Praxis      Pertinent Vitals/Pain Pain Assessment: 0-10 Pain  Score: 9  Pain Descriptors / Indicators:  Aching;Sore;Tender Pain Intervention(s): Limited activity within patient's tolerance;Monitored during session;Premedicated before session;Repositioned     Hand Dominance Right   Extremity/Trunk Assessment Upper Extremity Assessment Upper Extremity Assessment: Generalized weakness   Lower Extremity Assessment Lower Extremity Assessment: Generalized weakness;Defer to PT evaluation   Cervical / Trunk Assessment Cervical / Trunk Assessment: Kyphotic   Communication Communication Communication: No difficulties   Cognition Arousal/Alertness: Awake/alert Behavior During Therapy: Flat affect;WFL for tasks assessed/performed, decreased safety awareness, decreased awareness of deficits. Overall Cognitive Status: No family/caregiver present to determine baseline cognitive functioning                 General Comments: Pt appears to have mild confusion. Unsure of his baseline.    General Comments       Exercises       Shoulder Instructions      Home Living Family/patient expects to be discharged to:: Private residence Living Arrangements: Alone   Type of Home: Apartment Home Access: Level entry     Home Layout: One level     Bathroom Shower/Tub: Tub/shower unit Shower/tub characteristics: Architectural technologist: Standard     Home Equipment: None          Prior Functioning/Environment Level of Independence: Independent                 OT Problem List: Decreased strength;Impaired balance (sitting and/or standing);Decreased cognition;Pain;Decreased knowledge of precautions;Decreased safety awareness;Cardiopulmonary status limiting activity;Decreased activity tolerance;Decreased knowledge of use of DME or AE;Impaired UE functional use   OT Treatment/Interventions: Self-care/ADL training;DME and/or AE instruction;Therapeutic activities;Balance training;Therapeutic exercise;Energy conservation;Patient/family education    OT Goals(Current goals can be found in  the care plan section) Acute Rehab OT Goals Patient Stated Goal: Home but open to SNF Rehab if necessary Time For Goal Achievement: 11/20/16 Potential to Achieve Goals: Good  OT Frequency: Min 2X/week   Barriers to D/C: Decreased caregiver support;Other (comment) (Lives alone)          Co-evaluation              End of Session Equipment Utilized During Treatment: Gait belt;Rolling walker Nurse Communication: Mobility status;Other (comment) (ADL's completed and pt resting in bed)  Activity Tolerance: Patient tolerated treatment well;No increased pain;Patient limited by fatigue;Patient limited by lethargy Patient left: in bed;with call bell/phone within reach;with bed alarm set   Time: [id number] OT Time Calculation (min): 26 min Charges:  OT General Charges $OT Visit: 1 Procedure OT Evaluation $OT Eval Moderate Complexity: 1 Procedure OT Treatments $Self Care/Home Management : 8-22 mins G-Codes:    Josephine Igo Dixon, OTR/L 11/06/2016, 9:09 AM

## 2016-11-06 NOTE — Progress Notes (Signed)
Clinical Social Worker met patient at bedside to offer support and discuss patients needs at discharge. Patient stated that he lives at home by himself and his only support system is his sister who lives in [place], [place]. Patient stated he is not agreeable for SNF placement and would prefer to discharge home. Patient stated he is able to function at home on his own. Patient stated he would be open to [facility]. Nurse case manger Steffanie Dunn is aware of patients refusal of SNF placement. CSW is signing off.  Rhea Pink, MSW,  [place]

## 2016-11-06 NOTE — Progress Notes (Addendum)
ANTICOAGULATION CONSULT NOTE - Follow Up Consult  Pharmacy Consult:  Heparin >> Eliquis Indication:  New PE / DVT  Allergies  Allergen Reactions  . Amoxicillin Shortness Of Breath and Rash    Has patient had a PCN reaction causing immediate rash, facial/tongue/throat swelling, [name] Has patient had a PCN reaction causing severe rash involving mucus membranes or skin necrosis: no Has patient had a PCN reaction that required hospitalization: yes drs office visit Has patient had a PCN reaction occurring within the last 10 years: no If all of the above answers are "NO", then may proceed with Cephalosporin use.   . Latex Shortness Of Breath    Patient Measurements: Height: 5\' 10"  (177.8 cm) Weight: 139 lb 12.4 oz (63.4 kg) IBW/kg (Calculated) : 73   Vital Signs: Temp: 97.5 F (36.4 C) (02/12 0632) Temp Source: Oral (02/12 0632) BP: 169/94 (02/12 [id number]) Pulse Rate: 58 (02/12 0632)  Labs:  Recent Labs  11/03/16 1347  11/04/16 0207 11/04/16 1339 11/04/16 1844 11/05/16 0411  HGB  --   --  8.8*  --   --  10.0*  HCT  --   --  27.5*  --   --  30.6*  PLT  --   --  125*  --   --  139*  LABPROT 15.8*  --   --   --   --   --   INR 1.25  --   --   --   --   --   HEPARINUNFRC  --   < > 0.43 0.45 0.46 0.37  CREATININE  --   --  0.76  --   --  0.73  < > = values in this interval not displayed.  Estimated Creatinine Clearance: 93.6 mL/min (by C-G formula based on SCr of 0.73 mg/dL).   Assessment: 86 YOM with new PE and DVT s/p IVC filter on 11/04/16.  Heparin level hasn't been drawn this AM and patient to transition to Eliquis.  No bleeding reported.   Goal of Therapy:  Appropriate anticoagulation    Plan:  - Eliquis 10mg  PO BID x 7 days, then on 11/13/16 start 5mg  PO BID - Pharmacy will sign off and follow peripherally.  Thank you for the consult!    Keshav Winegar D. Mina Marble, PharmD, BCPS Pager:  [phone] 11/06/2016, 1:28 PM

## 2016-11-06 NOTE — Care Management Note (Signed)
Case Management Note Marvetta Gibbons RN, BSN Unit 2W-Case Manager [phone]  Patient Details  Name: BURLIE ROSCHER MRN: [id number] Date of Birth: 1961-04-11  Subjective/Objective:  Pt admitted with PE/DVTs, s/p IVC filter                   Action/Plan: PTA pt lived at home alone, per PT eval recommendations for STSNF- CSW consulted- pt however does not want SNF and wants to return home- will set up PCP, and arrange [facility] services once orders placed, pt will need RW prior to discharge. Pt has been started on Eliquis- per pt he has Medicaid and gets his medications from a pharmacy that delivers out of [place]. Pt reports that he does not have difficulty getting his medications. 30 day free card given to pt to use for Eliquis on discharge. Pt also reports that he will take a taxi home for transportation.   Expected Discharge Date:                  Expected Discharge Plan:  [place]  In-House Referral:  Clinical Social Work  Discharge planning Services  CM Consult, [facility], Follow-up appt scheduled, Medication Assistance  Post Acute Care Choice:  Home Health, Durable Medical Equipment Choice offered to:  Patient  DME Arranged:  [facility] rolling DME Agency:  [place]:    [place] Agency:     Status of Service:  In process, will continue to follow  If discussed at Long Length of Stay Meetings, dates discussed:    Additional Comments:  Dawayne Patricia, RN 11/06/2016, 2:23 PM

## 2016-11-06 NOTE — Care Management Important Message (Signed)
Important Message  Patient Details  Name: [name] Herring MRN: [id number] Date of Birth: May 14, 1961   Medicare Important Message Given:  Yes    Melina Mosteller Montine Circle 11/06/2016, 4:14 PM

## 2016-11-07 LAB — CBC
HCT: 34.3 % — ABNORMAL LOW (ref 39.0–52.0)
HEMOGLOBIN: 11.2 g/dL — AB (ref 13.0–17.0)
MCH: 32.5 pg (ref 26.0–34.0)
MCHC: 32.7 g/dL (ref 30.0–36.0)
MCV: 99.4 fL (ref 78.0–100.0)
Platelets: 142 10*3/uL — ABNORMAL LOW (ref 150–400)
RBC: 3.45 MIL/uL — ABNORMAL LOW (ref 4.22–5.81)
RDW: 16.2 % — AB (ref 11.5–15.5)
WBC: 4.1 10*3/uL (ref 4.0–10.5)

## 2016-11-07 LAB — BASIC METABOLIC PANEL
Anion gap: 10 (ref 5–15)
BUN: 8 mg/dL (ref 6–20)
CALCIUM: 9.3 mg/dL (ref 8.9–10.3)
CHLORIDE: 104 mmol/L (ref 101–111)
CO2: 24 mmol/L (ref 22–32)
CREATININE: 0.95 mg/dL (ref 0.61–1.24)
GFR calc Af Amer: >60 mL/min (ref >60)
GFR calc non Af Amer: >60 mL/min (ref >60)
GLUCOSE: 86 mg/dL (ref 65–99)
Potassium: 4.3 mmol/L (ref 3.5–5.1)
Sodium: 138 mmol/L (ref 135–145)

## 2016-11-07 LAB — CULTURE, BLOOD (ROUTINE X 2)
Culture: NO GROWTH
Culture: NO GROWTH

## 2016-11-07 MED ORDER — NICOTINE 21 MG/24HR TD PT24: 21.0000 mg | MEDICATED_PATCH | Freq: Every day | TRANSDERMAL | 0 refills | Status: DC

## 2016-11-07 MED ORDER — ACETAMINOPHEN 500 MG PO TABS: 500.0000 mg | ORAL_TABLET | Freq: Four times a day (QID) | ORAL | 0 refills | Status: AC | PRN

## 2016-11-07 MED ORDER — MAGNESIUM OXIDE 400 (241.3 MG) MG PO TABS: 400.0000 mg | ORAL_TABLET | Freq: Two times a day (BID) | ORAL | 0 refills | Status: DC

## 2016-11-07 MED ORDER — FOLIC ACID 1 MG PO TABS: 1.0000 mg | ORAL_TABLET | Freq: Every day | ORAL | Status: DC

## 2016-11-07 MED ORDER — TIOTROPIUM BROMIDE MONOHYDRATE 18 MCG IN CAPS: 18.0000 ug | ORAL_CAPSULE | Freq: Every day | RESPIRATORY_TRACT | 6 refills | Status: DC

## 2016-11-07 MED ORDER — THIAMINE HCL 100 MG PO TABS: 100.0000 mg | ORAL_TABLET | Freq: Every day | ORAL | Status: DC

## 2016-11-07 MED ORDER — BUDESONIDE-FORMOTEROL FUMARATE 160-4.5 MCG/ACT IN AERO: 2.0000 | INHALATION_SPRAY | Freq: Two times a day (BID) | RESPIRATORY_TRACT | 6 refills | Status: DC

## 2016-11-07 MED ORDER — ADULT MULTIVITAMIN W/MINERALS CH
1.0000 | ORAL_TABLET | Freq: Every day | ORAL | Status: DC
Start: 2016-11-08 — End: 2019-11-15

## 2016-11-07 MED ORDER — APIXABAN 2.5 MG PO TABS: 2.5000 mg | ORAL_TABLET | Freq: Two times a day (BID) | ORAL | 3 refills | Status: DC

## 2016-11-07 MED ORDER — OXYCODONE-ACETAMINOPHEN 5-325 MG PO TABS: 1.0000 | ORAL_TABLET | ORAL | 0 refills | Status: DC | PRN

## 2016-11-07 MED ORDER — APIXABAN 5 MG PO TABS: 5.0000 mg | ORAL_TABLET | Freq: Two times a day (BID) | ORAL | 0 refills | Status: DC

## 2016-11-07 NOTE — Progress Notes (Signed)
Clinical Social Worker assisted patient with a cab voucher back to residence. Patient has no more needs at this time. CSW signing off.   Rhea Pink, MSW,  [place]

## 2016-11-07 NOTE — Care Management Note (Signed)
Case Management Note Marvetta Gibbons RN, BSN Unit 2W-Case Manager [phone]  Patient Details  Name: [name] Herring MRN: [id number] Date of Birth: 03-30-1961  Subjective/Objective:  Pt admitted with PE/DVTs, s/p IVC filter                   Action/Plan: PTA pt lived at home alone, per PT eval recommendations for STSNF- CSW consulted- pt however does not want SNF and wants to return home- will set up PCP, and arrange [facility] services once orders placed, pt will need RW prior to discharge. Pt has been started on Eliquis- per pt he has Medicaid and gets his medications from a pharmacy that delivers out of [place]. Pt reports that he does not have difficulty getting his medications. 30 day free card given to pt to use for Eliquis on discharge. Pt also reports that he will take a taxi home for transportation.   Expected Discharge Date:                  Expected Discharge Plan:  [place]  In-House Referral:  Clinical Social Work  Discharge planning Services  CM Consult, [facility], Follow-up appt scheduled, Medication Assistance  Post Acute Care Choice:  Home Health, Durable Medical Equipment Choice offered to:  Patient  DME Arranged:  [facility] rolling DME Agency:  [place] Arranged:  RN, PT, OT, Nurse's Aide, Social Work [employer] Agency:  [place]  Status of Service:  Completed, signed off  If discussed at [employer] of Stay Meetings, dates discussed:    Discharge Disposition: home with home health   Additional Comments:   11/07/16- 1000-  Marvetta Gibbons RN, CM- orders have been placed for HHRN/PT/OT/aide/SW- spoke with pt at bedside to offer choice for [facility] services- per pt he does not have a preference- pt is ok with using any agency that can provide services- referral called to [facility] with [facility]- referral accepted-  Per pt he goes to Dr. Johnnye Sima for most of his needs and medications- does not have a primary care  but is open to having an appointment made at one of the clinics - CM will call to see about a f/u appointment with pt -call made to [facility]- f/u appointment available for Feb. 15 at 2:00 with Dr. Luci Bank made and given to pt along with info on clinic- pt will need to speak with clinic about establishing care there with PCP.  notified Brad with [facility] for DME need- RW to be delivered to room prior to discharge- CSW to see pt regarding transportation needs- pt states he will need taxi for home.   Dawayne Patricia, RN 11/07/2016, 10:00 AM

## 2016-11-07 NOTE — Discharge Summary (Signed)
Physician Discharge Summary  [name] Herring [id number] DOB: 18-Apr-1961 DOA: 11/02/2016  PCP: No PCP Per Patient  Admit date: 11/02/2016 Discharge date: 11/07/2016  Time spent: 65 minutes  Recommendations for Outpatient Follow-up:  1. Follow-up at [facility] on 11/09/2016. On follow-up patient will need a basic metabolic profile done to follow-up on electrolytes and renal function, magnesium level, CBC to follow-up on H&H. 2. Follow-up with Dr. Johnnye Sima, infectious diseases on 11/13/2016 as previously scheduled. 3. Patient be discharged home with home health.   Discharge Diagnoses:  Principal Problem:   PE (pulmonary thromboembolism) ([place]) Active Problems:   DVT of lower extremity, bilateral (HCC)   Human immunodeficiency virus (HIV) disease (HCC)   Cocaine use disorder, severe, dependence ([place])   Alcohol use disorder, severe, dependence ([place])   Paranoid schizophrenia, chronic condition ([place])   Tobacco abuse   Hypotension   Hypokalemia   Elevated lactic acid level   Acute deep vein thrombosis (DVT) of right lower extremity ([place])   Discharge Condition: Stable and improved  Diet recommendation: Regular  Filed Weights   11/02/16 1622 11/02/16 2254  Weight: 63 kg (139 lb) 63.4 kg (139 lb 12.4 oz)    History of present illness:  Per Dr Carolyne Fiscal is a 56 y.o. male with medical history significant of hypertension, schizophrenia, personality disorder, depression, HIV (CD4 77 and VL<20 on 07/16/16), tobacco abuse, cocaine abuse, alcohol abuse, who presented with chest pain.  Patient stated that he had been having chest pain for about 1 week. It was located in the substernal area, take 10 in severity, radiating to the neck, pleuritic, aggravated by cough or deep breath. He had mild SOB. No calf tenderness. He had dry cough, but no fever or chills. Patient does not have flu symptoms. Denied nausea, vomiting, diarrhea, abdominal pain, symptoms of  UTI or unilateral weakness. Per report, on EMS arrival he was hypotensive with systolic bp in the [id number] with fever of 100.7, heart rate 88, with etoh on board.   ED Course: pt was found to have positive d-dimer 7.98, lactate 4.38, WBC 5.3, potassium is 3.0, creatinine 1.11, no tachycardia, oxygen saturation 93% on room air, chest x-ray showed interstitial coarsing and vascular congestion. CTA of chest showed bilateral submassive PE with evidence of right heart strain. Pt is admitted to SDU as inpt. IV heparin was started.   Hospital Course:  #1 bilateral submassive PE with evidence of right heart strain/with acute mobile DVT in left proximal popliteal vein On admission patient was noted to be hypotensive with improvement with blood pressure with IV fluids. Patient was admitted and placed in the step down unit where he was monitored.  2-D echo with EF of 50-55%, no wall motion abnormalities, grade 1 diastolic dysfunction, right ventricular cavity size moderately dilated with normal wall thickness with mildly reduced systolic function.  Bilateral lower extremity Dopplers positive for acute DVT in the branch of the gastrocnemius vein, acute mobile DVT involving proximal popliteal vein, DVT of the distal to mid posterior tibial vein of the left lower extremity. Cardiac enzymes negative. BNP at 45.4. Critical care was consulted who recommended placement of IVC filter per interventional radiology which was done on 11/04/2016 successfully. Patient was maintained on IV heparin initially during the hospitalization. Post IVC filter placement patient was subsequently transitioned to eliquis 5 mg twice a day 1 week and then 2.5 mg twice daily thereafter. Patient will follow-up with PCP in the outpatient setting.   #2 HIV  Last CD4 count 470 with a viral load is < 20 on 07/16/2016. Continued on home HIV medications. Outpatient follow-up as scheduled on 11/13/2016 with Dr. Johnnye Sima.  #3 alcohol abuse Alcohol  cessation. Patient with tremors. Less diaphoretic. Patient currently on Ativan withdrawal protocol. Folic acid, thiamine, multivitamin. Follow.  #3 tobacco abuse/severe COPD per CT chest Tobacco cessation. Nicotine patch. Patient was placed on Spiriva and Pulmicort nebs and Mucinex during the hospitalization as well as duo nebs as needed. Patient remained in stable condition. Patient will be discharged on Spiriva and Symbicort. Outpatient follow-up.  #4 paranoid schizophrenia/depression No suicidal or homicidal ideation. Continued on home regimen of Remeron.  #5 hypotension Secondary to problem #1. Improved with IV fluids. IV fluids were discontinued. Patient's blood pressure remained stable. Outpatient follow-up.   #6 hypokalemia Repleted. Magnesium at 2.0. Patient was also placed on magnesium oxide. Patient be discharged home on his home regimen of oral potassium supplementation. Outpatient follow-up.   #7 anemia Patient with no overt bleeding. Anemia panel consistent with anemia of chronic disease. Hemoglobin remained stable throughout the hospitalization.   #8 debility PT/OT. Patient needs skilled nursing facility however patient refused placement to skilled nursing facility. Patient will be discharged home with home health therapies. Outpatient follow-up.   Procedures:  CT angiogram chest 11/02/2016  Chest x-ray 11/02/2016  2-D echo 11/03/2016  Lower extremity Dopplers 11/03/2016   IVC filter placement Dr. Kathlene Cote 11/04/2016   Consultations:  Interventional radiology Dr. Pascal Lux 11/03/2016  PCCM Dr. Nelda Marseille 11/03/2016  Discharge Exam: Vitals:   11/07/16 0424 11/07/16 1004  BP: 136/82   Pulse: (!) 59 99  Resp: 16   Temp: 97.7 F (36.5 C)     General: NAD Cardiovascular: RRR Respiratory: CTAB  Discharge Instructions   Discharge Instructions    Diet general    Complete by:  As directed    Discharge instructions    Complete by:  As directed    Stop  drinking alcohol. Stop smoking.   Increase activity slowly    Complete by:  As directed      Current Discharge Medication List    START taking these medications   Details  !! apixaban (ELIQUIS) 2.5 MG TABS tablet Take 1 tablet (2.5 mg total) by mouth 2 (two) times daily. Qty: 60 tablet, Refills: 3    !! apixaban (ELIQUIS) 5 MG TABS tablet Take 1 tablet (5 mg total) by mouth 2 (two) times daily. Take for 6 days then start other prescription for 2.5 mg 2 times daily. Qty: 10 tablet, Refills: 0    budesonide-formoterol (SYMBICORT) 160-4.5 MCG/ACT inhaler Inhale 2 puffs into the lungs 2 (two) times daily. Qty: 1 Inhaler, Refills: 6    folic acid (FOLVITE) 1 MG tablet Take 1 tablet (1 mg total) by mouth daily.    magnesium oxide (MAG-OX) 400 (241.3 Mg) MG tablet Take 1 tablet (400 mg total) by mouth 2 (two) times daily. Qty: 60 tablet, Refills: 0    Multiple Vitamin (MULTIVITAMIN WITH MINERALS) TABS tablet Take 1 tablet by mouth daily.    nicotine (NICODERM CQ - DOSED IN MG/24 HOURS) 21 mg/24hr patch Place 1 patch (21 mg total) onto the skin daily. Qty: 28 patch, Refills: 0    oxyCODONE-acetaminophen (PERCOCET/ROXICET) 5-325 MG tablet Take 1 tablet by mouth every 4 (four) hours as needed for moderate pain. Qty: 15 tablet, Refills: 0    thiamine 100 MG tablet Take 1 tablet (100 mg total) by mouth daily.    tiotropium (SPIRIVA) 18  MCG inhalation capsule Place 1 capsule (18 mcg total) into inhaler and inhale daily. Qty: 30 capsule, Refills: 6     !! - Potential duplicate medications found. Please discuss with provider.    CONTINUE these medications which have CHANGED   Details  acetaminophen (TYLENOL) 500 MG tablet Take 1 tablet (500 mg total) by mouth every 6 (six) hours as needed for moderate pain. Qty: 30 tablet, Refills: 0      CONTINUE these medications which have NOT CHANGED   Details  benztropine (COGENTIN) 1 MG tablet Take 1 tablet (1 mg total) by mouth at  bedtime. Qty: 14 tablet, Refills: 0    elvitegravir-cobicistat-emtricitabine-tenofovir (GENVOYA) 150-150-200-10 MG TABS tablet TAKE 1 TABLET BY MOUTH DAILY WITH BREAKFAST. *CALL [phone] to see Dr Johnnye Sima ASAP* Otho Darner: 30 tablet, Refills: 1   Associated Diagnoses: HIV disease ([place])    gabapentin (NEURONTIN) 600 MG tablet Take 600 mg by mouth at bedtime.    ibuprofen (ADVIL,MOTRIN) 200 MG tablet Take 400 mg by mouth every 6 (six) hours as needed.    mirtazapine (REMERON) 15 MG tablet Take 15 mg by mouth at bedtime.    potassium chloride SA (K-DUR,KLOR-CON) 20 MEQ tablet Take 1 tablet (20 mEq total) by mouth 3 (three) times daily. Qty: 20 tablet, Refills: 0       Allergies  Allergen Reactions  . Amoxicillin Shortness Of Breath and Rash    Has patient had a PCN reaction causing immediate rash, facial/tongue/throat swelling, [name] Has patient had a PCN reaction causing severe rash involving mucus membranes or skin necrosis: no Has patient had a PCN reaction that required hospitalization: yes drs office visit Has patient had a PCN reaction occurring within the last 10 years: no If all of the above answers are "NO", then may proceed with Cephalosporin use.   . Latex Shortness Of Breath   Follow-up Information    [facility] Follow up.   Specialty:  [place] Why:  HHRN/PT/OT/aide/social work arranged- they will call you to set up home visits Contact information: [address] [place] [address] [phone]        [facility] Follow up.   Why:  rolling walker arranged- to be delivered to room prior to discharge.  Contact information: [place] [id number] [place] Follow up on 11/09/2016.   Why:  f/u appointment made with DR. McClung for 2:00pm- will need to speak with clinic about establishing care with  primary care doctor.  Contact information: [place] [phone]       Bobby Rumpf, MD Follow up on 11/13/2016.   Specialty:  Infectious Diseases Why:  f/u as scheduled on 11/13/2016 Contact information: [address] [place]  [address] [phone]            The results of significant diagnostics from this hospitalization (including imaging, microbiology, ancillary and laboratory) are listed below for reference.    Significant Diagnostic Studies: Dg Chest 2 View  Result Date: 11/02/2016 CLINICAL DATA:  Generalized chest pain and shortness of breath. EXAM: CHEST  2 VIEW COMPARISON:  10/20/2016 FINDINGS: The lungs are clear wiithout focal pneumonia, edema, pneumothorax or pleural effusion. Interstitial markings are diffusely coarsened with chronic features. The cardio pericardial silhouette is enlarged. There is pulmonary vascular congestion without overt pulmonary edema.  IMPRESSION: Cardiomegaly with vascular congestion. Chronic interstitial coarsening. Electronically Signed   By: Misty Stanley M.D.   On: 11/02/2016 18:14   Dg Chest 2 View  Result Date: 10/20/2016 CLINICAL DATA:  Initial evaluation for acute chest pain, diaphoresis. EXAM: CHEST  2 VIEW COMPARISON:  Prior radiograph from 07/15/2016. FINDINGS: Mild cardiomegaly, stable. Mediastinal silhouette within normal limits. Lungs normally inflated. There are patchy and hazy opacities within the right basilar region, which may reflect sequela of acute infectious or possibly aspiration pneumonitis. No other focal airspace disease. No pulmonary edema or pleural effusion. No pneumothorax. No acute osseus abnormality. IMPRESSION: Patchy and hazy right basilar opacities, which may reflect sequela of acute infectious or aspiration pneumonitis. Electronically Signed   By: Jeannine Boga M.D.   On: 10/20/2016 05:17   Ct Angio Chest Pe W And/or Wo Contrast  Result  Date: 11/02/2016 CLINICAL DATA:  Chest pain for 6 months, worsening for a few days. History of hypertension, HIV, pneumonia. EXAM: CT ANGIOGRAPHY CHEST WITH CONTRAST TECHNIQUE: Multidetector CT imaging of the chest was performed using the standard protocol during bolus administration of intravenous contrast. Multiplanar CT image reconstructions and MIPs were obtained to evaluate the vascular anatomy. CONTRAST:  100 cc Isovue 370 COMPARISON:  Chest radiograph November 02, 2016 at 7:59 hours. FINDINGS: CARDIOVASCULAR: Adequate contrast opacification of the pulmonary artery's. Main pulmonary artery is mildly enlarged at 3.3 cm. LEFT upper lobe segmental to subsegmental occlusive pulmonary embolus. LEFT lower lobe nonocclusive segmental the subsegmental central filling defects. Large central embolus RIGHT middle lobe pulmonary artery, occlusive at the segmental level. Larger RIGHT lower lobar pulmonary embolus casting into the segmental and subsegmental pulmonary arteries, occlusive distally. Heart size is mildly enlarged. RIGHT heart strain (RV/ LV 1.1) No pericardial effusions. Thoracic aorta is normal course and caliber, mild calcific atherosclerosis of the aortic arch. Mild coronary artery calcifications. MEDIASTINUM/NODES: No lymphadenopathy by CT size criteria. LUNGS/PLEURA: Tracheobronchial tree is patent, no pneumothorax. Severe centrilobular emphysema. Dependent atelectasis without pleural effusion or focal consolidation. UPPER ABDOMEN: Included view of the abdomen is unremarkable. MUSCULOSKELETAL: Visualized soft tissues and included osseous structures appear normal. Review of the MIP images confirms the above findings. IMPRESSION: Extensive bilateral acute pulmonary emboli, including occlusive emboli. CT evidence of right heart strain (RV/LV Ratio = 1.1) consistent with at least submassive (intermediate risk) PE. The presence of right heart strain has been associated with an increased risk of morbidity and  mortality. Please activate Code PE by paging [phone]. Mild cardiomegaly.  Pulmonary vascular congestion. Severe COPD. Critical Value/emergent results were called by telephone at the time of interpretation on 11/02/2016 at 7:36 pm to Dr. Nanda Quinton , who verbally acknowledged these results. Electronically Signed   By: Elon Alas M.D.   On: 11/02/2016 19:38   Ir Ivc Filter Plmt / S&i /img Guid/mod Sed  Result Date: 11/04/2016 CLINICAL DATA:  Sub massive bilateral pulmonary embolism and bilateral lower extremity DVT. Clinical indication for IVC filter placement. EXAM: 1. ULTRASOUND GUIDANCE FOR VASCULAR ACCESS OF THE RIGHT INTERNAL JUGULAR VEIN. 2. IVC VENOGRAM. 3. PERCUTANEOUS IVC FILTER PLACEMENT. ANESTHESIA/SEDATION: 1.0 mg IV Versed; 25 mcg IV Fentanyl. Total Moderate Sedation Time: 15 minutes. The patient's level of consciousness and physiologic status were continuously monitored during the procedure by Radiology nursing. A time-out was performed prior to initiating the procedure. CONTRAST:  50mL ISOVUE-300 IOPAMIDOL (ISOVUE-300) INJECTION 61% FLUOROSCOPY TIME:  48 seconds.  3.4 mGy. PROCEDURE: The procedure, risks, benefits, and alternatives were explained to the patient. Questions regarding  the procedure were encouraged and answered. The patient understands and consents to the procedure. A time-out was performed prior to initiating the procedure. The right neck was prepped with chlorhexidine in a sterile fashion, and a sterile drape was applied covering the operative field. A sterile gown and sterile gloves were used for the procedure. Local anesthesia was provided with 1% Lidocaine. Ultrasound was utilized to confirm patency of the right internal jugular vein. Under direct ultrasound guidance, a 21 gauge needle was advanced into the right internal jugular vein with ultrasound image documentation performed. After securing access with a micropuncture dilator, a guidewire was advanced into the  inferior vena cava. A deployment sheath was advanced over the guidewire. This was utilized to perform IVC venography. The deployment sheath was further positioned in an appropriate location for filter deployment. A Bard Denali IVC filter was then advanced in the sheath. This was then fully deployed in the infrarenal IVC. Final filter position was confirmed with a fluoroscopic spot image. After the procedure the sheath was removed and hemostasis obtained with manual compression. COMPLICATIONS: None. FINDINGS: IVC venography demonstrates a normal caliber IVC with no evidence of thrombus. Renal veins are identified bilaterally. The IVC filter was successfully positioned below the level of the renal veins and is appropriately oriented. This IVC filter has both permanent and retrievable indications. IMPRESSION: Placement of percutaneous IVC filter in infrarenal IVC. IVC venogram shows no evidence of IVC thrombus and normal caliber of the inferior vena cava. This filter does have both permanent and retrievable indications. PLAN: This IVC filter is potentially retrievable. The patient will be assessed for filter retrieval by Interventional Radiology in approximately 8-12 weeks. Further recommendations regarding filter retrieval, continued surveillance or declaration of device permanence, will be made at that time. Electronically Signed   By: Aletta Edouard M.D.   On: 11/04/2016 10:58    Microbiology: Recent Results (from the past 240 hour(s))  Blood Culture (routine x 2)     Status: None (Preliminary result)   Collection Time: 11/02/16  6:45 PM  Result Value Ref Range Status   Specimen Description BLOOD LEFT HAND  Final   Special Requests IN PEDIATRIC BOTTLE 2CC  Final   Culture NO GROWTH 4 DAYS  Final   Report Status PENDING  Incomplete  Blood Culture (routine x 2)     Status: None (Preliminary result)   Collection Time: 11/02/16  6:52 PM  Result Value Ref Range Status   Specimen Description BLOOD LEFT  FOREARM  Final   Special Requests IN PEDIATRIC BOTTLE 2CC  Final   Culture NO GROWTH 4 DAYS  Final   Report Status PENDING  Incomplete  MRSA PCR Screening     Status: None   Collection Time: 11/02/16 10:50 PM  Result Value Ref Range Status   MRSA by PCR NEGATIVE NEGATIVE Final    Comment:        The GeneXpert MRSA Assay (FDA approved for NASAL specimens only), is one component of a comprehensive MRSA colonization surveillance program. It is not intended to diagnose MRSA infection nor to guide or monitor treatment for MRSA infections.      Labs: Basic Metabolic Panel:  Recent Labs Lab 11/03/16 0157 11/03/16 0838 11/04/16 0207 11/05/16 0411 11/06/16 1400 11/07/16 0258  NA  --  144 140 137 137 138  K  --  4.0 4.6 3.6 3.4* 4.3  CL  --  113* 109 103 104 104  CO2  --  24 23 24 26  24  GLUCOSE  --  85 80 85 105* 86  BUN  --  10 9 <5* <5* 8  CREATININE  --  0.79 0.76 0.73 0.90 0.95  CALCIUM  --  8.5* 8.3* 8.8* 9.7 9.3  MG 1.8  --   --  2.0  --   --    Liver Function Tests:  Recent Labs Lab 11/02/16 1706 11/04/16 0207 11/05/16 0411  AST 39 58* 25  ALT 19 23 18   ALKPHOS 44 45 51  BILITOT 0.7 0.9 0.7  PROT 5.3* 4.6* 5.3*  ALBUMIN 2.7* 2.3* 2.4*    Recent Labs Lab 11/02/16 1706  LIPASE 16   No results for input(s): AMMONIA in the last 168 hours. CBC:  Recent Labs Lab 11/02/16 1706 11/03/16 0157 11/04/16 0207 11/05/16 0411 11/07/16 0258  WBC 5.3 3.0* 3.5* 4.2 4.1  NEUTROABS 3.3  --   --   --   --   HGB 8.8* 8.3* 8.8* 10.0* 11.2*  HCT 26.5* 25.3* 27.5* 30.6* 34.3*  MCV 99.3 100.8* 103.0* 99.7 99.4  PLT 145* 121* 125* 139* 142*   Cardiac Enzymes:  Recent Labs Lab 11/02/16 2043 11/03/16 0157 11/03/16 0838  TROPONINI <0.03 <0.03 <0.03   BNP: BNP (last 3 results)  Recent Labs  11/03/16 0342  BNP 45.4    ProBNP (last 3 results) No results for input(s): PROBNP in the last 8760 hours.  CBG:  Recent Labs Lab 11/03/16 0752 11/04/16 0820  11/05/16 0852  GLUCAP 81 82 90       Signed:  Darletta Noblett MD.  [facility] 11/07/2016, 12:12 PM

## 2016-11-07 NOTE — Progress Notes (Signed)
Per [employer] check for Eliquis Patient has medicaid : co-pay $ 3.70 for each rx

## 2016-11-07 NOTE — Progress Notes (Signed)
Physical Therapy Treatment Patient Details Name: GRAEDEN ORECCHIO MRN: [id number] DOB: 12/29/1960 Today's Date: 11/07/2016    History of Present Illness 56 y.o. male with medical history significant of hypertension, schizophrenia, personality disorder, depression, HIV (CD4 470 and VL<20 on 07/16/16), tobacco abuse, cocaine abuse, alcohol abuse, who presented with chest pain.  He was admitted with bilat PEs and BLE DVTs. Pt underwent IVC filter placement 11-04-16.    PT Comments    Pt pleasant with improved balance, gait, transfers and function. Pt continues to refuse SNF and would benefit from RW, HHPT and intermittent supervision. Pt remains a high fall risk with education to maintain RW for all standing and gait to prevent falls. Will continue to follow.  sats 95% on RA with gait HR 99   Follow Up Recommendations  Home health PT;Supervision - Intermittent (pt refused SNF)     Equipment Recommendations  Rolling walker with 5" wheels    Recommendations for Other Services       Precautions / Restrictions Precautions Precautions: Fall Restrictions Weight Bearing Restrictions: No    Mobility  Bed Mobility Overal bed mobility: Modified Independent                Transfers Overall transfer level: Needs assistance   Transfers: Sit to/from Stand Sit to Stand: Min guard         General transfer comment: cues for hand placement and safety with standing from bed demonstrated safety but with going to chair pt attempting to sit while still facing chair with cues for safety sequence and direction  Ambulation/Gait Ambulation/Gait assistance: Min guard Ambulation Distance (Feet): 400 Feet Assistive device: Rolling walker (2 wheeled) Gait Pattern/deviations: Step-through pattern;Decreased stride length;Trunk flexed   Gait velocity interpretation: Below normal speed for age/gender General Gait Details: cues for position in RW, posture and safety   Stairs Stairs: Yes    Stair Management: Alternating pattern;Forwards;One rail Left Number of Stairs: 11 General stair comments: guarding for safety, no LOB  Wheelchair Mobility    Modified Rankin (Stroke Patients Only)       Balance Overall balance assessment: Needs assistance   Sitting balance-Leahy Scale: Good       Standing balance-Leahy Scale: Fair                      Cognition Arousal/Alertness: Awake/alert Behavior During Therapy: WFL for tasks assessed/performed Overall Cognitive Status: Within Functional Limits for tasks assessed                      Exercises      General Comments        Pertinent Vitals/Pain Pain Assessment: 0-10 Pain Score: 6  Pain Location: chest Pain Descriptors / Indicators: Aching Pain Intervention(s): Limited activity within patient's tolerance    Home Living                      Prior Function            PT Goals (current goals can now be found in the care plan section) Progress towards PT goals: Progressing toward goals    Frequency           PT Plan Discharge plan needs to be updated    Co-evaluation             End of Session Equipment Utilized During Treatment: Gait belt Activity Tolerance: Patient tolerated treatment well Patient left: in chair;with call bell/phone within reach;with  chair alarm set     Time: [phone] PT Time Calculation (min) (ACUTE ONLY): 25 min  Charges:  $Gait Training: 23-37 mins                    G Codes:      Lylee Corrow B Eyad Rochford 11-22-2016, 11:09 AM  Elwyn Reach, [place]

## 2016-11-09 ENCOUNTER — Ambulatory Visit: Payer: Medicare Other | Attending: Internal Medicine | Admitting: Physician Assistant

## 2016-11-09 ENCOUNTER — Telehealth: Payer: Self-pay | Admitting: General Practice

## 2016-11-09 ENCOUNTER — Other Ambulatory Visit: Payer: Self-pay | Admitting: Pharmacist

## 2016-11-09 ENCOUNTER — Inpatient Hospital Stay: Payer: Self-pay

## 2016-11-09 VITALS — BP 117/65 | HR 124 | Temp 98.5°F | Resp 16 | Wt 138.4 lb

## 2016-11-09 DIAGNOSIS — Z7901 Long term (current) use of anticoagulants: Secondary | ICD-10-CM | POA: Insufficient documentation

## 2016-11-09 DIAGNOSIS — I2699 Other pulmonary embolism without acute cor pulmonale: Secondary | ICD-10-CM | POA: Insufficient documentation

## 2016-11-09 DIAGNOSIS — B20 Human immunodeficiency virus [HIV] disease: Secondary | ICD-10-CM | POA: Insufficient documentation

## 2016-11-09 DIAGNOSIS — Z95828 Presence of other vascular implants and grafts: Secondary | ICD-10-CM | POA: Insufficient documentation

## 2016-11-09 DIAGNOSIS — Z91040 Latex allergy status: Secondary | ICD-10-CM | POA: Diagnosis not present

## 2016-11-09 DIAGNOSIS — E876 Hypokalemia: Secondary | ICD-10-CM | POA: Diagnosis not present

## 2016-11-09 DIAGNOSIS — Z79899 Other long term (current) drug therapy: Secondary | ICD-10-CM | POA: Insufficient documentation

## 2016-11-09 DIAGNOSIS — I10 Essential (primary) hypertension: Secondary | ICD-10-CM | POA: Insufficient documentation

## 2016-11-09 DIAGNOSIS — F329 Major depressive disorder, single episode, unspecified: Secondary | ICD-10-CM | POA: Insufficient documentation

## 2016-11-09 DIAGNOSIS — I82403 Acute embolism and thrombosis of unspecified deep veins of lower extremity, bilateral: Secondary | ICD-10-CM | POA: Diagnosis not present

## 2016-11-09 DIAGNOSIS — I82433 Acute embolism and thrombosis of popliteal vein, bilateral: Secondary | ICD-10-CM | POA: Diagnosis not present

## 2016-11-09 DIAGNOSIS — F200 Paranoid schizophrenia: Secondary | ICD-10-CM | POA: Diagnosis not present

## 2016-11-09 DIAGNOSIS — Z880 Allergy status to penicillin: Secondary | ICD-10-CM | POA: Insufficient documentation

## 2016-11-09 LAB — BASIC METABOLIC PANEL
BUN: 14 mg/dL (ref 7–25)
CALCIUM: 10.1 mg/dL (ref 8.6–10.3)
CHLORIDE: 112 mmol/L — AB (ref 98–110)
CO2: 27 mmol/L (ref 20–31)
Creat: 1.15 mg/dL (ref 0.70–1.33)
Glucose, Bld: 86 mg/dL (ref 65–99)
POTASSIUM: 4 mmol/L (ref 3.5–5.3)
SODIUM: 147 mmol/L — AB (ref 135–146)

## 2016-11-09 LAB — CBC WITH DIFFERENTIAL/PLATELET
BASOS PCT: 0 %
Basophils Absolute: 0 cells/uL (ref 0–200)
EOS ABS: 51 {cells}/uL (ref 15–500)
Eosinophils Relative: 1 %
HCT: 33.6 % — ABNORMAL LOW (ref 38.5–50.0)
Hemoglobin: 10.7 g/dL — ABNORMAL LOW (ref 13.2–17.1)
LYMPHS ABS: 1173 {cells}/uL (ref 850–3900)
Lymphocytes Relative: 23 %
MCH: 32.1 pg (ref 27.0–33.0)
MCHC: 31.8 g/dL — ABNORMAL LOW (ref 32.0–36.0)
MCV: 100.9 fL — AB (ref 80.0–100.0)
MONOS PCT: 12 %
MPV: 10.3 fL (ref 7.5–12.5)
Monocytes Absolute: 612 cells/uL (ref 200–950)
NEUTROS ABS: 3264 {cells}/uL (ref 1500–7800)
Neutrophils Relative %: 64 %
PLATELETS: 206 10*3/uL (ref 140–400)
RBC: 3.33 MIL/uL — ABNORMAL LOW (ref 4.20–5.80)
RDW: 16.8 % — ABNORMAL HIGH (ref 11.0–15.0)
WBC: 5.1 10*3/uL (ref 3.8–10.8)

## 2016-11-09 LAB — MAGNESIUM: Magnesium: 2.3 mg/dL (ref 1.5–2.5)

## 2016-11-09 MED FILL — MAGNESIUM OXIDE 400 MG TAB: 400 (240 MG | 30 days supply | Qty: 60 | Fill #0

## 2016-11-09 MED FILL — POTASSIUM CL ER 20 MEQ TAB: 20 | 7 days supply | Qty: 20 | Fill #0

## 2016-11-09 MED FILL — SYMBICORT 160-4.5 MCG INH: 160-4.5 | 30 days supply | Qty: 10 | Fill #0

## 2016-11-09 NOTE — Telephone Encounter (Signed)
Will do.  Please forward me Jesse's contact information.

## 2016-11-09 NOTE — Telephone Encounter (Signed)
Denyse Amass from [place]: [phone]

## 2016-11-09 NOTE — Telephone Encounter (Signed)
Caller states that pt has some descrepencies in meds and needs to have clarification from a provider. I explained that pt is not an established pt here, though he has an appt today with Freeman Caldron for a hfu and upon discharge he may request an appt to establish care here. Caller requests that Freeman Caldron go over meds with pt and call Denyse Amass to clarify med regimen. Patient appt is 11/09/16 at 2:30 p.m.Please f/u

## 2016-11-09 NOTE — Progress Notes (Signed)
[name] Herring, is a 56 y.o. male  [id number]  [id number]  DOB - 14-Jul-1961  Subjective:  Chief Complaint and HPI: [name] Herring is a 56 y.o. male here today to establish care and for a follow up visit after being in the hospital 11/02/2016-11/07/2016. He was diagnosed with DVT/PE and placed on Eliquis.  PMH remarkable for HIV, paranoid schizophrenia, tobacco abuse, htn, and alcohol/substance abuse.  He is currently staying at the [facility] and says he has been clean and sober about 8 months.  Drug screens negative in the hospital 7 days ago and 3 months ago.  He has not gotten any of his medications filled since leaving the hospital, so he hasn't taken any of them.  He HAS been taking his HIV meds consistently and follows up with ID about every 6 months.  He denies any complaints today.  No SOB/CP  Difficult to obtain histiry  Today, he denies CP, SOB.  He is feeling good overall.  He has an appt with Dr Johnnye Sima for ID followup on 11/13/2016.  Brief hospital course/summary:  He presented to the ED via EMS with CP and SOB.  He was found to have DVT and PE and had an IVC filter placed.  He was given IV heparin and discharged on Eliquis. He was also started on Potassium and Magnesium replacement.  ED/Hospital notes reviewed.   Social History:  Living at a [facility]/sober living conditions  ROS:   Constitutional:  No f/c, No night sweats, No unexplained weight loss. EENT:  No vision changes, No blurry vision, No hearing changes. No mouth, throat, or ear problems.  Respiratory: No cough, No SOB Cardiac: No CP, no palpitations GI:  No abd pain, No N/V/D. GU: No Urinary s/sx Musculoskeletal: No joint pain Neuro: No headache, no dizziness, no motor weakness.  Skin: No rash Endocrine:  No polydipsia. No polyuria.  Psych: Denies SI/HI  No problems updated.  ALLERGIES: Allergies  Allergen Reactions  . Amoxicillin Shortness Of Breath and Rash    Has [name] had a PCN  reaction causing immediate rash, facial/tongue/throat swelling, [name] [name] [name] [name] If all of the above answers are "NO", then may proceed with Cephalosporin use.   . Latex Shortness Of Breath    PAST MEDICAL HISTORY: Past Medical History:  Diagnosis Date  . Depression   . HIV (human immunodeficiency virus infection) ([place])   . Hypertension   . Immune deficiency disorder ([place])   . Personality disorder   . Schizophrenia ([place])     MEDICATIONS AT HOME: Prior to Admission medications   Medication Sig Start Date End Date Taking? Authorizing Provider  amLODipine (NORVASC) 10 MG tablet Take 10 mg by mouth daily.   Yes Historical Provider, MD  benztropine (COGENTIN) 1 MG tablet Take 1 tablet (1 mg total) by mouth at bedtime. 03/30/16  Yes Benjamine Mola, FNP  budesonide-formoterol (SYMBICORT) 160-4.5 MCG/ACT inhaler Inhale 2 puffs into the lungs 2 (two) times daily. 11/07/16  Yes Eugenie Filler, MD  elvitegravir-cobicistat-emtricitabine-tenofovir (GENVOYA) 150-150-200-10 MG TABS tablet TAKE 1 TABLET BY MOUTH DAILY WITH BREAKFAST. Christena Flake [phone] to see Dr Johnnye Sima ASAP* 07/17/16  Yes Janece Canterbury, MD  gabapentin (NEURONTIN) 300 MG capsule Take 300 mg by mouth 2 (  two) times daily.   Yes Historical Provider, MD  haloperidol (HALDOL) 10 MG tablet Take 10 mg by mouth 2 (two) times daily.   Yes Historical Provider, MD  hydrochlorothiazide (HYDRODIURIL) 25 MG tablet Take 25 mg by mouth daily.   Yes Historical Provider, MD  magnesium oxide (MAG-OX) 400 (241.3 Mg) MG tablet Take 1 tablet (400 mg total) by mouth 2 (two) times daily. 11/07/16  Yes Eugenie Filler, MD  Multiple Vitamin (MULTIVITAMIN WITH MINERALS) TABS tablet  Take 1 tablet by mouth daily. 11/08/16  Yes Eugenie Filler, MD  nicotine (NICODERM CQ - DOSED IN MG/24 HOURS) 21 mg/24hr patch Place 1 patch (21 mg total) onto the skin daily. 11/08/16  Yes Eugenie Filler, MD  oxyCODONE-acetaminophen (PERCOCET/ROXICET) 5-325 MG tablet Take 1 tablet by mouth every 4 (four) hours as needed for moderate pain. 11/07/16  Yes Eugenie Filler, MD  potassium chloride SA (K-DUR,KLOR-CON) 20 MEQ tablet Take 1 tablet (20 mEq total) by mouth 3 (three) times daily. [id number]  Yes Delora Fuel, MD  thiamine [id number] MG tablet Take 1 tablet (100 mg total) by mouth daily. 11/08/16  Yes Eugenie Filler, MD  tiotropium (SPIRIVA) 18 MCG inhalation capsule Place 1 capsule (18 mcg total) into inhaler and inhale daily. 11/08/16  Yes Eugenie Filler, MD  Valbenazine Tosylate [facility]) 80 MG CAPS Take by mouth.   Yes Historical Provider, MD  acetaminophen (TYLENOL) 500 MG tablet Take 1 tablet (500 mg total) by mouth every 6 (six) hours as needed for moderate pain. [name] not taking: Reported on 11/09/2016 11/07/16   Eugenie Filler, MD  apixaban (ELIQUIS) 2.5 MG TABS tablet Take 1 tablet (2.5 mg total) by mouth 2 (two) times daily. [name] not taking: Reported on 11/09/2016 11/13/16   Eugenie Filler, MD  apixaban (ELIQUIS) 5 MG TABS tablet Take 1 tablet (5 mg total) by mouth 2 (two) times daily. Take for 6 days then start other prescription for 2.5 mg 2 times daily. [name] not taking: Reported on 11/09/2016 11/07/16 11/12/16  Eugenie Filler, MD  gabapentin (NEURONTIN) 600 MG tablet Take 600 mg by mouth at bedtime.    Historical Provider, MD  ibuprofen (ADVIL,MOTRIN) 200 MG tablet Take 400 mg by mouth every 6 (six) hours as needed.    Historical Provider, MD  mirtazapine (REMERON) 15 MG tablet Take 15 mg by mouth at bedtime.    Historical Provider, MD     Objective:  EXAM:   Vitals:   11/09/16 1407  BP: 117/65  Pulse: (!) 124  Resp: 16  Temp: 98.5 F (36.9 C)  TempSrc: Oral    SpO2: 96%  Weight: 138 lb 6.4 oz (62.8 kg)    General appearance : A&OX3. NAD. Non-toxic-appearing.  Very thin, almost cachectic.  Appears anxious HEENT: Atraumatic and Normocephalic.  PERRLA. EOM intact.  TM clear B. Mouth-MMM, post pharynx WNL w/o erythema, No PND. Neck: supple, no JVD. No cervical lymphadenopathy. No thyromegaly Chest/Lungs:  Breathing-non-labored, Fair air entry bilaterally, breath sounds normal without rales, rhonchi, or wheezing  CVS: S1 S2 regular(rate at 92bpm at time of exam), no murmurs, gallops, rubs  Extremities: Bilateral Lower Ext shows no edema, both legs are warm to touch with = pulse throughout Neurology:  CN II-XII grossly intact, Non focal.   Psych:  TP linear. J/I WNL. Normal speech. Appropriate eye contact and affect.  Skin:  No Rash  Data Review No results found for: HGBA1C   Assessment & Plan  1. Essential hypertension Controlled today.  Continue amlodipine  2. PE (pulmonary thromboembolism) ([place]) IVC filter placed Must get Eliquis!!!  The prescription is currently being filled at [facility] today.  It looks as though he still needs to take Eliquis 5mg  bid X 5 days then proceed to Eliquis 2.5mg  bid.    3. Acute deep vein thrombosis (DVT) of popliteal vein of both lower extremities (HCC) Must get Eliquis!!!  The prescription is currently being filled at [facility] today.  It looks as though he still needs to take Eliquis 5mg  bid X 5 days then proceed to Eliquis 2.5mg  bid.    4. Hypokalemia Continue potassium replacement - Basic metabolic panel  5. Human immunodeficiency virus (HIV) disease ([place]) Keep f/up with Dr Johnnye Sima 11/13/2016 - CBC with Differential/Platelet  6. Hypomagnesemia Continue magnesium replacement. - Magnesium  11/10/2016 9:00am:  spoke with [place] and collaborated on meds.  He told me the Cogentin should be 1mg  1tab in the morning and 2 tabs at night.  I adjusted this in the medical record.   [name] have  been counseled extensively about nutrition and exercise  Return in about 10 days (around 11/19/2016) for establish care;f/up DVT, HIV, ;lab abnormalities.  The [name] was given clear instructions to go to ER or return to medical center if symptoms don't improve, worsen or new problems develop. The [name] verbalized understanding. The [name] was told to call to get lab results if they haven't heard anything in the next week.     Freeman Caldron, PA-C [facility] and [place], [place]   11/09/2016, 3:00 PMPatient ID: ALESSANDRO Herring, male   DOB: [birth year], 56 y.o.   MRN: [id number]

## 2016-11-10 MED ORDER — BENZTROPINE MESYLATE 1 MG PO TABS: ORAL_TABLET | ORAL | 0 refills | Status: DC

## 2016-11-11 DIAGNOSIS — I82403 Acute embolism and thrombosis of unspecified deep veins of lower extremity, bilateral: Secondary | ICD-10-CM | POA: Diagnosis not present

## 2016-11-11 DIAGNOSIS — I2699 Other pulmonary embolism without acute cor pulmonale: Secondary | ICD-10-CM | POA: Diagnosis not present

## 2016-11-13 ENCOUNTER — Telehealth: Payer: Self-pay

## 2016-11-13 ENCOUNTER — Encounter: Payer: Self-pay | Admitting: Infectious Diseases

## 2016-11-13 ENCOUNTER — Ambulatory Visit (INDEPENDENT_AMBULATORY_CARE_PROVIDER_SITE_OTHER): Payer: Medicare Other | Admitting: Infectious Diseases

## 2016-11-13 VITALS — BP 151/92 | HR 105 | Temp 98.3°F | Wt 141.0 lb

## 2016-11-13 DIAGNOSIS — Z113 Encounter for screening for infections with a predominantly sexual mode of transmission: Secondary | ICD-10-CM | POA: Diagnosis not present

## 2016-11-13 DIAGNOSIS — Z79899 Other long term (current) drug therapy: Secondary | ICD-10-CM | POA: Diagnosis not present

## 2016-11-13 DIAGNOSIS — I2699 Other pulmonary embolism without acute cor pulmonale: Secondary | ICD-10-CM

## 2016-11-13 DIAGNOSIS — B20 Human immunodeficiency virus [HIV] disease: Secondary | ICD-10-CM | POA: Diagnosis not present

## 2016-11-13 DIAGNOSIS — F1424 Cocaine dependence with cocaine-induced mood disorder: Secondary | ICD-10-CM

## 2016-11-13 DIAGNOSIS — D012 Carcinoma in situ of rectum: Secondary | ICD-10-CM

## 2016-11-13 DIAGNOSIS — I10 Essential (primary) hypertension: Secondary | ICD-10-CM | POA: Diagnosis not present

## 2016-11-13 DIAGNOSIS — Z23 Encounter for immunization: Secondary | ICD-10-CM | POA: Diagnosis not present

## 2016-11-13 LAB — CBC
HEMATOCRIT: 34.6 % — AB (ref 38.5–50.0)
Hemoglobin: 11 g/dL — ABNORMAL LOW (ref 13.2–17.1)
MCH: 31.4 pg (ref 27.0–33.0)
MCHC: 31.8 g/dL — ABNORMAL LOW (ref 32.0–36.0)
MCV: 98.9 fL (ref 80.0–100.0)
MPV: 9.6 fL (ref 7.5–12.5)
PLATELETS: 248 10*3/uL (ref 140–400)
RBC: 3.5 MIL/uL — AB (ref 4.20–5.80)
RDW: 16.5 % — AB (ref 11.0–15.0)
WBC: 5.9 10*3/uL (ref 3.8–10.8)

## 2016-11-13 LAB — COMPREHENSIVE METABOLIC PANEL
ALK PHOS: 50 U/L (ref 40–115)
ALT: 14 U/L (ref 9–46)
AST: 24 U/L (ref 10–35)
Albumin: 3.9 g/dL (ref 3.6–5.1)
BUN: 14 mg/dL (ref 7–25)
CALCIUM: 9.6 mg/dL (ref 8.6–10.3)
CHLORIDE: 106 mmol/L (ref 98–110)
CO2: 25 mmol/L (ref 20–31)
Creat: 1.11 mg/dL (ref 0.70–1.33)
Glucose, Bld: 100 mg/dL — ABNORMAL HIGH (ref 65–99)
POTASSIUM: 4 mmol/L (ref 3.5–5.3)
Sodium: 139 mmol/L (ref 135–146)
TOTAL PROTEIN: 7.1 g/dL (ref 6.1–8.1)
Total Bilirubin: 0.4 mg/dL (ref 0.2–1.2)

## 2016-11-13 NOTE — Assessment & Plan Note (Signed)
He will continue to f/u with [facility].  Will check his CR and CBC today.

## 2016-11-13 NOTE — Progress Notes (Signed)
   Subjective:    Patient ID: [name] Herring, male    DOB: 02/10/1961, 56 y.o.   MRN: [id number]  HPI 56 yo M with hx of HIV+ (08-18-98), previously followed at [facility]. 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 Believes he had surgery for this? Was previously on KLT/CBV until 12-2014 when he was changed to genvoya.  He was also refered to surgery for his AIN,  He was hospitalized 2-8 to 2-13 with bilateral "submassive PE with evidence of R heart strain". He was also found to have LLE dvt. He had IVC placed and was started on eliquis.  He has been breathing well, HR feels fast. He has had no nose or gum bleeds. Getting eliquis free.  H/h stable. Cr 0.95 --> 1.15 (2-15)  HIV 1 RNA Quant (copies/mL)  Date Value  07/16/2016 <20  08/05/2015 <20  01/13/2015 CANCELED   CD4 T Cell Abs (/uL)  Date Value  07/16/2016 470  08/05/2015 300 (L)  01/13/2015 310 (L)   No problems with genvoya.   Review of Systems  Constitutional: Negative for appetite change, chills, fever and unexpected weight change.  Respiratory: Negative for cough and shortness of breath.   Cardiovascular: Positive for leg swelling.  Gastrointestinal: Negative for blood in stool, constipation and diarrhea.  Genitourinary: Negative for difficulty urinating and hematuria.  Neurological: Positive for headaches.  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  HENT:  Mouth/Throat: No oropharyngeal exudate.  Eyes: EOM are normal. Pupils are equal, round, and reactive to light.  Neck: Neck supple.  Cardiovascular: Normal rate, regular rhythm and normal heart sounds.   Pulmonary/Chest: Effort normal and breath sounds normal.  Abdominal: Soft. Bowel sounds are normal. There is no tenderness. There is no rebound.  Musculoskeletal: He exhibits no edema or tenderness.  Lymphadenopathy:    He has no cervical adenopathy.       Assessment & Plan:

## 2016-11-13 NOTE — Assessment & Plan Note (Signed)
will check UDS due to htn, hx of cocaine abuse.

## 2016-11-13 NOTE — Assessment & Plan Note (Signed)
[facility]  Greatly appreciate them partnering with [place].

## 2016-11-13 NOTE — Assessment & Plan Note (Signed)
He appears to be doing well Will recheck his labs today Has gotten flu shot Mening today rtc in 6 months.

## 2016-11-13 NOTE — Telephone Encounter (Signed)
Contacted pt to go over lab results pt didn't answer was unable to lvm 

## 2016-11-13 NOTE — Assessment & Plan Note (Signed)
Will try to get him back in at [facility]

## 2016-11-13 NOTE — Addendum Note (Signed)
Addended by: Reggy Eye on: 11/13/2016 04:28 PM   Modules accepted: Orders

## 2016-11-14 ENCOUNTER — Other Ambulatory Visit: Payer: Self-pay | Admitting: Infectious Diseases

## 2016-11-14 DIAGNOSIS — I2699 Other pulmonary embolism without acute cor pulmonale: Secondary | ICD-10-CM | POA: Diagnosis not present

## 2016-11-14 DIAGNOSIS — B20 Human immunodeficiency virus [HIV] disease: Secondary | ICD-10-CM

## 2016-11-14 DIAGNOSIS — I82403 Acute embolism and thrombosis of unspecified deep veins of lower extremity, bilateral: Secondary | ICD-10-CM | POA: Diagnosis not present

## 2016-11-14 LAB — T-HELPER CELL (CD4) - (RCID CLINIC ONLY)
CD4 % Helper T Cell: 21 % — ABNORMAL LOW (ref 33–55)
CD4 T CELL ABS: 290 /uL — AB (ref 400–2700)

## 2016-11-14 LAB — RPR

## 2016-11-15 DIAGNOSIS — I2699 Other pulmonary embolism without acute cor pulmonale: Secondary | ICD-10-CM | POA: Diagnosis not present

## 2016-11-15 DIAGNOSIS — I82403 Acute embolism and thrombosis of unspecified deep veins of lower extremity, bilateral: Secondary | ICD-10-CM | POA: Diagnosis not present

## 2016-11-16 ENCOUNTER — Encounter (HOSPITAL_COMMUNITY): Payer: Self-pay | Admitting: Emergency Medicine

## 2016-11-16 ENCOUNTER — Emergency Department (HOSPITAL_COMMUNITY)
Admission: EM | Admit: 2016-11-16 | Discharge: 2016-11-16 | Disposition: A | Discharge status: Home / Self Care | Payer: Medicare Other | Attending: Emergency Medicine | Admitting: Emergency Medicine

## 2016-11-16 ENCOUNTER — Emergency Department (HOSPITAL_COMMUNITY): Payer: Medicare Other

## 2016-11-16 DIAGNOSIS — Z91040 Latex allergy status: Secondary | ICD-10-CM | POA: Diagnosis not present

## 2016-11-16 DIAGNOSIS — E039 Hypothyroidism, unspecified: Secondary | ICD-10-CM | POA: Diagnosis not present

## 2016-11-16 DIAGNOSIS — R0789 Other chest pain: Secondary | ICD-10-CM | POA: Diagnosis not present

## 2016-11-16 DIAGNOSIS — Z79899 Other long term (current) drug therapy: Secondary | ICD-10-CM | POA: Insufficient documentation

## 2016-11-16 DIAGNOSIS — I82403 Acute embolism and thrombosis of unspecified deep veins of lower extremity, bilateral: Secondary | ICD-10-CM | POA: Diagnosis not present

## 2016-11-16 DIAGNOSIS — F17210 Nicotine dependence, cigarettes, uncomplicated: Secondary | ICD-10-CM | POA: Diagnosis not present

## 2016-11-16 DIAGNOSIS — R079 Chest pain, unspecified: Secondary | ICD-10-CM | POA: Diagnosis present

## 2016-11-16 DIAGNOSIS — I10 Essential (primary) hypertension: Secondary | ICD-10-CM | POA: Insufficient documentation

## 2016-11-16 DIAGNOSIS — I2699 Other pulmonary embolism without acute cor pulmonale: Secondary | ICD-10-CM | POA: Diagnosis not present

## 2016-11-16 HISTORY — DX: Other pulmonary embolism without acute cor pulmonale: I26.99

## 2016-11-16 LAB — CBC WITH DIFFERENTIAL/PLATELET
BASOS ABS: 0 10*3/uL (ref 0.0–0.1)
BASOS PCT: 0 %
EOS ABS: 0 10*3/uL (ref 0.0–0.7)
Eosinophils Relative: 1 %
HEMATOCRIT: 30.6 % — AB (ref 39.0–52.0)
HEMOGLOBIN: 9.7 g/dL — AB (ref 13.0–17.0)
Lymphocytes Relative: 34 %
Lymphs Abs: 2 10*3/uL (ref 0.7–4.0)
MCH: 31.5 pg (ref 26.0–34.0)
MCHC: 31.7 g/dL (ref 30.0–36.0)
MCV: 99.4 fL (ref 78.0–100.0)
Monocytes Absolute: 0.3 10*3/uL (ref 0.1–1.0)
Monocytes Relative: 5 %
NEUTROS ABS: 3.6 10*3/uL (ref 1.7–7.7)
NEUTROS PCT: 60 %
Platelets: 209 10*3/uL (ref 150–400)
RBC: 3.08 MIL/uL — AB (ref 4.22–5.81)
RDW: 16.5 % — ABNORMAL HIGH (ref 11.5–15.5)
WBC: 5.9 10*3/uL (ref 4.0–10.5)

## 2016-11-16 LAB — BASIC METABOLIC PANEL
ANION GAP: 10 (ref 5–15)
BUN: 13 mg/dL (ref 6–20)
CHLORIDE: 112 mmol/L — AB (ref 101–111)
CO2: 18 mmol/L — ABNORMAL LOW (ref 22–32)
CREATININE: 0.88 mg/dL (ref 0.61–1.24)
Calcium: 8.9 mg/dL (ref 8.9–10.3)
GFR calc non Af Amer: >60 mL/min (ref >60)
Glucose, Bld: 93 mg/dL (ref 65–99)
POTASSIUM: 4 mmol/L (ref 3.5–5.1)
SODIUM: 140 mmol/L (ref 135–145)

## 2016-11-16 LAB — I-STAT TROPONIN, ED
TROPONIN I, POC: 0 ng/mL (ref 0.00–0.08)
Troponin i, poc: 0 ng/mL (ref 0.00–0.08)

## 2016-11-16 LAB — HIV-1 RNA QUANT-NO REFLEX-BLD
HIV 1 RNA QUANT: 152 {copies}/mL — AB
HIV-1 RNA Quant, Log: 2.18 Log copies/mL — ABNORMAL HIGH

## 2016-11-16 MED ORDER — IOPAMIDOL (ISOVUE-370) INJECTION 76%
INTRAVENOUS | Status: AC
  Administered 2016-11-16: 100 mL
  Filled 2016-11-16: qty 100

## 2016-11-16 MED ORDER — ONDANSETRON HCL 4 MG/2ML IJ SOLN
4.0000 mg | Freq: Once | INTRAMUSCULAR | Status: AC
  Administered 2016-11-16: 4 mg via INTRAVENOUS
  Filled 2016-11-16: qty 2

## 2016-11-16 MED ORDER — MORPHINE SULFATE (PF) 4 MG/ML IV SOLN
4.0000 mg | Freq: Once | INTRAVENOUS | Status: AC
  Administered 2016-11-16: 4 mg via INTRAVENOUS
  Filled 2016-11-16: qty 1

## 2016-11-16 NOTE — ED Provider Notes (Signed)
[facility] DEPT Provider Note   CSN: [id number] Arrival date & time: 11/16/16  [id number] By signing my name below, I, [name] Herring, attest that this documentation has been prepared under the direction and in the presence of [name], MD . Electronically Signed: Dyke Herring, Scribe. 11/16/2016. 4:00 AM.   History   Chief Complaint Chief Complaint  Patient presents with  . Chest Pain   HPI [name] Herring is a 56 y.o. male with a history of HIV, HTN, schizophrenia, DVT and PE who presents to the Emergency Department complaining of sudden onset, constant chest pain onset one hour ago, waking him from his sleep. He describes this as 9/10, sharp, stabbing chest pain with radiation to his back and neck. Pt notes associated nausea and SOB. Per pt, he was recently diagnosed with PE and was started on blood thinners. He states he has been compliant with his medications and has not missed a dose. He denies any diaphoresis or vomiting. Pt has no other complaints or symptoms at this time.    The history is provided by the patient. No language interpreter was used.   Past Medical History:  Diagnosis Date  . Depression   . HIV (human immunodeficiency virus infection) ([place])   . Hypertension   . Immune deficiency disorder ([place])   . PE (pulmonary thromboembolism) ([place]) 2018  . Personality disorder   . Schizophrenia [facility])     Patient Active Problem List   Diagnosis Date Noted  . Acute deep vein thrombosis (DVT) of right lower extremity ([place])   . DVT of lower extremity, bilateral ([place]) 11/04/2016  . Tobacco abuse 11/02/2016  . PE (pulmonary thromboembolism) ([place]) 11/02/2016  . Acute respiratory failure with hypoxia ([place]) 07/17/2016  . CAP (community acquired pneumonia) 07/16/2016  . Cocaine dependence with cocaine-induced mood disorder ([place]) 03/29/2016  . HTN (hypertension) 08/25/2015  . Paranoid schizophrenia, chronic condition ([place]) 12/30/2014  . Cocaine use disorder, severe,  dependence ([place]) 12/27/2014  . Alcohol use disorder, severe, dependence ([place]) 12/27/2014  . ERECTILE DYSFUNCTION 09/03/2007  . FOOT PAIN, BILATERAL 07/10/2007  . Human immunodeficiency virus (HIV) disease ([place]) 11/09/2006  . VIRAL MENINGITIS 11/09/2006  . THRUSH 11/09/2006  . CA IN SITU, RECTUM 11/09/2006  . HYPOTHYROIDISM 11/09/2006  . DISORDER, BIPOLAR NOS 11/09/2006  . PYELONEPHRITIS 11/09/2006    Past Surgical History:  Procedure Laterality Date  . IR GENERIC HISTORICAL  11/04/2016   IR IVC FILTER PLMT / S&I [facility] GUID/MOD SED 11/04/2016 Aletta Edouard, MD [facility]-INTERV RAD     Home Medications    Prior to Admission medications   Medication Sig Start Date End Date Taking? Authorizing Provider  acetaminophen (TYLENOL) 500 MG tablet Take 1 tablet (500 mg total) by mouth every 6 (six) hours as needed for moderate pain. 11/07/16   Eugenie Filler, MD  amLODipine (NORVASC) 10 MG tablet Take 10 mg by mouth daily.    Historical Provider, MD  apixaban (ELIQUIS) 2.5 MG TABS tablet Take 1 tablet (2.5 mg total) by mouth 2 (two) times daily. 11/13/16   Eugenie Filler, MD  apixaban (ELIQUIS) 5 MG TABS tablet Take 1 tablet (5 mg total) by mouth 2 (two) times daily. Take for 6 days then start other prescription for 2.5 mg 2 times daily. Patient not taking: Reported on 11/09/2016 11/07/16 11/12/16  Eugenie Filler, MD  benztropine (COGENTIN) 1 MG tablet 1 tab in the morning 2 tabs at night 11/10/16   Argentina Donovan, PA-C  elvitegravir-cobicistat-emtricitabine-tenofovir (GENVOYA) 150-150-200-10  MG TABS tablet TAKE 1 TABLET BY MOUTH DAILY WITH BREAKFAST. Christena Flake [phone] to see Dr Johnnye Sima ASAP* 07/17/16   Janece Canterbury, MD  gabapentin (NEURONTIN) 300 MG capsule Take 300 mg by mouth 2 (two) times daily.    Historical Provider, MD  gabapentin (NEURONTIN) 600 MG tablet Take 600 mg by mouth at bedtime.    Historical Provider, MD  GENVOYA 150-150-200-10 MG TABS tablet TAKE ONE TABLET BY MOUTH DAILY  WITH BREAKFAST 11/14/16   Campbell Riches, MD  haloperidol (HALDOL) 10 MG tablet Take 10 mg by mouth 2 (two) times daily.    Historical Provider, MD  hydrochlorothiazide (HYDRODIURIL) 25 MG tablet Take 25 mg by mouth daily.    Historical Provider, MD  ibuprofen (ADVIL,MOTRIN) 200 MG tablet Take 400 mg by mouth every 6 (six) hours as needed.    Historical Provider, MD  magnesium oxide (MAG-OX) 400 (241.3 Mg) MG tablet Take 1 tablet (400 mg total) by mouth 2 (two) times daily. 11/07/16   Eugenie Filler, MD  mirtazapine (REMERON) 15 MG tablet Take 15 mg by mouth at bedtime.    Historical Provider, MD  Multiple Vitamin (MULTIVITAMIN WITH MINERALS) TABS tablet Take 1 tablet by mouth daily. 11/08/16   Eugenie Filler, MD  nicotine (NICODERM CQ - DOSED IN MG/24 HOURS) 21 mg/24hr patch Place 1 patch (21 mg total) onto the skin daily. 11/08/16   Eugenie Filler, MD  oxyCODONE-acetaminophen (PERCOCET/ROXICET) 5-325 MG tablet Take 1 tablet by mouth every 4 (four) hours as needed for moderate pain. 11/07/16   Eugenie Filler, MD  potassium chloride SA (K-DUR,KLOR-CON) 20 MEQ tablet Take 1 tablet (20 mEq total) by mouth 3 (three) times daily. [id number]   Delora Fuel, MD  thiamine 123XX123 MG tablet Take 1 tablet (100 mg total) by mouth daily. 11/08/16   Eugenie Filler, MD  tiotropium (SPIRIVA) 18 MCG inhalation capsule Place 1 capsule (18 mcg total) into inhaler and inhale daily. 11/08/16   Eugenie Filler, MD  Valbenazine Tosylate [facility]) 80 MG CAPS Take by mouth.    Historical Provider, MD    Family History Family History  Problem Relation Age of Onset  . CAD Mother   . Kidney disease Sister   . Lupus Brother   . Cancer Maternal Grandmother   . Stroke Paternal Grandmother     Social History Social History  Substance Use Topics  . Smoking status: Current Every Day Smoker    Packs/day: 1.00    Types: Cigarettes  . Smokeless tobacco: Never Used  . Alcohol use 0.0 oz/week     Comment: Daily.  Last used: unknown time and amount     Allergies   Amoxicillin and Latex   Review of Systems Review of Systems 10 systems reviewed and all are negative for acute change except as noted in the HPI.  Physical Exam Updated Vital Signs BP 129/87   Pulse 70   Temp 97.6 F (36.4 C) (Oral)   Resp 20   Ht 5\' 10"  (1.778 m)   Wt 141 lb (64 kg)   SpO2 96%   BMI 20.23 kg/m   Physical Exam  Constitutional: He is oriented to person, place, and time. He appears well-developed and well-nourished. No distress.  HENT:  Head: Normocephalic and atraumatic.  Right Ear: Hearing normal.  Left Ear: Hearing normal.  Nose: Nose normal.  Mouth/Throat: Oropharynx is clear and moist and mucous membranes are normal.  Eyes: Conjunctivae and EOM are normal. Pupils are equal,  round, and reactive to light.  Neck: Normal range of motion. Neck supple.  Cardiovascular: Regular rhythm, S1 normal and S2 normal.  Exam reveals no gallop and no friction rub.   No murmur heard. Pulmonary/Chest: Effort normal and breath sounds normal. No respiratory distress. He exhibits no tenderness.  Abdominal: Soft. Normal appearance and bowel sounds are normal. There is no hepatosplenomegaly. There is no tenderness. There is no rebound, no guarding, no tenderness at McBurney's point and negative Murphy's sign. No hernia.  Musculoskeletal: Normal range of motion.  Neurological: He is alert and oriented to person, place, and time. He has normal strength. No cranial nerve deficit or sensory deficit. Coordination normal. GCS eye subscore is 4. GCS verbal subscore is 5. GCS motor subscore is 6.  Skin: Skin is warm, dry and intact. No rash noted. No cyanosis.  Psychiatric: He has a normal mood and affect. His speech is normal and behavior is normal. Thought content normal.  Nursing note and vitals reviewed.  ED Treatments / Results  DIAGNOSTIC STUDIES:  Oxygen Saturation is 95% on RA, adequate by my interpretation.     COORDINATION OF CARE:  6:43 AM Discussed treatment plan with pt at bedside and pt agreed to plan.   Labs (all labs ordered are listed, but only abnormal results are displayed) Labs Reviewed  CBC WITH DIFFERENTIAL/PLATELET - Abnormal; Notable for the following:       Result Value   RBC 3.08 (*)    Hemoglobin 9.7 (*)    HCT 30.6 (*)    RDW 16.5 (*)    All other components within normal limits  BASIC METABOLIC PANEL - Abnormal; Notable for the following:    Chloride 112 (*)    CO2 18 (*)    All other components within normal limits  I-STAT TROPOININ, ED    EKG  EKG Interpretation  Date/Time:  Thursday November 16 2016 03:46:45 EST Ventricular Rate:  76 PR Interval:    QRS Duration: 93 QT Interval:  405 QTC Calculation: 456 R Axis:   16 Text Interpretation:  Sinus rhythm Atrial premature complex ST elevation, consider anterior injury No significant change since last tracing Confirmed by POLLINA  MD, CHRISTOPHER [phone]) on 11/16/2016 3:55:59 AM       Radiology Ct Angio Chest Pe W Or Wo Contrast  Result Date: 11/16/2016 CLINICAL DATA:  Chest pain and tightness. Recent pulmonary embolism. EXAM: CT ANGIOGRAPHY CHEST WITH CONTRAST TECHNIQUE: Multidetector CT imaging of the chest was performed using the standard protocol during bolus administration of intravenous contrast. Multiplanar CT image reconstructions and MIPs were obtained to evaluate the vascular anatomy. CONTRAST:  100 mL Isovue 370 IV COMPARISON:  CTA chest 11/02/2016 FINDINGS: Cardiovascular: Contrast injection is sufficient to demonstrate satisfactory opacification of the pulmonary arteries to the segmental level. The previously seen extensive bilateral pulmonary emboli have largely resolved. There is a small amount of persistent embolus along the medial wall of the right lower lobe posterior basal segmental artery. On the left, there is a small amount of residual embolus within the left lower lobe anterior and medial  basal segmental arteries. The main pulmonary artery is mildly enlarged, measuring 3.1 cm at the bifurcation. There is no CT evidence of acute right heart strain. There is mild aortic atherosclerotic calcification. There is a normal 3-vessel arch branching pattern. Heart size is mildly enlarged, without pericardial effusion. There are coronary artery calcifications. Mediastinum/Nodes: No mediastinal, hilar or axillary lymphadenopathy. The visualized thyroid and thoracic esophageal course are unremarkable. Lungs/Pleura:  Bilateral severe emphysema. No pleural effusion or pneumothorax. No pulmonary nodules or masses. Upper Abdomen: Contrast bolus timing is not optimized for evaluation of the abdominal organs. Within this limitation, the visualized organs of the upper abdomen are normal. Musculoskeletal: No chest wall abnormality. No acute or significant osseous findings. Review of the MIP images confirms the above findings. IMPRESSION: 1. Markedly decreased pulmonary embolic burden. Small amount of residual embolus within bilateral basal segmental arteries. 2. Mildly enlarged main pulmonary artery, as may be seen in setting of pulmonary hypertension. 3. Severe emphysema. Electronically Signed   By: Ulyses Jarred M.D.   On: 11/16/2016 06:00    Procedures Procedures (including critical care time)  Medications Ordered in ED Medications  morphine 4 MG/ML injection 4 mg (4 mg Intravenous Given 11/16/16 0506)  ondansetron (ZOFRAN) injection 4 mg (4 mg Intravenous Given 11/16/16 0501)  iopamidol (ISOVUE-370) 76 % injection (100 mLs  Contrast Given 11/16/16 0525)     Initial Impression / Assessment and Plan / ED Course  I have reviewed the triage vital signs and the nursing notes.  Pertinent labs & imaging results that were available during my care of the patient were reviewed by me and considered in my medical decision making (see chart for details).     Patient presents with complaints of chest pain. Patient  reports sharp and stabbing pain in the center of his chest that goes into his back. Patient was recently diagnosed with PE. He is on pelvic rest. He reports that he has not missed any doses. No known coronary artery disease. EKG shows no evidence of ischemia or infarct. Troponin negative. Based on recent diagnosis of PE, CT scan performed to rule out worsening clot burden. Patient has significant improvement of his clot burden. Second troponin negative. Patient appropriate for discharge, treatment with analgesia and follow-up with primary care.  Final Clinical Impressions(s) / ED Diagnoses   Final diagnoses:  Chest pain, unspecified type    New Prescriptions New Prescriptions   No medications on file  I personally performed the services described in this documentation, which was scribed in my presence. The recorded information has been reviewed and is accurate.    [name], MD 11/16/16 [phone]

## 2016-11-16 NOTE — ED Notes (Signed)
Patient transported to CT 

## 2016-11-16 NOTE — ED Notes (Signed)
Pt states he understands instructions. Home stable with steady gait. 

## 2016-11-16 NOTE — ED Triage Notes (Signed)
BIB EMS from home with 9/10 chest pain/tightness that radiates to the neck and back with nausea and SOB.  Pain woke patient from sleep at 0245.  Pt has recent dx of pulmonary embolism.  Got 324 mg ASA, 4 mg zofran, and 1 nitro with EMS.  VSS.  NSR with [facility] on 12-lead.

## 2016-11-17 ENCOUNTER — Telehealth: Payer: Self-pay | Admitting: General Practice

## 2016-11-17 DIAGNOSIS — I82403 Acute embolism and thrombosis of unspecified deep veins of lower extremity, bilateral: Secondary | ICD-10-CM | POA: Diagnosis not present

## 2016-11-17 DIAGNOSIS — I2699 Other pulmonary embolism without acute cor pulmonale: Secondary | ICD-10-CM | POA: Diagnosis not present

## 2016-11-17 NOTE — Telephone Encounter (Signed)
[name] Herring with [facility] called at 0920 to report  [name] blood pressure was 166/108. He is asymptomatic. [name] had not taken his BP medication this morning prior to the visit. Pt compliant to take BP medication prior to nurse leaving his home.

## 2016-11-17 NOTE — Telephone Encounter (Signed)
Writer called Mr. [name] Herring to f/u with him after home health visit this morning. He stated he took his blood pressure medication. He does not have a blood pressure machine but feels fine.  Reminded of OV on Monday. Pt verbalized understanding.

## 2016-11-17 NOTE — Telephone Encounter (Signed)
Patient has a reading of 166/108 Bp. While member of [facility] is on hold an appointment to est. Care has been scheduled for patient for 2/26 @ 9:45 am

## 2016-11-20 ENCOUNTER — Ambulatory Visit: Payer: Medicare Other | Attending: Family Medicine | Admitting: Family Medicine

## 2016-11-20 ENCOUNTER — Encounter: Payer: Self-pay | Admitting: Family Medicine

## 2016-11-20 VITALS — BP 141/82 | HR 112 | Temp 97.6°F | Ht 70.0 in | Wt 137.0 lb

## 2016-11-20 DIAGNOSIS — Z888 Allergy status to other drugs, medicaments and biological substances status: Secondary | ICD-10-CM | POA: Diagnosis not present

## 2016-11-20 DIAGNOSIS — I82493 Acute embolism and thrombosis of other specified deep vein of lower extremity, bilateral: Secondary | ICD-10-CM | POA: Diagnosis not present

## 2016-11-20 DIAGNOSIS — F329 Major depressive disorder, single episode, unspecified: Secondary | ICD-10-CM | POA: Diagnosis not present

## 2016-11-20 DIAGNOSIS — Z95828 Presence of other vascular implants and grafts: Secondary | ICD-10-CM | POA: Diagnosis not present

## 2016-11-20 DIAGNOSIS — F200 Paranoid schizophrenia: Secondary | ICD-10-CM | POA: Insufficient documentation

## 2016-11-20 DIAGNOSIS — Z86718 Personal history of other venous thrombosis and embolism: Secondary | ICD-10-CM | POA: Insufficient documentation

## 2016-11-20 DIAGNOSIS — J449 Chronic obstructive pulmonary disease, unspecified: Secondary | ICD-10-CM | POA: Insufficient documentation

## 2016-11-20 DIAGNOSIS — Z79899 Other long term (current) drug therapy: Secondary | ICD-10-CM | POA: Diagnosis not present

## 2016-11-20 DIAGNOSIS — J438 Other emphysema: Secondary | ICD-10-CM

## 2016-11-20 DIAGNOSIS — I10 Essential (primary) hypertension: Secondary | ICD-10-CM | POA: Diagnosis not present

## 2016-11-20 DIAGNOSIS — Z7901 Long term (current) use of anticoagulants: Secondary | ICD-10-CM | POA: Diagnosis not present

## 2016-11-20 DIAGNOSIS — Z880 Allergy status to penicillin: Secondary | ICD-10-CM | POA: Diagnosis not present

## 2016-11-20 DIAGNOSIS — R000 Tachycardia, unspecified: Secondary | ICD-10-CM | POA: Diagnosis not present

## 2016-11-20 DIAGNOSIS — I2699 Other pulmonary embolism without acute cor pulmonale: Secondary | ICD-10-CM

## 2016-11-20 DIAGNOSIS — R079 Chest pain, unspecified: Secondary | ICD-10-CM | POA: Insufficient documentation

## 2016-11-20 DIAGNOSIS — J439 Emphysema, unspecified: Secondary | ICD-10-CM | POA: Insufficient documentation

## 2016-11-20 DIAGNOSIS — I82403 Acute embolism and thrombosis of unspecified deep veins of lower extremity, bilateral: Secondary | ICD-10-CM | POA: Diagnosis not present

## 2016-11-20 DIAGNOSIS — B20 Human immunodeficiency virus [HIV] disease: Secondary | ICD-10-CM | POA: Diagnosis not present

## 2016-11-20 DIAGNOSIS — Z86711 Personal history of pulmonary embolism: Secondary | ICD-10-CM | POA: Insufficient documentation

## 2016-11-20 MED ORDER — AMLODIPINE BESYLATE 10 MG PO TABS: 10.0000 mg | ORAL_TABLET | Freq: Every day | ORAL | 3 refills | Status: DC

## 2016-11-20 MED ORDER — METOPROLOL TARTRATE 50 MG PO TABS: 50.0000 mg | ORAL_TABLET | Freq: Two times a day (BID) | ORAL | 3 refills | Status: DC

## 2016-11-20 MED FILL — AMLODIPINE BESYLATE 10 MG T: 10 | 30 days supply | Qty: 30 | Fill #0

## 2016-11-20 MED FILL — METOPROLOL TARTRATE 50 MG T: 50 | 30 days supply | Qty: 60 | Fill #0

## 2016-11-20 NOTE — Progress Notes (Signed)
Subjective:  Patient ID: [name] Herring, male    DOB: Feb 01, 1961  Age: 56 y.o. MRN: [id number]  CC: Hypertension; DVT (lungs and right leg); and HIV Positive/AIDS   HPI [name] Herring presents Is a 56 year old male with a history of hypertension, HIV (CD4 count of 470, viral load of less than 20 currently on antiretroviral therapy) who presents to establish care at this clinic after most recent hospitalization (2/8 - 11/07/16 at [facility]) for bilateral submassive pulmonary embolism, acute DVT of branch of the R Gastrocnemius vein, left lower extremity acute mobile thrombus of the proximal popliteal vein,acute DVT of the  distal to mid posterior tibial vein with right heart strain.  During his hospitalization he underwent placement of IVC filter on 11/04/16 after which he was maintained on heparin which was later transitioned to Eliquis. 2-D echo revealed EF of 50-55%, no regional wall motion abnormalities, grade 1 diastolic dysfunction. His electrolytes were repleted and he was subsequently discharged with home health aide.  After discharge he presented to the ED 4 days ago with chest pain and CT chest revealed markedly reduced pulmonary embolism burden; has been to see his infectious disease specialist-Dr. Johnnye Sima. He denies any acute concerns at this time and does help for his medications. He is not currently receiving mental health care but informs me he previously did at [facility].  Past Medical History:  Diagnosis Date  . Depression   . HIV (human immunodeficiency virus infection) ([place])   . Hypertension   . Immune deficiency disorder ([place])   . PE (pulmonary thromboembolism) ([place]) 2018  . Personality disorder   . Schizophrenia [facility])     Past Surgical History:  Procedure Laterality Date  . IR GENERIC HISTORICAL  11/04/2016   IR IVC FILTER PLMT / S&I Burke Keels GUID/MOD SED 11/04/2016 Aletta Edouard, MD [facility]-INTERV RAD    Allergies  Allergen Reactions  . Amoxicillin Shortness Of  Breath and Rash    Has patient had a PCN reaction causing immediate rash, facial/tongue/throat swelling, [name] Has patient had a PCN reaction causing severe rash involving mucus membranes or skin necrosis: no Has patient had a PCN reaction that required hospitalization: yes drs office visit Has patient had a PCN reaction occurring within the last 10 years: no If all of the above answers are "NO", then may proceed with Cephalosporin use.   . Latex Shortness Of Breath      Outpatient Medications Prior to Visit  Medication Sig Dispense Refill  . acetaminophen (TYLENOL) 500 MG tablet Take 1 tablet (500 mg total) by mouth every 6 (six) hours as needed for moderate pain. 30 tablet 0  . apixaban (ELIQUIS) 2.5 MG TABS tablet Take 1 tablet (2.5 mg total) by mouth 2 (two) times daily. 60 tablet 3  . benztropine (COGENTIN) 1 MG tablet 1 tab in the morning 2 tabs at night 14 tablet 0  . elvitegravir-cobicistat-emtricitabine-tenofovir (GENVOYA) 150-150-200-10 MG TABS tablet TAKE 1 TABLET BY MOUTH DAILY WITH BREAKFAST. *CALL [phone] to see Dr Johnnye Sima ASAP* 30 tablet 1  . gabapentin (NEURONTIN) 600 MG tablet Take 600 mg by mouth at bedtime.    . haloperidol (HALDOL) 10 MG tablet Take 10 mg by mouth 2 (two) times daily.    Marland Kitchen ibuprofen (ADVIL,MOTRIN) 200 MG tablet Take 400 mg by mouth every 6 (six) hours as needed.    . magnesium oxide (MAG-OX) 400 (241.3 Mg) MG tablet Take 1 tablet (400 mg total) by mouth 2 (two)  times daily. 60 tablet 0  . mirtazapine (REMERON) 15 MG tablet Take 15 mg by mouth at bedtime.    . Multiple Vitamin (MULTIVITAMIN WITH MINERALS) TABS tablet Take 1 tablet by mouth daily.    . potassium chloride SA (K-DUR,KLOR-CON) 20 MEQ tablet Take 1 tablet (20 mEq total) by mouth 3 (three) times daily. 20 tablet 0  . thiamine 100 MG tablet Take 1 tablet (100 mg total) by mouth daily.    Marland Kitchen tiotropium (SPIRIVA) 18 MCG inhalation capsule Place 1 capsule  (18 mcg total) into inhaler and inhale daily. 30 capsule 6  . Valbenazine Tosylate (INGREZZA) 80 MG CAPS Take by mouth.    Marland Kitchen amLODipine (NORVASC) 10 MG tablet Take 10 mg by mouth daily.    . hydrochlorothiazide (HYDRODIURIL) 25 MG tablet Take 25 mg by mouth daily.    . nicotine (NICODERM CQ - DOSED IN MG/24 HOURS) 21 mg/24hr patch Place 1 patch (21 mg total) onto the skin daily. (Patient not taking: Reported on 11/20/2016) 28 patch 0  . oxyCODONE-acetaminophen (PERCOCET/ROXICET) 5-325 MG tablet Take 1 tablet by mouth every 4 (four) hours as needed for moderate pain. (Patient not taking: Reported on 11/20/2016) 15 tablet 0  . apixaban (ELIQUIS) 5 MG TABS tablet Take 1 tablet (5 mg total) by mouth 2 (two) times daily. Take for 6 days then start other prescription for 2.5 mg 2 times daily. (Patient not taking: Reported on 11/09/2016) 10 tablet 0  . gabapentin (NEURONTIN) 300 MG capsule Take 300 mg by mouth 2 (two) times daily.    . GENVOYA 150-150-200-10 MG TABS tablet TAKE ONE TABLET BY MOUTH DAILY WITH BREAKFAST 30 tablet 6   No facility-administered medications prior to visit.     ROS Review of Systems  Constitutional: Negative for activity change and appetite change.  HENT: Negative for sinus pressure and sore throat.   Eyes: Negative for visual disturbance.  Respiratory: Negative for cough, chest tightness and shortness of breath.   Cardiovascular: Negative for chest pain and leg swelling.  Gastrointestinal: Negative for abdominal distention, abdominal pain, constipation and diarrhea.  Endocrine: Negative.   Genitourinary: Negative for dysuria.  Musculoskeletal: Negative for joint swelling and myalgias.  Skin: Negative for rash.  Allergic/Immunologic: Negative.   Neurological: Negative for weakness, light-headedness and numbness.  Psychiatric/Behavioral: Negative for dysphoric mood and suicidal ideas.    Objective:  BP (!) 141/82 (BP Location: Right Arm, Patient Position: Sitting, Cuff  Size: Small)   Pulse (!) 112   Temp 97.6 F (36.4 C) (Oral)   Ht 5\' 10"  (1.778 m)   Wt 137 lb (62.1 kg)   SpO2 97%   BMI 19.66 kg/m   BP/Weight 11/20/2016 11/16/2016 0000000  Systolic BP [id number]  Diastolic BP 82 78 92  Wt. (Lbs) 137 141 141  BMI 19.66 20.23 20.23  Some encounter information is confidential and restricted. Go to Review Flowsheets activity to see all data.      Physical Exam  Constitutional: He is oriented to person, place, and time. He appears well-developed and well-nourished.  Cardiovascular: Normal heart sounds and intact distal pulses.  Tachycardia present.   No murmur heard. Pulmonary/Chest: Effort normal and breath sounds normal. He has no wheezes. He has no rales. He exhibits no tenderness.  Abdominal: Soft. Bowel sounds are normal. He exhibits no distension and no mass. There is no tenderness.  Musculoskeletal: Normal range of motion.  Neurological: He is alert and oriented to person, place, and time.  Skin: Skin  is warm and dry.  Psychiatric: He has a normal mood and affect.    CLINICAL DATA:  Chest pain and tightness. Recent pulmonary embolism.  EXAM: CT ANGIOGRAPHY CHEST WITH CONTRAST  TECHNIQUE: Multidetector CT imaging of the chest was performed using the standard protocol during bolus administration of intravenous contrast. Multiplanar CT image reconstructions and MIPs were obtained to evaluate the vascular anatomy.  CONTRAST:  100 mL Isovue 370 IV  COMPARISON:  CTA chest 11/02/2016  FINDINGS: Cardiovascular: Contrast injection is sufficient to demonstrate satisfactory opacification of the pulmonary arteries to the segmental level. The previously seen extensive bilateral pulmonary emboli have largely resolved. There is a small amount of persistent embolus along the medial wall of the right lower lobe posterior basal segmental artery. On the left, there is a small amount of residual embolus within the left lower lobe anterior  and medial basal segmental arteries. The main pulmonary artery is mildly enlarged, measuring 3.1 cm at the bifurcation. There is no CT evidence of acute right heart strain. There is mild aortic atherosclerotic calcification. There is a normal 3-vessel arch branching pattern. Heart size is mildly enlarged, without pericardial effusion. There are coronary artery calcifications.  Mediastinum/Nodes: No mediastinal, hilar or axillary lymphadenopathy. The visualized thyroid and thoracic esophageal course are unremarkable.  Lungs/Pleura: Bilateral severe emphysema. No pleural effusion or pneumothorax. No pulmonary nodules or masses.  Upper Abdomen: Contrast bolus timing is not optimized for evaluation of the abdominal organs. Within this limitation, the visualized organs of the upper abdomen are normal.  Musculoskeletal: No chest wall abnormality. No acute or significant osseous findings.  Review of the MIP images confirms the above findings.  IMPRESSION: 1. Markedly decreased pulmonary embolic burden. Small amount of residual embolus within bilateral basal segmental arteries. 2. Mildly enlarged main pulmonary artery, as may be seen in setting of pulmonary hypertension. 3. Severe emphysema.   Electronically Signed   By: Ulyses Jarred M.D.   On: 11/16/2016 06:00   Assessment & Plan:   1. Essential hypertension Slightly above goal of less than 130/80 Substituted hydrochlorothiazide with metoprolol due to tachycardia Low-sodium diet - metoprolol (LOPRESSOR) 50 MG tablet; Take 1 tablet (50 mg total) by mouth 2 (two) times daily.  Dispense: 60 tablet; Refill: 3 - amLODipine (NORVASC) 10 MG tablet; Take 1 tablet (10 mg total) by mouth daily.  Dispense: 30 tablet; Refill: 3  2. Acute deep vein thrombosis (DVT) of other specified vein of left lower extremity (HCC)/ B/L DVT Asymptomatic Currently on anticoagulation with Eliquis  3. PE (pulmonary thromboembolism) (HCC) S/p  IVC filter Asymptomatic Reduced PE burden from most recent CT Hypercoagulable panel in 04/2016 after 6 months of anticoagulation  4. Paranoid schizophrenia, chronic condition ([place]) Previously followed by mental health at [facility] Strongly encouraged to resume care with mental health  5. Other emphysema ([place]) Stable Continue Spiriva Strongly encouraged to quit smoking   Meds ordered this encounter  Medications  . metoprolol (LOPRESSOR) 50 MG tablet    Sig: Take 1 tablet (50 mg total) by mouth 2 (two) times daily.    Dispense:  60 tablet    Refill:  3  . amLODipine (NORVASC) 10 MG tablet    Sig: Take 1 tablet (10 mg total) by mouth daily.    Dispense:  30 tablet    Refill:  3    Follow-up: Return in about 1 month (around 12/18/2016) for follow up on Hypertension.   Arnoldo Morale MD

## 2016-11-22 ENCOUNTER — Telehealth: Payer: Self-pay | Admitting: Family Medicine

## 2016-11-22 DIAGNOSIS — I82403 Acute embolism and thrombosis of unspecified deep veins of lower extremity, bilateral: Secondary | ICD-10-CM | POA: Diagnosis not present

## 2016-11-22 DIAGNOSIS — I2699 Other pulmonary embolism without acute cor pulmonale: Secondary | ICD-10-CM | POA: Diagnosis not present

## 2016-11-22 NOTE — Telephone Encounter (Signed)
Denyse Amass nurse from [place] called requesting to know if pt. Still needs to be taking potassium chloride SA (K-DUR,KLOR-CON) 20 MEQ tablet Please f/u

## 2016-11-23 NOTE — Telephone Encounter (Signed)
Writer called Denyse Amass the nurse back from [place] who is requesting that a new prescription be sent for patient's potassium chloride to [facility] on [address].

## 2016-11-24 MED ORDER — POTASSIUM CHLORIDE CRYS ER 20 MEQ PO TBCR: 20.0000 meq | EXTENDED_RELEASE_TABLET | Freq: Three times a day (TID) | ORAL | 0 refills | Status: DC

## 2016-11-24 NOTE — Telephone Encounter (Signed)
Done

## 2016-11-24 NOTE — Addendum Note (Signed)
Addended by: Arnoldo Morale on: 11/24/2016 08:07 AM   Modules accepted: Orders

## 2016-11-25 ENCOUNTER — Encounter (HOSPITAL_COMMUNITY): Payer: Self-pay | Admitting: *Deleted

## 2016-11-25 ENCOUNTER — Emergency Department (HOSPITAL_COMMUNITY)
Admission: EM | Admit: 2016-11-25 | Discharge: 2016-11-26 | Disposition: A | Discharge status: Home / Self Care | Payer: Medicare Other | Attending: Emergency Medicine | Admitting: Emergency Medicine

## 2016-11-25 ENCOUNTER — Emergency Department (HOSPITAL_COMMUNITY): Payer: Medicare Other

## 2016-11-25 DIAGNOSIS — I2699 Other pulmonary embolism without acute cor pulmonale: Secondary | ICD-10-CM | POA: Diagnosis not present

## 2016-11-25 DIAGNOSIS — Z7901 Long term (current) use of anticoagulants: Secondary | ICD-10-CM | POA: Diagnosis not present

## 2016-11-25 DIAGNOSIS — Z91040 Latex allergy status: Secondary | ICD-10-CM | POA: Diagnosis not present

## 2016-11-25 DIAGNOSIS — R0602 Shortness of breath: Secondary | ICD-10-CM | POA: Insufficient documentation

## 2016-11-25 DIAGNOSIS — E039 Hypothyroidism, unspecified: Secondary | ICD-10-CM | POA: Insufficient documentation

## 2016-11-25 DIAGNOSIS — F17210 Nicotine dependence, cigarettes, uncomplicated: Secondary | ICD-10-CM | POA: Insufficient documentation

## 2016-11-25 DIAGNOSIS — R079 Chest pain, unspecified: Secondary | ICD-10-CM | POA: Insufficient documentation

## 2016-11-25 DIAGNOSIS — Z85048 Personal history of other malignant neoplasm of rectum, rectosigmoid junction, and anus: Secondary | ICD-10-CM | POA: Diagnosis not present

## 2016-11-25 DIAGNOSIS — R0682 Tachypnea, not elsewhere classified: Secondary | ICD-10-CM | POA: Diagnosis not present

## 2016-11-25 DIAGNOSIS — Z79899 Other long term (current) drug therapy: Secondary | ICD-10-CM | POA: Insufficient documentation

## 2016-11-25 DIAGNOSIS — I82403 Acute embolism and thrombosis of unspecified deep veins of lower extremity, bilateral: Secondary | ICD-10-CM | POA: Diagnosis not present

## 2016-11-25 DIAGNOSIS — I10 Essential (primary) hypertension: Secondary | ICD-10-CM | POA: Diagnosis not present

## 2016-11-25 LAB — BASIC METABOLIC PANEL
Anion gap: 15 (ref 5–15)
BUN: 22 mg/dL — AB (ref 6–20)
CHLORIDE: 105 mmol/L (ref 101–111)
CO2: 19 mmol/L — ABNORMAL LOW (ref 22–32)
Calcium: 9.5 mg/dL (ref 8.9–10.3)
Creatinine, Ser: 0.91 mg/dL (ref 0.61–1.24)
GFR calc Af Amer: >60 mL/min (ref >60)
GFR calc non Af Amer: >60 mL/min (ref >60)
GLUCOSE: 88 mg/dL (ref 65–99)
POTASSIUM: 3.3 mmol/L — AB (ref 3.5–5.1)
Sodium: 139 mmol/L (ref 135–145)

## 2016-11-25 LAB — CBC
HEMATOCRIT: 31.9 % — AB (ref 39.0–52.0)
HEMOGLOBIN: 10.4 g/dL — AB (ref 13.0–17.0)
MCH: 31.4 pg (ref 26.0–34.0)
MCHC: 32.6 g/dL (ref 30.0–36.0)
MCV: 96.4 fL (ref 78.0–100.0)
Platelets: 184 10*3/uL (ref 150–400)
RBC: 3.31 MIL/uL — ABNORMAL LOW (ref 4.22–5.81)
RDW: 16.1 % — AB (ref 11.5–15.5)
WBC: 5.3 10*3/uL (ref 4.0–10.5)

## 2016-11-25 LAB — I-STAT TROPONIN, ED: Troponin i, poc: 0 ng/mL (ref 0.00–0.08)

## 2016-11-25 LAB — BRAIN NATRIURETIC PEPTIDE: B Natriuretic Peptide: 46.7 pg/mL (ref 0.0–100.0)

## 2016-11-25 MED ORDER — POTASSIUM CHLORIDE CRYS ER 20 MEQ PO TBCR
40.0000 meq | EXTENDED_RELEASE_TABLET | Freq: Once | ORAL | Status: AC
  Administered 2016-11-25: 40 meq via ORAL
  Filled 2016-11-25: qty 2

## 2016-11-25 NOTE — ED Triage Notes (Signed)
Pt to ED by [facility] from home c/o sob x 3 days. Lung sounds clear. Pt diagnosed with DVT in LLE 4 weeks ago, placed on Eliquis . Initial sats 91% at home, EMS placed pt on 4L oxygen [place]. Pt reports chest pain onset yesterday.

## 2016-11-25 NOTE — ED Provider Notes (Signed)
[place] [facility] DEPT Provider Note   CSN: [id number] Arrival date & time: 11/25/16  2054     History   Chief Complaint Chief Complaint  Patient presents with  . Shortness of Breath    HPI [name] Herring is a 56 y.o. male.  HPI   Pt to ED by [facility] from home c/o sob x 3 days. Lung sounds clear. Pt diagnosed with DVT in LLE 4 weeks ago, placed on Eliquis . Initial sats 91% at home, EMS placed pt on 4L oxygen [place]. Pt reports chest pain onset yesterday.   Arrives due to SOB and CP that worsened over last 24h States cough for a few days Recently dx with PE on elequis, no missed doses CD4 in 10/2016 290; on HAART therapy States pain is sharp, on left side of chest Pain is severe, present at rest - has known chronic pain dz No radiation of pain No cocaine use No hx ACS  Past Medical History:  Diagnosis Date  . Depression   . HIV (human immunodeficiency virus infection) ([place])   . Hypertension   . Immune deficiency disorder ([place])   . PE (pulmonary thromboembolism) ([place]) 2018  . Personality disorder   . Schizophrenia [facility])     Patient Active Problem List   Diagnosis Date Noted  . Emphysema lung ([place]) 11/20/2016  . Deep vein thrombosis (DVT) of left lower extremity ([place])   . DVT of lower extremity, bilateral ([place]) 11/04/2016  . Tobacco abuse 11/02/2016  . PE (pulmonary thromboembolism) ([place]) 11/02/2016  . Acute respiratory failure with hypoxia ([place]) 07/17/2016  . CAP (community acquired pneumonia) 07/16/2016  . Cocaine dependence with cocaine-induced mood disorder ([place]) 03/29/2016  . HTN (hypertension) 08/25/2015  . Paranoid schizophrenia, chronic condition ([place]) 12/30/2014  . Cocaine use disorder, severe, dependence ([place]) 12/27/2014  . Alcohol use disorder, severe, dependence ([place]) 12/27/2014  . ERECTILE DYSFUNCTION 09/03/2007  . FOOT PAIN, BILATERAL 07/10/2007  . Human immunodeficiency virus (HIV) disease ([place]) 11/09/2006  . VIRAL MENINGITIS 11/09/2006  . THRUSH  11/09/2006  . CA IN SITU, RECTUM 11/09/2006  . HYPOTHYROIDISM 11/09/2006  . DISORDER, BIPOLAR NOS 11/09/2006  . PYELONEPHRITIS 11/09/2006    Past Surgical History:  Procedure Laterality Date  . IR GENERIC HISTORICAL  11/04/2016   IR IVC FILTER PLMT / S&I Burke Keels GUID/MOD SED 11/04/2016 Aletta Edouard, MD [facility]-INTERV RAD       Home Medications    Prior to Admission medications   Medication Sig Start Date End Date Taking? Authorizing Provider  acetaminophen (TYLENOL) 500 MG tablet Take 1 tablet (500 mg total) by mouth every 6 (six) hours as needed for moderate pain. 11/07/16  Yes Eugenie Filler, MD  amLODipine (NORVASC) 10 MG tablet Take 1 tablet (10 mg total) by mouth daily. 11/20/16  Yes Arnoldo Morale, MD  apixaban (ELIQUIS) 2.5 MG TABS tablet Take 1 tablet (2.5 mg total) by mouth 2 (two) times daily. 11/13/16  Yes Eugenie Filler, MD  benztropine (COGENTIN) 1 MG tablet 1 tab in the morning 2 tabs at night 11/10/16  Yes Dionne Bucy McClung, PA-C  elvitegravir-cobicistat-emtricitabine-tenofovir (GENVOYA) 150-150-200-10 MG TABS tablet TAKE 1 TABLET BY MOUTH DAILY WITH BREAKFAST. Christena Flake [phone] to see Dr Johnnye Sima ASAP* 07/17/16  Yes Janece Canterbury, MD  gabapentin (NEURONTIN) 600 MG tablet Take 600 mg by mouth at bedtime.   Yes Historical Provider, MD  haloperidol (HALDOL) 10 MG tablet Take 10 mg by mouth 2 (two) times daily.   Yes Historical Provider, MD  ibuprofen (ADVIL,MOTRIN)  200 MG tablet Take 400 mg by mouth every 6 (six) hours as needed for moderate pain.    Yes Historical Provider, MD  magnesium oxide (MAG-OX) 400 (241.3 Mg) MG tablet Take 1 tablet (400 mg total) by mouth 2 (two) times daily. 11/07/16  Yes Eugenie Filler, MD  metoprolol (LOPRESSOR) 50 MG tablet Take 1 tablet (50 mg total) by mouth 2 (two) times daily. 11/20/16  Yes Arnoldo Morale, MD  mirtazapine (REMERON) 15 MG tablet Take 15 mg by mouth at bedtime.   Yes Historical Provider, MD  Multiple Vitamin (MULTIVITAMIN WITH  MINERALS) TABS tablet Take 1 tablet by mouth daily. 11/08/16  Yes Eugenie Filler, MD  potassium chloride SA (K-DUR,KLOR-CON) 20 MEQ tablet Take 1 tablet (20 mEq total) by mouth 3 (three) times daily. 11/24/16  Yes Arnoldo Morale, MD  thiamine 100 MG tablet Take 1 tablet (100 mg total) by mouth daily. 11/08/16  Yes Eugenie Filler, MD  tiotropium (SPIRIVA) 18 MCG inhalation capsule Place 1 capsule (18 mcg total) into inhaler and inhale daily. 11/08/16  Yes Eugenie Filler, MD  Valbenazine Tosylate [facility]) 80 MG CAPS Take 80 mg by mouth 2 (two) times daily.    Yes Historical Provider, MD    Family History Family History  Problem Relation Age of Onset  . CAD Mother   . Kidney disease Sister   . Lupus Brother   . Cancer Maternal Grandmother   . Stroke Paternal Grandmother     Social History Social History  Substance Use Topics  . Smoking status: Current Every Day Smoker    Packs/day: 1.00    Years: 1.00    Types: Cigarettes  . Smokeless tobacco: Never Used  . Alcohol use No     Comment: 8 months ago     Allergies   Amoxicillin and Latex   Review of Systems Review of Systems  Constitutional: Negative for fever.  Respiratory: Positive for cough and shortness of breath.   Cardiovascular: Positive for chest pain.  Allergic/Immunologic: Positive for immunocompromised state.  Neurological: Negative for syncope.  All other systems reviewed and are negative.    Physical Exam Updated Vital Signs BP 114/71   Pulse 91   Temp 98.5 F (36.9 C)   Resp 24   SpO2 98%   Physical Exam  Constitutional: He is oriented to person, place, and time. He appears well-developed and well-nourished. He appears distressed.  HENT:  Head: Normocephalic and atraumatic.  Eyes: Conjunctivae are normal. Pupils are equal, round, and reactive to light. Right eye exhibits no discharge. Left eye exhibits no discharge.  Neck: Normal range of motion. Neck supple.  Cardiovascular: Normal rate,  regular rhythm and intact distal pulses.   No murmur heard. Pulmonary/Chest: Effort normal and breath sounds normal. No respiratory distress. He has no wheezes. He has no rales. He exhibits no tenderness.  Abdominal: Soft. Bowel sounds are normal. He exhibits no distension and no mass. There is no tenderness. There is no rebound and no guarding.  Genitourinary:  Genitourinary Comments: Semi-erect penis - states does not hurt, states always looks this way, nontender  Musculoskeletal: He exhibits no edema.  Neurological: He is alert and oriented to person, place, and time.  Skin: Skin is warm. He is diaphoretic.  Psychiatric: He has a normal mood and affect.  Nursing note and vitals reviewed.    ED Treatments / Results  Labs (all labs ordered are listed, but only abnormal results are displayed) Labs Reviewed  BASIC METABOLIC  PANEL - Abnormal; Notable for the following:       Result Value   Potassium 3.3 (*)    CO2 19 (*)    BUN 22 (*)    All other components within normal limits  CBC - Abnormal; Notable for the following:    RBC 3.31 (*)    Hemoglobin 10.4 (*)    HCT 31.9 (*)    RDW 16.1 (*)    All other components within normal limits  BRAIN NATRIURETIC PEPTIDE  I-STAT TROPOININ, ED  I-STAT TROPOININ, ED    EKG  EKG Interpretation  Date/Time:  Saturday November 25 2016 20:59:56 EST Ventricular Rate:  79 PR Interval:    QRS Duration: 93 QT Interval:  392 QTC Calculation: 450 R Axis:   23 Text Interpretation:  Sinus rhythm hypertrophic change No significant change since last tracing Confirmed by LITTLE MD, RACHEL [phone]) on 11/26/2016 1:08:52 PM       Radiology Dg Chest 2 View  Result Date: 11/25/2016 CLINICAL DATA:  Acute onset of shortness of breath and generalized chest pain. Recently diagnosed with pulmonary embolus. Initial encounter. EXAM: CHEST  2 VIEW COMPARISON:  Chest radiograph performed 11/02/2016, and CTA of the chest performed 11/16/2016 FINDINGS: The lungs  are well-aerated. Minimal bibasilar atelectasis is noted. There is no evidence of pleural effusion or pneumothorax. The heart is mildly enlarged. No acute osseous abnormalities are seen. An IVC filter is partially visualized. IMPRESSION: Minimal bibasilar atelectasis. Mild cardiomegaly. This is stable from the recent prior CTA. Electronically Signed   By: Garald Balding M.D.   On: 11/25/2016 22:05   Ct Angio Chest Pe W And/or Wo Contrast  Result Date: 11/26/2016 CLINICAL DATA:  Shortness of breath for 3 days. Chest pain yesterday. Diagnosis of pulmonary embolus 1 month prior. EXAM: CT ANGIOGRAPHY CHEST WITH CONTRAST TECHNIQUE: Multidetector CT imaging of the chest was performed using the standard protocol during bolus administration of intravenous contrast. Multiplanar CT image reconstructions and MIPs were obtained to evaluate the vascular anatomy. CONTRAST:  100 cc Isovue 370 IV COMPARISON:  Chest CT 11/02/2016, chest CT 11/16/2016 FINDINGS: Cardiovascular: Continued decrease in pulmonary artery thromboembolic burden from prior exam. No definite residual thrombus is visualized, basilar evaluation is obscured by breathing motion artifact. Prominence of the main pulmonary artery measuring 3.1 cm, unchanged. Thoracic aortic atherosclerosis without aneurysm or dissection. Mild multi chamber cardiomegaly. Coronary artery calcifications. Mediastinum/Nodes: No adenopathy. The esophagus is decompressed. Visualized thyroid gland is unremarkable. Lungs/Pleura: Advanced emphysema. Bibasilar atelectasis, right greater than left. No confluent pneumonia. No evidence pulmonary edema. No pleural fluid. Minimal adherent mucous within the right lower lobe segmental bronchus. Trachea and mainstem bronchi are patent. Upper Abdomen: No evidence of acute abnormality. Musculoskeletal: There are no acute or suspicious osseous abnormalities. Review of the MIP images confirms the above findings. IMPRESSION: 1. Resolved pulmonary emboli  from prior exam, allowing for limited basilar assessment secondary to motion. No progressive thrombus from prior. 2. Advanced emphysema.  Bibasilar atelectasis. 3. Coronary artery calcifications.  Thoracic aortic atherosclerosis. Electronically Signed   By: Jeb Levering M.D.   On: 11/26/2016 01:47    Procedures Procedures (including critical care time)    EMERGENCY DEPARTMENT [place] CARDIAC EXAM "Study: Limited Ultrasound of the Heart and Pericardium"  INDICATIONS:Chest pain Multiple views of the heart and pericardium were obtained in real-time with a multi-frequency probe.  PERFORMED [id number] IMAGES ARCHIVED?: Yes LIMITATIONS:  Emergent procedure VIEWS USED: Subcostal 4 chamber, Parasternal long axis, Parasternal short axis, Apical 4 chamber  and Inferior Vena Cava INTERPRETATION: Cardiac activity present, Pericardial effusioin absent, Cardiac tamponade absent, Normal contractility and IVC normal   Medications Ordered in ED Medications  potassium chloride SA (K-DUR,KLOR-CON) CR tablet 40 mEq (40 mEq Oral Given 11/25/16 2246)  iopamidol (ISOVUE-370) 76 % injection (100 mLs  Contrast Given 11/26/16 0106)     Initial Impression / Assessment and Plan / ED Course  I have reviewed the triage vital signs and the nursing notes.  Pertinent labs & imaging results that were available during my care of the patient were reviewed by me and considered in my medical decision making (see chart for details).     Upon arrival, EKG without evidence of acute ischemia, Delta troponin is negative Patient has a known PE but ultrasound does not show indications for emergent TPA as patient is not hypotensive, significantly hypoxic, or with right heart strain Chest x-ray without evidence of pneumonia despite immunocompromised with HIV It is unclear if patient is having failure of PE treatment despite Eliquis CTA repeated and negative Pt feeling better, sleeping throughout ED course DC  In good condition  with close fu, strict return precautions   Final Clinical Impressions(s) / ED Diagnoses   Final diagnoses:  Chest pain, unspecified type    New Prescriptions Discharge Medication List as of 11/26/2016  1:52 AM       Karma Greaser, MD 11/26/16 2313    Carmin Muskrat, MD 11/28/16 1550

## 2016-11-26 ENCOUNTER — Encounter (HOSPITAL_COMMUNITY): Payer: Self-pay | Admitting: Radiology

## 2016-11-26 ENCOUNTER — Emergency Department (HOSPITAL_COMMUNITY): Payer: Medicare Other

## 2016-11-26 DIAGNOSIS — R0602 Shortness of breath: Secondary | ICD-10-CM | POA: Diagnosis not present

## 2016-11-26 LAB — I-STAT TROPONIN, ED: TROPONIN I, POC: 0 ng/mL (ref 0.00–0.08)

## 2016-11-26 MED ORDER — IOPAMIDOL (ISOVUE-370) INJECTION 76%
INTRAVENOUS | Status: AC
  Administered 2016-11-26: 100 mL
  Filled 2016-11-26: qty 100

## 2016-11-28 DIAGNOSIS — I2699 Other pulmonary embolism without acute cor pulmonale: Secondary | ICD-10-CM | POA: Diagnosis not present

## 2016-11-28 DIAGNOSIS — I82403 Acute embolism and thrombosis of unspecified deep veins of lower extremity, bilateral: Secondary | ICD-10-CM | POA: Diagnosis not present

## 2016-11-29 ENCOUNTER — Telehealth: Payer: Self-pay | Admitting: Family Medicine

## 2016-11-29 DIAGNOSIS — I82403 Acute embolism and thrombosis of unspecified deep veins of lower extremity, bilateral: Secondary | ICD-10-CM | POA: Diagnosis not present

## 2016-11-29 DIAGNOSIS — I2699 Other pulmonary embolism without acute cor pulmonale: Secondary | ICD-10-CM | POA: Diagnosis not present

## 2016-11-29 NOTE — Telephone Encounter (Signed)
Denyse Amass from [facility] called the office to speak with PCP regarding patient. He is discharging patient from home visits today.   Thank you.

## 2016-11-30 DIAGNOSIS — R079 Chest pain, unspecified: Secondary | ICD-10-CM | POA: Diagnosis not present

## 2016-11-30 NOTE — Telephone Encounter (Signed)
Writer called Denyse Amass who stated that he was just calling MD to give her an FYI that patient was being discharged from homecare.

## 2016-11-30 NOTE — Telephone Encounter (Signed)
Could you please take a message from [facility]? Thank you

## 2016-12-01 ENCOUNTER — Encounter (HOSPITAL_COMMUNITY): Payer: Self-pay

## 2016-12-01 ENCOUNTER — Emergency Department (HOSPITAL_COMMUNITY)
Admission: EM | Admit: 2016-12-01 | Discharge: 2016-12-02 | Disposition: A | Discharge status: Home / Self Care | Payer: Medicare Other | Attending: Emergency Medicine | Admitting: Emergency Medicine

## 2016-12-01 ENCOUNTER — Emergency Department (HOSPITAL_COMMUNITY): Payer: Medicare Other

## 2016-12-01 DIAGNOSIS — F10920 Alcohol use, unspecified with intoxication, uncomplicated: Secondary | ICD-10-CM

## 2016-12-01 DIAGNOSIS — R200 Anesthesia of skin: Secondary | ICD-10-CM | POA: Diagnosis not present

## 2016-12-01 DIAGNOSIS — Z7901 Long term (current) use of anticoagulants: Secondary | ICD-10-CM | POA: Diagnosis not present

## 2016-12-01 DIAGNOSIS — Z91040 Latex allergy status: Secondary | ICD-10-CM | POA: Diagnosis not present

## 2016-12-01 DIAGNOSIS — R0789 Other chest pain: Secondary | ICD-10-CM | POA: Diagnosis not present

## 2016-12-01 DIAGNOSIS — F17210 Nicotine dependence, cigarettes, uncomplicated: Secondary | ICD-10-CM | POA: Diagnosis not present

## 2016-12-01 DIAGNOSIS — R1084 Generalized abdominal pain: Secondary | ICD-10-CM | POA: Diagnosis not present

## 2016-12-01 DIAGNOSIS — R29818 Other symptoms and signs involving the nervous system: Secondary | ICD-10-CM | POA: Diagnosis not present

## 2016-12-01 DIAGNOSIS — R109 Unspecified abdominal pain: Secondary | ICD-10-CM | POA: Diagnosis not present

## 2016-12-01 DIAGNOSIS — R079 Chest pain, unspecified: Secondary | ICD-10-CM

## 2016-12-01 DIAGNOSIS — I10 Essential (primary) hypertension: Secondary | ICD-10-CM | POA: Diagnosis not present

## 2016-12-01 DIAGNOSIS — Z79899 Other long term (current) drug therapy: Secondary | ICD-10-CM | POA: Diagnosis not present

## 2016-12-01 DIAGNOSIS — F10120 Alcohol abuse with intoxication, uncomplicated: Secondary | ICD-10-CM | POA: Diagnosis present

## 2016-12-01 DIAGNOSIS — E039 Hypothyroidism, unspecified: Secondary | ICD-10-CM | POA: Insufficient documentation

## 2016-12-01 LAB — COMPREHENSIVE METABOLIC PANEL
ALBUMIN: 3 g/dL — AB (ref 3.5–5.0)
ALK PHOS: 35 U/L — AB (ref 38–126)
ALT: 16 U/L — ABNORMAL LOW (ref 17–63)
ANION GAP: 15 (ref 5–15)
AST: 21 U/L (ref 15–41)
BILIRUBIN TOTAL: 0.4 mg/dL (ref 0.3–1.2)
BUN: 18 mg/dL (ref 6–20)
CALCIUM: 8.5 mg/dL — AB (ref 8.9–10.3)
CO2: 17 mmol/L — ABNORMAL LOW (ref 22–32)
Chloride: 105 mmol/L (ref 101–111)
Creatinine, Ser: 0.86 mg/dL (ref 0.61–1.24)
GFR calc non Af Amer: >60 mL/min (ref >60)
GLUCOSE: 69 mg/dL (ref 65–99)
Potassium: 3.5 mmol/L (ref 3.5–5.1)
Sodium: 137 mmol/L (ref 135–145)
TOTAL PROTEIN: 5.7 g/dL — AB (ref 6.5–8.1)

## 2016-12-01 LAB — I-STAT CHEM 8, ED
BUN: 19 mg/dL (ref 6–20)
CALCIUM ION: 1.02 mmol/L — AB (ref 1.15–1.40)
CREATININE: 1 mg/dL (ref 0.61–1.24)
Chloride: 108 mmol/L (ref 101–111)
Glucose, Bld: 64 mg/dL — ABNORMAL LOW (ref 65–99)
HCT: 34 % — ABNORMAL LOW (ref 39.0–52.0)
Hemoglobin: 11.6 g/dL — ABNORMAL LOW (ref 13.0–17.0)
Potassium: 3.5 mmol/L (ref 3.5–5.1)
SODIUM: 139 mmol/L (ref 135–145)
TCO2: 18 mmol/L (ref 0–100)

## 2016-12-01 LAB — DIFFERENTIAL
Basophils Absolute: 0 10*3/uL (ref 0.0–0.1)
Basophils Relative: 0 %
EOS PCT: 0 %
Eosinophils Absolute: 0 10*3/uL (ref 0.0–0.7)
LYMPHS ABS: 2.2 10*3/uL (ref 0.7–4.0)
LYMPHS PCT: 27 %
MONO ABS: 0.6 10*3/uL (ref 0.1–1.0)
MONOS PCT: 7 %
Neutro Abs: 5.3 10*3/uL (ref 1.7–7.7)
Neutrophils Relative %: 66 %

## 2016-12-01 LAB — APTT: aPTT: 28 seconds (ref 24–36)

## 2016-12-01 LAB — PROTIME-INR
INR: 1.1
Prothrombin Time: 14.2 seconds (ref 11.4–15.2)

## 2016-12-01 LAB — I-STAT TROPONIN, ED: Troponin i, poc: 0.01 ng/mL (ref 0.00–0.08)

## 2016-12-01 LAB — CBC
HCT: 33.2 % — ABNORMAL LOW (ref 39.0–52.0)
HEMOGLOBIN: 11 g/dL — AB (ref 13.0–17.0)
MCH: 30.7 pg (ref 26.0–34.0)
MCHC: 33.1 g/dL (ref 30.0–36.0)
MCV: 92.7 fL (ref 78.0–100.0)
Platelets: 151 10*3/uL (ref 150–400)
RBC: 3.58 MIL/uL — AB (ref 4.22–5.81)
RDW: 16.7 % — ABNORMAL HIGH (ref 11.5–15.5)
WBC: 8.2 10*3/uL (ref 4.0–10.5)

## 2016-12-01 LAB — ETHANOL: Alcohol, Ethyl (B): 176 mg/dL — ABNORMAL HIGH (ref <5)

## 2016-12-01 LAB — CBG MONITORING, ED: GLUCOSE-CAPILLARY: 66 mg/dL (ref 65–99)

## 2016-12-01 MED ORDER — IOPAMIDOL (ISOVUE-370) INJECTION 76%
INTRAVENOUS | Status: AC
  Administered 2016-12-02: 100 mL
  Filled 2016-12-01: qty 100

## 2016-12-01 NOTE — ED Notes (Signed)
Cancel Code Stroke per Betsey Holiday, MD.

## 2016-12-01 NOTE — ED Provider Notes (Signed)
[facility] DEPT Provider Note   CSN: [id number] Arrival date & time: 12/01/16  2320  By signing my name below, I, Dora Sims, attest that this documentation has been prepared under the direction and in the presence of physician practitioner, [name], MD. Electronically Signed: Dora Sims, Scribe. 12/01/2016. 11:39 PM.  History   Chief Complaint Chief Complaint  Patient presents with  . Chest Pain  . Abdominal Pain  . Alcohol Intoxication    The history is provided by the patient and the EMS personnel. No language interpreter was used.     HPI Comments: [name] Herring is a 56 y.o. male with PMHx including HIV, HTN, PE, DVT, and paranoid schizophrenia who presents to the Emergency Department via EMS as a code stroke. Per EMS, patient appeared intoxicated upon arrival to his home and patient endorses drinking vodka tonight. He has been complaining of abdominal pain, chest pain, and left-sided facial numbness for about two hours, all of which presented after drinking alcohol tonight. Pt also reports a persistent non-productive cough for the last few days with secondary shortness of breath. No h/o liver or pancreas problems. He denies any other complaints at this time.  Past Medical History:  Diagnosis Date  . Depression   . HIV (human immunodeficiency virus infection) ([place])   . Hypertension   . Immune deficiency disorder ([place])   . PE (pulmonary thromboembolism) ([place]) 2018  . Personality disorder   . Schizophrenia [facility])     Patient Active Problem List   Diagnosis Date Noted  . Emphysema lung ([place]) 11/20/2016  . Deep vein thrombosis (DVT) of left lower extremity ([place])   . DVT of lower extremity, bilateral ([place]) 11/04/2016  . Tobacco abuse 11/02/2016  . PE (pulmonary thromboembolism) ([place]) 11/02/2016  . Acute respiratory failure with hypoxia ([place]) 07/17/2016  . CAP (community acquired pneumonia) 07/16/2016  . Cocaine dependence with cocaine-induced mood  disorder ([place]) 03/29/2016  . HTN (hypertension) 08/25/2015  . Paranoid schizophrenia, chronic condition ([place]) 12/30/2014  . Cocaine use disorder, severe, dependence ([place]) 12/27/2014  . Alcohol use disorder, severe, dependence ([place]) 12/27/2014  . ERECTILE DYSFUNCTION 09/03/2007  . FOOT PAIN, BILATERAL 07/10/2007  . Human immunodeficiency virus (HIV) disease ([place]) 11/09/2006  . VIRAL MENINGITIS 11/09/2006  . THRUSH 11/09/2006  . CA IN SITU, RECTUM 11/09/2006  . HYPOTHYROIDISM 11/09/2006  . DISORDER, BIPOLAR NOS 11/09/2006  . PYELONEPHRITIS 11/09/2006    Past Surgical History:  Procedure Laterality Date  . IR GENERIC HISTORICAL  11/04/2016   IR IVC FILTER PLMT / S&I [facility] Keels GUID/MOD SED 11/04/2016 Aletta Edouard, MD [facility]-INTERV RAD       Home Medications    Prior to Admission medications   Medication Sig Start Date End Date Taking? Authorizing Provider  acetaminophen (TYLENOL) 500 MG tablet Take 1 tablet (500 mg total) by mouth every 6 (six) hours as needed for moderate pain. 11/07/16  Yes Eugenie Filler, MD  amLODipine (NORVASC) 10 MG tablet Take 1 tablet (10 mg total) by mouth daily. 11/20/16  Yes Arnoldo Morale, MD  apixaban (ELIQUIS) 2.5 MG TABS tablet Take 1 tablet (2.5 mg total) by mouth 2 (two) times daily. 11/13/16  Yes Eugenie Filler, MD  benztropine (COGENTIN) 1 MG tablet 1 tab in the morning 2 tabs at night 11/10/16  Yes Dionne Bucy McClung, PA-C  elvitegravir-cobicistat-emtricitabine-tenofovir (GENVOYA) 150-150-200-10 MG TABS tablet TAKE 1 TABLET BY MOUTH DAILY WITH BREAKFAST. Christena Flake [phone] to see Dr Johnnye Sima ASAP* 07/17/16  Yes Janece Canterbury, MD  gabapentin (  NEURONTIN) 600 MG tablet Take 600 mg by mouth at bedtime.   Yes Historical Provider, MD  haloperidol (HALDOL) 10 MG tablet Take 10 mg by mouth 2 (two) times daily.   Yes Historical Provider, MD  ibuprofen (ADVIL,MOTRIN) 200 MG tablet Take 400 mg by mouth every 6 (six) hours as needed for moderate pain.    Yes Historical  Provider, MD  magnesium oxide (MAG-OX) 400 (241.3 Mg) MG tablet Take 1 tablet (400 mg total) by mouth 2 (two) times daily. 11/07/16  Yes Eugenie Filler, MD  metoprolol (LOPRESSOR) 50 MG tablet Take 1 tablet (50 mg total) by mouth 2 (two) times daily. 11/20/16  Yes Arnoldo Morale, MD  mirtazapine (REMERON) 15 MG tablet Take 15 mg by mouth at bedtime.   Yes Historical Provider, MD  Multiple Vitamin (MULTIVITAMIN WITH MINERALS) TABS tablet Take 1 tablet by mouth daily. 11/08/16  Yes Eugenie Filler, MD  potassium chloride SA (K-DUR,KLOR-CON) 20 MEQ tablet Take 1 tablet (20 mEq total) by mouth 3 (three) times daily. 11/24/16  Yes Arnoldo Morale, MD  thiamine 100 MG tablet Take 1 tablet (100 mg total) by mouth daily. 11/08/16  Yes Eugenie Filler, MD  tiotropium (SPIRIVA) 18 MCG inhalation capsule Place 1 capsule (18 mcg total) into inhaler and inhale daily. 11/08/16  Yes Eugenie Filler, MD  Valbenazine Tosylate [facility]) 80 MG CAPS Take 80 mg by mouth 2 (two) times daily.    Yes Historical Provider, MD    Family History Family History  Problem Relation Age of Onset  . CAD Mother   . Kidney disease Sister   . Lupus Brother   . Cancer Maternal Grandmother   . Stroke Paternal Grandmother     Social History Social History  Substance Use Topics  . Smoking status: Current Every Day Smoker    Packs/day: 1.00    Years: 1.00    Types: Cigarettes  . Smokeless tobacco: Never Used  . Alcohol use No     Comment: 8 months ago     Allergies   Amoxicillin and Latex   Review of Systems Review of Systems  Respiratory: Positive for cough and shortness of breath.   Cardiovascular: Positive for chest pain.  Gastrointestinal: Positive for abdominal pain.  Neurological: Positive for numbness.  All other systems reviewed and are negative.   Physical Exam Updated Vital Signs BP (!) 159/101 (BP Location: Left Arm)   Pulse 72   Temp 97.4 F (36.3 C) (Oral)   Resp 20   SpO2 94%   Physical  Exam  Constitutional: He is oriented to person, place, and time. He appears well-developed and well-nourished. No distress.  HENT:  Head: Normocephalic and atraumatic.  Right Ear: Hearing normal.  Left Ear: Hearing normal.  Nose: Nose normal.  Mouth/Throat: Oropharynx is clear and moist and mucous membranes are normal.  Eyes: Conjunctivae and EOM are normal. Pupils are equal, round, and reactive to light.  Neck: Normal range of motion. Neck supple.  Cardiovascular: Regular rhythm, S1 normal and S2 normal.  Exam reveals no gallop and no friction rub.   No murmur heard. Pulmonary/Chest: Effort normal and breath sounds normal. No respiratory distress. He exhibits tenderness.  Diffuse chest wall tenderness.  Abdominal: Soft. Normal appearance and bowel sounds are normal. There is no hepatosplenomegaly. There is tenderness. There is no rebound, no guarding, no tenderness at McBurney's point and negative Murphy's sign. No hernia.  Diffuse abdominal tenderness.  Musculoskeletal: Normal range of motion.  Neurological: He  is alert and oriented to person, place, and time. He has normal strength. No cranial nerve deficit. Coordination normal. GCS eye subscore is 4. GCS verbal subscore is 5. GCS motor subscore is 6.  Subjective decreased sensation to left side of face. No motor deficits.  Skin: Skin is warm, dry and intact. No rash noted. No cyanosis.  Psychiatric: He has a normal mood and affect. His speech is normal and behavior is normal. Thought content normal.  Nursing note and vitals reviewed.   ED Treatments / Results  Labs (all labs ordered are listed, but only abnormal results are displayed) Labs Reviewed  ETHANOL - Abnormal; Notable for the following:       Result Value   Alcohol, Ethyl (B) 176 (*)    All other components within normal limits  CBC - Abnormal; Notable for the following:    RBC 3.58 (*)    Hemoglobin 11.0 (*)    HCT 33.2 (*)    RDW 16.7 (*)    All other components  within normal limits  COMPREHENSIVE METABOLIC PANEL - Abnormal; Notable for the following:    CO2 17 (*)    Calcium 8.5 (*)    Total Protein 5.7 (*)    Albumin 3.0 (*)    ALT 16 (*)    Alkaline Phosphatase 35 (*)    All other components within normal limits  URINALYSIS, ROUTINE W REFLEX MICROSCOPIC - Abnormal; Notable for the following:    Specific Gravity, Urine >1.046 (*)    Hgb urine dipstick SMALL (*)    All other components within normal limits  I-STAT CHEM 8, ED - Abnormal; Notable for the following:    Glucose, Bld 64 (*)    Calcium, Ion 1.02 (*)    Hemoglobin 11.6 (*)    HCT 34.0 (*)    All other components within normal limits  PROTIME-INR  APTT  DIFFERENTIAL  RAPID URINE DRUG SCREEN, HOSP PERFORMED  LIPASE, BLOOD  I-STAT TROPOININ, ED  CBG MONITORING, ED    EKG  EKG Interpretation  Date/Time:  Friday December 01 2016 23:36:20 EST Ventricular Rate:  72 PR Interval:    QRS Duration: 93 QT Interval:  411 QTC Calculation: 450 R Axis:   18 Text Interpretation:  Sinus rhythm Normal ECG Confirmed by POLLINA  MD, CHRISTOPHER [phone]) on 12/01/2016 11:49:29 PM       Radiology Dg Chest 2 View  Result Date: 12/02/2016 CLINICAL DATA:  Chest pain and dyspnea tonight EXAM: CHEST  2 VIEW COMPARISON:  11/25/2016 FINDINGS: Upper lobe emphysematous changes. No airspace consolidation. Normal pulmonary vasculature. Hilar and mediastinal contours are normal. No pleural effusions. IMPRESSION: Emphysematous lung disease.  No consolidation or effusion. Electronically Signed   By: Andreas Newport M.D.   On: 12/02/2016 00:50   Mr Brain Wo Contrast  Result Date: 12/02/2016 CLINICAL DATA:  Initial evaluation for left facial numbness. EXAM: MRI HEAD WITHOUT CONTRAST TECHNIQUE: Multiplanar, multiecho pulse sequences of the brain and surrounding structures were obtained without intravenous contrast. COMPARISON:  Prior CT from 12/01/2016. FINDINGS: Brain: Diffuse prominence of the CSF  containing spaces is compatible with generalized cerebral atrophy, advanced for age. Patchy and confluent T2/FLAIR hyperintensity within the periventricular white matter most consistent with chronic small vessel ischemic disease, mild to moderate nature. No abnormal foci of restricted diffusion to suggest acute or subacute ischemia. Gray-white matter differentiation maintained. No encephalomalacia to suggest chronic infarction. No evidence for acute or chronic intracranial hemorrhage. No mass lesion, midline shift, or mass  effect. No hydrocephalus. No extra-axial fluid collection. Major dural sinuses are patent. Pituitary and suprasellar region within normal limits. Vascular: The major intracranial vascular flow voids are maintained. Skull and upper cervical spine: Craniocervical junction normal. Visualized upper cervical spine unremarkable. Bone marrow signal intensity within normal limits. No scalp soft tissue abnormality. Sinuses/Orbits: Globes and orbital soft tissues within normal limits. Retention cyst noted within the left maxillary sinus. Scattered mucosal thickening within the remainder the paranasal sinuses. No mastoid effusion. Inner ear structures normal. Other: None. IMPRESSION: 1. No acute intracranial infarct or other process identified. 2. Advanced cerebral atrophy for age with mild to moderate chronic microvascular ischemic disease. Electronically Signed   By: Jeannine Boga M.D.   On: 12/02/2016 05:56   Ct Angio Chest/abd/pel For Dissection W And/or Wo Contrast  Result Date: 12/02/2016 CLINICAL DATA:  Sudden onset chest abdominal pain about 3 hours ago. EXAM: CT ANGIOGRAPHY CHEST, ABDOMEN AND PELVIS TECHNIQUE: Multidetector CT imaging through the chest, abdomen and pelvis was performed using the standard protocol during bolus administration of intravenous contrast. Multiplanar reconstructed images and MIPs were obtained and reviewed to evaluate the vascular anatomy. CONTRAST:  100 mL  Isovue 370 intravenous COMPARISON:  11/26/2016, 11/16/2016, 11/02/2016 FINDINGS: CTA CHEST FINDINGS Cardiovascular: Preferential opacification of the thoracic aorta. No evidence of thoracic aortic aneurysm or dissection. Normal heart size. No pericardial effusion. No recurrent pulmonary emboli. There is enlargement of the central pulmonary arteries, consistent with pulmonary arterial hypertension. Mediastinum/Nodes: No enlarged mediastinal, hilar, or axillary lymph nodes. Thyroid gland, trachea, and esophagus demonstrate no significant findings. Lungs/Pleura: Centrilobular emphysematous disease, upper lobe predominant. No airspace consolidation. Airways are patent and normal in caliber. No pleural effusions. Musculoskeletal: No significant skeletal lesions. Review of the MIP images confirms the above findings. CTA ABDOMEN AND PELVIS FINDINGS VASCULAR Aorta: Normal caliber aorta without aneurysm, dissection, vasculitis or significant stenosis. Moderate atherosclerotic calcifications Celiac: Patent without evidence of aneurysm, dissection, vasculitis or significant stenosis. SMA: Patent without evidence of aneurysm, dissection, vasculitis or significant stenosis. Renals: Single main left renal artery. Accessory artery to the lower pole right kidney. All renal arteries are patent without evidence of aneurysm, dissection, vasculitis, fibromuscular dysplasia or significant stenosis. IMA: Patent without evidence of aneurysm, dissection, vasculitis or significant stenosis. Inflow: Moderate atherosclerotic calcification. Moderate ectasia, up to 1.8 cm in the distal right common iliac artery. Both iliac bifurcations are patent. External iliac arteries are moderately ectatic, 12 mm. Common femoral arteries and femoral bifurcations are widely patent bilaterally. Veins: No obvious venous abnormality within the limitations of this arterial phase study. IVC filter noted. Review of the MIP images confirms the above findings.  NON-VASCULAR Hepatobiliary: No focal liver abnormality is seen. No gallstones, gallbladder wall thickening, or biliary dilatation. Pancreas: Unremarkable. No pancreatic ductal dilatation or surrounding inflammatory changes. Spleen: Normal in size without focal abnormality. Adrenals/Urinary Tract: Adrenal glands are unremarkable. Kidneys are normal, without renal calculi, focal lesion, or hydronephrosis. Bladder is unremarkable. Stomach/Bowel: Marked mural thickening and mucosal enhancement of the duodenum. This could represent duodenitis or peptic ulcer disease. No bowel obstruction or perforation. Lymphatic: No significant vascular findings are present. No enlarged abdominal or pelvic lymph nodes. Reproductive: Unremarkable Other: No ascites Musculoskeletal: No significant skeletal lesion. Review of the MIP images confirms the above findings. IMPRESSION: 1. Enlarged central pulmonary arteries suggesting pulmonary arterial hypertension. No evidence of recurrent pulmonary emboli. 2. Moderate atherosclerotic calcification. No aortic dissection. Widely patent branches of the aorta. 3. Mural thickening and mucosal enhancement of the duodenum. This could  represent peptic ulcer disease or duodenitis. No extraluminal air. Electronically Signed   By: Andreas Newport M.D.   On: 12/02/2016 00:47   Ct Head Code Stroke W/o Cm  Addendum Date: 12/02/2016   ADDENDUM REPORT: 12/01/2016 23:59 ADDENDUM: Results were discussed with Dr. Cheral Marker on 12/01/2016  at 11:56 pm. Electronically Signed   By: Jeannine Boga M.D.   On: 12/01/2016 23:59   Result Date: 12/01/2016 CLINICAL DATA:  Code stroke. Initial evaluation for acute left-sided facial droop. EXAM: CT HEAD WITHOUT CONTRAST TECHNIQUE: Contiguous axial images were obtained from the base of the skull through the vertex without intravenous contrast. COMPARISON:  None available. FINDINGS: Brain: Diffuse prominence of the CSF containing spaces is compatible with generalized  cerebral atrophy, advanced for age. Patchy hypodensity within the periventricular white matter most compatible chronic small vessel ischemic disease. No acute intracranial hemorrhage. No findings to suggest acute large vessel territory infarct. Insular ribbon preserved. Deep gray nuclei maintained. No mass lesion, midline shift, or mass effect. Ventricular prominence related to global parenchymal volume loss of hydrocephalus. No extra-axial fluid collection. Vascular: No asymmetric hyperdense vessel. Scattered vascular calcifications noted within the carotid siphons. Skull: Scalp soft tissues demonstrate no acute abnormality. Calvarium intact. Sinuses/Orbits: Globes and orbital soft tissues within normal limits. Right word gaze noted. Paranasal sinuses are clear.  No mastoid effusion. Other: No other significant finding. ASPECTS [facility] Stroke Program Early CT Score) - Ganglionic level infarction (caudate, lentiform nuclei, internal capsule, insula, M1-M3 cortex): 7 - Supraganglionic infarction (M4-M6 cortex): 3 Total score (0-10 with 10 being normal): 10 IMPRESSION: 1. No acute intracranial infarct or other process identified. No hemorrhage. 2. ASPECTS is 10. 3. Advanced for age cerebral atrophy with mild chronic microvascular ischemic disease. Dr. Cheral Marker of the Stroke Neurology service has been paged regarding these findings on 12/01/2016 at 11:46 pm. Results will be conveyed as soon as possible when a call back is received. Electronically Signed: By: Jeannine Boga M.D. On: 12/01/2016 23:53    Procedures Procedures (including critical care time)  DIAGNOSTIC STUDIES: Oxygen Saturation is 97% on RA, normal by my interpretation.    COORDINATION OF CARE: 11:44 PM Discussed treatment plan with pt at bedside and pt agreed to plan.  Medications Ordered in ED Medications  iopamidol (ISOVUE-370) 76 % injection (100 mLs  Contrast Given 12/02/16 0008)     Initial Impression / Assessment and Plan / ED  Course  I have reviewed the triage vital signs and the nursing notes.  Pertinent labs & imaging results that were available during my care of the patient were reviewed by me and considered in my medical decision making (see chart for details).  2:33 AM: on reevaluation, patient states his symptoms have improved and he feels better.  Patient brought to the ER with multiple complaints. Patient had been experiencing abdominal and chest pain for most of the day. This was the initial call to EMS. During their evaluation, however, he also mentioned that the left side of his face was numb. He does not have any objective findings on examination. He has a normal neurologic examination. MRI performed to further evaluate, no evidence of acute stroke or other acute abnormality noted.  Patient reported progressive improvement of his chest and abdominal pain without intervention. This is likely secondary to his alcohol abuse and intoxication, possibly gastritis. No evidence of hypoxia or tachycardia. CT chest, abdomen and pelvis performed. No evidence of aortic aneurysm or large PE present on exam. Patient will be discharged, follow-up  with primary doctor.  Final Clinical Impressions(s) / ED Diagnoses   Final diagnoses:  Chest pain, unspecified type  Abdominal pain, unspecified abdominal location  Acute alcoholic intoxication without complication [facility])    New Prescriptions New Prescriptions   No medications on file   I personally performed the services described in this documentation, which was scribed in my presence. The recorded information has been reviewed and is accurate.    [name], MD 12/02/16 [phone]

## 2016-12-01 NOTE — ED Triage Notes (Signed)
Per EMS: Pt comlpaining of Chest pain, abdominal pain and L facial numbness. Pt with no focal neuro deficits upon arrival. Pt states drank ~6oz of vodka and coffee tonight. Pt a/o  X 4, NAD. Pt states on Eliquis, takes meds as rx'd.

## 2016-12-02 ENCOUNTER — Emergency Department (HOSPITAL_COMMUNITY): Payer: Medicare Other

## 2016-12-02 ENCOUNTER — Encounter (HOSPITAL_COMMUNITY): Payer: Self-pay | Admitting: Radiology

## 2016-12-02 DIAGNOSIS — R109 Unspecified abdominal pain: Secondary | ICD-10-CM | POA: Diagnosis not present

## 2016-12-02 DIAGNOSIS — R079 Chest pain, unspecified: Secondary | ICD-10-CM | POA: Diagnosis not present

## 2016-12-02 LAB — URINALYSIS, ROUTINE W REFLEX MICROSCOPIC
BACTERIA UA: NONE SEEN
Bilirubin Urine: NEGATIVE
Glucose, UA: NEGATIVE mg/dL
Ketones, ur: NEGATIVE mg/dL
Leukocytes, UA: NEGATIVE
NITRITE: NEGATIVE
PROTEIN: NEGATIVE mg/dL
SQUAMOUS EPITHELIAL / LPF: NONE SEEN
pH: 5 (ref 5.0–8.0)

## 2016-12-02 LAB — RAPID URINE DRUG SCREEN, HOSP PERFORMED
AMPHETAMINES: NOT DETECTED
Barbiturates: NOT DETECTED
Benzodiazepines: NOT DETECTED
Cocaine: NOT DETECTED
Opiates: NOT DETECTED
TETRAHYDROCANNABINOL: NOT DETECTED

## 2016-12-02 LAB — LIPASE, BLOOD: LIPASE: 14 U/L (ref 11–51)

## 2016-12-02 NOTE — Code Documentation (Signed)
Code stroke called on pt at 2306.  Per pt, [id number].   Pt c/o L face numbness, chest pain, and abd pain.  Pt admitted to ETOH consumptioon.   CT negative for acute findings.  NIH-1. Code stroke cancelled per neuro.

## 2016-12-02 NOTE — ED Notes (Signed)
Pt taken to MRI  

## 2016-12-18 ENCOUNTER — Encounter: Payer: Self-pay | Admitting: Family Medicine

## 2016-12-18 ENCOUNTER — Ambulatory Visit: Payer: Medicare Other | Attending: Family Medicine | Admitting: Family Medicine

## 2016-12-18 VITALS — BP 107/71 | HR 94 | Temp 97.6°F | Resp 16 | Wt 140.0 lb

## 2016-12-18 DIAGNOSIS — J439 Emphysema, unspecified: Secondary | ICD-10-CM | POA: Insufficient documentation

## 2016-12-18 DIAGNOSIS — F200 Paranoid schizophrenia: Secondary | ICD-10-CM | POA: Diagnosis not present

## 2016-12-18 DIAGNOSIS — Z86718 Personal history of other venous thrombosis and embolism: Secondary | ICD-10-CM | POA: Diagnosis not present

## 2016-12-18 DIAGNOSIS — F329 Major depressive disorder, single episode, unspecified: Secondary | ICD-10-CM | POA: Diagnosis not present

## 2016-12-18 DIAGNOSIS — Z880 Allergy status to penicillin: Secondary | ICD-10-CM | POA: Insufficient documentation

## 2016-12-18 DIAGNOSIS — B20 Human immunodeficiency virus [HIV] disease: Secondary | ICD-10-CM | POA: Diagnosis not present

## 2016-12-18 DIAGNOSIS — Z7901 Long term (current) use of anticoagulants: Secondary | ICD-10-CM | POA: Insufficient documentation

## 2016-12-18 DIAGNOSIS — J438 Other emphysema: Secondary | ICD-10-CM

## 2016-12-18 DIAGNOSIS — I10 Essential (primary) hypertension: Secondary | ICD-10-CM | POA: Insufficient documentation

## 2016-12-18 DIAGNOSIS — I2699 Other pulmonary embolism without acute cor pulmonale: Secondary | ICD-10-CM | POA: Diagnosis not present

## 2016-12-18 DIAGNOSIS — I82433 Acute embolism and thrombosis of popliteal vein, bilateral: Secondary | ICD-10-CM

## 2016-12-18 MED ORDER — APIXABAN 2.5 MG PO TABS: 2.5000 mg | ORAL_TABLET | Freq: Two times a day (BID) | ORAL | 3 refills | Status: DC

## 2016-12-18 MED FILL — ELIQUIS 2.5 MG TABLET: 2.5 | 30 days supply | Qty: 60 | Fill #0

## 2016-12-18 NOTE — Patient Instructions (Signed)
Chronic Obstructive Pulmonary Disease Chronic obstructive pulmonary disease (COPD) is a common lung condition in which airflow from the lungs is limited. COPD is a general term that can be used to describe many different lung problems that limit airflow, including both chronic bronchitis and emphysema. If you have COPD, your lung function will probably never return to normal, but there are measures you can take to improve lung function and make yourself feel better. What are the causes?  Smoking (common).  Exposure to secondhand smoke.  Genetic problems.  Chronic inflammatory lung diseases or recurrent infections. What are the signs or symptoms?  Shortness of breath, especially with physical activity.  Deep, persistent (chronic) cough with a large amount of thick mucus.  Wheezing.  Rapid breaths (tachypnea).  Gray or bluish discoloration (cyanosis) of the skin, especially in your fingers, toes, or lips.  Fatigue.  Weight loss.  Frequent infections or episodes when breathing symptoms become much worse (exacerbations).  Chest tightness. How is this diagnosed? Your health care provider will take a medical history and perform a physical examination to diagnose COPD. Additional tests for COPD may include:  Lung (pulmonary) function tests.  Chest X-ray.  CT scan.  Blood tests. How is this treated? Treatment for COPD may include:  Inhaler and nebulizer medicines. These help manage the symptoms of COPD and make your breathing more comfortable.  Supplemental oxygen. Supplemental oxygen is only helpful if you have a low oxygen level in your blood.  Exercise and physical activity. These are beneficial for nearly all people with COPD.  Lung surgery or transplant.  Nutrition therapy to gain weight, if you are underweight.  Pulmonary rehabilitation. This may involve working with a team of health care providers and specialists, such as respiratory, occupational, and physical  therapists. Follow these instructions at home:  Take all medicines (inhaled or pills) as directed by your health care provider.  Avoid over-the-counter medicines or cough syrups that dry up your airway (such as antihistamines) and slow down the elimination of secretions unless instructed otherwise by your health care provider.  If you are a smoker, the most important thing that you can do is stop smoking. Continuing to smoke will cause further lung damage and breathing trouble. Ask your health care provider for help with quitting smoking. He or she can direct you to community resources or hospitals that provide support.  Avoid exposure to irritants such as smoke, chemicals, and fumes that aggravate your breathing.  Use oxygen therapy and pulmonary rehabilitation if directed by your health care provider. If you require home oxygen therapy, ask your health care provider whether you should purchase a pulse oximeter to measure your oxygen level at home.  Avoid contact with individuals who have a contagious illness.  Avoid extreme temperature and humidity changes.  Eat healthy foods. Eating smaller, more frequent meals and resting before meals may help you maintain your strength.  Stay active, but balance activity with periods of rest. Exercise and physical activity will help you maintain your ability to do things you want to do.  Preventing infection and hospitalization is very important when you have COPD. Make sure to receive all the vaccines your health care provider recommends, especially the pneumococcal and influenza vaccines. Ask your health care provider whether you need a pneumonia vaccine.  Learn and use relaxation techniques to manage stress.  Learn and use controlled breathing techniques as directed by your health care provider. Controlled breathing techniques include: 1. Pursed lip breathing. Start by breathing in (inhaling)   through your nose for 1 second. Then, purse your lips as  if you were going to whistle and breathe out (exhale) through the pursed lips for 2 seconds. 2. Diaphragmatic breathing. Start by putting one hand on your abdomen just above your waist. Inhale slowly through your nose. The hand on your abdomen should move out. Then purse your lips and exhale slowly. You should be able to feel the hand on your abdomen moving in as you exhale.  Learn and use controlled coughing to clear mucus from your lungs. Controlled coughing is a series of short, progressive coughs. The steps of controlled coughing are: 1. Lean your head slightly forward. 2. Breathe in deeply using diaphragmatic breathing. 3. Try to hold your breath for 3 seconds. 4. Keep your mouth slightly open while coughing twice. 5. Spit any mucus out into a tissue. 6. Rest and repeat the steps once or twice as needed. Contact a health care provider if:  You are coughing up more mucus than usual.  There is a change in the color or thickness of your mucus.  Your breathing is more labored than usual.  Your breathing is faster than usual. Get help right away if:  You have shortness of breath while you are resting.  You have shortness of breath that prevents you from:  Being able to talk.  Performing your usual physical activities.  You have chest pain lasting longer than 5 minutes.  Your skin color is more cyanotic than usual.  You measure low oxygen saturations for longer than 5 minutes with a pulse oximeter. This information is not intended to replace advice given to you by your health care provider. Make sure you discuss any questions you have with your health care provider. Document Released: 06/21/2005 Document Revised: 02/17/2016 Document Reviewed: 05/08/2013 Elsevier Interactive Patient Education  2017 [employer] Inc.  

## 2016-12-18 NOTE — Progress Notes (Signed)
RF on eliquis

## 2016-12-18 NOTE — Progress Notes (Signed)
Subjective:    Patient ID: [name] Herring, male    DOB: August 18, 1961, 56 y.o.   MRN: [id number]  HPI ELAND LAMANTIA presents Is a 56 year old male with a history of hypertension, HIV (CD4 count of 470, viral load of less than 20 currently on antiretroviral therapy),recent hospitalization (2/8 - 11/07/16 at [facility]) for bilateral submassive pulmonary embolism, acute DVT of branch of the R Gastrocnemius vein, left lower extremity acute mobile thrombus of the proximal popliteal vein,acute DVT of the  distal to mid posterior tibial vein with right heart strain.  During his hospitalization he underwent placement of IVC filter on 11/04/16 after which he was maintained on heparin which was later transitioned to Eliquis. 2-D echo revealed EF of 50-55%, no regional wall motion abnormalities, grade 1 diastolic dysfunction. His electrolytes were repleted and he was subsequently discharged with home health aide. After discharge he presented to the ED 4 days ago with chest pain and CT chest revealed markedly reduced pulmonary embolism burden; has been to see his infectious disease specialist-Dr. Johnnye Sima.  Interval history: He was placed on metoprolol at the last visit due to tachycardia and hypertension; his blood pressure is normal and had which has normalized. He denies any acute concerns at this time. He is not currently receiving mental health care but informs me he previously did at [facility].  Past Medical History:  Diagnosis Date  . Depression   . HIV (human immunodeficiency virus infection) ([place])   . Hypertension   . Immune deficiency disorder ([place])   . PE (pulmonary thromboembolism) ([place]) 2018  . Personality disorder   . Schizophrenia [facility])     Past Surgical History:  Procedure Laterality Date  . IR GENERIC HISTORICAL  11/04/2016   IR IVC FILTER PLMT / S&I Burke Keels GUID/MOD SED 11/04/2016 Aletta Edouard, MD [facility]-INTERV RAD    Allergies  Allergen Reactions  . Amoxicillin  Shortness Of Breath and Rash    Has patient had a PCN reaction causing immediate rash, facial/tongue/throat swelling, [name] Has patient had a PCN reaction causing severe rash involving mucus membranes or skin necrosis: no Has patient had a PCN reaction that required hospitalization: yes drs office visit Has patient had a PCN reaction occurring within the last 10 years: no If all of the above answers are "NO", then may proceed with Cephalosporin use.   . Latex Shortness Of Breath      Review of Systems  Constitutional: Negative for activity change and appetite change.  HENT: Negative for sinus pressure and sore throat.   Eyes: Negative for visual disturbance.  Respiratory: Negative for cough, chest tightness and shortness of breath.   Cardiovascular: Negative for chest pain and leg swelling.  Gastrointestinal: Negative for abdominal distention, abdominal pain, constipation and diarrhea.  Endocrine: Negative.   Genitourinary: Negative for dysuria.  Musculoskeletal: Negative for joint swelling and myalgias.  Skin: Negative for rash.  Allergic/Immunologic: Negative.   Neurological: Negative for weakness, light-headedness and numbness.  Psychiatric/Behavioral: Negative for dysphoric mood and suicidal ideas.       Objective: Vitals:   12/18/16 0846  BP: 107/71  Pulse: 94  Resp: 16  Temp: 97.6 F (36.4 C)  TempSrc: Oral  SpO2: 97%  Weight: 140 lb (63.5 kg)      Physical Exam  Constitutional: He is oriented to person, place, and time. He appears well-developed and well-nourished.  HENT:  Right Ear: External ear normal.  Left Ear: External ear normal.  Cardiovascular: Normal rate, normal heart sounds and intact distal pulses.   No murmur heard. Pulmonary/Chest: Effort normal and breath sounds normal. He has no wheezes. He has no rales. He exhibits no tenderness.  Abdominal: Soft. Bowel sounds are normal. He exhibits no distension and no  mass. There is no tenderness.  Musculoskeletal: Normal range of motion.  Neurological: He is alert and oriented to person, place, and time.  Skin: Skin is warm and dry.  Psychiatric: He has a normal mood and affect.          Assessment & Plan:  1. Essential hypertension Controlled Low-sodium diet - metoprolol (LOPRESSOR) 50 MG tablet; Take 1 tablet (50 mg total) by mouth 2 (two) times daily.  Dispense: 60 tablet; Refill: 3 - amLODipine (NORVASC) 10 MG tablet; Take 1 tablet (10 mg total) by mouth daily.  Dispense: 30 tablet; Refill: 3  2. Acute deep vein thrombosis (DVT) of other specified vein of left lower extremity (HCC)/ B/L DVT Asymptomatic Currently on anticoagulation with Eliquis  3. PE (pulmonary thromboembolism) (HCC) S/p IVC filter Asymptomatic Reduced PE burden from most recent CT Hypercoagulable panel in 04/2016 after 6 months of anticoagulation  4. Paranoid schizophrenia, chronic condition ([place]) Previously followed by mental health at [facility] Strongly encouraged to resume care with mental health  5. Other emphysema ([place]) Stable Continue Spiriva Strongly encouraged to quit smoking

## 2016-12-22 ENCOUNTER — Other Ambulatory Visit: Payer: Self-pay | Admitting: Family Medicine

## 2017-01-17 ENCOUNTER — Ambulatory Visit: Payer: Medicare Other | Attending: Family Medicine | Admitting: Family Medicine

## 2017-01-17 ENCOUNTER — Encounter: Payer: Self-pay | Admitting: Family Medicine

## 2017-01-17 ENCOUNTER — Other Ambulatory Visit: Payer: Self-pay

## 2017-01-17 ENCOUNTER — Ambulatory Visit (HOSPITAL_COMMUNITY)
Admission: RE | Admit: 2017-01-17 | Discharge: 2017-01-17 | Disposition: A | Discharge status: Home / Self Care | Payer: Medicare Other | Source: Ambulatory Visit | Attending: Family Medicine | Admitting: Family Medicine

## 2017-01-17 VITALS — BP 114/67 | HR 86 | Temp 97.9°F | Ht 70.0 in | Wt 138.8 lb

## 2017-01-17 DIAGNOSIS — R079 Chest pain, unspecified: Secondary | ICD-10-CM | POA: Diagnosis not present

## 2017-01-17 DIAGNOSIS — I10 Essential (primary) hypertension: Secondary | ICD-10-CM | POA: Insufficient documentation

## 2017-01-17 DIAGNOSIS — I2699 Other pulmonary embolism without acute cor pulmonale: Secondary | ICD-10-CM

## 2017-01-17 DIAGNOSIS — R748 Abnormal levels of other serum enzymes: Secondary | ICD-10-CM | POA: Diagnosis not present

## 2017-01-17 DIAGNOSIS — B20 Human immunodeficiency virus [HIV] disease: Secondary | ICD-10-CM | POA: Diagnosis not present

## 2017-01-17 DIAGNOSIS — Z86711 Personal history of pulmonary embolism: Secondary | ICD-10-CM | POA: Diagnosis not present

## 2017-01-17 DIAGNOSIS — Z888 Allergy status to other drugs, medicaments and biological substances status: Secondary | ICD-10-CM | POA: Diagnosis not present

## 2017-01-17 DIAGNOSIS — F329 Major depressive disorder, single episode, unspecified: Secondary | ICD-10-CM | POA: Diagnosis not present

## 2017-01-17 DIAGNOSIS — I82492 Acute embolism and thrombosis of other specified deep vein of left lower extremity: Secondary | ICD-10-CM | POA: Insufficient documentation

## 2017-01-17 DIAGNOSIS — F200 Paranoid schizophrenia: Secondary | ICD-10-CM | POA: Insufficient documentation

## 2017-01-17 DIAGNOSIS — J439 Emphysema, unspecified: Secondary | ICD-10-CM | POA: Diagnosis not present

## 2017-01-17 DIAGNOSIS — F209 Schizophrenia, unspecified: Secondary | ICD-10-CM | POA: Insufficient documentation

## 2017-01-17 DIAGNOSIS — Z9889 Other specified postprocedural states: Secondary | ICD-10-CM | POA: Diagnosis not present

## 2017-01-17 DIAGNOSIS — I82433 Acute embolism and thrombosis of popliteal vein, bilateral: Secondary | ICD-10-CM

## 2017-01-17 DIAGNOSIS — R0789 Other chest pain: Secondary | ICD-10-CM | POA: Diagnosis not present

## 2017-01-17 DIAGNOSIS — J438 Other emphysema: Secondary | ICD-10-CM | POA: Diagnosis not present

## 2017-01-17 DIAGNOSIS — Z880 Allergy status to penicillin: Secondary | ICD-10-CM | POA: Insufficient documentation

## 2017-01-17 MED ORDER — AMLODIPINE BESYLATE 10 MG PO TABS: 10.0000 mg | ORAL_TABLET | Freq: Every day | ORAL | 3 refills | Status: DC

## 2017-01-17 MED ORDER — METOPROLOL TARTRATE 50 MG PO TABS: 50.0000 mg | ORAL_TABLET | Freq: Two times a day (BID) | ORAL | 3 refills | Status: DC

## 2017-01-17 NOTE — Progress Notes (Signed)
Subjective:    Patient ID: [name] Herring, male    DOB: 18-Sep-1961, 56 y.o.   MRN: [id number]  HPI Is a 56 year old male with a history of hypertension, HIV (CD4 count of 470, viral load of less than 20 currently on antiretroviral therapy), bilateral submassive pulmonary embolism, acute DVT of branch of the R Gastrocnemius vein, left lower extremity acute mobile thrombus of the proximal popliteal vein,acute DVT of the  distal to mid posterior tibial vein with right heart strain. During his hospitalization he underwent placement of IVC filter on 11/04/16 after which he was maintained on heparin which was later transitioned to Eliquis. 2-D echo revealed EF of 50-55%, no regional wall motion abnormalities, grade 1 diastolic dysfunction.  He presents today complaining of a two-week history of chest pains present most of the time at rest rated at an 8/10. Pain is located in the left anterior chest wall or radiates to the right; endorses shortness of breath but no wheezing.  He was seen at the ED last month for chest pains as well and CT Angio of the chest revealed reduced PE burden compared to previous imaging.  He has been compliant with his elevated lipase and other medications.   Past Medical History:  Diagnosis Date  . Depression   . HIV (human immunodeficiency virus infection) ([place])   . Hypertension   . Immune deficiency disorder ([place])   . PE (pulmonary thromboembolism) ([place]) 2018  . Personality disorder   . Schizophrenia [facility])     Past Surgical History:  Procedure Laterality Date  . IR GENERIC HISTORICAL  11/04/2016   IR IVC FILTER PLMT / S&I Burke Keels GUID/MOD SED 11/04/2016 Aletta Edouard, MD [facility]-INTERV RAD    Allergies  Allergen Reactions  . Amoxicillin Shortness Of Breath and Rash    Has patient had a PCN reaction causing immediate rash, facial/tongue/throat swelling, [name] Has patient had a PCN reaction causing severe rash involving mucus  membranes or skin necrosis: no Has patient had a PCN reaction that required hospitalization: yes drs office visit Has patient had a PCN reaction occurring within the last 10 years: no If all of the above answers are "NO", then may proceed with Cephalosporin use.   . Latex Shortness Of Breath     Review of Systems  Constitutional: Negative for activity change and appetite change.  HENT: Negative for sinus pressure and sore throat.   Eyes: Negative for visual disturbance.  Respiratory: Negative for cough, chest tightness and shortness of breath.   Cardiovascular: Negative for chest pain and leg swelling.  Gastrointestinal: Negative for abdominal distention, abdominal pain, constipation and diarrhea.  Endocrine: Negative.   Genitourinary: Negative for dysuria.  Musculoskeletal: Negative for joint swelling and myalgias.  Skin: Negative for rash.  Allergic/Immunologic: Negative.   Neurological: Negative for weakness, light-headedness and numbness.  Psychiatric/Behavioral: Negative for dysphoric mood and suicidal ideas.       Objective: Vitals:   01/17/17 1408  BP: 114/67  Pulse: 86  Temp: 97.9 F (36.6 C)  TempSrc: Oral  SpO2: 96%  Weight: 138 lb 12.8 oz (63 kg)  Height: 5\' 10"  (1.778 m)      Physical Exam  Constitutional: He is oriented to person, place, and time. He appears well-developed and well-nourished.  Neck: No JVD present.  Cardiovascular: Normal rate, normal heart sounds and intact distal pulses.   No murmur heard. Pulmonary/Chest: Effort normal and breath sounds normal. He has no wheezes. He has no rales.  He exhibits no tenderness.  Abdominal: Soft. Bowel sounds are normal. He exhibits no distension and no mass. There is no tenderness.  Musculoskeletal: Normal range of motion.  Neurological: He is alert and oriented to person, place, and time.  Skin: Skin is warm and dry.  Psychiatric: He has a normal mood and affect.          Assessment & Plan:  1.  Essential hypertension Controlled Low-sodium diet - metoprolol (LOPRESSOR) 50 MG tablet; Take 1 tablet (50 mg total) by mouth 2 (two) times daily.  Dispense: 60 tablet; Refill: 3 - amLODipine (NORVASC) 10 MG tablet; Take 1 tablet (10 mg total) by mouth daily.  Dispense: 30 tablet; Refill: 3  2. Acute deep vein thrombosis (DVT) of other specified vein of left lower extremity (HCC)/ B/L DVT Asymptomatic Currently on anticoagulation with Eliquis  3. PE (pulmonary thromboembolism) (HCC) S/p IVC filter Asymptomatic Reduced PE burden from most recent CT Hypercoagulable panel in 04/2016 after 6 months of anticoagulation  4. Paranoid schizophrenia, chronic condition ([place]) Previously followed by mental health at [facility] Strongly encouraged to resume care with mental health  5. Other emphysema ([place]) Stable Continue Spiriva Strongly encouraged to quit smoking  6. Chest Pain Normal EKG Atypical

## 2017-02-01 ENCOUNTER — Other Ambulatory Visit: Payer: Self-pay | Admitting: Family Medicine

## 2017-02-06 DIAGNOSIS — Z5181 Encounter for therapeutic drug level monitoring: Secondary | ICD-10-CM | POA: Diagnosis not present

## 2017-02-06 DIAGNOSIS — Z131 Encounter for screening for diabetes mellitus: Secondary | ICD-10-CM | POA: Diagnosis not present

## 2017-02-06 DIAGNOSIS — Z0000 Encounter for general adult medical examination without abnormal findings: Secondary | ICD-10-CM | POA: Diagnosis not present

## 2017-02-06 DIAGNOSIS — D696 Thrombocytopenia, unspecified: Secondary | ICD-10-CM | POA: Diagnosis not present

## 2017-02-06 DIAGNOSIS — Z720 Tobacco use: Secondary | ICD-10-CM | POA: Diagnosis not present

## 2017-02-06 DIAGNOSIS — R740 Nonspecific elevation of levels of transaminase and lactic acid dehydrogenase [LDH]: Secondary | ICD-10-CM | POA: Diagnosis not present

## 2017-02-06 DIAGNOSIS — Z86718 Personal history of other venous thrombosis and embolism: Secondary | ICD-10-CM | POA: Diagnosis not present

## 2017-02-06 DIAGNOSIS — I10 Essential (primary) hypertension: Secondary | ICD-10-CM | POA: Diagnosis not present

## 2017-02-06 DIAGNOSIS — D539 Nutritional anemia, unspecified: Secondary | ICD-10-CM | POA: Diagnosis not present

## 2017-02-06 DIAGNOSIS — B20 Human immunodeficiency virus [HIV] disease: Secondary | ICD-10-CM | POA: Diagnosis not present

## 2017-02-06 DIAGNOSIS — Z86711 Personal history of pulmonary embolism: Secondary | ICD-10-CM | POA: Diagnosis not present

## 2017-02-06 DIAGNOSIS — H538 Other visual disturbances: Secondary | ICD-10-CM | POA: Diagnosis not present

## 2017-02-06 DIAGNOSIS — J449 Chronic obstructive pulmonary disease, unspecified: Secondary | ICD-10-CM | POA: Diagnosis not present

## 2017-02-07 ENCOUNTER — Telehealth: Payer: Self-pay | Admitting: Family Medicine

## 2017-02-07 MED ORDER — APIXABAN 2.5 MG PO TABS: 2.5000 mg | ORAL_TABLET | Freq: Two times a day (BID) | ORAL | 2 refills | Status: DC

## 2017-02-07 NOTE — Telephone Encounter (Signed)
Eliquis refilled.  

## 2017-02-07 NOTE — Telephone Encounter (Signed)
Pt called requesting blood thinner medication, please f/up

## 2017-03-20 ENCOUNTER — Encounter: Payer: Self-pay | Admitting: Family Medicine

## 2017-03-20 ENCOUNTER — Ambulatory Visit: Payer: Medicare Other | Attending: Family Medicine | Admitting: Family Medicine

## 2017-03-20 VITALS — BP 104/68 | HR 55 | Temp 97.5°F | Wt 129.4 lb

## 2017-03-20 DIAGNOSIS — F200 Paranoid schizophrenia: Secondary | ICD-10-CM | POA: Insufficient documentation

## 2017-03-20 DIAGNOSIS — Z7901 Long term (current) use of anticoagulants: Secondary | ICD-10-CM | POA: Diagnosis not present

## 2017-03-20 DIAGNOSIS — B20 Human immunodeficiency virus [HIV] disease: Secondary | ICD-10-CM | POA: Insufficient documentation

## 2017-03-20 DIAGNOSIS — Z86711 Personal history of pulmonary embolism: Secondary | ICD-10-CM | POA: Diagnosis not present

## 2017-03-20 DIAGNOSIS — I2699 Other pulmonary embolism without acute cor pulmonale: Secondary | ICD-10-CM

## 2017-03-20 DIAGNOSIS — I10 Essential (primary) hypertension: Secondary | ICD-10-CM

## 2017-03-20 DIAGNOSIS — K5909 Other constipation: Secondary | ICD-10-CM | POA: Insufficient documentation

## 2017-03-20 DIAGNOSIS — M546 Pain in thoracic spine: Secondary | ICD-10-CM | POA: Diagnosis not present

## 2017-03-20 MED ORDER — POLYETHYLENE GLYCOL 3350 17 GM/SCOOP PO POWD: 17.0000 g | Freq: Every day | ORAL | 1 refills | Status: DC

## 2017-03-20 MED ORDER — METHOCARBAMOL 750 MG PO TABS: 750.0000 mg | ORAL_TABLET | Freq: Three times a day (TID) | ORAL | 1 refills | Status: DC | PRN

## 2017-03-20 MED ORDER — LISINOPRIL 10 MG PO TABS: 10.0000 mg | ORAL_TABLET | Freq: Every day | ORAL | 6 refills | Status: DC

## 2017-03-20 MED ORDER — METOPROLOL TARTRATE 25 MG PO TABS: 25.0000 mg | ORAL_TABLET | Freq: Two times a day (BID) | ORAL | 6 refills | Status: DC

## 2017-03-20 NOTE — Progress Notes (Signed)
Subjective:  Patient ID: [name] Herring, male    DOB: [birth year]  Age: 56 y.o. MRN: [id number]  CC: Follow-up   HPI [name] Herring Is a 56 year old male with a history of hypertension, HIV (CD4 count of 470, viral load of less than 20 currently on antiretroviral therapy), bilateral submassive pulmonary embolism, acute DVT of branch of the R Gastrocnemius vein, left lower extremity acute mobile thrombus of the proximal popliteal vein,acute DVT of the  distal to mid posterior tibial vein with right heart strain, placement of IVC filter in 10/2016.  2-D echo from 10/2016 revealed EF of 50-55%, no regional wall motion abnormalities, grade 1 diastolic dysfunction.  He presents today complaining of constipation and abdominal pain for the last 5 days and denies nausea or vomiting.  He has also had low back pain for the last 2 weeks which does not radiate to his lower extremities. He denies numbness in his legs and denies loss of sphincteric function. He has not been doing any heavy lifting.  He has been compliant with Eliquis and denies cough pain, shortness of breath or chest pains.  Mental health care is received at [facility].  Past Medical History:  Diagnosis Date  . Depression   . HIV (human immunodeficiency virus infection) ([place])   . Hypertension   . Immune deficiency disorder ([place])   . PE (pulmonary thromboembolism) ([place]) 2018  . Personality disorder   . Schizophrenia [facility])     Past Surgical History:  Procedure Laterality Date  . IR GENERIC HISTORICAL  11/04/2016   IR IVC FILTER PLMT / S&I Burke Keels GUID/MOD SED 11/04/2016 Aletta Edouard, MD [facility]-INTERV RAD    Allergies  Allergen Reactions  . Amoxicillin Shortness Of Breath and Rash    Has patient had a PCN reaction causing immediate rash, facial/tongue/throat swelling, [name] Has patient had a PCN reaction causing severe rash involving mucus membranes or skin necrosis: no Has patient had a PCN  reaction that required hospitalization: yes drs office visit Has patient had a PCN reaction occurring within the last 10 years: no If all of the above answers are "NO", then may proceed with Cephalosporin use.   . Latex Shortness Of Breath     Outpatient Medications Prior to Visit  Medication Sig Dispense Refill  . apixaban (ELIQUIS) 2.5 MG TABS tablet Take 1 tablet (2.5 mg total) by mouth 2 (two) times daily. 60 tablet 2  . benztropine (COGENTIN) 1 MG tablet 1 tab in the morning 2 tabs at night 14 tablet 0  . elvitegravir-cobicistat-emtricitabine-tenofovir (GENVOYA) 150-150-200-10 MG TABS tablet TAKE 1 TABLET BY MOUTH DAILY WITH BREAKFAST. *CALL [phone] to see Dr Johnnye Sima ASAP* 30 tablet 1  . gabapentin (NEURONTIN) 600 MG tablet Take 600 mg by mouth at bedtime.    . haloperidol (HALDOL) 10 MG tablet Take 10 mg by mouth 2 (two) times daily.    Marland Kitchen ibuprofen (ADVIL,MOTRIN) 200 MG tablet Take 400 mg by mouth every 6 (six) hours as needed for moderate pain.     . magnesium oxide (MAG-OX) 400 (241.3 Mg) MG tablet Take 1 tablet (400 mg total) by mouth 2 (two) times daily. 60 tablet 0  . mirtazapine (REMERON) 15 MG tablet Take 15 mg by mouth at bedtime.    . potassium chloride SA (K-DUR,KLOR-CON) 20 MEQ tablet TAKE ONE TABLET BY MOUTH THREE TIMES DAILY 30 tablet 0  . tiotropium (SPIRIVA) 18 MCG inhalation capsule Place 1 capsule (18 mcg total) into inhaler and  inhale daily. 30 capsule 6  . Valbenazine Tosylate (INGREZZA) 80 MG CAPS Take 80 mg by mouth 2 (two) times daily.     Marland Kitchen amLODipine (NORVASC) 10 MG tablet Take 1 tablet (10 mg total) by mouth daily. 30 tablet 3  . metoprolol (LOPRESSOR) 50 MG tablet Take 1 tablet (50 mg total) by mouth 2 (two) times daily. 60 tablet 3  . acetaminophen (TYLENOL) 500 MG tablet Take 1 tablet (500 mg total) by mouth every 6 (six) hours as needed for moderate pain. (Patient not taking: Reported on 01/17/2017) 30 tablet 0  . Multiple Vitamin (MULTIVITAMIN WITH  MINERALS) TABS tablet Take 1 tablet by mouth daily. (Patient not taking: Reported on 03/20/2017)    . thiamine 100 MG tablet Take 1 tablet (100 mg total) by mouth daily. (Patient not taking: Reported on 01/17/2017)     No facility-administered medications prior to visit.     ROS Review of Systems  Constitutional: Negative for activity change and appetite change.  HENT: Negative for sinus pressure and sore throat.   Eyes: Negative for visual disturbance.  Respiratory: Negative for cough, chest tightness and shortness of breath.   Cardiovascular: Negative for chest pain and leg swelling.  Gastrointestinal: Negative for abdominal distention, abdominal pain, constipation and diarrhea.  Endocrine: Negative.   Genitourinary: Negative for dysuria.  Musculoskeletal: Positive for back pain. Negative for joint swelling and myalgias.  Skin: Negative for rash.  Allergic/Immunologic: Negative.   Neurological: Negative for weakness, light-headedness and numbness.  Psychiatric/Behavioral: Negative for dysphoric mood and suicidal ideas.    Objective:  BP 104/68   Pulse (!) 55   Temp 97.5 F (36.4 C) (Oral)   Wt 129 lb 6.4 oz (58.7 kg)   SpO2 99%   BMI 18.57 kg/m   BP/Weight 03/20/2017 01/17/2017 5/36/6440  Systolic BP 347 425 956  Diastolic BP 68 67 71  Wt. (Lbs) 129.4 138.8 140  BMI 18.57 19.92 20.09  Some encounter information is confidential and restricted. Go to Review Flowsheets activity to see all data.      Physical Exam  Constitutional: He is oriented to person, place, and time. He appears well-developed and well-nourished.  Cardiovascular: Normal rate, normal heart sounds and intact distal pulses.   No murmur heard. Pulmonary/Chest: Effort normal and breath sounds normal. He has no wheezes. He has no rales. He exhibits no tenderness.  Abdominal: Soft. Bowel sounds are normal. He exhibits no distension and no mass. There is no tenderness.  Musculoskeletal: Normal range of motion.  He exhibits edema (2+ nonpitting bilateral dorsal edema). He exhibits no tenderness (No tenderness on palpation of entire spine).  Tardive dyskinesia  Neurological: He is alert and oriented to person, place, and time.  Skin: Skin is warm and dry.  Psychiatric: He has a normal mood and affect.     Assessment & Plan:   1. Essential hypertension Blood pressure is on the soft side Discontinue amlodipine due to pedal edema and substituted lisinopril Reduced dose of metoprolol due to bradycardia Previously on potassium which I have not refilled as Lisinopril should correct for hypokalemia - metoprolol tartrate (LOPRESSOR) 25 MG tablet; Take 1 tablet (25 mg total) by mouth 2 (two) times daily.  Dispense: 60 tablet; Refill: 6 - Basic Metabolic Panel - lisinopril (PRINIVIL,ZESTRIL) 10 MG tablet; Take 1 tablet (10 mg total) by mouth daily.  Dispense: 30 tablet; Refill: 6  2. Paranoid schizophrenia, chronic condition ([place]) Continue Haldol, congestion 10 Keep appointment with mental health [facility]  3.  Human immunodeficiency virus (HIV) disease ([place]) Continue antiretroviral therapy Management as per infectious disease  4. PE (pulmonary thromboembolism) ([place]) Continue Eliquis Hypercoagulable panel 2 in 04/2017 at which time he would have completed 6 months of anticoagulation.  5. Other constipation Could explain abdominal pain Increase fiber intake, water - polyethylene glycol powder (GLYCOLAX/MIRALAX) powder; Take 17 g by mouth daily.  Dispense: 3350 g; Refill: 1  6. Acute thoracic back pain, unspecified back pain laterality - methocarbamol (ROBAXIN-750) 750 MG tablet; Take 1 tablet (750 mg total) by mouth every 8 (eight) hours as needed for muscle spasms.  Dispense: 90 tablet; Refill: 1   Meds ordered this encounter  Medications  . metoprolol tartrate (LOPRESSOR) 25 MG tablet    Sig: Take 1 tablet (25 mg total) by mouth 2 (two) times daily.    Dispense:  60 tablet    Refill:  6     Discontinue previous dose  . lisinopril (PRINIVIL,ZESTRIL) 10 MG tablet    Sig: Take 1 tablet (10 mg total) by mouth daily.    Dispense:  30 tablet    Refill:  6    Discontinue amlodipine  . polyethylene glycol powder (GLYCOLAX/MIRALAX) powder    Sig: Take 17 g by mouth daily.    Dispense:  3350 g    Refill:  1  . methocarbamol (ROBAXIN-750) 750 MG tablet    Sig: Take 1 tablet (750 mg total) by mouth every 8 (eight) hours as needed for muscle spasms.    Dispense:  90 tablet    Refill:  1    Follow-up: Return in about 2 months (around 05/20/2017) for Follow-up on pulmonary embolism and Hypercoagulable panel.   This note has been created with Surveyor, quantity. Any transcriptional errors are unintentional.     Arnoldo Morale MD

## 2017-03-20 NOTE — Patient Instructions (Signed)

## 2017-03-21 LAB — BASIC METABOLIC PANEL
BUN / CREAT RATIO: 17 (ref 9–20)
BUN: 18 mg/dL (ref 6–24)
CHLORIDE: 102 mmol/L (ref 96–106)
CO2: 22 mmol/L (ref 20–29)
Calcium: 9.9 mg/dL (ref 8.7–10.2)
Creatinine, Ser: 1.04 mg/dL (ref 0.76–1.27)
GFR calc Af Amer: 93 mL/min/{1.73_m2} (ref >59)
GFR calc non Af Amer: 80 mL/min/{1.73_m2} (ref >59)
GLUCOSE: 92 mg/dL (ref 65–99)
Potassium: 4.6 mmol/L (ref 3.5–5.2)
SODIUM: 139 mmol/L (ref 134–144)

## 2017-03-22 ENCOUNTER — Emergency Department (HOSPITAL_COMMUNITY)
Admission: EM | Admit: 2017-03-22 | Discharge: 2017-03-22 | Disposition: A | Discharge status: Home / Self Care | Payer: Medicare Other | Attending: Emergency Medicine | Admitting: Emergency Medicine

## 2017-03-22 DIAGNOSIS — R112 Nausea with vomiting, unspecified: Secondary | ICD-10-CM | POA: Insufficient documentation

## 2017-03-22 DIAGNOSIS — R1110 Vomiting, unspecified: Secondary | ICD-10-CM | POA: Diagnosis not present

## 2017-03-22 DIAGNOSIS — Z79899 Other long term (current) drug therapy: Secondary | ICD-10-CM | POA: Diagnosis not present

## 2017-03-22 DIAGNOSIS — R100 Acute abdomen: Secondary | ICD-10-CM | POA: Diagnosis not present

## 2017-03-22 DIAGNOSIS — I10 Essential (primary) hypertension: Secondary | ICD-10-CM | POA: Insufficient documentation

## 2017-03-22 DIAGNOSIS — Z7901 Long term (current) use of anticoagulants: Secondary | ICD-10-CM | POA: Diagnosis not present

## 2017-03-22 DIAGNOSIS — K5900 Constipation, unspecified: Secondary | ICD-10-CM | POA: Diagnosis not present

## 2017-03-22 DIAGNOSIS — Z91040 Latex allergy status: Secondary | ICD-10-CM | POA: Diagnosis not present

## 2017-03-22 DIAGNOSIS — E039 Hypothyroidism, unspecified: Secondary | ICD-10-CM | POA: Diagnosis not present

## 2017-03-22 DIAGNOSIS — F17210 Nicotine dependence, cigarettes, uncomplicated: Secondary | ICD-10-CM | POA: Diagnosis not present

## 2017-03-22 LAB — CBC WITH DIFFERENTIAL/PLATELET
BASOS PCT: 0 %
Basophils Absolute: 0 10*3/uL (ref 0.0–0.1)
EOS ABS: 0.1 10*3/uL (ref 0.0–0.7)
EOS PCT: 1 %
HCT: 34.6 % — ABNORMAL LOW (ref 39.0–52.0)
Hemoglobin: 11.4 g/dL — ABNORMAL LOW (ref 13.0–17.0)
Lymphocytes Relative: 29 %
Lymphs Abs: 1.5 10*3/uL (ref 0.7–4.0)
MCH: 29.8 pg (ref 26.0–34.0)
MCHC: 32.9 g/dL (ref 30.0–36.0)
MCV: 90.3 fL (ref 78.0–100.0)
MONO ABS: 0.5 10*3/uL (ref 0.1–1.0)
Monocytes Relative: 10 %
NEUTROS ABS: 3.1 10*3/uL (ref 1.7–7.7)
Neutrophils Relative %: 60 %
PLATELETS: 197 10*3/uL (ref 150–400)
RBC: 3.83 MIL/uL — ABNORMAL LOW (ref 4.22–5.81)
RDW: 20.8 % — AB (ref 11.5–15.5)
WBC: 5.2 10*3/uL (ref 4.0–10.5)

## 2017-03-22 LAB — COMPREHENSIVE METABOLIC PANEL
ALBUMIN: 3.4 g/dL — AB (ref 3.5–5.0)
ALT: 8 U/L — ABNORMAL LOW (ref 17–63)
ANION GAP: 8 (ref 5–15)
AST: 13 U/L — AB (ref 15–41)
Alkaline Phosphatase: 46 U/L (ref 38–126)
BUN: 18 mg/dL (ref 6–20)
CHLORIDE: 111 mmol/L (ref 101–111)
CO2: 23 mmol/L (ref 22–32)
Calcium: 9.3 mg/dL (ref 8.9–10.3)
Creatinine, Ser: 0.81 mg/dL (ref 0.61–1.24)
GFR calc Af Amer: >60 mL/min (ref >60)
GFR calc non Af Amer: >60 mL/min (ref >60)
GLUCOSE: 90 mg/dL (ref 65–99)
POTASSIUM: 4.1 mmol/L (ref 3.5–5.1)
SODIUM: 142 mmol/L (ref 135–145)
Total Bilirubin: 0.3 mg/dL (ref 0.3–1.2)
Total Protein: 6.5 g/dL (ref 6.5–8.1)

## 2017-03-22 LAB — URINALYSIS, ROUTINE W REFLEX MICROSCOPIC
Bilirubin Urine: NEGATIVE
Glucose, UA: NEGATIVE mg/dL
HGB URINE DIPSTICK: NEGATIVE
KETONES UR: NEGATIVE mg/dL
Leukocytes, UA: NEGATIVE
Nitrite: NEGATIVE
PROTEIN: NEGATIVE mg/dL
Specific Gravity, Urine: 1.012 (ref 1.005–1.030)
pH: 7 (ref 5.0–8.0)

## 2017-03-22 LAB — LIPASE, BLOOD: LIPASE: 25 U/L (ref 11–51)

## 2017-03-22 MED ORDER — ONDANSETRON HCL 4 MG/2ML IJ SOLN
4.0000 mg | Freq: Once | INTRAMUSCULAR | Status: AC
  Administered 2017-03-22: 4 mg via INTRAVENOUS
  Filled 2017-03-22: qty 2

## 2017-03-22 MED ORDER — SODIUM CHLORIDE 0.9 % IV BOLUS (SEPSIS)
1000.0000 mL | Freq: Once | INTRAVENOUS | Status: AC
  Administered 2017-03-22: 1000 mL via INTRAVENOUS

## 2017-03-22 MED ORDER — MORPHINE SULFATE (PF) 4 MG/ML IV SOLN
4.0000 mg | Freq: Once | INTRAVENOUS | Status: AC
  Administered 2017-03-22: 4 mg via INTRAVENOUS
  Filled 2017-03-22: qty 1

## 2017-03-22 MED ORDER — ONDANSETRON HCL 4 MG PO TABS: 4.0000 mg | ORAL_TABLET | Freq: Four times a day (QID) | ORAL | 0 refills | Status: DC

## 2017-03-22 NOTE — ED Provider Notes (Signed)
[place] DEPT Provider Note   CSN: [id number] Arrival date & time: 03/22/17  0105     History   Chief Complaint Chief Complaint  Patient presents with  . Emesis    HPI [name] Herring is a 56 y.o. male.  Patient presents emergency department with chief complaint of nausea and vomiting. He states that he has been vomiting for the past 2 weeks. He states that he feels fatigued and dehydrated. He denies any associated fever, chills, cough, chest pain, shortness of breath, diarrhea, or constipation. He states that he has some crampy upper abdominal pain. He states that he recently quit drinking alcohol about 2 months ago. He denies seeing any blood in his vomit. He denies any other associated symptoms. There are no modifying factors. He states that he has been compliant with his HIV meds, and states that his last CD4 count was about 400. Per chart review, his last CD4 count was 290, and viral load was 152.   The history is provided by the patient. No language interpreter was used.    Past Medical History:  Diagnosis Date  . Depression   . HIV (human immunodeficiency virus infection) ([place])   . Hypertension   . Immune deficiency disorder ([place])   . PE (pulmonary thromboembolism) ([place]) 2018  . Personality disorder   . Schizophrenia [facility])     Patient Active Problem List   Diagnosis Date Noted  . Emphysema lung ([place]) 11/20/2016  . Deep vein thrombosis (DVT) of left lower extremity ([place])   . DVT of lower extremity, bilateral ([place]) 11/04/2016  . Tobacco abuse 11/02/2016  . PE (pulmonary thromboembolism) ([place]) 11/02/2016  . Acute respiratory failure with hypoxia ([place]) 07/17/2016  . CAP (community acquired pneumonia) 07/16/2016  . Cocaine dependence with cocaine-induced mood disorder ([place]) 03/29/2016  . HTN (hypertension) 08/25/2015  . Paranoid schizophrenia, chronic condition ([place]) 12/30/2014  . Cocaine use disorder, severe, dependence ([place]) 12/27/2014  . Alcohol use  disorder, severe, dependence ([place]) 12/27/2014  . ERECTILE DYSFUNCTION 09/03/2007  . FOOT PAIN, BILATERAL 07/10/2007  . Human immunodeficiency virus (HIV) disease ([place]) 11/09/2006  . VIRAL MENINGITIS 11/09/2006  . THRUSH 11/09/2006  . CA IN SITU, RECTUM 11/09/2006  . HYPOTHYROIDISM 11/09/2006  . DISORDER, BIPOLAR NOS 11/09/2006  . PYELONEPHRITIS 11/09/2006    Past Surgical History:  Procedure Laterality Date  . IR GENERIC HISTORICAL  11/04/2016   IR IVC FILTER PLMT / S&I Burke Keels GUID/MOD SED 11/04/2016 Aletta Edouard, MD [facility]-INTERV RAD       Home Medications    Prior to Admission medications   Medication Sig Start Date End Date Taking? Authorizing Provider  acetaminophen (TYLENOL) 500 MG tablet Take 1 tablet (500 mg total) by mouth every 6 (six) hours as needed for moderate pain. Patient not taking: Reported on 01/17/2017 11/07/16   Eugenie Filler, MD  apixaban (ELIQUIS) 2.5 MG TABS tablet Take 1 tablet (2.5 mg total) by mouth 2 (two) times daily. 02/07/17   Arnoldo Morale, MD  benztropine (COGENTIN) 1 MG tablet 1 tab in the morning 2 tabs at night 11/10/16   McClung, Angela M, PA-C  elvitegravir-cobicistat-emtricitabine-tenofovir (GENVOYA) 150-150-200-10 MG TABS tablet TAKE 1 TABLET BY MOUTH DAILY WITH BREAKFAST. Christena Flake [phone] to see Dr Johnnye Sima ASAP* 07/17/16   Janece Canterbury, MD  gabapentin (NEURONTIN) 600 MG tablet Take 600 mg by mouth at bedtime.    [provider]  haloperidol (HALDOL) 10 MG tablet Take 10 mg by mouth 2 (two) times daily.    [provider]  ibuprofen (ADVIL,MOTRIN) 200 MG tablet Take 400 mg by mouth every 6 (six) hours as needed for moderate pain.     [provider]  lisinopril (PRINIVIL,ZESTRIL) 10 MG tablet Take 1 tablet (10 mg total) by mouth daily. 03/20/17   Arnoldo Morale, MD  magnesium oxide (MAG-OX) 400 (241.3 Mg) MG tablet Take 1 tablet (400 mg total) by mouth 2 (two) times daily. 11/07/16   Eugenie Filler, MD    methocarbamol (ROBAXIN-750) 750 MG tablet Take 1 tablet (750 mg total) by mouth every 8 (eight) hours as needed for muscle spasms. 03/20/17   Arnoldo Morale, MD  metoprolol tartrate (LOPRESSOR) 25 MG tablet Take 1 tablet (25 mg total) by mouth 2 (two) times daily. 03/20/17   Arnoldo Morale, MD  mirtazapine (REMERON) 15 MG tablet Take 15 mg by mouth at bedtime.    [provider]  Multiple Vitamin (MULTIVITAMIN WITH MINERALS) TABS tablet Take 1 tablet by mouth daily. Patient not taking: Reported on 03/20/2017 11/08/16   Eugenie Filler, MD  polyethylene glycol powder (GLYCOLAX/MIRALAX) powder Take 17 g by mouth daily. 03/20/17   Arnoldo Morale, MD  potassium chloride SA (K-DUR,KLOR-CON) 20 MEQ tablet TAKE ONE TABLET BY MOUTH THREE TIMES DAILY 02/01/17   Arnoldo Morale, MD  thiamine 100 MG tablet Take 1 tablet (100 mg total) by mouth daily. Patient not taking: Reported on 01/17/2017 11/08/16   Eugenie Filler, MD  tiotropium [facility]) 18 MCG inhalation capsule Place 1 capsule (18 mcg total) into inhaler and inhale daily. 11/08/16   Eugenie Filler, MD  Valbenazine Tosylate [facility]) 80 MG CAPS Take 80 mg by mouth 2 (two) times daily.     [provider]    Family History Family History  Problem Relation Age of Onset  . CAD Mother   . Kidney disease Sister   . Lupus Brother   . Cancer Maternal Grandmother   . Stroke Paternal Grandmother     Social History Social History  Substance Use Topics  . Smoking status: Current Every Day Smoker    Packs/day: 1.00    Years: 1.00    Types: Cigarettes  . Smokeless tobacco: Never Used  . Alcohol use No     Comment: 8 months ago     Allergies   Amoxicillin and Latex   Review of Systems Review of Systems  All other systems reviewed and are negative.    Physical Exam Updated Vital Signs BP 117/86 (BP Location: Right Arm)   Pulse 84   Temp 98.2 F (36.8 C) (Oral)   Resp 20   Ht 5\' 2"  (1.575 m)   Wt 58.5 kg (129 lb)    SpO2 100%   BMI 23.59 kg/m   Physical Exam  Constitutional: He is oriented to person, place, and time. He appears well-developed and well-nourished.  HENT:  Head: Normocephalic and atraumatic.  Eyes: Conjunctivae and EOM are normal. Pupils are equal, round, and reactive to light. Right eye exhibits no discharge. Left eye exhibits no discharge. No scleral icterus.  Neck: Normal range of motion. Neck supple. No JVD present.  Cardiovascular: Normal rate, regular rhythm and normal heart sounds.  Exam reveals no gallop and no friction rub.   No murmur heard. Pulmonary/Chest: Effort normal and breath sounds normal. No respiratory distress. He has no wheezes. He has no rales. He exhibits no tenderness.  Abdominal: Soft. He exhibits no distension and no mass. There is no tenderness. There is no rebound and no  guarding.  Musculoskeletal: Normal range of motion. He exhibits no edema or tenderness.  Neurological: He is alert and oriented to person, place, and time.  Skin: Skin is warm and dry.  Psychiatric: He has a normal mood and affect. His behavior is normal. Judgment and thought content normal.  Nursing note and vitals reviewed.    ED Treatments / Results  Labs (all labs ordered are listed, but only abnormal results are displayed) Labs Reviewed  CBC WITH DIFFERENTIAL/PLATELET  COMPREHENSIVE METABOLIC PANEL  LIPASE, BLOOD  URINALYSIS, ROUTINE W REFLEX MICROSCOPIC    EKG  EKG Interpretation None       Radiology No results found.  Procedures Procedures (including critical care time)  Medications Ordered in ED Medications  sodium chloride 0.9 % bolus 1,000 mL (not administered)  morphine 4 MG/ML injection 4 mg (not administered)  ondansetron (ZOFRAN) injection 4 mg (not administered)     Initial Impression / Assessment and Plan / ED Course  I have reviewed the triage vital signs and the nursing notes.  Pertinent labs & imaging results that were available during my care  of the patient were reviewed by me and considered in my medical decision making (see chart for details).     Patient with nausea and vomiting 2 weeks. Will check labs, give fluids, treat symptoms, and will reassess. Patient does not have any evidence of acute or surgical abdomen. Anticipate symptomatic therapy and discharge with outpatient follow-up.  Laboratory workup is reassuring. Patient has not vomited at all in the emergency department. Patient feels improved after fluid. Recommend PCP follow-up.  Final Clinical Impressions(s) / ED Diagnoses   Final diagnoses:  Nausea and vomiting, intractability of vomiting not specified, unspecified vomiting type    New Prescriptions New Prescriptions   ONDANSETRON (ZOFRAN) 4 MG TABLET    Take 1 tablet (4 mg total) by mouth every 6 (six) hours.     Montine Circle, PA-C 03/22/17 [place], MD 03/22/17 [phone]

## 2017-03-22 NOTE — ED Notes (Signed)
Bed: [facility]1 Expected date:  Expected time:  Means of arrival:  Comments: 

## 2017-03-22 NOTE — ED Triage Notes (Signed)
Pt reports that he has had greater than 10 emesis in the last 24 hours. Further states he is feeling very weak

## 2017-03-22 NOTE — ED Notes (Signed)
Patient is asleep and snoring. Assisted with ambulation to bathroom, he is somewhat shaky. When asked about drug or alcohol use he stated he hasn't drank in over a week and no drug use. Noticeable tremors with hands extended. Poor hygiene. Continues to fall asleep during assessment and slept during IV insertion and lab draw.

## 2017-03-29 ENCOUNTER — Telehealth: Payer: Self-pay

## 2017-03-29 NOTE — Telephone Encounter (Signed)
Pt was called and informed of lab results. 

## 2017-05-13 ENCOUNTER — Other Ambulatory Visit: Payer: Self-pay | Admitting: Family Medicine

## 2017-05-13 DIAGNOSIS — I10 Essential (primary) hypertension: Secondary | ICD-10-CM

## 2017-05-14 ENCOUNTER — Other Ambulatory Visit: Payer: Self-pay | Admitting: Family Medicine

## 2017-05-14 DIAGNOSIS — I10 Essential (primary) hypertension: Secondary | ICD-10-CM

## 2017-05-25 ENCOUNTER — Ambulatory Visit: Payer: Self-pay | Admitting: Family Medicine

## 2017-05-29 ENCOUNTER — Emergency Department (HOSPITAL_COMMUNITY): Payer: Medicare Other

## 2017-05-29 ENCOUNTER — Encounter (HOSPITAL_COMMUNITY): Payer: Self-pay

## 2017-05-29 ENCOUNTER — Inpatient Hospital Stay (HOSPITAL_COMMUNITY)
Admission: EM | Admit: 2017-05-29 | Discharge: 2017-05-31 | DRG: 177 | Disposition: A | Discharge status: Home / Self Care | Payer: Medicare Other | Attending: Internal Medicine | Admitting: Internal Medicine

## 2017-05-29 DIAGNOSIS — Z9119 Patient's noncompliance with other medical treatment and regimen: Secondary | ICD-10-CM | POA: Diagnosis not present

## 2017-05-29 DIAGNOSIS — Z91040 Latex allergy status: Secondary | ICD-10-CM | POA: Diagnosis not present

## 2017-05-29 DIAGNOSIS — J189 Pneumonia, unspecified organism: Secondary | ICD-10-CM | POA: Diagnosis present

## 2017-05-29 DIAGNOSIS — E872 Acidosis, unspecified: Secondary | ICD-10-CM

## 2017-05-29 DIAGNOSIS — Z823 Family history of stroke: Secondary | ICD-10-CM | POA: Diagnosis not present

## 2017-05-29 DIAGNOSIS — R740 Nonspecific elevation of levels of transaminase and lactic acid dehydrogenase [LDH]: Secondary | ICD-10-CM | POA: Diagnosis present

## 2017-05-29 DIAGNOSIS — Z880 Allergy status to penicillin: Secondary | ICD-10-CM | POA: Diagnosis not present

## 2017-05-29 DIAGNOSIS — Z8249 Family history of ischemic heart disease and other diseases of the circulatory system: Secondary | ICD-10-CM | POA: Diagnosis not present

## 2017-05-29 DIAGNOSIS — Z720 Tobacco use: Secondary | ICD-10-CM | POA: Diagnosis not present

## 2017-05-29 DIAGNOSIS — F17200 Nicotine dependence, unspecified, uncomplicated: Secondary | ICD-10-CM | POA: Diagnosis present

## 2017-05-29 DIAGNOSIS — I2699 Other pulmonary embolism without acute cor pulmonale: Secondary | ICD-10-CM | POA: Diagnosis not present

## 2017-05-29 DIAGNOSIS — Z79899 Other long term (current) drug therapy: Secondary | ICD-10-CM

## 2017-05-29 DIAGNOSIS — F1020 Alcohol dependence, uncomplicated: Secondary | ICD-10-CM | POA: Diagnosis present

## 2017-05-29 DIAGNOSIS — F1021 Alcohol dependence, in remission: Secondary | ICD-10-CM | POA: Diagnosis present

## 2017-05-29 DIAGNOSIS — J690 Pneumonitis due to inhalation of food and vomit: Principal | ICD-10-CM | POA: Diagnosis present

## 2017-05-29 DIAGNOSIS — I10 Essential (primary) hypertension: Secondary | ICD-10-CM | POA: Diagnosis present

## 2017-05-29 DIAGNOSIS — Z832 Family history of diseases of the blood and blood-forming organs and certain disorders involving the immune mechanism: Secondary | ICD-10-CM | POA: Diagnosis not present

## 2017-05-29 DIAGNOSIS — Z809 Family history of malignant neoplasm, unspecified: Secondary | ICD-10-CM

## 2017-05-29 DIAGNOSIS — J441 Chronic obstructive pulmonary disease with (acute) exacerbation: Secondary | ICD-10-CM | POA: Diagnosis not present

## 2017-05-29 DIAGNOSIS — E039 Hypothyroidism, unspecified: Secondary | ICD-10-CM | POA: Diagnosis present

## 2017-05-29 DIAGNOSIS — Z841 Family history of disorders of kidney and ureter: Secondary | ICD-10-CM | POA: Diagnosis not present

## 2017-05-29 DIAGNOSIS — R05 Cough: Secondary | ICD-10-CM | POA: Diagnosis not present

## 2017-05-29 DIAGNOSIS — R0602 Shortness of breath: Secondary | ICD-10-CM | POA: Diagnosis not present

## 2017-05-29 DIAGNOSIS — R069 Unspecified abnormalities of breathing: Secondary | ICD-10-CM | POA: Diagnosis not present

## 2017-05-29 DIAGNOSIS — F17210 Nicotine dependence, cigarettes, uncomplicated: Secondary | ICD-10-CM | POA: Diagnosis not present

## 2017-05-29 DIAGNOSIS — Z9889 Other specified postprocedural states: Secondary | ICD-10-CM

## 2017-05-29 DIAGNOSIS — R7989 Other specified abnormal findings of blood chemistry: Secondary | ICD-10-CM | POA: Diagnosis present

## 2017-05-29 DIAGNOSIS — B20 Human immunodeficiency virus [HIV] disease: Secondary | ICD-10-CM | POA: Diagnosis present

## 2017-05-29 LAB — CBC WITH DIFFERENTIAL/PLATELET
BASOS ABS: 0 10*3/uL (ref 0.0–0.1)
BASOS PCT: 0 %
EOS ABS: 0 10*3/uL (ref 0.0–0.7)
Eosinophils Relative: 0 %
HCT: 32.7 % — ABNORMAL LOW (ref 39.0–52.0)
HEMOGLOBIN: 11 g/dL — AB (ref 13.0–17.0)
Lymphocytes Relative: 27 %
Lymphs Abs: 1.8 10*3/uL (ref 0.7–4.0)
MCH: 29.4 pg (ref 26.0–34.0)
MCHC: 33.6 g/dL (ref 30.0–36.0)
MCV: 87.4 fL (ref 78.0–100.0)
MONOS PCT: 6 %
Monocytes Absolute: 0.4 10*3/uL (ref 0.1–1.0)
NEUTROS ABS: 4.5 10*3/uL (ref 1.7–7.7)
NEUTROS PCT: 67 %
Platelets: 154 10*3/uL (ref 150–400)
RBC: 3.74 MIL/uL — ABNORMAL LOW (ref 4.22–5.81)
RDW: 17.1 % — AB (ref 11.5–15.5)
WBC: 6.7 10*3/uL (ref 4.0–10.5)

## 2017-05-29 LAB — COMPREHENSIVE METABOLIC PANEL
ALBUMIN: 3.2 g/dL — AB (ref 3.5–5.0)
ALK PHOS: 55 U/L (ref 38–126)
ALT: 18 U/L (ref 17–63)
AST: 39 U/L (ref 15–41)
Anion gap: 17 — ABNORMAL HIGH (ref 5–15)
BUN: 14 mg/dL (ref 6–20)
CALCIUM: 9.5 mg/dL (ref 8.9–10.3)
CO2: 18 mmol/L — AB (ref 22–32)
Chloride: 104 mmol/L (ref 101–111)
Creatinine, Ser: 1.13 mg/dL (ref 0.61–1.24)
GFR calc Af Amer: >60 mL/min (ref >60)
GFR calc non Af Amer: >60 mL/min (ref >60)
Glucose, Bld: 82 mg/dL (ref 65–99)
Potassium: 3.3 mmol/L — ABNORMAL LOW (ref 3.5–5.1)
SODIUM: 139 mmol/L (ref 135–145)
Total Bilirubin: 0.9 mg/dL (ref 0.3–1.2)
Total Protein: 6.7 g/dL (ref 6.5–8.1)

## 2017-05-29 LAB — APTT: APTT: 29 s (ref 24–36)

## 2017-05-29 LAB — PROTIME-INR
INR: 1.03
Prothrombin Time: 13.4 seconds (ref 11.4–15.2)

## 2017-05-29 LAB — URINALYSIS, ROUTINE W REFLEX MICROSCOPIC
Bilirubin Urine: NEGATIVE
GLUCOSE, UA: NEGATIVE mg/dL
HGB URINE DIPSTICK: NEGATIVE
KETONES UR: NEGATIVE mg/dL
LEUKOCYTES UA: NEGATIVE
Nitrite: NEGATIVE
Protein, ur: NEGATIVE mg/dL
Specific Gravity, Urine: 1.006 (ref 1.005–1.030)
pH: 6 (ref 5.0–8.0)

## 2017-05-29 LAB — I-STAT CG4 LACTIC ACID, ED
Lactic Acid, Venous: 7.71 mmol/L (ref 0.5–1.9)
Lactic Acid, Venous: 7.61 mmol/L (ref 0.5–1.9)

## 2017-05-29 LAB — LACTIC ACID, PLASMA: LACTIC ACID, VENOUS: 7.8 mmol/L — AB (ref 0.5–1.9)

## 2017-05-29 LAB — PROCALCITONIN

## 2017-05-29 LAB — I-STAT TROPONIN, ED: TROPONIN I, POC: 0.01 ng/mL (ref 0.00–0.08)

## 2017-05-29 LAB — ETHANOL: Alcohol, Ethyl (B): 117 mg/dL — ABNORMAL HIGH (ref <5)

## 2017-05-29 LAB — LIPASE, BLOOD: Lipase: 18 U/L (ref 11–51)

## 2017-05-29 MED ORDER — NICOTINE 14 MG/24HR TD PT24
14.0000 mg | MEDICATED_PATCH | Freq: Every day | TRANSDERMAL | Status: DC
  Administered 2017-05-29 – 2017-05-31 (×3): 14 mg via TRANSDERMAL
  Filled 2017-05-29 (×3): qty 1

## 2017-05-29 MED ORDER — BENZTROPINE MESYLATE 1 MG PO TABS
2.0000 mg | ORAL_TABLET | Freq: Every day | ORAL | Status: DC
  Administered 2017-05-29 – 2017-05-30 (×2): 2 mg via ORAL
  Filled 2017-05-29 (×2): qty 2

## 2017-05-29 MED ORDER — ADULT MULTIVITAMIN W/MINERALS CH
1.0000 | ORAL_TABLET | Freq: Every day | ORAL | Status: DC
  Administered 2017-05-29 – 2017-05-31 (×3): 1 via ORAL
  Filled 2017-05-29 (×3): qty 1

## 2017-05-29 MED ORDER — LORAZEPAM 1 MG PO TABS: 1.0000 mg | ORAL_TABLET | Freq: Four times a day (QID) | ORAL | Status: DC | PRN

## 2017-05-29 MED ORDER — METOPROLOL TARTRATE 25 MG PO TABS
25.0000 mg | ORAL_TABLET | Freq: Two times a day (BID) | ORAL | Status: DC
  Administered 2017-05-29 – 2017-05-31 (×4): 25 mg via ORAL
  Filled 2017-05-29 (×4): qty 1

## 2017-05-29 MED ORDER — ALBUTEROL SULFATE (2.5 MG/3ML) 0.083% IN NEBU
2.5000 mg | INHALATION_SOLUTION | Freq: Three times a day (TID) | RESPIRATORY_TRACT | Status: DC
  Administered 2017-05-30 – 2017-05-31 (×4): 2.5 mg via RESPIRATORY_TRACT
  Filled 2017-05-29 (×5): qty 3

## 2017-05-29 MED ORDER — TIOTROPIUM BROMIDE MONOHYDRATE 18 MCG IN CAPS
18.0000 ug | ORAL_CAPSULE | Freq: Every day | RESPIRATORY_TRACT | Status: DC
  Administered 2017-05-29 – 2017-05-31 (×3): 18 ug via RESPIRATORY_TRACT
  Filled 2017-05-29: qty 5

## 2017-05-29 MED ORDER — CEFTRIAXONE SODIUM 1 G IJ SOLR: 1.0000 g | INTRAMUSCULAR | Status: DC

## 2017-05-29 MED ORDER — METHOCARBAMOL 500 MG PO TABS: 750.0000 mg | ORAL_TABLET | Freq: Three times a day (TID) | ORAL | Status: DC | PRN

## 2017-05-29 MED ORDER — DEXTROSE 5 % IV SOLN
INTRAVENOUS | Status: AC
  Filled 2017-05-29: qty 10

## 2017-05-29 MED ORDER — DEXTROSE 5 % IV SOLN
1.0000 g | Freq: Once | INTRAVENOUS | Status: AC
  Administered 2017-05-29: 1 g via INTRAVENOUS
  Filled 2017-05-29: qty 10

## 2017-05-29 MED ORDER — LISINOPRIL 10 MG PO TABS
10.0000 mg | ORAL_TABLET | Freq: Every day | ORAL | Status: DC
  Administered 2017-05-30 – 2017-05-31 (×2): 10 mg via ORAL
  Filled 2017-05-29 (×2): qty 1

## 2017-05-29 MED ORDER — GABAPENTIN 300 MG PO CAPS
600.0000 mg | ORAL_CAPSULE | Freq: Every day | ORAL | Status: DC
  Administered 2017-05-29 – 2017-05-30 (×2): 600 mg via ORAL
  Filled 2017-05-29 (×2): qty 2

## 2017-05-29 MED ORDER — ELVITEG-COBIC-EMTRICIT-TENOFAF 150-150-200-10 MG PO TABS
1.0000 | ORAL_TABLET | Freq: Every day | ORAL | Status: DC
  Administered 2017-05-30: 1 via ORAL
  Filled 2017-05-29 (×2): qty 1

## 2017-05-29 MED ORDER — SODIUM CHLORIDE 0.9 % IV BOLUS (SEPSIS)
1000.0000 mL | Freq: Once | INTRAVENOUS | Status: AC
  Administered 2017-05-29: 1000 mL via INTRAVENOUS

## 2017-05-29 MED ORDER — FOLIC ACID 1 MG PO TABS
1.0000 mg | ORAL_TABLET | Freq: Every day | ORAL | Status: DC
  Administered 2017-05-29 – 2017-05-31 (×3): 1 mg via ORAL
  Filled 2017-05-29 (×3): qty 1

## 2017-05-29 MED ORDER — SODIUM CHLORIDE 0.9 % IV SOLN
INTRAVENOUS | Status: DC
  Administered 2017-05-29 – 2017-05-31 (×3): via INTRAVENOUS

## 2017-05-29 MED ORDER — MIRTAZAPINE 15 MG PO TABS
15.0000 mg | ORAL_TABLET | Freq: Every day | ORAL | Status: DC
  Administered 2017-05-29 – 2017-05-30 (×2): 15 mg via ORAL
  Filled 2017-05-29: qty 0.5
  Filled 2017-05-29: qty 1

## 2017-05-29 MED ORDER — LORAZEPAM 2 MG/ML IJ SOLN: 1.0000 mg | Freq: Four times a day (QID) | INTRAMUSCULAR | Status: DC | PRN

## 2017-05-29 MED ORDER — POTASSIUM CHLORIDE CRYS ER 20 MEQ PO TBCR
20.0000 meq | EXTENDED_RELEASE_TABLET | Freq: Three times a day (TID) | ORAL | Status: DC
  Administered 2017-05-29 – 2017-05-31 (×5): 20 meq via ORAL
  Filled 2017-05-29 (×5): qty 1

## 2017-05-29 MED ORDER — DEXTROSE 5 % IV SOLN
500.0000 mg | Freq: Once | INTRAVENOUS | Status: AC
  Administered 2017-05-29: 500 mg via INTRAVENOUS
  Filled 2017-05-29: qty 500

## 2017-05-29 MED ORDER — MAGNESIUM OXIDE 400 (241.3 MG) MG PO TABS
400.0000 mg | ORAL_TABLET | Freq: Two times a day (BID) | ORAL | Status: DC
  Administered 2017-05-29 – 2017-05-31 (×4): 400 mg via ORAL
  Filled 2017-05-29 (×4): qty 1

## 2017-05-29 MED ORDER — VALBENAZINE TOSYLATE 80 MG PO CAPS
80.0000 mg | ORAL_CAPSULE | Freq: Two times a day (BID) | ORAL | Status: DC
  Filled 2017-05-29 (×2): qty 1

## 2017-05-29 MED ORDER — HALOPERIDOL 5 MG PO TABS
10.0000 mg | ORAL_TABLET | Freq: Two times a day (BID) | ORAL | Status: DC
  Administered 2017-05-29 – 2017-05-31 (×4): 10 mg via ORAL
  Filled 2017-05-29 (×4): qty 2

## 2017-05-29 MED ORDER — BENZTROPINE MESYLATE 1 MG PO TABS
1.0000 mg | ORAL_TABLET | Freq: Every day | ORAL | Status: DC
  Administered 2017-05-30 – 2017-05-31 (×2): 1 mg via ORAL
  Filled 2017-05-29 (×2): qty 1

## 2017-05-29 MED ORDER — APIXABAN 2.5 MG PO TABS
2.5000 mg | ORAL_TABLET | Freq: Two times a day (BID) | ORAL | Status: DC
  Administered 2017-05-29 – 2017-05-31 (×4): 2.5 mg via ORAL
  Filled 2017-05-29 (×4): qty 1

## 2017-05-29 MED ORDER — VITAMIN B-1 100 MG PO TABS
100.0000 mg | ORAL_TABLET | Freq: Every day | ORAL | Status: DC
  Administered 2017-05-29 – 2017-05-31 (×3): 100 mg via ORAL
  Filled 2017-05-29 (×3): qty 1

## 2017-05-29 MED ORDER — DEXTROSE 5 % IV SOLN: 500.0000 mg | INTRAVENOUS | Status: DC

## 2017-05-29 MED ORDER — THIAMINE HCL 100 MG/ML IJ SOLN
100.0000 mg | Freq: Every day | INTRAMUSCULAR | Status: DC
  Filled 2017-05-29: qty 2

## 2017-05-29 NOTE — Progress Notes (Signed)
Pharmacy Antibiotic Note  [name] Herring is a 56 y.o. male with SOB and fatigue admitted on 05/29/2017 with pneumonia.  Pharmacy has been consulted for rocephin and zmax dosing.  Plan: Rocephin 1 gm IV q24h Zmax 500 mg IV q24h Rx will sign off as no further adjustments are needed  Height: 5\' 10"  (177.8 cm) Weight: 135 lb (61.2 kg) IBW/kg (Calculated) : 73  Temp (24hrs), Avg:97.7 F (36.5 C), Min:97.7 F (36.5 C), Max:97.7 F (36.5 C)   Recent Labs Lab 05/29/17 1753 05/29/17 1806 05/29/17 2035  WBC 6.7  --   --   CREATININE 1.13  --   --   LATICACIDVEN  --  7.71* 7.61*    Estimated Creatinine Clearance: 63.9 mL/min (by C-G formula based on SCr of 1.13 mg/dL).    Allergies  Allergen Reactions  . Amoxicillin Shortness Of Breath and Rash    Has patient had a PCN reaction causing immediate rash, facial/tongue/throat swelling, [name] Has patient had a PCN reaction causing severe rash involving mucus membranes or skin necrosis: no Has patient had a PCN reaction that required hospitalization: yes drs office visit Has patient had a PCN reaction occurring within the last 10 years: no If all of the above answers are "NO", then may proceed with Cephalosporin use.   . Latex Shortness Of Breath    Antimicrobials this admission: 9/4 rocephin >>  9/4 zmax >>   Dose adjustments this admission:   Microbiology results:  BCx:   UCx:    Sputum:    MRSA PCR:   Thank you for allowing pharmacy to be a part of this patient's care.  Dorrene German 05/29/2017 10:10 PM

## 2017-05-29 NOTE — H&P (Signed)
History and Physical    [name] Herring UQJ:[id number] DOB: September 05, 1961 DOA: 05/29/2017  PCP: Arnoldo Morale, MD Consultants:  Johnnye Sima - HIV Patient coming from:  Home - lives alone; NOK: Sister, [phone]  Chief Complaint: sweats, vomiting  HPI: [name] Herring is a 56 y.o. male with medical history significant of schizophrenia, PE, HIV, and HTN presenting with "I got pneumonia".  Sweats, vomiting.  Symptoms started about 4 days ago.  +cough, nonproductive.  +SOB with exertion.  No fevers.  Diffuse chest pain for months.  HIV+, sees Dr. Johnnye Sima every 6 months.  VL 152 in 2/18, CD4 290 in 2/18.   ED Course: Ran out of COPD inhalers 4 days ago.  Lactate 7.7 with basilar opacity on CXR concerning for PNA.  Sepsis protocol, Rocephin, Azithro.  2L IVF.  Well-appearing with improved BP and HR.  Review of Systems: As per HPI; otherwise review of systems reviewed and negative.   Ambulatory Status:  Ambulates without assistance  Past Medical History:  Diagnosis Date  . Depression   . HIV (human immunodeficiency virus infection) ([place])   . Hypertension   . Immune deficiency disorder ([place])   . PE (pulmonary thromboembolism) ([place]) 2018  . Personality disorder   . Schizophrenia [facility])     Past Surgical History:  Procedure Laterality Date  . IR GENERIC HISTORICAL  11/04/2016   IR IVC FILTER PLMT / S&I Burke Keels GUID/MOD SED 11/04/2016 Aletta Edouard, MD [facility]-INTERV RAD    Social History   Social History  . Marital status: Divorced    Spouse name: N/A  . Number of children: N/A  . Years of education: N/A   Occupational History  . disabled    Social History Main Topics  . Smoking status: Current Every Day Smoker    Packs/day: 1.00    Years: 33.00    Types: Cigarettes  . Smokeless tobacco: Never Used  . Alcohol use 0.0 oz/week     Comment: Drank a fifth this AM, drinks 2x/month  . Drug use: No     Comment: last use summer 2017  . Sexual activity: No     Comment: given condoms   Other  Topics Concern  . Not on file   Social History Narrative  . No narrative on file    Allergies  Allergen Reactions  . Amoxicillin Shortness Of Breath and Rash    Has patient had a PCN reaction causing immediate rash, facial/tongue/throat swelling, [name] Has patient had a PCN reaction causing severe rash involving mucus membranes or skin necrosis: no Has patient had a PCN reaction that required hospitalization: yes drs office visit Has patient had a PCN reaction occurring within the last 10 years: no If all of the above answers are "NO", then may proceed with Cephalosporin use.   . Latex Shortness Of Breath    Family History  Problem Relation Age of Onset  . CAD Mother   . Kidney disease Sister   . Lupus Brother   . Cancer Maternal Grandmother   . Stroke Paternal Grandmother     Prior to Admission medications   Medication Sig Start Date End Date Taking? Authorizing Provider  acetaminophen (TYLENOL) 500 MG tablet Take 1 tablet (500 mg total) by mouth every 6 (six) hours as needed for moderate pain. Patient not taking: Reported on 01/17/2017 11/07/16   Eugenie Filler, MD  benztropine (COGENTIN) 1 MG tablet 1 tab in the morning 2 tabs at night 11/10/16  McClung, Angela M, PA-C  ELIQUIS 2.5 MG TABS tablet TAKE ONE TABLET BY MOUTH TWICE DAILY 05/14/17   Arnoldo Morale, MD  elvitegravir-cobicistat-emtricitabine-tenofovir (GENVOYA) 150-150-200-10 MG TABS tablet TAKE 1 TABLET BY MOUTH DAILY WITH BREAKFAST. Christena Flake [phone] to see Dr Johnnye Sima ASAP* 07/17/16   Janece Canterbury, MD  gabapentin (NEURONTIN) 600 MG tablet Take 600 mg by mouth at bedtime.    [provider]  haloperidol (HALDOL) 10 MG tablet Take 10 mg by mouth 2 (two) times daily.    [provider]  ibuprofen (ADVIL,MOTRIN) 200 MG tablet Take 400 mg by mouth every 6 (six) hours as needed for moderate pain.     [provider]  lisinopril (PRINIVIL,ZESTRIL) 10  MG tablet Take 1 tablet (10 mg total) by mouth daily. 03/20/17   Arnoldo Morale, MD  magnesium oxide (MAG-OX) 400 (241.3 Mg) MG tablet Take 1 tablet (400 mg total) by mouth 2 (two) times daily. 11/07/16   Eugenie Filler, MD  methocarbamol (ROBAXIN-750) 750 MG tablet Take 1 tablet (750 mg total) by mouth every 8 (eight) hours as needed for muscle spasms. 03/20/17   Arnoldo Morale, MD  metoprolol tartrate (LOPRESSOR) 25 MG tablet Take 1 tablet (25 mg total) by mouth 2 (two) times daily. 03/20/17   Arnoldo Morale, MD  mirtazapine (REMERON) 15 MG tablet Take 15 mg by mouth at bedtime.    [provider]  Multiple Vitamin (MULTIVITAMIN WITH MINERALS) TABS tablet Take 1 tablet by mouth daily. 11/08/16   Eugenie Filler, MD  ondansetron (ZOFRAN) 4 MG tablet Take 1 tablet (4 mg total) by mouth every 6 (six) hours. 03/22/17   Montine Circle, PA-C  polyethylene glycol powder (GLYCOLAX/MIRALAX) powder Take 17 g by mouth daily. Patient not taking: Reported on 03/22/2017 03/20/17   Arnoldo Morale, MD  potassium chloride SA (K-DUR,KLOR-CON) 20 MEQ tablet TAKE ONE TABLET BY MOUTH THREE TIMES DAILY 02/01/17   Arnoldo Morale, MD  thiamine 100 MG tablet Take 1 tablet (100 mg total) by mouth daily. Patient not taking: Reported on 01/17/2017 11/08/16   Eugenie Filler, MD  tiotropium [facility]) 18 MCG inhalation capsule Place 1 capsule (18 mcg total) into inhaler and inhale daily. 11/08/16   Eugenie Filler, MD  Valbenazine Tosylate [facility]) 80 MG CAPS Take 80 mg by mouth 2 (two) times daily.     [provider]    Physical Exam: Vitals:   05/29/17 2100 05/29/17 2130 05/29/17 2250 05/29/17 2309  BP: 120/85 115/76 127/75   Pulse: (!) 101  80   Resp: (!) 24 20 20    Temp:   (!) 97.5 F (36.4 C)   TempSrc:   Oral   SpO2: 95%  92% 93%  Weight:      Height:         General: Appears calm and comfortable and is NAD, somewhat disheveled Eyes:  PERRL, EOMI, normal lids, iris ENT:  grossly normal  hearing, lips & tongue, mmm Neck:  no LAD, masses or thyromegaly; no carotid bruits Cardiovascular:  RRR, no m/r/g. No LE edema.  Respiratory:   CTA bilaterally with no wheezes/rales/rhonchi.  Normal respiratory effort. Abdomen:  soft, NT, ND, NABS Back:   normal alignment, no CVAT Skin:  no rash or induration seen on limited exam Musculoskeletal:  grossly normal tone BUE/BLE, good ROM, no bony abnormality Psychiatric:  grossly normal mood and affect, speech fluent and appropriate, AOx3, +tardive noted Neurologic:  CN 2-12 grossly intact, moves all extremities in coordinated fashion, sensation  intact    Radiological Exams on Admission: Dg Chest 2 View  Result Date: 05/29/2017 CLINICAL DATA:  Shortness of breath, cough, nausea, vomiting, and diaphoresis for 5 days. EXAM: CHEST  2 VIEW COMPARISON:  12/02/2016 FINDINGS: The cardiac silhouette appears mildly enlarged, accentuated by AP projection. The lungs are slightly less well inflated than on the prior study, and there is mild opacity in the right greater than left lung bases. No edema, pleural effusion, or pneumothorax is identified. Underlying emphysematous changes are again noted in the upper lobes. No acute osseous abnormality is seen. IMPRESSION: Mild bibasilar opacity, likely atelectasis. Electronically Signed   By: Logan Bores M.D.   On: 05/29/2017 18:02    EKG: Independently reviewed.  NSR with rate 84; no evidence of acute ischemia, NSCSLT   Labs on Admission: I have personally reviewed the available labs and imaging studies at the time of the admission.  Pertinent labs:   K+ 3.3 CO2 18 Anion gap 17 Albumin 3.2 Troponin 0.01 Lactate 7.71, 7.61, 7.8 WBC 6.7 Hgb 11.0, prior 11.4 in 6/18 UA WNL ETOH 117 at 2024 Procalcitonin <0.10  Assessment/Plan Principal Problem:   Pneumonia Active Problems:   Human immunodeficiency virus (HIV) disease (HCC)   Hypothyroidism   Alcohol use disorder, severe, dependence (HCC)   HTN  (hypertension)   Tobacco abuse   PE (pulmonary thromboembolism) (HCC)   Elevated lactic acid level    Pneumonia with elevated lactate level -Given cough and possible infiltrates lower lobes on chest x-ray in an immunocompromised patient, will treat for pneumonia.  -CURB-65 score is 1 - will admit the patient to Med Surg. -CURB-65 score is 3+, meaning that the patient has a >20% risk of death and ICU care should be considered. -Pneumonia Severity Index (PSI) is Class 2, 0.6% mortality. -Given his HIV status and most recent viral load that was detectable, will treat for MDR (multi-drug resistance) organisms with Zosyn and Vancomycin. -NS @ 75cc/hr -Fever control -Repeat CBC in am -Sputum cultures -Blood cultures -Strep pneumo testing -Negative procalcitonin level is reassuring for no serious infection. -Suspect that his elevated lactate is related to his ongoing elevated ETOH level and will clear with ongoing time and hydration. -He does not appear to be septic at this time. -albuterol PRN -He ran out of Spiriva at home and will need this refilled prior to discharge.  For now it has been reordered.  HIV infection -CD4 count and viral load were less stellar in 2/18 -Will recheck both today -Continue Genvoya -Due for recheck in HIV clinic  Hypothyroidism -Reported h/o but he is not taking medication for this -Will check TSH  ETOH dependence, h/o cocaine abuse -Patient with chronic ETOH dependence -He is at high risk for complications of withdrawal including seizures, DTs -CIWA protocol -Check UDS  HTN -Continue Lisinopril and Lopressor  H/o PE -Hospitalized for submassive PE with right heart strain in 2/18  -He had an IVC filter placed  -He continues to take Eliquis - although his compliance is not assured  Tobacco dependence -Encourage cessation.  This was discussed with the patient and should be reviewed on an ongoing basis.   -Patch ordered at patient request.  DVT  prophylaxis: Eliquis Code Status:  Full - confirmed with patient Family Communication: None present Disposition Plan:  Home once clinically improved Consults called: None Admission status: Admit - It is my clinical opinion that admission to INPATIENT is reasonable and necessary because this patient will require at least 2 midnights in the  hospital to treat this condition based on the medical complexity of the problems presented.  Given the aforementioned information, the predictability of an adverse outcome is felt to be significant.    Karmen Bongo MD [facility]  If note is complete, please contact covering daytime or nighttime physician. [email or web address] Password [email or web address]  05/30/2017, 12:23 AM

## 2017-05-29 NOTE — Progress Notes (Signed)
A consult was received from an ED physician for Ceftriaxone and Azithromycin per pharmacy dosing.  The patient's profile has been reviewed for ht/wt/allergies/indication/available labs.    A one time order has already been placed by ED provider for Ceftriaxone 1g IV and Azithromycin 500mg  IV. Noted that patient has amoxicillin allergy, but has received both Ceftriaxone and Cefepime on previous admissions. Further antibiotics/pharmacy consults should be ordered by admitting physician if indicated.                       Thank you,  Lindell Spar, PharmD, BCPS Pager: [phone] 05/29/2017 6:29 PM

## 2017-05-29 NOTE — ED Notes (Addendum)
Patient incontinent large amount liquid brown BM. Patient ambulatory to bathroom and assisted patient to clean self-complete linen change

## 2017-05-29 NOTE — ED Notes (Signed)
MD and RN made aware of elevated Lactic: 7.71

## 2017-05-29 NOTE — Progress Notes (Signed)
Callback from Dr. Myna Hidalgo and advised to contact Dr. Lorin Mercy for orders regarding Lactic value because Dr. Lorin Mercy has not entered completed note and had admitted patient. Will contact Dr. Lorin Mercy.

## 2017-05-29 NOTE — ED Triage Notes (Signed)
Per EMS, patient comes from home c/o SOB. Pt lethargic, shallow breathing and arousable to stimulation on scene. States he has been vomiting x2days with a cough.  EMS was called at 1130 this morning for SOB but didn't want to go to hospital at that time. Per EMS, pt frequenty calls about every 2wks and has ETOH abuse. Upon arrival to ED, patient finishing second albuterol neb. 10mg  total, 1 mg atrovent, 125 solumedrol given in route. Sugar 70, pt non diabetic and sates he ate today.

## 2017-05-29 NOTE — Progress Notes (Signed)
Patient arrived to room 1507 from ED. Patient is A&Ox4. Patient lives at home alone, has PCP doctor and health clinic doctor. Reports he takes meds as ordered. Has IV site to Left and Right Forearm. Sites flushed without difficult. Oriented to room, call bell, bed use, plan of care. Advised to call if he needs to get up due to weakness. Patient verbalized understanding. Will c/t monitor.

## 2017-05-29 NOTE — ED Provider Notes (Signed)
[facility] DEPT Provider Note   CSN: [id number] Arrival date & time: 05/29/17  1649     History   Chief Complaint Chief Complaint  Patient presents with  . Shortness of Breath  . Fatigue    HPI [name] Herring is a 56 y.o. male.  Patient is a 56 year old male with a history of HIV, hypertension, paranoid schizophrenia, depression,PE on Eliquis, heavy alcohol abuse and polysubstance abuse presenting today with shortness of breath. Patient has a history of COPD and states ran out of his inhalers 4 days ago. Patient's shortness of breath and cough started 3 days ago. He also complains of productive cough. He denies fever. He's been taking all of his medications including his anticoagulation. His last viral loads were undetectable per the patient.  Patient also states for the last 2-3 days he's been vomiting but will intermittently be able to hold down food and states a half a pizza today without vomiting. Patient also drinks significant amounts of alcohol daily. He states prior to arrival today he drank up height of vodka. When EMS arrived patient was lethargic and breathing shallow but would respond. Patient was given albuterol and Atrovent with improvement in his shortness of breath. He currently is denying any vomiting and states his abdomen is sore everywhere but no localized pain. He denies chest pain or lower extremity swelling.   The history is provided by the patient and the EMS personnel.  Shortness of Breath  This is a new problem. The average episode lasts 3 days. The problem occurs continuously.Episode onset: 3 days ago. The problem has been rapidly worsening. Associated symptoms include cough, sputum production, wheezing and vomiting. Pertinent negatives include no fever, no chest pain, no abdominal pain and no leg swelling. Precipitated by: ran out of meds. Risk factors include smoking. He has tried nothing for the symptoms. The treatment provided no relief. He has had prior  hospitalizations. He has had prior ED visits. Associated medical issues include COPD, pneumonia and PE. Associated medical issues do not include CAD. Associated medical issues comments: HIV.    Past Medical History:  Diagnosis Date  . Depression   . HIV (human immunodeficiency virus infection) ([place])   . Hypertension   . Immune deficiency disorder ([place])   . PE (pulmonary thromboembolism) ([place]) 2018  . Personality disorder   . Schizophrenia [facility])     Patient Active Problem List   Diagnosis Date Noted  . Emphysema lung ([place]) 11/20/2016  . Deep vein thrombosis (DVT) of left lower extremity ([place])   . DVT of lower extremity, bilateral ([place]) 11/04/2016  . Tobacco abuse 11/02/2016  . PE (pulmonary thromboembolism) ([place]) 11/02/2016  . Acute respiratory failure with hypoxia ([place]) 07/17/2016  . CAP (community acquired pneumonia) 07/16/2016  . Cocaine dependence with cocaine-induced mood disorder ([place]) 03/29/2016  . HTN (hypertension) 08/25/2015  . Paranoid schizophrenia, chronic condition ([place]) 12/30/2014  . Cocaine use disorder, severe, dependence ([place]) 12/27/2014  . Alcohol use disorder, severe, dependence ([place]) 12/27/2014  . ERECTILE DYSFUNCTION 09/03/2007  . FOOT PAIN, BILATERAL 07/10/2007  . Human immunodeficiency virus (HIV) disease ([place]) 11/09/2006  . VIRAL MENINGITIS 11/09/2006  . THRUSH 11/09/2006  . CA IN SITU, RECTUM 11/09/2006  . HYPOTHYROIDISM 11/09/2006  . DISORDER, BIPOLAR NOS 11/09/2006  . PYELONEPHRITIS 11/09/2006    Past Surgical History:  Procedure Laterality Date  . IR GENERIC HISTORICAL  11/04/2016   IR IVC FILTER PLMT / S&I Burke Keels GUID/MOD SED 11/04/2016 Aletta Edouard, MD [facility]-INTERV RAD  Home Medications    Prior to Admission medications   Medication Sig Start Date End Date Taking? Authorizing Provider  acetaminophen (TYLENOL) 500 MG tablet Take 1 tablet (500 mg total) by mouth every 6 (six) hours as needed for moderate pain. Patient not taking:  Reported on 01/17/2017 11/07/16   Eugenie Filler, MD  benztropine (COGENTIN) 1 MG tablet 1 tab in the morning 2 tabs at night 11/10/16   McClung, Angela M, PA-C  ELIQUIS 2.5 MG TABS tablet TAKE ONE TABLET BY MOUTH TWICE DAILY 05/14/17   Arnoldo Morale, MD  elvitegravir-cobicistat-emtricitabine-tenofovir (GENVOYA) 150-150-200-10 MG TABS tablet TAKE 1 TABLET BY MOUTH DAILY WITH BREAKFAST. Christena Flake [phone] to see Dr Johnnye Sima ASAP* 07/17/16   Janece Canterbury, MD  gabapentin (NEURONTIN) 600 MG tablet Take 600 mg by mouth at bedtime.    [provider]  haloperidol (HALDOL) 10 MG tablet Take 10 mg by mouth 2 (two) times daily.    [provider]  ibuprofen (ADVIL,MOTRIN) 200 MG tablet Take 400 mg by mouth every 6 (six) hours as needed for moderate pain.     [provider]  lisinopril (PRINIVIL,ZESTRIL) 10 MG tablet Take 1 tablet (10 mg total) by mouth daily. 03/20/17   Arnoldo Morale, MD  magnesium oxide (MAG-OX) 400 (241.3 Mg) MG tablet Take 1 tablet (400 mg total) by mouth 2 (two) times daily. 11/07/16   Eugenie Filler, MD  methocarbamol (ROBAXIN-750) 750 MG tablet Take 1 tablet (750 mg total) by mouth every 8 (eight) hours as needed for muscle spasms. 03/20/17   Arnoldo Morale, MD  metoprolol tartrate (LOPRESSOR) 25 MG tablet Take 1 tablet (25 mg total) by mouth 2 (two) times daily. 03/20/17   Arnoldo Morale, MD  mirtazapine (REMERON) 15 MG tablet Take 15 mg by mouth at bedtime.    [provider]  Multiple Vitamin (MULTIVITAMIN WITH MINERALS) TABS tablet Take 1 tablet by mouth daily. 11/08/16   Eugenie Filler, MD  ondansetron (ZOFRAN) 4 MG tablet Take 1 tablet (4 mg total) by mouth every 6 (six) hours. 03/22/17   Montine Circle, PA-C  polyethylene glycol powder (GLYCOLAX/MIRALAX) powder Take 17 g by mouth daily. Patient not taking: Reported on 03/22/2017 03/20/17   Arnoldo Morale, MD  potassium chloride SA (K-DUR,KLOR-CON) 20 MEQ tablet TAKE ONE TABLET BY MOUTH THREE  TIMES DAILY 02/01/17   Arnoldo Morale, MD  thiamine 100 MG tablet Take 1 tablet (100 mg total) by mouth daily. Patient not taking: Reported on 01/17/2017 11/08/16   Eugenie Filler, MD  tiotropium [facility]) 18 MCG inhalation capsule Place 1 capsule (18 mcg total) into inhaler and inhale daily. 11/08/16   Eugenie Filler, MD  Valbenazine Tosylate [facility]) 80 MG CAPS Take 80 mg by mouth 2 (two) times daily.     [provider]    Family History Family History  Problem Relation Age of Onset  . CAD Mother   . Kidney disease Sister   . Lupus Brother   . Cancer Maternal Grandmother   . Stroke Paternal Grandmother     Social History Social History  Substance Use Topics  . Smoking status: Current Every Day Smoker    Packs/day: 1.00    Years: 1.00    Types: Cigarettes  . Smokeless tobacco: Never Used  . Alcohol use No     Comment: 8 months ago     Allergies   Amoxicillin and Latex   Review of Systems Review of Systems  Constitutional: Negative for fever.  Respiratory: Positive for cough, sputum production, shortness of breath and wheezing.   Cardiovascular: Negative for chest pain and leg swelling.  Gastrointestinal: Positive for vomiting. Negative for abdominal pain.  All other systems reviewed and are negative.    Physical Exam Updated Vital Signs BP (!) 101/59 (BP Location: Right Arm)   Pulse 85   Temp 97.7 F (36.5 C) (Oral)   Resp (!) 24   Ht 5\' 10"  (1.778 m)   Wt 61.2 kg (135 lb)   SpO2 95%   BMI 19.37 kg/m   Physical Exam  Constitutional: He appears well-developed and well-nourished. No distress.  Smells of alcohol but is able to answer questions appropriately and is cooperative  HENT:  Head: Normocephalic and atraumatic.  Mouth/Throat: Oropharynx is clear and moist. Mucous membranes are dry.  Eyes: Pupils are equal, round, and reactive to light. Conjunctivae and EOM are normal.  Neck: Normal range of motion. Neck supple.  Cardiovascular:  Normal rate, regular rhythm and intact distal pulses.   No murmur heard. Pulmonary/Chest: Effort normal. Tachypnea noted. No respiratory distress. He has decreased breath sounds. He has wheezes. He has rhonchi in the right lower field and the left lower field. He has no rales.  Abdominal: Soft. He exhibits no distension. There is tenderness. There is no rebound and no guarding.  Mild tenderness throughout but no localized pain  Musculoskeletal: Normal range of motion. He exhibits no edema or tenderness.  Neurological: He is alert.  Skin: Skin is warm and dry. No rash noted. No erythema.  Psychiatric: He has a normal mood and affect. His behavior is normal.  Nursing note and vitals reviewed.    ED Treatments / Results  Labs (all labs ordered are listed, but only abnormal results are displayed) Labs Reviewed  CBC WITH DIFFERENTIAL/PLATELET - Abnormal; Notable for the following:       Result Value   RBC 3.74 (*)    Hemoglobin 11.0 (*)    HCT 32.7 (*)    RDW 17.1 (*)    All other components within normal limits  COMPREHENSIVE METABOLIC PANEL - Abnormal; Notable for the following:    Potassium 3.3 (*)    CO2 18 (*)    Albumin 3.2 (*)    Anion gap 17 (*)    All other components within normal limits  I-STAT CG4 LACTIC ACID, ED - Abnormal; Notable for the following:    Lactic Acid, Venous 7.71 (*)    All other components within normal limits  CULTURE, BLOOD (ROUTINE X 2)  CULTURE, BLOOD (ROUTINE X 2)  LIPASE, BLOOD  URINALYSIS, ROUTINE W REFLEX MICROSCOPIC  I-STAT TROPONIN, ED  I-STAT CG4 LACTIC ACID, ED    EKG  EKG Interpretation  Date/Time:  Tuesday May 29 2017 17:09:48 EDT Ventricular Rate:  84 PR Interval:    QRS Duration: 95 QT Interval:  406 QTC Calculation: 480 R Axis:   15 Text Interpretation:  Sinus rhythm Borderline prolonged QT interval No significant change since last tracing Confirmed by Blanchie Dessert [id number]) on 05/29/2017 5:35:54 PM        Radiology Dg Chest 2 View  Result Date: 05/29/2017 CLINICAL DATA:  Shortness of breath, cough, nausea, vomiting, and diaphoresis for 5 days. EXAM: CHEST  2 VIEW COMPARISON:  12/02/2016 FINDINGS: The cardiac silhouette appears mildly enlarged, accentuated by AP projection. The lungs are slightly less well inflated than on the prior study, and there is mild opacity in the right greater than left lung bases. No edema, pleural  effusion, or pneumothorax is identified. Underlying emphysematous changes are again noted in the upper lobes. No acute osseous abnormality is seen. IMPRESSION: Mild bibasilar opacity, likely atelectasis. Electronically Signed   By: Logan Bores M.D.   On: 05/29/2017 18:02    Procedures Procedures (including critical care time)  Medications Ordered in ED Medications  sodium chloride 0.9 % bolus 1,000 mL (not administered)     Initial Impression / Assessment and Plan / ED Course  I have reviewed the triage vital signs and the nursing notes.  Pertinent labs & imaging results that were available during my care of the patient were reviewed by me and considered in my medical decision making (see chart for details).     Patient with multiple medical problems presenting today with shortness of breath. Symptoms could be related to COPD exacerbation as patient does run out of his inhalers 4 days ago however could also be pneumonia as patient has had a productive cough. Patient also complains of vomiting but this could be related to his heavy alcohol use daily. Patient has no evidence of fluid overload suggestive of CHF. Will do an EKG and troponin and ensure this is not a cardiac cause of shortness of breath. Lower suspicion for PE as patient does have a prior history of this and currently is on anticoagulation which she admits to taking daily and has not missed any doses. No abdominal findings on exam concerning for cholecystitis, appendicitis, obstruction or diverticulitis.  Possible mild pancreatitis given his drinking history.  After albuterol nebs given by EMS patient is breathing much more comfortably. Oxygenation 95% on room air and breath sounds are relatively clear with mild rales/rhonchi in the bases.  Patient states his last viral load was undetectable also lower suspicion for PCP.  7:30 PM Evaluation showed a lactic acid of 7.7 with a basilar opacity on x-ray that could be atelectasis versus early pneumonia. Rest of labs without significant finding. Patient started on sepsis protocol and given Rocephin and azithromycin given symptoms and lactic acid. He was fluid resuscitated with 2 L. On reevaluation patient is well-appearing with improved blood pressure and heart rate. Will admit for observation for ongoing hydration. Patient has had no further vomiting here.  No lab findings concerning for pancreatitis or hepatitis at this time.  Final Clinical Impressions(s) / ED Diagnoses   Final diagnoses:  COPD exacerbation ([place])  Community acquired pneumonia, unspecified laterality  Lactic acidosis    New Prescriptions New Prescriptions   No medications on file     Blanchie Dessert, MD 05/29/17 1932

## 2017-05-29 NOTE — ED Notes (Signed)
2nd liter NS fluid bolus infusing-Rocephin IV per pump

## 2017-05-29 NOTE — ED Notes (Signed)
Patient given peanut butter and graham crackers and OJ-applied 2 more warm blankets

## 2017-05-29 NOTE — Progress Notes (Signed)
Contacted by pharmacy to ask patient if he has his Valbenazine with him because is not on formulary and if he does not can someone bring to him tomorrow. Patient does not have with him but can see if someone can bring tomorrow. Will c/t monitor.

## 2017-05-29 NOTE — Progress Notes (Signed)
CRITICAL VALUE ALERT  Critical Value:  7.8 Lactic   Date & Time Notied:  05/29/17 2356  Provider Notified: Dr. Lorin Mercy  Orders Received/Actions taken: Awaiting callback.

## 2017-05-29 NOTE — Progress Notes (Signed)
Callback from Dr. Lorin Mercy with order to given NS 500cc bolus x1. Verbal order read back. Will c/t monitor.

## 2017-05-29 NOTE — Progress Notes (Signed)
CRITICAL VALUE ALERT  Critical Value:  Lactic 7.8  Date & Time Notied:  05/29/17 at 21  Provider Notified: Dr. Myna Hidalgo  Orders Received/Actions taken: Awaiting callback

## 2017-05-30 DIAGNOSIS — J690 Pneumonitis due to inhalation of food and vomit: Principal | ICD-10-CM

## 2017-05-30 DIAGNOSIS — F1020 Alcohol dependence, uncomplicated: Secondary | ICD-10-CM

## 2017-05-30 DIAGNOSIS — J441 Chronic obstructive pulmonary disease with (acute) exacerbation: Secondary | ICD-10-CM

## 2017-05-30 DIAGNOSIS — R7989 Other specified abnormal findings of blood chemistry: Secondary | ICD-10-CM

## 2017-05-30 LAB — RAPID URINE DRUG SCREEN, HOSP PERFORMED
AMPHETAMINES: NOT DETECTED
BARBITURATES: NOT DETECTED
Benzodiazepines: NOT DETECTED
Cocaine: NOT DETECTED
Opiates: NOT DETECTED
TETRAHYDROCANNABINOL: NOT DETECTED

## 2017-05-30 LAB — CBC WITH DIFFERENTIAL/PLATELET
Basophils Absolute: 0 10*3/uL (ref 0.0–0.1)
Basophils Relative: 0 %
Eosinophils Absolute: 0 10*3/uL (ref 0.0–0.7)
Eosinophils Relative: 0 %
HCT: 33.7 % — ABNORMAL LOW (ref 39.0–52.0)
HEMOGLOBIN: 11.2 g/dL — AB (ref 13.0–17.0)
LYMPHS ABS: 0.4 10*3/uL — AB (ref 0.7–4.0)
LYMPHS PCT: 13 %
MCH: 29.2 pg (ref 26.0–34.0)
MCHC: 33.2 g/dL (ref 30.0–36.0)
MCV: 87.8 fL (ref 78.0–100.0)
MONOS PCT: 2 %
Monocytes Absolute: 0.1 10*3/uL (ref 0.1–1.0)
NEUTROS PCT: 85 %
Neutro Abs: 2.9 10*3/uL (ref 1.7–7.7)
Platelets: 124 10*3/uL — ABNORMAL LOW (ref 150–400)
RBC: 3.84 MIL/uL — AB (ref 4.22–5.81)
RDW: 17 % — ABNORMAL HIGH (ref 11.5–15.5)
WBC: 3.4 10*3/uL — AB (ref 4.0–10.5)

## 2017-05-30 LAB — BASIC METABOLIC PANEL
Anion gap: 9 (ref 5–15)
BUN: 12 mg/dL (ref 6–20)
CO2: 23 mmol/L (ref 22–32)
CREATININE: 0.81 mg/dL (ref 0.61–1.24)
Calcium: 9.2 mg/dL (ref 8.9–10.3)
Chloride: 107 mmol/L (ref 101–111)
Glucose, Bld: 160 mg/dL — ABNORMAL HIGH (ref 65–99)
POTASSIUM: 3.7 mmol/L (ref 3.5–5.1)
SODIUM: 139 mmol/L (ref 135–145)

## 2017-05-30 LAB — CD4/CD8 (T-HELPER/T-SUPPRESSOR CELL)
CD4 ABSOLUTE: 50 /uL — AB (ref 500–1900)
CD4%: 11 % — AB (ref 30.0–60.0)
CD8 T Cell Abs: 90 /uL — ABNORMAL LOW (ref 230–1000)
CD8tox: 19 % (ref 15.0–40.0)
RATIO: 0.56 — AB (ref 1.0–3.0)
Total lymphocyte count: 460 /uL — ABNORMAL LOW (ref 1000–4000)

## 2017-05-30 LAB — STREP PNEUMONIAE URINARY ANTIGEN: STREP PNEUMO URINARY ANTIGEN: NEGATIVE

## 2017-05-30 LAB — LACTIC ACID, PLASMA: Lactic Acid, Venous: 2.6 mmol/L (ref 0.5–1.9)

## 2017-05-30 LAB — TSH: TSH: 0.439 u[IU]/mL (ref 0.350–4.500)

## 2017-05-30 MED ORDER — VANCOMYCIN HCL IN DEXTROSE 750-5 MG/150ML-% IV SOLN
750.0000 mg | Freq: Two times a day (BID) | INTRAVENOUS | Status: DC
Start: 2017-05-30 — End: 2017-05-30
  Administered 2017-05-30: 750 mg via INTRAVENOUS
  Filled 2017-05-30 (×2): qty 150

## 2017-05-30 MED ORDER — IBUPROFEN 200 MG PO TABS
400.0000 mg | ORAL_TABLET | Freq: Four times a day (QID) | ORAL | Status: DC | PRN
  Administered 2017-05-30: 400 mg via ORAL
  Filled 2017-05-30: qty 2

## 2017-05-30 MED ORDER — SODIUM CHLORIDE 0.9 % IV BOLUS (SEPSIS)
500.0000 mL | Freq: Once | INTRAVENOUS | Status: AC
  Administered 2017-05-30: 500 mL via INTRAVENOUS

## 2017-05-30 MED ORDER — CLINDAMYCIN PHOSPHATE 600 MG/50ML IV SOLN
600.0000 mg | Freq: Three times a day (TID) | INTRAVENOUS | Status: DC
  Administered 2017-05-30 – 2017-05-31 (×3): 600 mg via INTRAVENOUS
  Filled 2017-05-30 (×4): qty 50

## 2017-05-30 MED ORDER — DEXTROSE 5 % IV SOLN
1.0000 g | Freq: Three times a day (TID) | INTRAVENOUS | Status: DC
  Administered 2017-05-30: 1 g via INTRAVENOUS
  Filled 2017-05-30 (×2): qty 1

## 2017-05-30 MED ORDER — ALBUTEROL SULFATE (2.5 MG/3ML) 0.083% IN NEBU: 2.5000 mg | INHALATION_SOLUTION | RESPIRATORY_TRACT | Status: DC | PRN

## 2017-05-30 NOTE — Progress Notes (Signed)
PROGRESS NOTE  [name] Herring:[id number] DOB: 1961-09-12 DOA: 05/29/2017 PCP: [name] Morale, MD   LOS: 1 day   Brief Narrative / Interim history: [name] Herring is a 56 y.o. male with medical history significant of schizophrenia, PE, HIV, and HTN presenting with "I got pneumonia".  Sweats, vomiting.  Symptoms started about 4 days ago.  +cough, nonproductive.  +SOB with exertion.  No fevers.  Diffuse chest pain for months.  HIV+, sees Dr. Johnnye Herring every 6 months.  VL 152 in 2/18, CD4 290 in 2/18.  Assessment & Plan: Principal Problem:   Pneumonia Active Problems:   Human immunodeficiency virus (HIV) disease ([place])   Hypothyroidism   Alcohol use disorder, severe, dependence ([place])   HTN (hypertension)   Tobacco abuse   PE (pulmonary thromboembolism) ([facility])   Elevated lactic acid level   Aspiration pneumonia -Patient with weeklong nausea vomiting, he developed cough after that, very high probability that this aspiration pneumonia.  Change antibiotics to clindamycin. -Cultures pending  Alcohol abuse -Appears to be heavy drinker, states that he does not withdraw however he is tremulous on my encounter -Continue CIWA  HIV infection -CD4 count low at 50,?  Noncompliance although patient denies  History of cocaine abuse -Denies recent use  History of PE -Hospitalized for submassive PE right heart strain in February 2018, also had an IVC filter placed.  Continue Eliquis  Tobacco abuse -Encouraged cessation   DVT prophylaxis: Eliquis Code Status: Full code Family Communication: no family at bedside Disposition Plan: home when ready   Consultants:   None   Procedures:   None   Antimicrobials:  Ceftriaxone 9/4  Azithromycin 9/4  Vancomycin 9/4 >> 9/5  Cefepime 9/4 >>9/5  Clindamycin 9/5 >>   Subjective: Feels better, asking when he could go home   Objective: Vitals:   05/30/17 0510 05/30/17 0802 05/30/17 0944 05/30/17 1202  BP: 125/86  140/90 (!) 146/96    Pulse: (!) 57  72 60  Resp: 20  18 17   Temp: 97.8 F (36.6 C)   98.3 F (36.8 C)  TempSrc: Oral   Oral  SpO2: 96% 95% 96% 96%  Weight:      Height:        Intake/Output Summary (Last 24 hours) at 05/30/17 1315 Last data filed at 05/30/17 1152  Gross per 24 hour  Intake          4544.16 ml  Output              650 ml  Net          3894.16 ml   Filed Weights   05/29/17 1703  Weight: 61.2 kg (135 lb)    Examination:  Vitals:   05/30/17 0510 05/30/17 0802 05/30/17 0944 05/30/17 1202  BP: 125/86  140/90 (!) 146/96  Pulse: (!) 57  72 60  Resp: 20  18 17   Temp: 97.8 F (36.6 C)   98.3 F (36.8 C)  TempSrc: Oral   Oral  SpO2: 96% 95% 96% 96%  Weight:      Height:        Constitutional: NAD, mildly tremulous Eyes: lids and conjunctivae normal Respiratory: clear to auscultation bilaterally, no wheezing, no crackles.  Cardiovascular: Regular rate and rhythm, no murmurs / rubs / gallops.  Abdomen: no tenderness. Bowel sounds positive. .  Skin: no rashes, lesions, ulcers. No induration Neurologic: CN 2-12 grossly intact. Strength 5/5 in all 4.    Data Reviewed: I have independently reviewed following  labs and imaging studies  CBC:  Recent Labs Lab 05/29/17 1753 05/30/17 0119  WBC 6.7 3.4*  NEUTROABS 4.5 2.9  HGB 11.0* 11.2*  HCT 32.7* 33.7*  MCV 87.4 87.8  PLT 154 093*   Basic Metabolic Panel:  Recent Labs Lab 05/29/17 1753 05/30/17 0119  NA 139 139  K 3.3* 3.7  CL 104 107  CO2 18* 23  GLUCOSE 82 160*  BUN 14 12  CREATININE 1.13 0.81  CALCIUM 9.5 9.2   GFR: Estimated Creatinine Clearance: 89.2 mL/min (by C-G formula based on SCr of 0.81 mg/dL). Liver Function Tests:  Recent Labs Lab 05/29/17 1753  AST 39  ALT 18  ALKPHOS 55  BILITOT 0.9  PROT 6.7  ALBUMIN 3.2*    Recent Labs Lab 05/29/17 1753  LIPASE 18   No results for input(s): AMMONIA in the last 168 hours. Coagulation Profile:  Recent Labs Lab 05/29/17 2250  INR 1.03    Cardiac Enzymes: No results for input(s): CKTOTAL, CKMB, CKMBINDEX, TROPONINI in the last 168 hours. BNP (last 3 results) No results for input(s): PROBNP in the last 8760 hours. HbA1C: No results for input(s): HGBA1C in the last 72 hours. CBG: No results for input(s): GLUCAP in the last 168 hours. Lipid Profile: No results for input(s): CHOL, HDL, LDLCALC, TRIG, CHOLHDL, LDLDIRECT in the last 72 hours. Thyroid Function Tests:  Recent Labs  05/30/17 0119  TSH 0.439   Anemia Panel: No results for input(s): VITAMINB12, FOLATE, FERRITIN, TIBC, IRON, RETICCTPCT in the last 72 hours. Urine analysis:    Component Value Date/Time   COLORURINE YELLOW 05/29/2017 1832   APPEARANCEUR CLEAR 05/29/2017 1832   LABSPEC 1.006 05/29/2017 1832   PHURINE 6.0 05/29/2017 1832   GLUCOSEU NEGATIVE 05/29/2017 1832   GLUCOSEU NEG mg/dL 10/11/2006 2016   HGBUR NEGATIVE 05/29/2017 1832   BILIRUBINUR NEGATIVE 05/29/2017 1832   KETONESUR NEGATIVE 05/29/2017 1832   PROTEINUR NEGATIVE 05/29/2017 1832   UROBILINOGEN 1 10/11/2006 2016   NITRITE NEGATIVE 05/29/2017 1832   LEUKOCYTESUR NEGATIVE 05/29/2017 1832   Sepsis Labs: Invalid input(s): PROCALCITONIN, LACTICIDVEN  Recent Results (from the past 240 hour(s))  Blood Culture (routine x 2)     Status: None (Preliminary result)   Collection Time: 05/29/17  6:29 PM  Result Value Ref Range Status   Specimen Description BLOOD RIGHT FOREARM  Final   Special Requests   Final    BOTTLES DRAWN AEROBIC AND ANAEROBIC Blood Culture adequate volume   Culture   Final    NO GROWTH < 12 HOURS Performed at [facility], [address], [place], [place] [address]    Report Status PENDING  Incomplete  Blood Culture (routine x 2)     Status: None (Preliminary result)   Collection Time: 05/29/17  6:31 PM  Result Value Ref Range Status   Specimen Description BLOOD LEFT HAND  Final   Special Requests   Final    BOTTLES DRAWN AEROBIC AND ANAEROBIC Blood  Culture adequate volume   Culture   Final    NO GROWTH < 12 HOURS Performed at [facility], [place] [address], [place], [place] [address]    Report Status PENDING  Incomplete      Radiology Studies: Dg Chest 2 View  Result Date: 05/29/2017 CLINICAL DATA:  Shortness of breath, cough, nausea, vomiting, and diaphoresis for 5 days. EXAM: CHEST  2 VIEW COMPARISON:  12/02/2016 FINDINGS: The cardiac silhouette appears mildly enlarged, accentuated by AP projection. The lungs are  slightly less well inflated than on the prior study, and there is mild opacity in the right greater than left lung bases. No edema, pleural effusion, or pneumothorax is identified. Underlying emphysematous changes are again noted in the upper lobes. No acute osseous abnormality is seen. IMPRESSION: Mild bibasilar opacity, likely atelectasis. Electronically Signed   By: Logan Bores M.D.   On: 05/29/2017 18:02     Scheduled Meds: . albuterol  2.5 mg Nebulization TID  . apixaban  2.5 mg Oral BID  . benztropine  1 mg Oral Daily  . benztropine  2 mg Oral QHS  . elvitegravir-cobicistat-emtricitabine-tenofovir  1 tablet Oral Q breakfast  . folic acid  1 mg Oral Daily  . gabapentin  600 mg Oral QHS  . haloperidol  10 mg Oral BID  . lisinopril  10 mg Oral Daily  . magnesium oxide  400 mg Oral BID  . metoprolol tartrate  25 mg Oral BID  . mirtazapine  15 mg Oral QHS  . multivitamin with minerals  1 tablet Oral Daily  . nicotine  14 mg Transdermal Daily  . potassium chloride SA  20 mEq Oral TID  . thiamine  100 mg Oral Daily  . tiotropium  18 mcg Inhalation Daily  . Valbenazine Tosylate  80 mg Oral BID   Continuous Infusions: . sodium chloride 125 mL/hr at 05/30/17 0621  . clindamycin (CLEOCIN) IV Stopped (05/30/17 1222)    Marzetta Board, MD, PhD [facility] Pager [phone]  If 7PM-7AM, please contact night-coverage [email or web address] Password [email or web address] 05/30/2017, 1:15 PM

## 2017-05-30 NOTE — Progress Notes (Signed)
Pharmacy Antibiotic Note  [name] Herring is a 56 y.o. male with SOB and fatigue admitted on 05/29/2017 with sepsis.   Pharmacy has been consulted for cefepime and vancomycin dosing.  Plan: Cefepime 1 Gm IV q8h Vancomycin 750 mg IV q12h VT=15-20 mg/L   Height: 5\' 10"  (177.8 cm) Weight: 135 lb (61.2 kg) IBW/kg (Calculated) : 73  Temp (24hrs), Avg:97.6 F (36.4 C), Min:97.5 F (36.4 C), Max:97.7 F (36.5 C)   Recent Labs Lab 05/29/17 1753 05/29/17 1806 05/29/17 2035 05/29/17 2250  WBC 6.7  --   --   --   CREATININE 1.13  --   --   --   LATICACIDVEN  --  7.71* 7.61* 7.8*    Estimated Creatinine Clearance: 63.9 mL/min (by C-G formula based on SCr of 1.13 mg/dL).    Allergies  Allergen Reactions  . Amoxicillin Shortness Of Breath and Rash    Has patient had a PCN reaction causing immediate rash, facial/tongue/throat swelling, [name] Has patient had a PCN reaction causing severe rash involving mucus membranes or skin necrosis: no Has patient had a PCN reaction that required hospitalization: yes drs office visit Has patient had a PCN reaction occurring within the last 10 years: no If all of the above answers are "NO", then may proceed with Cephalosporin use.   . Latex Shortness Of Breath    Antimicrobials this admission: 9/4 rocephin >> x1 9/4 zmax >> x1 9/5 cefepime >> 9/5 vancomycin >>  Dose adjustments this admission:   Microbiology results:  BCx:   UCx:    Sputum:    MRSA PCR:   Thank you for allowing pharmacy to be a part of this patient's care.  Dorrene German 05/30/2017 12:59 AM

## 2017-05-30 NOTE — Progress Notes (Addendum)
CRITICAL VALUE ALERT  Critical Value:  Lactic acid 2.6  Date & Time Notied:  05/30/17  Provider Notified: Dr. Myna Hidalgo  Orders Received/Actions taken: waiting for cb

## 2017-05-30 NOTE — Progress Notes (Signed)
Pharmacy IV to PO conversion  The patient is ordered Thiamine by the intravenous route with a linked PO option available.  Based on criteria approved by the Pharmacy and [place], the IV option is being discontinued.   No active GI bleeding or impaired absorption  Not s/p esophagectomy  Documented ability to take oral medications for > 24 hr  Plan to continue treatment for at least 1 day  If you have any questions about this conversion, please contact the Pharmacy Department (ext [phone]).  Thank you.  Reuel Boom, PharmD Pager: [phone] 05/30/2017, 10:45 AM

## 2017-05-30 NOTE — Care Management Note (Signed)
Case Management Note  Patient Details  Name: [name] Herring MRN: [id number] Date of Birth: Apr 19, 1961  Subjective/Objective:                  Sepsis/low wbc/resp. Congestion.  Action/Plan: Date:  May 30, 2017 Chart reviewed for concurrent status and case management needs. Will continue to follow patient progress. Discharge Planning: following for needs Expected discharge date: [id number] Velva Harman, BSN, [place], [place]  Expected Discharge Date:   (unknown)               Expected Discharge Plan:  Home/Self Care  In-House Referral:     Discharge planning Services  CM Consult  Post Acute Care Choice:    Choice offered to:     DME Arranged:    DME Agency:     HH Arranged:    [place] Agency:     Status of Service:  In process, will continue to follow  If discussed at Long Length of Stay Meetings, dates discussed:    Additional Comments:  Leeroy Cha, RN 05/30/2017, 8:36 AM

## 2017-05-30 NOTE — Progress Notes (Signed)
Callback from Dr. Myna Hidalgo regarding Lactic. States numbers are trending down. No new orders, will monitor for now.

## 2017-05-31 DIAGNOSIS — J189 Pneumonia, unspecified organism: Secondary | ICD-10-CM

## 2017-05-31 DIAGNOSIS — B20 Human immunodeficiency virus [HIV] disease: Secondary | ICD-10-CM

## 2017-05-31 DIAGNOSIS — I10 Essential (primary) hypertension: Secondary | ICD-10-CM

## 2017-05-31 MED ORDER — SULFAMETHOXAZOLE-TRIMETHOPRIM 800-160 MG PO TABS: 1.0000 | ORAL_TABLET | Freq: Every day | ORAL | 1 refills | Status: DC

## 2017-05-31 MED ORDER — CLINDAMYCIN HCL 300 MG PO CAPS: 600.0000 mg | ORAL_CAPSULE | Freq: Three times a day (TID) | ORAL | 0 refills | Status: AC

## 2017-05-31 MED ORDER — SULFAMETHOXAZOLE-TRIMETHOPRIM 800-160 MG PO TABS
1.0000 | ORAL_TABLET | Freq: Two times a day (BID) | ORAL | 1 refills | Status: DC
Start: 2017-05-31 — End: 2017-05-31

## 2017-05-31 MED ORDER — FLUCONAZOLE 100 MG PO TABS: 100.0000 mg | ORAL_TABLET | ORAL | 0 refills | Status: AC

## 2017-05-31 NOTE — Progress Notes (Signed)
Date: May 31, 2017 Chart reviewed for discharge orders: None found for case management. Vernia Buff, [phone]

## 2017-05-31 NOTE — Discharge Summary (Addendum)
Physician Discharge Summary  [name]:[id number] DOB: 01-18-1961 DOA: 05/29/2017  PCP: Arnoldo Morale, MD  Admit date: 05/29/2017 Discharge date: 05/31/2017  Admitted From: home Disposition:  home  Recommendations for Outpatient Follow-up:  1. Follow up with PCP in 1-2 weeks  Home Health: none  Equipment/Devices: none  Discharge Condition: stable CODE STATUS: Full code Diet recommendation: regular  HPI: Per Dr. Lorin Mercy, [name] Herring is a 56 y.o. male with medical history significant of schizophrenia, PE, HIV, and HTN presenting with "I got pneumonia".  Sweats, vomiting.  Symptoms started about 4 days ago.  +cough, nonproductive.  +SOB with exertion.  No fevers.  Diffuse chest pain for months.  HIV+, sees Dr. Johnnye Sima every 6 months.  VL 152 in 2/18, CD4 290 in 2/18. ED Course: Ran out of COPD inhalers 4 days ago.  Lactate 7.7 with basilar opacity on CXR concerning for PNA.  Sepsis protocol, Rocephin, Azithro.  2L IVF.  Well-appearing with improved BP and HR.   Hospital Course: Discharge Diagnoses:  Principal Problem:   Pneumonia Active Problems:   Human immunodeficiency virus (HIV) disease ([place])   Hypothyroidism   Alcohol use disorder, severe, dependence ([place])   HTN (hypertension)   Tobacco abuse   PE (pulmonary thromboembolism) ([facility])   Elevated lactic acid level   Aspiration pneumonia -Patient with weeklong nausea vomiting in the setting of alcohol abuse, he developed cough after that, very high probability that this aspiration pneumonia.  He was placed on clindamycin, respiratory status improved and returned to baseline, his cough has improved, and he was converted to oral agents and discharged home in stable condition. Alcohol abuse -Appears to be heavy drinker, however states he does not drink everyday.  He is not triggering CIWA.  Counseled for cessation HIV infection -CD4 count low at 50, likely in the setting of noncompliance as patient was out of his medications  for several months.  He got them refilled recently and states that he currently is taking his HIV meds.  Will place on Bactrim and fluconazole for prophylaxis, discussed over the phone with Dr. Wendie Simmer.  Patient states he will follow up with Dr. Johnnye Sima this month History of cocaine abuse -Denies recent use History of PE -Hospitalized for submassive PE right heart strain in February 2018, also had an IVC filter placed.  Continue Eliquis Tobacco abuse -Encouraged cessation  Discharge Instructions   Allergies as of 05/31/2017      Reactions   Amoxicillin Shortness Of Breath, Rash   Has patient had a PCN reaction causing immediate rash, facial/tongue/throat swelling, [name] Has patient had a PCN reaction causing severe rash involving mucus membranes or skin necrosis: no Has patient had a PCN reaction that required hospitalization: yes drs office visit Has patient had a PCN reaction occurring within the last 10 years: no If all of the above answers are "NO", then may proceed with Cephalosporin use.   Latex Shortness Of Breath      Medication List    TAKE these medications   acetaminophen 500 MG tablet Commonly known as:  TYLENOL Take 1 tablet (500 mg total) by mouth every 6 (six) hours as needed for moderate pain.   benztropine 1 MG tablet Commonly known as:  COGENTIN 1 tab in the morning 2 tabs at night   clindamycin 300 MG capsule Commonly known as:  CLEOCIN Take 2 capsules (600 mg total) by mouth 3 (three) times daily.   ELIQUIS 2.5 MG Tabs tablet  Generic drug:  apixaban TAKE ONE TABLET BY MOUTH TWICE DAILY   elvitegravir-cobicistat-emtricitabine-tenofovir 150-150-200-10 MG Tabs tablet Commonly known as:  GENVOYA TAKE 1 TABLET BY MOUTH DAILY WITH BREAKFAST. *CALL [phone] to see Dr Johnnye Sima ASAP*   fluconazole 100 MG tablet Commonly known as:  DIFLUCAN Take 1 tablet (100 mg total) by mouth once a week.   gabapentin 600 MG  tablet Commonly known as:  NEURONTIN Take 600 mg by mouth at bedtime.   haloperidol 10 MG tablet Commonly known as:  HALDOL Take 10 mg by mouth 2 (two) times daily.   ibuprofen 200 MG tablet Commonly known as:  ADVIL,MOTRIN Take 400 mg by mouth every 6 (six) hours as needed for moderate pain.   INGREZZA 80 MG Caps Generic drug:  Valbenazine Tosylate Take 80 mg by mouth 2 (two) times daily.   lisinopril 10 MG tablet Commonly known as:  PRINIVIL,ZESTRIL Take 1 tablet (10 mg total) by mouth daily.   magnesium oxide 400 (241.3 Mg) MG tablet Commonly known as:  MAG-OX Take 1 tablet (400 mg total) by mouth 2 (two) times daily.   methocarbamol 750 MG tablet Commonly known as:  ROBAXIN-750 Take 1 tablet (750 mg total) by mouth every 8 (eight) hours as needed for muscle spasms.   metoprolol tartrate 25 MG tablet Commonly known as:  LOPRESSOR Take 1 tablet (25 mg total) by mouth 2 (two) times daily.   mirtazapine 15 MG tablet Commonly known as:  REMERON Take 15 mg by mouth at bedtime.   multivitamin with minerals Tabs tablet Take 1 tablet by mouth daily.   ondansetron 4 MG tablet Commonly known as:  ZOFRAN Take 1 tablet (4 mg total) by mouth every 6 (six) hours.   potassium chloride SA 20 MEQ tablet Commonly known as:  K-DUR,KLOR-CON TAKE ONE TABLET BY MOUTH THREE TIMES DAILY   sulfamethoxazole-trimethoprim 800-160 MG tablet Commonly known as:  BACTRIM DS Take 1 tablet by mouth daily.   tiotropium 18 MCG inhalation capsule Commonly known as:  SPIRIVA Place 1 capsule (18 mcg total) into inhaler and inhale daily.            Discharge Care Instructions        Start     Ordered   05/31/17 0000  clindamycin (CLEOCIN) 300 MG capsule  3 times daily     05/31/17 0917   05/31/17 0000  fluconazole (DIFLUCAN) 100 MG tablet  Weekly     05/31/17 0943   05/31/17 0000  sulfamethoxazole-trimethoprim (BACTRIM DS) 800-160 MG tablet  Daily     05/31/17 1328     Follow-up  Information    Campbell Riches, MD. Schedule an appointment as soon as possible for a visit in 3 week(s).   Specialty:  Infectious Diseases Contact information: [address] [place] [address] [phone]          Allergies  Allergen Reactions  . Amoxicillin Shortness Of Breath and Rash    Has patient had a PCN reaction causing immediate rash, facial/tongue/throat swelling, [name] Has patient had a PCN reaction causing severe rash involving mucus membranes or skin necrosis: no Has patient had a PCN reaction that required hospitalization: yes drs office visit Has patient had a PCN reaction occurring within the last 10 years: no If all of the above answers are "NO", then may proceed with Cephalosporin use.   . Latex Shortness Of Breath    Consultations:  None   Procedures/Studies:  Dg Chest 2 View  Result Date: 05/29/2017 CLINICAL DATA:  Shortness of breath, cough, nausea, vomiting, and diaphoresis for 5 days. EXAM: CHEST  2 VIEW COMPARISON:  12/02/2016 FINDINGS: The cardiac silhouette appears mildly enlarged, accentuated by AP projection. The lungs are slightly less well inflated than on the prior study, and there is mild opacity in the right greater than left lung bases. No edema, pleural effusion, or pneumothorax is identified. Underlying emphysematous changes are again noted in the upper lobes. No acute osseous abnormality is seen. IMPRESSION: Mild bibasilar opacity, likely atelectasis. Electronically Signed   By: Logan Bores M.D.   On: 05/29/2017 18:02     Subjective: - no chest pain, shortness of breath, no abdominal pain, nausea or vomiting.   Discharge Exam: Vitals:   05/31/17 0748 05/31/17 0840  BP:  (!) 140/98  Pulse:  73  Resp:  20  Temp:  98.3 F (36.8 C)  SpO2: 90% 95%   Vitals:   05/30/17 2300 05/31/17 0616 05/31/17 0748 05/31/17 0840  BP: (!) 157/99 (!) 159/97  (!) 140/98  Pulse: (!) 58 (!) 50   73  Resp: 18 18  20   Temp: 98.3 F (36.8 C) 98 F (36.7 C)  98.3 F (36.8 C)  TempSrc: Oral Oral  Oral  SpO2: 95% 95% 90% 95%  Weight:      Height:        General: Pt is alert, awake, not in acute distress Cardiovascular: RRR, S1/S2 +, no rubs, no gallops Respiratory: CTA bilaterally, no wheezing, no rhonchi Abdominal: Soft, NT, ND, bowel sounds +   The results of significant diagnostics from this hospitalization (including imaging, microbiology, ancillary and laboratory) are listed below for reference.     Microbiology: Recent Results (from the past 240 hour(s))  Blood Culture (routine x 2)     Status: None (Preliminary result)   Collection Time: 05/29/17  6:29 PM  Result Value Ref Range Status   Specimen Description BLOOD RIGHT FOREARM  Final   Special Requests   Final    BOTTLES DRAWN AEROBIC AND ANAEROBIC Blood Culture adequate volume   Culture   Final    NO GROWTH 2 DAYS Performed at [facility], [address], [place], [place] [address]    Report Status PENDING  Incomplete  Blood Culture (routine x 2)     Status: None (Preliminary result)   Collection Time: 05/29/17  6:31 PM  Result Value Ref Range Status   Specimen Description BLOOD LEFT HAND  Final   Special Requests   Final    BOTTLES DRAWN AEROBIC AND ANAEROBIC Blood Culture adequate volume   Culture   Final    NO GROWTH 2 DAYS Performed at [facility], [place] [address], [place], [place] [address]    Report Status PENDING  Incomplete     Labs: BNP (last 3 results)  Recent Labs  11/03/16 0342 11/25/16 2105  BNP 45.4 44.3   Basic Metabolic Panel:  Recent Labs Lab 05/29/17 1753 05/30/17 0119  NA 139 139  K 3.3* 3.7  CL 104 107  CO2 18* 23  GLUCOSE 82 160*  BUN 14 12  CREATININE 1.13 0.81  CALCIUM 9.5 9.2   Liver Function Tests:  Recent Labs Lab 05/29/17 1753  AST 39  ALT 18  ALKPHOS 55  BILITOT 0.9  PROT 6.7  ALBUMIN 3.2*    Recent Labs Lab 05/29/17 1753    LIPASE 18   No results for input(s):  AMMONIA in the last 168 hours. CBC:  Recent Labs Lab 05/29/17 1753 05/30/17 0119  WBC 6.7 3.4*  NEUTROABS 4.5 2.9  HGB 11.0* 11.2*  HCT 32.7* 33.7*  MCV 87.4 87.8  PLT 154 124*   Cardiac Enzymes: No results for input(s): CKTOTAL, CKMB, CKMBINDEX, TROPONINI in the last 168 hours. BNP: Invalid input(s): POCBNP CBG: No results for input(s): GLUCAP in the last 168 hours. D-Dimer No results for input(s): DDIMER in the last 72 hours. Hgb A1c No results for input(s): HGBA1C in the last 72 hours. Lipid Profile No results for input(s): CHOL, HDL, LDLCALC, TRIG, CHOLHDL, LDLDIRECT in the last 72 hours. Thyroid function studies  Recent Labs  05/30/17 0119  TSH 0.439   Anemia work up No results for input(s): VITAMINB12, FOLATE, FERRITIN, TIBC, IRON, RETICCTPCT in the last 72 hours. Urinalysis    Component Value Date/Time   COLORURINE YELLOW 05/29/2017 1832   APPEARANCEUR CLEAR 05/29/2017 1832   LABSPEC 1.006 05/29/2017 1832   PHURINE 6.0 05/29/2017 1832   GLUCOSEU NEGATIVE 05/29/2017 1832   GLUCOSEU NEG mg/dL 10/11/2006 2016   HGBUR NEGATIVE 05/29/2017 1832   BILIRUBINUR NEGATIVE 05/29/2017 1832   KETONESUR NEGATIVE 05/29/2017 1832   PROTEINUR NEGATIVE 05/29/2017 1832   UROBILINOGEN 1 10/11/2006 2016   NITRITE NEGATIVE 05/29/2017 1832   LEUKOCYTESUR NEGATIVE 05/29/2017 1832   Sepsis Labs Invalid input(s): PROCALCITONIN,  WBC,  LACTICIDVEN Microbiology Recent Results (from the past 240 hour(s))  Blood Culture (routine x 2)     Status: None (Preliminary result)   Collection Time: 05/29/17  6:29 PM  Result Value Ref Range Status   Specimen Description BLOOD RIGHT FOREARM  Final   Special Requests   Final    BOTTLES DRAWN AEROBIC AND ANAEROBIC Blood Culture adequate volume   Culture   Final    NO GROWTH 2 DAYS Performed at [facility], [employer] [address] [place], [place] [address]    Report Status PENDING  Incomplete   Blood Culture (routine x 2)     Status: None (Preliminary result)   Collection Time: 05/29/17  6:31 PM  Result Value Ref Range Status   Specimen Description BLOOD LEFT HAND  Final   Special Requests   Final    BOTTLES DRAWN AEROBIC AND ANAEROBIC Blood Culture adequate volume   Culture   Final    NO GROWTH 2 DAYS Performed at [facility], [place] [address] [place],  [address]    Report Status PENDING  Incomplete     Time coordinating discharge: 35 minutes  SIGNED:  Marzetta Board, MD  [facility] 05/31/2017, 1:28 PM Pager [phone]  If 7PM-7AM, please contact night-coverage [email or web address] Password [email or web address]

## 2017-05-31 NOTE — Progress Notes (Signed)
Nutrition Brief Note  Patient identified on the low braden report. Wt noted to be stable. Pt reports no loss in appetite, consuming 3 meals per day.   Wt Readings from Last 15 Encounters:  05/29/17 135 lb (61.2 kg)  03/22/17 129 lb (58.5 kg)  03/20/17 129 lb 6.4 oz (58.7 kg)  01/17/17 138 lb 12.8 oz (63 kg)  12/18/16 140 lb (63.5 kg)  11/20/16 137 lb (62.1 kg)  11/16/16 141 lb (64 kg)  11/13/16 141 lb (64 kg)  11/09/16 138 lb 6.4 oz (62.8 kg)  11/02/16 139 lb 12.4 oz (63.4 kg)  07/16/16 144 lb 9.6 oz (65.6 kg)  08/25/15 151 lb (68.5 kg)  01/13/15 159 lb (72.1 kg)  09/25/14 145 lb (65.8 kg)  09/03/07 156 lb 3.2 oz (70.9 kg)    Body mass index is 19.37 kg/m. Patient meets criteria for normal based on current BMI.   Current diet order is regular, patient is consuming approximately 100% of meals at this time. Labs and medications reviewed.   No nutrition interventions warranted at this time. If nutrition issues arise, please consult RD.   Mariana Single RD, LDN Clinical Nutrition Pager # [phone]

## 2017-06-01 LAB — HIV-1 RNA ULTRAQUANT REFLEX TO GENTYP+
HIV-1 RNA BY PCR: 30 copies/mL
HIV-1 RNA Quant, Log: 1.477 log10copy/mL

## 2017-06-03 LAB — CULTURE, BLOOD (ROUTINE X 2)
CULTURE: NO GROWTH
Culture: NO GROWTH
SPECIAL REQUESTS: ADEQUATE
Special Requests: ADEQUATE

## 2017-06-12 ENCOUNTER — Encounter (HOSPITAL_COMMUNITY): Payer: Self-pay | Admitting: Emergency Medicine

## 2017-06-12 ENCOUNTER — Emergency Department (HOSPITAL_COMMUNITY): Payer: Medicare Other

## 2017-06-12 ENCOUNTER — Inpatient Hospital Stay (HOSPITAL_COMMUNITY)
Admission: EM | Admit: 2017-06-12 | Discharge: 2017-06-18 | DRG: 377 | Disposition: A | Discharge status: Home Health | Payer: Medicare Other | Attending: Internal Medicine | Admitting: Internal Medicine

## 2017-06-12 ENCOUNTER — Inpatient Hospital Stay (HOSPITAL_COMMUNITY): Payer: Medicare Other

## 2017-06-12 DIAGNOSIS — K5900 Constipation, unspecified: Secondary | ICD-10-CM | POA: Diagnosis not present

## 2017-06-12 DIAGNOSIS — R0602 Shortness of breath: Secondary | ICD-10-CM | POA: Diagnosis not present

## 2017-06-12 DIAGNOSIS — Z7189 Other specified counseling: Secondary | ICD-10-CM

## 2017-06-12 DIAGNOSIS — F17210 Nicotine dependence, cigarettes, uncomplicated: Secondary | ICD-10-CM | POA: Diagnosis present

## 2017-06-12 DIAGNOSIS — E43 Unspecified severe protein-calorie malnutrition: Secondary | ICD-10-CM | POA: Diagnosis present

## 2017-06-12 DIAGNOSIS — Z7901 Long term (current) use of anticoagulants: Secondary | ICD-10-CM | POA: Diagnosis not present

## 2017-06-12 DIAGNOSIS — K2920 Alcoholic gastritis without bleeding: Secondary | ICD-10-CM | POA: Diagnosis present

## 2017-06-12 DIAGNOSIS — W19XXXA Unspecified fall, initial encounter: Secondary | ICD-10-CM

## 2017-06-12 DIAGNOSIS — Z791 Long term (current) use of non-steroidal anti-inflammatories (NSAID): Secondary | ICD-10-CM | POA: Diagnosis not present

## 2017-06-12 DIAGNOSIS — K264 Chronic or unspecified duodenal ulcer with hemorrhage: Secondary | ICD-10-CM | POA: Diagnosis present

## 2017-06-12 DIAGNOSIS — E860 Dehydration: Secondary | ICD-10-CM | POA: Diagnosis present

## 2017-06-12 DIAGNOSIS — Z880 Allergy status to penicillin: Secondary | ICD-10-CM

## 2017-06-12 DIAGNOSIS — Z515 Encounter for palliative care: Secondary | ICD-10-CM | POA: Diagnosis present

## 2017-06-12 DIAGNOSIS — Z86711 Personal history of pulmonary embolism: Secondary | ICD-10-CM

## 2017-06-12 DIAGNOSIS — R069 Unspecified abnormalities of breathing: Secondary | ICD-10-CM | POA: Diagnosis not present

## 2017-06-12 DIAGNOSIS — F200 Paranoid schizophrenia: Secondary | ICD-10-CM | POA: Diagnosis present

## 2017-06-12 DIAGNOSIS — D123 Benign neoplasm of transverse colon: Secondary | ICD-10-CM | POA: Diagnosis present

## 2017-06-12 DIAGNOSIS — Z66 Do not resuscitate: Secondary | ICD-10-CM | POA: Diagnosis present

## 2017-06-12 DIAGNOSIS — R079 Chest pain, unspecified: Secondary | ICD-10-CM | POA: Diagnosis not present

## 2017-06-12 DIAGNOSIS — E039 Hypothyroidism, unspecified: Secondary | ICD-10-CM | POA: Diagnosis present

## 2017-06-12 DIAGNOSIS — K269 Duodenal ulcer, unspecified as acute or chronic, without hemorrhage or perforation: Secondary | ICD-10-CM | POA: Diagnosis not present

## 2017-06-12 DIAGNOSIS — I2699 Other pulmonary embolism without acute cor pulmonale: Secondary | ICD-10-CM | POA: Diagnosis present

## 2017-06-12 DIAGNOSIS — F1021 Alcohol dependence, in remission: Secondary | ICD-10-CM | POA: Diagnosis present

## 2017-06-12 DIAGNOSIS — R1084 Generalized abdominal pain: Secondary | ICD-10-CM | POA: Diagnosis not present

## 2017-06-12 DIAGNOSIS — K922 Gastrointestinal hemorrhage, unspecified: Secondary | ICD-10-CM | POA: Insufficient documentation

## 2017-06-12 DIAGNOSIS — Z681 Body mass index (BMI) 19 or less, adult: Secondary | ICD-10-CM

## 2017-06-12 DIAGNOSIS — R627 Adult failure to thrive: Secondary | ICD-10-CM | POA: Diagnosis present

## 2017-06-12 DIAGNOSIS — K921 Melena: Secondary | ICD-10-CM

## 2017-06-12 DIAGNOSIS — R112 Nausea with vomiting, unspecified: Secondary | ICD-10-CM | POA: Diagnosis present

## 2017-06-12 DIAGNOSIS — Z720 Tobacco use: Secondary | ICD-10-CM | POA: Diagnosis present

## 2017-06-12 DIAGNOSIS — Z79899 Other long term (current) drug therapy: Secondary | ICD-10-CM | POA: Diagnosis not present

## 2017-06-12 DIAGNOSIS — F1020 Alcohol dependence, uncomplicated: Secondary | ICD-10-CM | POA: Diagnosis present

## 2017-06-12 DIAGNOSIS — B20 Human immunodeficiency virus [HIV] disease: Secondary | ICD-10-CM | POA: Diagnosis present

## 2017-06-12 DIAGNOSIS — R0789 Other chest pain: Secondary | ICD-10-CM | POA: Diagnosis not present

## 2017-06-12 DIAGNOSIS — I10 Essential (primary) hypertension: Secondary | ICD-10-CM | POA: Diagnosis present

## 2017-06-12 DIAGNOSIS — I361 Nonrheumatic tricuspid (valve) insufficiency: Secondary | ICD-10-CM | POA: Diagnosis not present

## 2017-06-12 DIAGNOSIS — D689 Coagulation defect, unspecified: Secondary | ICD-10-CM | POA: Diagnosis not present

## 2017-06-12 DIAGNOSIS — K625 Hemorrhage of anus and rectum: Secondary | ICD-10-CM | POA: Diagnosis not present

## 2017-06-12 DIAGNOSIS — N179 Acute kidney failure, unspecified: Secondary | ICD-10-CM | POA: Diagnosis not present

## 2017-06-12 DIAGNOSIS — I82403 Acute embolism and thrombosis of unspecified deep veins of lower extremity, bilateral: Secondary | ICD-10-CM | POA: Diagnosis present

## 2017-06-12 DIAGNOSIS — J439 Emphysema, unspecified: Secondary | ICD-10-CM | POA: Diagnosis present

## 2017-06-12 DIAGNOSIS — F32A Depression, unspecified: Secondary | ICD-10-CM | POA: Diagnosis present

## 2017-06-12 DIAGNOSIS — R935 Abnormal findings on diagnostic imaging of other abdominal regions, including retroperitoneum: Secondary | ICD-10-CM | POA: Diagnosis not present

## 2017-06-12 DIAGNOSIS — Z86718 Personal history of other venous thrombosis and embolism: Secondary | ICD-10-CM | POA: Diagnosis not present

## 2017-06-12 DIAGNOSIS — K6289 Other specified diseases of anus and rectum: Secondary | ICD-10-CM | POA: Diagnosis not present

## 2017-06-12 DIAGNOSIS — R197 Diarrhea, unspecified: Secondary | ICD-10-CM

## 2017-06-12 DIAGNOSIS — R634 Abnormal weight loss: Secondary | ICD-10-CM

## 2017-06-12 DIAGNOSIS — D62 Acute posthemorrhagic anemia: Secondary | ICD-10-CM | POA: Diagnosis present

## 2017-06-12 DIAGNOSIS — F329 Major depressive disorder, single episode, unspecified: Secondary | ICD-10-CM | POA: Diagnosis present

## 2017-06-12 DIAGNOSIS — S199XXA Unspecified injury of neck, initial encounter: Secondary | ICD-10-CM | POA: Diagnosis not present

## 2017-06-12 DIAGNOSIS — S0990XA Unspecified injury of head, initial encounter: Secondary | ICD-10-CM | POA: Diagnosis not present

## 2017-06-12 LAB — CBC WITH DIFFERENTIAL/PLATELET
BASOS PCT: 0 %
Basophils Absolute: 0 10*3/uL (ref 0.0–0.1)
EOS PCT: 0 %
Eosinophils Absolute: 0 10*3/uL (ref 0.0–0.7)
HCT: 23.6 % — ABNORMAL LOW (ref 39.0–52.0)
HEMOGLOBIN: 8.1 g/dL — AB (ref 13.0–17.0)
Lymphocytes Relative: 12 %
Lymphs Abs: 1.4 10*3/uL (ref 0.7–4.0)
MCH: 30.6 pg (ref 26.0–34.0)
MCHC: 34.3 g/dL (ref 30.0–36.0)
MCV: 89.1 fL (ref 78.0–100.0)
MONOS PCT: 4 %
Monocytes Absolute: 0.5 10*3/uL (ref 0.1–1.0)
Neutro Abs: 9.7 10*3/uL — ABNORMAL HIGH (ref 1.7–7.7)
Neutrophils Relative %: 84 %
PLATELETS: 159 10*3/uL (ref 150–400)
RBC: 2.65 MIL/uL — ABNORMAL LOW (ref 4.22–5.81)
RDW: 20.2 % — ABNORMAL HIGH (ref 11.5–15.5)
WBC: 11.6 10*3/uL — ABNORMAL HIGH (ref 4.0–10.5)

## 2017-06-12 LAB — MRSA PCR SCREENING: MRSA by PCR: NEGATIVE

## 2017-06-12 LAB — CBC
HCT: 21.5 % — ABNORMAL LOW (ref 39.0–52.0)
HCT: 17.5 % — ABNORMAL LOW (ref 39.0–52.0)
HEMOGLOBIN: 7.4 g/dL — AB (ref 13.0–17.0)
Hemoglobin: 6.1 g/dL — CL (ref 13.0–17.0)
MCH: 30.5 pg (ref 26.0–34.0)
MCH: 30.8 pg (ref 26.0–34.0)
MCHC: 34.4 g/dL (ref 30.0–36.0)
MCHC: 34.9 g/dL (ref 30.0–36.0)
MCV: 88.5 fL (ref 78.0–100.0)
MCV: 88.4 fL (ref 78.0–100.0)
PLATELETS: 125 10*3/uL — AB (ref 150–400)
Platelets: 151 10*3/uL (ref 150–400)
RBC: 2.43 MIL/uL — ABNORMAL LOW (ref 4.22–5.81)
RBC: 1.98 MIL/uL — AB (ref 4.22–5.81)
RDW: 20.3 % — AB (ref 11.5–15.5)
RDW: 20.4 % — AB (ref 11.5–15.5)
WBC: 10.5 10*3/uL (ref 4.0–10.5)
WBC: 6.8 10*3/uL (ref 4.0–10.5)

## 2017-06-12 LAB — BASIC METABOLIC PANEL
Anion gap: 9 (ref 5–15)
BUN: 50 mg/dL — AB (ref 6–20)
CALCIUM: 9.1 mg/dL (ref 8.9–10.3)
CHLORIDE: 105 mmol/L (ref 101–111)
CO2: 22 mmol/L (ref 22–32)
CREATININE: 1.73 mg/dL — AB (ref 0.61–1.24)
GFR, EST AFRICAN AMERICAN: 49 mL/min — AB (ref >60)
GFR, EST NON AFRICAN AMERICAN: 43 mL/min — AB (ref >60)
Glucose, Bld: 145 mg/dL — ABNORMAL HIGH (ref 65–99)
Potassium: 4.2 mmol/L (ref 3.5–5.1)
SODIUM: 136 mmol/L (ref 135–145)

## 2017-06-12 LAB — CREATININE, URINE, RANDOM: CREATININE, URINE: 293.12 mg/dL

## 2017-06-12 LAB — RAPID URINE DRUG SCREEN, HOSP PERFORMED
Amphetamines: NOT DETECTED
BARBITURATES: NOT DETECTED
Benzodiazepines: NOT DETECTED
Cocaine: NOT DETECTED
Opiates: NOT DETECTED
Tetrahydrocannabinol: NOT DETECTED

## 2017-06-12 LAB — PREPARE RBC (CROSSMATCH)

## 2017-06-12 LAB — ABO/RH: ABO/RH(D): A POS

## 2017-06-12 LAB — TROPONIN I

## 2017-06-12 LAB — PROTIME-INR
INR: 1.03
PROTHROMBIN TIME: 13.4 s (ref 11.4–15.2)

## 2017-06-12 LAB — SODIUM, URINE, RANDOM: Sodium, Ur: 18 mmol/L

## 2017-06-12 LAB — APTT: APTT: 23 s — AB (ref 24–36)

## 2017-06-12 LAB — CBG MONITORING, ED: Glucose-Capillary: 117 mg/dL — ABNORMAL HIGH (ref 65–99)

## 2017-06-12 MED ORDER — HYDRALAZINE HCL 20 MG/ML IJ SOLN: 5.0000 mg | INTRAMUSCULAR | Status: DC | PRN

## 2017-06-12 MED ORDER — OXYCODONE HCL 5 MG PO TABS
10.0000 mg | ORAL_TABLET | Freq: Four times a day (QID) | ORAL | Status: DC | PRN
  Administered 2017-06-13: 10 mg via ORAL
  Filled 2017-06-12: qty 2

## 2017-06-12 MED ORDER — ONDANSETRON HCL 4 MG/2ML IJ SOLN: 4.0000 mg | Freq: Four times a day (QID) | INTRAMUSCULAR | Status: DC | PRN

## 2017-06-12 MED ORDER — SODIUM CHLORIDE 0.9 % IV SOLN
Freq: Once | INTRAVENOUS | Status: AC
  Administered 2017-06-12: 13:00:00 via INTRAVENOUS

## 2017-06-12 MED ORDER — TIOTROPIUM BROMIDE MONOHYDRATE 18 MCG IN CAPS
18.0000 ug | ORAL_CAPSULE | Freq: Every day | RESPIRATORY_TRACT | Status: DC
  Administered 2017-06-13 – 2017-06-18 (×6): 18 ug via RESPIRATORY_TRACT
  Filled 2017-06-12 (×2): qty 5

## 2017-06-12 MED ORDER — BENZTROPINE MESYLATE 0.5 MG PO TABS
1.0000 mg | ORAL_TABLET | Freq: Every day | ORAL | Status: DC
  Administered 2017-06-12 – 2017-06-13 (×2): 1 mg via ORAL
  Filled 2017-06-12 (×2): qty 2

## 2017-06-12 MED ORDER — LORAZEPAM 1 MG PO TABS
1.0000 mg | ORAL_TABLET | Freq: Four times a day (QID) | ORAL | Status: DC | PRN
Start: 2017-06-12 — End: 2017-06-15

## 2017-06-12 MED ORDER — LORAZEPAM 2 MG/ML IJ SOLN
0.0000 mg | Freq: Four times a day (QID) | INTRAMUSCULAR | Status: AC
Start: 2017-06-12 — End: 2017-06-14

## 2017-06-12 MED ORDER — DM-GUAIFENESIN ER 30-600 MG PO TB12
1.0000 | ORAL_TABLET | Freq: Two times a day (BID) | ORAL | Status: DC
  Administered 2017-06-12 – 2017-06-18 (×12): 1 via ORAL
  Filled 2017-06-12 (×12): qty 1

## 2017-06-12 MED ORDER — SODIUM CHLORIDE 0.9 % IV SOLN: Freq: Once | INTRAVENOUS | Status: AC

## 2017-06-12 MED ORDER — LORAZEPAM 2 MG/ML IJ SOLN
1.0000 mg | Freq: Four times a day (QID) | INTRAMUSCULAR | Status: DC | PRN
  Administered 2017-06-12: 1 mg via INTRAVENOUS
  Filled 2017-06-12: qty 1

## 2017-06-12 MED ORDER — MAGNESIUM OXIDE 400 (241.3 MG) MG PO TABS
400.0000 mg | ORAL_TABLET | Freq: Two times a day (BID) | ORAL | Status: DC
  Administered 2017-06-12 – 2017-06-18 (×12): 400 mg via ORAL
  Filled 2017-06-12 (×13): qty 1

## 2017-06-12 MED ORDER — ONDANSETRON HCL 4 MG PO TABS: 4.0000 mg | ORAL_TABLET | Freq: Four times a day (QID) | ORAL | Status: DC | PRN

## 2017-06-12 MED ORDER — VALBENAZINE TOSYLATE 80 MG PO CAPS: 80.0000 mg | ORAL_CAPSULE | Freq: Two times a day (BID) | ORAL | Status: DC

## 2017-06-12 MED ORDER — PANTOPRAZOLE SODIUM 40 MG IV SOLR
80.0000 mg | Freq: Once | INTRAVENOUS | Status: AC
  Administered 2017-06-12: 06:00:00 80 mg via INTRAVENOUS
  Filled 2017-06-12: qty 80

## 2017-06-12 MED ORDER — ALBUTEROL SULFATE (2.5 MG/3ML) 0.083% IN NEBU: 2.5000 mg | INHALATION_SOLUTION | RESPIRATORY_TRACT | Status: DC | PRN

## 2017-06-12 MED ORDER — FLUCONAZOLE 100 MG PO TABS
100.0000 mg | ORAL_TABLET | ORAL | Status: DC
  Administered 2017-06-13: 100 mg via ORAL
  Filled 2017-06-12 (×2): qty 1

## 2017-06-12 MED ORDER — ADULT MULTIVITAMIN W/MINERALS CH: 1.0000 | ORAL_TABLET | Freq: Every day | ORAL | Status: DC

## 2017-06-12 MED ORDER — FOLIC ACID 1 MG PO TABS
1.0000 mg | ORAL_TABLET | Freq: Every day | ORAL | Status: DC
  Administered 2017-06-12 – 2017-06-18 (×6): 1 mg via ORAL
  Filled 2017-06-12 (×6): qty 1

## 2017-06-12 MED ORDER — GABAPENTIN 300 MG PO CAPS
600.0000 mg | ORAL_CAPSULE | Freq: Every day | ORAL | Status: DC
  Administered 2017-06-12 – 2017-06-17 (×6): 600 mg via ORAL
  Filled 2017-06-12 (×6): qty 2

## 2017-06-12 MED ORDER — LORAZEPAM 2 MG/ML IJ SOLN
0.0000 mg | Freq: Two times a day (BID) | INTRAMUSCULAR | Status: AC
Start: 2017-06-14 — End: 2017-06-16

## 2017-06-12 MED ORDER — METHOCARBAMOL 500 MG PO TABS: 750.0000 mg | ORAL_TABLET | Freq: Three times a day (TID) | ORAL | Status: DC | PRN

## 2017-06-12 MED ORDER — NICOTINE 21 MG/24HR TD PT24
21.0000 mg | MEDICATED_PATCH | Freq: Every day | TRANSDERMAL | Status: DC
  Administered 2017-06-12 – 2017-06-18 (×7): 21 mg via TRANSDERMAL
  Filled 2017-06-12 (×7): qty 1

## 2017-06-12 MED ORDER — MIRTAZAPINE 15 MG PO TABS
15.0000 mg | ORAL_TABLET | Freq: Every day | ORAL | Status: DC
  Administered 2017-06-12 – 2017-06-17 (×6): 15 mg via ORAL
  Filled 2017-06-12 (×6): qty 1

## 2017-06-12 MED ORDER — ACETAMINOPHEN 500 MG PO TABS
500.0000 mg | ORAL_TABLET | Freq: Four times a day (QID) | ORAL | Status: DC | PRN
  Administered 2017-06-13 – 2017-06-16 (×2): 500 mg via ORAL
  Filled 2017-06-12 (×2): qty 1

## 2017-06-12 MED ORDER — METOPROLOL TARTRATE 25 MG PO TABS
25.0000 mg | ORAL_TABLET | Freq: Two times a day (BID) | ORAL | Status: DC
  Administered 2017-06-12 – 2017-06-18 (×9): 25 mg via ORAL
  Filled 2017-06-12 (×11): qty 1

## 2017-06-12 MED ORDER — TECHNETIUM TO 99M ALBUMIN AGGREGATED
4.8000 | Freq: Once | INTRAVENOUS | Status: AC | PRN
  Administered 2017-06-12: 4.8 via INTRAVENOUS

## 2017-06-12 MED ORDER — ADULT MULTIVITAMIN W/MINERALS CH
1.0000 | ORAL_TABLET | Freq: Every day | ORAL | Status: DC
  Administered 2017-06-12 – 2017-06-18 (×6): 1 via ORAL
  Filled 2017-06-12 (×6): qty 1

## 2017-06-12 MED ORDER — PANTOPRAZOLE SODIUM 40 MG IV SOLR
40.0000 mg | Freq: Two times a day (BID) | INTRAVENOUS | Status: DC
  Administered 2017-06-16 – 2017-06-17 (×3): 40 mg via INTRAVENOUS
  Filled 2017-06-12 (×4): qty 40

## 2017-06-12 MED ORDER — THIAMINE HCL 100 MG/ML IJ SOLN
100.0000 mg | Freq: Every day | INTRAMUSCULAR | Status: DC
  Administered 2017-06-12: 100 mg via INTRAVENOUS
  Filled 2017-06-12: qty 2

## 2017-06-12 MED ORDER — HALOPERIDOL 5 MG PO TABS
10.0000 mg | ORAL_TABLET | Freq: Two times a day (BID) | ORAL | Status: DC
  Administered 2017-06-12 – 2017-06-13 (×2): 10 mg via ORAL
  Filled 2017-06-12 (×3): qty 2

## 2017-06-12 MED ORDER — TECHNETIUM TC 99M DIETHYLENETRIAME-PENTAACETIC ACID
28.9000 | Freq: Once | INTRAVENOUS | Status: AC | PRN
  Administered 2017-06-12: 28.9 via INTRAVENOUS

## 2017-06-12 MED ORDER — ELVITEG-COBIC-EMTRICIT-TENOFAF 150-150-200-10 MG PO TABS
1.0000 | ORAL_TABLET | Freq: Every day | ORAL | Status: DC
  Administered 2017-06-12 – 2017-06-18 (×7): 1 via ORAL
  Filled 2017-06-12 (×7): qty 1

## 2017-06-12 MED ORDER — SODIUM CHLORIDE 0.9 % IV SOLN
8.0000 mg/h | INTRAVENOUS | Status: DC
  Administered 2017-06-12 – 2017-06-14 (×6): 8 mg/h via INTRAVENOUS
  Filled 2017-06-12 (×12): qty 80

## 2017-06-12 MED ORDER — SULFAMETHOXAZOLE-TRIMETHOPRIM 800-160 MG PO TABS
1.0000 | ORAL_TABLET | Freq: Every day | ORAL | Status: DC
  Administered 2017-06-12 – 2017-06-18 (×6): 1 via ORAL
  Filled 2017-06-12 (×6): qty 1

## 2017-06-12 MED ORDER — NITROGLYCERIN 0.4 MG SL SUBL: 0.4000 mg | SUBLINGUAL_TABLET | SUBLINGUAL | Status: DC | PRN

## 2017-06-12 MED ORDER — BENZTROPINE MESYLATE 1 MG PO TABS: 1.0000 mg | ORAL_TABLET | Freq: Two times a day (BID) | ORAL | Status: DC

## 2017-06-12 MED ORDER — VITAMIN B-1 100 MG PO TABS
100.0000 mg | ORAL_TABLET | Freq: Every day | ORAL | Status: DC
  Administered 2017-06-13 – 2017-06-18 (×5): 100 mg via ORAL
  Filled 2017-06-12 (×5): qty 1

## 2017-06-12 MED ORDER — ZOLPIDEM TARTRATE 5 MG PO TABS: 5.0000 mg | ORAL_TABLET | Freq: Every evening | ORAL | Status: DC | PRN

## 2017-06-12 MED ORDER — SODIUM CHLORIDE 0.9 % IV SOLN
INTRAVENOUS | Status: DC
  Administered 2017-06-12 – 2017-06-13 (×2): via INTRAVENOUS

## 2017-06-12 MED ORDER — BENZTROPINE MESYLATE 0.5 MG PO TABS
2.0000 mg | ORAL_TABLET | Freq: Every day | ORAL | Status: DC
  Administered 2017-06-12: 2 mg via ORAL
  Filled 2017-06-12: qty 4

## 2017-06-12 MED ORDER — IPRATROPIUM-ALBUTEROL 0.5-2.5 (3) MG/3ML IN SOLN
3.0000 mL | Freq: Once | RESPIRATORY_TRACT | Status: AC
  Administered 2017-06-12: 3 mL via RESPIRATORY_TRACT
  Filled 2017-06-12: qty 3

## 2017-06-12 NOTE — Consult Note (Signed)
Referring Provider: [facility]   Primary Care Physician:  Arnoldo Morale, MD Primary Gastroenterologist:  Althia Forts  Reason for Consultation: GI bleed  ASSESSMENT AND PLAN:   84. 56 yo male with three day history of blood in stool on Eliquis. He is poor historian. Admits to associated rectal discomfort , abdominal discomfort, nausea / vomiting and recent constipation . Gives very little details but is clear that he has no history of GI bleeding. Hgb down several grams from baseline. This could be diverticular bleeding. 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 I found this history in one of Infectious Diseases office notes. Nothing in Care Everywhere about this. Upper Gi bleeding not excluded, especially given daily NSAID use. BUN is elevated but not necessarily out of proportion to Creatinine.  -Given AKI  Eliquis will need more time to clear hs system.  -agree with BID PPI -Will need endoscopic evaluation in a couple of days when Eliquis washes out.  -supportive care for now  2. Anemia of acute blood loss. Hgb 7.1, down from baseline of 11.  -Hospitalist has ordered two units of blood  3. AKi  4. HIV, abs CD4 count of 50  5. Hx of DVT / PE with right heart strain. He has an IVC. On Eliquis  HPI: LEVAR FAYSON is a 56 y.o. male with polysubstance abuse, DVT / PE on Eliquis, HIV (absolute CD4 49) followed by Dr. Johnnye Sima , and schizophrenia. He presented to ED today with SOB, nausea, vomiting, diarrhea, abdominal pain and diarrhea.   ED:  Normotensive, tachycardic , tachypneic with normal 02 sats. . Initial hgb 8.1, now 7.4 which is down from baseline of 11. Normal coags. Troponin normal. No acute findings on CXR. Had VQ scan this am >> low probability for PE.   Patient just arrive to floor. He doesn't offer much history but says he has had blood in stool for 3 days. He had been constipated but now stools are loose. He admits to "some  " rectal discomfort and "some" abdominal discomfort associated with the bleeding. He has had a few episodes of nausea and vomiting. Emesis describes by patient as being dark orange. He has taken daily ibuprofen for years. Takes Pepto but no acid blockers. Admits to decreased appetite and weight loss. Patient says he had a colonoscopy several years ago at [facility]. I can't find any record of that. I did see in ID office note that patient has a history of cancer in situ of rectum in 2011 at [facility] in 2011    Past Medical History:  Diagnosis Date  . Depression   . HIV (human immunodeficiency virus infection) ([place])   . Hypertension   . Immune deficiency disorder ([place])   . PE (pulmonary thromboembolism) ([place]) 2018  . Personality disorder   . Schizophrenia [facility])     Past Surgical History:  Procedure Laterality Date  . IR GENERIC HISTORICAL  11/04/2016   IR IVC FILTER PLMT / S&I Burke Keels GUID/MOD SED 11/04/2016 Aletta Edouard, MD [facility]-INTERV RAD    Prior to Admission medications   Medication Sig Start Date End Date Taking? Authorizing Provider  acetaminophen (TYLENOL) 500 MG tablet Take 1 tablet (500 mg total) by mouth every 6 (six) hours as needed for moderate pain. 11/07/16  Yes Eugenie Filler, MD  benztropine (COGENTIN) 1 MG tablet 1 tab in the morning 2 tabs at night 11/10/16  Yes Argentina Donovan, [place]  ELIQUIS 2.5 MG TABS tablet TAKE ONE TABLET BY MOUTH TWICE DAILY 05/14/17  Yes Amao, Enobong, MD  elvitegravir-cobicistat-emtricitabine-tenofovir (GENVOYA) 150-150-200-10 MG TABS tablet TAKE 1 TABLET BY MOUTH DAILY WITH BREAKFAST. *CALL [phone] to see Dr Johnnye Sima ASAP* 07/17/16  Yes Short, Noah Delaine, MD  fluconazole (DIFLUCAN) 100 MG tablet Take 1 tablet (100 mg total) by mouth once a week. 05/31/17 12/27/17 Yes Gherghe, Vella Redhead, MD  gabapentin (NEURONTIN) 600 MG tablet Take 600 mg by mouth at bedtime.   Yes [provider]  haloperidol (HALDOL) 10 MG tablet Take 10 mg by mouth 2 (two)  times daily.   Yes [provider]  ibuprofen (ADVIL,MOTRIN) 200 MG tablet Take 400 mg by mouth every 6 (six) hours as needed for moderate pain.    Yes [provider]  lisinopril (PRINIVIL,ZESTRIL) 10 MG tablet Take 1 tablet (10 mg total) by mouth daily. 03/20/17  Yes Arnoldo Morale, MD  magnesium oxide (MAG-OX) 400 (241.3 Mg) MG tablet Take 1 tablet (400 mg total) by mouth 2 (two) times daily. 11/07/16  Yes Eugenie Filler, MD  methocarbamol (ROBAXIN-750) 750 MG tablet Take 1 tablet (750 mg total) by mouth every 8 (eight) hours as needed for muscle spasms. 03/20/17  Yes Arnoldo Morale, MD  metoprolol tartrate (LOPRESSOR) 25 MG tablet Take 1 tablet (25 mg total) by mouth 2 (two) times daily. 03/20/17  Yes Arnoldo Morale, MD  mirtazapine (REMERON) 15 MG tablet Take 15 mg by mouth at bedtime.   Yes [provider]  Multiple Vitamin (MULTIVITAMIN WITH MINERALS) TABS tablet Take 1 tablet by mouth daily. 11/08/16  Yes Eugenie Filler, MD  ondansetron (ZOFRAN) 4 MG tablet Take 1 tablet (4 mg total) by mouth every 6 (six) hours. 03/22/17  Yes Montine Circle, PA-C  potassium chloride SA (K-DUR,KLOR-CON) 20 MEQ tablet TAKE ONE TABLET BY MOUTH THREE TIMES DAILY 02/01/17  Yes Arnoldo Morale, MD  sulfamethoxazole-trimethoprim (BACTRIM DS) 800-160 MG tablet Take 1 tablet by mouth daily. 05/31/17  Yes Caren Griffins, MD  tiotropium (SPIRIVA) 18 MCG inhalation capsule Place 1 capsule (18 mcg total) into inhaler and inhale daily. 11/08/16  Yes Eugenie Filler, MD  Valbenazine Tosylate [facility]) 80 MG CAPS Take 80 mg by mouth 2 (two) times daily.    Yes [provider]    Current Facility-Administered Medications  Medication Dose Route Frequency Provider Last Rate Last Dose  . 0.9 %  sodium chloride infusion   Intravenous Continuous Ivor Costa, MD      . 0.9 %  sodium chloride infusion   Intravenous Once Regalado, Belkys A, MD      . 0.9 %  sodium chloride infusion    Intravenous Once Regalado, Belkys A, MD      . acetaminophen (TYLENOL) tablet 500 mg  500 mg Oral Q6H PRN Ivor Costa, MD      . albuterol (PROVENTIL) (2.5 MG/3ML) 0.083% nebulizer solution 2.5 mg  2.5 mg Nebulization Q4H PRN Ivor Costa, MD      . benztropine (COGENTIN) tablet 1 mg  1 mg Oral Daily Regalado, Belkys A, MD       And  . benztropine (COGENTIN) tablet 2 mg  2 mg Oral QHS Regalado, Belkys A, MD      . dextromethorphan-guaiFENesin ([place] DM) 30-600 MG per 12 hr tablet 1 tablet  1 tablet Oral BID Ivor Costa, MD      . elvitegravir-cobicistat-emtricitabine-tenofovir (GENVOYA) 150-150-200-10 MG tablet 1 tablet  1 tablet Oral Q breakfast Ivor Costa,  MD      . Derrill Memo ON 06/13/2017] fluconazole (DIFLUCAN) tablet 100 mg  100 mg Oral Weekly Ivor Costa, MD      . folic acid (FOLVITE) tablet 1 mg  1 mg Oral Daily Ivor Costa, MD      . gabapentin (NEURONTIN) capsule 600 mg  600 mg Oral QHS Ivor Costa, MD      . haloperidol (HALDOL) tablet 10 mg  10 mg Oral BID Ivor Costa, MD      . hydrALAZINE (APRESOLINE) injection 5 mg  5 mg Intravenous Q2H PRN Ivor Costa, MD      . LORazepam (ATIVAN) injection 0-4 mg  0-4 mg Intravenous Q6H Ivor Costa, MD       Followed by  . [START ON 06/14/2017] LORazepam (ATIVAN) injection 0-4 mg  0-4 mg Intravenous Q12H Ivor Costa, MD      . LORazepam (ATIVAN) tablet 1 mg  1 mg Oral Q6H PRN Ivor Costa, MD       Or  . LORazepam (ATIVAN) injection 1 mg  1 mg Intravenous Q6H PRN Ivor Costa, MD      . magnesium oxide (MAG-OX) tablet 400 mg  400 mg Oral BID Ivor Costa, MD      . methocarbamol (ROBAXIN) tablet 750 mg  750 mg Oral Q8H PRN Ivor Costa, MD      . metoprolol tartrate (LOPRESSOR) tablet 25 mg  25 mg Oral BID Ivor Costa, MD      . mirtazapine (REMERON) tablet 15 mg  15 mg Oral QHS Ivor Costa, MD      . multivitamin with minerals tablet 1 tablet  1 tablet Oral Daily Ivor Costa, MD      . nicotine (NICODERM CQ - dosed in mg/24 hours) patch 21 mg  21 mg Transdermal  Daily Ivor Costa, MD      . nitroGLYCERIN (NITROSTAT) SL tablet 0.4 mg  0.4 mg Sublingual Q5 min PRN Ivor Costa, MD      . ondansetron [facility]) tablet 4 mg  4 mg Oral Q6H PRN Ivor Costa, MD       Or  . ondansetron [facility]) injection 4 mg  4 mg Intravenous Q6H PRN Ivor Costa, MD      . oxyCODONE (Oxy IR/ROXICODONE) immediate release tablet 10 mg  10 mg Oral Q6H PRN Ivor Costa, MD      . pantoprazole (PROTONIX) 80 mg in sodium chloride 0.9 % 250 mL (0.32 mg/mL) infusion  8 mg/hr Intravenous Continuous Montine Circle, PA-C 25 mL/hr at 06/12/17 0628 8 mg/hr at 06/12/17 0628  . [START ON 06/15/2017] pantoprazole (PROTONIX) injection 40 mg  40 mg Intravenous Q12H Montine Circle, PA-C      . sulfamethoxazole-trimethoprim (BACTRIM DS,SEPTRA DS) 800-160 MG per tablet 1 tablet  1 tablet Oral Daily Ivor Costa, MD      . technetium albumin aggregated (MAA) injection solution 4.8 millicurie  4.8 millicurie Intravenous Once PRN Misty Stanley, MD      . thiamine (VITAMIN B-1) tablet 100 mg  100 mg Oral Daily Ivor Costa, MD       Or  . thiamine (B-1) injection 100 mg  100 mg Intravenous Daily Ivor Costa, MD      . Derrill Memo ON 06/13/2017] tiotropium (SPIRIVA) inhalation capsule 18 mcg  18 mcg Inhalation Daily Ivor Costa, MD      . [place] Tosylate CAPS 80 mg  80 mg Oral BID Ivor Costa, MD      . zolpidem [facility]) tablet 5 mg  5 mg Oral QHS PRN Ivor Costa, MD       Current Outpatient Prescriptions  Medication Sig Dispense Refill  . acetaminophen (TYLENOL) 500 MG tablet Take 1 tablet (500 mg total) by mouth every 6 (six) hours as needed for moderate pain. 30 tablet 0  . benztropine (COGENTIN) 1 MG tablet 1 tab in the morning 2 tabs at night 14 tablet 0  . ELIQUIS 2.5 MG TABS tablet TAKE ONE TABLET BY MOUTH TWICE DAILY 60 tablet 0  . elvitegravir-cobicistat-emtricitabine-tenofovir (GENVOYA) 150-150-200-10 MG TABS tablet TAKE 1 TABLET BY MOUTH DAILY WITH BREAKFAST. *CALL [phone] to see Dr Johnnye Sima ASAP* 30  tablet 1  . fluconazole (DIFLUCAN) 100 MG tablet Take 1 tablet (100 mg total) by mouth once a week. 30 tablet 0  . gabapentin (NEURONTIN) 600 MG tablet Take 600 mg by mouth at bedtime.    . haloperidol (HALDOL) 10 MG tablet Take 10 mg by mouth 2 (two) times daily.    Marland Kitchen ibuprofen (ADVIL,MOTRIN) 200 MG tablet Take 400 mg by mouth every 6 (six) hours as needed for moderate pain.     Marland Kitchen lisinopril (PRINIVIL,ZESTRIL) 10 MG tablet Take 1 tablet (10 mg total) by mouth daily. 30 tablet 6  . magnesium oxide (MAG-OX) 400 (241.3 Mg) MG tablet Take 1 tablet (400 mg total) by mouth 2 (two) times daily. 60 tablet 0  . methocarbamol (ROBAXIN-750) 750 MG tablet Take 1 tablet (750 mg total) by mouth every 8 (eight) hours as needed for muscle spasms. 90 tablet 1  . metoprolol tartrate (LOPRESSOR) 25 MG tablet Take 1 tablet (25 mg total) by mouth 2 (two) times daily. 60 tablet 6  . mirtazapine (REMERON) 15 MG tablet Take 15 mg by mouth at bedtime.    . Multiple Vitamin (MULTIVITAMIN WITH MINERALS) TABS tablet Take 1 tablet by mouth daily.    . ondansetron (ZOFRAN) 4 MG tablet Take 1 tablet (4 mg total) by mouth every 6 (six) hours. 12 tablet 0  . potassium chloride SA (K-DUR,KLOR-CON) 20 MEQ tablet TAKE ONE TABLET BY MOUTH THREE TIMES DAILY 30 tablet 0  . sulfamethoxazole-trimethoprim (BACTRIM DS) 800-160 MG tablet Take 1 tablet by mouth daily. 30 tablet 1  . tiotropium (SPIRIVA) 18 MCG inhalation capsule Place 1 capsule (18 mcg total) into inhaler and inhale daily. 30 capsule 6  . Valbenazine Tosylate (INGREZZA) 80 MG CAPS Take 80 mg by mouth 2 (two) times daily.       Allergies as of 06/12/2017 - Review Complete 06/12/2017  Allergen Reaction Noted  . Amoxicillin Shortness Of Breath and Rash   . Latex Shortness Of Breath 01/26/2016    Family History  Problem Relation Age of Onset  . CAD Mother   . Kidney disease Sister   . Lupus Brother   . Cancer Maternal Grandmother   . Stroke Paternal Grandmother      Social History   Social History  . Marital status: Divorced    Spouse name: N/A  . Number of children: N/A  . Years of education: N/A   Occupational History  . disabled    Social History Main Topics  . Smoking status: Current Every Day Smoker    Packs/day: 1.00    Years: 33.00    Types: Cigarettes  . Smokeless tobacco: Never Used  . Alcohol use 0.0 oz/week     Comment: Drank a fifth this AM, drinks 2x/month  . Drug use: No     Comment: last use summer 2017  . Sexual  activity: No     Comment: given condoms   Other Topics Concern  . Not on file   Social History Narrative  . No narrative on file    Review of Systems: All systems reviewed and negative except where noted in HPI.  Physical Exam: Vital signs in last 24 hours: Temp:  [97.8 F (36.6 C)-99.1 F (37.3 C)] 99.1 F (37.3 C) (09/18 1000) Pulse Rate:  [74-113] 74 (09/18 1000) Resp:  [17-30] 17 (09/18 1030) BP: (96-131)/(72-88) 112/82 (09/18 1030) SpO2:  [94 %-100 %] 94 % (09/18 1000)   General:   Alert, thin black male in NAD Psych:  Quiet, mainly keeps eyes closed and he isn't overly engaging.  Eyes:  Pupils equal, sclera clear, no icterus.   Conjunctiva pink. Ears:  Normal auditory acuity. Nose:  No deformity, discharge,  or lesions. Neck:  Supple; no masses Lungs:  Normal respiratory effort Heart:  Regular rate and rhythm; no murmurs, no edema Abdomen:  Soft, non-distended, nontender, BS active, no palp mass    Rectal:  Several fleshy lesions, ? Condylomas. Patulous sphincter. Small amount of reddish brown liquid in vault.   Msk:  Symmetrical without gross deformities. . Neurologic:  Alert and  oriented x4;  grossly normal neurologically. Skin:  Intact without significant lesions or rashes..   Intake/Output from previous day: No intake/output data recorded. Intake/Output this shift: No intake/output data recorded.  Lab Results:  Recent Labs  06/12/17 0326 06/12/17 0533  WBC 11.6* 10.5   HGB 8.1* 7.4*  HCT 23.6* 21.5*  PLT 159 151   BMET  Recent Labs  06/12/17 0326  NA 136  K 4.2  CL 105  CO2 22  GLUCOSE 145*  BUN 50*  CREATININE 1.73*  CALCIUM 9.1   PT/INR  Recent Labs  06/12/17 0533  LABPROT 13.4  INR 1.03    Studies/Results: Dg Chest 2 View  Result Date: 06/12/2017 CLINICAL DATA:  Chest pain and shortness of breath.  Fall. EXAM: CHEST  2 VIEW COMPARISON:  05/29/2017 FINDINGS: Normal heart size and pulmonary vascularity. No focal airspace disease or consolidation in the lungs. No blunting of costophrenic angles. No pneumothorax. Mediastinal contours appear intact. IMPRESSION: No active cardiopulmonary disease. Electronically Signed   By: Lucienne Capers M.D.   On: 06/12/2017 03:31   Ct Head Wo Contrast  Result Date: 06/12/2017 CLINICAL DATA:  Fall with head trauma EXAM: CT HEAD WITHOUT CONTRAST CT CERVICAL SPINE WITHOUT CONTRAST TECHNIQUE: Multidetector CT imaging of the head and cervical spine was performed following the standard protocol without intravenous contrast. Multiplanar CT image reconstructions of the cervical spine were also generated. COMPARISON:  Brain MRI 12/02/2016 FINDINGS: CT HEAD FINDINGS Brain: No mass lesion, intraparenchymal hemorrhage or extra-axial collection. No evidence of acute cortical infarct. There is periventricular hypoattenuation compatible with chronic microvascular disease. Volume loss is greater than expected for age. Vascular: No hyperdense vessel or unexpected calcification. Skull: Normal visualized skull base, calvarium and extracranial soft tissues. Sinuses/Orbits: No sinus fluid levels or advanced mucosal thickening. No mastoid effusion. Normal orbits. CT CERVICAL SPINE FINDINGS Alignment: No static subluxation. Facets are aligned. Occipital condyles are normally positioned. Skull base and vertebrae: No acute fracture. Soft tissues and spinal canal: No prevertebral fluid or swelling. No visible canal hematoma. Disc  levels: Degenerative disease is greatest at C4-C5. No bony spinal canal stenosis. Upper chest: Biapical emphysema. Other: Normal visualized paraspinal cervical soft tissues. IMPRESSION: 1. Age advanced atrophy the and findings of chronic microvascular ischemia. 2. No fracture  or static subluxation of the cervical spine. 3. Biapical Emphysema (ICD10-J43.9). Electronically Signed   By: Ulyses Jarred M.D.   On: 06/12/2017 03:34   Ct Cervical Spine Wo Contrast  Result Date: 06/12/2017 CLINICAL DATA:  Fall with head trauma EXAM: CT HEAD WITHOUT CONTRAST CT CERVICAL SPINE WITHOUT CONTRAST TECHNIQUE: Multidetector CT imaging of the head and cervical spine was performed following the standard protocol without intravenous contrast. Multiplanar CT image reconstructions of the cervical spine were also generated. COMPARISON:  Brain MRI 12/02/2016 FINDINGS: CT HEAD FINDINGS Brain: No mass lesion, intraparenchymal hemorrhage or extra-axial collection. No evidence of acute cortical infarct. There is periventricular hypoattenuation compatible with chronic microvascular disease. Volume loss is greater than expected for age. Vascular: No hyperdense vessel or unexpected calcification. Skull: Normal visualized skull base, calvarium and extracranial soft tissues. Sinuses/Orbits: No sinus fluid levels or advanced mucosal thickening. No mastoid effusion. Normal orbits. CT CERVICAL SPINE FINDINGS Alignment: No static subluxation. Facets are aligned. Occipital condyles are normally positioned. Skull base and vertebrae: No acute fracture. Soft tissues and spinal canal: No prevertebral fluid or swelling. No visible canal hematoma. Disc levels: Degenerative disease is greatest at C4-C5. No bony spinal canal stenosis. Upper chest: Biapical emphysema. Other: Normal visualized paraspinal cervical soft tissues. IMPRESSION: 1. Age advanced atrophy the and findings of chronic microvascular ischemia. 2. No fracture or static subluxation of the  cervical spine. 3. Biapical Emphysema (ICD10-J43.9). Electronically Signed   By: Ulyses Jarred M.D.   On: 06/12/2017 03:34     Tye Savoy, NP-C @  06/12/2017, 11:20 AM  Pager number [phone]  GI ATTENDING  History, laboratories, x-rays reviewed. Patient personally seen and examined. Agree with comprehensive consultation note as outlined above. Incredibly complicated 56 year old with multiple significant medical problems who presents with complaints of rectal bleeding and interval anemia on anticoagulation. Terrible historian. Seems to have had lower GI bleeding. Cannot rule out upper GI bleeding with prior abnormalities on CT and chronic NSAID use. Hemodynamically stable. Agree with transfusion, PPI, Eliquis washout, and plans for endoscopic evaluation.  Docia Chuck. Geri Seminole., M.D. [facility] Division of Gastroenterology

## 2017-06-12 NOTE — Progress Notes (Signed)
PROGRESS NOTE    [name] Herring  DZH:[id number] DOB: 08-May-1961 DOA: 06/12/2017 PCP: Arnoldo Morale, MD   Brief Narrative:  HPI: [name] Herring is a 56 y.o. male with medical history significant of hypertension, depression, polysubstance abuse (tobacco, alcohol, cocaine), DVT, PE on eliquis. , HIV (CD4 to 50 and viral load 1.477 on 05/30/17), schizophrenia, immunodeficiency disorder, who presents with chest pain, shortness breath, nausea, vomiting, diarrhea, abdominal pain, rectal bleeding.  Patient states that he has nausea, vomiting, abdominal pain diarrhea since yesterday. He vomited 3 times without blood in the vomitus. He has had 4 times of watery diarrhea since yesterday. His abdominal pain is located in the central abdomen, constant, sharp, 8 out of 10 in severity, nonradiating. He states that he noted blood in stool, which is black and red color since yesterday.  He states that he has been having chest pain or shortness of breath in the past 2 days. He has cough with brownish colored mucus production. No fever or chills. His chest pain is located in the frontal chest on both sides, constant, sharp, pleuritic, 10 out of 10 in severity. Patient states that he has been compliant to Eliquis, and did not miss any doses, last dose of Eliquis was yesterday afternoon. No symptoms of UTI or unilateral weakness. Patient states that he fell because of weakness and shortness of breath .No significant injury.  ED Course: pt was found to have positive FOBT, hemoglobin dropped from 11.2 on 05/30/17-->8.8, WBC 11.6, negative troponin, acute renal injury with creatinine 1.73, temperature normal, tachycardia, tachypnea, oxygen saturation 96% on room air, negative chest x-ray. Negative CT head and C-spine. Patient is admitted to telemetry bed as inpatient.   Assessment & Plan:   Principal Problem:   GIB (gastrointestinal bleeding) Active Problems:   Human immunodeficiency virus (HIV) disease (HCC)  Depression   Alcohol use disorder, severe, dependence ([place])   Paranoid schizophrenia, chronic condition ([place])   Tobacco abuse   PE (pulmonary thromboembolism) ([place])   DVT of lower extremity, bilateral ([place])   Fall   AKI (acute kidney injury) ([place])   Protein-calorie malnutrition, severe (HCC)   Chest pain   Nausea vomiting and diarrhea  GIB (gastrointestinal bleeding): Hemoglobin dropped from 12.1 on 9/5-->8.8. Marland Kitchen Patient is taking ibuprofen. He is also drinking alcohol. Patient states that he had colonoscopy and EGD 5 years ago in [place]. He states that he had "little cancer" by colonoscopy. I could not find records for colonoscopy and EGD.  -IV protonix.  -GI consulted.  -Will transfuse 2 units PRBC>   Human immunodeficiency virus (HIV) disease ([place]): CD4 =50 and viral load 1.477 on 05/30/17.  -continue home HIV meds -pt is on Bactrim and fluconazole for prophylaxis  Hx of PE and DVT: pt is on Eliquis. Patient states that he has been compliant to taking Eliquis, did not miss any does. Now pt has shortness of breath and chest pain. Etiology is not clear. Patient has GI bleeding, will need to hold Eliquis temporarily. -will hold Eliquis due to GI bleed.  -Patient has IVC filter in placed.  -V-Q scan low probability for PE.  -trop x 3 due to chest pain -prn NTG and Oxycodone for pain -prn albuterol nebs -continue home Spiriva inhaler -follow doppler.   Fall: CT head and C-spine is negative. -PT/OT  AKI: pt's Cre is 1.73 and BUN 50 on admission. Likely due to prerenal secondary to dehydration and continuation of ACEI, NSAIDs and Bactrim. - IVF as above -  Hold lisinopril and ibuprofen  Protein-calorie malnutrition, severe ([place]): -Nutrition consult  Nausea vomiting and diarrhea: Partly due to alcoholic gastritis. Viral gastroenteritis is also possible. Patient has a mild leukocytosis, but no fever. Clinically does not seem to have C. difficile colitis. -When necessary  Zofran for nausea, oxycodone for pain -IV fluid as above.  HTN: -Continue metoprolol -Hold lisinopril due to acute renal injury -IV hydralazine when necessary  Schizophrenia and depression: -Continue haldol, Remeron,Valbenzine, Cogentin  Tobacco abuse and Alcohol abuse: -Nicotine patch -CIWA protocol     DVT prophylaxis: SCD Code Status: Full code.  Family Communication:  Disposition Plan: remain inpatient.    Consultants:   GI  Procedures:  VQ scan.    Antimicrobials:  Bactrim Prophylaxis.  Fluconazole prophylaxis.    Subjective: He report dyspnea, chest pain on and off.  Last alcohol drink was on Sunday   Objective: Vitals:   06/12/17 0530 06/12/17 0600 06/12/17 0628 06/12/17 0630  BP: 111/79 111/80 111/80 102/82  Pulse: 87  88 85  Resp: (!) 27 (!) 23  17  Temp:      TempSrc:      SpO2: 100%   100%   No intake or output data in the 24 hours ending 06/12/17 0904 There were no vitals filed for this visit.  Examination:  General exam: NAD Respiratory system: Clear to auscultation. Respiratory effort normal. Cardiovascular system: S1 & S2 heard, RRR. No JVD, murmurs, rubs, gallops or clicks. No pedal edema. Gastrointestinal system: Abdomen is nondistended, soft and nontender. No organomegaly or masses felt. Normal bowel sounds heard. Central nervous system: Alert and oriented. No focal neurological deficits. Extremities: Symmetric 5 x 5 power. Skin: No rashes, lesions or ulcers     Data Reviewed: I have personally reviewed following labs and imaging studies  CBC:  Recent Labs Lab 06/12/17 0326 06/12/17 0533  WBC 11.6* 10.5  NEUTROABS 9.7*  --   HGB 8.1* 7.4*  HCT 23.6* 21.5*  MCV 89.1 88.5  PLT 159 595   Basic Metabolic Panel:  Recent Labs Lab 06/12/17 0326  NA 136  K 4.2  CL 105  CO2 22  GLUCOSE 145*  BUN 50*  CREATININE 1.73*  CALCIUM 9.1   GFR: Estimated Creatinine Clearance: 41.8 mL/min (A) (by C-G formula based on  SCr of 1.73 mg/dL (H)). Liver Function Tests: No results for input(s): AST, ALT, ALKPHOS, BILITOT, PROT, ALBUMIN in the last 168 hours. No results for input(s): LIPASE, AMYLASE in the last 168 hours. No results for input(s): AMMONIA in the last 168 hours. Coagulation Profile:  Recent Labs Lab 06/12/17 0533  INR 1.03   Cardiac Enzymes:  Recent Labs Lab 06/12/17 0326 06/12/17 0533  TROPONINI <0.03 <0.03   BNP (last 3 results) No results for input(s): PROBNP in the last 8760 hours. HbA1C: No results for input(s): HGBA1C in the last 72 hours. CBG:  Recent Labs Lab 06/12/17 0824  GLUCAP 117*   Lipid Profile: No results for input(s): CHOL, HDL, LDLCALC, TRIG, CHOLHDL, LDLDIRECT in the last 72 hours. Thyroid Function Tests: No results for input(s): TSH, T4TOTAL, FREET4, T3FREE, THYROIDAB in the last 72 hours. Anemia Panel: No results for input(s): VITAMINB12, FOLATE, FERRITIN, TIBC, IRON, RETICCTPCT in the last 72 hours. Sepsis Labs: No results for input(s): PROCALCITON, LATICACIDVEN in the last 168 hours.  No results found for this or any previous visit (from the past 240 hour(s)).       Radiology Studies: Dg Chest 2 View  Result Date: 06/12/2017 CLINICAL  DATA:  Chest pain and shortness of breath.  Fall. EXAM: CHEST  2 VIEW COMPARISON:  05/29/2017 FINDINGS: Normal heart size and pulmonary vascularity. No focal airspace disease or consolidation in the lungs. No blunting of costophrenic angles. No pneumothorax. Mediastinal contours appear intact. IMPRESSION: No active cardiopulmonary disease. Electronically Signed   By: Lucienne Capers M.D.   On: 06/12/2017 03:31   Ct Head Wo Contrast  Result Date: 06/12/2017 CLINICAL DATA:  Fall with head trauma EXAM: CT HEAD WITHOUT CONTRAST CT CERVICAL SPINE WITHOUT CONTRAST TECHNIQUE: Multidetector CT imaging of the head and cervical spine was performed following the standard protocol without intravenous contrast. Multiplanar CT  image reconstructions of the cervical spine were also generated. COMPARISON:  Brain MRI 12/02/2016 FINDINGS: CT HEAD FINDINGS Brain: No mass lesion, intraparenchymal hemorrhage or extra-axial collection. No evidence of acute cortical infarct. There is periventricular hypoattenuation compatible with chronic microvascular disease. Volume loss is greater than expected for age. Vascular: No hyperdense vessel or unexpected calcification. Skull: Normal visualized skull base, calvarium and extracranial soft tissues. Sinuses/Orbits: No sinus fluid levels or advanced mucosal thickening. No mastoid effusion. Normal orbits. CT CERVICAL SPINE FINDINGS Alignment: No static subluxation. Facets are aligned. Occipital condyles are normally positioned. Skull base and vertebrae: No acute fracture. Soft tissues and spinal canal: No prevertebral fluid or swelling. No visible canal hematoma. Disc levels: Degenerative disease is greatest at C4-C5. No bony spinal canal stenosis. Upper chest: Biapical emphysema. Other: Normal visualized paraspinal cervical soft tissues. IMPRESSION: 1. Age advanced atrophy the and findings of chronic microvascular ischemia. 2. No fracture or static subluxation of the cervical spine. 3. Biapical Emphysema (ICD10-J43.9). Electronically Signed   By: Ulyses Jarred M.D.   On: 06/12/2017 03:34   Ct Cervical Spine Wo Contrast  Result Date: 06/12/2017 CLINICAL DATA:  Fall with head trauma EXAM: CT HEAD WITHOUT CONTRAST CT CERVICAL SPINE WITHOUT CONTRAST TECHNIQUE: Multidetector CT imaging of the head and cervical spine was performed following the standard protocol without intravenous contrast. Multiplanar CT image reconstructions of the cervical spine were also generated. COMPARISON:  Brain MRI 12/02/2016 FINDINGS: CT HEAD FINDINGS Brain: No mass lesion, intraparenchymal hemorrhage or extra-axial collection. No evidence of acute cortical infarct. There is periventricular hypoattenuation compatible with chronic  microvascular disease. Volume loss is greater than expected for age. Vascular: No hyperdense vessel or unexpected calcification. Skull: Normal visualized skull base, calvarium and extracranial soft tissues. Sinuses/Orbits: No sinus fluid levels or advanced mucosal thickening. No mastoid effusion. Normal orbits. CT CERVICAL SPINE FINDINGS Alignment: No static subluxation. Facets are aligned. Occipital condyles are normally positioned. Skull base and vertebrae: No acute fracture. Soft tissues and spinal canal: No prevertebral fluid or swelling. No visible canal hematoma. Disc levels: Degenerative disease is greatest at C4-C5. No bony spinal canal stenosis. Upper chest: Biapical emphysema. Other: Normal visualized paraspinal cervical soft tissues. IMPRESSION: 1. Age advanced atrophy the and findings of chronic microvascular ischemia. 2. No fracture or static subluxation of the cervical spine. 3. Biapical Emphysema (ICD10-J43.9). Electronically Signed   By: Ulyses Jarred M.D.   On: 06/12/2017 03:34        Scheduled Meds: . benztropine  1-2 mg Oral BID  . dextromethorphan-guaiFENesin  1 tablet Oral BID  . elvitegravir-cobicistat-emtricitabine-tenofovir  1 tablet Oral Q breakfast  . fluconazole  100 mg Oral Weekly  . folic acid  1 mg Oral Daily  . gabapentin  600 mg Oral QHS  . haloperidol  10 mg Oral BID  . LORazepam  0-4 mg Intravenous  Q6H   Followed by  . [START ON 06/14/2017] LORazepam  0-4 mg Intravenous Q12H  . magnesium oxide  400 mg Oral BID  . metoprolol tartrate  25 mg Oral BID  . mirtazapine  15 mg Oral QHS  . multivitamin with minerals  1 tablet Oral Daily  . nicotine  21 mg Transdermal Daily  . [START ON 06/15/2017] pantoprazole  40 mg Intravenous Q12H  . sulfamethoxazole-trimethoprim  1 tablet Oral Daily  . thiamine  100 mg Oral Daily   Or  . thiamine  100 mg Intravenous Daily  . tiotropium  18 mcg Inhalation Daily  . Valbenazine Tosylate  80 mg Oral BID   Continuous  Infusions: . sodium chloride    . sodium chloride    . pantoprozole (PROTONIX) infusion 8 mg/hr (06/12/17 0628)     LOS: 0 days    Time spent: 35 minutes.     Elmarie Shiley, MD [facility] Pager [phone]  If 7PM-7AM, please contact night-coverage [email or web address] Password [email or web address] 06/12/2017, 9:04 AM

## 2017-06-12 NOTE — Progress Notes (Signed)
Initial Nutrition Assessment  DOCUMENTATION CODES:   Severe malnutrition in context of acute illness/injury, Severe malnutrition in context of chronic illness  INTERVENTION:  - Diet advancement as medically feasible. - RD will provide interventions at the time of follow-up/diet advancement.   NUTRITION DIAGNOSIS:   Malnutrition (severe) related to acute illness, chronic illness (GIB, hx of HIV) as evidenced by energy intake < or equal to 50% for > or equal to 5 days, severe depletion of muscle mass, mild depletion of body fat.  GOAL:   Patient will meet greater than or equal to 90% of their needs  MONITOR:   Diet advancement, Weight trends, Labs  REASON FOR ASSESSMENT:   Consult Assessment of nutrition requirement/status  ASSESSMENT:   56 y.o. male with medical history significant of hypertension, depression, polysubstance abuse (tobacco, alcohol, cocaine), DVT, PE on a decrease, HIV (CD4 to 50 and viral load 1.477 on 05/30/17), schizophrenia, immunodeficiency disorder, who presents with chest pain, shortness breath, nausea, vomiting, diarrhea, abdominal pain, rectal bleeding.  Pt seen for consult. BMI indicates normal weight. Pt has been NPO since admission. He is very drowsy during RD visit but able to provide some information. He indicates that he is currently feeling a mild pain/pressure and mild nausea, but does not feel that he is going to vomit. He reports that for the past 1 month his appetite has been "mild" and is able to specify that this means decreased from his baseline. He reports that yesterday he ate a little bit of steak and was able to hold it down. He usually eating 3 meals/day but more recently has been eating 1-2 meals/day. He states that amount consumed has further decreased the past few days d/t symptoms.   Physical assessment shows severe muscle wasting to shoulders and clavicles, moderate muscle wasting to temples and thigh, mild muscle wasting to calf, and  mild fat wasting to upper arm; mild edema to BLE. Per chart review, weight is +6 lbs since 03/22/17; will continue to monitor weight trends during hospitalization.  Medications reviewed; 1 mg oral folic acid/day, 601 mg oral Mag-ox BID, daily multivitamin with minerals, 8 mg/hr IV Protonix x72 hours starting today at 0530, 100 mg oral thiamine/day. Labs reviewed; CBG: 117 mg/dL, BUN: 50 mg/dL, creatinine: 1.73 mg/dL, GFR: 49 mL/min.   IVF: NS @ 125 mL/hr.   Diet Order:  Diet NPO time specified Except for: Sips with Meds, Ice Chips  Skin:  Reviewed, no issues  Last BM:  PTA/unknown  Height:   Ht Readings from Last 1 Encounters:  05/29/17 5\' 10"  (1.778 m)    Weight:   Wt Readings from Last 1 Encounters:  05/29/17 135 lb (61.2 kg)    Ideal Body Weight:  78.45 kg  BMI:  There is no height or weight on file to calculate BMI.  Estimated Nutritional Needs:   Kcal:  1835-2080 (30-34 kcal/kg)  Protein:  65-75 grams  Fluid:  >/= 1.9 L/day  EDUCATION NEEDS:   No education needs identified at this time    Jarome Matin, MS, RD, LDN, CNSC Inpatient Clinical Dietitian Pager # [phone] After hours/weekend pager # [phone]

## 2017-06-12 NOTE — ED Notes (Signed)
Patient transported to [facility] 

## 2017-06-12 NOTE — H&P (Signed)
History and Physical    [name] Herring KVQ:[id number] DOB: February 26, 1961 DOA: 06/12/2017  Referring MD/NP/PA:   PCP: [name] Morale, MD   Patient coming from:  The patient is coming from home.  At baseline, pt is independent for most of ADL. SNF  Assistant living facility   [facility].       Chief Complaint: Chest pain, shortness breath, nausea, vomiting, diarrhea, abdominal pain, rectal bleeding  HPI: [name] Herring is a 56 y.o. male with medical history significant of hypertension, depression, polysubstance abuse (tobacco, alcohol, cocaine), DVT, PE on a decrease, HIV (CD4 to 50 and viral load 1.477 on 05/30/17), schizophrenia, immunodeficiency disorder, who presents with chest pain, shortness breath, nausea, vomiting, diarrhea, abdominal pain, rectal bleeding.  Patient states that he has nausea, vomiting, abdominal pain diarrhea since yesterday. He vomited 3 times without blood in the vomitus. He has had 4 times of watery diarrhea since yesterday. His abdominal pain is located in the central abdomen, constant, sharp, 8 out of 10 in severity, nonradiating. He states that he noted blood in stool, which is black and red color since yesterday.  He states that he has been having chest pain or shortness of breath in the past 2 days. He has cough with brownish colored mucus production. No fever or chills. His chest pain is located in the frontal chest on both sides, constant, sharp, pleuritic, 10 out of 10 in severity. Patient states that he has been compliant to Eliquis, and did not miss any doses, last dose of Eliquis was yesterday afternoon. No symptoms of UTI or unilateral weakness. Patient states that he fell because of weakness and shortness of breath .No significant injury.  ED Course: pt was found to have positive FOBT, hemoglobin dropped from 11.2 on 05/30/17-->8.8, WBC 11.6, negative troponin, acute renal injury with creatinine 1.73, temperature normal, tachycardia, tachypnea, oxygen  saturation 96% on room air, negative chest x-ray. Negative CT head and C-spine. Patient is admitted to telemetry bed as inpatient.  Review of Systems:   General: no fevers, chills, no body weight gain, has poor appetite, has fatigue HEENT: no blurry vision, hearing changes or sore throat Respiratory: has dyspnea, coughing, no wheezing CV: has chest pain, no palpitations GI: has nausea, vomiting, abdominal pain, diarrhea, no constipation GU: no dysuria, burning on urination, increased urinary frequency, hematuria  Ext: no leg edema Neuro: no unilateral weakness, numbness, or tingling, no vision change or hearing loss. Had fall. Skin: no rash, no skin tear. MSK: No muscle spasm, no deformity, no limitation of range of movement in spin Heme: No easy bruising.  Travel history: No recent long distant travel.  Allergy:  Allergies  Allergen Reactions  . Amoxicillin Shortness Of Breath and Rash    Has patient had a PCN reaction causing immediate rash, facial/tongue/throat swelling, [name] Has patient had a PCN reaction causing severe rash involving mucus membranes or skin necrosis: no Has patient had a PCN reaction that required hospitalization: yes drs office visit Has patient had a PCN reaction occurring within the last 10 years: no If all of the above answers are "NO", then may proceed with Cephalosporin use.   . Latex Shortness Of Breath    Past Medical History:  Diagnosis Date  . Depression   . HIV (human immunodeficiency virus infection) ([place])   . Hypertension   . Immune deficiency disorder ([place])   . PE (pulmonary thromboembolism) ([place]) 2018  . Personality disorder   . Schizophrenia ([place])  Past Surgical History:  Procedure Laterality Date  . IR GENERIC HISTORICAL  11/04/2016   IR IVC FILTER PLMT / S&I Burke Keels GUID/MOD SED 11/04/2016 Aletta Edouard, MD [facility]-INTERV RAD    Social History:  reports that he has been smoking Cigarettes.  He has  a 33.00 pack-year smoking history. He has never used smokeless tobacco. He reports that he drinks alcohol. He reports that he does not use drugs.  Family History:  Family History  Problem Relation Age of Onset  . CAD Mother   . Kidney disease Sister   . Lupus Brother   . Cancer Maternal Grandmother   . Stroke Paternal Grandmother      Prior to Admission medications   Medication Sig Start Date End Date Taking? Authorizing Provider  acetaminophen (TYLENOL) 500 MG tablet Take 1 tablet (500 mg total) by mouth every 6 (six) hours as needed for moderate pain. 11/07/16  Yes Eugenie Filler, MD  benztropine (COGENTIN) 1 MG tablet 1 tab in the morning 2 tabs at night 11/10/16  Yes McClung, Angela M, PA-C  ELIQUIS 2.5 MG TABS tablet TAKE ONE TABLET BY MOUTH TWICE DAILY 05/14/17  Yes Amao, Enobong, MD  elvitegravir-cobicistat-emtricitabine-tenofovir (GENVOYA) 150-150-200-10 MG TABS tablet TAKE 1 TABLET BY MOUTH DAILY WITH BREAKFAST. *CALL [phone] to see Dr Johnnye Sima ASAP* 07/17/16  Yes Short, Noah Delaine, MD  fluconazole (DIFLUCAN) 100 MG tablet Take 1 tablet (100 mg total) by mouth once a week. 05/31/17 12/27/17 Yes Gherghe, Vella Redhead, MD  gabapentin (NEURONTIN) 600 MG tablet Take 600 mg by mouth at bedtime.   Yes [provider]  haloperidol (HALDOL) 10 MG tablet Take 10 mg by mouth 2 (two) times daily.   Yes [provider]  ibuprofen (ADVIL,MOTRIN) 200 MG tablet Take 400 mg by mouth every 6 (six) hours as needed for moderate pain.    Yes [provider]  lisinopril (PRINIVIL,ZESTRIL) 10 MG tablet Take 1 tablet (10 mg total) by mouth daily. 03/20/17  Yes [name] Morale, MD  magnesium oxide (MAG-OX) 400 (241.3 Mg) MG tablet Take 1 tablet (400 mg total) by mouth 2 (two) times daily. 11/07/16  Yes Eugenie Filler, MD  methocarbamol (ROBAXIN-750) 750 MG tablet Take 1 tablet (750 mg total) by mouth every 8 (eight) hours as needed for muscle spasms. 03/20/17  Yes [name] Morale, MD    metoprolol tartrate (LOPRESSOR) 25 MG tablet Take 1 tablet (25 mg total) by mouth 2 (two) times daily. 03/20/17  Yes [name] Morale, MD  mirtazapine (REMERON) 15 MG tablet Take 15 mg by mouth at bedtime.   Yes [provider]  Multiple Vitamin (MULTIVITAMIN WITH MINERALS) TABS tablet Take 1 tablet by mouth daily. 11/08/16  Yes Eugenie Filler, MD  ondansetron (ZOFRAN) 4 MG tablet Take 1 tablet (4 mg total) by mouth every 6 (six) hours. 03/22/17  Yes Montine Circle, PA-C  potassium chloride SA (K-DUR,KLOR-CON) 20 MEQ tablet TAKE ONE TABLET BY MOUTH THREE TIMES DAILY 02/01/17  Yes [name] Morale, MD  sulfamethoxazole-trimethoprim (BACTRIM DS) 800-160 MG tablet Take 1 tablet by mouth daily. 05/31/17  Yes Caren Griffins, MD  tiotropium (SPIRIVA) 18 MCG inhalation capsule Place 1 capsule (18 mcg total) into inhaler and inhale daily. 11/08/16  Yes Eugenie Filler, MD  Valbenazine Tosylate [name]) 80 MG CAPS Take 80 mg by mouth 2 (two) times daily.    Yes [provider]    Physical Exam: Vitals:   06/12/17 0530 06/12/17 0600 06/12/17 0628 06/12/17 0630  BP: 111/79  111/80 111/80 102/82  Pulse: 87  88 85  Resp: (!) 27 (!) 23  17  Temp:      TempSrc:      SpO2: 100%   100%   General: Not in acute distress. Cachectic looking. HEENT:       Eyes: PERRL, EOMI, no scleral icterus.       ENT: No discharge from the ears and nose, no pharynx injection, no tonsillar enlargement.        Neck: No JVD, no bruit, no mass felt. Heme: No neck lymph node enlargement. Cardiac: S1/S2, RRR, No murmurs, No gallops or rubs. Respiratory: No rales, wheezing, rhonchi or rubs. GI: Soft, nondistended, has tenderness in central and epigastric area, no rebound pain, no organomegaly, BS present. GU: No hematuria Ext: No pitting leg edema bilaterally. 2+DP/PT pulse bilaterally. Musculoskeletal: No joint deformities, No joint redness or warmth, no limitation of ROM in spin. Skin: No rashes.  Neuro:  Alert, oriented X3, cranial nerves II-XII grossly intact, moves all extremities normally.   Psych: Patient is not psychotic, no suicidal or hemocidal ideation.  Labs on Admission: I have personally reviewed following labs and imaging studies  CBC:  Recent Labs Lab 06/12/17 0326 06/12/17 0533  WBC 11.6* 10.5  NEUTROABS 9.7*  --   HGB 8.1* 7.4*  HCT 23.6* 21.5*  MCV 89.1 88.5  PLT 159 193   Basic Metabolic Panel:  Recent Labs Lab 06/12/17 0326  NA 136  K 4.2  CL 105  CO2 22  GLUCOSE 145*  BUN 50*  CREATININE 1.73*  CALCIUM 9.1   GFR: Estimated Creatinine Clearance: 41.8 mL/min (A) (by C-G formula based on SCr of 1.73 mg/dL (H)). Liver Function Tests: No results for input(s): AST, ALT, ALKPHOS, BILITOT, PROT, ALBUMIN in the last 168 hours. No results for input(s): LIPASE, AMYLASE in the last 168 hours. No results for input(s): AMMONIA in the last 168 hours. Coagulation Profile:  Recent Labs Lab 06/12/17 0533  INR 1.03   Cardiac Enzymes:  Recent Labs Lab 06/12/17 0326 06/12/17 0533  TROPONINI <0.03 <0.03   BNP (last 3 results) No results for input(s): PROBNP in the last 8760 hours. HbA1C: No results for input(s): HGBA1C in the last 72 hours. CBG: No results for input(s): GLUCAP in the last 168 hours. Lipid Profile: No results for input(s): CHOL, HDL, LDLCALC, TRIG, CHOLHDL, LDLDIRECT in the last 72 hours. Thyroid Function Tests: No results for input(s): TSH, T4TOTAL, FREET4, T3FREE, THYROIDAB in the last 72 hours. Anemia Panel: No results for input(s): VITAMINB12, FOLATE, FERRITIN, TIBC, IRON, RETICCTPCT in the last 72 hours. Urine analysis:    Component Value Date/Time   COLORURINE YELLOW 05/29/2017 1832   APPEARANCEUR CLEAR 05/29/2017 1832   LABSPEC 1.006 05/29/2017 1832   PHURINE 6.0 05/29/2017 1832   GLUCOSEU NEGATIVE 05/29/2017 1832   GLUCOSEU NEG mg/dL 10/11/2006 2016   HGBUR NEGATIVE 05/29/2017 1832   BILIRUBINUR NEGATIVE 05/29/2017 1832     KETONESUR NEGATIVE 05/29/2017 1832   PROTEINUR NEGATIVE 05/29/2017 1832   UROBILINOGEN 1 10/11/2006 2016   NITRITE NEGATIVE 05/29/2017 1832   LEUKOCYTESUR NEGATIVE 05/29/2017 1832   Sepsis Labs: @LABRCNTIP (procalcitonin:4,lacticidven:4) )No results found for this or any previous visit (from the past 240 hour(s)).   Radiological Exams on Admission: Dg Chest 2 View  Result Date: 06/12/2017 CLINICAL DATA:  Chest pain and shortness of breath.  Fall. EXAM: CHEST  2 VIEW COMPARISON:  05/29/2017 FINDINGS: Normal heart size and pulmonary vascularity. No focal airspace disease or consolidation  in the lungs. No blunting of costophrenic angles. No pneumothorax. Mediastinal contours appear intact. IMPRESSION: No active cardiopulmonary disease. Electronically Signed   By: Lucienne Capers M.D.   On: 06/12/2017 03:31   Ct Head Wo Contrast  Result Date: 06/12/2017 CLINICAL DATA:  Fall with head trauma EXAM: CT HEAD WITHOUT CONTRAST CT CERVICAL SPINE WITHOUT CONTRAST TECHNIQUE: Multidetector CT imaging of the head and cervical spine was performed following the standard protocol without intravenous contrast. Multiplanar CT image reconstructions of the cervical spine were also generated. COMPARISON:  Brain MRI 12/02/2016 FINDINGS: CT HEAD FINDINGS Brain: No mass lesion, intraparenchymal hemorrhage or extra-axial collection. No evidence of acute cortical infarct. There is periventricular hypoattenuation compatible with chronic microvascular disease. Volume loss is greater than expected for age. Vascular: No hyperdense vessel or unexpected calcification. Skull: Normal visualized skull base, calvarium and extracranial soft tissues. Sinuses/Orbits: No sinus fluid levels or advanced mucosal thickening. No mastoid effusion. Normal orbits. CT CERVICAL SPINE FINDINGS Alignment: No static subluxation. Facets are aligned. Occipital condyles are normally positioned. Skull base and vertebrae: No acute fracture. Soft tissues  and spinal canal: No prevertebral fluid or swelling. No visible canal hematoma. Disc levels: Degenerative disease is greatest at C4-C5. No bony spinal canal stenosis. Upper chest: Biapical emphysema. Other: Normal visualized paraspinal cervical soft tissues. IMPRESSION: 1. Age advanced atrophy the and findings of chronic microvascular ischemia. 2. No fracture or static subluxation of the cervical spine. 3. Biapical Emphysema (ICD10-J43.9). Electronically Signed   By: Ulyses Jarred M.D.   On: 06/12/2017 03:34   Ct Cervical Spine Wo Contrast  Result Date: 06/12/2017 CLINICAL DATA:  Fall with head trauma EXAM: CT HEAD WITHOUT CONTRAST CT CERVICAL SPINE WITHOUT CONTRAST TECHNIQUE: Multidetector CT imaging of the head and cervical spine was performed following the standard protocol without intravenous contrast. Multiplanar CT image reconstructions of the cervical spine were also generated. COMPARISON:  Brain MRI 12/02/2016 FINDINGS: CT HEAD FINDINGS Brain: No mass lesion, intraparenchymal hemorrhage or extra-axial collection. No evidence of acute cortical infarct. There is periventricular hypoattenuation compatible with chronic microvascular disease. Volume loss is greater than expected for age. Vascular: No hyperdense vessel or unexpected calcification. Skull: Normal visualized skull base, calvarium and extracranial soft tissues. Sinuses/Orbits: No sinus fluid levels or advanced mucosal thickening. No mastoid effusion. Normal orbits. CT CERVICAL SPINE FINDINGS Alignment: No static subluxation. Facets are aligned. Occipital condyles are normally positioned. Skull base and vertebrae: No acute fracture. Soft tissues and spinal canal: No prevertebral fluid or swelling. No visible canal hematoma. Disc levels: Degenerative disease is greatest at C4-C5. No bony spinal canal stenosis. Upper chest: Biapical emphysema. Other: Normal visualized paraspinal cervical soft tissues. IMPRESSION: 1. Age advanced atrophy the and  findings of chronic microvascular ischemia. 2. No fracture or static subluxation of the cervical spine. 3. Biapical Emphysema (ICD10-J43.9). Electronically Signed   By: Ulyses Jarred M.D.   On: 06/12/2017 03:34     EKG: Not done in ED, will get one.   Assessment/Plan Principal Problem:   GIB (gastrointestinal bleeding) Active Problems:   Human immunodeficiency virus (HIV) disease ([facility])   Depression   Alcohol use disorder, severe, dependence ([place])   Paranoid schizophrenia, chronic condition ([place])   Tobacco abuse   PE (pulmonary thromboembolism) ([place])   DVT of lower extremity, bilateral ([place])   Fall   AKI (acute kidney injury) ([place])   Protein-calorie malnutrition, severe ([facility])   Chest pain   Nausea vomiting and diarrhea  GIB (gastrointestinal bleeding): Hemoglobin dropped from 12.1 on 9/5-->8.8.  Currently hemodynamically stable. Patient is taking ibuprofen. He is also drinking alcohol. Patient states that he had colonoscopy and EGD 5 years ago in [place]. He states that he had "little cancer" by colonoscopy. I could not find records for colonoscopy and EGD.  - will admit to tele bed as inpt - NPO - IVF: NS at 125 mL/hr - Start IV pantoprazole gtt - Zofran IV for nausea - Avoid NSAIDs and SQ heparin - Maintain IV access (2 large bore IVs if possible). - Monitor closely and follow q6h cbc, transfuse as necessary. - LaB: INR, PTT and type screen - please call GI in AM  Human immunodeficiency virus (HIV) disease ([place]): CD4 =50 and viral load 1.477 on 05/30/17.  -continue home HIV meds -pt is on Bactrim and fluconazole for prophylaxis  Hx of PE and DVT: pt is on Eliquis. Patient states that he has been compliant to taking Eliquis, did not miss any does. Now pt has shortness of breath and chest pain. Etiology is not clear. Patient has GI bleeding, will need to hold Eliquis temporarily. -will hold Eliquis  -will get V/Q scan and LE venous doppler to assess if pt still has PE and  DVT. If positive-->may need to place IVC filter -trop x 3 due to chest pain -prn NTG and Oxycodone for pain -prn albuterol nebs -continue home Spiriva inhaler  Fall: CT head and C-spine is negative. -PT/OT  AKI: pt's Cre is 1.73 and BUN 50 on admission. Likely due to prerenal secondary to dehydration and continuation of ACEI, NSAIDs and Bactrim. - IVF as above - Check FeNa  - Follow up renal function by BMP - Hold lisinopril and ibuprofen  Protein-calorie malnutrition, severe ([place]): -Nutrition consult  Nausea vomiting and diarrhea: Partly due to alcoholic gastritis. Viral gastroenteritis is also possible. Patient has a mild leukocytosis, but no fever. Clinically does not seem to have C. difficile colitis. -When necessary Zofran for nausea, oxycodone for pain -IV fluid as above.  HTN: -Continue metoprolol -Hold lisinopril due to acute renal injury -IV hydralazine when necessary  Schizophrenia and depression: -Continue haldol, Remeron,Valbenzine, Cogentin  Tobacco abuse and Alcohol abuse: -Did counseling about importance of quitting smoking -Nicotine patch -Did counseling about the importance of quitting drinking -CIWA protocol    DVT ppx: None (due to GI bleeding, cannot use heparin or Lovenox; due to history of DVT, cannot use SCD) Code Status: Full code Family Communication: None at bed side.   Disposition Plan:  Anticipate discharge back to previous home environment Consults called:  none Admission status:  Inpatient/tele      Date of Service 06/12/2017    Ivor Costa [facility] Pager [phone]  If 7PM-7AM, please contact night-coverage [email or web address] Password [facility] 06/12/2017, 6:46 AM

## 2017-06-12 NOTE — ED Provider Notes (Signed)
[facility] DEPT Provider Note   CSN: [id number] Arrival date & time: 06/12/17  0250     History   Chief Complaint Chief Complaint  Patient presents with  . Shortness of Breath  . Fall    HPI [name] Herring is a 56 y.o. male.  Patient presents to the emergency department with chief complaint of shortness of breath. He states that he has felt short of breath since 5 PM this afternoon. He reports that he feels fine when he is lying down resting, but as soon as he begins to move he becomes very winded. Additionally, he states that he stumbled and fell striking his head earlier today. He states that he had some difficulty getting up due to his fatigue. He complains of mild headache and neck pain. He states that his chest feels tight.  Takes eliquis for PE.   The history is provided by the patient. No language interpreter was used.    Past Medical History:  Diagnosis Date  . Depression   . HIV (human immunodeficiency virus infection) ([place])   . Hypertension   . Immune deficiency disorder ([place])   . PE (pulmonary thromboembolism) ([place]) 2018  . Personality disorder   . Schizophrenia [facility])     Patient Active Problem List   Diagnosis Date Noted  . Pneumonia 05/29/2017  . Emphysema lung ([place]) 11/20/2016  . Deep vein thrombosis (DVT) of left lower extremity ([place])   . DVT of lower extremity, bilateral ([place]) 11/04/2016  . Tobacco abuse 11/02/2016  . PE (pulmonary thromboembolism) ([place]) 11/02/2016  . Elevated lactic acid level 11/02/2016  . Acute respiratory failure with hypoxia ([place]) 07/17/2016  . CAP (community acquired pneumonia) 07/16/2016  . Cocaine dependence with cocaine-induced mood disorder ([place]) 03/29/2016  . HTN (hypertension) 08/25/2015  . Paranoid schizophrenia, chronic condition ([place]) 12/30/2014  . Cocaine use disorder, severe, dependence ([place]) 12/27/2014  . Alcohol use disorder, severe, dependence ([place]) 12/27/2014  . ERECTILE DYSFUNCTION 09/03/2007  . FOOT  PAIN, BILATERAL 07/10/2007  . Human immunodeficiency virus (HIV) disease ([place]) 11/09/2006  . VIRAL MENINGITIS 11/09/2006  . THRUSH 11/09/2006  . CA IN SITU, RECTUM 11/09/2006  . Hypothyroidism 11/09/2006  . DISORDER, BIPOLAR NOS 11/09/2006  . PYELONEPHRITIS 11/09/2006    Past Surgical History:  Procedure Laterality Date  . IR GENERIC HISTORICAL  11/04/2016   IR IVC FILTER PLMT / S&I Burke Keels GUID/MOD SED 11/04/2016 Aletta Edouard, MD [facility]-INTERV RAD       Home Medications    Prior to Admission medications   Medication Sig Start Date End Date Taking? Authorizing Provider  acetaminophen (TYLENOL) 500 MG tablet Take 1 tablet (500 mg total) by mouth every 6 (six) hours as needed for moderate pain. 11/07/16   Eugenie Filler, MD  benztropine (COGENTIN) 1 MG tablet 1 tab in the morning 2 tabs at night 11/10/16   McClung, Angela M, PA-C  ELIQUIS 2.5 MG TABS tablet TAKE ONE TABLET BY MOUTH TWICE DAILY 05/14/17   Arnoldo Morale, MD  elvitegravir-cobicistat-emtricitabine-tenofovir (GENVOYA) 150-150-200-10 MG TABS tablet TAKE 1 TABLET BY MOUTH DAILY WITH BREAKFAST. Christena Flake [phone] to see Dr Johnnye Sima ASAP* 07/17/16   Janece Canterbury, MD  fluconazole (DIFLUCAN) 100 MG tablet Take 1 tablet (100 mg total) by mouth once a week. 05/31/17 12/27/17  Caren Griffins, MD  gabapentin (NEURONTIN) 600 MG tablet Take 600 mg by mouth at bedtime.    [provider]  haloperidol (HALDOL) 10 MG tablet Take 10 mg by mouth 2 (two) times daily.  [provider]  ibuprofen (ADVIL,MOTRIN) 200 MG tablet Take 400 mg by mouth every 6 (six) hours as needed for moderate pain.     [provider]  lisinopril (PRINIVIL,ZESTRIL) 10 MG tablet Take 1 tablet (10 mg total) by mouth daily. 03/20/17   Arnoldo Morale, MD  magnesium oxide (MAG-OX) 400 (241.3 Mg) MG tablet Take 1 tablet (400 mg total) by mouth 2 (two) times daily. 11/07/16   Eugenie Filler, MD  methocarbamol (ROBAXIN-750) 750 MG tablet Take 1  tablet (750 mg total) by mouth every 8 (eight) hours as needed for muscle spasms. 03/20/17   Arnoldo Morale, MD  metoprolol tartrate (LOPRESSOR) 25 MG tablet Take 1 tablet (25 mg total) by mouth 2 (two) times daily. 03/20/17   Arnoldo Morale, MD  mirtazapine (REMERON) 15 MG tablet Take 15 mg by mouth at bedtime.    [provider]  Multiple Vitamin (MULTIVITAMIN WITH MINERALS) TABS tablet Take 1 tablet by mouth daily. 11/08/16   Eugenie Filler, MD  ondansetron (ZOFRAN) 4 MG tablet Take 1 tablet (4 mg total) by mouth every 6 (six) hours. 03/22/17   Montine Circle, PA-C  potassium chloride SA (K-DUR,KLOR-CON) 20 MEQ tablet TAKE ONE TABLET BY MOUTH THREE TIMES DAILY 02/01/17   Arnoldo Morale, MD  sulfamethoxazole-trimethoprim (BACTRIM DS) 800-160 MG tablet Take 1 tablet by mouth daily. 05/31/17   Caren Griffins, MD  tiotropium (SPIRIVA) 18 MCG inhalation capsule Place 1 capsule (18 mcg total) into inhaler and inhale daily. 11/08/16   Eugenie Filler, MD  Valbenazine Tosylate [facility]) 80 MG CAPS Take 80 mg by mouth 2 (two) times daily.     [provider]    Family History Family History  Problem Relation Age of Onset  . CAD Mother   . Kidney disease Sister   . Lupus Brother   . Cancer Maternal Grandmother   . Stroke Paternal Grandmother     Social History Social History  Substance Use Topics  . Smoking status: Current Every Day Smoker    Packs/day: 1.00    Years: 33.00    Types: Cigarettes  . Smokeless tobacco: Never Used  . Alcohol use 0.0 oz/week     Comment: Drank a fifth this AM, drinks 2x/month     Allergies   Amoxicillin and Latex   Review of Systems Review of Systems  All other systems reviewed and are negative.    Physical Exam Updated Vital Signs There were no vitals taken for this visit.  Physical Exam  Constitutional: He is oriented to person, place, and time. He appears well-developed and well-nourished.  HENT:  Head: Normocephalic and  atraumatic.  Eyes: Pupils are equal, round, and reactive to light. Conjunctivae and EOM are normal. Right eye exhibits no discharge. Left eye exhibits no discharge. No scleral icterus.  Neck: Normal range of motion. Neck supple. No JVD present.  Cardiovascular: Normal rate, regular rhythm and normal heart sounds.  Exam reveals no gallop and no friction rub.   No murmur heard. Pulmonary/Chest: Effort normal and breath sounds normal. No respiratory distress. He has no wheezes. He has no rales. He exhibits no tenderness.  Abdominal: Soft. He exhibits no distension and no mass. There is no tenderness. There is no rebound and no guarding.  Musculoskeletal: Normal range of motion. He exhibits no edema or tenderness.  Neurological: He is alert and oriented to person, place, and time.  Skin: Skin is warm and dry.  Psychiatric: He has a normal mood and  affect. His behavior is normal. Judgment and thought content normal.  Nursing note and vitals reviewed.    ED Treatments / Results  Labs (all labs ordered are listed, but only abnormal results are displayed) Labs Reviewed  CBC WITH DIFFERENTIAL/PLATELET  BASIC METABOLIC PANEL  TROPONIN I    EKG  EKG Interpretation None       Radiology No results found.  Procedures Procedures (including critical care time) CRITICAL CARE Performed by: Montine Circle   Total critical care time: 44 minutes  Critical care time was exclusive of separately billable procedures and treating other patients.  Critical care was necessary to treat or prevent imminent or life-threatening deterioration.  Critical care was time spent personally by me on the following activities: development of treatment plan with patient and/or surrogate as well as nursing, discussions with consultants, evaluation of patient's response to treatment, examination of patient, obtaining history from patient or surrogate, ordering and performing treatments and interventions, ordering  and review of laboratory studies, ordering and review of radiographic studies, pulse oximetry and re-evaluation of patient's condition.  Medications Ordered in ED Medications  ipratropium-albuterol (DUONEB) 0.5-2.5 (3) MG/3ML nebulizer solution 3 mL (not administered)     Initial Impression / Assessment and Plan / ED Course  I have reviewed the triage vital signs and the nursing notes.  Pertinent labs & imaging results that were available during my care of the patient were reviewed by me and considered in my medical decision making (see chart for details).     Patient with shortness of breath. States that he has been short of breath for the past several days. Denies any cough or fever. Hemoglobin is down 3 g from a recent visit this month. Patient has had some blood in his stools. He has some melanotic stool on DRE.  Patient will require admission. Will start him on Protonix drip. Patient may ultimately end up requiring transfusion. Will discuss with hospitalist.  Appreciate Dr. Blaine Hamper, for admitting the patient.  Final Clinical Impressions(s) / ED Diagnoses   Final diagnoses:  Shortness of breath  Gastrointestinal hemorrhage, unspecified gastrointestinal hemorrhage type  Chest pain    New Prescriptions New Prescriptions   No medications on file     Montine Circle, Hershal Coria 06/12/17 0604    Molpus, Jenny Reichmann, MD 06/12/17 [phone]

## 2017-06-12 NOTE — Progress Notes (Signed)
VASCULAR LAB PRELIMINARY  PRELIMINARY  PRELIMINARY  PRELIMINARY  Bilateral lower extremity venous duplex completed.    Preliminary report: Bilateral:  No evidence of DVT, superficial thrombosis, or Baker's Cyst. Previous DVTs appear to have resolved.   [name] Herring, Will, RVS 06/12/2017, 9:40 AM

## 2017-06-12 NOTE — Progress Notes (Signed)
PT Cancellation Note  Patient Details Name: [name] Herring MRN: [id number] DOB: [birth year]   Cancelled Treatment:    Reason Eval/Treat Not Completed: Patient not medically ready, noted HGB 6.1. Will check back another time.   Marcelino Freestone PT [phone]   06/12/2017, 2:58 PM

## 2017-06-12 NOTE — ED Notes (Signed)
Bed: WP10 Expected date:  Expected time:  Means of arrival:  Comments: 27 yr short of breath

## 2017-06-12 NOTE — ED Triage Notes (Signed)
Pt coming from home complaining of shortness of breath which began at 5pm, pt also reports falling about 1 hour ago while trying to stand up. Pt states the fall was related to not being able to breathe properly. Lungs clear. 96% on RA.

## 2017-06-12 NOTE — Progress Notes (Signed)
CRITICAL VALUE ALERT  Critical Value:  Hgb 6.1  Date & Time Notied:  06/12/2017 1435  Provider Notified: [place]  Orders Received/Actions taken: spoke with MD- continue blood as ordered

## 2017-06-12 NOTE — ED Notes (Signed)
Patient transported to Ultrasound 

## 2017-06-13 ENCOUNTER — Inpatient Hospital Stay (HOSPITAL_COMMUNITY): Payer: Medicare Other

## 2017-06-13 ENCOUNTER — Ambulatory Visit: Payer: Self-pay | Admitting: *Deleted

## 2017-06-13 DIAGNOSIS — K5900 Constipation, unspecified: Secondary | ICD-10-CM

## 2017-06-13 DIAGNOSIS — Z7189 Other specified counseling: Secondary | ICD-10-CM

## 2017-06-13 DIAGNOSIS — K6289 Other specified diseases of anus and rectum: Secondary | ICD-10-CM

## 2017-06-13 DIAGNOSIS — R935 Abnormal findings on diagnostic imaging of other abdominal regions, including retroperitoneum: Secondary | ICD-10-CM

## 2017-06-13 DIAGNOSIS — Z791 Long term (current) use of non-steroidal anti-inflammatories (NSAID): Secondary | ICD-10-CM

## 2017-06-13 DIAGNOSIS — Z7901 Long term (current) use of anticoagulants: Secondary | ICD-10-CM

## 2017-06-13 DIAGNOSIS — F1020 Alcohol dependence, uncomplicated: Secondary | ICD-10-CM

## 2017-06-13 DIAGNOSIS — Z515 Encounter for palliative care: Secondary | ICD-10-CM

## 2017-06-13 DIAGNOSIS — R1084 Generalized abdominal pain: Secondary | ICD-10-CM

## 2017-06-13 DIAGNOSIS — B20 Human immunodeficiency virus [HIV] disease: Secondary | ICD-10-CM

## 2017-06-13 DIAGNOSIS — D689 Coagulation defect, unspecified: Secondary | ICD-10-CM

## 2017-06-13 DIAGNOSIS — D62 Acute posthemorrhagic anemia: Secondary | ICD-10-CM

## 2017-06-13 DIAGNOSIS — K921 Melena: Secondary | ICD-10-CM

## 2017-06-13 DIAGNOSIS — I361 Nonrheumatic tricuspid (valve) insufficiency: Secondary | ICD-10-CM

## 2017-06-13 LAB — CBC
HCT: 24.2 % — ABNORMAL LOW (ref 39.0–52.0)
HCT: 24.4 % — ABNORMAL LOW (ref 39.0–52.0)
HEMATOCRIT: 22.9 % — AB (ref 39.0–52.0)
HEMOGLOBIN: 8.2 g/dL — AB (ref 13.0–17.0)
HEMOGLOBIN: 7.8 g/dL — AB (ref 13.0–17.0)
Hemoglobin: 8.4 g/dL — ABNORMAL LOW (ref 13.0–17.0)
MCH: 31.2 pg (ref 26.0–34.0)
MCH: 30.1 pg (ref 26.0–34.0)
MCH: 31.2 pg (ref 26.0–34.0)
MCHC: 34.7 g/dL (ref 30.0–36.0)
MCHC: 33.6 g/dL (ref 30.0–36.0)
MCHC: 34.1 g/dL (ref 30.0–36.0)
MCV: 90 fL (ref 78.0–100.0)
MCV: 89.7 fL (ref 78.0–100.0)
MCV: 91.6 fL (ref 78.0–100.0)
PLATELETS: 108 10*3/uL — AB (ref 150–400)
Platelets: 113 10*3/uL — ABNORMAL LOW (ref 150–400)
Platelets: 104 10*3/uL — ABNORMAL LOW (ref 150–400)
RBC: 2.69 MIL/uL — ABNORMAL LOW (ref 4.22–5.81)
RBC: 2.72 MIL/uL — ABNORMAL LOW (ref 4.22–5.81)
RBC: 2.5 MIL/uL — ABNORMAL LOW (ref 4.22–5.81)
RDW: 20 % — ABNORMAL HIGH (ref 11.5–15.5)
RDW: 19.9 % — ABNORMAL HIGH (ref 11.5–15.5)
RDW: 20.2 % — AB (ref 11.5–15.5)
WBC: 6.3 10*3/uL (ref 4.0–10.5)
WBC: 5.5 10*3/uL (ref 4.0–10.5)
WBC: 5.6 10*3/uL (ref 4.0–10.5)

## 2017-06-13 LAB — BASIC METABOLIC PANEL
Anion gap: 6 (ref 5–15)
BUN: 28 mg/dL — AB (ref 6–20)
CHLORIDE: 114 mmol/L — AB (ref 101–111)
CO2: 22 mmol/L (ref 22–32)
CREATININE: 1.03 mg/dL (ref 0.61–1.24)
Calcium: 8.4 mg/dL — ABNORMAL LOW (ref 8.9–10.3)
GFR calc Af Amer: >60 mL/min (ref >60)
GFR calc non Af Amer: >60 mL/min (ref >60)
Glucose, Bld: 83 mg/dL (ref 65–99)
POTASSIUM: 4.4 mmol/L (ref 3.5–5.1)
Sodium: 142 mmol/L (ref 135–145)

## 2017-06-13 LAB — ECHOCARDIOGRAM COMPLETE
Height: 70 in
Weight: 2042.34 oz

## 2017-06-13 LAB — TROPONIN I

## 2017-06-13 LAB — OCCULT BLOOD, POC DEVICE: FECAL OCCULT BLD: POSITIVE — AB

## 2017-06-13 LAB — GLUCOSE, CAPILLARY: Glucose-Capillary: 82 mg/dL (ref 65–99)

## 2017-06-13 LAB — BRAIN NATRIURETIC PEPTIDE: B Natriuretic Peptide: 51.1 pg/mL (ref 0.0–100.0)

## 2017-06-13 MED ORDER — PEG-KCL-NACL-NASULF-NA ASC-C 100 G PO SOLR: 1.0000 | Freq: Once | ORAL | Status: DC

## 2017-06-13 MED ORDER — SODIUM CHLORIDE 0.9 % IV SOLN
INTRAVENOUS | Status: DC
  Administered 2017-06-13: 23:00:00 via INTRAVENOUS

## 2017-06-13 MED ORDER — PEG-KCL-NACL-NASULF-NA ASC-C 100 G PO SOLR
0.5000 | Freq: Once | ORAL | Status: AC
  Administered 2017-06-15: 100 g via ORAL

## 2017-06-13 MED ORDER — PEG-KCL-NACL-NASULF-NA ASC-C 100 G PO SOLR
0.5000 | Freq: Once | ORAL | Status: AC
  Administered 2017-06-14: 100 g via ORAL
  Filled 2017-06-13 (×2): qty 1

## 2017-06-13 MED ORDER — BENZTROPINE MESYLATE 0.5 MG PO TABS
0.5000 mg | ORAL_TABLET | Freq: Every day | ORAL | Status: DC
  Administered 2017-06-14 – 2017-06-18 (×4): 0.5 mg via ORAL
  Filled 2017-06-13 (×4): qty 1

## 2017-06-13 MED ORDER — HALOPERIDOL 5 MG PO TABS
5.0000 mg | ORAL_TABLET | Freq: Two times a day (BID) | ORAL | Status: DC
  Administered 2017-06-13: 5 mg via ORAL
  Filled 2017-06-13 (×2): qty 1

## 2017-06-13 MED ORDER — SODIUM CHLORIDE 0.9 % IV SOLN: INTRAVENOUS | Status: AC

## 2017-06-13 MED ORDER — BENZTROPINE MESYLATE 0.5 MG PO TABS
1.0000 mg | ORAL_TABLET | Freq: Every day | ORAL | Status: DC
  Administered 2017-06-13 – 2017-06-17 (×5): 1 mg via ORAL
  Filled 2017-06-13 (×5): qty 2

## 2017-06-13 MED ORDER — PEG-KCL-NACL-NASULF-NA ASC-C 100 G PO SOLR: 0.5000 | Freq: Once | ORAL | Status: DC

## 2017-06-13 MED ORDER — PEG-KCL-NACL-NASULF-NA ASC-C 100 G PO SOLR
0.5000 | Freq: Once | ORAL | Status: DC
  Filled 2017-06-13: qty 1

## 2017-06-13 NOTE — Care Management Note (Signed)
Case Management Note  Patient Details  Name: [name] Herring MRN: [id number] Date of Birth: Jan 19, 1961  Subjective/Objective:                  pt was found to have positive FOBT, hemoglobin dropped from 11.2 on 05/30/17-->8.8, WBC 11.6, negative troponin, acute renal injury with creatinine 1.73, temperature normal, tachycardia, tachypnea, oxygen saturation 96% on room air, negative chest x-ray. Negative CT head and C-spine.   Action/Plan: Date:  June 13, 2017 Chart reviewed for concurrent status and case management needs. Will continue to follow patient progress. Discharge Planning: following for needs Expected discharge date: [id number] Velva Harman, BSN, [place], [place]  Expected Discharge Date:   (unknown)               Expected Discharge Plan:  Home/Self Care  In-House Referral:     Discharge planning Services  CM Consult  Post Acute Care Choice:    Choice offered to:     DME Arranged:    DME Agency:     HH Arranged:    [place] Agency:     Status of Service:  In process, will continue to follow  If discussed at Long Length of Stay Meetings, dates discussed:    Additional Comments:  Leeroy Cha, RN 06/13/2017, 9:02 AM

## 2017-06-13 NOTE — Progress Notes (Signed)
PROGRESS NOTE    [name] Herring  BJY:[id number] DOB: February 23, 1961 DOA: 06/12/2017 PCP: Arnoldo Morale, MD  Brief Narrative:[name] Herring a 56 y.o.malewith medical history significant of hypertension, depression, polysubstance abuse (tobacco, alcohol, cocaine), DVT, PE on eliquis. , HIV (CD4 to 50 and viral load 1.477 on 05/30/17), schizophrenia, immunodeficiency disorder, VERY POOR HISTORIAN who presented with chest pain, shortness breath, nausea, vomiting, diarrhea, abdominal pain, rectal bleeding and anemia.   Assessment & Plan:   GI Bleed/Anemia/heme pos stools -Status post 2 units of PRBC since admission -History of NSAID and alcohol abuse -GI consulting, plan for endoscopy once eliquis washed out -Monitor hemoglobin  Human immunodeficiency virus (HIV) disease ([place]): CD4 =50 and viral load 1.477 on 05/30/17.  -continue home HIV meds -pt is on Bactrim and fluconazole forprophylaxis  Hx of PE and DVT: pt is on Eliquis -Patient reports compliance with our office but this is likely questionable -VQ scan with low probability of PE and history of IVC filte -currently holding Eliquis in setting of GI bleeding  Chest pain -resolved -troponin negative x3 -EKG abnormal with ST changes will check ECHO  AKI: pt's Cre is 1.73 and BUN 50 on admission. Likely due to prerenal secondary to dehydration and continuation of ACEI, NSAIDs and Bactrim. -continue IVF today with soft BPs  Protein-calorie malnutrition, severe ([place]): -Nutrition consult  Nausea vomiting and diarrhea: -? alcoholicgastritis. Viral gastroenteritis is also possible. Patient has a mild leukocytosis, but no fever.  -Clinically does not seem to have C. difficile colitis. -supportive care -improving, start clears  HTN: -Continue metoprolol -Hold lisinopril  Schizophrenia and depression: -Continue haldol, Remeron,Valbenzine, Cogentin -will cut down doses of COgentin and haldol due to increased  somolence  Tobacco abuse and Alcohol abuse: -Nicotine patch -CIWA protocol  Prophylaxis: SCD Code Status: Full code.  Family Communication:  Disposition Plan: remain inpatient.   Consultants:   [facility]  Subjective: Feels ok, one episode of bleeding overnight  Objective: Vitals:   06/13/17 0801 06/13/17 1006 06/13/17 1103 06/13/17 1206  BP:   90/66 (!) 92/59  Pulse:   (!) 54 (!) 51  Resp:   15 10  Temp: 97.9 F (36.6 C)     TempSrc: Oral     SpO2:  98% 100% 100%  Weight:      Height:        Intake/Output Summary (Last 24 hours) at 06/13/17 1213 Last data filed at 06/13/17 0400  Gross per 24 hour  Intake          2756.84 ml  Output               75 ml  Net          2681.84 ml   Filed Weights   06/12/17 1326 06/13/17 0519  Weight: 53.9 kg (118 lb 13.3 oz) 57.9 kg (127 lb 10.3 oz)    Examination:  General exam: Appears calm and comfortable  Respiratory system: Clear to auscultation. Respiratory effort normal. Cardiovascular system: S1 & S2 heard, RRR. No JVD, murmurs Gastrointestinal system: Abdomen is nondistended, soft and nontender.  Normal bowel sounds heard. Central nervous system: Alert and oriented. No focal neurological deficits. Extremities: Symmetric 5 x 5 power. Skin: No rashes, lesions or ulcers Psychiatry: Judgement and insight appear normal. Mood & affect appropriate.     Data Reviewed:   CBC:  Recent Labs Lab 06/12/17 0326 06/12/17 0533 06/12/17 1322 06/13/17 0316 06/13/17 0823  WBC 11.6* 10.5 6.8 6.3 5.5  NEUTROABS 9.7*  --   --   --   --  HGB 8.1* 7.4* 6.1* 8.4* 8.2*  HCT 23.6* 21.5* 17.5* 24.2* 24.4*  MCV 89.1 88.5 88.4 90.0 89.7  PLT 159 151 125* 108* 546*   Basic Metabolic Panel:  Recent Labs Lab 06/12/17 0326 06/13/17 0316  NA 136 142  K 4.2 4.4  CL 105 114*  CO2 22 22  GLUCOSE 145* 83  BUN 50* 28*  CREATININE 1.73* 1.03  CALCIUM 9.1 8.4*   GFR: Estimated Creatinine Clearance: 66.4 mL/min (by C-G formula  based on SCr of 1.03 mg/dL). Liver Function Tests: No results for input(s): AST, ALT, ALKPHOS, BILITOT, PROT, ALBUMIN in the last 168 hours. No results for input(s): LIPASE, AMYLASE in the last 168 hours. No results for input(s): AMMONIA in the last 168 hours. Coagulation Profile:  Recent Labs Lab 06/12/17 0533  INR 1.03   Cardiac Enzymes:  Recent Labs Lab 06/12/17 0326 06/12/17 0533 06/12/17 1322 06/13/17 0316  TROPONINI <0.03 <0.03 <0.03 <0.03   BNP (last 3 results) No results for input(s): PROBNP in the last 8760 hours. HbA1C: No results for input(s): HGBA1C in the last 72 hours. CBG:  Recent Labs Lab 06/12/17 0824 06/13/17 0737  GLUCAP 117* 82   Lipid Profile: No results for input(s): CHOL, HDL, LDLCALC, TRIG, CHOLHDL, LDLDIRECT in the last 72 hours. Thyroid Function Tests: No results for input(s): TSH, T4TOTAL, FREET4, T3FREE, THYROIDAB in the last 72 hours. Anemia Panel: No results for input(s): VITAMINB12, FOLATE, FERRITIN, TIBC, IRON, RETICCTPCT in the last 72 hours. Urine analysis:    Component Value Date/Time   COLORURINE YELLOW 05/29/2017 1832   APPEARANCEUR CLEAR 05/29/2017 1832   LABSPEC 1.006 05/29/2017 1832   PHURINE 6.0 05/29/2017 1832   GLUCOSEU NEGATIVE 05/29/2017 1832   GLUCOSEU NEG mg/dL 10/11/2006 2016   HGBUR NEGATIVE 05/29/2017 1832   BILIRUBINUR NEGATIVE 05/29/2017 1832   KETONESUR NEGATIVE 05/29/2017 1832   PROTEINUR NEGATIVE 05/29/2017 1832   UROBILINOGEN 1 10/11/2006 2016   NITRITE NEGATIVE 05/29/2017 1832   LEUKOCYTESUR NEGATIVE 05/29/2017 1832   Sepsis Labs: _0 (procalcitonin:4,lacticidven:4)  ) Recent Results (from the past 240 hour(s))  MRSA PCR Screening     Status: None   Collection Time: 06/12/17  2:35 PM  Result Value Ref Range Status   MRSA by PCR NEGATIVE NEGATIVE Final    Comment:        The GeneXpert MRSA Assay (FDA approved for NASAL specimens only), is one component of a comprehensive MRSA  colonization surveillance program. It is not intended to diagnose MRSA infection nor to guide or monitor treatment for MRSA infections.          Radiology Studies: Dg Chest 2 View  Result Date: 06/12/2017 CLINICAL DATA:  Chest pain and shortness of breath.  Fall. EXAM: CHEST  2 VIEW COMPARISON:  05/29/2017 FINDINGS: Normal heart size and pulmonary vascularity. No focal airspace disease or consolidation in the lungs. No blunting of costophrenic angles. No pneumothorax. Mediastinal contours appear intact. IMPRESSION: No active cardiopulmonary disease. Electronically Signed   By: Lucienne Capers M.D.   On: 06/12/2017 03:31   Ct Head Wo Contrast  Result Date: 06/12/2017 CLINICAL DATA:  Fall with head trauma EXAM: CT HEAD WITHOUT CONTRAST CT CERVICAL SPINE WITHOUT CONTRAST TECHNIQUE: Multidetector CT imaging of the head and cervical spine was performed following the standard protocol without intravenous contrast. Multiplanar CT image reconstructions of the cervical spine were also generated. COMPARISON:  Brain MRI 12/02/2016 FINDINGS: CT HEAD FINDINGS Brain: No mass lesion, intraparenchymal hemorrhage or extra-axial collection. No evidence of acute  cortical infarct. There is periventricular hypoattenuation compatible with chronic microvascular disease. Volume loss is greater than expected for age. Vascular: No hyperdense vessel or unexpected calcification. Skull: Normal visualized skull base, calvarium and extracranial soft tissues. Sinuses/Orbits: No sinus fluid levels or advanced mucosal thickening. No mastoid effusion. Normal orbits. CT CERVICAL SPINE FINDINGS Alignment: No static subluxation. Facets are aligned. Occipital condyles are normally positioned. Skull base and vertebrae: No acute fracture. Soft tissues and spinal canal: No prevertebral fluid or swelling. No visible canal hematoma. Disc levels: Degenerative disease is greatest at C4-C5. No bony spinal canal stenosis. Upper chest:  Biapical emphysema. Other: Normal visualized paraspinal cervical soft tissues. IMPRESSION: 1. Age advanced atrophy the and findings of chronic microvascular ischemia. 2. No fracture or static subluxation of the cervical spine. 3. Biapical Emphysema (ICD10-J43.9). Electronically Signed   By: Ulyses Jarred M.D.   On: 06/12/2017 03:34   Ct Cervical Spine Wo Contrast  Result Date: 06/12/2017 CLINICAL DATA:  Fall with head trauma EXAM: CT HEAD WITHOUT CONTRAST CT CERVICAL SPINE WITHOUT CONTRAST TECHNIQUE: Multidetector CT imaging of the head and cervical spine was performed following the standard protocol without intravenous contrast. Multiplanar CT image reconstructions of the cervical spine were also generated. COMPARISON:  Brain MRI 12/02/2016 FINDINGS: CT HEAD FINDINGS Brain: No mass lesion, intraparenchymal hemorrhage or extra-axial collection. No evidence of acute cortical infarct. There is periventricular hypoattenuation compatible with chronic microvascular disease. Volume loss is greater than expected for age. Vascular: No hyperdense vessel or unexpected calcification. Skull: Normal visualized skull base, calvarium and extracranial soft tissues. Sinuses/Orbits: No sinus fluid levels or advanced mucosal thickening. No mastoid effusion. Normal orbits. CT CERVICAL SPINE FINDINGS Alignment: No static subluxation. Facets are aligned. Occipital condyles are normally positioned. Skull base and vertebrae: No acute fracture. Soft tissues and spinal canal: No prevertebral fluid or swelling. No visible canal hematoma. Disc levels: Degenerative disease is greatest at C4-C5. No bony spinal canal stenosis. Upper chest: Biapical emphysema. Other: Normal visualized paraspinal cervical soft tissues. IMPRESSION: 1. Age advanced atrophy the and findings of chronic microvascular ischemia. 2. No fracture or static subluxation of the cervical spine. 3. Biapical Emphysema (ICD10-J43.9). Electronically Signed   By: Ulyses Jarred  M.D.   On: 06/12/2017 03:34   Nm Pulmonary Perf And Vent  Result Date: 06/12/2017 CLINICAL DATA:  Shortness of breath, chest pain. EXAM: NUCLEAR MEDICINE VENTILATION - PERFUSION LUNG SCAN TECHNIQUE: Ventilation images were obtained in multiple projections using inhaled aerosol Tc-82mDTPA. Perfusion images were obtained in multiple projections after intravenous injection of Tc-996mAA. RADIOPHARMACEUTICALS:  28.9 mCi Technetium-9972mPA aerosol inhalation and 4.8 mCi Technetium-59m13m IV COMPARISON:  Chest x-ray 06/12/2017 FINDINGS: Ventilation: Patchy diffuse nonsegmental ventilation defects. Perfusion: Matching patchy diffuse nonsegmental perfusion defects. IMPRESSION: Diffuse nonsegmental ventilation and perfusion defects throughout the lungs, most compatible with obstructive lung disease. Low probability for pulmonary embolus. Electronically Signed   By: KeviRolm Baptise.   On: 06/12/2017 12:10        Scheduled Meds: . benztropine  1 mg Oral Daily   And  . benztropine  2 mg Oral QHS  . dextromethorphan-guaiFENesin  1 tablet Oral BID  . elvitegravir-cobicistat-emtricitabine-tenofovir  1 tablet Oral Q breakfast  . fluconazole  100 mg Oral Weekly  . folic acid  1 mg Oral Daily  . gabapentin  600 mg Oral QHS  . haloperidol  10 mg Oral BID  . LORazepam  0-4 mg Intravenous Q6H   Followed by  . [START ON 06/14/2017] LORazepam  0-4 mg Intravenous Q12H  . magnesium oxide  400 mg Oral BID  . metoprolol tartrate  25 mg Oral BID  . mirtazapine  15 mg Oral QHS  . multivitamin with minerals  1 tablet Oral Daily  . nicotine  21 mg Transdermal Daily  . [START ON 06/15/2017] pantoprazole  40 mg Intravenous Q12H  . [START ON 06/14/2017] peg 3350 powder  0.5 kit Oral Once   And  . [START ON 06/15/2017] peg 3350 powder  0.5 kit Oral Once  . sulfamethoxazole-trimethoprim  1 tablet Oral Daily  . thiamine  100 mg Oral Daily   Or  . thiamine  100 mg Intravenous Daily  . tiotropium  18 mcg Inhalation  Daily  . Valbenazine Tosylate  80 mg Oral BID   Continuous Infusions: . sodium chloride 75 mL/hr at 06/13/17 0745  . pantoprozole (PROTONIX) infusion 8 mg/hr (06/13/17 0400)     LOS: 1 day    Time spent: 76mn    PDomenic Polite MD [facility] Pager [phone] If 7PM-7AM, please contact night-coverage [email or web address] Password [email or web address] 06/13/2017, 12:13 PM

## 2017-06-13 NOTE — Consult Note (Signed)
Consultation Note Date: 06/13/2017   Patient Name: [name] Herring  DOB: 1961/04/13  MRN: [id number]  Age / Sex: 56 y.o., male  PCP: Arnoldo Morale, MD Referring Physician: Domenic Polite, MD  Reason for Consultation: Establishing goals of care  HPI/Patient Profile: 56 y.o. male  admitted on 06/12/2017    Clinical Assessment and Goals of Care:  56 year old gentleman with extensive past medical history significant for HIV infection, history of cocaine use polysubstance use, history of hospitalization in February 2018 for submassive pulmonary embolism and right heart strain after which he had IVC filter placed and was placed on oral anticoagulation with Eliquis, history of ongoing alcohol use.  It is noted that the patient was hospitalized earlier this month at [facility] for possible aspiration pneumonia.  Patient has been admitted to the hospitalist service with GI bleed, rectal bleeding, there is underlying concern for possible history of rectal carcinoma.  A palliative consultation has been requested for goals of care discussions.  The patient is resting in bed. He is extremely sleepy. He opens his eyes and engages somewhat. He states that his sister [name] should be called and that she is well aware of his wishes and goals.  Call placed and discussed with patient's sister [name] at [phone]. She states the patient has had ongoing weakness since his recent discharge when he was hospitalized for pneumonia. Additionally, she has noted that the patient has had ongoing rectal bleeding. He has had evidence of ongoing bleeding on various items of clothing at his apartment. Patient's sister states that the patient was trying to hide his rectal bleeding from her.  Patient's sister [name] states that she has been his power of attorney for a long time now. Patient reportedly has adult children who live  in [place] but they are not directly involved in his care. Ms. [name] notes that the patient has already discussed with her on several occasions about how he would not want to be kept alive by artificial means. He does not want CPR he does not want aggressive interventions at end-of-life. He has already made his wishes known to her that he would want to be cremated.  See recommendations below. Thank you for the consult. We will continue to follow along and help guide appropriate decision-making.  HCPOA  Sister. [name] at [phone]  SUMMARY OF RECOMMENDATIONS    CODE STATUS now established as DO NOT RESUSCITATE/DO NOT INTUBATE this is based on the patient's previously expressed wishes to his healthcare power of attorney sister.  Continue current mode of care, gentle measures for now.  Follow hospital course. Will have to engage in hospice discussions based on patient's disease trajectory. Briefly discussed hospice philosophy of care with sister healthcare power of attorney agent Ms. [name].   Code Status/Advance Care Planning:  DNR    Symptom Management:    Continue current mode of care  Palliative Prophylaxis:   Delirium Protocol  Psycho-social/Spiritual:   Desire for further Chaplaincy support:yes  Additional Recommendations: Education on Hospice  Prognosis:  Unable to determine  Discharge Planning: To Be Determined      Primary Diagnoses: Present on Admission: . Alcohol use disorder, severe, dependence ([place]) . DVT of lower extremity, bilateral ([place]) . Human immunodeficiency virus (HIV) disease ([place]) . PE (pulmonary thromboembolism) ([place]) . Tobacco abuse . GIB (gastrointestinal bleeding) . Fall . AKI (acute kidney injury) ([place]) . Protein-calorie malnutrition, severe ([place]) . Chest pain . Nausea vomiting and diarrhea . Paranoid schizophrenia, chronic condition ([place]) . Depression . GI bleed   I have reviewed the medical record, interviewed the  patient and family, and examined the patient. The following aspects are pertinent.  Past Medical History:  Diagnosis Date  . Depression   . HIV (human immunodeficiency virus infection) ([place])   . Hypertension   . Immune deficiency disorder ([place])   . PE (pulmonary thromboembolism) ([place]) 2018  . Personality disorder   . Schizophrenia [facility])    Social History   Social History  . Marital status: Divorced    Spouse name: N/A  . Number of children: N/A  . Years of education: N/A   Occupational History  . disabled    Social History Main Topics  . Smoking status: Current Every Day Smoker    Packs/day: 1.00    Years: 33.00    Types: Cigarettes  . Smokeless tobacco: Never Used  . Alcohol use 0.0 oz/week     Comment: Drank a fifth this AM, drinks 2x/month  . Drug use: No     Comment: last use summer 2017  . Sexual activity: No     Comment: given condoms   Other Topics Concern  . None   Social History Narrative  . None   Family History  Problem Relation Age of Onset  . CAD Mother   . Kidney disease Sister   . Lupus Brother   . Cancer Maternal Grandmother   . Stroke Paternal Grandmother    Scheduled Meds: . benztropine  1 mg Oral Daily   And  . benztropine  2 mg Oral QHS  . dextromethorphan-guaiFENesin  1 tablet Oral BID  . elvitegravir-cobicistat-emtricitabine-tenofovir  1 tablet Oral Q breakfast  . fluconazole  100 mg Oral Weekly  . folic acid  1 mg Oral Daily  . gabapentin  600 mg Oral QHS  . haloperidol  10 mg Oral BID  . LORazepam  0-4 mg Intravenous Q6H   Followed by  . [START ON 06/14/2017] LORazepam  0-4 mg Intravenous Q12H  . magnesium oxide  400 mg Oral BID  . metoprolol tartrate  25 mg Oral BID  . mirtazapine  15 mg Oral QHS  . multivitamin with minerals  1 tablet Oral Daily  . nicotine  21 mg Transdermal Daily  . [START ON 06/15/2017] pantoprazole  40 mg Intravenous Q12H  . [START ON 06/14/2017] peg 3350 powder  0.5 kit Oral Once   And  . [START ON  06/15/2017] peg 3350 powder  0.5 kit Oral Once  . sulfamethoxazole-trimethoprim  1 tablet Oral Daily  . thiamine  100 mg Oral Daily   Or  . thiamine  100 mg Intravenous Daily  . tiotropium  18 mcg Inhalation Daily  . Valbenazine Tosylate  80 mg Oral BID   Continuous Infusions: . sodium chloride 75 mL/hr at 06/13/17 0745  . pantoprozole (PROTONIX) infusion 8 mg/hr (06/13/17 0400)   PRN Meds:.acetaminophen, albuterol, hydrALAZINE, LORazepam **OR** LORazepam, methocarbamol, nitroGLYCERIN, ondansetron **OR** ondansetron (ZOFRAN) IV, oxyCODONE, zolpidem Medications Prior to Admission:  Prior to Admission medications  Medication Sig Start Date End Date Taking? Authorizing Provider  acetaminophen (TYLENOL) 500 MG tablet Take 1 tablet (500 mg total) by mouth every 6 (six) hours as needed for moderate pain. 11/07/16  Yes Eugenie Filler, MD  benztropine (COGENTIN) 1 MG tablet 1 tab in the morning 2 tabs at night 11/10/16  Yes McClung, Angela M, PA-C  ELIQUIS 2.5 MG TABS tablet TAKE ONE TABLET BY MOUTH TWICE DAILY 05/14/17  Yes Amao, Enobong, MD  elvitegravir-cobicistat-emtricitabine-tenofovir (GENVOYA) 150-150-200-10 MG TABS tablet TAKE 1 TABLET BY MOUTH DAILY WITH BREAKFAST. *CALL [phone] to see Dr Johnnye Sima ASAP* 07/17/16  Yes Short, Noah Delaine, MD  fluconazole (DIFLUCAN) 100 MG tablet Take 1 tablet (100 mg total) by mouth once a week. 05/31/17 12/27/17 Yes Gherghe, Vella Redhead, MD  gabapentin (NEURONTIN) 600 MG tablet Take 600 mg by mouth at bedtime.   Yes [provider]  haloperidol (HALDOL) 10 MG tablet Take 10 mg by mouth 2 (two) times daily.   Yes [provider]  ibuprofen (ADVIL,MOTRIN) 200 MG tablet Take 400 mg by mouth every 6 (six) hours as needed for moderate pain.    Yes [provider]  lisinopril (PRINIVIL,ZESTRIL) 10 MG tablet Take 1 tablet (10 mg total) by mouth daily. 03/20/17  Yes Arnoldo Morale, MD  magnesium oxide (MAG-OX) 400 (241.3 Mg) MG tablet Take 1  tablet (400 mg total) by mouth 2 (two) times daily. 11/07/16  Yes Eugenie Filler, MD  methocarbamol (ROBAXIN-750) 750 MG tablet Take 1 tablet (750 mg total) by mouth every 8 (eight) hours as needed for muscle spasms. 03/20/17  Yes Arnoldo Morale, MD  metoprolol tartrate (LOPRESSOR) 25 MG tablet Take 1 tablet (25 mg total) by mouth 2 (two) times daily. 03/20/17  Yes Arnoldo Morale, MD  mirtazapine (REMERON) 15 MG tablet Take 15 mg by mouth at bedtime.   Yes [provider]  Multiple Vitamin (MULTIVITAMIN WITH MINERALS) TABS tablet Take 1 tablet by mouth daily. 11/08/16  Yes Eugenie Filler, MD  ondansetron (ZOFRAN) 4 MG tablet Take 1 tablet (4 mg total) by mouth every 6 (six) hours. 03/22/17  Yes Montine Circle, PA-C  potassium chloride SA (K-DUR,KLOR-CON) 20 MEQ tablet TAKE ONE TABLET BY MOUTH THREE TIMES DAILY 02/01/17  Yes Arnoldo Morale, MD  sulfamethoxazole-trimethoprim (BACTRIM DS) 800-160 MG tablet Take 1 tablet by mouth daily. 05/31/17  Yes Caren Griffins, MD  tiotropium (SPIRIVA) 18 MCG inhalation capsule Place 1 capsule (18 mcg total) into inhaler and inhale daily. 11/08/16  Yes Eugenie Filler, MD  Valbenazine Tosylate [facility]) 80 MG CAPS Take 80 mg by mouth 2 (two) times daily.    Yes [provider]   Allergies  Allergen Reactions  . Amoxicillin Shortness Of Breath and Rash    Has patient had a PCN reaction causing immediate rash, facial/tongue/throat swelling, [name] Has patient had a PCN reaction causing severe rash involving mucus membranes or skin necrosis: no Has patient had a PCN reaction that required hospitalization: yes drs office visit Has patient had a PCN reaction occurring within the last 10 years: no If all of the above answers are "NO", then may proceed with Cephalosporin use.   . Latex Shortness Of Breath   Review of Systems Feels sleepy  Physical Exam Cachectic, older than stated age appearing  gentleman resting in bed appears very sleepy opens eyes engages somewhat but really does prefer just to sleep at the moment Regular breath sounds S1-S2 Abdomen soft mildly distended  nontender No edema  muscle wasting  Vital Signs: BP (!) 92/59   Pulse (!) 51   Temp 97.9 F (36.6 C) (Oral)   Resp 10   Ht '5\' 10"'  (1.778 m)   Wt 57.9 kg (127 lb 10.3 oz)   SpO2 100%   BMI 18.32 kg/m  Pain Assessment: No/denies pain POSS *See Group Information*: 2-Acceptable,Slightly drowsy, easily aroused Pain Score: 0-No pain   SpO2: SpO2: 100 % O2 Device:SpO2: 100 % O2 Flow Rate: .O2 Flow Rate (L/min): 3 L/min  IO: Intake/output summary:  Intake/Output Summary (Last 24 hours) at 06/13/17 1226 Last data filed at 06/13/17 0400  Gross per 24 hour  Intake          2756.84 ml  Output               75 ml  Net          2681.84 ml    LBM: Last BM Date: 06/12/17 Baseline Weight: Weight: 53.9 kg (118 lb 13.3 oz) Most recent weight: Weight: 57.9 kg (127 lb 10.3 oz)     Palliative Assessment/Data:     Time In:  11 Time Out:  12.10 Time Total:  70 min  Greater than 50%  of this time was spent counseling and coordinating care related to the above assessment and plan.  Signed by: Loistine Chance, MD  [phone]  Please contact Palliative Medicine Team phone at [phone] for questions and concerns.  For individual provider: See Shea Evans

## 2017-06-13 NOTE — Progress Notes (Signed)
[facility] Progress Note  CC:  GI bleed  Subjective:  Feels ok.  Per nursing staff, had an episode of moderate amount of rectal bleeding last night, but none since that time.  Objective:  Vital signs in last 24 hours: Temp:  [97.7 F (36.5 C)-99.1 F (37.3 C)] 97.9 F (36.6 C) (09/19 0801) Pulse Rate:  [57-250] 57 (09/19 0800) Resp:  [11-22] 12 (09/19 0800) BP: (90-131)/(61-102) 100/67 (09/19 0800) SpO2:  [82 %-100 %] 98 % (09/19 0800) Weight:  [118 lb 13.3 oz (53.9 kg)-127 lb 10.3 oz (57.9 kg)] 127 lb 10.3 oz (57.9 kg) (09/19 0519) Last BM Date: 06/12/17 General:  Alert, cachectic, in NAD Heart:  Slightly bradycardic; no murmurs Pulm:  CTAB.  No increased WOB. Abdomen:  Soft, non-distended.  BS present.  Non-tender.   Extremities:  Without edema. Neurologic:  Alert and oriented x 4;  grossly normal neurologically. Psych:  Alert and cooperative. Normal mood and affect.  Intake/Output from previous day: 09/18 0701 - 09/19 0700 In: 2756.8 [I.V.:2370.8; Blood:386] Out: 75 [Urine:75]  Lab Results:  Recent Labs  06/12/17 1322 06/13/17 0316 06/13/17 0823  WBC 6.8 6.3 5.5  HGB 6.1* 8.4* 8.2*  HCT 17.5* 24.2* 24.4*  PLT 125* 108* 113*   BMET  Recent Labs  06/12/17 0326 06/13/17 0316  NA 136 142  K 4.2 4.4  CL 105 114*  CO2 22 22  GLUCOSE 145* 83  BUN 50* 28*  CREATININE 1.73* 1.03  CALCIUM 9.1 8.4*   PT/INR  Recent Labs  06/12/17 0533  LABPROT 13.4  INR 1.03    Dg Chest 2 View  Result Date: 06/12/2017 CLINICAL DATA:  Chest pain and shortness of breath.  Fall. EXAM: CHEST  2 VIEW COMPARISON:  05/29/2017 FINDINGS: Normal heart size and pulmonary vascularity. No focal airspace disease or consolidation in the lungs. No blunting of costophrenic angles. No pneumothorax. Mediastinal contours appear intact. IMPRESSION: No active cardiopulmonary disease. Electronically Signed   By: Lucienne Capers M.D.   On: 06/12/2017 03:31   Ct Head Wo  Contrast  Result Date: 06/12/2017 CLINICAL DATA:  Fall with head trauma EXAM: CT HEAD WITHOUT CONTRAST CT CERVICAL SPINE WITHOUT CONTRAST TECHNIQUE: Multidetector CT imaging of the head and cervical spine was performed following the standard protocol without intravenous contrast. Multiplanar CT image reconstructions of the cervical spine were also generated. COMPARISON:  Brain MRI 12/02/2016 FINDINGS: CT HEAD FINDINGS Brain: No mass lesion, intraparenchymal hemorrhage or extra-axial collection. No evidence of acute cortical infarct. There is periventricular hypoattenuation compatible with chronic microvascular disease. Volume loss is greater than expected for age. Vascular: No hyperdense vessel or unexpected calcification. Skull: Normal visualized skull base, calvarium and extracranial soft tissues. Sinuses/Orbits: No sinus fluid levels or advanced mucosal thickening. No mastoid effusion. Normal orbits. CT CERVICAL SPINE FINDINGS Alignment: No static subluxation. Facets are aligned. Occipital condyles are normally positioned. Skull base and vertebrae: No acute fracture. Soft tissues and spinal canal: No prevertebral fluid or swelling. No visible canal hematoma. Disc levels: Degenerative disease is greatest at C4-C5. No bony spinal canal stenosis. Upper chest: Biapical emphysema. Other: Normal visualized paraspinal cervical soft tissues. IMPRESSION: 1. Age advanced atrophy the and findings of chronic microvascular ischemia. 2. No fracture or static subluxation of the cervical spine. 3. Biapical Emphysema (ICD10-J43.9). Electronically Signed   By: Ulyses Jarred M.D.   On: 06/12/2017 03:34   Ct Cervical Spine Wo Contrast  Result Date: 06/12/2017 CLINICAL DATA:  Fall with head trauma  EXAM: CT HEAD WITHOUT CONTRAST CT CERVICAL SPINE WITHOUT CONTRAST TECHNIQUE: Multidetector CT imaging of the head and cervical spine was performed following the standard protocol without intravenous contrast. Multiplanar CT image  reconstructions of the cervical spine were also generated. COMPARISON:  Brain MRI 12/02/2016 FINDINGS: CT HEAD FINDINGS Brain: No mass lesion, intraparenchymal hemorrhage or extra-axial collection. No evidence of acute cortical infarct. There is periventricular hypoattenuation compatible with chronic microvascular disease. Volume loss is greater than expected for age. Vascular: No hyperdense vessel or unexpected calcification. Skull: Normal visualized skull base, calvarium and extracranial soft tissues. Sinuses/Orbits: No sinus fluid levels or advanced mucosal thickening. No mastoid effusion. Normal orbits. CT CERVICAL SPINE FINDINGS Alignment: No static subluxation. Facets are aligned. Occipital condyles are normally positioned. Skull base and vertebrae: No acute fracture. Soft tissues and spinal canal: No prevertebral fluid or swelling. No visible canal hematoma. Disc levels: Degenerative disease is greatest at C4-C5. No bony spinal canal stenosis. Upper chest: Biapical emphysema. Other: Normal visualized paraspinal cervical soft tissues. IMPRESSION: 1. Age advanced atrophy the and findings of chronic microvascular ischemia. 2. No fracture or static subluxation of the cervical spine. 3. Biapical Emphysema (ICD10-J43.9). Electronically Signed   By: Ulyses Jarred M.D.   On: 06/12/2017 03:34   Nm Pulmonary Perf And Vent  Result Date: 06/12/2017 CLINICAL DATA:  Shortness of breath, chest pain. EXAM: NUCLEAR MEDICINE VENTILATION - PERFUSION LUNG SCAN TECHNIQUE: Ventilation images were obtained in multiple projections using inhaled aerosol Tc-29m DTPA. Perfusion images were obtained in multiple projections after intravenous injection of Tc-56m MAA. RADIOPHARMACEUTICALS:  28.9 mCi Technetium-60m DTPA aerosol inhalation and 4.8 mCi Technetium-41m MAA IV COMPARISON:  Chest x-ray 06/12/2017 FINDINGS: Ventilation: Patchy diffuse nonsegmental ventilation defects. Perfusion: Matching patchy diffuse nonsegmental perfusion  defects. IMPRESSION: Diffuse nonsegmental ventilation and perfusion defects throughout the lungs, most compatible with obstructive lung disease. Low probability for pulmonary embolus. Electronically Signed   By: Rolm Baptise M.D.   On: 06/12/2017 12:10    Assessment / Plan: 1. 56 yo male with three day history of blood in stool on Eliquis. He is poor historian. Admits to associated rectal discomfort, abdominal discomfort, nausea / vomiting and recent constipation. Gives very little details but is clear that he has no history of GI bleeding. Hgb down several grams from baseline on admission. This could be diverticular bleeding. 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  This was found in history from one of Infectious Diseases office notes. Nothing in Care Everywhere about this. Upper GI bleeding not excluded, especially given daily NSAID use. -Given AKI Eliquis will need more time to clear his system.  -agree with BID PPI -Will need endoscopic evaluation in a couple of days when Eliquis washes out.  Will discuss with Dr. Henrene Pastor regarding timing. -supportive care for now  2. Anemia of acute blood loss:  Hgb increased appropriately by 2 grams with 2 units PRBC's and is stable today.  3. AKI:  Improved.  4. HIV, abs CD4 count of 50  5. Hx of DVT / PE with right heart strain. He has an IVC. On Eliquis at home.   LOS: 1 day   ZEHR, JESSICA D.  06/13/2017, 9:24 AM  Pager number [phone]  GI ATTENDING  Interval history data reviewed. Patient seen and examined. Agree with above interval progress note as outlined. Patient has had additional bleeding most consistent with hemodynamically stable lower GI bleeding. Awaiting Eliquis washout. Ongoing supportive care. Plans  for colonoscopy with possible upper endoscopy Friday with monitored anesthesia care. Patient is high risk given his comorbidities.  Docia Chuck. Geri Seminole., M.D. [facility] Division  of Gastroenterology

## 2017-06-13 NOTE — Progress Notes (Signed)
PT Cancellation Note  Patient Details Name: [name] Herring MRN: [id number] DOB: [birth year]   Cancelled Treatment:    Reason Eval/Treat Not Completed: Medical issues which prohibited therapy;Fatigue/lethargy limiting ability to participate   Claretha Cooper 06/13/2017, 3:20 PM Tresa Endo PT [phone]

## 2017-06-13 NOTE — Progress Notes (Signed)
RN noticed ST elevation on monitor. EKG showed normal sinus rhythm with ST elevation probably due to repolarization. Patient asymptomatic. MD notified. Will continue to monitor patient.

## 2017-06-13 NOTE — Progress Notes (Signed)
  Echocardiogram 2D Echocardiogram has been performed.  Gedalia Mcmillon L Androw 06/13/2017, 1:40 PM

## 2017-06-13 NOTE — Progress Notes (Signed)
Report given to [facility] on 4th floor. Pt to transfer to rm [id number] via bed. Pt's sister at bedside and made aware of pt transferring to rm 1442 tonight.

## 2017-06-13 NOTE — Progress Notes (Signed)
OT Cancellation Note  Patient Details Name: [name] Herring MRN: [id number] DOB: 1960/12/11   Cancelled Treatment:    Reason Eval/Treat Not Completed: Other (comment).  Checked on pt at 14:05; he did not feel up to therapy today.  Tatsuya Okray 06/13/2017, 3:36 PM  Lesle Chris, OTR/L [phone] 06/13/2017

## 2017-06-14 DIAGNOSIS — R634 Abnormal weight loss: Secondary | ICD-10-CM

## 2017-06-14 DIAGNOSIS — K922 Gastrointestinal hemorrhage, unspecified: Secondary | ICD-10-CM

## 2017-06-14 DIAGNOSIS — N179 Acute kidney failure, unspecified: Secondary | ICD-10-CM

## 2017-06-14 DIAGNOSIS — B20 Human immunodeficiency virus [HIV] disease: Secondary | ICD-10-CM

## 2017-06-14 LAB — BASIC METABOLIC PANEL
Anion gap: 5 (ref 5–15)
BUN: 17 mg/dL (ref 6–20)
CALCIUM: 8.4 mg/dL — AB (ref 8.9–10.3)
CHLORIDE: 111 mmol/L (ref 101–111)
CO2: 22 mmol/L (ref 22–32)
CREATININE: 1.01 mg/dL (ref 0.61–1.24)
Glucose, Bld: 87 mg/dL (ref 65–99)
Potassium: 4 mmol/L (ref 3.5–5.1)
SODIUM: 138 mmol/L (ref 135–145)

## 2017-06-14 LAB — CBC
HCT: 23 % — ABNORMAL LOW (ref 39.0–52.0)
HCT: 22.5 % — ABNORMAL LOW (ref 39.0–52.0)
Hemoglobin: 7.7 g/dL — ABNORMAL LOW (ref 13.0–17.0)
Hemoglobin: 7.6 g/dL — ABNORMAL LOW (ref 13.0–17.0)
MCH: 30.9 pg (ref 26.0–34.0)
MCH: 30.3 pg (ref 26.0–34.0)
MCHC: 33.5 g/dL (ref 30.0–36.0)
MCHC: 33.8 g/dL (ref 30.0–36.0)
MCV: 92.4 fL (ref 78.0–100.0)
MCV: 89.6 fL (ref 78.0–100.0)
PLATELETS: 112 10*3/uL — AB (ref 150–400)
PLATELETS: 100 10*3/uL — AB (ref 150–400)
RBC: 2.49 MIL/uL — ABNORMAL LOW (ref 4.22–5.81)
RBC: 2.51 MIL/uL — AB (ref 4.22–5.81)
RDW: 20.5 % — ABNORMAL HIGH (ref 11.5–15.5)
RDW: 19.9 % — ABNORMAL HIGH (ref 11.5–15.5)
WBC: 4.2 10*3/uL (ref 4.0–10.5)
WBC: 4 10*3/uL (ref 4.0–10.5)

## 2017-06-14 LAB — GLUCOSE, CAPILLARY: GLUCOSE-CAPILLARY: 120 mg/dL — AB (ref 65–99)

## 2017-06-14 MED ORDER — SODIUM CHLORIDE 0.9 % IV SOLN
INTRAVENOUS | Status: DC
  Administered 2017-06-15: 09:00:00 via INTRAVENOUS

## 2017-06-14 MED ORDER — HALOPERIDOL 5 MG PO TABS
5.0000 mg | ORAL_TABLET | Freq: Two times a day (BID) | ORAL | Status: DC | PRN
Start: 2017-06-14 — End: 2017-06-18
  Filled 2017-06-14: qty 1

## 2017-06-14 NOTE — Consult Note (Signed)
   [facility] Inpatient Consult   06/14/2017  DAVELL BECKSTEAD 1960-10-09 [id number]   Screened for potential [facility] Care Management services.   Chart reviewed. Unable to identify any [facility] Care Management needs at this time.   Spoke with patient's nurse. Palliative medicine team following. To have colonoscopy tomorrow.   Disposition plans not known at this time.    Marthenia Rolling, MSN-Ed, RN,BSN [facility] Liaison [phone]

## 2017-06-14 NOTE — Progress Notes (Signed)
PT Cancellation Note  Patient Details Name: KIRK BASQUEZ MRN: [id number] DOB: [birth year]   Cancelled Treatment:    Reason Eval/Treat Not Completed: Medical issues which prohibited therapy, planned further testing tomorrow. Will check back after procedures and stable.   Marcelino Freestone PT [phone]  06/14/2017, 1:57 PM

## 2017-06-14 NOTE — Consult Note (Signed)
[facility] for Infectious Disease    Date of Admission:  06/12/2017          Reason for Consult: HIV infection    Referring Provider: Dr. Scarlette Shorts  Assessment: His recent HIV viral loads indicate that he probably has been consistently adherent with his Genvoya. Although his CD4 count plummeted early this month when he was admitted and treated for possible pneumonia it is not at all unusual for her CD4 counts to drop transiently during any acute illness. I seriously doubt that his recent weight loss and failure to thrive is due to HIV and his long as he consistently takes [place] he is unlikely to have any significant complications from his infection. I agree with proceeding with EGD and colonoscopy.   Plan: 1. Continue Genvoya 2. Repeat CD4 and viral load   Principal Problem:   GIB (gastrointestinal bleeding) Active Problems:   Human immunodeficiency virus (HIV) disease ([place])   Unintentional weight loss   Depression   Alcohol use disorder, severe, dependence ([place])   Paranoid schizophrenia, chronic condition ([place])   HTN (hypertension)   Tobacco abuse   PE (pulmonary thromboembolism) ([place])   DVT of lower extremity, bilateral (HCC)   AKI (acute kidney injury) ([place])   Protein-calorie malnutrition, severe (HCC)   Chest pain   Nausea vomiting and diarrhea   Encounter for palliative care   Goals of care, counseling/discussion   Hematochezia   Acute blood loss anemia   Coagulopathy (HCC)   Abnormal CT of the abdomen   . benztropine  0.5 mg Oral Daily   And  . benztropine  1 mg Oral QHS  . dextromethorphan-guaiFENesin  1 tablet Oral BID  . elvitegravir-cobicistat-emtricitabine-tenofovir  1 tablet Oral Q breakfast  . fluconazole  100 mg Oral Weekly  . folic acid  1 mg Oral Daily  . gabapentin  600 mg Oral QHS  . LORazepam  0-4 mg Intravenous Q12H  . magnesium oxide  400 mg Oral BID  . metoprolol tartrate  25 mg Oral BID  . mirtazapine  15 mg Oral QHS  .  multivitamin with minerals  1 tablet Oral Daily  . nicotine  21 mg Transdermal Daily  . [START ON 06/15/2017] pantoprazole  40 mg Intravenous Q12H  . peg 3350 powder  0.5 kit Oral Once   And  . [START ON 06/15/2017] peg 3350 powder  0.5 kit Oral Once  . sulfamethoxazole-trimethoprim  1 tablet Oral Daily  . thiamine  100 mg Oral Daily  . tiotropium  18 mcg Inhalation Daily  . Valbenazine Tosylate  80 mg Oral BID    HPI: [name] Herring is a 56 y.o. male with long-standing HIV infection who is followed by my partner, Dr. Lita Mains. He has been on Genvoya. When last seen in our clinic in February he was doing well with a CD4 count of 290 and a relatively low viral load of 152. He had just recently been hospitalized with a left leg DVT and massive pulmonary embolus. He was started on Eliquis. He was admitted to the hospital again on 05/30/2017 with nausea, vomiting and shortness of breath. He felt like he had pneumonia and was treated for presumed pneumonia although in retrospect I do not see much on his chest x-ray and he was not febrile. His CD4 count at that time was down to 50 but his viral load was down even lower to 30. He was readmitted again 2 days  ago with nausea, vomiting or bleeding.  His sister says that she has seen him deteriorating ever since he was diagnosed with the DVT and pulmonary embolus. She and her husband check in on him twice daily and make sure that he has at least 2 good meals daily. He says that his appetite has declined and he has been losing weight because he does not eat as much. He denies any cocaine or other illicit drug use. He does drink alcohol but denies drinking to excess recently. He denies feeling depressed. He says that he always takes his Genvoya and trimethoprim sulfamethoxazole. He does not take his psychiatric meds because he does not like how they make him feel.   Review of Systems: Review of Systems  Constitutional: Positive for malaise/fatigue and  weight loss. Negative for chills, diaphoresis and fever.  HENT: Negative for sore throat.   Respiratory: Negative for cough, sputum production and shortness of breath.   Cardiovascular: Negative for chest pain.  Gastrointestinal: Positive for blood in stool, nausea and vomiting. Negative for abdominal pain, diarrhea and heartburn.  Genitourinary: Negative for dysuria and frequency.  Musculoskeletal: Negative for joint pain and myalgias.  Skin: Negative for rash.  Neurological: Positive for weakness. Negative for dizziness and headaches.  Psychiatric/Behavioral: Negative for depression and substance abuse. The patient is not nervous/anxious.     Past Medical History:  Diagnosis Date  . Depression   . HIV (human immunodeficiency virus infection) ([place])   . Hypertension   . Immune deficiency disorder ([place])   . PE (pulmonary thromboembolism) ([place]) 2018  . Personality disorder   . Schizophrenia [facility])     Social History  Substance Use Topics  . Smoking status: Current Every Day Smoker    Packs/day: 1.00    Years: 33.00    Types: Cigarettes  . Smokeless tobacco: Never Used  . Alcohol use 0.0 oz/week     Comment: Drank a fifth this AM, drinks 2x/month    Family History  Problem Relation Age of Onset  . CAD Mother   . Kidney disease Sister   . Lupus Brother   . Cancer Maternal Grandmother   . Stroke Paternal Grandmother    Allergies  Allergen Reactions  . Amoxicillin Shortness Of Breath and Rash    Has patient had a PCN reaction causing immediate rash, facial/tongue/throat swelling, [name] Has patient had a PCN reaction causing severe rash involving mucus membranes or skin necrosis: no Has patient had a PCN reaction that required hospitalization: yes drs office visit Has patient had a PCN reaction occurring within the last 10 years: no If all of the above answers are "NO", then may proceed with Cephalosporin use.   . Latex Shortness Of  Breath    OBJECTIVE: Blood pressure 91/69, pulse 62, temperature 98.1 F (36.7 C), temperature source Oral, resp. rate 12, height _0  (1.778 m), weight 127 lb 10.3 oz (57.9 kg), SpO2 100 %.  Physical Exam  Constitutional: He is oriented to person, place, and time.  He is very thin and weak and slow to answer questions. When he does he answers appropriately. His sister and brother-in-law are at the bedside.  HENT:  Mouth/Throat: No oropharyngeal exudate.  Eyes: Conjunctivae are normal.  Neck: Neck supple.  Cardiovascular: Normal rate and regular rhythm.   No murmur heard. Pulmonary/Chest: Effort normal and breath sounds normal.  Abdominal: Soft. He exhibits no distension and no mass. There is no tenderness.  Musculoskeletal: Normal range of  motion.  Neurological: He is alert and oriented to person, place, and time.  Skin: No rash noted.  Psychiatric: Mood and affect normal.    Lab Results Lab Results  Component Value Date   WBC 4.0 06/14/2017   HGB 7.6 (L) 06/14/2017   HCT 22.5 (L) 06/14/2017   MCV 89.6 06/14/2017   PLT 100 (L) 06/14/2017    Lab Results  Component Value Date   CREATININE 1.01 06/14/2017   BUN 17 06/14/2017   NA 138 06/14/2017   K 4.0 06/14/2017   CL 111 06/14/2017   CO2 22 06/14/2017    Lab Results  Component Value Date   ALT 18 05/29/2017   AST 39 05/29/2017   ALKPHOS 55 05/29/2017   BILITOT 0.9 05/29/2017     Microbiology: Recent Results (from the past 240 hour(s))  MRSA PCR Screening     Status: None   Collection Time: 06/12/17  2:35 PM  Result Value Ref Range Status   MRSA by PCR NEGATIVE NEGATIVE Final    Comment:        The GeneXpert MRSA Assay (FDA approved for NASAL specimens only), is one component of a comprehensive MRSA colonization surveillance program. It is not intended to diagnose MRSA infection nor to guide or monitor treatment for MRSA infections.     Michel Bickers, MD [facility] [phone] pager   [phone] cell 06/14/2017, 4:23 PM

## 2017-06-14 NOTE — Progress Notes (Signed)
[facility] Progress Note  Subjective:  Nurse reports more dark, bloody stool overnight.  Hgb down slightly.  Objective:  Vital signs in last 24 hours: Temp:  [97.3 F (36.3 C)-97.8 F (36.6 C)] 97.7 F (36.5 C) (09/20 0545) Pulse Rate:  [51-63] 63 (09/20 0545) Resp:  [10-15] 11 (09/20 0545) BP: (90-109)/(59-74) 104/63 (09/20 0545) SpO2:  [98 %-100 %] 100 % (09/20 0545) Last BM Date: 06/13/17 General:  Alert, cachectic with temporal wasting, in NAD; sleepy Heart:  Regular rate and rhythm; no murmurs Pulm:  CTAB.  No increased WOB. Abdomen:  Soft, non-distended.  BS present.  Non-tender. Extremities:  Without edema.  Intake/Output from previous day: 09/19 0701 - 09/20 0700 In: 1478.3 [P.O.:120; I.V.:1358.3] Out: 725 [Urine:725]  Lab Results:  Recent Labs  06/13/17 0823 06/13/17 1922 06/14/17 0509  WBC 5.5 5.6 4.2  HGB 8.2* 7.8* 7.7*  HCT 24.4* 22.9* 23.0*  PLT 113* 104* 112*   BMET  Recent Labs  06/12/17 0326 06/13/17 0316 06/14/17 0518  NA 136 142 138  K 4.2 4.4 4.0  CL 105 114* 111  CO2 22 22 22   GLUCOSE 145* 83 87  BUN 50* 28* 17  CREATININE 1.73* 1.03 1.01  CALCIUM 9.1 8.4* 8.4*   PT/INR  Recent Labs  06/12/17 0533  LABPROT 13.4  INR 1.03   Nm Pulmonary Perf And Vent  Result Date: 06/12/2017 CLINICAL DATA:  Shortness of breath, chest pain. EXAM: NUCLEAR MEDICINE VENTILATION - PERFUSION LUNG SCAN TECHNIQUE: Ventilation images were obtained in multiple projections using inhaled aerosol Tc-72m DTPA. Perfusion images were obtained in multiple projections after intravenous injection of Tc-1m MAA. RADIOPHARMACEUTICALS:  28.9 mCi Technetium-84m DTPA aerosol inhalation and 4.8 mCi Technetium-74m MAA IV COMPARISON:  Chest x-ray 06/12/2017 FINDINGS: Ventilation: Patchy diffuse nonsegmental ventilation defects. Perfusion: Matching patchy diffuse nonsegmental perfusion defects. IMPRESSION: Diffuse nonsegmental ventilation and perfusion  defects throughout the lungs, most compatible with obstructive lung disease. Low probability for pulmonary embolus. Electronically Signed   By: Rolm Baptise M.D.   On: 06/12/2017 12:10   Assessment / Plan: 1. 56 yo male with three day history of blood in stool on Eliquis. He is poor historian. Admits to associated rectal discomfort, abdominal discomfort, nausea / vomiting and recent constipation. Gives very little details but is clear that he has no history of GI bleeding. Hgb down several grams from baseline on admission. This could be diverticular bleeding. 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  This was found in history from one of Infectious Diseases office notes. Nothing in Care Everywhere about this.Upper GI bleeding not excluded, especially given daily NSAID use. -agree with BID PPI -continue supportive care -colonoscopy and possible EGD tomorrow, 9/21  2. Anemia of acute blood loss:  Hgb increased appropriately by 2 grams with 2 units PRBC's.  It is down slightly again today. -continue to monitor Hgb and transfuse prn  3. AKI:  Improved.  4. HIV, abs CD4 count of 50  5. Hx of DVT / PE with right heart strain. He has an IVC. On Eliquis at home--this has been on hold.   LOS: 2 days   ZEHR, JESSICA D.  06/14/2017, 9:24 AM  Pager number [phone]  GI ATTENDING  Interval history data reviewed. Patient seen and examined. Tentative plans for endoscopic evaluations to evaluate GI bleeding tomorrow. He is high-risk. I'm highly concerned about his overall prognosis, particularly given his current CD4 count.  With his multiple physical and social issues, I do not have a good feel for his long-term care plan. I would ask infectious diseases to weigh in regarding current and future treatment plans as well as expected prognosis.  Docia Chuck. Geri Seminole., M.D. [facility] Division of Gastroenterology

## 2017-06-14 NOTE — Progress Notes (Signed)
PROGRESS NOTE    [name] BLOYD  BPZ:[id number] DOB: [birth year] DOA: 06/12/2017 PCP: Arnoldo Morale, MD  Brief Narrative:[name] Herring a 56 y.o.malewith medical history significant of hypertension, depression, polysubstance abuse (tobacco, alcohol, cocaine), DVT, PE on eliquis. , HIV/AIDs (CD4 to 50 and viral load 1.477 on 05/30/17), schizophrenia, immunodeficiency disorder, VERY POOR HISTORIAN who presented with chest pain, shortness breath, nausea, vomiting, diarrhea, abdominal pain, rectal bleeding and anemia.   Assessment & Plan:   GI Bleed/Anemia/heme pos stools -Status post 2 units of PRBC since admission -History of NSAID and alcohol abuse -GI consulting, plan for EGD/Colonoscopy tomorrow, once eliquis washed out -Hb slowly trending down with continued bleeding, monitor, will likley need transfusion later today or tomorrow  Human immunodeficiency virus (HIV) disease ([place]): CD4 =50 and viral load 1.477 on 05/30/17, I question his compliance -continue home HIV meds -pt is on Bactrim and fluconazole forprophylaxis  Hx of PE and DVT: pt is on Eliquis -Patient reports compliance but this is likely questionable -VQ scan with low probability of PE and history of IVC filte -currently holding Eliquis in setting of GI bleeding  Chest pain -resolved -troponin negative x3 -EKG abnormal with ST changes, ECHO with EF of 50-55% and normal wall motion  AKI: pt's Cre is 1.73 and BUN 50 on admission. Likely due to prerenal secondary to dehydration and continuation of ACEI, NSAIDs and Bactrim. -cut down IVF, creatinine improved  Protein-calorie malnutrition, severe ([place]): -Nutrition consult  Nausea vomiting and diarrhea: -? alcoholicgastritis. Viral gastroenteritis is also possible. Patient has a mild leukocytosis, but no fever.  -Clinically does not seem to have C. difficile colitis. -supportive care -improving, continue clears  HTN: -Continue metoprolol -Holding  lisinopril  Schizophrenia and depression: -Continue haldol, Remeron,Valbenzine, Cogentin -will cut down doses of COgentin and haldol due to increased somolence, change to PRN Haldol  Tobacco abuse and Alcohol abuse: -Nicotine patch -CIWA protocol  Prophylaxis: SCD Code Status: DNR  Family Communication: sister at bedside notes ongoing decline and FTT Disposition Plan: remain inpatient.   Consultants:   [facility] GI  Subjective: Weak, very poor Po, ongoing intermittent bleeding  Objective: Vitals:   06/13/17 2228 06/14/17 0545 06/14/17 1025 06/14/17 1411  BP: 109/74 1_0  Pulse: (!) 52 63 63 72  Resp: _1 Temp:  97.7 F (36.5 C) 98 F (36.7 C) 98.1 F (36.7 C)  TempSrc:  Oral Oral Oral  SpO2: 100% 100% 100% 100%  Weight:      Height:        Intake/Output Summary (Last 24 hours) at 06/14/17 1447 Last data filed at 06/14/17 1412  Gross per 24 hour  Intake          1958.25 ml  Output             1225 ml  Net           733.25 ml   Filed Weights   06/12/17 1326 06/13/17 0519  Weight: 53.9 kg (118 lb 13.3 oz) 57.9 kg (127 lb 10.3 oz)    Examination:  Gen: cachectic chronically ill male, laying in bed, somnolent HEENT: PERRLA, Neck supple, no JVD Lungs: decreased BS at bases CVS: RRR,No Gallops,Rubs or new Murmurs Abd: soft, Non tender, non distended, BS present Extremities: No Cyanosis, Clubbing or edema Skin: no new rashes    Data Reviewed:   CBC:  Recent Labs Lab 06/12/17 0326  06/12/17 1322 06/13/17 0316 06/13/17 0823 06/13/17 1922 06/14/17 0509  WBC  11.6*  < > 6.8 6.3 5.5 5.6 4.2  NEUTROABS 9.7*  --   --   --   --   --   --   HGB 8.1*  < > 6.1* 8.4* 8.2* 7.8* 7.7*  HCT 23.6*  < > 17.5* 24.2* 24.4* 22.9* 23.0*  MCV 89.1  < > 88.4 90.0 89.7 91.6 92.4  PLT 159  < > 125* 108* 113* 104* 112*  < > = values in this interval not displayed. Basic Metabolic Panel:  Recent Labs Lab 06/12/17 0326 06/13/17 0316 06/14/17 0518   NA 136 142 138  K 4.2 4.4 4.0  CL 105 114* 111  CO2 _0 GLUCOSE 145* 83 87  BUN 50* 28* 17  CREATININE 1.73* 1.03 1.01  CALCIUM 9.1 8.4* 8.4*   GFR: Estimated Creatinine Clearance: 67.7 mL/min (by C-G formula based on SCr of 1.01 mg/dL). Liver Function Tests: No results for input(s): AST, ALT, ALKPHOS, BILITOT, PROT, ALBUMIN in the last 168 hours. No results for input(s): LIPASE, AMYLASE in the last 168 hours. No results for input(s): AMMONIA in the last 168 hours. Coagulation Profile:  Recent Labs Lab 06/12/17 0533  INR 1.03   Cardiac Enzymes:  Recent Labs Lab 06/12/17 0326 06/12/17 0533 06/12/17 1322 06/13/17 0316  TROPONINI <0.03 <0.03 <0.03 <0.03   BNP (last 3 results) No results for input(s): PROBNP in the last 8760 hours. HbA1C: No results for input(s): HGBA1C in the last 72 hours. CBG:  Recent Labs Lab 06/12/17 0824 06/13/17 0737 06/14/17 0741  GLUCAP 117* 82 120*   Lipid Profile: No results for input(s): CHOL, HDL, LDLCALC, TRIG, CHOLHDL, LDLDIRECT in the last 72 hours. Thyroid Function Tests: No results for input(s): TSH, T4TOTAL, FREET4, T3FREE, THYROIDAB in the last 72 hours. Anemia Panel: No results for input(s): VITAMINB12, FOLATE, FERRITIN, TIBC, IRON, RETICCTPCT in the last 72 hours. Urine analysis:    Component Value Date/Time   COLORURINE YELLOW 05/29/2017 1832   APPEARANCEUR CLEAR 05/29/2017 1832   LABSPEC 1.006 05/29/2017 1832   PHURINE 6.0 05/29/2017 1832   GLUCOSEU NEGATIVE 05/29/2017 1832   GLUCOSEU NEG mg/dL 10/11/2006 2016   HGBUR NEGATIVE 05/29/2017 1832   BILIRUBINUR NEGATIVE 05/29/2017 1832   KETONESUR NEGATIVE 05/29/2017 1832   PROTEINUR NEGATIVE 05/29/2017 1832   UROBILINOGEN 1 10/11/2006 2016   NITRITE NEGATIVE 05/29/2017 1832   LEUKOCYTESUR NEGATIVE 05/29/2017 1832   Sepsis Labs: _1 (procalcitonin:4,lacticidven:4)  ) Recent Results (from the past 240 hour(s))  MRSA PCR Screening     Status: None    Collection Time: 06/12/17  2:35 PM  Result Value Ref Range Status   MRSA by PCR NEGATIVE NEGATIVE Final    Comment:        The GeneXpert MRSA Assay (FDA approved for NASAL specimens only), is one component of a comprehensive MRSA colonization surveillance program. It is not intended to diagnose MRSA infection nor to guide or monitor treatment for MRSA infections.          Radiology Studies: No results found.      Scheduled Meds: . benztropine  0.5 mg Oral Daily   And  . benztropine  1 mg Oral QHS  . dextromethorphan-guaiFENesin  1 tablet Oral BID  . elvitegravir-cobicistat-emtricitabine-tenofovir  1 tablet Oral Q breakfast  . fluconazole  100 mg Oral Weekly  . folic acid  1 mg Oral Daily  . gabapentin  600 mg Oral QHS  . LORazepam  0-4 mg Intravenous Q12H  . magnesium oxide  400  mg Oral BID  . metoprolol tartrate  25 mg Oral BID  . mirtazapine  15 mg Oral QHS  . multivitamin with minerals  1 tablet Oral Daily  . nicotine  21 mg Transdermal Daily  . [START ON 06/15/2017] pantoprazole  40 mg Intravenous Q12H  . peg 3350 powder  0.5 kit Oral Once   And  . [START ON 06/15/2017] peg 3350 powder  0.5 kit Oral Once  . sulfamethoxazole-trimethoprim  1 tablet Oral Daily  . thiamine  100 mg Oral Daily  . tiotropium  18 mcg Inhalation Daily  . Valbenazine Tosylate  80 mg Oral BID   Continuous Infusions: . [START ON 06/15/2017] sodium chloride    . pantoprozole (PROTONIX) infusion 8 mg/hr (06/14/17 1233)     LOS: 2 days    Time spent: 78mn    PDomenic Polite MD [facility] Pager [phone] If 7PM-7AM, please contact night-coverage [email or web address] Password [email or web address]9/20/2018, 2:47 PM

## 2017-06-14 NOTE — Progress Notes (Signed)
OT Cancellation Note  Patient Details Name: [name] Herring MRN: [id number] DOB: 1961-03-16   Cancelled Treatment:    Reason Eval/Treat Not Completed: Other (comment). Chaplain is with pt & note colonoscopy is planned for tomorrow. Will return as able  Kaena Santori 06/14/2017, 1:41 PM  Lesle Chris, OTR/L [phone] 06/14/2017

## 2017-06-14 NOTE — Progress Notes (Signed)
Daily Progress Note   Patient Name: [name] Herring       Date: 06/14/2017 DOB: 01/27/1961  Age: 56 y.o. MRN#: [id number] Attending Physician: Domenic Polite, MD Primary Care Physician: Arnoldo Morale, MD Admit Date: 06/12/2017  Reason for Consultation/Follow-up: Establishing goals of care  Subjective:  remains weak, does not verbalize much  Sister at bedside, see discussions below:   Length of Stay: 2  Current Medications: Scheduled Meds:  . benztropine  0.5 mg Oral Daily   And  . benztropine  1 mg Oral QHS  . dextromethorphan-guaiFENesin  1 tablet Oral BID  . elvitegravir-cobicistat-emtricitabine-tenofovir  1 tablet Oral Q breakfast  . fluconazole  100 mg Oral Weekly  . folic acid  1 mg Oral Daily  . gabapentin  600 mg Oral QHS  . LORazepam  0-4 mg Intravenous Q12H  . magnesium oxide  400 mg Oral BID  . metoprolol tartrate  25 mg Oral BID  . mirtazapine  15 mg Oral QHS  . multivitamin with minerals  1 tablet Oral Daily  . nicotine  21 mg Transdermal Daily  . [START ON 06/15/2017] pantoprazole  40 mg Intravenous Q12H  . peg 3350 powder  0.5 kit Oral Once   And  . [START ON 06/15/2017] peg 3350 powder  0.5 kit Oral Once  . sulfamethoxazole-trimethoprim  1 tablet Oral Daily  . thiamine  100 mg Oral Daily  . tiotropium  18 mcg Inhalation Daily  . Valbenazine Tosylate  80 mg Oral BID    Continuous Infusions: . [START ON 06/15/2017] sodium chloride    . pantoprozole (PROTONIX) infusion 8 mg/hr (06/14/17 1233)    PRN Meds: acetaminophen, albuterol, haloperidol, hydrALAZINE, LORazepam **OR** LORazepam, nitroGLYCERIN, ondansetron **OR** ondansetron (ZOFRAN) IV, oxyCODONE  Physical Exam          chronically ill appearing, older than stated age appearing gentleman Decreased  breath sounds S1 S2 Abdomen soft No edema Muscle wasting Opens eyes, interacts some with sister  Vital Signs: BP 98/65 (BP Location: Right Arm)   Pulse 63   Temp 98 F (36.7 C) (Oral)   Resp 11   Ht 5' 10" (1.778 m)   Wt 57.9 kg (127 lb 10.3 oz)   SpO2 100%   BMI 18.32 kg/m  SpO2: SpO2: 100 % O2 Device: O2 Device: Nasal Cannula  O2 Flow Rate: O2 Flow Rate (L/min): 2 L/min  Intake/output summary:  Intake/Output Summary (Last 24 hours) at 06/14/17 1354 Last data filed at 06/14/17 0214  Gross per 24 hour  Intake          1478.25 ml  Output              725 ml  Net           753.25 ml   LBM: Last BM Date: 06/14/17 Baseline Weight: Weight: 53.9 kg (118 lb 13.3 oz) Most recent weight: Weight: 57.9 kg (127 lb 10.3 oz)       Palliative Assessment/Data:      Patient Active Problem List   Diagnosis Date Noted  . Encounter for palliative care   . Goals of care, counseling/discussion   . Hematochezia   . Acute blood loss anemia   . Coagulopathy ([place])   . Abnormal CT of the abdomen   . GIB (gastrointestinal bleeding) 06/12/2017  . Fall 06/12/2017  . AKI (acute kidney injury) ([place]) 06/12/2017  . Protein-calorie malnutrition, severe ([place]) 06/12/2017  . Chest pain 06/12/2017  . Nausea vomiting and diarrhea 06/12/2017  . GI bleed 06/12/2017  . Pneumonia 05/29/2017  . Emphysema lung ([place]) 11/20/2016  . DVT of lower extremity, bilateral ([place]) 11/04/2016  . Tobacco abuse 11/02/2016  . PE (pulmonary thromboembolism) ([place]) 11/02/2016  . Elevated lactic acid level 11/02/2016  . Acute respiratory failure with hypoxia ([place]) 07/17/2016  . CAP (community acquired pneumonia) 07/16/2016  . Cocaine dependence with cocaine-induced mood disorder ([place]) 03/29/2016  . HTN (hypertension) 08/25/2015  . Paranoid schizophrenia, chronic condition ([place]) 12/30/2014  . Cocaine use disorder, severe, dependence ([place]) 12/27/2014  . Alcohol use disorder, severe, dependence ([place]) 12/27/2014  .  ERECTILE DYSFUNCTION 09/03/2007  . FOOT PAIN, BILATERAL 07/10/2007  . Human immunodeficiency virus (HIV) disease ([place]) 11/09/2006  . VIRAL MENINGITIS 11/09/2006  . THRUSH 11/09/2006  . CA IN SITU, RECTUM 11/09/2006  . Hypothyroidism 11/09/2006  . DISORDER, BIPOLAR NOS 11/09/2006  . Depression 11/09/2006  . PYELONEPHRITIS 11/09/2006    Palliative Care Assessment & Plan   Patient Profile:   56 year old gentleman with extensive past medical history significant for HIV infection, history of cocaine use polysubstance use, history of hospitalization in February 2018 for submassive pulmonary embolism and right heart strain after which he had IVC filter placed and was placed on oral anticoagulation with Eliquis, history of ongoing alcohol use.  It is noted that the patient was hospitalized earlier this month at [facility] for possible aspiration pneumonia.  Patient has been admitted to the hospitalist service with GI bleed, rectal bleeding, there is underlying concern for possible history of rectal carcinoma.  A palliative consultation has been requested for goals of care discussions.   Assessment:  GIB Acute blood loss anemia HIV+ AKI Severe protein calorie malnutrition H/O schizophrenia, depression ETOH, possibly poly substance use ?reported history of rectal cancer a few years ago.   Recommendations/Plan:   continue current scope of hospitalization  Is DNR DNI, based on his previously expressed wishes to his sister, Tarry Kos.   To undergo colonoscopy tomorrow.   May need SNF rehab with palliative versus even residential hospice, based on his clinical course and disease trajectory.   Code Status:    Code Status Orders        Start     Ordered   06/13/17 1226  Do not attempt resuscitation (DNR)  Continuous    Question Answer Comment  In the event of cardiac or respiratory ARREST Do not call a "code blue"   In the event of cardiac or respiratory ARREST  Do not perform Intubation, CPR, defibrillation or ACLS   In the event of cardiac or respiratory ARREST Use medication by any route, position, wound care, and other measures to relive pain and suffering. May use oxygen, suction and manual treatment of airway obstruction as needed for comfort.      06/13/17 1225    Code Status History    Date Active Date Inactive Code Status Order ID Comments User Context   06/12/2017  5:19 AM 06/13/2017 12:25 PM Full Code [id number]  Ivor Costa, MD ED   05/29/2017  9:55 PM 05/31/2017  3:41 PM Full Code [id number]  Karmen Bongo, MD ED   11/02/2016  8:41 PM 11/07/2016  5:01 PM Full Code [id number]  Ivor Costa, MD ED   07/16/2016  1:59 AM 07/17/2016  3:02 PM Full Code [id number]  Toy Baker, MD Inpatient   03/29/2016  2:56 PM 03/30/2016  8:29 PM Full Code [id number]  Patrecia Pour, NP Inpatient   01/26/2016 11:54 PM 01/27/2016  9:38 PM Full Code [id number]  Charlesetta Shanks, MD ED   12/26/2014  5:30 PM 12/26/2014  5:30 PM Full Code [id number]  Delfin Gant, NP Inpatient   12/26/2014  5:30 PM 12/30/2014  4:49 PM Full Code [id number]  Delfin Gant, NP Inpatient   12/26/2014 12:00 AM 12/26/2014  5:30 PM Full Code [id number]  Junius Creamer, NP ED   09/24/2014  9:59 PM 09/26/2014  5:51 PM Full Code [id number]  Mirna Mires, MD ED   07/01/2014  8:34 PM 07/08/2014  4:25 PM Full Code [id number]  Earleen Newport, NP Inpatient   06/30/2014  9:32 PM 07/01/2014  8:34 PM Full Code [id number]  Ephraim Hamburger, MD ED       Prognosis:   guarded   Discharge Planning:  To Be Determined  Care plan was discussed with  Patient, sister, Dr Broadus John.   Thank you for allowing the Palliative Medicine Team to assist in the care of this patient.   Time In: 1430 Time Out: 1500 Total Time 30 Prolonged Time Billed  no       Greater than 50%  of this time was spent counseling and coordinating care related to the above assessment and plan.  Loistine Chance, MD [phone]  Please contact  Palliative Medicine Team phone at [phone] for questions and concerns.

## 2017-06-15 ENCOUNTER — Encounter (HOSPITAL_COMMUNITY): Payer: Self-pay | Admitting: *Deleted

## 2017-06-15 ENCOUNTER — Encounter (HOSPITAL_COMMUNITY)
Admission: EM | Disposition: A | Discharge status: Home Health | Payer: Self-pay | Source: Home / Self Care | Attending: Internal Medicine

## 2017-06-15 DIAGNOSIS — K264 Chronic or unspecified duodenal ulcer with hemorrhage: Secondary | ICD-10-CM

## 2017-06-15 DIAGNOSIS — K625 Hemorrhage of anus and rectum: Secondary | ICD-10-CM

## 2017-06-15 DIAGNOSIS — D123 Benign neoplasm of transverse colon: Secondary | ICD-10-CM

## 2017-06-15 HISTORY — PX: COLONOSCOPY WITH PROPOFOL: SHX5780

## 2017-06-15 HISTORY — PX: ESOPHAGOGASTRODUODENOSCOPY (EGD) WITH PROPOFOL: SHX5813

## 2017-06-15 LAB — CBC
HEMATOCRIT: 22.3 % — AB (ref 39.0–52.0)
Hemoglobin: 7.5 g/dL — ABNORMAL LOW (ref 13.0–17.0)
MCH: 30.9 pg (ref 26.0–34.0)
MCHC: 33.6 g/dL (ref 30.0–36.0)
MCV: 91.8 fL (ref 78.0–100.0)
PLATELETS: 101 10*3/uL — AB (ref 150–400)
RBC: 2.43 MIL/uL — ABNORMAL LOW (ref 4.22–5.81)
RDW: 19.9 % — AB (ref 11.5–15.5)
WBC: 3.6 10*3/uL — AB (ref 4.0–10.5)

## 2017-06-15 LAB — HIV-1 RNA ULTRAQUANT REFLEX TO GENTYP+
HIV-1 RNA BY PCR: 40 copies/mL
HIV-1 RNA Quant, Log: 1.602 log10copy/mL

## 2017-06-15 LAB — PREPARE RBC (CROSSMATCH)

## 2017-06-15 LAB — T-HELPER CELLS (CD4) COUNT (NOT AT ARMC)
CD4 % Helper T Cell: 27 % — ABNORMAL LOW (ref 33–55)
CD4 T Cell Abs: 220 /uL — ABNORMAL LOW (ref 400–2700)

## 2017-06-15 LAB — GLUCOSE, CAPILLARY: GLUCOSE-CAPILLARY: 87 mg/dL (ref 65–99)

## 2017-06-15 SURGERY — COLONOSCOPY WITH PROPOFOL
Anesthesia: Moderate Sedation

## 2017-06-15 MED ORDER — SODIUM CHLORIDE 0.9 % IV BOLUS (SEPSIS)
500.0000 mL | Freq: Once | INTRAVENOUS | Status: AC
  Administered 2017-06-15: 500 mL via INTRAVENOUS

## 2017-06-15 MED ORDER — FENTANYL CITRATE (PF) 100 MCG/2ML IJ SOLN
INTRAMUSCULAR | Status: DC | PRN
  Administered 2017-06-15 (×3): 25 ug via INTRAVENOUS

## 2017-06-15 MED ORDER — FENTANYL CITRATE (PF) 100 MCG/2ML IJ SOLN
INTRAMUSCULAR | Status: AC
  Filled 2017-06-15: qty 2

## 2017-06-15 MED ORDER — SODIUM CHLORIDE 0.9 % IV SOLN
Freq: Once | INTRAVENOUS | Status: AC
  Administered 2017-06-15: 14:00:00 via INTRAVENOUS

## 2017-06-15 MED ORDER — MIDAZOLAM HCL 10 MG/2ML IJ SOLN
INTRAMUSCULAR | Status: DC | PRN
  Administered 2017-06-15 (×4): 2 mg via INTRAVENOUS

## 2017-06-15 MED ORDER — MIDAZOLAM HCL 5 MG/ML IJ SOLN
INTRAMUSCULAR | Status: AC
  Filled 2017-06-15: qty 2

## 2017-06-15 SURGICAL SUPPLY — 24 items

## 2017-06-15 NOTE — Progress Notes (Signed)
Patient ID: [name] Herring, male   DOB: November 04, 1960, 56 y.o.   MRN: [id number]          [facility] for Infectious Disease    Date of Admission:  06/12/2017     [name] Herring is unchanged overnight. He is resting quietly in bed. He remains thin and weak. He is awaiting endoscopy and colonoscopy today. I recommend continuing [place]. Please call Dr. Talbot Grumbling [phone]) for any infectious disease questions this weekend.  Michel Bickers, MD [facility] for Infect[facility] [phone] pager   [phone] cell 06/15/2017, 10:30 AM

## 2017-06-15 NOTE — Progress Notes (Signed)
PROGRESS NOTE    [name]  EKC:[id number] DOB: 03-28-1961 DOA: 06/12/2017 PCP: Arnoldo Morale, MD  Brief Narrative:[name] Herring a 56 y.o.malewith medical history significant of hypertension, depression, polysubstance abuse (tobacco, alcohol, cocaine), DVT, PE on eliquis. , HIV/AIDs (CD4 to 50 and viral load 1.477 on 05/30/17), schizophrenia, immunodeficiency disorder, VERY POOR HISTORIAN who presented with chest pain, shortness breath, nausea, vomiting, diarrhea, abdominal pain, rectal bleeding and anemia.   Assessment & Plan:   GI Bleed/Anemia/heme pos stools -Status post 2 units of PRBC since admission -History of NSAID and alcohol abuse -GI consulting, plan for EGD/Colonoscopy this afternoon, once eliquis washed out -transfuse 1 unit PRBC today  Human immunodeficiency virus (HIV) disease ([place]): CD4 =50 and viral load 1.477 on 05/30/17, I question his compliance, per ID this can fluctuate a little in setting of infection -continue home HIV meds -pt is on Bactrim and fluconazole forprophylaxis  Hx of PE and DVT: pt is on Eliquis -Patient reports compliance but this is likely questionable -VQ scan with low probability of PE and history of IVC filte -currently holding Eliquis in setting of GI bleeding  Chest pain -resolved -troponin negative x3 -EKG abnormal with ST changes, ECHO with EF of 50-55% and normal wall motion  AKI: pt's Cre is 1.73 and BUN 50 on admission. Likely due to prerenal secondary to dehydration and continuation of ACEI, NSAIDs and Bactrim. -cut down IVF, creatinine improved  Protein-calorie malnutrition, severe ([name]): -Nutrition consult -po intake remains poor  Nausea vomiting and diarrhea: -resolved  HTN: -Continue metoprolol -Holding lisinopril  Schizophrenia and depression: -Continue haldol, Remeron,Valbenzine, Cogentin -will cut down doses of COgentin and haldol due to increased somolence, change to PRN Haldol  Tobacco abuse and  Alcohol abuse: -Nicotine patch -CIWA protocol  Prophylaxis: SCD Code Status: DNR  Family Communication: sister at bedside notes ongoing decline and FTT Disposition Plan: plan for SNF with Palliative care FU  Consultants:   [facility]  Subjective: Feels better today, more alert, no distress  Objective: Vitals:   06/14/17 2125 06/15/17 0202 06/15/17 0459 06/15/17 0848  BP: 107/76 (!) 88/54 (!) 94/56   Pulse: 71 62 60   Resp: 16 14 16    Temp: 97.7 F (36.5 C) 97.9 F (36.6 C) 97.7 F (36.5 C)   TempSrc: Oral Oral Oral   SpO2: 100% 100% 100% 94%  Weight:      Height:        Intake/Output Summary (Last 24 hours) at 06/15/17 1321 Last data filed at 06/15/17 0643  Gross per 24 hour  Intake           1407.5 ml  Output             1000 ml  Net            407.5 ml   Filed Weights   06/12/17 1326 06/13/17 0519  Weight: 53.9 kg (118 lb 13.3 oz) 57.9 kg (127 lb 10.3 oz)    Examination:  Gen: chronically ill male, frail, laying in bed, more alert and lucid this am HEENT: PERRLA, Neck supple, no JVD Lungs: Good air movement bilaterally, CTAB CVS: RRR,No Gallops,Rubs or new Murmurs Abd: soft, Non tender, non distended, BS present Extremities: No Cyanosis, Clubbing or edema Skin: no new rashes     Data Reviewed:   CBC:  Recent Labs Lab 06/12/17 0326  06/13/17 0823 06/13/17 1922 06/14/17 0509 06/14/17 1435 06/15/17 0244  WBC 11.6*  < > 5.5 5.6 4.2 4.0 3.6*  NEUTROABS 9.7*  --   --   --   --   --   --   HGB 8.1*  < > 8.2* 7.8* 7.7* 7.6* 7.5*  HCT 23.6*  < > 24.4* 22.9* 23.0* 22.5* 22.3*  MCV 89.1  < > 89.7 91.6 92.4 89.6 91.8  PLT 159  < > 113* 104* 112* 100* 101*  < > = values in this interval not displayed. Basic Metabolic Panel:  Recent Labs Lab 06/12/17 0326 06/13/17 0316 06/14/17 0518  NA 136 142 138  K 4.2 4.4 4.0  CL 105 114* 111  CO2 22 22 22   GLUCOSE 145* 83 87  BUN 50* 28* 17  CREATININE 1.73* 1.03 1.01  CALCIUM 9.1 8.4* 8.4*    GFR: Estimated Creatinine Clearance: 67.7 mL/min (by C-G formula based on SCr of 1.01 mg/dL). Liver Function Tests: No results for input(s): AST, ALT, ALKPHOS, BILITOT, PROT, ALBUMIN in the last 168 hours. No results for input(s): LIPASE, AMYLASE in the last 168 hours. No results for input(s): AMMONIA in the last 168 hours. Coagulation Profile:  Recent Labs Lab 06/12/17 0533  INR 1.03   Cardiac Enzymes:  Recent Labs Lab 06/12/17 0326 06/12/17 0533 06/12/17 1322 06/13/17 0316  TROPONINI <0.03 <0.03 <0.03 <0.03   BNP (last 3 results) No results for input(s): PROBNP in the last 8760 hours. HbA1C: No results for input(s): HGBA1C in the last 72 hours. CBG:  Recent Labs Lab 06/12/17 0824 06/13/17 0737 06/14/17 0741 06/15/17 0759  GLUCAP 117* 82 120* 87   Lipid Profile: No results for input(s): CHOL, HDL, LDLCALC, TRIG, CHOLHDL, LDLDIRECT in the last 72 hours. Thyroid Function Tests: No results for input(s): TSH, T4TOTAL, FREET4, T3FREE, THYROIDAB in the last 72 hours. Anemia Panel: No results for input(s): VITAMINB12, FOLATE, FERRITIN, TIBC, IRON, RETICCTPCT in the last 72 hours. Urine analysis:    Component Value Date/Time   COLORURINE YELLOW 05/29/2017 1832   APPEARANCEUR CLEAR 05/29/2017 1832   LABSPEC 1.006 05/29/2017 1832   PHURINE 6.0 05/29/2017 1832   GLUCOSEU NEGATIVE 05/29/2017 1832   GLUCOSEU NEG mg/dL 10/11/2006 2016   HGBUR NEGATIVE 05/29/2017 1832   BILIRUBINUR NEGATIVE 05/29/2017 1832   KETONESUR NEGATIVE 05/29/2017 1832   PROTEINUR NEGATIVE 05/29/2017 1832   UROBILINOGEN 1 10/11/2006 2016   NITRITE NEGATIVE 05/29/2017 1832   LEUKOCYTESUR NEGATIVE 05/29/2017 1832   Sepsis Labs: @LABRCNTIP (procalcitonin:4,lacticidven:4)  ) Recent Results (from the past 240 hour(s))  MRSA PCR Screening     Status: None   Collection Time: 06/12/17  2:35 PM  Result Value Ref Range Status   MRSA by PCR NEGATIVE NEGATIVE Final    Comment:        The  GeneXpert MRSA Assay (FDA approved for NASAL specimens only), is one component of a comprehensive MRSA colonization surveillance program. It is not intended to diagnose MRSA infection nor to guide or monitor treatment for MRSA infections.          Radiology Studies: No results found.      Scheduled Meds: . benztropine  0.5 mg Oral Daily   And  . benztropine  1 mg Oral QHS  . dextromethorphan-guaiFENesin  1 tablet Oral BID  . elvitegravir-cobicistat-emtricitabine-tenofovir  1 tablet Oral Q breakfast  . fluconazole  100 mg Oral Weekly  . folic acid  1 mg Oral Daily  . gabapentin  600 mg Oral QHS  . LORazepam  0-4 mg Intravenous Q12H  . magnesium oxide  400 mg Oral BID  . metoprolol tartrate  25 mg Oral BID  . mirtazapine  15 mg Oral QHS  . multivitamin with minerals  1 tablet Oral Daily  . nicotine  21 mg Transdermal Daily  . pantoprazole  40 mg Intravenous Q12H  . sulfamethoxazole-trimethoprim  1 tablet Oral Daily  . thiamine  100 mg Oral Daily  . tiotropium  18 mcg Inhalation Daily  . Valbenazine Tosylate  80 mg Oral BID   Continuous Infusions: . sodium chloride 20 mL/hr at 06/15/17 0913     LOS: 3 days    Time spent: 37min    Domenic Polite, MD [facility] Pager [phone]  If 7PM-7AM, please contact night-coverage [email or web address] Password [email or web address] 06/15/2017, 1:21 PM

## 2017-06-15 NOTE — Op Note (Signed)
[facility] Patient Name: [name] Herring Procedure Date: 06/15/2017 MRN: [id number] Attending MD: Docia Chuck. Henrene Pastor , MD Date of Birth: Jun 04, 1961 CSN: [id number] Age: 56 Admit Type: Inpatient Procedure:                Upper GI endoscopy, with biopsy, with control of                            bleeding Indications:              Hematochezia, Melena Providers:                Docia Chuck. Henrene Pastor, MD, Burtis Junes, RN, Alan Mulder,                            Technician Referring MD:              Medicines:                Monitored Anesthesia Care Complications:            No immediate complications. Estimated Blood Loss:     Estimated blood loss: none. Procedure:                Pre-Anesthesia Assessment:                           - Prior to the procedure, a History and Physical                            was performed, and patient medications and                            allergies were reviewed. The patient's tolerance of                            previous anesthesia was also reviewed. The risks                            and benefits of the procedure and the sedation                            options and risks were discussed with the patient.                            All questions were answered, and informed consent                            was obtained. Prior Anticoagulants: The patient has                            taken Eliquis (apixaban), last dose was 3 days                            prior to procedure. ASA Grade Assessment: III - A  patient with severe systemic disease. After                            reviewing the risks and benefits, the patient was                            deemed in satisfactory condition to undergo the                            procedure.                           After obtaining informed consent, the endoscope was                            passed under direct vision. Throughout the   procedure, the patient's blood pressure, pulse, and                            oxygen saturations were monitored continuously. The                            [id number]) scope was introduced through the                            mouth, and advanced to the second part of duodenum.                            The upper GI endoscopy was accomplished without                            difficulty. The patient tolerated the procedure                            well. Scope In: Scope Out: Findings:      The esophagus was normal.      The stomach was normal, except for several tiny prepyloric ulcers.      One non-bleeding cratered duodenal ulcer with pigmented material was       found in the duodenal bulb. The lesion was 30 mm in largest dimension.       To prevent bleeding post-intervention, one hemostatic clip was       successfully placed. There was no bleeding at the end of the procedure.       Biopsies were taken , from the gastric antrum, with a cold forceps for       Helicobacter pylori testing using CLOtest.      The post bulbar duodenum was normal.      The cardia and gastric fundus were normal on retroflexion. Impression:               - Normal esophagus.                           - Normal stomach, saved tiny prepyloric ulcers.                           -  One non-bleeding GIANT duodenal ulcer with                            pigmented protuberance which was clipped. Biopsies                            for Helicobacter pylori taken                           - Normal post bulbar duodenum. Moderate Sedation:      Moderate (conscious) sedation was administered by the endoscopy nurse       and supervised by the endoscopist. The following parameters were       monitored: oxygen saturation, heart rate, blood pressure, and response       to care. Total physician intraservice time was 15 minutes. Recommendation:           1. Clear liquid diet                           2. Continue PPI  twice a day                           3. Avoid all NSAIDs                           4. Follow-up CLO test and treat if positive                           5. GI will follow Procedure Code(s):        --- Professional ---                           [id number], Esophagogastroduodenoscopy, flexible,                            transoral; with biopsy, single or multiple                           99152, Moderate sedation services provided by the                            same physician or other qualified health care                            professional performing the diagnostic or                            therapeutic service that the sedation supports,                            requiring the presence of an independent trained                            observer to assist in the monitoring of the  patient's level of consciousness and physiological                            status; initial 15 minutes of intraservice time,                            patient age 71 years or older Diagnosis Code(s):        --- Professional ---                           K26.9, Duodenal ulcer, unspecified as acute or                            chronic, without hemorrhage or perforation                           K92.1, Melena (includes Hematochezia) CPT copyright 2016 American Medical Association. All rights reserved. The codes documented in this report are preliminary and upon coder review may  be revised to meet current compliance requirements. Docia Chuck. Henrene Pastor, MD 06/15/2017 4:30:14 PM This report has been signed electronically. Number of Addenda: 0

## 2017-06-15 NOTE — Plan of Care (Signed)
Problem: Safety: Goal: Ability to remain free from injury will improve Outcome: Progressing Up with one assist to [facility].  Calls appropriately.   Continue to monitor.   Problem: Health Behavior/Discharge Planning: Goal: Ability to manage health-related needs will improve Outcome: Progressing .  Problem: Pain Managment: Goal: General experience of comfort will improve Outcome: Progressing Denies pain at this time.   Will continue to monitor.   Problem: Physical Regulation: Goal: Ability to maintain clinical measurements within normal limits will improve Outcome: Progressing . Goal: Will remain free from infection Outcome: Progressing .  Problem: Skin Integrity: Goal: Risk for impaired skin integrity will decrease Outcome: Progressing .  Problem: Tissue Perfusion: Goal: Risk factors for ineffective tissue perfusion will decrease Outcome: Progressing .  Problem: Activity: Goal: Risk for activity intolerance will decrease Outcome: Progressing .  Problem: Fluid Volume: Goal: Ability to maintain a balanced intake and output will improve Outcome: Progressing .  Problem: Nutrition: Goal: Adequate nutrition will be maintained Outcome: Progressing NPO right now, was tolerating clears yesterday.  After procedure will continue with plan of care.    Problem: Bowel/Gastric: Goal: Will not experience complications related to bowel motility Outcome: Progressing .

## 2017-06-15 NOTE — Care Management Important Message (Signed)
Important Message  Patient Details  Name: [name] Herring MRN: [id number] Date of Birth: Mar 21, 1961   Medicare Important Message Given:  Yes    Kerin Salen 06/15/2017, 1:03 [place] Message  Patient Details  Name: [name] Herring MRN: [id number] Date of Birth: 1961-06-17   Medicare Important Message Given:  Yes    Kerin Salen 06/15/2017, 1:03 PM

## 2017-06-15 NOTE — Interval H&P Note (Signed)
History and Physical Interval Note:  06/15/2017 3:20 PM  [name]  has presented today for surgery, with the diagnosis of GI bleed  The various methods of treatment have been discussed with the patient and family. After consideration of risks, benefits and other options for treatment, the patient has consented to  Procedure(s): COLONOSCOPY WITH PROPOFOL (N/A) ESOPHAGOGASTRODUODENOSCOPY (EGD) WITH PROPOFOL (N/A) as a surgical intervention .  The patient's history has been reviewed, patient examined, no change in status, stable for surgery.  I have reviewed the patient's chart and labs.  Questions were answered to the patient's satisfaction.     [name] Herring
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
## 2017-06-15 NOTE — Op Note (Signed)
[facility] Patient Name: [name] Herring Procedure Date: 06/15/2017 MRN: [id number] Attending MD: Docia Chuck. Henrene Pastor , MD Date of Birth: 24-Nov-1960 CSN: [id number] Age: 56 Admit Type: Inpatient Procedure:                Colonoscopy, with cold snare polypectomy x 1 Indications:              Melena, Rectal bleeding Providers:                Docia Chuck. Henrene Pastor, MD, Burtis Junes, RN, Alan Mulder,                            Technician Referring MD:             [facility] Medicines:                Monitored Anesthesia Care Complications:            No immediate complications. Estimated blood loss:                            None. Estimated Blood Loss:     Estimated blood loss: none. Procedure:                Pre-Anesthesia Assessment:                           - Prior to the procedure, a History and Physical                            was performed, and patient medications and                            allergies were reviewed. The patient's tolerance of                            previous anesthesia was also reviewed. The risks                            and benefits of the procedure and the sedation                            options and risks were discussed with the patient.                            All questions were answered, and informed consent                            was obtained. Prior Anticoagulants: The patient has                            taken Eliquis (apixaban), last dose was 3 days                            prior to procedure. ASA Grade Assessment: III - A  patient with severe systemic disease. After                            reviewing the risks and benefits, the patient was                            deemed in satisfactory condition to undergo the                            procedure.                           After obtaining informed consent, the colonoscope                            was passed under direct vision. Throughout  the                            procedure, the patient's blood pressure, pulse, and                            oxygen saturations were monitored continuously. The                            EC-[id number] ([id number]) scope was introduced through                            the anus and advanced to the the cecum, identified                            by appendiceal orifice and ileocecal valve. The                            ileocecal valve, appendiceal orifice, and rectum                            were photographed. The quality of the bowel                            preparation was good. The colonoscopy was performed                            without difficulty. The patient tolerated the                            procedure well. The bowel preparation used was                            MoviPrep. Scope In: 3:34:59 PM Scope Out: 3:58:26 PM Scope Withdrawal Time: 0 hours 8 minutes 3 seconds  Total Procedure Duration: 0 hours 23 minutes 27 seconds  Findings:      A 5 mm polyp was found in the transverse colon. The polyp was       pedunculated. The polyp was removed with a cold snare. Resection and  retrieval were complete.      The exam was otherwise without abnormality on direct and retroflexion       views. Impression:               - One 5 mm polyp in the transverse colon, removed                            with a cold snare. Resected and retrieved.                           - The examination was otherwise normal on direct                            and retroflexion views. Moderate Sedation:      Moderate (conscious) sedation was administered by the endoscopy nurse       and supervised by the endoscopist. The following parameters were       monitored: oxygen saturation, heart rate, blood pressure, and response       to care. Total physician intraservice time was 30 minutes. Recommendation:           - Repeat colonoscopy in 5-10 years for surveillance.                           - Proceed  to EGD. See report. .                           - Await pathology results. Procedure Code(s):        --- Professional ---                           [phone], Colonoscopy, flexible; with removal of                            tumor(s), polyp(s), or other lesion(s) by snare                            technique Diagnosis Code(s):        --- Professional ---                           D12.3, Benign neoplasm of transverse colon (hepatic                            flexure or splenic flexure)                           K92.1, Melena (includes Hematochezia)                           K62.5, Hemorrhage of anus and rectum CPT copyright 2016 American Medical Association. All rights reserved. The codes documented in this report are preliminary and upon coder review may  be revised to meet current compliance requirements. Docia Chuck. Henrene Pastor, MD 06/15/2017 4:18:29 PM This report has been signed electronically. Number of Addenda: 0

## 2017-06-15 NOTE — H&P (View-Only) (Signed)
[facility] Progress Note  Subjective:  Nurse reports more dark, bloody stool overnight.  Hgb down slightly.  Objective:  Vital signs in last 24 hours: Temp:  [97.3 F (36.3 C)-97.8 F (36.6 C)] 97.7 F (36.5 C) (09/20 0545) Pulse Rate:  [51-63] 63 (09/20 0545) Resp:  [10-15] 11 (09/20 0545) BP: (90-109)/(59-74) 104/63 (09/20 0545) SpO2:  [98 %-100 %] 100 % (09/20 0545) Last BM Date: 06/13/17 General:  Alert, cachectic with temporal wasting, in NAD; sleepy Heart:  Regular rate and rhythm; no murmurs Pulm:  CTAB.  No increased WOB. Abdomen:  Soft, non-distended.  BS present.  Non-tender. Extremities:  Without edema.  Intake/Output from previous day: 09/19 0701 - 09/20 0700 In: 1478.3 [P.O.:120; I.V.:1358.3] Out: 725 [Urine:725]  Lab Results:  Recent Labs  06/13/17 0823 06/13/17 1922 06/14/17 0509  WBC 5.5 5.6 4.2  HGB 8.2* 7.8* 7.7*  HCT 24.4* 22.9* 23.0*  PLT 113* 104* 112*   BMET  Recent Labs  06/12/17 0326 06/13/17 0316 06/14/17 0518  NA 136 142 138  K 4.2 4.4 4.0  CL 105 114* 111  CO2 22 22 22   GLUCOSE 145* 83 87  BUN 50* 28* 17  CREATININE 1.73* 1.03 1.01  CALCIUM 9.1 8.4* 8.4*   PT/INR  Recent Labs  06/12/17 0533  LABPROT 13.4  INR 1.03   Nm Pulmonary Perf And Vent  Result Date: 06/12/2017 CLINICAL DATA:  Shortness of breath, chest pain. EXAM: NUCLEAR MEDICINE VENTILATION - PERFUSION LUNG SCAN TECHNIQUE: Ventilation images were obtained in multiple projections using inhaled aerosol Tc-33m DTPA. Perfusion images were obtained in multiple projections after intravenous injection of Tc-67m MAA. RADIOPHARMACEUTICALS:  28.9 mCi Technetium-45m DTPA aerosol inhalation and 4.8 mCi Technetium-4m MAA IV COMPARISON:  Chest x-ray 06/12/2017 FINDINGS: Ventilation: Patchy diffuse nonsegmental ventilation defects. Perfusion: Matching patchy diffuse nonsegmental perfusion defects. IMPRESSION: Diffuse nonsegmental ventilation and perfusion  defects throughout the lungs, most compatible with obstructive lung disease. Low probability for pulmonary embolus. Electronically Signed   By: Rolm Baptise M.D.   On: 06/12/2017 12:10   Assessment / Plan: 1. 56 yo male with three day history of blood in stool on Eliquis. He is poor historian. Admits to associated rectal discomfort, abdominal discomfort, nausea / vomiting and recent constipation. Gives very little details but is clear that he has no history of GI bleeding. Hgb down several grams from baseline on admission. This could be diverticular bleeding. Apparently he has a history of cancer in situ of rectum found in 2011 at [facility].  This was found in history from one of Infectious Diseases office notes. Nothing in Care Everywhere about this.Upper GI bleeding not excluded, especially given daily NSAID use. -agree with BID PPI -continue supportive care -colonoscopy and possible EGD tomorrow, 9/21  2. Anemia of acute blood loss:  Hgb increased appropriately by 2 grams with 2 units PRBC's.  It is down slightly again today. -continue to monitor Hgb and transfuse prn  3. AKI:  Improved.  4. HIV, abs CD4 count of 50  5. Hx of DVT / PE with right heart strain. He has an IVC. On Eliquis at home--this has been on hold.   LOS: 2 days   ZEHR, JESSICA D.  06/14/2017, 9:24 AM  Pager number [phone]  GI ATTENDING  Interval history data reviewed. Patient seen and examined. Tentative plans for endoscopic evaluations to evaluate GI bleeding tomorrow. He is high-risk. I'm highly concerned about his overall prognosis, particularly given his current CD4 count.  With his multiple physical and social issues, I do not have a good feel for his long-term care plan. I would ask infectious diseases to weigh in regarding current and future treatment plans as well as expected prognosis.  Docia Chuck. Geri [place]in[place]., M.D. [facility] Division of Gastroenterology

## 2017-06-15 NOTE — Progress Notes (Signed)
OT Cancellation Note  Patient Details Name: [name] Herring MRN: [id number] DOB: Feb 22, 1961   Cancelled Treatment:    Reason Eval/Treat Not Completed: Other (comment). Spoke to RN and pt is going for colonoscopy and getting blood afterwards, said to hold for today on eval for today.  Almon Register [phone] 06/15/2017, 2:49 PM

## 2017-06-15 NOTE — Progress Notes (Signed)
PT Cancellation Note  Patient Details Name: [name] Herring MRN: [id number] DOB: December 17, 1960   Cancelled Treatment:    Reason Eval/Treat Not Completed: Patient at procedure or test/unavailable (also to receive blood after procedure per RN)   York Ram E 06/15/2017, 3:18 PM Carmelia Bake, PT, DPT 06/15/2017 Pager: [phone]

## 2017-06-16 DIAGNOSIS — K269 Duodenal ulcer, unspecified as acute or chronic, without hemorrhage or perforation: Secondary | ICD-10-CM

## 2017-06-16 LAB — CBC
HEMATOCRIT: 26.2 % — AB (ref 39.0–52.0)
HEMOGLOBIN: 9 g/dL — AB (ref 13.0–17.0)
MCH: 30.3 pg (ref 26.0–34.0)
MCHC: 34.4 g/dL (ref 30.0–36.0)
MCV: 88.2 fL (ref 78.0–100.0)
Platelets: 127 10*3/uL — ABNORMAL LOW (ref 150–400)
RBC: 2.97 MIL/uL — ABNORMAL LOW (ref 4.22–5.81)
RDW: 18.8 % — ABNORMAL HIGH (ref 11.5–15.5)
WBC: 2.9 10*3/uL — AB (ref 4.0–10.5)

## 2017-06-16 LAB — BASIC METABOLIC PANEL
ANION GAP: 6 (ref 5–15)
BUN: 6 mg/dL (ref 6–20)
CALCIUM: 8.5 mg/dL — AB (ref 8.9–10.3)
CO2: 22 mmol/L (ref 22–32)
Chloride: 111 mmol/L (ref 101–111)
Creatinine, Ser: 0.71 mg/dL (ref 0.61–1.24)
GLUCOSE: 76 mg/dL (ref 65–99)
POTASSIUM: 3.9 mmol/L (ref 3.5–5.1)
SODIUM: 139 mmol/L (ref 135–145)

## 2017-06-16 LAB — GLUCOSE, CAPILLARY: GLUCOSE-CAPILLARY: 87 mg/dL (ref 65–99)

## 2017-06-16 LAB — CLOTEST (H. PYLORI), BIOPSY: HELICOBACTER SCREEN: NEGATIVE — AB

## 2017-06-16 NOTE — NC FL2 (Signed)
[place] LEVEL OF CARE SCREENING TOOL     IDENTIFICATION  Patient Name: [name] Herring Birthdate: 1960/10/04 Sex: male Admission Date (Current Location): 06/12/2017  [place] and [place] Number:  Herbalist and Address:  [facility],  [place] [address], [place]      Provider Number: [id number]  Attending Physician Name and Address:  Domenic Polite, MD  Relative Name and Phone Number:       Current Level of Care: Hospital Recommended Level of Care: [place] Prior Approval Number:    Date Approved/Denied:   PASRR Number:    Discharge Plan: SNF    Current Diagnoses: Patient Active Problem List   Diagnosis Date Noted  . Benign neoplasm of transverse colon   . Duodenal ulcer with hemorrhage   . Unintentional weight loss 06/14/2017  . Encounter for palliative care   . Goals of care, counseling/discussion   . Hematochezia   . Acute blood loss anemia   . Coagulopathy ([place])   . Abnormal CT of the abdomen   . GIB (gastrointestinal bleeding) 06/12/2017  . AKI (acute kidney injury) ([place]) 06/12/2017  . Protein-calorie malnutrition, severe ([place]) 06/12/2017  . Chest pain 06/12/2017  . Nausea vomiting and diarrhea 06/12/2017  . Emphysema lung ([place]) 11/20/2016  . DVT of lower extremity, bilateral ([place]) 11/04/2016  . Tobacco abuse 11/02/2016  . PE (pulmonary thromboembolism) ([place]) 11/02/2016  . Elevated lactic acid level 11/02/2016  . Cocaine dependence with cocaine-induced mood disorder ([place]) 03/29/2016  . HTN (hypertension) 08/25/2015  . Paranoid schizophrenia, chronic condition ([place]) 12/30/2014  . Cocaine use disorder, severe, dependence ([place]) 12/27/2014  . Alcohol use disorder, severe, dependence ([place]) 12/27/2014  . ERECTILE DYSFUNCTION 09/03/2007  . FOOT PAIN, BILATERAL 07/10/2007  . Human immunodeficiency virus (HIV) disease ([place]) 11/09/2006  . CA IN SITU, RECTUM 11/09/2006  . Hypothyroidism 11/09/2006  .  DISORDER, BIPOLAR NOS 11/09/2006  . Depression 11/09/2006  . PYELONEPHRITIS 11/09/2006    Orientation RESPIRATION BLADDER Height & Weight     Self, Time, Situation, Place  Normal Continent Weight: 127 lb 10.3 oz (57.9 kg) Height:  5\' 10"  (177.8 cm)  BEHAVIORAL SYMPTOMS/MOOD NEUROLOGICAL BOWEL NUTRITION STATUS      Continent Diet (see DC summary)  AMBULATORY STATUS COMMUNICATION OF NEEDS Skin   Limited Assist Verbally Normal                       Personal Care Assistance Level of Assistance  Bathing, Dressing Bathing Assistance: Limited assistance   Dressing Assistance: Limited assistance     Functional Limitations Info             SPECIAL CARE FACTORS FREQUENCY  PT (By licensed PT), OT (By licensed OT)     PT Frequency: 5/wk OT Frequency: 5/wk            Contractures      Additional Factors Info  Code Status, Allergies, Psychotropic Code Status Info: DNR Allergies Info: Amoxicillin, Latex Psychotropic Info: remeron         Current Medications (06/16/2017):  This is the current hospital active medication list Current Facility-Administered Medications  Medication Dose Route Frequency Provider Last Rate Last Dose  . acetaminophen (TYLENOL) tablet 500 mg  500 mg Oral Q6H PRN Ivor Costa, MD   500 mg at 06/13/17 2303  . albuterol (PROVENTIL) (2.5 MG/3ML) 0.083% nebulizer solution 2.5 mg  2.5 mg Nebulization Q4H PRN Ivor Costa, MD      .  benztropine (COGENTIN) tablet 0.5 mg  0.5 mg Oral Daily Domenic Polite, MD   0.5 mg at 06/16/17 0912   And  . benztropine (COGENTIN) tablet 1 mg  1 mg Oral QHS Domenic Polite, MD   1 mg at 06/15/17 2032  . dextromethorphan-guaiFENesin (MUCINEX DM) 30-600 MG per 12 hr tablet 1 tablet  1 tablet Oral BID Ivor Costa, MD   1 tablet at 06/16/17 0913  . elvitegravir-cobicistat-emtricitabine-tenofovir (GENVOYA) 150-150-200-10 MG tablet 1 tablet  1 tablet Oral Q breakfast Ivor Costa, MD   1 tablet at 06/16/17 [phone]  . fluconazole  (DIFLUCAN) tablet 100 mg  100 mg Oral Weekly Ivor Costa, MD   100 mg at 06/13/17 0800  . folic acid (FOLVITE) tablet 1 mg  1 mg Oral Daily Ivor Costa, MD   1 mg at 06/16/17 0913  . gabapentin (NEURONTIN) capsule 600 mg  600 mg Oral QHS Ivor Costa, MD   600 mg at 06/15/17 2032  . haloperidol (HALDOL) tablet 5 mg  5 mg Oral Q12H PRN Domenic Polite, MD      . hydrALAZINE (APRESOLINE) injection 5 mg  5 mg Intravenous Q2H PRN Ivor Costa, MD      . magnesium oxide (MAG-OX) tablet 400 mg  400 mg Oral BID Ivor Costa, MD   400 mg at 06/16/17 0913  . metoprolol tartrate (LOPRESSOR) tablet 25 mg  25 mg Oral BID Ivor Costa, MD   25 mg at 06/16/17 0914  . mirtazapine (REMERON) tablet 15 mg  15 mg Oral QHS Ivor Costa, MD   15 mg at 06/15/17 2033  . multivitamin with minerals tablet 1 tablet  1 tablet Oral Daily Ivor Costa, MD   1 tablet at 06/16/17 0911  . nicotine (NICODERM CQ - dosed in mg/24 hours) patch 21 mg  21 mg Transdermal Daily Ivor Costa, MD   21 mg at 06/16/17 0913  . nitroGLYCERIN (NITROSTAT) SL tablet 0.4 mg  0.4 mg Sublingual Q5 min PRN Ivor Costa, MD      . ondansetron [facility]) tablet 4 mg  4 mg Oral Q6H PRN Ivor Costa, MD       Or  . ondansetron [facility]) injection 4 mg  4 mg Intravenous Q6H PRN Ivor Costa, MD      . oxyCODONE (Oxy IR/ROXICODONE) immediate release tablet 10 mg  10 mg Oral Q6H PRN Ivor Costa, MD   10 mg at 06/13/17 2023  . pantoprazole (PROTONIX) injection 40 mg  40 mg Intravenous Q12H Montine Circle, PA-C   40 mg at 06/16/17 0914  . sulfamethoxazole-trimethoprim (BACTRIM DS,SEPTRA DS) 800-160 MG per tablet 1 tablet  1 tablet Oral Daily Ivor Costa, MD   1 tablet at 06/16/17 0915  . thiamine (VITAMIN B-1) tablet 100 mg  100 mg Oral Daily Ivor Costa, MD   100 mg at 06/16/17 0915  . tiotropium (SPIRIVA) inhalation capsule 18 mcg  18 mcg Inhalation Daily Ivor Costa, MD   18 mcg at 06/16/17 9604  . Valbenazine Tosylate CAPS 80 mg  80 mg Oral BID Ivor Costa, MD         Discharge  Medications: Please see discharge summary for a list of discharge medications.  Relevant Imaging Results:  Relevant Lab Results:   Additional Information SS#: [id number]; on Genvoya for HIV  Jorge Ny, LCSW

## 2017-06-16 NOTE — Evaluation (Signed)
Physical Therapy Evaluation Patient Details Name: [name] Herring MRN: [id number] DOB: 10/11/1960 Today's Date: 06/16/2017   History of Present Illness  56 y.o. male with medical history significant of hypertension, depression, polysubstance abuse (tobacco, alcohol, cocaine), DVT, PE on eliquis. , HIV/AIDs (CD4 to 50 and viral load 1.477 on 05/30/17), schizophrenia, and admitted with Large duodenal ulcer with GI bleeding and acute blood loss anemia  Clinical Impression  Pt admitted with above diagnosis. Pt currently with functional limitations due to the deficits listed below (see PT Problem List).  Pt will benefit from skilled PT to increase their independence and safety with mobility to allow discharge to the venue listed below.  Pt assisted with ambulating in hallway.  Pt reports d/c plan is back to his apt however per chart, plans for palliative and possible SNF.     Follow Up Recommendations Supervision/Assistance - 24 hour;SNF    Equipment Recommendations  Rolling walker with 5" wheels    Recommendations for Other Services       Precautions / Restrictions Precautions Precautions: Fall      Mobility  Bed Mobility Overal bed mobility: Modified Independent                Transfers Overall transfer level: Needs assistance Equipment used: None Transfers: Sit to/from Stand Sit to Stand: Min guard         General transfer comment: min/guard for safety  Ambulation/Gait Ambulation/Gait assistance: Min guard Ambulation Distance (Feet): 120 Feet Assistive device: None Gait Pattern/deviations: Step-through pattern;Decreased stride length     General Gait Details: stiff upper body during gait, leads gait with LEs, distance to tolerance  Stairs            Wheelchair Mobility    Modified Rankin (Stroke Patients Only)       Balance Overall balance assessment: Needs assistance         Standing balance support: No upper extremity supported Standing  balance-Leahy Scale: Fair                               Pertinent Vitals/Pain Pain Assessment: No/denies pain    Home Living Family/patient expects to be discharged to:: Private residence Living Arrangements: Alone   Type of Home: Apartment Home Access: Stairs to enter Entrance Stairs-Rails: Right Entrance Stairs-Number of Steps: 2nd floor apt Home Layout: One level Home Equipment: None      Prior Function Level of Independence: Independent               Hand Dominance        Extremity/Trunk Assessment   Upper Extremity Assessment Upper Extremity Assessment: Generalized weakness    Lower Extremity Assessment Lower Extremity Assessment: Generalized weakness (diffuse muscle atrophy)       Communication   Communication: No difficulties  Cognition Arousal/Alertness: Awake/alert Behavior During Therapy: WFL for tasks assessed/performed Overall Cognitive Status: Within Functional Limits for tasks assessed                                 General Comments: poor historian per chart, appropriate during session      General Comments      Exercises     Assessment/Plan    PT Assessment Patient needs continued PT services  PT Problem List Decreased strength;Decreased mobility;Decreased balance;Decreased knowledge of use of DME;Decreased activity tolerance       PT  Treatment Interventions Gait training;DME instruction;Therapeutic activities;Therapeutic exercise;Patient/family education;Functional mobility training    PT Goals (Current goals can be found in the Care Plan section)  Acute Rehab PT Goals PT Goal Formulation: With patient Time For Goal Achievement: 06/23/17 Potential to Achieve Goals: Good    Frequency Min 3X/week   Barriers to discharge        Co-evaluation               AM-PAC PT "6 Clicks" Daily Activity  Outcome Measure Difficulty turning over in bed (including adjusting bedclothes, sheets and  blankets)?: None Difficulty moving from lying on back to sitting on the side of the bed? : None Difficulty sitting down on and standing up from a chair with arms (e.g., wheelchair, bedside commode, etc,.)?: A Little Help needed moving to and from a bed to chair (including a wheelchair)?: A Little Help needed walking in hospital room?: A Little Help needed climbing 3-5 steps with a railing? : A Lot 6 Click Score: 19    End of Session Equipment Utilized During Treatment: Gait belt Activity Tolerance: Patient tolerated treatment well Patient left: in chair;with call bell/phone within reach;with chair alarm set   PT Visit Diagnosis: Difficulty in walking, not elsewhere classified (R26.2);Muscle weakness (generalized) (M62.81)    Time: 6294-7654 PT Time Calculation (min) (ACUTE ONLY): 8 min   Charges:   PT Evaluation $PT Eval Low Complexity: 1 Low     PT G Codes:       Carmelia Bake, PT, DPT 06/16/2017 Pager: [phone]  York Ram E 06/16/2017, 1:24 PM

## 2017-06-16 NOTE — Progress Notes (Signed)
      Progress Note   Subjective  Patient feeling better. Sister in room this morning, states he looks much better. He has some mild abdominal pain. Tolerating liquids, no vomiting. No blood in the stools. Transfusion of PRBC yesterday.   Objective   Vital signs in last 24 hours: Temp:  [97.5 F (36.4 C)-98.6 F (37 C)] 97.8 F (36.6 C) (09/22 0439) Pulse Rate:  [57-85] 57 (09/22 0439) Resp:  [14-27] 20 (09/22 0439) BP: (100-128)/(59-94) 100/59 (09/22 0439) SpO2:  [96 %-100 %] 96 % (09/22 0925) Last BM Date: 06/15/17 General:    AA male in NAD Heart:  Regular rate and rhythm; no murmurs Lungs: Respirations even and unlabored, lungs CTA bilaterally Abdomen:  Soft, mild abdominal TTP, nondistended.  Extremities:  Without edema. Neurologic:  Alert and oriented,  grossly normal neurologically. Psych:  Cooperative. Normal mood and affect.  Intake/Output from previous day: 09/21 0701 - 09/22 0700 In: 1320 [P.O.:960; Blood:360] Out: 600 [Urine:600] Intake/Output this shift: Total I/O In: 544.2 [P.O.:480; I.V.:64.2] Out: 0   Lab Results:  Recent Labs  06/14/17 1435 06/15/17 0244 06/16/17 0355  WBC 4.0 3.6* 2.9*  HGB 7.6* 7.5* 9.0*  HCT 22.5* 22.3* 26.2*  PLT 100* 101* 127*   BMET  Recent Labs  06/14/17 0518 06/16/17 0355  NA 138 139  K 4.0 3.9  CL 111 111  CO2 22 22  GLUCOSE 87 76  BUN 17 6  CREATININE 1.01 0.71  CALCIUM 8.4* 8.5*   LFT No results for input(s): PROT, ALBUMIN, AST, ALT, ALKPHOS, BILITOT, BILIDIR, IBILI in the last 72 hours. PT/INR No results for input(s): LABPROT, INR in the last 72 hours.  Studies/Results: No results found.     Assessment / Plan:   56 y/o male with HIV admitted with anemia and GI bleeding.  Colonoscopy showed one small polyp which was removed.  EGD with giant duodenal bulb ulcer with erythematous protuberance (vessel) s/p one hemostasis clip placed. Clo test and biopsies pending. Tolerating clear liquids, responded  to PRBC transfusion, no further evidence of bleeding.  I discussed PUD with the patient and his sister. He should avoid all NSAIDs moving forward. Given size of ulcer with endoscopic therapy, would continue IV PPI for 72 hours since the procedure, 40mg  protonix BID. Can advance to full liquid diet today, repeat Hgb in the AM. Await pathology results / Clo test, treat if H pylori positive. Will continue to hold Eliquis for now, and should be held if possible for the next week at least (preferably longer if able to, patient has IVC filter in place?).  We will follow, call with questions.  Lyman Cellar, MD [facility] Pager [phone]

## 2017-06-16 NOTE — Progress Notes (Addendum)
PROGRESS NOTE    [name] Herring  PJK:[id number] DOB: 1960-10-11 DOA: 06/12/2017 PCP: Arnoldo Morale, MD  Brief Narrative:[name] Herring a 56 y.o.malewith medical history significant of hypertension, depression, polysubstance abuse (tobacco, alcohol, cocaine), DVT, PE on eliquis. , HIV/AIDs (CD4 to 50 and viral load 1.477 on 05/30/17), schizophrenia, immunodeficiency disorder, VERY POOR HISTORIAN who presented with chest pain, shortness breath, nausea, vomiting, diarrhea, abdominal pain, rectal bleeding and anemia.   Assessment & Plan:   Large duodenal ulcer with GI bleeding and acute blood loss anemia -Status post 3 units of PRBC since admission -History of NSAID and alcohol abuse -Endoscopy done 9/21 noted very large nonbleeding duodenal ulcer with pigmented tuberence which was clipped and biopsied -Continue PPI, change to full liquid diet -Monitor CBC  Human immunodeficiency virus (HIV) disease ([place]): CD4 =50 and viral load 1.477 on 05/30/17, I question his compliance, per ID this can fluctuate a little in setting of infection -continue home HIV meds -pt is on Bactrim and fluconazole forprophylaxis  Hx of PE and DVT: pt is on Eliquis -Patient reports compliance but this is likely questionable -VQ scan with low probability of PE and history of IVC filte -currently holding Eliquis in setting of GI bleeding -We will discuss with GI regarding resuming Apixaban in 1 week  Chest pain -resolved -troponin negative x3 -EKG abnormal with ST changes, ECHO with EF of 50-55% and normal wall motion  AKI: pt's Cre is 1.73 and BUN 50 on admission. Likely due to prerenal secondary to dehydration and continuation of ACEI, NSAIDs and Bactrim. -cut down IVF, creatinine improved  Protein-calorie malnutrition, severe ([place]): -Nutrition consult -po intake remains poor  Nausea vomiting and diarrhea: -resolved  HTN: -Continue metoprolol -Holding lisinopril  Schizophrenia and  depression: -Continue haldol, Remeron,Valbenzine, Cogentin -will cut down doses of COgentin and haldol due to increased somolence, change to PRN Haldol  H/o Tobacco abuse and Alcohol abuse: -Nicotine patch -no ongoing ETOH use now per sister, no withdrawal noted  Prophylaxis: SCD Code Status: DNR  Family Communication: sister at bedside  Disposition Plan: plan for SNF with Palliative care FU  Consultants:   [facility] GI  Subjective: -No complaints this morning denies any more bleeding  Objective: Vitals:   06/15/17 1725 06/15/17 2010 06/16/17 0439 06/16/17 0925  BP: 111/82 101/78 (!) 100/59   Pulse: 67 65 (!) 57   Resp: 16 16 20    Temp: (!) 97.5 F (36.4 C) 97.9 F (36.6 C) 97.8 F (36.6 C)   TempSrc: Oral Oral Oral   SpO2: 100% 100% 98% 96%  Weight:      Height:        Intake/Output Summary (Last 24 hours) at 06/16/17 1140 Last data filed at 06/16/17 1000  Gross per 24 hour  Intake          1864.17 ml  Output              600 ml  Net          1264.17 ml   Filed Weights   06/12/17 1326 06/13/17 0519  Weight: 53.9 kg (118 lb 13.3 oz) 57.9 kg (127 lb 10.3 oz)    Examination:  Gen: Chronically ill failure African-American male sitting in a chair, no distress HEENT: PERRLA, Neck supple, no JVD Lungs: Good air movement bilaterally, CTAB CVS: RRR,No Gallops,Rubs or new Murmurs Abd: soft, Non tender, non distended, BS present Extremities: No Cyanosis, Clubbing or edema Skin: no new rashes   Data Reviewed:   CBC:  Recent Labs Lab 06/12/17 0326  06/13/17 1922 06/14/17 0509 06/14/17 1435 06/15/17 0244 06/16/17 0355  WBC 11.6*  < > 5.6 4.2 4.0 3.6* 2.9*  NEUTROABS 9.7*  --   --   --   --   --   --   HGB 8.1*  < > 7.8* 7.7* 7.6* 7.5* 9.0*  HCT 23.6*  < > 22.9* 23.0* 22.5* 22.3* 26.2*  MCV 89.1  < > 91.6 92.4 89.6 91.8 88.2  PLT 159  < > 104* 112* 100* 101* 127*  < > = values in this interval not displayed. Basic Metabolic Panel:  Recent Labs Lab  06/12/17 0326 06/13/17 0316 06/14/17 0518 06/16/17 0355  NA 136 142 138 139  K 4.2 4.4 4.0 3.9  CL 105 114* 111 111  CO2 22 22 22 22   GLUCOSE 145* 83 87 76  BUN 50* 28* 17 6  CREATININE 1.73* 1.03 1.01 0.71  CALCIUM 9.1 8.4* 8.4* 8.5*   GFR: Estimated Creatinine Clearance: 85.4 mL/min (by C-G formula based on SCr of 0.71 mg/dL). Liver Function Tests: No results for input(s): AST, ALT, ALKPHOS, BILITOT, PROT, ALBUMIN in the last 168 hours. No results for input(s): LIPASE, AMYLASE in the last 168 hours. No results for input(s): AMMONIA in the last 168 hours. Coagulation Profile:  Recent Labs Lab 06/12/17 0533  INR 1.03   Cardiac Enzymes:  Recent Labs Lab 06/12/17 0326 06/12/17 0533 06/12/17 1322 06/13/17 0316  TROPONINI <0.03 <0.03 <0.03 <0.03   BNP (last 3 results) No results for input(s): PROBNP in the last 8760 hours. HbA1C: No results for input(s): HGBA1C in the last 72 hours. CBG:  Recent Labs Lab 06/12/17 0824 06/13/17 0737 06/14/17 0741 06/15/17 0759 06/16/17 0807  GLUCAP 117* 82 120* 87 87   Lipid Profile: No results for input(s): CHOL, HDL, LDLCALC, TRIG, CHOLHDL, LDLDIRECT in the last 72 hours. Thyroid Function Tests: No results for input(s): TSH, T4TOTAL, FREET4, T3FREE, THYROIDAB in the last 72 hours. Anemia Panel: No results for input(s): VITAMINB12, FOLATE, FERRITIN, TIBC, IRON, RETICCTPCT in the last 72 hours. Urine analysis:    Component Value Date/Time   COLORURINE YELLOW 05/29/2017 1832   APPEARANCEUR CLEAR 05/29/2017 1832   LABSPEC 1.006 05/29/2017 1832   PHURINE 6.0 05/29/2017 1832   GLUCOSEU NEGATIVE 05/29/2017 1832   GLUCOSEU NEG mg/dL 10/11/2006 2016   HGBUR NEGATIVE 05/29/2017 1832   BILIRUBINUR NEGATIVE 05/29/2017 1832   KETONESUR NEGATIVE 05/29/2017 1832   PROTEINUR NEGATIVE 05/29/2017 1832   UROBILINOGEN 1 10/11/2006 2016   NITRITE NEGATIVE 05/29/2017 1832   LEUKOCYTESUR NEGATIVE 05/29/2017 1832   Sepsis  Labs: @LABRCNTIP (procalcitonin:4,lacticidven:4)  ) Recent Results (from the past 240 hour(s))  MRSA PCR Screening     Status: None   Collection Time: 06/12/17  2:35 PM  Result Value Ref Range Status   MRSA by PCR NEGATIVE NEGATIVE Final    Comment:        The GeneXpert MRSA Assay (FDA approved for NASAL specimens only), is one component of a comprehensive MRSA colonization surveillance program. It is not intended to diagnose MRSA infection nor to guide or monitor treatment for MRSA infections.          Radiology Studies: No results found.      Scheduled Meds: . benztropine  0.5 mg Oral Daily   And  . benztropine  1 mg Oral QHS  . dextromethorphan-guaiFENesin  1 tablet Oral BID  . elvitegravir-cobicistat-emtricitabine-tenofovir  1 tablet Oral Q breakfast  . fluconazole  100  mg Oral Weekly  . folic acid  1 mg Oral Daily  . gabapentin  600 mg Oral QHS  . magnesium oxide  400 mg Oral BID  . metoprolol tartrate  25 mg Oral BID  . mirtazapine  15 mg Oral QHS  . multivitamin with minerals  1 tablet Oral Daily  . nicotine  21 mg Transdermal Daily  . pantoprazole  40 mg Intravenous Q12H  . sulfamethoxazole-trimethoprim  1 tablet Oral Daily  . thiamine  100 mg Oral Daily  . tiotropium  18 mcg Inhalation Daily  . Valbenazine Tosylate  80 mg Oral BID   Continuous Infusions:    LOS: 4 days    Time spent: 61min    Domenic Polite, MD [facility] Pager [phone]  If 7PM-7AM, please contact night-coverage [email or web address] Password [email or web address] 06/16/2017, 11:40 AM

## 2017-06-17 LAB — CBC
HCT: 25.6 % — ABNORMAL LOW (ref 39.0–52.0)
Hemoglobin: 8.8 g/dL — ABNORMAL LOW (ref 13.0–17.0)
MCH: 30.6 pg (ref 26.0–34.0)
MCHC: 34.4 g/dL (ref 30.0–36.0)
MCV: 88.9 fL (ref 78.0–100.0)
PLATELETS: 138 10*3/uL — AB (ref 150–400)
RBC: 2.88 MIL/uL — AB (ref 4.22–5.81)
RDW: 19 % — ABNORMAL HIGH (ref 11.5–15.5)
WBC: 3.9 10*3/uL — ABNORMAL LOW (ref 4.0–10.5)

## 2017-06-17 LAB — GLUCOSE, CAPILLARY: Glucose-Capillary: 152 mg/dL — ABNORMAL HIGH (ref 65–99)

## 2017-06-17 MED ORDER — PANTOPRAZOLE SODIUM 40 MG PO TBEC
40.0000 mg | DELAYED_RELEASE_TABLET | Freq: Two times a day (BID) | ORAL | Status: DC
  Administered 2017-06-18: 40 mg via ORAL
  Filled 2017-06-17: qty 1

## 2017-06-17 MED ORDER — PANTOPRAZOLE SODIUM 40 MG IV SOLR
40.0000 mg | Freq: Two times a day (BID) | INTRAVENOUS | Status: AC
  Administered 2017-06-17: 40 mg via INTRAVENOUS
  Filled 2017-06-17: qty 40

## 2017-06-17 NOTE — Clinical Social Work Note (Signed)
Clinical Social Work Assessment  Patient Details  Name: [name] Herring MRN: [id number] Date of Birth: 1961/08/20  Date of referral:  06/17/17               Reason for consult:  Facility Placement                Permission sought to share information with:  Facility Art therapist granted to share information::  No  Name::        Agency::  Declined SNF  Relationship::     Contact Information:     Housing/Transportation Living arrangements for the past 2 months:  Apartment Source of Information:  Patient Patient Interpreter Needed:  None Criminal Activity/Legal Involvement Pertinent to Current Situation/Hospitalization:  No - Comment as needed Significant Relationships:  Siblings Lives with:  Self Do you feel safe going back to the place where you live?  Yes Need for family participation in patient care:  No (Coment)  Care giving concerns:  Pt from alone. Pt indicates that he is well enough to go home. Pt indicated that the takes care of his needs at home. Pt indicated that he independent with ADL's. Pt confirmed that he is compliant with mental health treatment. Pt states that he is connected to [facility] and confirms that his medications are managed at home. He DECLINES SNF at this time. CSW listened and validated his feelings. CSW encouraged him to consider again prior to discharge to ensure that we provide the best chance for him to improve. Pt wold consider and still DECLINED.  Social Worker assessment / plan:  Pt declined SNF and indicted that he wants to return home. No further CSW plan at this time.  Employment status:  Disabled (Comment on whether or not currently receiving Disability) Insurance information:  Medicare PT Recommendations:  [place] / Referral to community resources:  [place]  Patient/Family's Response to care:  Patient appreciative of CSW coming to meet with him, however, declined  SNF.  Patient/Family's Understanding of and Emotional Response to Diagnosis, Current Treatment, and Prognosis:  Pt has good understanding of diagnosis, current treatment and prognosis. Pt desires to return home and feels that he can manage his health at home. No issues or concerns identified.  Emotional Assessment Appearance:  Appears older than stated age Attitude/Demeanor/Rapport:   (Cooperative, Friendly) Affect (typically observed):  Accepting, Appropriate Orientation:  Oriented to Self, Oriented to Place, Oriented to  Time, Oriented to Situation Alcohol / Substance use:  Not Applicable Psych involvement (Current and /or in the community):     Discharge Needs  Concerns to be addressed:  Care Coordination Readmission within the last 30 days:  No Current discharge risk:  Physical Impairment, Chronically ill Barriers to Discharge:  No Barriers Identified   Normajean Baxter, LCSW 06/17/2017, 11:46 AM

## 2017-06-17 NOTE — Progress Notes (Signed)
PROGRESS NOTE    QUANTRELL SPLITT  CLE:[id number] DOB: [birth year] DOA: 06/12/2017 PCP: Arnoldo Morale, MD  Brief Narrative:Nino Garald Braver a 56 y.o.malewith medical history significant of hypertension, depression, polysubstance abuse (tobacco, alcohol, cocaine), DVT, PE on eliquis. , HIV/AIDs (CD4 to 50 and viral load 1.477 on 05/30/17), schizophrenia, immunodeficiency disorder, VERY POOR HISTORIAN who presented with chest pain, shortness breath, nausea, vomiting, diarrhea, abdominal pain, rectal bleeding and anemia. Endoscopy showed large duodenal ulcer, status post 3 units of blood this admission  Assessment & Plan:   Large duodenal ulcer with GI bleeding and acute blood loss anemia -Status post 3 units of PRBC since admission -History of NSAID and alcohol abuse -Endoscopy done 9/21 noted very large nonbleeding duodenal ulcer with pigmented tuberence which was clipped and biopsied -Continue PPI, change to full liquid diet -Monitor CBC -Advance to soft diet  Human immunodeficiency virus (HIV) disease ([place]): CD4 =50 and viral load 1.477 on 05/30/17, I question his compliance, per ID this can fluctuate a little in setting of infection -continue home HIV meds -pt is on Bactrim and fluconazole forprophylaxis  Hx of PE and DVT: pt is on Eliquis -Patient reports compliance but this is likely questionable -VQ scan with low probability of PE and history of IVC filte -currently holding Eliquis in setting of GI bleeding -per GI hold Eliquis for 1-2weeks  Chest pain -resolved -troponin negative x3 -EKG abnormal with ST changes, ECHO with EF of 50-55% and normal wall motion  AKI: pt's Cre is 1.73 and BUN 50 on admission. Likely due to prerenal secondary to dehydration and continuation of ACEI, NSAIDs and Bactrim. -cut down IVF, creatinine improved  Protein-calorie malnutrition, severe ([place]): -Nutrition consult -po intake remains poor  Nausea vomiting and  diarrhea: -resolved  HTN: -Continue metoprolol -Holding lisinopril  Schizophrenia and depression: -Continue haldol, Remeron,Valbenzine, Cogentin -will cut down doses of COgentin and haldol due to increased somolence, change to PRN Haldol  H/o Tobacco abuse and Alcohol abuse: -Nicotine patch -no ongoing ETOH use now per sister, no withdrawal noted  Prophylaxis: SCD Code Status: DNR  Family Communication: sister at bedside yesterday Disposition Plan:  SNF tomorrow stable  Consultants:   [facility] GI  Subjective: -50 okay this morning denies any more bleeding, ready to try solid food, no nausea or vomiting  Objective: Vitals:   06/16/17 1317 06/16/17 1357 06/16/17 2132 06/17/17 0600  BP: (!) 80/48 (!) 80/58 119/87 103/77  Pulse:   62 (!) 59  Resp:   20 18  Temp:   97.6 F (36.4 C) 97.8 F (36.6 C)  TempSrc:   Oral Oral  SpO2:   97% 99%  Weight:      Height:        Intake/Output Summary (Last 24 hours) at 06/17/17 1126 Last data filed at 06/17/17 1055  Gross per 24 hour  Intake              480 ml  Output              200 ml  Net              280 ml   Filed Weights   06/12/17 1326 06/13/17 0519  Weight: 53.9 kg (118 lb 13.3 oz) 57.9 kg (127 lb 10.3 oz)    Examination:  Gen: frail, chronically African-American maleSitting in a recliner, no distress HEENT: PERRLA, Neck supple, no JVD Lungs: Good air movement bilaterally, CTAB CVS: RRR,No Gallops,Rubs or new Murmurs Abd: soft, Non tender, non  distended, BS present Extremities: No Cyanosis, Clubbing or edema Skin: no new rashes   Data Reviewed:   CBC:  Recent Labs Lab 06/12/17 0326  06/14/17 0509 06/14/17 1435 06/15/17 0244 06/16/17 0355 06/17/17 0938  WBC 11.6*  < > 4.2 4.0 3.6* 2.9* 3.9*  NEUTROABS 9.7*  --   --   --   --   --   --   HGB 8.1*  < > 7.7* 7.6* 7.5* 9.0* 8.8*  HCT 23.6*  < > 23.0* 22.5* 22.3* 26.2* 25.6*  MCV 89.1  < > 92.4 89.6 91.8 88.2 88.9  PLT 159  < > 112* 100* 101* 127*  138*  < > = values in this interval not displayed. Basic Metabolic Panel:  Recent Labs Lab 06/12/17 0326 06/13/17 0316 06/14/17 0518 06/16/17 0355  NA 136 142 138 139  K 4.2 4.4 4.0 3.9  CL 105 114* 111 111  CO2 22 22 22 22   GLUCOSE 145* 83 87 76  BUN 50* 28* 17 6  CREATININE 1.73* 1.03 1.01 0.71  CALCIUM 9.1 8.4* 8.4* 8.5*   GFR: Estimated Creatinine Clearance: 85.4 mL/min (by C-G formula based on SCr of 0.71 mg/dL). Liver Function Tests: No results for input(s): AST, ALT, ALKPHOS, BILITOT, PROT, ALBUMIN in the last 168 hours. No results for input(s): LIPASE, AMYLASE in the last 168 hours. No results for input(s): AMMONIA in the last 168 hours. Coagulation Profile:  Recent Labs Lab 06/12/17 0533  INR 1.03   Cardiac Enzymes:  Recent Labs Lab 06/12/17 0326 06/12/17 0533 06/12/17 1322 06/13/17 0316  TROPONINI <0.03 <0.03 <0.03 <0.03   BNP (last 3 results) No results for input(s): PROBNP in the last 8760 hours. HbA1C: No results for input(s): HGBA1C in the last 72 hours. CBG:  Recent Labs Lab 06/13/17 0737 06/14/17 0741 06/15/17 0759 06/16/17 0807 06/17/17 0822  GLUCAP 82 120* 87 87 152*   Lipid Profile: No results for input(s): CHOL, HDL, LDLCALC, TRIG, CHOLHDL, LDLDIRECT in the last 72 hours. Thyroid Function Tests: No results for input(s): TSH, T4TOTAL, FREET4, T3FREE, THYROIDAB in the last 72 hours. Anemia Panel: No results for input(s): VITAMINB12, FOLATE, FERRITIN, TIBC, IRON, RETICCTPCT in the last 72 hours. Urine analysis:    Component Value Date/Time   COLORURINE YELLOW 05/29/2017 1832   APPEARANCEUR CLEAR 05/29/2017 1832   LABSPEC 1.006 05/29/2017 1832   PHURINE 6.0 05/29/2017 1832   GLUCOSEU NEGATIVE 05/29/2017 1832   GLUCOSEU NEG mg/dL 10/11/2006 2016   HGBUR NEGATIVE 05/29/2017 1832   BILIRUBINUR NEGATIVE 05/29/2017 1832   KETONESUR NEGATIVE 05/29/2017 1832   PROTEINUR NEGATIVE 05/29/2017 1832   UROBILINOGEN 1 10/11/2006 2016    NITRITE NEGATIVE 05/29/2017 1832   LEUKOCYTESUR NEGATIVE 05/29/2017 1832   Sepsis Labs: @LABRCNTIP (procalcitonin:4,lacticidven:4)  ) Recent Results (from the past 240 hour(s))  MRSA PCR Screening     Status: None   Collection Time: 06/12/17  2:35 PM  Result Value Ref Range Status   MRSA by PCR NEGATIVE NEGATIVE Final    Comment:        The GeneXpert MRSA Assay (FDA approved for NASAL specimens only), is one component of a comprehensive MRSA colonization surveillance program. It is not intended to diagnose MRSA infection nor to guide or monitor treatment for MRSA infections.          Radiology Studies: No results found.      Scheduled Meds: . benztropine  0.5 mg Oral Daily   And  . benztropine  1 mg Oral QHS  .  dextromethorphan-guaiFENesin  1 tablet Oral BID  . elvitegravir-cobicistat-emtricitabine-tenofovir  1 tablet Oral Q breakfast  . fluconazole  100 mg Oral Weekly  . folic acid  1 mg Oral Daily  . gabapentin  600 mg Oral QHS  . magnesium oxide  400 mg Oral BID  . metoprolol tartrate  25 mg Oral BID  . mirtazapine  15 mg Oral QHS  . multivitamin with minerals  1 tablet Oral Daily  . nicotine  21 mg Transdermal Daily  . pantoprazole  40 mg Intravenous Q12H  . sulfamethoxazole-trimethoprim  1 tablet Oral Daily  . thiamine  100 mg Oral Daily  . tiotropium  18 mcg Inhalation Daily  . Valbenazine Tosylate  80 mg Oral BID   Continuous Infusions:    LOS: 5 days    Time spent: 40min    Domenic Polite, MD [facility] Pager [phone]  If 7PM-7AM, please contact night-coverage [email or web address] Password [facility] 06/17/2017, 11:26 AM

## 2017-06-17 NOTE — Progress Notes (Signed)
      Progress Note   Subjective  Patient tolerating full liquids. No abdominal pain. No vomiting, no recurrent bleeding. Hgb stable.    Objective   Vital signs in last 24 hours: Temp:  [97.6 F (36.4 C)-98.3 F (36.8 C)] 97.8 F (36.6 C) (09/23 0600) Pulse Rate:  [59-70] 59 (09/23 0600) Resp:  [16-20] 18 (09/23 0600) BP: (76-119)/(48-87) 103/77 (09/23 0600) SpO2:  [96 %-99 %] 99 % (09/23 0600) Last BM Date: 06/17/17 General:    AA male in NAD Heart:  Regular rate and rhythm; no murmurs Lungs: Respirations even and unlabored, lungs CTA bilaterally Abdomen:  Soft, nontender and nondistended. Normal bowel sounds. Extremities:  Without edema. Neurologic:  Alert and oriented,  grossly normal neurologically. Psych:  Cooperative. Normal mood and affect.  Intake/Output from previous day: 09/22 0701 - 09/23 0700 In: 1024.2 [P.O.:960; I.V.:64.2] Out: 0  Intake/Output this shift: Total I/O In: -  Out: 200 [Urine:200]  Lab Results:  Recent Labs  06/15/17 0244 06/16/17 0355 06/17/17 0938  WBC 3.6* 2.9* 3.9*  HGB 7.5* 9.0* 8.8*  HCT 22.3* 26.2* 25.6*  PLT 101* 127* 138*   BMET  Recent Labs  06/16/17 0355  NA 139  K 3.9  CL 111  CO2 22  GLUCOSE 76  BUN 6  CREATININE 0.71  CALCIUM 8.5*   LFT No results for input(s): PROT, ALBUMIN, AST, ALT, ALKPHOS, BILITOT, BILIDIR, IBILI in the last 72 hours. PT/INR No results for input(s): LABPROT, INR in the last 72 hours.  Studies/Results: No results found.     Assessment / Plan:   56 y/o male with HIV admitted with anemia and GI bleeding.  Colonoscopy showed one small polyp which was removed.  EGD with giant duodenal bulb ulcer with erythematous protuberance (vessel) s/p one hemostasis clip placed. Clo test returned as NEGATIVE, thus the ulcer was likely related to NSAID use.Tolerating liquids, Hgb stable, no further bleeding.  Given size of ulcer with endoscopic therapy, would continue IV PPI for 72 hours since  the procedure (continue through today), and then transition to 40mg  protonix PO BID for another month, and then once daily thereafter. Can advance to soft diet today, and then regular diet tomorrow if tolerated. Continue to hold Eliquis for now, and I would ideally hold it 2 weeks if possible (if the patient does have IVC filter), if no filter and you feel he is high risk for recurrent clot, would hold for the next week.  He should avoid all NSAIDs moving forward.   We will sign off for now, please call with questions moving forward.  Wagoner Cellar, MD [facility] Pager [phone]

## 2017-06-18 ENCOUNTER — Encounter (HOSPITAL_COMMUNITY): Payer: Self-pay | Admitting: Internal Medicine

## 2017-06-18 LAB — TYPE AND SCREEN
ABO/RH(D): A POS
Antibody Screen: NEGATIVE
Unit division: 0
Unit division: 0
Unit division: 0

## 2017-06-18 LAB — GLUCOSE, CAPILLARY: Glucose-Capillary: 117 mg/dL — ABNORMAL HIGH (ref 65–99)

## 2017-06-18 LAB — BPAM RBC
BLOOD PRODUCT EXPIRATION DATE: 201810072359
Blood Product Expiration Date: 201809282359
Blood Product Expiration Date: 201810072359
ISSUE DATE / TIME: 201809181733
ISSUE DATE / TIME: 201809211659
ISSUE DATE / TIME: 201809181401
Unit Type and Rh: 600
Unit Type and Rh: 6200
Unit Type and Rh: 6200

## 2017-06-18 LAB — CBC
HEMATOCRIT: 29.1 % — AB (ref 39.0–52.0)
Hemoglobin: 9.7 g/dL — ABNORMAL LOW (ref 13.0–17.0)
MCH: 29.8 pg (ref 26.0–34.0)
MCHC: 33.3 g/dL (ref 30.0–36.0)
MCV: 89.3 fL (ref 78.0–100.0)
Platelets: 164 10*3/uL (ref 150–400)
RBC: 3.26 MIL/uL — ABNORMAL LOW (ref 4.22–5.81)
RDW: 19.2 % — AB (ref 11.5–15.5)
WBC: 4.3 10*3/uL (ref 4.0–10.5)

## 2017-06-18 MED ORDER — HALOPERIDOL 10 MG PO TABS: 5.0000 mg | ORAL_TABLET | Freq: Two times a day (BID) | ORAL | Status: DC

## 2017-06-18 MED ORDER — APIXABAN 2.5 MG PO TABS: 2.5000 mg | ORAL_TABLET | Freq: Two times a day (BID) | ORAL | 0 refills | Status: DC

## 2017-06-18 MED ORDER — PANTOPRAZOLE SODIUM 40 MG PO TBEC: 40.0000 mg | DELAYED_RELEASE_TABLET | Freq: Two times a day (BID) | ORAL | 0 refills | Status: DC

## 2017-06-18 NOTE — Evaluation (Signed)
Occupational Therapy Evaluation Patient Details Name: [name] Herring MRN: [id number] DOB: Nov 28, 1960 Today's Date: 06/18/2017    History of Present Illness 56 y.o. male with medical history significant of hypertension, depression, polysubstance abuse (tobacco, alcohol, cocaine), DVT, PE on eliquis. , HIV/AIDs (CD4 to 50 and viral load 1.477 on 05/30/17), schizophrenia, and admitted with Large duodenal ulcer with GI bleeding and acute blood loss anemia   Clinical Impression   Pt admitted with GI bleeding. Pt currently with functional limitations due to the deficits listed below (see OT Problem List).  Pt will benefit from skilled OT to increase their safety and independence with ADL and functional mobility for ADL to facilitate discharge to venue listed below.      Follow Up Recommendations  SNF;Supervision/Assistance - 24 hour;Home health OT    Equipment Recommendations  None recommended by OT    Recommendations for Other Services       Precautions / Restrictions Precautions Precautions: Fall Restrictions Weight Bearing Restrictions: No      Mobility Bed Mobility Overal bed mobility: Modified Independent                Transfers Overall transfer level: Needs assistance Equipment used: None Transfers: Sit to/from Stand;Stand Pivot Transfers Sit to Stand: Min guard Stand pivot transfers: Min guard       General transfer comment: min/guard for safety    Balance Overall balance assessment: Needs assistance         Standing balance support: No upper extremity supported Standing balance-Leahy Scale: Fair                             ADL either performed or assessed with clinical judgement   ADL Overall ADL's : Needs assistance/impaired Eating/Feeding: Set up;Sitting   Grooming: Set up;Sitting   Upper Body Bathing: Set up;Sitting   Lower Body Bathing: Minimal assistance;Sit to/from stand;Cueing for safety;Cueing for sequencing   Upper Body  Dressing : Set up;Sitting   Lower Body Dressing: Minimal assistance;Sit to/from stand;Cueing for safety;Cueing for sequencing   Toilet Transfer: Min guard;Ambulation;Comfort height toilet   Toileting- Clothing Manipulation and Hygiene: Min guard;Sit to/from stand       Functional mobility during ADLs: Min guard;Cueing for safety;Cueing for sequencing       Vision Patient Visual Report: No change from baseline       Perception     Praxis      Pertinent Vitals/Pain Pain Assessment: No/denies pain        Extremity/Trunk Assessment Upper Extremity Assessment Upper Extremity Assessment: Generalized weakness           Communication Communication Communication: No difficulties   Cognition Arousal/Alertness: Awake/alert Behavior During Therapy: WFL for tasks assessed/performed Overall Cognitive Status: Within Functional Limits for tasks assessed                                 General Comments: poor safety awareness regarding need for A at home   General Comments       Exercises     Shoulder Instructions      Home Living Family/patient expects to be discharged to:: Private residence Living Arrangements: Alone   Type of Home: Apartment Home Access: Stairs to enter [employer] of Steps: 2nd floor apt Entrance Stairs-Rails: Right Home Layout: One level               Home  Equipment: None          Prior Functioning/Environment Level of Independence: Independent                 OT Problem List: Decreased strength;Impaired balance (sitting and/or standing);Decreased activity tolerance;Decreased knowledge of use of DME or AE      OT Treatment/Interventions: Self-care/ADL training;Patient/family education;DME and/or AE instruction;Therapeutic activities    OT Goals(Current goals can be found in the care plan section) Acute Rehab OT Goals Patient Stated Goal: home today OT Goal Formulation: With patient Time For Goal  Achievement: 07/02/17 Potential to Achieve Goals: Good  OT Frequency: Min 2X/week   Barriers to D/C: Decreased caregiver support             AM-PAC PT "6 Clicks" Daily Activity     Outcome Measure Help from another person eating meals?: None Help from another person taking care of personal grooming?: None Help from another person toileting, which includes using toliet, bedpan, or urinal?: A Little Help from another person bathing (including washing, rinsing, drying)?: A Little Help from another person to put on and taking off regular upper body clothing?: None Help from another person to put on and taking off regular lower body clothing?: A Little 6 Click Score: 21   End of Session Nurse Communication: Mobility status  Activity Tolerance: Patient tolerated treatment well Patient left: in chair;with call bell/phone within reach;with bed alarm set  OT Visit Diagnosis: Unsteadiness on feet (R26.81);Muscle weakness (generalized) (M62.81);History of falling (Z91.81)                Time: 5631-4970 OT Time Calculation (min): 24 min Charges:  OT General Charges $OT Visit: 1 Visit OT Evaluation $OT Eval Moderate Complexity: 1 Mod OT Treatments $Self Care/Home Management : 8-22 mins G-Codes:     Kari Baars, OT [phone]  Payton Mccallum D 06/18/2017, 11:50 AM

## 2017-06-18 NOTE — Progress Notes (Signed)
Pt had no preference for HH. Referral given to [facility] in house rep.

## 2017-06-18 NOTE — Discharge Summary (Signed)
Physician Discharge Summary  [name] Herring OZH:[id number] DOB: 1961/03/15 DOA: 06/12/2017  PCP: Arnoldo Morale, MD  Admit date: 06/12/2017 Discharge date: 06/18/2017  Time spent: 33minutes  Recommendations for Outpatient Follow-up:  1. Declined SNF despite much persuasion, home health services set up at discharge 2.   FU with ID in [facility] 3. Restart Eliquis on 10/5 4. FU with [facility] in 1 month, FU Duodenal biopsy   Discharge Diagnoses:  Principal Problem:   GIB (gastrointestinal bleeding)   Large Duodenal ulcer   Human immunodeficiency virus (HIV) disease ([place])   Depression   Alcohol use disorder, severe, dependence ([place])   Paranoid schizophrenia, chronic condition ([place])   HTN (hypertension)   Tobacco abuse   PE (pulmonary thromboembolism) ([place])   DVT of lower extremity, bilateral ([place])   AKI (acute kidney injury) ([place])   Protein-calorie malnutrition, severe ([place])   Chest pain   Nausea vomiting and diarrhea   Encounter for palliative care   Goals of care, counseling/discussion   Hematochezia   Acute blood loss anemia   Coagulopathy ([facility])   Abnormal CT of the abdomen   Unintentional weight loss   Benign neoplasm of transverse colon   Duodenal ulcer with hemorrhage   Discharge Condition: stable  Diet recommendation: soft diet  Filed Weights   06/12/17 1326 06/13/17 0519  Weight: 53.9 kg (118 lb 13.3 oz) 57.9 kg (127 lb 10.3 oz)    History of present illness:  [name] Herring a 56 y.o.malewith medical history significant of hypertension, depression, polysubstance abuse (tobacco, alcohol, cocaine), DVT, PE on eliquis. , HIV/AIDs (CD4 to 50 and viral load 1.477 on 05/30/17), schizophrenia, immunodeficiency disorder, VERY POOR HISTORIAN who presented with chest pain, shortness breath, nausea, vomiting, diarrhea, abdominal pain, rectal bleeding and anemia.  Hospital Course:  Large duodenal ulcer with GI bleeding and acute blood loss anemia -Status post 3 units  of PRBC since admission -History of NSAID and alcohol abuse -Endoscopy done 9/21 noted very large nonbleeding duodenal ulcer with pigmented tuberence which was clipped and biopsied, results pending -continue PPI at discharge, diet advanced -physically quite debilitated and needs Rehab and according to sister and other family he is unable to take care of himself but he still refuses SNF or any placement -Home health set up at [facility]  Human immunodeficiency virus (HIV) disease ([place]): CD4 =50 and viral load 1.477 on 05/30/17, I question his compliance, per ID this can fluctuate a little in setting of infection -continue home HIV meds -pt is on Bactrim and fluconazole forprophylaxis -FU with ID in [place]  Hx of PE and DVT: pt is on Eliquis -Patient reports compliance but this is likely questionable -VQ scan with low probability of PE and history of IVC filte -currently held Eliquis in setting of GI bleeding -per GI hold Eliquis for 2weeks, can resume on 10/5  Chest pain -resolved -troponin negative x3 -EKG abnormal with ST changes, ECHO with EF of 50-55% and normal wall motion  AKI: pt's Cre is 1.73 and BUN 50 on admission. Likely due to prerenal secondary to dehydration and continuation of ACEI, NSAIDs and Bactrim. -cut down IVF, creatinine improved  Protein-calorie malnutrition, severe ([place]): -Nutrition consulted, supplements advised -po intake remains poor  Nausea vomiting and diarrhea: -resolved  HTN: -Continue metoprolol -resumed lisinopril  Schizophrenia and depression: -Continue haldol, Remeron,Valbenzine, Cogentin -cut down doses of COgentin and haldol doses, poor compliance with these meds at home per sister  H/o Tobacco abuse and Alcohol abuse: -Nicotine patch -no  ongoing ETOH use now per sister, no withdrawal noted   Procedures: EGD: Impression:               - Normal esophagus.                           - Normal stomach, saved tiny prepyloric ulcers.                            - One non-bleeding GIANT duodenal ulcer with                            pigmented protuberance which was clipped. Biopsies                            for Helicobacter pylori taken                            - Normal post bulbar duodenum.  Consultations:  GI Dr.Perry  ID Megan Salon  Discharge Exam: Vitals:   06/18/17 0508 06/18/17 0906  BP: 140/88   Pulse: (!) 55   Resp: 16   Temp: 98 F (36.7 C)   SpO2: 94% 96%    General: AAOx3 Cardiovascular: S1S2/RRR Respiratory: CTAB  Discharge Instructions   Discharge Instructions    Diet - low sodium heart healthy    Complete by:  As directed    Increase activity slowly    Complete by:  As directed      Discharge Medication List as of 06/18/2017 12:17 PM    START taking these medications   Details  pantoprazole (PROTONIX) 40 MG tablet Take 1 tablet (40 mg total) by mouth 2 (two) times daily before a meal., Starting Mon 06/18/2017, Print      CONTINUE these medications which have CHANGED   Details  apixaban (ELIQUIS) 2.5 MG TABS tablet Take 1 tablet (2.5 mg total) by mouth 2 (two) times daily. Restart on 10/5, Starting Fri 06/29/2017, No Print    haloperidol (HALDOL) 10 MG tablet Take 0.5 tablets (5 mg total) by mouth 2 (two) times daily., Starting Mon 06/18/2017, No Print      CONTINUE these medications which have NOT CHANGED   Details  acetaminophen (TYLENOL) 500 MG tablet Take 1 tablet (500 mg total) by mouth every 6 (six) hours as needed for moderate pain., Starting Tue 11/07/2016, Print    benztropine (COGENTIN) 1 MG tablet 1 tab in the morning 2 tabs at night, Print    elvitegravir-cobicistat-emtricitabine-tenofovir (GENVOYA) 150-150-200-10 MG TABS tablet TAKE 1 TABLET BY MOUTH DAILY WITH BREAKFAST. *CALL [phone] to see Dr Johnnye Sima ASAP*, Normal    fluconazole (DIFLUCAN) 100 MG tablet Take 1 tablet (100 mg total) by mouth once a week., Starting Thu 05/31/2017, Until Thu 12/27/2017, Normal     gabapentin (NEURONTIN) 600 MG tablet Take 600 mg by mouth at bedtime., Historical Med    magnesium oxide (MAG-OX) 400 (241.3 Mg) MG tablet Take 1 tablet (400 mg total) by mouth 2 (two) times daily., Starting Tue 11/07/2016, Print    metoprolol tartrate (LOPRESSOR) 25 MG tablet Take 1 tablet (25 mg total) by mouth 2 (two) times daily., Starting Tue 03/20/2017, Normal    mirtazapine (REMERON) 15 MG tablet Take 15 mg by mouth at bedtime., Historical Med    Multiple Vitamin (  MULTIVITAMIN WITH MINERALS) TABS tablet Take 1 tablet by mouth daily., Starting Wed 11/08/2016, OTC    ondansetron (ZOFRAN) 4 MG tablet Take 1 tablet (4 mg total) by mouth every 6 (six) hours., Starting Thu 03/22/2017, Print    sulfamethoxazole-trimethoprim (BACTRIM DS) 800-160 MG tablet Take 1 tablet by mouth daily., Starting Thu 05/31/2017, Normal    tiotropium (SPIRIVA) 18 MCG inhalation capsule Place 1 capsule (18 mcg total) into inhaler and inhale daily., Starting Wed 11/08/2016, Print    Valbenazine Tosylate (INGREZZA) 80 MG CAPS Take 80 mg by mouth 2 (two) times daily. , Historical Med      STOP taking these medications     ibuprofen (ADVIL,MOTRIN) 200 MG tablet      lisinopril (PRINIVIL,ZESTRIL) 10 MG tablet      methocarbamol (ROBAXIN-750) 750 MG tablet      potassium chloride SA (K-DUR,KLOR-CON) 20 MEQ tablet        Allergies  Allergen Reactions  . Amoxicillin Shortness Of Breath and Rash    Has patient had a PCN reaction causing immediate rash, facial/tongue/throat swelling, [name] Has patient had a PCN reaction causing severe rash involving mucus membranes or skin necrosis: no Has patient had a PCN reaction that required hospitalization: yes drs office visit Has patient had a PCN reaction occurring within the last 10 years: no If all of the above answers are "NO", then may proceed with Cephalosporin use.   . Latex Shortness Of Breath   Follow-up Information     Arnoldo Morale, MD. Schedule an appointment as soon as possible for a visit in 1 week(s).   Specialty:  Family Medicine Contact information: [place] [address] [phone]        Napoleon Form, NP. Schedule an appointment as soon as possible for a visit in 1 month(s).   Specialty:  Nurse Practitioner Contact information: [facility] [place]  [address] [phone]            The results of significant diagnostics from this hospitalization (including imaging, microbiology, ancillary and laboratory) are listed below for reference.    Significant Diagnostic Studies: Dg Chest 2 View  Result Date: 06/12/2017 CLINICAL DATA:  Chest pain and shortness of breath.  Fall. EXAM: CHEST  2 VIEW COMPARISON:  05/29/2017 FINDINGS: Normal heart size and pulmonary vascularity. No focal airspace disease or consolidation in the lungs. No blunting of costophrenic angles. No pneumothorax. Mediastinal contours appear intact. IMPRESSION: No active cardiopulmonary disease. Electronically Signed   By: Lucienne Capers M.D.   On: 06/12/2017 03:31   Dg Chest 2 View  Result Date: 05/29/2017 CLINICAL DATA:  Shortness of breath, cough, nausea, vomiting, and diaphoresis for 5 days. EXAM: CHEST  2 VIEW COMPARISON:  12/02/2016 FINDINGS: The cardiac silhouette appears mildly enlarged, accentuated by AP projection. The lungs are slightly less well inflated than on the prior study, and there is mild opacity in the right greater than left lung bases. No edema, pleural effusion, or pneumothorax is identified. Underlying emphysematous changes are again noted in the upper lobes. No acute osseous abnormality is seen. IMPRESSION: Mild bibasilar opacity, likely atelectasis. Electronically Signed   By: Logan Bores M.D.   On: 05/29/2017 18:02   Ct Head Wo Contrast  Result Date: 06/12/2017 CLINICAL DATA:  Fall with head trauma EXAM: CT HEAD WITHOUT CONTRAST CT CERVICAL SPINE  WITHOUT CONTRAST TECHNIQUE: Multidetector CT imaging of the head and cervical spine was performed following the standard protocol  without intravenous contrast. Multiplanar CT image reconstructions of the cervical spine were also generated. COMPARISON:  Brain MRI 12/02/2016 FINDINGS: CT HEAD FINDINGS Brain: No mass lesion, intraparenchymal hemorrhage or extra-axial collection. No evidence of acute cortical infarct. There is periventricular hypoattenuation compatible with chronic microvascular disease. Volume loss is greater than expected for age. Vascular: No hyperdense vessel or unexpected calcification. Skull: Normal visualized skull base, calvarium and extracranial soft tissues. Sinuses/Orbits: No sinus fluid levels or advanced mucosal thickening. No mastoid effusion. Normal orbits. CT CERVICAL SPINE FINDINGS Alignment: No static subluxation. Facets are aligned. Occipital condyles are normally positioned. Skull base and vertebrae: No acute fracture. Soft tissues and spinal canal: No prevertebral fluid or swelling. No visible canal hematoma. Disc levels: Degenerative disease is greatest at C4-C5. No bony spinal canal stenosis. Upper chest: Biapical emphysema. Other: Normal visualized paraspinal cervical soft tissues. IMPRESSION: 1. Age advanced atrophy the and findings of chronic microvascular ischemia. 2. No fracture or static subluxation of the cervical spine. 3. Biapical Emphysema (ICD10-J43.9). Electronically Signed   By: Ulyses Jarred M.D.   On: 06/12/2017 03:34   Ct Cervical Spine Wo Contrast  Result Date: 06/12/2017 CLINICAL DATA:  Fall with head trauma EXAM: CT HEAD WITHOUT CONTRAST CT CERVICAL SPINE WITHOUT CONTRAST TECHNIQUE: Multidetector CT imaging of the head and cervical spine was performed following the standard protocol without intravenous contrast. Multiplanar CT image reconstructions of the cervical spine were also generated. COMPARISON:  Brain MRI 12/02/2016 FINDINGS: CT HEAD FINDINGS Brain:  No mass lesion, intraparenchymal hemorrhage or extra-axial collection. No evidence of acute cortical infarct. There is periventricular hypoattenuation compatible with chronic microvascular disease. Volume loss is greater than expected for age. Vascular: No hyperdense vessel or unexpected calcification. Skull: Normal visualized skull base, calvarium and extracranial soft tissues. Sinuses/Orbits: No sinus fluid levels or advanced mucosal thickening. No mastoid effusion. Normal orbits. CT CERVICAL SPINE FINDINGS Alignment: No static subluxation. Facets are aligned. Occipital condyles are normally positioned. Skull base and vertebrae: No acute fracture. Soft tissues and spinal canal: No prevertebral fluid or swelling. No visible canal hematoma. Disc levels: Degenerative disease is greatest at C4-C5. No bony spinal canal stenosis. Upper chest: Biapical emphysema. Other: Normal visualized paraspinal cervical soft tissues. IMPRESSION: 1. Age advanced atrophy the and findings of chronic microvascular ischemia. 2. No fracture or static subluxation of the cervical spine. 3. Biapical Emphysema (ICD10-J43.9). Electronically Signed   By: Ulyses Jarred M.D.   On: 06/12/2017 03:34   Nm Pulmonary Perf And Vent  Result Date: 06/12/2017 CLINICAL DATA:  Shortness of breath, chest pain. EXAM: NUCLEAR MEDICINE VENTILATION - PERFUSION LUNG SCAN TECHNIQUE: Ventilation images were obtained in multiple projections using inhaled aerosol Tc-75m DTPA. Perfusion images were obtained in multiple projections after intravenous injection of Tc-85m MAA. RADIOPHARMACEUTICALS:  28.9 mCi Technetium-34m DTPA aerosol inhalation and 4.8 mCi Technetium-71m MAA IV COMPARISON:  Chest x-ray 06/12/2017 FINDINGS: Ventilation: Patchy diffuse nonsegmental ventilation defects. Perfusion: Matching patchy diffuse nonsegmental perfusion defects. IMPRESSION: Diffuse nonsegmental ventilation and perfusion defects throughout the lungs, most compatible with  obstructive lung disease. Low probability for pulmonary embolus. Electronically Signed   By: Rolm Baptise M.D.   On: 06/12/2017 12:10    Microbiology: Recent Results (from the past 240 hour(s))  MRSA PCR Screening     Status: None   Collection Time: 06/12/17  2:35 PM  Result Value Ref Range Status   MRSA by PCR NEGATIVE NEGATIVE Final    Comment:        The GeneXpert MRSA Assay (FDA  approved for NASAL specimens only), is one component of a comprehensive MRSA colonization surveillance program. It is not intended to diagnose MRSA infection nor to guide or monitor treatment for MRSA infections.      Labs: Basic Metabolic Panel:  Recent Labs Lab 06/12/17 0326 06/13/17 0316 06/14/17 0518 06/16/17 0355  NA 136 142 138 139  K 4.2 4.4 4.0 3.9  CL 105 114* 111 111  CO2 22 22 22 22   GLUCOSE 145* 83 87 76  BUN 50* 28* 17 6  CREATININE 1.73* 1.03 1.01 0.71  CALCIUM 9.1 8.4* 8.4* 8.5*   Liver Function Tests: No results for input(s): AST, ALT, ALKPHOS, BILITOT, PROT, ALBUMIN in the last 168 hours. No results for input(s): LIPASE, AMYLASE in the last 168 hours. No results for input(s): AMMONIA in the last 168 hours. CBC:  Recent Labs Lab 06/12/17 0326  06/14/17 1435 06/15/17 0244 06/16/17 0355 06/17/17 0938 06/18/17 0839  WBC 11.6*  < > 4.0 3.6* 2.9* 3.9* 4.3  NEUTROABS 9.7*  --   --   --   --   --   --   HGB 8.1*  < > 7.6* 7.5* 9.0* 8.8* 9.7*  HCT 23.6*  < > 22.5* 22.3* 26.2* 25.6* 29.1*  MCV 89.1  < > 89.6 91.8 88.2 88.9 89.3  PLT 159  < > 100* 101* 127* 138* 164  < > = values in this interval not displayed. Cardiac Enzymes:  Recent Labs Lab 06/12/17 0326 06/12/17 0533 06/12/17 1322 06/13/17 0316  TROPONINI <0.03 <0.03 <0.03 <0.03   BNP: BNP (last 3 results)  Recent Labs  11/03/16 0342 11/25/16 2105 06/13/17 0316  BNP 45.4 46.7 51.1    ProBNP (last 3 results) No results for input(s): PROBNP in the last 8760 hours.  CBG:  Recent Labs Lab  06/14/17 0741 06/15/17 0759 06/16/17 0807 06/17/17 0822 06/18/17 0823  GLUCAP 120* 87 87 152* 117*       SignedDomenic Polite MD.  [facility] 06/18/2017, 2:11 PM

## 2017-06-18 NOTE — Progress Notes (Signed)
Physical Therapy Treatment Patient Details Name: [name] Herring MRN: [id number] DOB: 1961-07-08 Today's Date: 06/18/2017    History of Present Illness 56 y.o. male with medical history significant of hypertension, depression, polysubstance abuse (tobacco, alcohol, cocaine), DVT, PE on eliquis. , HIV/AIDs (CD4 to 50 and viral load 1.477 on 05/30/17), schizophrenia, and admitted with Large duodenal ulcer with GI bleeding and acute blood loss anemia    PT Comments    Assisted pt OOB to amb a great distance in hallway.    Follow Up Recommendations  Supervision/Assistance - 24 hour;SNF (pt declined SNF)     Equipment Recommendations  Rolling walker with 5" wheels    Recommendations for Other Services       Precautions / Restrictions Precautions Precautions: Fall Restrictions Weight Bearing Restrictions: No    Mobility  Bed Mobility Overal bed mobility: Modified Independent             General bed mobility comments: increased time to complete  Transfers Overall transfer level: Needs assistance Equipment used: None Transfers: Sit to/from Stand;Stand Pivot Transfers Sit to Stand: Min guard;Supervision Stand pivot transfers: Supervision;Min guard       General transfer comment: min/guard Supervision for safety  Ambulation/Gait Ambulation/Gait assistance: Supervision;Min guard Ambulation Distance (Feet): 180 Feet Assistive device: None Gait Pattern/deviations: Step-through pattern;Decreased stride length     General Gait Details: tolerated an increased distance.  No true LOB but demonstared mild gait instability.  No need for an AD.     Stairs            Wheelchair Mobility    Modified Rankin (Stroke Patients Only)       Balance Overall balance assessment: Needs assistance         Standing balance support: No upper extremity supported Standing balance-Leahy Scale: Fair                              Cognition Arousal/Alertness:  Awake/alert Behavior During Therapy: WFL for tasks assessed/performed Overall Cognitive Status: Within Functional Limits for tasks assessed                                 General Comments: "feeling better"      Exercises      General Comments        Pertinent Vitals/Pain Pain Assessment: No/denies pain    Home Living Family/patient expects to be discharged to:: Private residence Living Arrangements: Alone   Type of Home: Apartment Home Access: Stairs to enter Entrance Stairs-Rails: Right Home Layout: One level Home Equipment: None      Prior Function Level of Independence: Independent          PT Goals (current goals can now be found in the care plan section) Acute Rehab PT Goals Patient Stated Goal: home today Progress towards PT goals: Progressing toward goals    Frequency    Min 3X/week      PT Plan Current plan remains appropriate    Co-evaluation              AM-PAC PT "6 Clicks" Daily Activity  Outcome Measure  Difficulty turning over in bed (including adjusting bedclothes, sheets and blankets)?: None Difficulty moving from lying on back to sitting on the side of the bed? : None Difficulty sitting down on and standing up from a chair with arms (e.g., wheelchair, bedside commode, etc,.)?: A  Little Help needed moving to and from a bed to chair (including a wheelchair)?: A Little Help needed walking in hospital room?: A Little Help needed climbing 3-5 steps with a railing? : A Lot 6 Click Score: 19    End of Session Equipment Utilized During Treatment: Gait belt Activity Tolerance: Patient tolerated treatment well Patient left: with call bell/phone within reach;with bed alarm set Nurse Communication: Mobility status PT Visit Diagnosis: Difficulty in walking, not elsewhere classified (R26.2);Muscle weakness (generalized) (M62.81)     Time: 2831-5176 PT Time Calculation (min) (ACUTE ONLY): 12 min  Charges:  $Gait Training:  8-22 mins                    G Codes:       Rica Koyanagi  PTA [facility]  Acute  Rehab Pager      [phone]

## 2017-06-18 NOTE — Progress Notes (Signed)
Pt given dc instructions. Pt verbalized understanding of dc plan.  Case Manager notified of home health orders and verified home health has been set up. Stacey Drain

## 2017-06-18 NOTE — Progress Notes (Signed)
Patient ID: [name] Herring, male   DOB: 12-10-1960, 55 y.o.   MRN: [id number]          [facility]    Date of Admission:  06/12/2017     Upper endoscopy on 06/15/2017 revealed a large duodenal ulcer as the source of his recent GI bleeding. His repeat CD4 count was up to 220 so he does not need to stay on prophylactic doses of trimethoprim sulfamethoxazole. I will stop it now. His viral load is relatively low and stable at 40. I will continue his Genvoya. He can follow-up in our clinic as scheduled. I will sign off now.  Michel Bickers, MD [facility] [phone] pager   [phone] cell 06/18/2017, 12:08 PM

## 2017-06-18 NOTE — Care Management Important Message (Signed)
Important Message  Patient Details  Name: [name] Herring MRN: [id number] Date of Birth: 09/04/1961   Medicare Important Message Given:  Yes    Kerin Salen 06/18/2017, 12:41 [place] Message  Patient Details  Name: [name] Herring MRN: [id number] Date of Birth: July 24, 1961   Medicare Important Message Given:  Yes    Kerin Salen 06/18/2017, 12:41 PM

## 2017-06-18 NOTE — Progress Notes (Signed)
Pt sister Ardell Makarewicz has requested to be removed as the pt's HCPOA.  Spiritual care services notified and consult for advance directive changes placed.  Bubba Hales, RN, [place] contacted and consulted with RN on how to remove the sister as the HCPOA. At the request of sister, Elisa, Sorlie was voided and she was removed as contact for the pt. Dr Broadus John was notified.  Stacey Drain

## 2017-06-19 ENCOUNTER — Telehealth: Payer: Self-pay

## 2017-06-19 NOTE — Telephone Encounter (Signed)
Transitional Care Clinic Post-discharge Follow-Up Phone Call:  Call placed to the patient and explained the [facility] T[facility] and he agreed to follow up with TCC.  His PCP is Dr Jarold Song. An appointment was scheduled for 06/25/17 @ 1400.  He has 4 hospital admissions and 5 Ed visits in the past year.   Date of Discharge: 06/18/2017 Principal Discharge Diagnosis(es):  Duodenal ulcer, GIB, HIV, depression, HTN, h/o PE and DVT Post-discharge Communication: (Clearly document all attempts clearly and date contact made) call placed to the patient Call Completed: Yes                    With Whom: Patient Interpreter Needed: No     Please check all that apply:  X  Patient is knowledgeable of his/her condition(s) and/or treatment. - Not able to ascertain his level of knowledge over the phone.   X  Patient is caring for self at home.  - He is living alone in an second floor apartment. He noted that he has 2 flights of stairs to negotiate in the home. No family support. He stated that his children live in [place].  He also stated that he does his own grocery shopping and uses the city bus for transportation.  ? Patient is receiving assist at home from family and/or caregiver. Family and/or caregiver is knowledgeable of patient's condition(s) and/or treatment. X  Patient is receiving home health services. If so, name of agency. - referral was made to [facility] for PT and RN but the patient said that he has not heard from the agency yet.      Medication Reconciliation:  ? Medication list reviewed with patient. X  Patient obtained all discharge medications. If not, why? - he said that he has all of his medications except for the pantoprazole as he needs to get it filled. He stated that he uses [facility] in [place] and they will mail his medications to him. He did not want to review his medication list and he did not have his discharge list available and was not able to state what medication is new, what he is  supposed to stop taking and how the eliquis and haldol have changed. He stated that he took eliquis once today. Explained to him that as per his discharge instructions, he is to take eliquis 2.5 mg twice a day starting 06/29/17 and haldol 0.5 tablet ( 5 mg total) twice a day starting 06/18/17. He stated that he understood.    Activities of Daily Living:  ? Independent X  Needs assist (describe; ? home DME used) - stated he uses [facility] when needed. Awaiting home health evaluation.  ? [facility] (describe, ? home DME used)   Community resources in place for patient:  X  He reported that he has " reconnected" with  [facility] from [facility] and [place] will be providing him transportation to his appointment at [facility].  He also reports that he follows up with [facility] every 3 months.  X  Home Health/Home DME - [facility] ? Assisted Living ? Support Group          Patient Education: He gave this CM permission to call [facility] to discuss his current medical status and needs.   He noted that he receives $ 918/month disability and he does not receive food stamps.         Questions/Concerns discussed:the patient stated that he had no questions/concerns at this time.   Update  sent to Dr Jarold Song regarding the patient's medications.

## 2017-06-20 ENCOUNTER — Telehealth: Payer: Self-pay

## 2017-06-20 ENCOUNTER — Encounter: Payer: Self-pay | Admitting: Infectious Diseases

## 2017-06-20 ENCOUNTER — Ambulatory Visit: Payer: Self-pay | Admitting: *Deleted

## 2017-06-20 ENCOUNTER — Ambulatory Visit (INDEPENDENT_AMBULATORY_CARE_PROVIDER_SITE_OTHER): Payer: Medicare Other | Admitting: Infectious Diseases

## 2017-06-20 ENCOUNTER — Telehealth: Payer: Self-pay | Admitting: *Deleted

## 2017-06-20 VITALS — BP 134/72 | HR 87 | Temp 98.4°F | Ht 70.0 in | Wt 131.0 lb

## 2017-06-20 DIAGNOSIS — K254 Chronic or unspecified gastric ulcer with hemorrhage: Secondary | ICD-10-CM | POA: Diagnosis not present

## 2017-06-20 DIAGNOSIS — F1020 Alcohol dependence, uncomplicated: Secondary | ICD-10-CM

## 2017-06-20 DIAGNOSIS — R634 Abnormal weight loss: Secondary | ICD-10-CM | POA: Diagnosis not present

## 2017-06-20 DIAGNOSIS — B20 Human immunodeficiency virus [HIV] disease: Secondary | ICD-10-CM

## 2017-06-20 DIAGNOSIS — F329 Major depressive disorder, single episode, unspecified: Secondary | ICD-10-CM

## 2017-06-20 DIAGNOSIS — Z23 Encounter for immunization: Secondary | ICD-10-CM

## 2017-06-20 DIAGNOSIS — N179 Acute kidney failure, unspecified: Secondary | ICD-10-CM | POA: Diagnosis not present

## 2017-06-20 DIAGNOSIS — K264 Chronic or unspecified duodenal ulcer with hemorrhage: Secondary | ICD-10-CM

## 2017-06-20 DIAGNOSIS — F32A Depression, unspecified: Secondary | ICD-10-CM

## 2017-06-20 LAB — CBC WITH DIFFERENTIAL/PLATELET
BASOS ABS: 22 {cells}/uL (ref 0–200)
BASOS PCT: 0.6 %
EOS ABS: 40 {cells}/uL (ref 15–500)
Eosinophils Relative: 1.1 %
HEMATOCRIT: 28.5 % — AB (ref 38.5–50.0)
HEMOGLOBIN: 9.3 g/dL — AB (ref 13.2–17.1)
LYMPHS ABS: 1058 {cells}/uL (ref 850–3900)
MCH: 30.1 pg (ref 27.0–33.0)
MCHC: 32.6 g/dL (ref 32.0–36.0)
MCV: 92.2 fL (ref 80.0–100.0)
MONOS PCT: 13.1 %
MPV: 10.2 fL (ref 7.5–12.5)
NEUTROS ABS: 2009 {cells}/uL (ref 1500–7800)
Neutrophils Relative %: 55.8 %
Platelets: 215 10*3/uL (ref 140–400)
RBC: 3.09 10*6/uL — ABNORMAL LOW (ref 4.20–5.80)
RDW: 17.7 % — ABNORMAL HIGH (ref 11.0–15.0)
Total Lymphocyte: 29.4 %
WBC: 3.6 10*3/uL — ABNORMAL LOW (ref 3.8–10.8)
WBCMIX: 472 {cells}/uL (ref 200–950)

## 2017-06-20 MED ORDER — MIRTAZAPINE 30 MG PO TABS: 30.0000 mg | ORAL_TABLET | Freq: Every day | ORAL | 3 refills | Status: DC

## 2017-06-20 MED ORDER — PANTOPRAZOLE SODIUM 40 MG PO TBEC: 40.0000 mg | DELAYED_RELEASE_TABLET | Freq: Two times a day (BID) | ORAL | 1 refills | Status: DC

## 2017-06-20 MED ORDER — SULFAMETHOXAZOLE-TRIMETHOPRIM 800-160 MG PO TABS: 1.0000 | ORAL_TABLET | Freq: Every day | ORAL | 1 refills | Status: DC

## 2017-06-20 NOTE — Assessment & Plan Note (Signed)
Secondary to ETOH abuse, poor appetite and HIV infection - Provided food for him today from pantry. Increase mirtazapine to help with appetite. If no effect will add Megace to help.

## 2017-06-20 NOTE — Telephone Encounter (Signed)
Prior to call I spoke with [name] Herring(Bridge Counselor) who stated he had a conversation with [name] Herring([facility]) and there is a concern for his [name] Herring ability to take his medications as ordered.  I contacted [name] Herring and did not receive a answer. Phone is not set up to receive VM's After waiting for about 5 minutes I attempted the call again. I was unable to get an answer.   Called [name] Herring(pt's sister) and it continues to go straight to automated message "the person you are trying to reach has a VM that has not been set up yet"  Sent a message to [facility] to see if he can get in contact with [name]. Sometimes people with not answer unfamiliar numbers.

## 2017-06-20 NOTE — Assessment & Plan Note (Deleted)
Provided food for him today from pantry. Increase mirtazapine to help with appetite. If no effect will add Megace to help.

## 2017-06-20 NOTE — Assessment & Plan Note (Addendum)
Previous treatment efforts. Cited loneliness and depression as reasons that contribute to his drinking. Currently tells me he hasn't drank in a few days. Discussed re-entering supportive environment with [facility] as he seemed to really enjoy that activity and the environment. Attempted to have him meet with Judeen Hammans today but she was with another patient - staff message sent to her for follow up.

## 2017-06-20 NOTE — Progress Notes (Signed)
Brief Narrative: [name] Herring is a patient with a history of HIV (08/18/1998) previously followed at [facility] with last visit 2015. Was seen by Dr. Johnnye Sima in Feb 2018. History of ETOH/cocaine abuse, previously homeless. Previous ART includes Kaletra + Combivir. Now on Genvoya.   PCP - Arnoldo Morale, MD    Patient Active Problem List   Diagnosis Date Noted  . Benign neoplasm of transverse colon   . Duodenal ulcer with hemorrhage   . Unintentional weight loss 06/14/2017  . Encounter for palliative care   . Coagulopathy ([place])   . Abnormal CT of the abdomen   . AKI (acute kidney injury) ([place]) 06/12/2017  . Chest pain 06/12/2017  . Nausea vomiting and diarrhea 06/12/2017  . Emphysema lung ([place]) 11/20/2016  . DVT of lower extremity, bilateral ([place]) 11/04/2016  . Tobacco abuse 11/02/2016  . PE (pulmonary thromboembolism) ([place]) 11/02/2016  . Cocaine dependence with cocaine-induced mood disorder ([place]) 03/29/2016  . HTN (hypertension) 08/25/2015  . Paranoid schizophrenia, chronic condition ([place]) 12/30/2014  . Alcohol use disorder, severe, dependence ([place]) 12/27/2014  . ERECTILE DYSFUNCTION 09/03/2007  . Human immunodeficiency virus (HIV) disease ([place]) 11/09/2006  . CA IN SITU, RECTUM 11/09/2006  . Hypothyroidism 11/09/2006  . DISORDER, BIPOLAR NOS 11/09/2006  . Depression 11/09/2006  . PYELONEPHRITIS 11/09/2006    Patient's Medications  New Prescriptions   No medications on file  Previous Medications   ACETAMINOPHEN (TYLENOL) 500 MG TABLET    Take 1 tablet (500 mg total) by mouth every 6 (six) hours as needed for moderate pain.   APIXABAN (ELIQUIS) 2.5 MG TABS TABLET    Take 1 tablet (2.5 mg total) by mouth 2 (two) times daily. Restart on 10/5   BENZTROPINE (COGENTIN) 1 MG TABLET    1 tab in the morning 2 tabs at night   ELVITEGRAVIR-COBICISTAT-EMTRICITABINE-TENOFOVIR (GENVOYA) 150-150-200-10 MG TABS TABLET    TAKE 1 TABLET BY MOUTH DAILY WITH BREAKFAST. *CALL [phone] to  see Dr Johnnye Sima ASAP*   FLUCONAZOLE (DIFLUCAN) 100 MG TABLET    Take 1 tablet (100 mg total) by mouth once a week.   GABAPENTIN (NEURONTIN) 600 MG TABLET    Take 600 mg by mouth at bedtime.   HALOPERIDOL (HALDOL) 10 MG TABLET    Take 0.5 tablets (5 mg total) by mouth 2 (two) times daily.   MAGNESIUM OXIDE (MAG-OX) 400 (241.3 MG) MG TABLET    Take 1 tablet (400 mg total) by mouth 2 (two) times daily.   METOPROLOL TARTRATE (LOPRESSOR) 25 MG TABLET    Take 1 tablet (25 mg total) by mouth 2 (two) times daily.   MULTIPLE VITAMIN (MULTIVITAMIN WITH MINERALS) TABS TABLET    Take 1 tablet by mouth daily.   ONDANSETRON (ZOFRAN) 4 MG TABLET    Take 1 tablet (4 mg total) by mouth every 6 (six) hours.   TIOTROPIUM (SPIRIVA) 18 MCG INHALATION CAPSULE    Place 1 capsule (18 mcg total) into inhaler and inhale daily.   VALBENAZINE TOSYLATE (INGREZZA) 80 MG CAPS    Take 80 mg by mouth 2 (two) times daily.   Modified Medications   Modified Medication Previous Medication   MIRTAZAPINE (REMERON) 30 MG TABLET mirtazapine (REMERON) 15 MG tablet      Take 1 tablet (30 mg total) by mouth at bedtime.    Take 15 mg by mouth at bedtime.   PANTOPRAZOLE (PROTONIX) 40 MG TABLET pantoprazole (PROTONIX) 40 MG tablet      Take 1  tablet (40 mg total) by mouth 2 (two) times daily before a meal.    Take 1 tablet (40 mg total) by mouth 2 (two) times daily before a meal.   SULFAMETHOXAZOLE-TRIMETHOPRIM (BACTRIM DS) 800-160 MG TABLET sulfamethoxazole-trimethoprim (BACTRIM DS) 800-160 MG tablet      Take 1 tablet by mouth daily.    Take 1 tablet by mouth daily.  Discontinued Medications   No medications on file    Subjective: [name] Herring presents to clinic today for routine follow up care for his HIV infection.  HPI:  HIV =  Currently on a regimen of Genvoya and reports good adherence with it. Was started on this medication in February of this year after he re-entered care for his HIV infection. Previously was seen at [facility]  until ~2016. He endorses weight loss today in addition to weakness. Otherwise endorses no complaints today suggestive of associated opportunistic infection or advancing HIV disease such as fevers, night sweats, cough, SOB, nausea, vomiting, diarrhea, headache, sensory changes, lymphadenopathy or oral thrush. He is working with our Engineer, materials, Sales executive to retain in care. He is not taking his Bactrim.   Weight Loss = Currently living in an apartment and has access to food, however does not have appetite. Reports he had been clean from illicit drugs ~1 year now but still reports drinking about a fifth of liquor a week. Has attempted to quit 3 times. Tells me that one big reason he drinks is because he is lonely. Does not have direct transportation access.   Depression = reports he is currently still suffering from depressed mood and feels very lonely. Taking the mirtazapine 15 mg nightly and tells me that this has helped but still could use some improvement in mood. Sleep has been acceptable per his account. Associated symptoms include weight loss and ETOH use.    Interval History - [name] Herring was Hospitalized recently until 06/17/2017 for GIB due to bleeding duodenal ulcer in setting of alcohol abuse, anticoagulant and frequent NSAID use of which he was using for headaches. Also had AKI that resolved by discharge (Creat 1.73 >> 0.71 by D/C). Currently his Eliquis is on hold until 06/29/17. He is supposed to be taking Protonix BID but has not picked this up. Was recommended SNF at discharge but could not financially accommodate this - receiving home health nursing services. EGD/Colonoscopy performed. Has not followed up with GI - duodenal biopsy pending.    Review of Systems: Review of Systems  Constitutional: Positive for weight loss. Negative for chills, fever and malaise/fatigue.  HENT:       No dental problems  Respiratory: Negative for cough and sputum production.   Cardiovascular: Negative for chest  pain and leg swelling.  Gastrointestinal: Negative for abdominal pain, blood in stool, diarrhea and vomiting.  Skin: Negative for rash.  Neurological: Positive for weakness. Negative for dizziness, tingling and headaches.  Psychiatric/Behavioral: Positive for depression and substance abuse (ETOH ). The patient is not nervous/anxious and does not have insomnia.     Past Medical History:  Diagnosis Date  . Depression   . HIV (human immunodeficiency virus infection) ([place])   . Hypertension   . Immune deficiency disorder ([place])   . PE (pulmonary thromboembolism) ([place]) 2018  . Personality disorder   . Schizophrenia [facility])     Social History  Substance Use Topics  . Smoking status: Current Every Day Smoker    Packs/day: 1.00    Years: 33.00    Types: Cigarettes  .  Smokeless tobacco: Never Used     Comment: cutting back  . Alcohol use 1.8 oz/week    3 Shots of liquor per week     Comment: per patient drinks one pint a month 06/20/17    Allergies  Allergen Reactions  . Amoxicillin Shortness Of Breath and Rash    Has patient had a PCN reaction causing immediate rash, facial/tongue/throat swelling, [name] Has patient had a PCN reaction causing severe rash involving mucus membranes or skin necrosis: no Has patient had a PCN reaction that required hospitalization: yes drs office visit Has patient had a PCN reaction occurring within the last 10 years: no If all of the above answers are "NO", then may proceed with Cephalosporin use.   . Latex Shortness Of Breath    Objective:  Vitals:   06/20/17 1403  BP: 134/72  Pulse: 87  Temp: 98.4 F (36.9 C)  TempSrc: Oral  Weight: 131 lb (59.4 kg)  Height: 5\' 10"  (1.778 m)   Body mass index is 18.8 kg/m.  Physical Exam  Constitutional: He is oriented to person, place, and time.  Thin, chronically ill appearing AA male that appears older than stated age.   HENT:  Mouth/Throat: Oropharynx is clear  and moist.  Eyes: No scleral icterus.  Cardiovascular: Normal rate, regular rhythm and normal heart sounds.   Pulmonary/Chest: Effort normal and breath sounds normal. No respiratory distress.  Abdominal: Soft. Bowel sounds are normal. He exhibits no distension. There is no tenderness.  Lymphadenopathy:    He has no cervical adenopathy.  Neurological: He is alert and oriented to person, place, and time. He has normal strength.  Shuffled gait  Skin: Skin is warm and dry.  Psychiatric: Affect normal. His mood appears not anxious. He exhibits a depressed mood.    Lab Results Lab Results  Component Value Date   WBC 4.3 06/18/2017   HGB 9.7 (L) 06/18/2017   HCT 29.1 (L) 06/18/2017   MCV 89.3 06/18/2017   PLT 164 06/18/2017    Lab Results  Component Value Date   CREATININE 0.71 06/16/2017   BUN 6 06/16/2017   NA 139 06/16/2017   K 3.9 06/16/2017   CL 111 06/16/2017   CO2 22 06/16/2017    Lab Results  Component Value Date   ALT 18 05/29/2017   AST 39 05/29/2017   ALKPHOS 55 05/29/2017   BILITOT 0.9 05/29/2017    Lab Results  Component Value Date   CHOL 153 08/05/2015   HDL 93 08/05/2015   LDLCALC 30 08/05/2015   TRIG 151 (H) 08/05/2015   CHOLHDL 1.6 08/05/2015   HIV 1 RNA Quant (copies/mL)  Date Value  11/13/2016 152 (H)  07/16/2016 <20  08/05/2015 <20   CD4 T Cell Abs (/uL)  Date Value  06/14/2017 220 (L)  11/13/2016 290 (L)  07/16/2016 470   No results found for: HAV Lab Results  Component Value Date   HEPBSAG Negative 09/11/2007   HEPBSAB Undetermined 09/11/2007   Lab Results  Component Value Date   HCVAB Negative 09/11/2007   Lab Results  Component Value Date   CHLAMYDIAWP Negative 01/13/2015   N Negative 01/13/2015   No results found for: GCPROBEAPT No results found for: QUANTGOLD No results found for: RPR    Problem List Items Addressed This Visit      Digestive   Duodenal ulcer with hemorrhage    S/P hospitalization, transfusions and  EGD. I have advised him he needs  to fill and start taking the Protonix as advised from GI team. Sent to pharmacy to be mailed to him. No interactions with Genvoya. I have also sent amb ref to GI for outpatient follow up. I will recheck a hemoglobin today as he left the hospital anemic. No further reports of melena.       RESOLVED: GIB (gastrointestinal bleeding)   Relevant Medications   pantoprazole (PROTONIX) 40 MG tablet   Other Relevant Orders   Ambulatory referral to Gastroenterology     Genitourinary   AKI (acute kidney injury) ([place])    In setting of poor nutrition, hypotension and acute blood loss. At discharge his creatinine returned to normal function. Lisinopril is on hold currently. Will assess creatinine today.         Other   Alcohol use disorder, severe, dependence ([place])    Previous treatment efforts. Cited loneliness and depression as reasons that contribute to his drinking. Currently tells me he hasn't drank in a few days. Discussed re-entering supportive environment with [facility] as he seemed to really enjoy that activity and the environment. Attempted to have him meet with Judeen Hammans today but she was with another patient - staff message sent to her for follow up.       Depression    Increase Mirtazapine. Will have Judeen Hammans reach out to him.       Relevant Medications   mirtazapine (REMERON) 30 MG tablet   Human immunodeficiency virus (HIV) disease ([place]) - Primary (Chronic)    Continue on his Genvoya. CD4 was initially measured at 34, however this was in the setting of profound anemia. Apparently he had labs rechecked 6 days ago and were 220 - will reassess at clinic today in addition to viral load. I will have him continue Bactrim for now until his CD4 stays up above 200 for a few months. Initially could not find viral load in records so I repeated today. Upon further look I see his last viral load was 40. Seems he has been under good control and compliant with his ART.        Relevant Medications   sulfamethoxazole-trimethoprim (BACTRIM DS) 800-160 MG tablet   Other Relevant Orders   T-helper cell (CD4)- ([facility] clinic only)   HIV RNA, RTPCR W/R GT (RTI, PI,INT)   CBC with Differential/Platelet   RESOLVED: Protein-calorie malnutrition, severe (HCC)   Relevant Medications   mirtazapine (REMERON) 30 MG tablet   Unintentional weight loss    Secondary to ETOH abuse, poor appetite and HIV infection - Provided food for him today from pantry. Increase mirtazapine to help with appetite. If no effect will add Megace to help.        Other Visit Diagnoses    Need for immunization against influenza       Relevant Orders   Flu Vaccine QUAD 36+ mos IM (Completed)     Flu shot today. Will have him return in 1 month to follow up on medication change and labs   Janene Madeira, MSN, NP-C [facility] for Infectious [place] Pager: [phone]  06/20/17 10:05 PM

## 2017-06-20 NOTE — Assessment & Plan Note (Signed)
Increase Mirtazapine. Will have Judeen Hammans reach out to him.

## 2017-06-20 NOTE — Patient Instructions (Addendum)
Tylenol for headaches. NO IBUPROFEN, ALEVE or ASPIRIN   Start taking your Bactrim again - one tablet once a day. This will be mailed to you.   Start taking the Protonix one tablet two times a day - this is what your GI doctor wants you to take to help your ulcer heal.   Will increase your Mirtazapine to 30 mg tablet once a day - the pharmacy will mail you a new dose. If this does not help with your appetite we can try a different medication.   Lab work today  Please come back in 4 weeks to see me again and check in on how you are doing  Need to set up an appointment with GI doctor.

## 2017-06-20 NOTE — Assessment & Plan Note (Addendum)
S/P hospitalization, transfusions and EGD. I have advised him he needs to fill and start taking the Protonix as advised from GI team. Sent to pharmacy to be mailed to him. No interactions with Genvoya. I have also sent amb ref to GI for outpatient follow up. I will recheck a hemoglobin today as he left the hospital anemic. No further reports of melena.

## 2017-06-20 NOTE — Assessment & Plan Note (Addendum)
In setting of poor nutrition, hypotension and acute blood loss. At discharge his creatinine returned to normal function. Lisinopril is on hold currently. Will assess creatinine today.

## 2017-06-20 NOTE — Telephone Encounter (Addendum)
Call placed to [facility] to determine when the patient will be seen by home health RN. Spoke to [place] who stated that they tried to call the patient yesterday but were not able to reach him. They will try again. Informed her that the patient will need the nurse to thoroughly review his medications with the discharge instructions.    Attempted to contact Starr Lake, Bridge Counselor with [employer] to discuss the assistance that he will be providing for the patient. Voicemail message left requesting a call back to # [phone].   Call received from [facility], [employer] He explained that he has known the patient for many years as a Tourist information centre manager and now a Engineer, materials.  There was a gap in the case management as the patient addressed multiple medical and behavioral health needs. Mitch had recently reconnected with the patient and saw him days prior to his recent hospitalization. Mitch noted that he is taking the patient to his appointment at [facility] this afternoon and will bring the patient to his appointment at [facility] on 06/25/17. He also noted that Kinnie Scales, RN Marcial Pacas will be working with the patient and will attempt to make home visits. This CM informed Mitch of the concerns about the patient's ability to manage his medications. Mitch noted that he works closely with Delories [employer] and will let her know of the medication concerns.

## 2017-06-20 NOTE — Telephone Encounter (Signed)
Call placed to Starr Lake to inform him that the patient's appointment for 06/25/17 will need to be rescheduled.  Karn Pickler stated that he will be able to bring the patient to the clinic on 06/29/17 @ 1400 and the appointment was re-scheduled.

## 2017-06-20 NOTE — Assessment & Plan Note (Signed)
Continue on his Genvoya. CD4 was initially measured at 28, however this was in the setting of profound anemia. Apparently he had labs rechecked 6 days ago and were 220 - will reassess at clinic today in addition to viral load. I will have him continue Bactrim for now until his CD4 stays up above 200 for a few months. Initially could not find viral load in records so I repeated today. Upon further look I see his last viral load was 40. Seems he has been under good control and compliant with his ART.

## 2017-06-21 LAB — T-HELPER CELL (CD4) - (RCID CLINIC ONLY)
CD4 % Helper T Cell: 27 % — ABNORMAL LOW (ref 33–55)
CD4 T CELL ABS: 300 /uL — AB (ref 400–2700)

## 2017-06-22 ENCOUNTER — Ambulatory Visit: Payer: Self-pay | Admitting: *Deleted

## 2017-06-22 ENCOUNTER — Encounter: Payer: Self-pay | Admitting: Infectious Diseases

## 2017-06-22 VITALS — BP 110/62 | HR 76

## 2017-06-22 DIAGNOSIS — B20 Human immunodeficiency virus [HIV] disease: Secondary | ICD-10-CM

## 2017-06-23 ENCOUNTER — Other Ambulatory Visit: Payer: Self-pay | Admitting: Family Medicine

## 2017-06-24 LAB — HIV RNA, RTPCR W/R GT (RTI, PI,INT)
HIV 1 RNA QUANT: 63 {copies}/mL — AB
HIV-1 RNA Quant, Log: 1.8 Log copies/mL — ABNORMAL HIGH

## 2017-06-25 ENCOUNTER — Other Ambulatory Visit: Payer: Self-pay | Admitting: Family Medicine

## 2017-06-25 ENCOUNTER — Institutional Professional Consult (permissible substitution): Payer: Self-pay | Admitting: Licensed Clinical Social Worker

## 2017-06-25 ENCOUNTER — Inpatient Hospital Stay: Payer: Self-pay | Admitting: Family Medicine

## 2017-06-25 DIAGNOSIS — I10 Essential (primary) hypertension: Secondary | ICD-10-CM

## 2017-06-26 ENCOUNTER — Telehealth: Payer: Self-pay | Admitting: Family Medicine

## 2017-06-26 ENCOUNTER — Telehealth: Payer: Self-pay | Admitting: *Deleted

## 2017-06-26 NOTE — Telephone Encounter (Signed)
[facility] called to request verbal order home health physical therapy 2x a week for 2 week and once every other wee for 2 weeks for  balance Call [phone] (jeff)

## 2017-06-26 NOTE — Telephone Encounter (Signed)
Spoke with [name] Herring prior to his visit with Thayer Jew and asked if he would please see if the patient's medications have been delivered. During my firday visit Bandy stated he was waiting on medications to be mailed to him.  Received a call from [facility] and he stated the patient has not received any medications.

## 2017-06-26 NOTE — Telephone Encounter (Signed)
RN visited with [name] Herring on Friday(09/29) and he stated his medications were suppose to be delivered but had not arrived.  I asked Mitch([facility] Bridge Counselor) to please let me know if the patient still have not received his medications during his Monday visit with [name] Herring. Mitch called and stated the patient still have not received his medications. Since I am not sure when the medications were promised I contacted [place] and was made aware that the medications are due to be delivered today. Medications that will be delivered includes Genvoya, Protonix, Lopressor, fluconazole and Remeron.

## 2017-06-26 NOTE — Telephone Encounter (Signed)
Merry Proud was called and given verbal orders for physical therapy.

## 2017-06-27 ENCOUNTER — Telehealth: Payer: Self-pay | Admitting: *Deleted

## 2017-06-27 NOTE — Telephone Encounter (Signed)
Raekwan called me by mistake. I have saved my number in his phone during our last visit. Took the opportunity to arrange a home visit with Thayer Jew for tomorrow

## 2017-06-28 ENCOUNTER — Ambulatory Visit: Payer: Self-pay | Admitting: *Deleted

## 2017-06-28 DIAGNOSIS — B20 Human immunodeficiency virus [HIV] disease: Secondary | ICD-10-CM

## 2017-06-29 ENCOUNTER — Ambulatory Visit: Payer: Medicare Other | Attending: Family Medicine | Admitting: Family Medicine

## 2017-06-29 ENCOUNTER — Encounter: Payer: Self-pay | Admitting: Family Medicine

## 2017-06-29 ENCOUNTER — Telehealth: Payer: Self-pay

## 2017-06-29 VITALS — BP 89/55 | HR 63 | Temp 97.5°F | Ht 70.0 in | Wt 127.6 lb

## 2017-06-29 DIAGNOSIS — F200 Paranoid schizophrenia: Secondary | ICD-10-CM | POA: Insufficient documentation

## 2017-06-29 DIAGNOSIS — N179 Acute kidney failure, unspecified: Secondary | ICD-10-CM | POA: Insufficient documentation

## 2017-06-29 DIAGNOSIS — F329 Major depressive disorder, single episode, unspecified: Secondary | ICD-10-CM | POA: Diagnosis not present

## 2017-06-29 DIAGNOSIS — Z7901 Long term (current) use of anticoagulants: Secondary | ICD-10-CM | POA: Diagnosis not present

## 2017-06-29 DIAGNOSIS — K264 Chronic or unspecified duodenal ulcer with hemorrhage: Secondary | ICD-10-CM | POA: Diagnosis not present

## 2017-06-29 DIAGNOSIS — F209 Schizophrenia, unspecified: Secondary | ICD-10-CM | POA: Diagnosis not present

## 2017-06-29 DIAGNOSIS — Z792 Long term (current) use of antibiotics: Secondary | ICD-10-CM | POA: Insufficient documentation

## 2017-06-29 DIAGNOSIS — D649 Anemia, unspecified: Secondary | ICD-10-CM | POA: Diagnosis not present

## 2017-06-29 DIAGNOSIS — Z880 Allergy status to penicillin: Secondary | ICD-10-CM | POA: Diagnosis not present

## 2017-06-29 DIAGNOSIS — Z79899 Other long term (current) drug therapy: Secondary | ICD-10-CM | POA: Diagnosis not present

## 2017-06-29 DIAGNOSIS — B20 Human immunodeficiency virus [HIV] disease: Secondary | ICD-10-CM | POA: Diagnosis not present

## 2017-06-29 DIAGNOSIS — F609 Personality disorder, unspecified: Secondary | ICD-10-CM | POA: Insufficient documentation

## 2017-06-29 DIAGNOSIS — I82433 Acute embolism and thrombosis of popliteal vein, bilateral: Secondary | ICD-10-CM | POA: Diagnosis not present

## 2017-06-29 DIAGNOSIS — I10 Essential (primary) hypertension: Secondary | ICD-10-CM | POA: Insufficient documentation

## 2017-06-29 DIAGNOSIS — Z91040 Latex allergy status: Secondary | ICD-10-CM | POA: Diagnosis not present

## 2017-06-29 DIAGNOSIS — I2699 Other pulmonary embolism without acute cor pulmonale: Secondary | ICD-10-CM | POA: Diagnosis not present

## 2017-06-29 DIAGNOSIS — Z9889 Other specified postprocedural states: Secondary | ICD-10-CM | POA: Insufficient documentation

## 2017-06-29 DIAGNOSIS — R079 Chest pain, unspecified: Secondary | ICD-10-CM | POA: Insufficient documentation

## 2017-06-29 DIAGNOSIS — F1020 Alcohol dependence, uncomplicated: Secondary | ICD-10-CM | POA: Insufficient documentation

## 2017-06-29 DIAGNOSIS — I9589 Other hypotension: Secondary | ICD-10-CM | POA: Diagnosis not present

## 2017-06-29 DIAGNOSIS — D6851 Activated protein C resistance: Secondary | ICD-10-CM | POA: Diagnosis not present

## 2017-06-29 DIAGNOSIS — R0602 Shortness of breath: Secondary | ICD-10-CM | POA: Diagnosis not present

## 2017-06-29 DIAGNOSIS — G4700 Insomnia, unspecified: Secondary | ICD-10-CM | POA: Diagnosis not present

## 2017-06-29 DIAGNOSIS — G4709 Other insomnia: Secondary | ICD-10-CM

## 2017-06-29 MED ORDER — METOPROLOL TARTRATE 25 MG PO TABS: 12.5000 mg | ORAL_TABLET | Freq: Two times a day (BID) | ORAL | 6 refills | Status: DC

## 2017-06-29 NOTE — Telephone Encounter (Signed)
Met with  Starr Lake, Bridge Counselor, when he was in the clinic today waiting for the patient.  He explained that the patient has not been to [facility] in 2 years. The patient states that his psychiatrist at [facility] has prescribed his psychotropic medications. Karn Pickler is in the process of determining which psychiatrist is prescribing the medications.  Met with the patient when he was in the clinic today for his appointment with Dr Jarold Song. The patient confirmed that he sees a psychiatrist at [facility] and he has an Copywriter, advertising.

## 2017-06-29 NOTE — Progress Notes (Signed)
[name] Herring  Date of Telephone Encounter:06/19/17  Date of 1st service:06/29/17   Admit Date:06/12/17 Discharge Date: 06/18/17   Subjective:  Patient ID: [name] Herring, male    DOB: 07-22-1961  Age: 56 y.o. MRN: [id number]  CC: Hospitalization Follow-up   HPI [name] Herring Is a 56 year old male with a history of hypertension, HIV (CD4 count of 470, viral load of less than 20 currently on antiretroviral therapy), bilateral submassive pulmonary embolism, acute DVT of branch of the R Gastrocnemius vein, left lower extremity acute mobile thrombus of the proximal popliteal vein,acute DVT of the  distal to mid posterior tibial vein with right heart strain, placement of IVC filter in 10/2016 recently hospitalized for GI bleed.  He had presented to the ED with chest pain, shortness of breath, rectal bleed; found to be anemic with hemoglobin of 6.1 for which he received 3 PRBC.  Upper endoscopy revealed giant duodenal ulcer which was clipped and colonoscopy revealed a polyp in the transverse colon which was removed, pathology was benign.   He also had acute renal failure with creatinine of 1.73 thought to be prerenal; IV fluids administered, lisinopril placed on hold. His condition improved and discharge hemoglobin was 9.3; discharge creatinine was 0.73   Since discharge he has been to see infectious disease and has been compliant with his medications; Remeron dose was increased at his visit on 06/20/17 He denies hematochezia or hematemesis  but does have some intermittent lower abdominal pain worse with meals. He endorses compliance with all of his medications  With regards to mental health he informs me that his psychiatrist with [employer] has been prescribing his psychotropic medications; he has an upcoming appointment with [name] Herring but is unsure of date.  He complains of insomnia which is chronic and review of his chart indicates he is on Remeron but he notices no  improvement even with recent increase of Remeron.  Past Medical History:  Diagnosis Date  . Depression   . HIV (human immunodeficiency virus infection) ([place])   . Hypertension   . Immune deficiency disorder ([place])   . PE (pulmonary thromboembolism) ([place]) 2018  . Personality disorder ([place])   . Schizophrenia [facility])     Past Surgical History:  Procedure Laterality Date  . COLONOSCOPY WITH PROPOFOL N/A 06/15/2017   Procedure: COLONOSCOPY WITH PROPOFOL;  Surgeon: Irene Shipper, MD;  Location: [facility];  Service: Endoscopy;  Laterality: N/A;  . ESOPHAGOGASTRODUODENOSCOPY (EGD) WITH PROPOFOL N/A 06/15/2017   Procedure: ESOPHAGOGASTRODUODENOSCOPY (EGD) WITH PROPOFOL;  Surgeon: Irene Shipper, MD;  [facility]: [facility];  Service: Endoscopy;  Laterality: N/A;  . IR GENERIC HISTORICAL  11/04/2016   IR IVC FILTER PLMT / S&I Burke Keels GUID/MOD SED 11/04/2016 Aletta Edouard, MD [facility]-INTERV RAD    Allergies  Allergen Reactions  . Amoxicillin Shortness Of Breath and Rash    Has patient had a PCN reaction causing immediate rash, facial/tongue/throat swelling, [name] Has patient had a PCN reaction causing severe rash involving mucus membranes or skin necrosis: no Has patient had a PCN reaction that required hospitalization: yes drs office visit Has patient had a PCN reaction occurring within the last 10 years: no If all of the above answers are "NO", then may proceed with Cephalosporin use.   . Latex Shortness Of Breath     Outpatient Medications Prior to Visit  Medication Sig Dispense Refill  . acetaminophen (TYLENOL) 500 MG tablet Take 1 tablet (500 mg total) by mouth every  6 (six) hours as needed for moderate pain. 30 tablet 0  . benztropine (COGENTIN) 1 MG tablet 1 tab in the morning 2 tabs at night 14 tablet 0  . elvitegravir-cobicistat-emtricitabine-tenofovir (GENVOYA) 150-150-200-10 MG TABS tablet TAKE 1 TABLET BY MOUTH DAILY WITH BREAKFAST. *CALL  [phone] to see Dr [name] Herring ASAP* 30 tablet 1  . gabapentin (NEURONTIN) 600 MG tablet Take 600 mg by mouth at bedtime.    . haloperidol (HALDOL) 10 MG tablet Take 0.5 tablets (5 mg total) by mouth 2 (two) times daily.    . magnesium oxide (MAG-OX) 400 (241.3 Mg) MG tablet Take 1 tablet (400 mg total) by mouth 2 (two) times daily. 60 tablet 0  . mirtazapine (REMERON) 30 MG tablet Take 1 tablet (30 mg total) by mouth at bedtime. 30 tablet 3  . pantoprazole (PROTONIX) 40 MG tablet Take 1 tablet (40 mg total) by mouth 2 (two) times daily before a meal. 60 tablet 1  . sulfamethoxazole-trimethoprim (BACTRIM DS) 800-160 MG tablet Take 1 tablet by mouth daily. 30 tablet 1  . tiotropium (SPIRIVA) 18 MCG inhalation capsule Place 1 capsule (18 mcg total) into inhaler and inhale daily. 30 capsule 6  . Valbenazine Tosylate (INGREZZA) 80 MG CAPS Take 80 mg by mouth 2 (two) times daily.     . metoprolol tartrate (LOPRESSOR) 25 MG tablet Take 1 tablet (25 mg total) by mouth 2 (two) times daily. 60 tablet 6  . ondansetron (ZOFRAN) 4 MG tablet Take 1 tablet (4 mg total) by mouth every 6 (six) hours. 12 tablet 0  . apixaban (ELIQUIS) 2.5 MG TABS tablet Take 1 tablet (2.5 mg total) by mouth 2 (two) times daily. Restart on 10/5 (Patient not taking: Reported on 06/29/2017)  0  . fluconazole (DIFLUCAN) 100 MG tablet Take 1 tablet (100 mg total) by mouth once a week. (Patient not taking: Reported on 06/29/2017) 30 tablet 0  . Multiple Vitamin (MULTIVITAMIN WITH MINERALS) TABS tablet Take 1 tablet by mouth daily. (Patient not taking: Reported on 06/29/2017)     No facility-administered medications prior to visit.     ROS Review of Systems  Constitutional: Negative for activity change and appetite change.  HENT: Negative for sinus pressure and sore throat.   Eyes: Negative for visual disturbance.  Respiratory: Negative for cough, chest tightness and shortness of breath.   Cardiovascular: Negative for chest pain and  leg swelling.  Gastrointestinal: Negative for abdominal distention, abdominal pain, constipation and diarrhea.  Endocrine: Negative.   Genitourinary: Negative for dysuria.  Musculoskeletal: Negative for joint swelling and myalgias.  Skin: Negative for rash.  Allergic/Immunologic: Negative.   Neurological: Negative for weakness, light-headedness and numbness.  Psychiatric/Behavioral: Positive for sleep disturbance. Negative for dysphoric mood and suicidal ideas.    Objective:  BP (!) 89/55   Pulse 63   Temp (!) 97.5 F (36.4 C) (Oral)   Ht '5\' 10"'$  (1.778 m)   Wt 127 lb 9.6 oz (57.9 kg)   SpO2 98%   BMI 18.31 kg/m   BP/Weight 06/29/2017 06/20/2017 12/25/270  Systolic BP 89 536 644  Diastolic BP 55 72 88  Wt. (Lbs) 127.6 131 -  BMI 18.31 18.8 -  Some encounter information is confidential and restricted. Go to Review Flowsheets activity to see all data.      Physical Exam  Constitutional: He is oriented to person, place, and time. He appears well-developed and well-nourished.  Cardiovascular: Normal rate, normal heart sounds and intact distal pulses.   No murmur heard.  Pulmonary/Chest: Effort normal and breath sounds normal. He has no wheezes. He has no rales. He exhibits no tenderness.  Abdominal: Soft. Bowel sounds are normal. He exhibits no distension and no mass. There is no tenderness.  Musculoskeletal: Normal range of motion.  Neurological: He is alert and oriented to person, place, and time. Coordination (tardive dyskinesia) abnormal.  Psychiatric: He has a normal mood and affect.     Assessment & Plan:   1. Acute deep vein thrombosis (DVT) of popliteal vein of both lower extremities (HCC) Status post IVC filter - Homocysteine  2. Duodenal ulcer with hemorrhage No acute bleed at this time Continue Protonix - CBC with Differential/Platelet - CMP14+EGFR  3. Alcohol use disorder, severe, dependence ([place]) Last drink was 3 weeks ago as per patient Continue AA  meetings  4. Paranoid schizophrenia, chronic condition ([place]) Encouraged to keep appointment Case manager and caseworker to see to this  5. Other insomnia Uncontrolled on Remeron Advised to increase gabapentin to 600 mg twice daily Might benefit from addition of trazodone if insomnia persists  6. Other specified hypotension Asymptomatic Reduce his metoprolol dose  7. Essential hypertension Blood pressure is on the soft side - metoprolol tartrate (LOPRESSOR) 25 MG tablet; Take 0.5 tablets (12.5 mg total) by mouth 2 (two) times daily.  Dispense: 30 tablet; Refill: 6  8. PE (pulmonary thromboembolism) ([place]) He was supposed to have completed 6 months of anticoagulation in 04/2017 This was unprovoked  Compliance is questionable Has been off Eliquis due to GI bleed We'll send a hypercoagulable panel to determine if he will be on long-term anticoagulation - Factor V Leiden - Homocysteine - Lupus anticoagulant - Protein C, total and functional panel - Protein S, total and free   Meds ordered this encounter  Medications  . metoprolol tartrate (LOPRESSOR) 25 MG tablet    Sig: Take 0.5 tablets (12.5 mg total) by mouth 2 (two) times daily.    Dispense:  30 tablet    Refill:  6    Discontinue previous dose    Follow-up: Return in about 2 weeks (around 07/13/2017) for TCC - follow-up on hypotension.   Arnoldo Morale MD

## 2017-07-02 LAB — SPECIMEN STATUS REPORT

## 2017-07-02 LAB — PROTEIN C, TOTAL: PROTEIN C ANTIGEN: 53 % — AB (ref 60–150)

## 2017-07-04 LAB — CMP14+EGFR
ALBUMIN: 3.8 g/dL (ref 3.5–5.5)
ALT: 10 IU/L (ref 0–44)
AST: 18 IU/L (ref 0–40)
Albumin/Globulin Ratio: 1.2 (ref 1.2–2.2)
Alkaline Phosphatase: 58 IU/L (ref 39–117)
BILIRUBIN TOTAL: 0.2 mg/dL (ref 0.0–1.2)
BUN/Creatinine Ratio: 14 (ref 9–20)
BUN: 11 mg/dL (ref 6–24)
CHLORIDE: 106 mmol/L (ref 96–106)
CO2: 20 mmol/L (ref 20–29)
Calcium: 8.9 mg/dL (ref 8.7–10.2)
Creatinine, Ser: 0.78 mg/dL (ref 0.76–1.27)
GFR, EST AFRICAN AMERICAN: 117 mL/min/{1.73_m2} (ref >59)
GFR, EST NON AFRICAN AMERICAN: 102 mL/min/{1.73_m2} (ref >59)
GLOBULIN, TOTAL: 3.1 g/dL (ref 1.5–4.5)
GLUCOSE: 75 mg/dL (ref 65–99)
POTASSIUM: 4.6 mmol/L (ref 3.5–5.2)
SODIUM: 140 mmol/L (ref 134–144)
Total Protein: 6.9 g/dL (ref 6.0–8.5)

## 2017-07-04 LAB — CBC WITH DIFFERENTIAL/PLATELET
BASOS ABS: 0 10*3/uL (ref 0.0–0.2)
Basos: 1 %
EOS (ABSOLUTE): 0.1 10*3/uL (ref 0.0–0.4)
EOS: 2 %
HEMOGLOBIN: 10 g/dL — AB (ref 13.0–17.7)
Hematocrit: 32 % — ABNORMAL LOW (ref 37.5–51.0)
Immature Grans (Abs): 0 10*3/uL (ref 0.0–0.1)
Immature Granulocytes: 0 %
LYMPHS ABS: 1.6 10*3/uL (ref 0.7–3.1)
LYMPHS: 41 %
MCH: 29.1 pg (ref 26.6–33.0)
MCHC: 31.3 g/dL — AB (ref 31.5–35.7)
MCV: 93 fL (ref 79–97)
MONOCYTES: 9 %
MONOS ABS: 0.3 10*3/uL (ref 0.1–0.9)
NEUTROS ABS: 1.8 10*3/uL (ref 1.4–7.0)
Neutrophils: 47 %
Platelets: 225 10*3/uL (ref 150–379)
RBC: 3.44 x10E6/uL — ABNORMAL LOW (ref 4.14–5.80)
RDW: 18.7 % — AB (ref 12.3–15.4)
WBC: 3.8 10*3/uL (ref 3.4–10.8)

## 2017-07-04 LAB — LUPUS ANTICOAGULANT
DPT CONFIRM RATIO: 0.76 ratio (ref 0.00–1.40)
DPT: 42.5 s (ref 0.0–55.0)
DRVVT: 32.3 s (ref 0.0–47.0)
PTT LA: 28.1 s (ref 0.0–51.9)
THROMBIN TIME: 16.4 s (ref 0.0–23.0)

## 2017-07-04 LAB — FACTOR V LEIDEN

## 2017-07-04 LAB — PROTEIN S, TOTAL AND FREE
Protein S Ag, Free: 61 % (ref 57–157)
Protein S Ag, Total: 66 % (ref 60–150)

## 2017-07-04 LAB — HOMOCYSTEINE: Homocysteine: 7.2 umol/L (ref 0.0–15.0)

## 2017-07-05 ENCOUNTER — Encounter: Payer: Self-pay | Admitting: Family Medicine

## 2017-07-05 ENCOUNTER — Telehealth: Payer: Self-pay

## 2017-07-05 DIAGNOSIS — D6859 Other primary thrombophilia: Secondary | ICD-10-CM | POA: Insufficient documentation

## 2017-07-05 NOTE — Telephone Encounter (Signed)
Pt was called and informed of lab results. 

## 2017-07-13 ENCOUNTER — Telehealth: Payer: Self-pay

## 2017-07-13 NOTE — Telephone Encounter (Signed)
Attempted to confirm the patient's appointment at [facility] on 07/16/17 @ 1400. Voicemail message left for [name] Herring # [phone] requesting a call back to # [phone].

## 2017-07-16 ENCOUNTER — Encounter: Payer: Self-pay | Admitting: Family Medicine

## 2017-07-16 ENCOUNTER — Telehealth: Payer: Self-pay

## 2017-07-16 ENCOUNTER — Ambulatory Visit: Payer: Medicare Other | Attending: Family Medicine | Admitting: Family Medicine

## 2017-07-16 VITALS — BP 93/46 | HR 53 | Temp 97.4°F | Ht 70.0 in | Wt 137.8 lb

## 2017-07-16 DIAGNOSIS — Z880 Allergy status to penicillin: Secondary | ICD-10-CM | POA: Diagnosis not present

## 2017-07-16 DIAGNOSIS — G4700 Insomnia, unspecified: Secondary | ICD-10-CM | POA: Insufficient documentation

## 2017-07-16 DIAGNOSIS — I959 Hypotension, unspecified: Secondary | ICD-10-CM | POA: Insufficient documentation

## 2017-07-16 DIAGNOSIS — G4709 Other insomnia: Secondary | ICD-10-CM

## 2017-07-16 DIAGNOSIS — B20 Human immunodeficiency virus [HIV] disease: Secondary | ICD-10-CM | POA: Insufficient documentation

## 2017-07-16 DIAGNOSIS — Z9889 Other specified postprocedural states: Secondary | ICD-10-CM | POA: Insufficient documentation

## 2017-07-16 DIAGNOSIS — I10 Essential (primary) hypertension: Secondary | ICD-10-CM | POA: Diagnosis not present

## 2017-07-16 DIAGNOSIS — D6859 Other primary thrombophilia: Secondary | ICD-10-CM | POA: Insufficient documentation

## 2017-07-16 DIAGNOSIS — F209 Schizophrenia, unspecified: Secondary | ICD-10-CM | POA: Diagnosis not present

## 2017-07-16 DIAGNOSIS — I82433 Acute embolism and thrombosis of popliteal vein, bilateral: Secondary | ICD-10-CM

## 2017-07-16 DIAGNOSIS — K264 Chronic or unspecified duodenal ulcer with hemorrhage: Secondary | ICD-10-CM | POA: Insufficient documentation

## 2017-07-16 DIAGNOSIS — Z79899 Other long term (current) drug therapy: Secondary | ICD-10-CM | POA: Diagnosis not present

## 2017-07-16 DIAGNOSIS — Z86718 Personal history of other venous thrombosis and embolism: Secondary | ICD-10-CM | POA: Diagnosis not present

## 2017-07-16 MED ORDER — TRAZODONE HCL 50 MG PO TABS: 50.0000 mg | ORAL_TABLET | Freq: Every day | ORAL | 3 refills | Status: DC

## 2017-07-16 MED ORDER — APIXABAN 2.5 MG PO TABS: 2.5000 mg | ORAL_TABLET | Freq: Two times a day (BID) | ORAL | 2 refills | Status: DC

## 2017-07-16 NOTE — Progress Notes (Signed)
Transitional care clinic   Subjective:  Patient ID: [name] Herring, male    DOB: [birth year]  Age: 56 y.o. MRN: [id number]  CC: Hypotension   HPI [name]  Is a 56 year old male with a history of hypertension, HIV (CD4 count of 470, viral load of less than 20 currently on antiretroviral therapy), bilateral submassive pulmonary embolism, acute DVT of branch of the R Gastrocnemius vein, left lower extremity acute mobile thrombus of the proximal popliteal vein,acute DVT of the  distal to mid posterior tibial vein with right heart strain, placement of IVC filter in 10/2016, GI bleed secondary to duodenal ulcer here for follow-up visit.  His dose of metoprolol was discontinued at his last visit his blood pressure due to hypotension and his blood pressure is still soft today; he endorses some dizziness . Hypercoagulable labs ordered at his last visit came back positive for protein C deficiency .  He denies abdominal pain, hematemesis or hematochezia and has an appointment scheduled with GI for follow-up of his duodenal bleed on 08/15/17. Has been compliant with his PPI and has since resumed his Eliquis.  He continues to complain of insomnia despite taking Remeron. His schizophrenia is managed by the ACT team at the [facility] .  Past Medical History:  Diagnosis Date  . Depression   . HIV (human immunodeficiency virus infection) ([place])   . Hypertension   . Immune deficiency disorder ([place])   . PE (pulmonary thromboembolism) ([place]) 2018  . Personality disorder ([place])   . Schizophrenia [facility])     Past Surgical History:  Procedure Laterality Date  . COLONOSCOPY WITH PROPOFOL N/A 06/15/2017   Procedure: COLONOSCOPY WITH PROPOFOL;  Surgeon: Irene Shipper, MD;  [facility]: [facility];  Service: Endoscopy;  Laterality: N/A;  . ESOPHAGOGASTRODUODENOSCOPY (EGD) WITH PROPOFOL N/A 06/15/2017   Procedure: ESOPHAGOGASTRODUODENOSCOPY (EGD) WITH PROPOFOL;  Surgeon: Irene Shipper, MD;  Location:  [facility];  Service: Endoscopy;  Laterality: N/A;  . IR GENERIC HISTORICAL  11/04/2016   IR IVC FILTER PLMT / S&I Burke Keels GUID/MOD SED 11/04/2016 Aletta Edouard, MD [facility]-INTERV RAD    Allergies  Allergen Reactions  . Amoxicillin Shortness Of Breath and Rash    Has patient had a PCN reaction causing immediate rash, facial/tongue/throat swelling, [name] Has patient had a PCN reaction causing severe rash involving mucus membranes or skin necrosis: no Has patient had a PCN reaction that required hospitalization: yes drs office visit Has patient had a PCN reaction occurring within the last 10 years: no If all of the above answers are "NO", then may proceed with Cephalosporin use.   . Latex Shortness Of Breath     Outpatient Medications Prior to Visit  Medication Sig Dispense Refill  . acetaminophen (TYLENOL) 500 MG tablet Take 1 tablet (500 mg total) by mouth every 6 (six) hours as needed for moderate pain. 30 tablet 0  . benztropine (COGENTIN) 1 MG tablet 1 tab in the morning 2 tabs at night 14 tablet 0  . elvitegravir-cobicistat-emtricitabine-tenofovir (GENVOYA) 150-150-200-10 MG TABS tablet TAKE 1 TABLET BY MOUTH DAILY WITH BREAKFAST. *CALL [phone] to see Dr Johnnye Sima ASAP* 30 tablet 1  . gabapentin (NEURONTIN) 600 MG tablet Take 600 mg by mouth at bedtime.    . haloperidol (HALDOL) 10 MG tablet Take 0.5 tablets (5 mg total) by mouth 2 (two) times daily.    . magnesium oxide (MAG-OX) 400 (241.3 Mg) MG tablet Take 1 tablet (400 mg total) by mouth 2 (  two) times daily. 60 tablet 0  . metoprolol tartrate (LOPRESSOR) 25 MG tablet Take 0.5 tablets (12.5 mg total) by mouth 2 (two) times daily. 30 tablet 6  . mirtazapine (REMERON) 30 MG tablet Take 1 tablet (30 mg total) by mouth at bedtime. 30 tablet 3  . pantoprazole (PROTONIX) 40 MG tablet Take 1 tablet (40 mg total) by mouth 2 (two) times daily before a meal. 60 tablet 1  . sulfamethoxazole-trimethoprim  (BACTRIM DS) 800-160 MG tablet Take 1 tablet by mouth daily. 30 tablet 1  . tiotropium (SPIRIVA) 18 MCG inhalation capsule Place 1 capsule (18 mcg total) into inhaler and inhale daily. 30 capsule 6  . Valbenazine Tosylate (INGREZZA) 80 MG CAPS Take 80 mg by mouth 2 (two) times daily.     . fluconazole (DIFLUCAN) 100 MG tablet Take 1 tablet (100 mg total) by mouth once a week. (Patient not taking: Reported on 06/29/2017) 30 tablet 0  . Multiple Vitamin (MULTIVITAMIN WITH MINERALS) TABS tablet Take 1 tablet by mouth daily. (Patient not taking: Reported on 06/29/2017)    . apixaban (ELIQUIS) 2.5 MG TABS tablet Take 1 tablet (2.5 mg total) by mouth 2 (two) times daily. Restart on 10/5 (Patient not taking: Reported on 06/29/2017)  0   No facility-administered medications prior to visit.     ROS Review of Systems  Constitutional: Negative for activity change and appetite change.  HENT: Negative for sinus pressure and sore throat.   Eyes: Negative for visual disturbance.  Respiratory: Negative for cough, chest tightness and shortness of breath.   Cardiovascular: Negative for chest pain and leg swelling.  Gastrointestinal: Negative for abdominal distention, abdominal pain, constipation and diarrhea.  Endocrine: Negative.   Genitourinary: Negative for dysuria.  Musculoskeletal: Negative for joint swelling and myalgias.  Skin: Negative for rash.  Allergic/Immunologic: Negative.   Neurological: Positive for light-headedness. Negative for weakness and numbness.  Psychiatric/Behavioral: Positive for sleep disturbance. Negative for dysphoric mood and suicidal ideas.    Objective:  BP (!) 93/46   Pulse (!) 53   Temp (!) 97.4 F (36.3 C) (Oral)   Ht 5\' 10"  (1.778 m)   Wt 137 lb 12.8 oz (62.5 kg)   SpO2 99%   BMI 19.77 kg/m   BP/Weight 07/16/2017 06/29/2017 0/25/4270  Systolic BP 93 89 623  Diastolic BP 46 55 72  Wt. (Lbs) 137.8 127.6 131  BMI 19.77 18.31 18.8  Some encounter information is  confidential and restricted. Go to Review Flowsheets activity to see all data.     Physical Exam  Constitutional: He is oriented to person, place, and time. He appears well-developed and well-nourished.  Cardiovascular: Normal rate, normal heart sounds and intact distal pulses.   No murmur heard. Pulmonary/Chest: Effort normal and breath sounds normal. He has no wheezes. He has no rales. He exhibits no tenderness.  Abdominal: Soft. Bowel sounds are normal. He exhibits no distension and no mass. There is no tenderness.  Musculoskeletal: Normal range of motion.  Neurological: He is alert and oriented to person, place, and time. Coordination (Tardive dyskinesis.) abnormal.  Psychiatric: He has a normal mood and affect.     Assessment & Plan:   1. Essential hypertension Blood pressure is on the soft side - does have symptomatic hypotension Advised to hold off on metoprolol We'll check blood pressure at next visit  2. Protein C deficiency ([place]) Will need lifelong anticoagulation - apixaban (ELIQUIS) 2.5 MG TABS tablet; Take 1 tablet (2.5 mg total) by mouth 2 (two)  times daily.  Dispense: 60 tablet; Refill: 2  3. Acute deep vein thrombosis (DVT) of popliteal vein of both lower extremities (HCC) Chronic anticoagulation due to protein C deficiency - apixaban (ELIQUIS) 2.5 MG TABS tablet; Take 1 tablet (2.5 mg total) by mouth 2 (two) times daily.  Dispense: 60 tablet; Refill: 2  4. Duodenal ulcer with hemorrhage No recent bleed Keep appointment with GI which comes up next month  5. Other insomnia Uncontrolled on Remeron Trazodone added - traZODone (DESYREL) 50 MG tablet; Take 1 tablet (50 mg total) by mouth at bedtime.  Dispense: 30 tablet; Refill: 3  6. Schizophrenia Managed by the [employer] ACT team  Meds ordered this encounter  Medications  . apixaban (ELIQUIS) 2.5 MG TABS tablet    Sig: Take 1 tablet (2.5 mg total) by mouth 2 (two) times daily.    Dispense:  60 tablet      Refill:  2    Must have office visit for refills  . traZODone (DESYREL) 50 MG tablet    Sig: Take 1 tablet (50 mg total) by mouth at bedtime.    Dispense:  30 tablet    Refill:  3    Follow-up: Return in about 6 weeks (around 08/27/2017) for Follow-up of hypotension.   Arnoldo Morale MD

## 2017-07-16 NOTE — Patient Instructions (Signed)

## 2017-07-16 NOTE — Telephone Encounter (Signed)
Met with Starr Lake, Bridge Counselor, when he was in the clinic today with the patient.  He confirmed that the patient is followed by an  Copywriter, advertising from [employer] who delivers the patient's  behavioral health medications.

## 2017-07-17 DIAGNOSIS — R404 Transient alteration of awareness: Secondary | ICD-10-CM | POA: Diagnosis not present

## 2017-07-17 DIAGNOSIS — R42 Dizziness and giddiness: Secondary | ICD-10-CM | POA: Diagnosis not present

## 2017-07-18 ENCOUNTER — Encounter: Payer: Self-pay | Admitting: Infectious Diseases

## 2017-07-18 ENCOUNTER — Ambulatory Visit (INDEPENDENT_AMBULATORY_CARE_PROVIDER_SITE_OTHER): Payer: Medicare Other | Admitting: Infectious Diseases

## 2017-07-18 VITALS — BP 121/67 | HR 78 | Temp 98.7°F | Ht 70.0 in | Wt 137.0 lb

## 2017-07-18 DIAGNOSIS — B20 Human immunodeficiency virus [HIV] disease: Secondary | ICD-10-CM

## 2017-07-18 DIAGNOSIS — R634 Abnormal weight loss: Secondary | ICD-10-CM | POA: Diagnosis not present

## 2017-07-18 DIAGNOSIS — F32A Depression, unspecified: Secondary | ICD-10-CM

## 2017-07-18 DIAGNOSIS — F329 Major depressive disorder, single episode, unspecified: Secondary | ICD-10-CM

## 2017-07-18 NOTE — Patient Instructions (Addendum)
Stop your Bactrim for now.   Will check your lab work today.   Will see you back in 2 months with labs 2 weeks before. Lab visit on 08/28/17.

## 2017-07-18 NOTE — Assessment & Plan Note (Signed)
Improved with increased Remeron. Will continue this for him.

## 2017-07-18 NOTE — Assessment & Plan Note (Signed)
VL was detectable last visit - will repeat today along with CD4 count. CD4 has been > 200 for 2 months now. With his adherence will stop Bactrim prophylaxis today. Will have him back in 8 weeks for follow up. He also has PCP visit in between appointments with me. Continue to work with [employer].

## 2017-07-18 NOTE — Assessment & Plan Note (Signed)
Improving with increased Remeron dose. With new Protein C deficiency diagnosis I would not use Megace in this patient as he is hypercoagulable with h/o PE. If Remeron does not work I will consider marinol as alternative.

## 2017-07-18 NOTE — Progress Notes (Signed)
Brief Narrative: [name] Herring is a patient with a history of HIV (08/18/1998) previously followed at [facility] with last visit 2015. Was seen by Dr. Johnnye Sima in Feb 2018. History of ETOH/cocaine abuse, previously homeless. Previous ART includes Kaletra + Combivir. Now on Genvoya.   PCP - Arnoldo Morale, MD    Patient Active Problem List   Diagnosis Date Noted  . Protein C deficiency ([place]) 07/05/2017  . Benign neoplasm of transverse colon   . Duodenal ulcer with hemorrhage   . Unintentional weight loss 06/14/2017  . Encounter for palliative care   . Coagulopathy ([place])   . Abnormal CT of the abdomen   . AKI (acute kidney injury) ([place]) 06/12/2017  . Chest pain 06/12/2017  . Emphysema lung ([place]) 11/20/2016  . DVT of lower extremity, bilateral ([place]) 11/04/2016  . Tobacco abuse 11/02/2016  . PE (pulmonary thromboembolism) ([place]) 11/02/2016  . Cocaine dependence with cocaine-induced mood disorder ([place]) 03/29/2016  . HTN (hypertension) 08/25/2015  . Paranoid schizophrenia, chronic condition ([place]) 12/30/2014  . Alcohol use disorder, severe, dependence ([place]) 12/27/2014  . ERECTILE DYSFUNCTION 09/03/2007  . Human immunodeficiency virus (HIV) disease ([place]) 11/09/2006  . CA IN SITU, RECTUM 11/09/2006  . Hypothyroidism 11/09/2006  . DISORDER, BIPOLAR NOS 11/09/2006  . Depression 11/09/2006  . PYELONEPHRITIS 11/09/2006    Patient's Medications  New Prescriptions   No medications on file  Previous Medications   ACETAMINOPHEN (TYLENOL) 500 MG TABLET    Take 1 tablet (500 mg total) by mouth every 6 (six) hours as needed for moderate pain.   APIXABAN (ELIQUIS) 2.5 MG TABS TABLET    Take 1 tablet (2.5 mg total) by mouth 2 (two) times daily.   BENZTROPINE (COGENTIN) 1 MG TABLET    1 tab in the morning 2 tabs at night   ELVITEGRAVIR-COBICISTAT-EMTRICITABINE-TENOFOVIR (GENVOYA) 150-150-200-10 MG TABS TABLET    TAKE 1 TABLET BY MOUTH DAILY WITH BREAKFAST. *CALL [phone] to see Dr Johnnye Sima  ASAP*   FLUCONAZOLE (DIFLUCAN) 100 MG TABLET    Take 1 tablet (100 mg total) by mouth once a week.   GABAPENTIN (NEURONTIN) 600 MG TABLET    Take 600 mg by mouth at bedtime.   HALOPERIDOL (HALDOL) 10 MG TABLET    Take 0.5 tablets (5 mg total) by mouth 2 (two) times daily.   LISINOPRIL (PRINIVIL,ZESTRIL) 10 MG TABLET    Take 10 mg by mouth daily.   MAGNESIUM OXIDE (MAG-OX) 400 (241.3 MG) MG TABLET    Take 1 tablet (400 mg total) by mouth 2 (two) times daily.   METOPROLOL TARTRATE (LOPRESSOR) 25 MG TABLET    Take 0.5 tablets (12.5 mg total) by mouth 2 (two) times daily.   MIRTAZAPINE (REMERON) 30 MG TABLET    Take 1 tablet (30 mg total) by mouth at bedtime.   MULTIPLE VITAMIN (MULTIVITAMIN WITH MINERALS) TABS TABLET    Take 1 tablet by mouth daily.   PANTOPRAZOLE (PROTONIX) 40 MG TABLET    Take 1 tablet (40 mg total) by mouth 2 (two) times daily before a meal.   POLYETHYLENE GLYCOL POWDER (GLYCOLAX/MIRALAX) POWDER    mix with a liquid AND drink daily   SULFAMETHOXAZOLE-TRIMETHOPRIM (BACTRIM DS) 800-160 MG TABLET    Take 1 tablet by mouth daily.   TIOTROPIUM (SPIRIVA) 18 MCG INHALATION CAPSULE    Place 1 capsule (18 mcg total) into inhaler and inhale daily.   TRAZODONE (DESYREL) 50 MG TABLET    Take 1 tablet (50 mg total)  by mouth at bedtime.   VALBENAZINE TOSYLATE (INGREZZA) 80 MG CAPS    Take 80 mg by mouth 2 (two) times daily.   Modified Medications   No medications on file  Discontinued Medications   No medications on file    Subjective: [name] Herring presents to clinic today for routine follow up care for his HIV infection.  HPI:  HIV =  Currently on a regimen of Genvoya and reports good adherence with it. He tells me he has been highly adherent since his last visit and is proud of himself. He has regained 10 lbs. Endorses no complaints today suggestive of associated opportunistic infection or advancing HIV disease such as fevers, night sweats, weight loss, anorexia, cough, SOB, nausea,  vomiting, diarrhea, headache, sensory changes, lymphadenopathy or oral thrush.   Weight Loss = regained 10 lbs with improved appetite with Remeron 30 mg daily.   Depression = improved. Has also improved alcohol intake with last drink about a month ago. Reported some minor signs of withdrawal with tremors but nothing significant per his account.   Depression screen PHQ 2/9 07/18/2017  Decreased Interest 0  Down, Depressed, Hopeless 0  PHQ - 2 Score 0  Altered sleeping -  Tired, decreased energy -  Change in appetite -  Feeling bad or failure about yourself  -  Trouble concentrating -  Moving slowly or fidgety/restless -  Suicidal thoughts -  PHQ-9 Score -     Review of Systems: Review of Systems  Constitutional: Positive for weight loss. Negative for chills, fever and malaise/fatigue.  HENT:       No dental problems  Respiratory: Negative for cough and sputum production.   Cardiovascular: Negative for chest pain and leg swelling.  Gastrointestinal: Negative for abdominal pain, blood in stool, diarrhea and vomiting.  Skin: Negative for rash.  Neurological: Positive for weakness. Negative for dizziness, tingling and headaches.  Psychiatric/Behavioral: Positive for depression and substance abuse (ETOH ). The patient is not nervous/anxious and does not have insomnia.     Past Medical History:  Diagnosis Date  . Depression   . HIV (human immunodeficiency virus infection) ([place])   . Hypertension   . Immune deficiency disorder ([place])   . PE (pulmonary thromboembolism) ([place]) 2018  . Personality disorder ([place])   . Schizophrenia ([place])    Allergies  Allergen Reactions  . Amoxicillin Shortness Of Breath and Rash    Has patient had a PCN reaction causing immediate rash, facial/tongue/throat swelling, [name] Has patient had a PCN reaction causing severe rash involving mucus membranes or skin necrosis: no Has patient had a PCN reaction that  required hospitalization: yes drs office visit Has patient had a PCN reaction occurring within the last 10 years: no If all of the above answers are "NO", then may proceed with Cephalosporin use.   . Latex Shortness Of Breath    Objective:  Vitals:   07/18/17 1508  BP: 121/67  Pulse: 78  Temp: 98.7 F (37.1 C)  TempSrc: Oral  Weight: 137 lb (62.1 kg)  Height: 5\' 10"  (1.778 m)   Body mass index is 19.66 kg/m.  Physical Exam  Constitutional: He is oriented to person, place, and time.  Thin AA male. Smiling making good eye contact today. In good spirits. Appears to have put on some weight.   HENT:  Mouth/Throat: Oropharynx is clear and moist.  Eyes: No scleral icterus.  Cardiovascular: Normal rate, regular rhythm and normal heart sounds.  Pulmonary/Chest: Effort normal and breath sounds normal. No respiratory distress.  Abdominal: Soft. Bowel sounds are normal. He exhibits no distension. There is no tenderness.  Lymphadenopathy:    He has no cervical adenopathy.  Neurological: He is alert and oriented to person, place, and time. He has normal strength.  Shuffled gait  Skin: Skin is warm and dry.  Psychiatric: Mood, affect and judgment normal.    Lab Results Lab Results  Component Value Date   WBC 3.8 06/29/2017   HGB 10.0 (L) 06/29/2017   HCT 32.0 (L) 06/29/2017   MCV 93 06/29/2017   PLT 225 06/29/2017    Lab Results  Component Value Date   CREATININE 0.78 06/29/2017   BUN 11 06/29/2017   NA 140 06/29/2017   K 4.6 06/29/2017   CL 106 06/29/2017   CO2 20 06/29/2017    Lab Results  Component Value Date   ALT 10 06/29/2017   AST 18 06/29/2017   ALKPHOS 58 06/29/2017   BILITOT 0.2 06/29/2017    Lab Results  Component Value Date   CHOL 153 08/05/2015   HDL 93 08/05/2015   LDLCALC 30 08/05/2015   TRIG 151 (H) 08/05/2015   CHOLHDL 1.6 08/05/2015   HIV 1 RNA Quant (copies/mL)  Date Value  06/20/2017 63 (H)  11/13/2016 152 (H)  07/16/2016 <20   CD4 T  Cell Abs (/uL)  Date Value  06/20/2017 300 (L)  06/14/2017 220 (L)  11/13/2016 290 (L)   No results found for: HAV Lab Results  Component Value Date   HEPBSAG Negative 09/11/2007   HEPBSAB Undetermined 09/11/2007   Lab Results  Component Value Date   HCVAB Negative 09/11/2007   Lab Results  Component Value Date   CHLAMYDIAWP Negative 01/13/2015   N Negative 01/13/2015   No results found for: GCPROBEAPT No results found for: QUANTGOLD No results found for: RPR    Problem List Items Addressed This Visit      Other   Depression    Improved with increased Remeron. Will continue this for him.       Human immunodeficiency virus (HIV) disease ([place]) (Chronic)    VL was detectable last visit - will repeat today along with CD4 count. CD4 has been > 200 for 2 months now. With his adherence will stop Bactrim prophylaxis today. Will have him back in 8 weeks for follow up. He also has PCP visit in between appointments with me. Continue to work with [employer].       Unintentional weight loss    Improving with increased Remeron dose. With new Protein C deficiency diagnosis I would not use Megace in this patient as he is hypercoagulable with h/o PE. If Remeron does not work I will consider marinol as alternative.        Other Visit Diagnoses    HIV (human immunodeficiency virus infection) ([place])    -  Primary   Relevant Orders   HIV 1 RNA quant-no reflex-bld   T-helper cell (CD4)- ([facility] only)   HIV 1 RNA quant-no reflex-bld   T-helper cell (CD4)- ([facility] clinic only)      Janene Madeira, MSN, NP-C [facility] [place] Pager: [phone]  07/18/17 4:00 PM

## 2017-07-20 ENCOUNTER — Other Ambulatory Visit: Payer: Self-pay | Admitting: Infectious Diseases

## 2017-07-20 DIAGNOSIS — B20 Human immunodeficiency virus [HIV] disease: Secondary | ICD-10-CM

## 2017-07-20 LAB — T-HELPER CELL (CD4) - (RCID CLINIC ONLY)
CD4 % Helper T Cell: 25 % — ABNORMAL LOW (ref 33–55)
CD4 T CELL ABS: 280 /uL — AB (ref 400–2700)

## 2017-07-20 LAB — HIV-1 RNA QUANT-NO REFLEX-BLD
HIV 1 RNA QUANT: 21 {copies}/mL — AB
HIV-1 RNA QUANT, LOG: 1.32 {Log_copies}/mL — AB

## 2017-07-24 NOTE — Patient Instructions (Signed)
RN met with client for assessment. RN reviewed Agency Services, [place] Notice of Privacy Policy, Home Safety Management Information Booklet. Home Fire Safety Assessment, Fall Risk Assessment and Suicide Risk Assessment was performed. RN also discussed information on a Living Will, Advanced Directives, and Health Care Power of Attorney. RN and Client/Designated Party educated/reviewed/signed Client Agreement and Consent for Service form along with Patient Rights and Responsibilities statement. RN developed patient specific and centered care plan. RN provided contact information and reviewed how to receive emergency help after hours for schedule changes, billing questions, reporting of safety issues, falls, concerns or any needs/questions. Standard Precaution and Infection control along with interventions to correct or prevent high risk behaviors instructed to the patient. Client/Caregiver reports understanding and agreement with the above 

## 2017-07-24 NOTE — Progress Notes (Signed)
Rn made a home visit and found the patient to be home. RN explained the reason for my visit and that  Iwould love to offer him assistance with medication adherence. [name] Herring stated he knows how to take his medication and it comes in a bubble package from the pharmacy. [name] Herring stated even though he knows how to take his medications he would not mind having someone come over and check on him at times. RN offered that services and consents were reviewed and signed at this time.   Order received on 06/13/17 by Cr Megan Salon to evaluate patient for [facility]). Initial contact with attempted on 06/13/17 with 1st home visit on 06/20/17 after Discharge from the hospitalPatient was consented to care at this time.  Initial Assessment Points to Consider for Care  Points are not all inclusive to services and educated provided but supports the patient's Individualized Plan of Care.  . Is Home safe for visits? Yes / Are all firearms or weapons secure? yes . Insurance Coverage: Medicare . 1st HIV Diagnosis: Nov 1999 . Mode of HIV Transmission: MSM . Functional Status: able to perform ADL's independently with additional time given . Current Housing/Needs: no housing needs at this time, lives in an apartment alone  . Social Support/System: Dtr who lives in [place] in [place]) no longer sister(Barbara) . Culture/Religion/Spirituality:none noted that would create barriers to care . Educational Background:  . Legal Issues: none noted . Access/Utilization of Community Resources:27% no show rate . Mental Health Concerns/Diagnosis: Schizoaffective disorder . Alcohol and/or Drug Use: Hx of cocaine abuse . Risk and Knowledge of HIV and Reduction in Transmission:  Very knowledgeable able mode of transmission and ways to reduce transmission Nutritional Needs: not at this time    Frequency / Duration of [facility] visits: Effective 06/20/17 68mo1, 6mo2, 15mo1  4PRN's for  complications with disease process/progression, medication changes or concerns   [facility] will assess for learning needs related to diagnosis and treatment regimen, provide education as needed, fill pill box if needed, address any barriers which may be preventing medication compliance, and communicating with care team including physician and case manager.   Individualized Plan Of Care with Certification Period from 06/20/17 to 09/18/17  a. Type of service(s) and care to be delivered: RN Case Management  b. Frequency and duration of service: Effective 06/20/17 48mo1, 5mo2, 44mo1, 4 prns for complications with disease process/progression, medication changes or concerns . Visits/Contact may be conducted telephonically or in person to  best suit the patient.  c. Activity restrictions: Pt may be up as tolerated and can safely ambulate without the need for a assistive device. Additional time does need to be given to complete task   d. Safety Measures: Standard Precautions/Infection Control   e. Service Objectives and Goals: Service Objectives are to assist the pt with HIV medication regimen adherence and staying in care with the Infectious  Disease Clinic by identifying barriers to care. RN will address the barriers  that are identified by the patient. Current the patient is capable to taking his own medications but states he would like help and reminders with his care.    f. Equipment required: No additional equipment needs at this time   g. Functional Limitations: Vision. Pt has corrective glasses that he wears   h. Rehabilitation potential: Guarded   i. Diet and Nutritional Needs: Regular Diet   j. Medications and treatments: Medications have been reconciled and reviewed and are a part of EPIC electronic  file   k. Specific therapies if needed: RN   l. Pertinent diagnoses: HIV disease,  Hx of medication NonCompliance, Schizoaffective disorder  m. Expected outcome: Guarded

## 2017-07-24 NOTE — Progress Notes (Signed)
RN received a referral to engage with the patient and offer assistance with medication adherence. Arrived at the hospital but at this time the patient was not alert enough to sign a consent. Ms. [name] Herring) was present and concerned about the patient's prognosis and POA documentation. RN spent time answering the family questions and a call was placed to the pallative care MD for further explanations of the patient's prognosis and current condition. At this time the consent was not signed due to the patient's inability to consent for care and when he is discharged from the hospital the plan is to return to his own home and live independently

## 2017-07-30 NOTE — Progress Notes (Signed)
Rn made a home visit with Thayer Jew. Together we reviewed his medications and I reminded him that he his NOT to take his Eliquis until 10/5. Welton verbalized understanding and I showed him the exact medications that he is not to take. Jonavan stated he has medications that are due to be delivered today. Elster stated that the medications come through the mail which sounds different but I checked the mail for Iori. The medications were not there but I did get his mail for him to save him a trip. Shantel does have medications in his home at this time. He denies any falls of bloody stools. Fall prevention and emergency plan discussed during today's visit

## 2017-08-02 ENCOUNTER — Telehealth: Payer: Self-pay | Admitting: *Deleted

## 2017-08-02 NOTE — Telephone Encounter (Signed)
RN contacted the patient as an attempt to stay connected/engaged and to arrange a home visit. Patient's VM has not been set up and I did not receive an answer. Will attempt to reach the patient again tomorrow

## 2017-08-03 ENCOUNTER — Telehealth: Payer: Self-pay | Admitting: *Deleted

## 2017-08-03 NOTE — Telephone Encounter (Signed)
Contacted [name] Herring and did not receive an answer. [name] Herring returned my call and stating he is doing well. Today is not a good day for a home visit but stated I could call him back on Monday to arrange a home visit.

## 2017-08-15 ENCOUNTER — Ambulatory Visit: Payer: Self-pay | Admitting: Internal Medicine

## 2017-08-20 ENCOUNTER — Encounter: Payer: Self-pay | Admitting: Infectious Diseases

## 2017-08-24 NOTE — Addendum Note (Signed)
Addended by: Arnoldo Morale on: 08/24/2017 01:23 PM   Modules accepted: Level of Service

## 2017-08-28 ENCOUNTER — Encounter: Payer: Self-pay | Admitting: Family Medicine

## 2017-08-28 ENCOUNTER — Ambulatory Visit: Payer: Medicare Other | Attending: Family Medicine | Admitting: Family Medicine

## 2017-08-28 VITALS — BP 101/47 | HR 64 | Temp 98.0°F | Ht 70.0 in | Wt 141.4 lb

## 2017-08-28 DIAGNOSIS — I82433 Acute embolism and thrombosis of popliteal vein, bilateral: Secondary | ICD-10-CM

## 2017-08-28 DIAGNOSIS — Z9889 Other specified postprocedural states: Secondary | ICD-10-CM | POA: Insufficient documentation

## 2017-08-28 DIAGNOSIS — Z91040 Latex allergy status: Secondary | ICD-10-CM | POA: Diagnosis not present

## 2017-08-28 DIAGNOSIS — D6859 Other primary thrombophilia: Secondary | ICD-10-CM | POA: Diagnosis not present

## 2017-08-28 DIAGNOSIS — B20 Human immunodeficiency virus [HIV] disease: Secondary | ICD-10-CM | POA: Diagnosis not present

## 2017-08-28 DIAGNOSIS — Z79899 Other long term (current) drug therapy: Secondary | ICD-10-CM | POA: Diagnosis not present

## 2017-08-28 DIAGNOSIS — G4700 Insomnia, unspecified: Secondary | ICD-10-CM | POA: Insufficient documentation

## 2017-08-28 DIAGNOSIS — Z880 Allergy status to penicillin: Secondary | ICD-10-CM | POA: Insufficient documentation

## 2017-08-28 DIAGNOSIS — I9589 Other hypotension: Secondary | ICD-10-CM

## 2017-08-28 DIAGNOSIS — Z7901 Long term (current) use of anticoagulants: Secondary | ICD-10-CM | POA: Insufficient documentation

## 2017-08-28 DIAGNOSIS — I10 Essential (primary) hypertension: Secondary | ICD-10-CM | POA: Diagnosis not present

## 2017-08-28 MED ORDER — APIXABAN 2.5 MG PO TABS: 2.5000 mg | ORAL_TABLET | Freq: Two times a day (BID) | ORAL | 2 refills | Status: DC

## 2017-08-28 NOTE — Progress Notes (Signed)
Subjective:  Patient ID: [name] Herring, male    DOB: September 15, 1961  Age: 56 y.o. MRN: [id number]  CC: Hypotension   HPI [name] Is a 56 year old male with a history of hypertension, HIV (CD4 count of 470, viral load of less than 20 currently on antiretroviral therapy), bilateral submassive pulmonary embolism, acute DVT of branch of the R Gastrocnemius vein, left lower extremity acute mobile thrombus of the proximal popliteal vein,acute DVT of the  distal to mid posterior tibial vein with right heart strain, placement of IVC filter in 10/2016, protein C deficiency, GI bleed secondary to duodenal ulcer here for follow-up visit.  At his last visit he was hypotensive and his metoprolol was discontinued. His blood pressure has improved today and he denies dizziness and is tolerating all his other medications. He was also commenced on trazodone for insomnia and reports improvement since commencement.  He has been compliant with his Eliquis and denies bleeding  [facility]  Past Medical History:  Diagnosis Date  . Depression   . HIV (human immunodeficiency virus infection) ([place])   . Hypertension   . Immune deficiency disorder ([place])   . PE (pulmonary thromboembolism) ([place]) 2018  . Personality disorder ([place])   . Schizophrenia [facility])     Past Surgical History:  Procedure Laterality Date  . COLONOSCOPY WITH PROPOFOL N/A 06/15/2017   Procedure: COLONOSCOPY WITH PROPOFOL;  Surgeon: Irene Shipper, MD;  Location: [facility];  Service: Endoscopy;  Laterality: N/A;  . ESOPHAGOGASTRODUODENOSCOPY (EGD) WITH PROPOFOL N/A 06/15/2017   Procedure: ESOPHAGOGASTRODUODENOSCOPY (EGD) WITH PROPOFOL;  Surgeon: Irene Shipper, MD;  [facility];  Service: Endoscopy;  Laterality: N/A;  . IR GENERIC HISTORICAL  11/04/2016   IR IVC FILTER PLMT / S&I Burke Keels GUID/MOD SED 11/04/2016 Aletta Edouard, MD [facility]-INTERV RAD    Allergies  Allergen Reactions  .  Amoxicillin Shortness Of Breath and Rash    Has patient had a PCN reaction causing immediate rash, facial/tongue/throat swelling, [name] Has patient had a PCN reaction causing severe rash involving mucus membranes or skin necrosis: no Has patient had a PCN reaction that required hospitalization: yes drs office visit Has patient had a PCN reaction occurring within the last 10 years: no If all of the above answers are "NO", then may proceed with Cephalosporin use.   . Latex Shortness Of Breath      Outpatient Medications Prior to Visit  Medication Sig Dispense Refill  . acetaminophen (TYLENOL) 500 MG tablet Take 1 tablet (500 mg total) by mouth every 6 (six) hours as needed for moderate pain. 30 tablet 0  . benztropine (COGENTIN) 1 MG tablet 1 tab in the morning 2 tabs at night 14 tablet 0  . elvitegravir-cobicistat-emtricitabine-tenofovir (GENVOYA) 150-150-200-10 MG TABS tablet TAKE 1 TABLET BY MOUTH DAILY WITH BREAKFAST. *CALL [phone] to see Dr Johnnye Sima ASAP* 30 tablet 1  . gabapentin (NEURONTIN) 600 MG tablet Take 600 mg by mouth at bedtime.    . GENVOYA 150-150-200-10 MG TABS tablet TAKE ONE TABLET BY MOUTH DAILY WITH BREAKFAST 30 tablet 6  . haloperidol (HALDOL) 10 MG tablet Take 0.5 tablets (5 mg total) by mouth 2 (two) times daily.    Marland Kitchen lisinopril (PRINIVIL,ZESTRIL) 10 MG tablet Take 10 mg by mouth daily.  1  . magnesium oxide (MAG-OX) 400 (241.3 Mg) MG tablet Take 1 tablet (400 mg total) by mouth 2 (two) times daily. 60 tablet 0  .  metoprolol tartrate (LOPRESSOR) 25 MG tablet Take 0.5 tablets (12.5 mg total) by mouth 2 (two) times daily. 30 tablet 6  . mirtazapine (REMERON) 30 MG tablet Take 1 tablet (30 mg total) by mouth at bedtime. 30 tablet 3  . Multiple Vitamin (MULTIVITAMIN WITH MINERALS) TABS tablet Take 1 tablet by mouth daily.    . pantoprazole (PROTONIX) 40 MG tablet Take 1 tablet (40 mg total) by mouth 2 (two) times daily before a  meal. 60 tablet 1  . polyethylene glycol powder (GLYCOLAX/MIRALAX) powder mix with a liquid AND drink daily  1  . sulfamethoxazole-trimethoprim (BACTRIM DS) 800-160 MG tablet Take 1 tablet by mouth daily. 30 tablet 1  . traZODone (DESYREL) 50 MG tablet Take 1 tablet (50 mg total) by mouth at bedtime. 30 tablet 3  . Valbenazine Tosylate (INGREZZA) 80 MG CAPS Take 80 mg by mouth 2 (two) times daily.     Marland Kitchen apixaban (ELIQUIS) 2.5 MG TABS tablet Take 1 tablet (2.5 mg total) by mouth 2 (two) times daily. 60 tablet 2  . fluconazole (DIFLUCAN) 100 MG tablet Take 1 tablet (100 mg total) by mouth once a week. (Patient not taking: Reported on 06/29/2017) 30 tablet 0  . tiotropium (SPIRIVA) 18 MCG inhalation capsule Place 1 capsule (18 mcg total) into inhaler and inhale daily. (Patient not taking: Reported on 07/18/2017) 30 capsule 6   No facility-administered medications prior to visit.     ROS Review of Systems  Constitutional: Negative for activity change and appetite change.  HENT: Negative for sinus pressure and sore throat.   Eyes: Negative for visual disturbance.  Respiratory: Negative for cough, chest tightness and shortness of breath.   Cardiovascular: Negative for chest pain and leg swelling.  Gastrointestinal: Negative for abdominal distention, abdominal pain, constipation and diarrhea.  Endocrine: Negative.   Genitourinary: Negative for dysuria.  Musculoskeletal: Negative for joint swelling and myalgias.  Skin: Negative for rash.  Allergic/Immunologic: Negative.   Neurological: Negative for weakness, light-headedness and numbness.  Psychiatric/Behavioral: Negative for dysphoric mood and suicidal ideas.    Objective:  BP (!) 101/47   Pulse 64   Temp 98 F (36.7 C) (Oral)   Ht 5\' 10"  (1.778 m)   Wt 141 lb 6.4 oz (64.1 kg)   SpO2 98%   BMI 20.29 kg/m   BP/Weight 08/28/2017 07/18/2017 62/13/0865  Systolic BP 784 696 93  Diastolic BP 47 67 46  Wt. (Lbs) 141.4 137 137.8  BMI 20.29  19.66 19.77  Some encounter information is confidential and restricted. Go to Review Flowsheets activity to see all data.      Physical Exam  Constitutional: He is oriented to person, place, and time. He appears well-developed and well-nourished.  Cardiovascular: Normal rate, normal heart sounds and intact distal pulses.  No murmur heard. Pulmonary/Chest: Effort normal and breath sounds normal. He has no wheezes. He has no rales. He exhibits no tenderness.  Abdominal: Soft. Bowel sounds are normal. He exhibits no distension and no mass. There is no tenderness.  Musculoskeletal: Normal range of motion.  Neurological: He is alert and oriented to person, place, and time.  Tardive dyskinesia  Psychiatric: He has a normal mood and affect.     Assessment & Plan:   1. Protein C deficiency ([place]) He will remain on chronic anticoagulation He was discharged from hospitalization on a low-dose of Eliquis 2.5 mg (indicated for weight of less than 60 kg); his weight has fluctuated between 60 and 64 kg.  Will consider increasing  dose of 5 mg twice daily at next visit if he picks up some weight. - apixaban (ELIQUIS) 2.5 MG TABS tablet; Take 1 tablet (2.5 mg total) by mouth 2 (two) times daily.  Dispense: 60 tablet; Refill: 2  2. Acute deep vein thrombosis (DVT) of popliteal vein of both lower extremities (HCC) Asymptomatic - apixaban (ELIQUIS) 2.5 MG TABS tablet; Take 1 tablet (2.5 mg total) by mouth 2 (two) times daily.  Dispense: 60 tablet; Refill: 2  3. Human immunodeficiency virus (HIV) disease ([place]) [place] antiretrovirals therapy Keep appointment with infectious disease  4. Other specified hypotension Controlled Continue Levothyroxine    Meds ordered this encounter  Medications  . apixaban (ELIQUIS) 2.5 MG TABS tablet    Sig: Take 1 tablet (2.5 mg total) by mouth 2 (two) times daily.    Dispense:  60 tablet    Refill:  2    Follow-up: Return in about 3 months (around 11/26/2017) for  Follow up of chronic medical conditions.   Arnoldo Morale MD

## 2017-09-06 ENCOUNTER — Encounter (HOSPITAL_COMMUNITY): Payer: Self-pay | Admitting: Emergency Medicine

## 2017-09-06 ENCOUNTER — Emergency Department (HOSPITAL_COMMUNITY): Payer: Medicare Other

## 2017-09-06 ENCOUNTER — Other Ambulatory Visit: Payer: Self-pay

## 2017-09-06 DIAGNOSIS — F17210 Nicotine dependence, cigarettes, uncomplicated: Secondary | ICD-10-CM | POA: Diagnosis not present

## 2017-09-06 DIAGNOSIS — R069 Unspecified abnormalities of breathing: Secondary | ICD-10-CM | POA: Diagnosis not present

## 2017-09-06 DIAGNOSIS — E039 Hypothyroidism, unspecified: Secondary | ICD-10-CM | POA: Insufficient documentation

## 2017-09-06 DIAGNOSIS — Z7901 Long term (current) use of anticoagulants: Secondary | ICD-10-CM | POA: Insufficient documentation

## 2017-09-06 DIAGNOSIS — R7989 Other specified abnormal findings of blood chemistry: Secondary | ICD-10-CM | POA: Diagnosis not present

## 2017-09-06 DIAGNOSIS — Z79899 Other long term (current) drug therapy: Secondary | ICD-10-CM | POA: Diagnosis not present

## 2017-09-06 DIAGNOSIS — R0602 Shortness of breath: Secondary | ICD-10-CM | POA: Diagnosis not present

## 2017-09-06 DIAGNOSIS — Z86711 Personal history of pulmonary embolism: Secondary | ICD-10-CM | POA: Diagnosis not present

## 2017-09-06 DIAGNOSIS — I10 Essential (primary) hypertension: Secondary | ICD-10-CM | POA: Diagnosis not present

## 2017-09-06 DIAGNOSIS — R079 Chest pain, unspecified: Secondary | ICD-10-CM | POA: Diagnosis not present

## 2017-09-06 DIAGNOSIS — J069 Acute upper respiratory infection, unspecified: Secondary | ICD-10-CM | POA: Insufficient documentation

## 2017-09-06 DIAGNOSIS — I700 Atherosclerosis of aorta: Secondary | ICD-10-CM | POA: Diagnosis not present

## 2017-09-06 DIAGNOSIS — B20 Human immunodeficiency virus [HIV] disease: Secondary | ICD-10-CM | POA: Diagnosis not present

## 2017-09-06 LAB — CBC
HCT: 32.4 % — ABNORMAL LOW (ref 39.0–52.0)
HEMOGLOBIN: 10 g/dL — AB (ref 13.0–17.0)
MCH: 24.3 pg — AB (ref 26.0–34.0)
MCHC: 30.9 g/dL (ref 30.0–36.0)
MCV: 78.8 fL (ref 78.0–100.0)
Platelets: 156 10*3/uL (ref 150–400)
RBC: 4.11 MIL/uL — AB (ref 4.22–5.81)
RDW: 22.2 % — ABNORMAL HIGH (ref 11.5–15.5)
WBC: 7.7 10*3/uL (ref 4.0–10.5)

## 2017-09-06 LAB — BASIC METABOLIC PANEL
Anion gap: 14 (ref 5–15)
BUN: 16 mg/dL (ref 6–20)
CHLORIDE: 107 mmol/L (ref 101–111)
CO2: 20 mmol/L — AB (ref 22–32)
CREATININE: 1.22 mg/dL (ref 0.61–1.24)
Calcium: 9.1 mg/dL (ref 8.9–10.3)
GFR calc non Af Amer: >60 mL/min (ref >60)
GLUCOSE: 79 mg/dL (ref 65–99)
Potassium: 3 mmol/L — ABNORMAL LOW (ref 3.5–5.1)
Sodium: 141 mmol/L (ref 135–145)

## 2017-09-06 LAB — I-STAT TROPONIN, ED: Troponin i, poc: 0 ng/mL (ref 0.00–0.08)

## 2017-09-06 NOTE — ED Triage Notes (Signed)
Pt c/o SOB for the past 2 days getting worse today, pt states is been to cold on his apartment and is making him feel worse.

## 2017-09-07 ENCOUNTER — Emergency Department (HOSPITAL_COMMUNITY)
Admission: EM | Admit: 2017-09-07 | Discharge: 2017-09-07 | Disposition: A | Discharge status: Home / Self Care | Payer: Medicare Other | Attending: Emergency Medicine | Admitting: Emergency Medicine

## 2017-09-07 ENCOUNTER — Emergency Department (HOSPITAL_COMMUNITY): Payer: Medicare Other

## 2017-09-07 DIAGNOSIS — J069 Acute upper respiratory infection, unspecified: Secondary | ICD-10-CM

## 2017-09-07 DIAGNOSIS — I700 Atherosclerosis of aorta: Secondary | ICD-10-CM | POA: Diagnosis not present

## 2017-09-07 DIAGNOSIS — R7989 Other specified abnormal findings of blood chemistry: Secondary | ICD-10-CM | POA: Diagnosis not present

## 2017-09-07 LAB — D-DIMER, QUANTITATIVE (NOT AT ARMC): D DIMER QUANT: 0.55 ug{FEU}/mL — AB (ref 0.00–0.50)

## 2017-09-07 MED ORDER — BENZONATATE 100 MG PO CAPS: 100.0000 mg | ORAL_CAPSULE | Freq: Three times a day (TID) | ORAL | 0 refills | Status: DC

## 2017-09-07 MED ORDER — PROMETHAZINE-DM 6.25-15 MG/5ML PO SYRP: 5.0000 mL | ORAL_SOLUTION | Freq: Four times a day (QID) | ORAL | 0 refills | Status: DC | PRN

## 2017-09-07 MED ORDER — ALBUTEROL SULFATE (2.5 MG/3ML) 0.083% IN NEBU
5.0000 mg | INHALATION_SOLUTION | Freq: Once | RESPIRATORY_TRACT | Status: AC
  Administered 2017-09-07: 5 mg via RESPIRATORY_TRACT
  Filled 2017-09-07: qty 6

## 2017-09-07 MED ORDER — SODIUM CHLORIDE 0.9 % IV BOLUS (SEPSIS)
500.0000 mL | Freq: Once | INTRAVENOUS | Status: AC
  Administered 2017-09-07: 500 mL via INTRAVENOUS

## 2017-09-07 MED ORDER — IOPAMIDOL (ISOVUE-370) INJECTION 76%
INTRAVENOUS | Status: AC
Start: 2017-09-07 — End: 2017-09-07
  Administered 2017-09-07: 100 mL
  Filled 2017-09-07: qty 100

## 2017-09-07 NOTE — ED Provider Notes (Signed)
[facility] EMERGENCY DEPARTMENT Provider Note   CSN: [id number] Arrival date & time: 09/06/17  2241     History   Chief Complaint Chief Complaint  Patient presents with  . Shortness of Breath    HPI AVIRAJ KENTNER is a 56 y.o. male.  HPI   57 year old male with history of HIV, PE on eliquis, schizophrenia, substance abuse presenting for evaluation of shortness of breath.  Patient report for the past week he has had cough productive with phlegm, occasional wheeze, feeling chills, having pleuritic chest pain along with shortness of breath.  Shortness of breath can be with exertion at rest.  States symptoms felt similar to prior pneumonia which she had in the past.  He also was diagnosed with having PE and is currently on Eliquis, he has been compliant with his medication.  He denies any associated fever, nausea, vomiting, diarrhea, abdominal pain or back pain.  Patient mentioned HIV is well controlled, is with a viral load that is undetectable 3 months ago and his last CD4 count was 300.  He denies any specific treatment tried for his condition.  He does report history of emphysema.  Past Medical History:  Diagnosis Date  . Depression   . HIV (human immunodeficiency virus infection) ([place])   . Hypertension   . Immune deficiency disorder ([place])   . PE (pulmonary thromboembolism) ([place]) 2018  . Personality disorder ([place])   . Schizophrenia [facility])     Patient Active Problem List   Diagnosis Date Noted  . Protein C deficiency ([place]) 07/05/2017  . Benign neoplasm of transverse colon   . Duodenal ulcer with hemorrhage   . Unintentional weight loss 06/14/2017  . Encounter for palliative care   . Coagulopathy ([place])   . Abnormal CT of the abdomen   . AKI (acute kidney injury) ([place]) 06/12/2017  . Chest pain 06/12/2017  . Emphysema lung ([place]) 11/20/2016  . DVT of lower extremity, bilateral ([place]) 11/04/2016  . Tobacco abuse 11/02/2016  . PE (pulmonary thromboembolism)  ([place]) 11/02/2016  . Cocaine dependence with cocaine-induced mood disorder ([place]) 03/29/2016  . HTN (hypertension) 08/25/2015  . Paranoid schizophrenia, chronic condition ([place]) 12/30/2014  . Alcohol use disorder, severe, dependence ([place]) 12/27/2014  . ERECTILE DYSFUNCTION 09/03/2007  . Human immunodeficiency virus (HIV) disease ([place]) 11/09/2006  . CA IN SITU, RECTUM 11/09/2006  . Hypothyroidism 11/09/2006  . DISORDER, BIPOLAR NOS 11/09/2006  . Depression 11/09/2006  . PYELONEPHRITIS 11/09/2006    Past Surgical History:  Procedure Laterality Date  . COLONOSCOPY WITH PROPOFOL N/A 06/15/2017   Procedure: COLONOSCOPY WITH PROPOFOL;  Surgeon: Irene Shipper, MD;  [facility];  Service: Endoscopy;  Laterality: N/A;  . ESOPHAGOGASTRODUODENOSCOPY (EGD) WITH PROPOFOL N/A 06/15/2017   Procedure: ESOPHAGOGASTRODUODENOSCOPY (EGD) WITH PROPOFOL;  Surgeon: Irene Shipper, MD;  [facility];  Service: Endoscopy;  Laterality: N/A;  . IR GENERIC HISTORICAL  11/04/2016   IR IVC FILTER PLMT / S&I Burke Keels GUID/MOD SED 11/04/2016 Aletta Edouard, MD [facility]-INTER[facility] RAD       Home Medications    Prior to Admission medications   Medication Sig Start Date End Date Taking? Authorizing Provider  acetaminophen (TYLENOL) 500 MG tablet Take 1 tablet (500 mg total) by mouth every 6 (six) hours as needed for moderate pain. 11/07/16   Eugenie Filler, MD  apixaban (ELIQUIS) 2.5 MG TABS tablet Take 1 tablet (2.5 mg total) by mouth 2 (two) times daily. 08/28/17   Arnoldo Morale, MD  benztropine (COGENTIN)  1 MG tablet 1 tab in the morning 2 tabs at night 11/10/16   McClung, Angela M, PA-C  elvitegravir-cobicistat-emtricitabine-tenofovir (GENVOYA) 150-150-200-10 MG TABS tablet TAKE 1 TABLET BY MOUTH DAILY WITH BREAKFAST. Christena Flake [phone] to see Dr Johnnye Sima ASAP* 07/17/16   Janece Canterbury, MD  fluconazole (DIFLUCAN) 100 MG tablet Take 1 tablet (100 mg total) by mouth once a week. Patient not taking: Reported on  06/29/2017 05/31/17 12/27/17  Caren Griffins, MD  gabapentin (NEURONTIN) 600 MG tablet Take 600 mg by mouth at bedtime.    [provider]  GENVOYA 150-150-200-10 MG TABS tablet TAKE ONE TABLET BY MOUTH DAILY WITH BREAKFAST 07/20/17   Campbell Riches, MD  haloperidol (HALDOL) 10 MG tablet Take 0.5 tablets (5 mg total) by mouth 2 (two) times daily. 06/18/17   Domenic Polite, MD  lisinopril (PRINIVIL,ZESTRIL) 10 MG tablet Take 10 mg by mouth daily. 06/23/17   [provider]  magnesium oxide (MAG-OX) 400 (241.3 Mg) MG tablet Take 1 tablet (400 mg total) by mouth 2 (two) times daily. 11/07/16   Eugenie Filler, MD  metoprolol tartrate (LOPRESSOR) 25 MG tablet Take 0.5 tablets (12.5 mg total) by mouth 2 (two) times daily. 06/29/17   Arnoldo Morale, MD  mirtazapine (REMERON) 30 MG tablet Take 1 tablet (30 mg total) by mouth at bedtime. 06/20/17   [place] Callas, NP  Multiple Vitamin (MULTIVITAMIN WITH MINERALS) TABS tablet Take 1 tablet by mouth daily. 11/08/16   Eugenie Filler, MD  pantoprazole (PROTONIX) 40 MG tablet Take 1 tablet (40 mg total) by mouth 2 (two) times daily before a meal. 06/20/17   [place] Callas, NP  polyethylene glycol powder (GLYCOLAX/MIRALAX) powder mix with a liquid AND drink daily 06/25/17   [provider]  sulfamethoxazole-trimethoprim (BACTRIM DS) 800-160 MG tablet Take 1 tablet by mouth daily. 06/20/17   [place] Callas, NP  tiotropium (SPIRIVA) 18 MCG inhalation capsule Place 1 capsule (18 mcg total) into inhaler and inhale daily. Patient not taking: Reported on 07/18/2017 11/08/16   Eugenie Filler, MD  traZODone (DESYREL) 50 MG tablet Take 1 tablet (50 mg total) by mouth at bedtime. 07/16/17   Arnoldo Morale, MD  Valbenazine Tosylate (INGREZZA) 80 MG CAPS Take 80 mg by mouth 2 (two) times daily.     [provider]    Family History Family History  Problem Relation Age of Onset  . CAD Mother   . Kidney disease Sister   .  Lupus Brother   . Cancer Maternal Grandmother   . Stroke Paternal Grandmother     Social History Social History   Tobacco Use  . Smoking status: Current Every Day Smoker    Packs/day: 1.00    Years: 33.00    Pack years: 33.00    Types: Cigarettes  . Smokeless tobacco: Never Used  . Tobacco comment: cutting back  Substance Use Topics  . Alcohol use: Yes    Alcohol/week: 1.8 oz    Types: 3 Shots of liquor per week    Comment: per patient drinks one pint a month 06/20/17  . Drug use: No    Comment: last use summer 2017     Allergies   Amoxicillin and Latex   Review of Systems Review of Systems  All other systems reviewed and are negative.    Physical Exam Updated Vital Signs BP 117/76   Pulse 66   Temp 98.9 F (37.2 C) (Oral)   Resp 18   SpO2 97%  Physical Exam  Constitutional: He appears well-developed and well-nourished. No distress.  HENT:  Head: Atraumatic.  Mouth/Throat: Oropharynx is clear and moist. No oropharyngeal exudate.  Eyes: Conjunctivae and EOM are normal. Pupils are equal, round, and reactive to light.  Neck: Neck supple.  No nuchal rigidity  Cardiovascular: Normal rate, regular rhythm, normal heart sounds and intact distal pulses.  Pulmonary/Chest: He has no decreased breath sounds. He has no wheezes. He has rhonchi. He has no rales.  Abdominal: Soft. There is no tenderness.  Musculoskeletal:       Right lower leg: He exhibits no edema.       Left lower leg: He exhibits no edema.  Lymphadenopathy:    He has no cervical adenopathy.  Neurological: He is alert.  Skin: Capillary refill takes less than 2 seconds. No rash noted.  Psychiatric: He has a normal mood and affect.  Nursing note and vitals reviewed.    ED Treatments / Results  Labs (all labs ordered are listed, but only abnormal results are displayed) Labs Reviewed  BASIC METABOLIC PANEL - Abnormal; Notable for the following components:      Result Value   Potassium 3.0 (*)      CO2 20 (*)    All other components within normal limits  CBC - Abnormal; Notable for the following components:   RBC 4.11 (*)    Hemoglobin 10.0 (*)    HCT 32.4 (*)    MCH 24.3 (*)    RDW 22.2 (*)    All other components within normal limits  D-DIMER, QUANTITATIVE (NOT AT [facility])  I-STAT TROPONIN, ED    EKG  EKG Interpretation  Date/Time:  Thursday September 06 2017 22:45:20 EST Ventricular Rate:  69 PR Interval:  146 QRS Duration: 100 QT Interval:  424 QTC Calculation: 454 R Axis:   56 Text Interpretation:  Normal sinus rhythm RSR' or QR pattern in V1 suggests right ventricular conduction delay Abnormal ECG Confirmed by Veryl Speak [phone]) on 09/07/2017 6:02:55 AM       Radiology Dg Chest 2 View  Result Date: 09/07/2017 CLINICAL DATA:  Lightheadedness, dyspnea and chest pain for 3 days. EXAM: CHEST  2 VIEW COMPARISON:  06/12/2017 FINDINGS: Unchanged mild cardiomegaly. The lungs are clear. The pulmonary vasculature is normal. There is no pleural effusion. Hilar and mediastinal contours are unremarkable and unchanged. IMPRESSION: Stable cardiomegaly.  No consolidation.  Normal vasculature Electronically Signed   By: Andreas Newport M.D.   On: 09/07/2017 00:10    Procedures Procedures (including critical care time)  Medications Ordered in ED Medications  albuterol (PROVENTIL) (2.5 MG/3ML) 0.083% nebulizer solution 5 mg (5 mg Nebulization Given 09/07/17 0537)     Initial Impression / Assessment and Plan / ED Course  I have reviewed the triage vital signs and the nursing notes.  Pertinent labs & imaging results that were available during my care of the patient were reviewed by me and considered in my medical decision making (see chart for details).     BP 117/76   Pulse 66   Temp 98.9 F (37.2 C) (Oral)   Resp 18   SpO2 97%    Final Clinical Impressions(s) / ED Diagnoses   Final diagnoses:  URI, acute    ED Discharge Orders        Ordered     benzonatate (TESSALON) 100 MG capsule  Every 8 hours     09/07/17 0652    promethazine-dextromethorphan (PROMETHAZINE-DM) 6.25-15 MG/5ML syrup  4 times daily  PRN     09/07/17 0652      5:40 AM Patient came in complaining of pleuritic chest pain, short of breath.  Remote history of PE currently on Eliquis. He does have other URI sxs.  His CXR without acute finding.  K+ 3.0, supplementation given.  Pt does have URI sxs.  However, due to hx of PE and does report SOB, will check d-dimer to assess possibility of PE.  If negative d-dimer, pt can be discharge home with sxs treatment for URI.  Pt is aware of plan.  Care sign out to oncoming provider.   6:56 AM D-dimer is mildly elevated, Chest CTA ordered.     Domenic Moras, PA-C 09/07/17 9021    Veryl Speak, MD 09/11/17 [phone]

## 2017-09-07 NOTE — Discharge Instructions (Addendum)
You appear to have an upper respiratory infection (URI). An upper respiratory tract infection, or cold, is a viral infection of the air passages leading to the lungs. It is contagious and can be spread to others, especially during the first 3 or 4 days. It cannot be cured by antibiotics or other medicines. °RETURN IMMEDIATELY IF you develop shortness of breath, confusion or altered mental status, a new rash, become dizzy, faint, or poorly responsive, or are unable to be cared for at home. ° °

## 2017-09-07 NOTE — ED Provider Notes (Signed)
Patient taken in sigbn out from [place] Patient is HIV+. Here for uri sxs. + cp/sob. Work up negative at this time. Hx of PE- on Eliquis.+D-dimer result. Awaiting CTA. Expect d/c.    CT negative . Will discharge with tx for URI Discussed return precautions.   Margarita Mail, PA-C 09/08/17 1612    Veryl Speak, MD 09/08/17 2255

## 2017-09-12 ENCOUNTER — Other Ambulatory Visit: Payer: Medicare Other

## 2017-09-12 DIAGNOSIS — B20 Human immunodeficiency virus [HIV] disease: Secondary | ICD-10-CM

## 2017-09-12 LAB — COMPREHENSIVE METABOLIC PANEL
AG RATIO: 1.2 (calc) (ref 1.0–2.5)
ALKALINE PHOSPHATASE (APISO): 51 U/L (ref 40–115)
ALT: 9 U/L (ref 9–46)
AST: 19 U/L (ref 10–35)
Albumin: 3.7 g/dL (ref 3.6–5.1)
BILIRUBIN TOTAL: 0.4 mg/dL (ref 0.2–1.2)
BUN: 16 mg/dL (ref 7–25)
CALCIUM: 9.3 mg/dL (ref 8.6–10.3)
CO2: 23 mmol/L (ref 20–32)
Chloride: 114 mmol/L — ABNORMAL HIGH (ref 98–110)
Creat: 1.09 mg/dL (ref 0.70–1.33)
Globulin: 3.1 g/dL (calc) (ref 1.9–3.7)
Glucose, Bld: 92 mg/dL (ref 65–99)
POTASSIUM: 3.9 mmol/L (ref 3.5–5.3)
SODIUM: 144 mmol/L (ref 135–146)
Total Protein: 6.8 g/dL (ref 6.1–8.1)

## 2017-09-12 LAB — CBC
HCT: 34.1 % — ABNORMAL LOW (ref 38.5–50.0)
HEMOGLOBIN: 10.2 g/dL — AB (ref 13.2–17.1)
MCH: 24.2 pg — AB (ref 27.0–33.0)
MCHC: 29.9 g/dL — AB (ref 32.0–36.0)
MCV: 80.8 fL (ref 80.0–100.0)
MPV: 10.4 fL (ref 7.5–12.5)
PLATELETS: 161 10*3/uL (ref 140–400)
RBC: 4.22 10*6/uL (ref 4.20–5.80)
RDW: 21.2 % — ABNORMAL HIGH (ref 11.0–15.0)
WBC: 4.1 10*3/uL (ref 3.8–10.8)

## 2017-09-13 LAB — T-HELPER CELL (CD4) - (RCID CLINIC ONLY)
CD4 % Helper T Cell: 21 % — ABNORMAL LOW (ref 33–55)
CD4 T Cell Abs: 240 /uL — ABNORMAL LOW (ref 400–2700)

## 2017-09-14 LAB — HIV-1 RNA QUANT-NO REFLEX-BLD
HIV 1 RNA QUANT: 35 {copies}/mL — AB
HIV-1 RNA QUANT, LOG: 1.54 {Log_copies}/mL — AB

## 2017-09-18 ENCOUNTER — Other Ambulatory Visit: Payer: Self-pay | Admitting: Family Medicine

## 2017-09-18 DIAGNOSIS — I10 Essential (primary) hypertension: Secondary | ICD-10-CM

## 2017-09-19 ENCOUNTER — Telehealth: Payer: Self-pay | Admitting: *Deleted

## 2017-09-19 ENCOUNTER — Other Ambulatory Visit: Payer: Self-pay | Admitting: Family Medicine

## 2017-09-19 ENCOUNTER — Other Ambulatory Visit: Payer: Self-pay | Admitting: *Deleted

## 2017-09-19 DIAGNOSIS — I10 Essential (primary) hypertension: Secondary | ICD-10-CM

## 2017-09-19 DIAGNOSIS — B20 Human immunodeficiency virus [HIV] disease: Secondary | ICD-10-CM

## 2017-09-19 MED ORDER — SULFAMETHOXAZOLE-TRIMETHOPRIM 800-160 MG PO TABS: 1.0000 | ORAL_TABLET | Freq: Every day | ORAL | 1 refills | Status: DC

## 2017-09-20 ENCOUNTER — Encounter: Payer: Self-pay | Admitting: Infectious Diseases

## 2017-09-20 ENCOUNTER — Ambulatory Visit (INDEPENDENT_AMBULATORY_CARE_PROVIDER_SITE_OTHER): Payer: Medicare Other | Admitting: Infectious Diseases

## 2017-09-20 VITALS — BP 157/84 | HR 72 | Temp 98.4°F | Ht 69.5 in | Wt 144.0 lb

## 2017-09-20 DIAGNOSIS — B20 Human immunodeficiency virus [HIV] disease: Secondary | ICD-10-CM | POA: Diagnosis present

## 2017-09-20 DIAGNOSIS — F1020 Alcohol dependence, uncomplicated: Secondary | ICD-10-CM

## 2017-09-20 DIAGNOSIS — F329 Major depressive disorder, single episode, unspecified: Secondary | ICD-10-CM | POA: Diagnosis not present

## 2017-09-20 DIAGNOSIS — Z720 Tobacco use: Secondary | ICD-10-CM | POA: Diagnosis not present

## 2017-09-20 DIAGNOSIS — F32A Depression, unspecified: Secondary | ICD-10-CM

## 2017-09-20 NOTE — Patient Instructions (Addendum)
Can stop Bactrim for.   Please continue the Genvoya every day.   Come back to see our team in 6 weeks. Will recheck your labs at that visit.   Talk with your psychiatrist about medicines to stop smoking.

## 2017-09-20 NOTE — Assessment & Plan Note (Addendum)
Controlled with VL 35 copies recently. He has had adequate CD4 > 200 for over 3 months now so will have him stop his Bactrim. [name] Herring shared concern today about safety living at home and having ability to physically and cognitively arrange for transportation for his multiple doctor appointments. He has had falls recently, physical difficulty walking, memory impairment and I worry greatly about his ability to use public transportation safely. Will work on arranging for [employer] assistance.   He is doing well with his HIV and will continue to follow closely to ensure he has what he needs. Will have him back in 6 weeks. Can do labs at that time to help with transportation.     Provided with food today. I worry he is nearing the point where he will require assisted living.

## 2017-09-20 NOTE — Assessment & Plan Note (Signed)
Stable on current regimen with adequate sleep. Continue to work with his psychiatrists.

## 2017-09-20 NOTE — Assessment & Plan Note (Signed)
Reports he would like to discuss Chantix to stop smoking. I worry about use of this medication with his mental health history. I have congratulated him on desire to quit but would like for him to discuss with his psychiatrist about safe medication options. Unable to afford patches/gum.

## 2017-09-20 NOTE — Assessment & Plan Note (Signed)
Has been in and out of treatment in the past. I am not certain he is honest about current use. Offered to meet with Judeen Hammans for counseling and SA treatment today.

## 2017-09-20 NOTE — Progress Notes (Signed)
[name] Herring DOB: 09/12/1961 MRN: [id number] PCP: Arnoldo Morale, MD   Reason for Visit: HIV Routine Follow Up     Brief Narrative: [name] Herring is a 56 y.o. AA male with a history of HIV infection. Originally diagnosed 08/18/1998 and previously followed at [place] [facility]. HIV Risk: MSM. History of OIs: uncertain. History of ETOH/cocaine abuse, previously homeless. Previous ART includes Kaletra + Combivir. Now on Genvoya.   Patient Active Problem List   Diagnosis Date Noted  . Protein C deficiency ([place]) 07/05/2017  . Benign neoplasm of transverse colon   . Duodenal ulcer with hemorrhage   . Unintentional weight loss 06/14/2017  . Encounter for palliative care   . Coagulopathy ([place])   . Abnormal CT of the abdomen   . AKI (acute kidney injury) ([place]) 06/12/2017  . Chest pain 06/12/2017  . Emphysema lung ([place]) 11/20/2016  . DVT of lower extremity, bilateral ([place]) 11/04/2016  . Tobacco abuse 11/02/2016  . PE (pulmonary thromboembolism) ([place]) 11/02/2016  . Cocaine dependence with cocaine-induced mood disorder ([place]) 03/29/2016  . HTN (hypertension) 08/25/2015  . Paranoid schizophrenia, chronic condition ([place]) 12/30/2014  . Alcohol use disorder, severe, dependence ([place]) 12/27/2014  . ERECTILE DYSFUNCTION 09/03/2007  . Human immunodeficiency virus (HIV) disease ([place]) 11/09/2006  . CA IN SITU, RECTUM 11/09/2006  . Hypothyroidism 11/09/2006  . DISORDER, BIPOLAR NOS 11/09/2006  . Depression 11/09/2006  . PYELONEPHRITIS 11/09/2006   HPI:  HIV =  Currently on a regimen of Genvoya and reports good adherence with it. He is brought to our clinic today by our bridge counselor [name] Herring. Currently living at home by himself but does have a nephew that checks in on him. Has been happy regaining weight but does not have good access to food. Reports no complaints today suggestive of associated opportunistic infection or advancing HIV disease such as fevers, night sweats, weight loss, anorexia, cough,  SOB, nausea, vomiting, diarrhea, headache, sensory changes, lymphadenopathy or oral thrush. Tells me he has trouble getting around and had fall in the shower recently where he 'blacked out cold and hit his head.'   Depression = Doing well on current medication regimen.   Alcohol Use Disorder = No alcohol use in the last 3 weeks per his report. Previously in and out of treatment programs.   Depression screen [facility] 2/9 09/20/2017  Decreased Interest 0  Down, Depressed, Hopeless 0  PHQ - 2 Score 0  Altered sleeping -  Tired, decreased energy -  Change in appetite -  Feeling bad or failure about yourself  -  Trouble concentrating -  Moving slowly or fidgety/restless -  Suicidal thoughts -  PHQ-9 Score -  Some recent data might be hidden   Review of Systems: Review of Systems  Constitutional: Negative for chills, fever, malaise/fatigue and weight loss.  HENT: Negative for sore throat.        No dental problems  Respiratory: Negative for cough and sputum production.   Cardiovascular: Negative for chest pain and leg swelling.  Gastrointestinal: Negative for abdominal pain, blood in stool, diarrhea and vomiting.  Genitourinary: Negative for dysuria and flank pain.  Musculoskeletal: Negative for joint pain, myalgias and neck pain.  Skin: Negative for rash.  Neurological: Positive for tremors and weakness. Negative for dizziness, tingling and headaches.  Psychiatric/Behavioral: Positive for depression and substance abuse (ETOH ). The patient is not nervous/anxious and does not have insomnia.    Past Medical History:  Diagnosis Date  . Depression   . HIV (  human immunodeficiency virus infection) ([place])   . Hypertension   . Immune deficiency disorder ([place])   . PE (pulmonary thromboembolism) ([place]) 2018  . Personality disorder ([place])   . Schizophrenia [facility])    Outpatient Medications Prior to Visit  Medication Sig Dispense Refill  . acetaminophen (TYLENOL) 500 MG tablet Take 1 tablet (500  mg total) by mouth every 6 (six) hours as needed for moderate pain. 30 tablet 0  . apixaban (ELIQUIS) 2.5 MG TABS tablet Take 1 tablet (2.5 mg total) by mouth 2 (two) times daily. 60 tablet 2  . benzonatate (TESSALON) 100 MG capsule Take 1 capsule (100 mg total) by mouth every 8 (eight) hours. 21 capsule 0  . benztropine (COGENTIN) 1 MG tablet 1 tab in the morning 2 tabs at night (Patient taking differently: Take 1-2 mg by mouth See admin instructions. Take 1 tablet in the morning and take 2 tablets at night) 14 tablet 0  . fluconazole (DIFLUCAN) 100 MG tablet Take 1 tablet (100 mg total) by mouth once a week. 30 tablet 0  . gabapentin (NEURONTIN) 600 MG tablet Take 600 mg by mouth at bedtime.    . GENVOYA 150-150-200-10 MG TABS tablet TAKE ONE TABLET BY MOUTH DAILY WITH BREAKFAST 30 tablet 6  . haloperidol (HALDOL) 10 MG tablet Take 0.5 tablets (5 mg total) by mouth 2 (two) times daily.    Marland Kitchen lisinopril (PRINIVIL,ZESTRIL) 10 MG tablet Take 10 mg by mouth daily.  1  . lisinopril (PRINIVIL,ZESTRIL) 10 MG tablet take one tablet by MOUTH daily 90 tablet 1  . magnesium oxide (MAG-OX) 400 (241.3 Mg) MG tablet Take 1 tablet (400 mg total) by mouth 2 (two) times daily. 60 tablet 0  . metoprolol tartrate (LOPRESSOR) 25 MG tablet Take 0.5 tablets (12.5 mg total) by mouth 2 (two) times daily. 30 tablet 6  . mirtazapine (REMERON) 30 MG tablet Take 1 tablet (30 mg total) by mouth at bedtime. 30 tablet 3  . Multiple Vitamin (MULTIVITAMIN WITH MINERALS) TABS tablet Take 1 tablet by mouth daily.    . pantoprazole (PROTONIX) 40 MG tablet Take 1 tablet (40 mg total) by mouth 2 (two) times daily before a meal. 60 tablet 1  . polyethylene glycol (MIRALAX / GLYCOLAX) packet Take 17 g by mouth daily.    . promethazine-dextromethorphan (PROMETHAZINE-DM) 6.25-15 MG/5ML syrup Take 5 mLs by mouth 4 (four) times daily as needed for cough. 150 mL 0  . sulfamethoxazole-trimethoprim (BACTRIM DS) 800-160 MG tablet Take 1 tablet  by mouth daily. 30 tablet 1  . tiotropium (SPIRIVA) 18 MCG inhalation capsule Place 1 capsule (18 mcg total) into inhaler and inhale daily. 30 capsule 6  . traZODone (DESYREL) 50 MG tablet Take 1 tablet (50 mg total) by mouth at bedtime. 30 tablet 3  . elvitegravir-cobicistat-emtricitabine-tenofovir (GENVOYA) 150-150-200-10 MG TABS tablet TAKE 1 TABLET BY MOUTH DAILY WITH BREAKFAST. *CALL [phone] to see Dr Johnnye Sima ASAP* 30 tablet 1  . Valbenazine Tosylate (INGREZZA) 80 MG CAPS Take 80 mg by mouth 2 (two) times daily.      No facility-administered medications prior to visit.    Allergies  Allergen Reactions  . Amoxicillin Shortness Of Breath and Rash    Has patient had a PCN reaction causing immediate rash, facial/tongue/throat swelling, [name] Has patient had a PCN reaction causing severe rash involving mucus membranes or skin necrosis: no Has patient had a PCN reaction that required hospitalization: yes drs office visit Has patient had a  PCN reaction occurring within the last 10 years: no If all of the above answers are "NO", then may proceed with Cephalosporin use.   . Latex Shortness Of Breath   Social History   Tobacco Use  . Smoking status: Current Every Day Smoker    Packs/day: 1.00    Years: 33.00    Pack years: 33.00    Types: Cigarettes  . Smokeless tobacco: Never Used  . Tobacco comment: cutting back  Substance Use Topics  . Alcohol use: Yes    Alcohol/week: 1.8 oz    Types: 3 Shots of liquor per week    Comment: per patient drinks one pint a month 06/20/17  . Drug use: No    Comment: last use summer 2017   Objective:  Vitals:   09/20/17 1349  BP: (!) 157/84  Pulse: 72  Temp: 98.4 F (36.9 C)  Weight: 144 lb (65.3 kg)  Height: 5' 9.5" (1.765 m)   Body mass index is 20.96 kg/m.  Physical Exam  Constitutional: He is oriented to person, place, and time.  Thin AA male. Chronically ill-appearing. In good spirits.     HENT:  Mouth/Throat: Oropharynx is clear and moist.  Eyes: Pupils are equal, round, and reactive to light. No scleral icterus.  Cardiovascular: Normal rate, regular rhythm and normal heart sounds.  Pulmonary/Chest: Effort normal and breath sounds normal. No respiratory distress.  Occasional dry cough noted throughout exam.   Abdominal: Soft. Bowel sounds are normal. He exhibits no distension. There is no tenderness.  Lymphadenopathy:    He has no cervical adenopathy.  Neurological: He is alert and oriented to person, place, and time. He has normal strength.  Shuffled gait, tremors and teeth grinding noted   Skin: Skin is warm and dry.  Psychiatric: Mood, affect and judgment normal.   Lab Results Lab Results  Component Value Date   WBC 4.1 09/12/2017   HGB 10.2 (L) 09/12/2017   HCT 34.1 (L) 09/12/2017   MCV 80.8 09/12/2017   PLT 161 09/12/2017    Lab Results  Component Value Date   CREATININE 1.09 09/12/2017   BUN 16 09/12/2017   NA 144 09/12/2017   K 3.9 09/12/2017   CL 114 (H) 09/12/2017   CO2 23 09/12/2017    Lab Results  Component Value Date   ALT 9 09/12/2017   AST 19 09/12/2017   ALKPHOS 58 06/29/2017   BILITOT 0.4 09/12/2017    Lab Results  Component Value Date   CHOL 153 08/05/2015   HDL 93 08/05/2015   LDLCALC 30 08/05/2015   TRIG 151 (H) 08/05/2015   CHOLHDL 1.6 08/05/2015   HIV 1 RNA Quant (copies/mL)  Date Value  09/12/2017 35 (H)  07/18/2017 21 (H)  06/20/2017 63 (H)   CD4 T Cell Abs (/uL)  Date Value  09/12/2017 240 (L)  07/18/2017 280 (L)  06/20/2017 300 (L)   No results found for: HAV Lab Results  Component Value Date   HEPBSAG Negative 09/11/2007   HEPBSAB Undetermined 09/11/2007   Lab Results  Component Value Date   HCVAB Negative 09/11/2007   Lab Results  Component Value Date   CHLAMYDIAWP Negative 01/13/2015   N Negative 01/13/2015   No results found for: GCPROBEAPT No results found for: QUANTGOLD No results found for:  RPR    Problem List Items Addressed This Visit      Other   Alcohol use disorder, severe, dependence ([place])    Has been in and out of  treatment in the past. I am not certain he is honest about current use. Offered to meet with Judeen Hammans for counseling and SA treatment today.       Depression    Stable on current regimen with adequate sleep. Continue to work with his psychiatrists.       Human immunodeficiency virus (HIV) disease ([place]) - Primary (Chronic)    Controlled with VL 35 copies recently. He has had adequate CD4 > 200 for over 3 months now so will have him stop his Bactrim. [name] Herring shared concern today about safety living at home and having ability to physically and cognitively arrange for transportation for his multiple doctor appointments. He has had falls recently, physical difficulty walking, memory impairment and I worry greatly about his ability to use public transportation safely. Will work on arranging for [employer] assistance.   He is doing well with his HIV and will continue to follow closely to ensure he has what he needs. Will have him back in 6 weeks. Can do labs at that time to help with transportation.     Provided with food today. I worry he is nearing the point where he will require assisted living.       Tobacco abuse    Reports he would like to discuss Chantix to stop smoking. I worry about use of this medication with his mental health history. I have congratulated him on desire to quit but would like for him to discuss with his psychiatrist about safe medication options. Unable to afford patches/gum.         Janene Madeira, MSN, NP-C [facility] Pager: [phone]  09/20/17 1:51 PM

## 2017-09-21 ENCOUNTER — Telehealth: Payer: Self-pay | Admitting: Family Medicine

## 2017-09-21 ENCOUNTER — Other Ambulatory Visit: Payer: Self-pay | Admitting: *Deleted

## 2017-09-21 NOTE — Telephone Encounter (Signed)
Explained to [name] Herring that patient is not on Norvasc nor HCTZ presently according to our medication list for BP. Encourage [name] Herring to call patient pharmacy if further assistance was needed when he last picked up medications she had in question: HCTZ and Norvasc.

## 2017-09-21 NOTE — Telephone Encounter (Signed)
Edwin Cap from [facility] called the office asking to speak with nurse or patient's provider regarding patient's medication. Edwin Cap stated that she needs [place] to take over patient's medication (Norvax). Please return the call.  Thank you.

## 2017-09-21 NOTE — Telephone Encounter (Signed)
Called pharmacy to verify if patient was getting amlodipine or HCTZ. Writer was informed that patient last received Amlodipine in June and does not see record of HCTZ.

## 2017-09-24 ENCOUNTER — Other Ambulatory Visit: Payer: Self-pay | Admitting: Family Medicine

## 2017-09-24 DIAGNOSIS — K254 Chronic or unspecified gastric ulcer with hemorrhage: Secondary | ICD-10-CM

## 2017-09-28 NOTE — Telephone Encounter (Signed)
PATIENT ON HOLD  Plan of Care orders have expired effective 09/19/17 but GOALS have not been completely meet at this time. I would like to connect with the patient and received further orders from MD. If I am unable to get in contact with her within the next 30 days I will have to discharge at that time. RN WILL NOT RESUME CARE UNTIL NEW MD ORDERS OBTAINED AND PATIENT ABLE/WILLING TO RE-ENGAGE IN CARE

## 2017-09-28 NOTE — Progress Notes (Signed)
Home visit made with Thayer Jew today. He appear and states that he is feeling well and has no trouble with taking his medications. Today we sat and reviewed his current medications regimen. Our education included the time, frequency and reason for each medication. Mr Stettler has an upcoming appt with the Dr and I assume at that time the will let him know if he can resume his blood thinner. RN has saved my number in [facility] phone to ensure he can get in contact with me.

## 2017-10-04 ENCOUNTER — Telehealth: Payer: Self-pay | Admitting: *Deleted

## 2017-10-04 NOTE — Telephone Encounter (Signed)
RN contacted the patient as an attempt to stay connected/engaged.Unable to leave a message/VM has not been set up. Will attempt another call

## 2017-10-17 ENCOUNTER — Other Ambulatory Visit: Payer: Self-pay | Admitting: *Deleted

## 2017-10-18 ENCOUNTER — Ambulatory Visit: Payer: Self-pay | Admitting: *Deleted

## 2017-10-18 VITALS — BP 116/76 | HR 116

## 2017-10-18 DIAGNOSIS — F1020 Alcohol dependence, uncomplicated: Secondary | ICD-10-CM

## 2017-10-18 DIAGNOSIS — F1424 Cocaine dependence with cocaine-induced mood disorder: Secondary | ICD-10-CM

## 2017-10-18 DIAGNOSIS — B20 Human immunodeficiency virus [HIV] disease: Secondary | ICD-10-CM

## 2017-10-24 ENCOUNTER — Other Ambulatory Visit: Payer: Medicare Other

## 2017-10-24 ENCOUNTER — Other Ambulatory Visit: Payer: Self-pay | Admitting: Behavioral Health

## 2017-10-24 DIAGNOSIS — B20 Human immunodeficiency virus [HIV] disease: Secondary | ICD-10-CM

## 2017-10-25 LAB — T-HELPER CELL (CD4) - (RCID CLINIC ONLY)
CD4 T CELL HELPER: 20 % — AB (ref 33–55)
CD4 T Cell Abs: 290 /uL — ABNORMAL LOW (ref 400–2700)

## 2017-10-26 LAB — HIV-1 RNA QUANT-NO REFLEX-BLD
HIV 1 RNA QUANT: 26 {copies}/mL — AB
HIV-1 RNA Quant, Log: 1.41 Log copies/mL — ABNORMAL HIGH

## 2017-10-28 ENCOUNTER — Emergency Department (HOSPITAL_COMMUNITY)
Admission: EM | Admit: 2017-10-28 | Discharge: 2017-10-29 | Disposition: A | Discharge status: Home / Self Care | Payer: Medicare Other | Attending: Emergency Medicine | Admitting: Emergency Medicine

## 2017-10-28 DIAGNOSIS — Y929 Unspecified place or not applicable: Secondary | ICD-10-CM | POA: Insufficient documentation

## 2017-10-28 DIAGNOSIS — S0990XA Unspecified injury of head, initial encounter: Secondary | ICD-10-CM | POA: Diagnosis present

## 2017-10-28 DIAGNOSIS — R51 Headache: Secondary | ICD-10-CM | POA: Diagnosis not present

## 2017-10-28 DIAGNOSIS — Y998 Other external cause status: Secondary | ICD-10-CM | POA: Insufficient documentation

## 2017-10-28 DIAGNOSIS — I10 Essential (primary) hypertension: Secondary | ICD-10-CM | POA: Diagnosis not present

## 2017-10-28 DIAGNOSIS — W109XXA Fall (on) (from) unspecified stairs and steps, initial encounter: Secondary | ICD-10-CM | POA: Insufficient documentation

## 2017-10-28 DIAGNOSIS — Z86718 Personal history of other venous thrombosis and embolism: Secondary | ICD-10-CM | POA: Insufficient documentation

## 2017-10-28 DIAGNOSIS — B20 Human immunodeficiency virus [HIV] disease: Secondary | ICD-10-CM | POA: Diagnosis not present

## 2017-10-28 DIAGNOSIS — Z79899 Other long term (current) drug therapy: Secondary | ICD-10-CM | POA: Diagnosis not present

## 2017-10-28 DIAGNOSIS — R079 Chest pain, unspecified: Secondary | ICD-10-CM | POA: Diagnosis not present

## 2017-10-28 DIAGNOSIS — W19XXXA Unspecified fall, initial encounter: Secondary | ICD-10-CM

## 2017-10-28 DIAGNOSIS — F17210 Nicotine dependence, cigarettes, uncomplicated: Secondary | ICD-10-CM | POA: Diagnosis not present

## 2017-10-28 DIAGNOSIS — S161XXA Strain of muscle, fascia and tendon at neck level, initial encounter: Secondary | ICD-10-CM | POA: Diagnosis not present

## 2017-10-28 DIAGNOSIS — F1410 Cocaine abuse, uncomplicated: Secondary | ICD-10-CM | POA: Diagnosis not present

## 2017-10-28 DIAGNOSIS — M542 Cervicalgia: Secondary | ICD-10-CM | POA: Diagnosis not present

## 2017-10-28 DIAGNOSIS — R52 Pain, unspecified: Secondary | ICD-10-CM | POA: Diagnosis not present

## 2017-10-28 DIAGNOSIS — Z91040 Latex allergy status: Secondary | ICD-10-CM | POA: Insufficient documentation

## 2017-10-28 DIAGNOSIS — Z7901 Long term (current) use of anticoagulants: Secondary | ICD-10-CM | POA: Diagnosis not present

## 2017-10-28 DIAGNOSIS — F200 Paranoid schizophrenia: Secondary | ICD-10-CM | POA: Diagnosis not present

## 2017-10-28 DIAGNOSIS — S199XXA Unspecified injury of neck, initial encounter: Secondary | ICD-10-CM | POA: Diagnosis not present

## 2017-10-28 DIAGNOSIS — Y939 Activity, unspecified: Secondary | ICD-10-CM | POA: Insufficient documentation

## 2017-10-28 NOTE — ED Provider Notes (Signed)
[facility] DEPT Provider Note   CSN: [id number] Arrival date & time: 10/28/17  2254     History   Chief Complaint Chief Complaint  Patient presents with  . Fall    fell down the stairs 6 hrs ago. ( no visiable injuries)     HPI KORDAE BUONOCORE is a 57 y.o. male.  Patient is a 57 year old male with past medical history of HIV disease, polysubstance abuse, hypertension, and pulmonary embolism for which he is on Eliquis.  He was brought by EMS after a fall.  He was apparently smoking crack cocaine this evening, then fell down a flight of stairs.  Patient is unable to add much additional history as he appears under the influence of some form of controlled substance.   The history is provided by the patient.  Fall  This is a new problem.    Past Medical History:  Diagnosis Date  . Depression   . HIV (human immunodeficiency virus infection) ([place])   . Hypertension   . Immune deficiency disorder ([place])   . PE (pulmonary thromboembolism) ([place]) 2018  . Personality disorder ([place])   . Schizophrenia [facility])     Patient Active Problem List   Diagnosis Date Noted  . Protein C deficiency ([place]) 07/05/2017  . Benign neoplasm of transverse colon   . Duodenal ulcer with hemorrhage   . Unintentional weight loss 06/14/2017  . Encounter for palliative care   . Coagulopathy ([place])   . Abnormal CT of the abdomen   . AKI (acute kidney injury) ([place]) 06/12/2017  . Chest pain 06/12/2017  . Emphysema lung ([place]) 11/20/2016  . DVT of lower extremity, bilateral ([place]) 11/04/2016  . Tobacco abuse 11/02/2016  . PE (pulmonary thromboembolism) ([place]) 11/02/2016  . Cocaine dependence with cocaine-induced mood disorder ([place]) 03/29/2016  . HTN (hypertension) 08/25/2015  . Paranoid schizophrenia, chronic condition ([place]) 12/30/2014  . Alcohol use disorder, severe, dependence ([place]) 12/27/2014  . ERECTILE DYSFUNCTION 09/03/2007  . Human immunodeficiency virus (HIV) disease  ([place]) 11/09/2006  . CA IN SITU, RECTUM 11/09/2006  . Hypothyroidism 11/09/2006  . DISORDER, BIPOLAR NOS 11/09/2006  . Depression 11/09/2006  . PYELONEPHRITIS 11/09/2006    Past Surgical History:  Procedure Laterality Date  . COLONOSCOPY WITH PROPOFOL N/A 06/15/2017   Procedure: COLONOSCOPY WITH PROPOFOL;  Surgeon: Irene Shipper, MD;  Location: [facility] END[facility];  Service: Endoscopy;  Laterality: N/A;  . ESOPHAGOGASTRODUODENOSCOPY (EGD) WITH PROPOFOL N/A 06/15/2017   Procedure: ESOPHAGOGASTRODUODENOSCOPY (EGD) WITH PROPOFOL;  Surgeon: Irene Shipper, MD;  Location: [facility];  Service: Endoscopy;  Laterality: N/A;  . IR GENERIC HISTORICAL  11/04/2016   IR IVC FILTER PLMT / S&I Burke Keels GUID/MOD SED 11/04/2016 Aletta Edouard, MD [facility]-INTERV RAD       Home Medications    Prior to Admission medications   Medication Sig Start Date End Date Taking? Authorizing Provider  acetaminophen (TYLENOL) 500 MG tablet Take 1 tablet (500 mg total) by mouth every 6 (six) hours as needed for moderate pain. 11/07/16   Eugenie Filler, MD  apixaban (ELIQUIS) 2.5 MG TABS tablet Take 1 tablet (2.5 mg total) by mouth 2 (two) times daily. 08/28/17   Charlott Rakes, MD  benzonatate (TESSALON) 100 MG capsule Take 1 capsule (100 mg total) by mouth every 8 (eight) hours. 09/07/17   Domenic Moras, PA-C  benztropine (COGENTIN) 1 MG tablet 1 tab in the morning 2 tabs at night Patient taking differently: Take 1-2 mg by mouth See admin instructions. Take  1 tablet in the morning and take 2 tablets at night 11/10/16   Freeman Caldron M, PA-C  fluconazole (DIFLUCAN) 100 MG tablet Take 1 tablet (100 mg total) by mouth once a week. 05/31/17 12/27/17  Caren Griffins, MD  gabapentin (NEURONTIN) 600 MG tablet Take 600 mg by mouth at bedtime.    [provider]  GENVOYA 150-150-200-10 MG TABS tablet TAKE ONE TABLET BY MOUTH DAILY WITH BREAKFAST 07/20/17   Campbell Riches, MD  haloperidol (HALDOL) 10 MG tablet Take 0.5 tablets (5  mg total) by mouth 2 (two) times daily. 06/18/17   Domenic Polite, MD  lisinopril (PRINIVIL,ZESTRIL) 10 MG tablet Take 10 mg by mouth daily. 06/23/17   [provider]  lisinopril (PRINIVIL,ZESTRIL) 10 MG tablet take one tablet by MOUTH daily 09/20/17   Charlott Rakes, MD  magnesium oxide (MAG-OX) 400 (241.3 Mg) MG tablet Take 1 tablet (400 mg total) by mouth 2 (two) times daily. 11/07/16   Eugenie Filler, MD  metoprolol tartrate (LOPRESSOR) 25 MG tablet Take 0.5 tablets (12.5 mg total) by mouth 2 (two) times daily. 06/29/17   Charlott Rakes, MD  mirtazapine (REMERON) 30 MG tablet Take 1 tablet (30 mg total) by mouth at bedtime. 06/20/17   [place] Callas, NP  Multiple Vitamin (MULTIVITAMIN WITH MINERALS) TABS tablet Take 1 tablet by mouth daily. 11/08/16   Eugenie Filler, MD  pantoprazole (PROTONIX) 40 MG tablet Take 1 tablet (40 mg total) by mouth 2 (two) times daily before a meal. 06/20/17   Dixon, Melton Krebs, NP  polyethylene glycol (MIRALAX / GLYCOLAX) packet Take 17 g by mouth daily.    [provider]  promethazine-dextromethorphan (PROMETHAZINE-DM) 6.25-15 MG/5ML syrup Take 5 mLs by mouth 4 (four) times daily as needed for cough. 09/07/17   Domenic Moras, PA-C  sulfamethoxazole-trimethoprim (BACTRIM DS) 800-160 MG tablet Take 1 tablet by mouth daily. 09/19/17   [place] Callas, NP  tiotropium (SPIRIVA) 18 MCG inhalation capsule Place 1 capsule (18 mcg total) into inhaler and inhale daily. 11/08/16   Eugenie Filler, MD  traZODone (DESYREL) 50 MG tablet Take 1 tablet (50 mg total) by mouth at bedtime. 07/16/17   Charlott Rakes, MD  Valbenazine Tosylate (INGREZZA) 80 MG CAPS Take 80 mg by mouth 2 (two) times daily.     [provider]    Family History Family History  Problem Relation Age of Onset  . CAD Mother   . Kidney disease Sister   . Lupus Brother   . Cancer Maternal Grandmother   . Stroke Paternal Grandmother     Social History Social  History   Tobacco Use  . Smoking status: Current Every Day Smoker    Packs/day: 1.00    Years: 33.00    Pack years: 33.00    Types: Cigarettes  . Smokeless tobacco: Never Used  . Tobacco comment: cutting back  Substance Use Topics  . Alcohol use: Yes    Alcohol/week: 1.8 oz    Types: 3 Shots of liquor per week    Comment: per patient drinks one pint a month 06/20/17  . Drug use: No    Comment: last use summer 2017     Allergies   Amoxicillin and Latex   Review of Systems Review of Systems  Unable to perform ROS: Other     Physical Exam Updated Vital Signs BP 121/75 (BP Location: Right Arm)   Pulse 66   Temp 98.9 F (37.2 C) (Oral)   Resp  18   SpO2 98%   Physical Exam  Constitutional: He appears well-developed and well-nourished. No distress.  Patient is a thin African-American male.  He is somnolent and displays restlessness and an inability to remain still.  HENT:  Head: Normocephalic and atraumatic.  Mouth/Throat: Oropharynx is clear and moist.  Eyes: Pupils are equal, round, and reactive to light.  Neck: Normal range of motion. Neck supple.  Patient has good range of motion of the neck.  There is no cervical spine tenderness or step-off.  Cardiovascular: Normal rate and regular rhythm. Exam reveals no friction rub.  No murmur heard. Pulmonary/Chest: Effort normal and breath sounds normal. No respiratory distress. He has no wheezes. He has no rales.  Abdominal: Soft. Bowel sounds are normal. He exhibits no distension. There is no tenderness.  Musculoskeletal: Normal range of motion. He exhibits no edema.  Neurological: Coordination normal.  Patient is somnolent but arousable.  He will answer simple questions, however without much detail.  He appears tremulous and restless.  He does move all extremities.  Skin: Skin is warm and dry. He is not diaphoretic.  Nursing note and vitals reviewed.    ED Treatments / Results  Labs (all labs ordered are listed, but  only abnormal results are displayed) Labs Reviewed  BASIC METABOLIC PANEL  CBC WITH DIFFERENTIAL/PLATELET  ETHANOL    EKG  EKG Interpretation None       Radiology No results found.  Procedures Procedures (including critical care time)  Medications Ordered in ED Medications - No data to display   Initial Impression / Assessment and Plan / ED Course  I have reviewed the triage vital signs and the nursing notes.  Pertinent labs & imaging results that were available during my care of the patient were reviewed by me and considered in my medical decision making (see chart for details).  Patient brought by EMS after a fall down several steps shortly after using crack cocaine.  Patient is on Eliquis.  Head CT and cervical spine CT are both negative.  Laboratory studies are unremarkable.  Patient was observed in the emergency department overnight.  He is now awake talking and eating his breakfast with no difficulty.  I feel as though he is stable and appropriate for discharge.  Final Clinical Impressions(s) / ED Diagnoses   Final diagnoses:  None    ED Discharge Orders    None       Veryl Speak, MD 10/29/17 [phone]

## 2017-10-28 NOTE — ED Triage Notes (Signed)
Pt fell downs some stairs this afternoon, pt admits to smoking crack before this happened. Pt is a blood thinner. alertx 4 and verbal/ v/s 139/90, hr 91, sp02 98  Room air, rr20. Pt comes from home.  Pt verbalizes pain all over.

## 2017-10-29 ENCOUNTER — Encounter (HOSPITAL_COMMUNITY): Payer: Self-pay | Admitting: Emergency Medicine

## 2017-10-29 ENCOUNTER — Other Ambulatory Visit: Payer: Self-pay

## 2017-10-29 ENCOUNTER — Emergency Department (HOSPITAL_COMMUNITY)
Admission: EM | Admit: 2017-10-29 | Discharge: 2017-10-30 | Disposition: A | Discharge status: Other Acute Inpatient Hospital | Payer: Medicare Other | Attending: Emergency Medicine | Admitting: Emergency Medicine

## 2017-10-29 ENCOUNTER — Emergency Department (HOSPITAL_COMMUNITY): Payer: Medicare Other

## 2017-10-29 DIAGNOSIS — R443 Hallucinations, unspecified: Secondary | ICD-10-CM

## 2017-10-29 DIAGNOSIS — S0990XA Unspecified injury of head, initial encounter: Secondary | ICD-10-CM | POA: Diagnosis not present

## 2017-10-29 DIAGNOSIS — I10 Essential (primary) hypertension: Secondary | ICD-10-CM | POA: Diagnosis not present

## 2017-10-29 DIAGNOSIS — F200 Paranoid schizophrenia: Secondary | ICD-10-CM | POA: Insufficient documentation

## 2017-10-29 DIAGNOSIS — Z7901 Long term (current) use of anticoagulants: Secondary | ICD-10-CM | POA: Insufficient documentation

## 2017-10-29 DIAGNOSIS — Z21 Asymptomatic human immunodeficiency virus [HIV] infection status: Secondary | ICD-10-CM | POA: Diagnosis not present

## 2017-10-29 DIAGNOSIS — F17210 Nicotine dependence, cigarettes, uncomplicated: Secondary | ICD-10-CM | POA: Diagnosis not present

## 2017-10-29 DIAGNOSIS — M542 Cervicalgia: Secondary | ICD-10-CM | POA: Diagnosis not present

## 2017-10-29 DIAGNOSIS — S199XXA Unspecified injury of neck, initial encounter: Secondary | ICD-10-CM | POA: Diagnosis not present

## 2017-10-29 DIAGNOSIS — F10230 Alcohol dependence with withdrawal, uncomplicated: Secondary | ICD-10-CM | POA: Diagnosis not present

## 2017-10-29 DIAGNOSIS — R51 Headache: Secondary | ICD-10-CM | POA: Diagnosis not present

## 2017-10-29 DIAGNOSIS — F10930 Alcohol use, unspecified with withdrawal, uncomplicated: Secondary | ICD-10-CM

## 2017-10-29 DIAGNOSIS — R45851 Suicidal ideations: Secondary | ICD-10-CM

## 2017-10-29 DIAGNOSIS — Z79899 Other long term (current) drug therapy: Secondary | ICD-10-CM | POA: Insufficient documentation

## 2017-10-29 DIAGNOSIS — R440 Auditory hallucinations: Secondary | ICD-10-CM | POA: Diagnosis present

## 2017-10-29 LAB — CBC WITH DIFFERENTIAL/PLATELET
BASOS PCT: 0 %
Basophils Absolute: 0 10*3/uL (ref 0.0–0.1)
Eosinophils Absolute: 0 10*3/uL (ref 0.0–0.7)
Eosinophils Relative: 0 %
HCT: 28.8 % — ABNORMAL LOW (ref 39.0–52.0)
Hemoglobin: 9 g/dL — ABNORMAL LOW (ref 13.0–17.0)
LYMPHS ABS: 1.6 10*3/uL (ref 0.7–4.0)
LYMPHS PCT: 31 %
MCH: 25.1 pg — AB (ref 26.0–34.0)
MCHC: 31.3 g/dL (ref 30.0–36.0)
MCV: 80.2 fL (ref 78.0–100.0)
Monocytes Absolute: 0.4 10*3/uL (ref 0.1–1.0)
Monocytes Relative: 8 %
NEUTROS ABS: 3.2 10*3/uL (ref 1.7–7.7)
Neutrophils Relative %: 61 %
Platelets: 149 10*3/uL — ABNORMAL LOW (ref 150–400)
RBC: 3.59 MIL/uL — ABNORMAL LOW (ref 4.22–5.81)
RDW: 23.5 % — ABNORMAL HIGH (ref 11.5–15.5)
WBC: 5.2 10*3/uL (ref 4.0–10.5)

## 2017-10-29 LAB — RAPID URINE DRUG SCREEN, HOSP PERFORMED
Amphetamines: NOT DETECTED
Barbiturates: NOT DETECTED
Benzodiazepines: NOT DETECTED
Cocaine: POSITIVE — AB
Opiates: NOT DETECTED
Tetrahydrocannabinol: NOT DETECTED

## 2017-10-29 LAB — BASIC METABOLIC PANEL
ANION GAP: 11 (ref 5–15)
BUN: 19 mg/dL (ref 6–20)
CO2: 18 mmol/L — AB (ref 22–32)
CREATININE: 0.81 mg/dL (ref 0.61–1.24)
Calcium: 9.2 mg/dL (ref 8.9–10.3)
Chloride: 112 mmol/L — ABNORMAL HIGH (ref 101–111)
GFR calc Af Amer: >60 mL/min (ref >60)
GFR calc non Af Amer: >60 mL/min (ref >60)
GLUCOSE: 52 mg/dL — AB (ref 65–99)
Potassium: 3.9 mmol/L (ref 3.5–5.1)
Sodium: 141 mmol/L (ref 135–145)

## 2017-10-29 LAB — COMPREHENSIVE METABOLIC PANEL
ALT: 26 U/L (ref 17–63)
AST: 40 U/L (ref 15–41)
Albumin: 3.6 g/dL (ref 3.5–5.0)
Alkaline Phosphatase: 51 U/L (ref 38–126)
Anion gap: 12 (ref 5–15)
BUN: 18 mg/dL (ref 6–20)
CO2: 20 mmol/L — ABNORMAL LOW (ref 22–32)
Calcium: 9.2 mg/dL (ref 8.9–10.3)
Chloride: 108 mmol/L (ref 101–111)
Creatinine, Ser: 0.88 mg/dL (ref 0.61–1.24)
GFR calc Af Amer: >60 mL/min (ref >60)
GFR calc non Af Amer: >60 mL/min (ref >60)
Glucose, Bld: 89 mg/dL (ref 65–99)
Potassium: 4 mmol/L (ref 3.5–5.1)
Sodium: 140 mmol/L (ref 135–145)
Total Bilirubin: 1 mg/dL (ref 0.3–1.2)
Total Protein: 6.9 g/dL (ref 6.5–8.1)

## 2017-10-29 LAB — CBC
HCT: 32.9 % — ABNORMAL LOW (ref 39.0–52.0)
Hemoglobin: 10.2 g/dL — ABNORMAL LOW (ref 13.0–17.0)
MCH: 25.1 pg — ABNORMAL LOW (ref 26.0–34.0)
MCHC: 31 g/dL (ref 30.0–36.0)
MCV: 81 fL (ref 78.0–100.0)
Platelets: 134 10*3/uL — ABNORMAL LOW (ref 150–400)
RBC: 4.06 MIL/uL — ABNORMAL LOW (ref 4.22–5.81)
RDW: 23.2 % — ABNORMAL HIGH (ref 11.5–15.5)
WBC: 3.4 10*3/uL — ABNORMAL LOW (ref 4.0–10.5)

## 2017-10-29 LAB — SALICYLATE LEVEL: Salicylate Lvl: <7 mg/dL (ref 2.8–30.0)

## 2017-10-29 LAB — ETHANOL
Alcohol, Ethyl (B): <10 mg/dL (ref <10)
Alcohol, Ethyl (B): <10 mg/dL (ref <10)

## 2017-10-29 LAB — ACETAMINOPHEN LEVEL: Acetaminophen (Tylenol), Serum: <10 ug/mL — ABNORMAL LOW (ref 10–30)

## 2017-10-29 MED ORDER — ADULT MULTIVITAMIN W/MINERALS CH
1.0000 | ORAL_TABLET | Freq: Every day | ORAL | Status: DC
  Administered 2017-10-29 – 2017-10-30 (×2): 1 via ORAL
  Filled 2017-10-29 (×2): qty 1

## 2017-10-29 MED ORDER — LISINOPRIL 10 MG PO TABS
10.0000 mg | ORAL_TABLET | Freq: Every day | ORAL | Status: DC
  Administered 2017-10-29 – 2017-10-30 (×2): 10 mg via ORAL
  Filled 2017-10-29 (×2): qty 1

## 2017-10-29 MED ORDER — POLYETHYLENE GLYCOL 3350 17 G PO PACK
17.0000 g | PACK | Freq: Every day | ORAL | Status: DC
  Administered 2017-10-29 – 2017-10-30 (×2): 17 g via ORAL
  Filled 2017-10-29 (×2): qty 1

## 2017-10-29 MED ORDER — TRAZODONE HCL 50 MG PO TABS
50.0000 mg | ORAL_TABLET | Freq: Every day | ORAL | Status: DC
  Administered 2017-10-29: 50 mg via ORAL
  Filled 2017-10-29: qty 1

## 2017-10-29 MED ORDER — VITAMIN B-1 100 MG PO TABS
100.0000 mg | ORAL_TABLET | Freq: Every day | ORAL | Status: DC
  Administered 2017-10-29 – 2017-10-30 (×2): 100 mg via ORAL
  Filled 2017-10-29 (×2): qty 1

## 2017-10-29 MED ORDER — NICOTINE 21 MG/24HR TD PT24
21.0000 mg | MEDICATED_PATCH | Freq: Every day | TRANSDERMAL | Status: DC
  Administered 2017-10-29 – 2017-10-30 (×2): 21 mg via TRANSDERMAL
  Filled 2017-10-29 (×2): qty 1

## 2017-10-29 MED ORDER — APIXABAN 2.5 MG PO TABS
2.5000 mg | ORAL_TABLET | Freq: Two times a day (BID) | ORAL | Status: DC
  Administered 2017-10-29 – 2017-10-30 (×2): 2.5 mg via ORAL
  Filled 2017-10-29 (×3): qty 1

## 2017-10-29 MED ORDER — MAGNESIUM OXIDE 400 (241.3 MG) MG PO TABS
400.0000 mg | ORAL_TABLET | Freq: Two times a day (BID) | ORAL | Status: DC
  Administered 2017-10-29 – 2017-10-30 (×2): 400 mg via ORAL
  Filled 2017-10-29 (×2): qty 1

## 2017-10-29 MED ORDER — LORAZEPAM 1 MG PO TABS: 0.0000 mg | ORAL_TABLET | Freq: Two times a day (BID) | ORAL | Status: DC

## 2017-10-29 MED ORDER — THIAMINE HCL 100 MG/ML IJ SOLN: 100.0000 mg | Freq: Every day | INTRAMUSCULAR | Status: DC

## 2017-10-29 MED ORDER — LORAZEPAM 2 MG/ML IJ SOLN: 0.0000 mg | Freq: Four times a day (QID) | INTRAMUSCULAR | Status: DC

## 2017-10-29 MED ORDER — ELVITEG-COBIC-EMTRICIT-TENOFAF 150-150-200-10 MG PO TABS
1.0000 | ORAL_TABLET | Freq: Every day | ORAL | Status: DC
  Administered 2017-10-29: 1 via ORAL
  Filled 2017-10-29: qty 1

## 2017-10-29 MED ORDER — TIOTROPIUM BROMIDE MONOHYDRATE 18 MCG IN CAPS
18.0000 ug | ORAL_CAPSULE | Freq: Every day | RESPIRATORY_TRACT | Status: DC
  Administered 2017-10-29 – 2017-10-30 (×2): 18 ug via RESPIRATORY_TRACT
  Filled 2017-10-29: qty 5

## 2017-10-29 MED ORDER — LORAZEPAM 1 MG PO TABS
0.0000 mg | ORAL_TABLET | Freq: Four times a day (QID) | ORAL | Status: DC
  Administered 2017-10-29: 1 mg via ORAL
  Administered 2017-10-30: 2 mg via ORAL
  Filled 2017-10-29: qty 1
  Filled 2017-10-29: qty 2

## 2017-10-29 MED ORDER — VALBENAZINE TOSYLATE 80 MG PO CAPS: 80.0000 mg | ORAL_CAPSULE | Freq: Two times a day (BID) | ORAL | Status: DC

## 2017-10-29 MED ORDER — METOPROLOL TARTRATE 25 MG PO TABS
12.5000 mg | ORAL_TABLET | Freq: Two times a day (BID) | ORAL | Status: DC
  Filled 2017-10-29 (×2): qty 1

## 2017-10-29 MED ORDER — GABAPENTIN 600 MG PO TABS
600.0000 mg | ORAL_TABLET | Freq: Every day | ORAL | Status: DC
  Administered 2017-10-29: 600 mg via ORAL
  Filled 2017-10-29: qty 1

## 2017-10-29 MED ORDER — PANTOPRAZOLE SODIUM 40 MG PO TBEC
40.0000 mg | DELAYED_RELEASE_TABLET | Freq: Two times a day (BID) | ORAL | Status: DC
  Administered 2017-10-30 (×2): 40 mg via ORAL
  Filled 2017-10-29 (×2): qty 1

## 2017-10-29 MED ORDER — SULFAMETHOXAZOLE-TRIMETHOPRIM 800-160 MG PO TABS
1.0000 | ORAL_TABLET | Freq: Every day | ORAL | Status: DC
  Administered 2017-10-29 – 2017-10-30 (×2): 1 via ORAL
  Filled 2017-10-29 (×2): qty 1

## 2017-10-29 MED ORDER — LORAZEPAM 2 MG/ML IJ SOLN: 0.0000 mg | Freq: Two times a day (BID) | INTRAMUSCULAR | Status: DC

## 2017-10-29 MED ORDER — BENZTROPINE MESYLATE 1 MG PO TABS: 1.0000 mg | ORAL_TABLET | ORAL | Status: DC

## 2017-10-29 MED ORDER — MIRTAZAPINE 15 MG PO TABS
30.0000 mg | ORAL_TABLET | Freq: Every day | ORAL | Status: DC
  Administered 2017-10-29: 30 mg via ORAL
  Filled 2017-10-29: qty 2

## 2017-10-29 NOTE — ED Provider Notes (Signed)
[facility] EMERGENCY DEPARTMENT Provider Note   CSN: [id number] Arrival date & time: 10/29/17  1200     History   Chief Complaint Chief Complaint  Patient presents with  . Psychiatric Evaluation    HPI [name] Herring is a 57 y.o. male.  HPI 57 year old African-American male with past medical history significant for depression, HIV, hypertension, PE, personality disorder, schizophrenia that presents to the emergency department today for psychiatric evaluation.  Patient was dropped off by his caseworker.  Patient reports being off of his medications for the past 5 days.  He reports hearing voices that are telling them to kill himself and to kill other people.  Patient denies visual hallucinations.  He has not not attempted to hurt himself or others.  He does have a history in the past of suicidal attempt with hanging himself.  Patient states that he did buy a gun.  Patient reports daily alcohol use last alcohol intake was yesterday unable to quantify.  Also reports history of crack and cocaine use.  Unknown last use.  Patient also reports daily tobacco use.  He states that his voices are telling him to kill himself and hurt other people.  Denies any medical complaints at this time. Pt with hx of hiv followed by infectious disease. Last cd4 count 4 days ago was 290 which appears at his baseline.  Past Medical History:  Diagnosis Date  . Depression   . HIV (human immunodeficiency virus infection) ([place])   . Hypertension   . Immune deficiency disorder ([place])   . PE (pulmonary thromboembolism) ([place]) 2018  . Personality disorder ([place])   . Schizophrenia [facility])     Patient Active Problem List   Diagnosis Date Noted  . Protein C deficiency ([place]) 07/05/2017  . Benign neoplasm of transverse colon   . Duodenal ulcer with hemorrhage   . Unintentional weight loss 06/14/2017  . Encounter for palliative care   . Coagulopathy ([place])   . Abnormal CT of the abdomen   . AKI (acute  kidney injury) ([place]) 06/12/2017  . Chest pain 06/12/2017  . Emphysema lung ([place]) 11/20/2016  . DVT of lower extremity, bilateral ([place]) 11/04/2016  . Tobacco abuse 11/02/2016  . PE (pulmonary thromboembolism) ([place]) 11/02/2016  . Cocaine dependence with cocaine-induced mood disorder ([place]) 03/29/2016  . HTN (hypertension) 08/25/2015  . Paranoid schizophrenia, chronic condition ([place]) 12/30/2014  . Alcohol use disorder, severe, dependence ([place]) 12/27/2014  . ERECTILE DYSFUNCTION 09/03/2007  . Human immunodeficiency virus (HIV) disease ([place]) 11/09/2006  . CA IN SITU, RECTUM 11/09/2006  . Hypothyroidism 11/09/2006  . DISORDER, BIPOLAR NOS 11/09/2006  . Depression 11/09/2006  . PYELONEPHRITIS 11/09/2006    Past Surgical History:  Procedure Laterality Date  . COLONOSCOPY WITH PROPOFOL N/A 06/15/2017   Procedure: COLONOSCOPY WITH PROPOFOL;  Surgeon: Irene Shipper, MD;  Location: [facility] ENDOSCOPY;  Service: Endoscopy;  Laterality: N/A;  . ESOPHAGOGASTRODUODENOSCOPY (EGD) WITH PROPOFOL N/A 06/15/2017   Procedure: ESOPHAGOGASTRODUODENOSCOPY (EGD) WITH PROPOFOL;  Surgeon: Irene Shipper, MD;  Location: [facility] ENDOSCOPY;  Service: Endoscopy;  Laterality: N/A;  . IR GENERIC HISTORICAL  11/04/2016   IR IVC FILTER PLMT / S&I Burke Keels GUID/MOD SED 11/04/2016 Aletta Edouard, MD [facility]-INTERV RAD       Home Medications    Prior to Admission medications   Medication Sig Start Date End Date Taking? Authorizing Provider  acetaminophen (TYLENOL) 500 MG tablet Take 1 tablet (500 mg total) by mouth every 6 (six) hours as needed for moderate pain.  11/07/16   Eugenie Filler, MD  apixaban (ELIQUIS) 2.5 MG TABS tablet Take 1 tablet (2.5 mg total) by mouth 2 (two) times daily. 08/28/17   Charlott Rakes, MD  benztropine (COGENTIN) 1 MG tablet 1 tab in the morning 2 tabs at night Patient taking differently: Take 1-2 mg by mouth See admin instructions. Take 1 tablet in the morning and take 2 tablets at night 11/10/16   Freeman Caldron M, PA-C  fluconazole (DIFLUCAN) 100 MG tablet Take 1 tablet (100 mg total) by mouth once a week. 05/31/17 12/27/17  Caren Griffins, MD  gabapentin (NEURONTIN) 600 MG tablet Take 600 mg by mouth at bedtime.    [provider]  GENVOYA 150-150-200-10 MG TABS tablet TAKE ONE TABLET BY MOUTH DAILY WITH BREAKFAST 07/20/17   Campbell Riches, MD  haloperidol (HALDOL) 10 MG tablet Take 0.5 tablets (5 mg total) by mouth 2 (two) times daily. 06/18/17   Domenic Polite, MD  lisinopril (PRINIVIL,ZESTRIL) 10 MG tablet take one tablet by MOUTH daily 09/20/17   Charlott Rakes, MD  magnesium oxide (MAG-OX) 400 (241.3 Mg) MG tablet Take 1 tablet (400 mg total) by mouth 2 (two) times daily. 11/07/16   Eugenie Filler, MD  metoprolol tartrate (LOPRESSOR) 25 MG tablet Take 0.5 tablets (12.5 mg total) by mouth 2 (two) times daily. 06/29/17   Charlott Rakes, MD  mirtazapine (REMERON) 30 MG tablet Take 1 tablet (30 mg total) by mouth at bedtime. 06/20/17   Ouachita Callas, NP  Multiple Vitamin (MULTIVITAMIN WITH MINERALS) TABS tablet Take 1 tablet by mouth daily. 11/08/16   Eugenie Filler, MD  pantoprazole (PROTONIX) 40 MG tablet Take 1 tablet (40 mg total) by mouth 2 (two) times daily before a meal. 06/20/17   Dixon, Melton Krebs, NP  polyethylene glycol (MIRALAX / GLYCOLAX) packet Take 17 g by mouth daily.    [provider]  promethazine-dextromethorphan (PROMETHAZINE-DM) 6.25-15 MG/5ML syrup Take 5 mLs by mouth 4 (four) times daily as needed for cough. 09/07/17   Domenic Moras, PA-C  sulfamethoxazole-trimethoprim (BACTRIM DS) 800-160 MG tablet Take 1 tablet by mouth daily. 09/19/17   Volin Callas, NP  tiotropium (SPIRIVA) 18 MCG inhalation capsule Place 1 capsule (18 mcg total) into inhaler and inhale daily. 11/08/16   Eugenie Filler, MD  traZODone (DESYREL) 50 MG tablet Take 1 tablet (50 mg total) by mouth at bedtime. 07/16/17   Charlott Rakes, MD  Valbenazine Tosylate  (INGREZZA) 80 MG CAPS Take 80 mg by mouth 2 (two) times daily.     [provider]    Family History Family History  Problem Relation Age of Onset  . CAD Mother   . Kidney disease Sister   . Lupus Brother   . Cancer Maternal Grandmother   . Stroke Paternal Grandmother     Social History Social History   Tobacco Use  . Smoking status: Current Every Day Smoker    Packs/day: 1.00    Years: 33.00    Pack years: 33.00    Types: Cigarettes  . Smokeless tobacco: Never Used  . Tobacco comment: cutting back  Substance Use Topics  . Alcohol use: Yes    Alcohol/week: 1.8 oz    Types: 3 Shots of liquor per week    Comment: per patient drinks one pint a month 06/20/17  . Drug use: Yes    Comment: crack      Allergies   Amoxicillin; Latex; and Abilify [aripiprazole]   Review  of Systems Review of Systems  Constitutional: Negative for chills and fever.  HENT: Negative for congestion.   Eyes: Negative for visual disturbance.  Respiratory: Negative for shortness of breath.   Cardiovascular: Negative for chest pain.  Gastrointestinal: Negative for abdominal pain, diarrhea, nausea and vomiting.  Genitourinary: Negative for frequency and urgency.  Musculoskeletal: Negative for myalgias.  Skin: Negative for rash.  Neurological: Negative for dizziness, light-headedness and headaches.  Psychiatric/Behavioral: Positive for agitation, hallucinations, sleep disturbance and suicidal ideas.     Physical Exam Updated Vital Signs BP (!) 132/106   Pulse 72   Temp 98.7 F (37.1 C) (Oral)   Resp 18   Ht 5\' 9"  (1.753 m)   Wt 57.2 kg (126 lb)   SpO2 100%   BMI 18.61 kg/m   Physical Exam  Constitutional: He is oriented to person, place, and time. He appears well-developed and well-nourished. No distress.  HENT:  Head: Normocephalic and atraumatic.  Eyes: Conjunctivae are normal. Pupils are equal, round, and reactive to light. Right eye exhibits no discharge. Left eye  exhibits no discharge. No scleral icterus.  Neck: Normal range of motion. Neck supple.  Cardiovascular: Normal rate, regular rhythm, normal heart sounds and intact distal pulses. Exam reveals no gallop and no friction rub.  No murmur heard. Pulmonary/Chest: Effort normal and breath sounds normal. No stridor. No respiratory distress. He has no wheezes. He has no rales. He exhibits no tenderness.  Abdominal: Soft. Bowel sounds are normal.  Musculoskeletal: Normal range of motion.  Neurological: He is alert and oriented to person, place, and time. GCS eye subscore is 4. GCS verbal subscore is 5. GCS motor subscore is 6.  He will answer simple questions.  He appears tremulous and restless.  He does move all extremities.   The patient is alert, attentive, and oriented x 3. Speech is clear. Cranial nerve II-VII grossly intact. Negative pronator drift. Sensation intact. Strength 5/5 in all extremities. Reflexes 2+ and symmetric at biceps, triceps, knees, and ankles. Rapid alternating movement and fine finger movements intact.    Skin: Skin is warm and dry. Capillary refill takes less than 2 seconds. No pallor.  Psychiatric: His speech is normal. Judgment normal. His mood appears anxious. He is agitated. Thought content is paranoid. Cognition and memory are normal. He expresses homicidal and suicidal ideation. He expresses suicidal plans and homicidal plans.  Nursing note and vitals reviewed.    ED Treatments / Results  Labs (all labs ordered are listed, but only abnormal results are displayed) Labs Reviewed  COMPREHENSIVE METABOLIC PANEL - Abnormal; Notable for the following components:      Result Value   CO2 20 (*)    All other components within normal limits  ACETAMINOPHEN LEVEL - Abnormal; Notable for the following components:   Acetaminophen (Tylenol), Serum <10 (*)    All other components within normal limits  CBC - Abnormal; Notable for the following components:   WBC 3.4 (*)    RBC  4.06 (*)    Hemoglobin 10.2 (*)    HCT 32.9 (*)    MCH 25.1 (*)    RDW 23.2 (*)    Platelets 134 (*)    All other components within normal limits  RAPID URINE DRUG SCREEN, HOSP PERFORMED - Abnormal; Notable for the following components:   Cocaine POSITIVE (*)    All other components within normal limits  ETHANOL  SALICYLATE LEVEL    EKG  EKG Interpretation None  Radiology Ct Head Wo Contrast  Result Date: 10/29/2017 CLINICAL DATA:  Status post fall down the stairs. Patient on blood thinners. Generalized head and neck pain. EXAM: CT HEAD WITHOUT CONTRAST CT CERVICAL SPINE WITHOUT CONTRAST TECHNIQUE: Multidetector CT imaging of the head and cervical spine was performed following the standard protocol without intravenous contrast. Multiplanar CT image reconstructions of the cervical spine were also generated. COMPARISON:  CT of the head and cervical spine performed 06/12/2017, and MRI of the brain performed 12/02/2016 FINDINGS: CT HEAD FINDINGS Brain: No evidence of acute infarction, hemorrhage, hydrocephalus, extra-axial collection or mass lesion / mass effect. Prominence of the ventricles and sulci reflects mild to moderate cortical volume loss. Evaluation is mildly suboptimal due to motion artifact and beam hardening artifact. Periventricular matter change likely reflects small vessel ischemic microangiopathy. The brainstem and fourth ventricle are within normal limits. The basal ganglia are unremarkable in appearance. The cerebral hemispheres demonstrate grossly normal gray-white differentiation. No mass effect or midline shift is seen. Vascular: No hyperdense vessel or unexpected calcification. Skull: No free fluid is identified within the pelvis. The colon is unremarkable in appearance; the appendix is normal in caliber, and contains air. Sinuses/Orbits: The orbits are within normal limits. The paranasal sinuses and mastoid air cells are well-aerated. Other: No significant soft tissue  abnormalities are seen. CT CERVICAL SPINE FINDINGS Alignment: Normal. Skull base and vertebrae: No acute fracture. No primary bone lesion or focal pathologic process. Soft tissues and spinal canal: No prevertebral fluid or swelling. No visible canal hematoma. Disc levels: Mild intervertebral disc space narrowing is noted at C4-C5, with a posterior disc osteophyte complex. Upper chest: Emphysema is noted at the lung apices. The thyroid gland is grossly unremarkable in appearance. Calcification is noted at the right carotid bifurcation. Other: No additional soft tissue abnormalities are seen. IMPRESSION: 1. No evidence of traumatic intracranial injury or fracture. 2. No evidence of fracture or subluxation along the cervical spine. 3. Mild to moderate cortical volume loss and scattered small vessel ischemic microangiopathy. 4. Minimal degenerative change at the mid cervical spine. 5. Emphysema at the lung apices. 6. Calcification at the right carotid bifurcation. Carotid ultrasound could be considered for further evaluation, when and as deemed clinically appropriate. Electronically Signed   By: Garald Balding M.D.   On: 10/29/2017 00:51   Ct Cervical Spine Wo Contrast  Result Date: 10/29/2017 CLINICAL DATA:  Status post fall down the stairs. Patient on blood thinners. Generalized head and neck pain. EXAM: CT HEAD WITHOUT CONTRAST CT CERVICAL SPINE WITHOUT CONTRAST TECHNIQUE: Multidetector CT imaging of the head and cervical spine was performed following the standard protocol without intravenous contrast. Multiplanar CT image reconstructions of the cervical spine were also generated. COMPARISON:  CT of the head and cervical spine performed 06/12/2017, and MRI of the brain performed 12/02/2016 FINDINGS: CT HEAD FINDINGS Brain: No evidence of acute infarction, hemorrhage, hydrocephalus, extra-axial collection or mass lesion / mass effect. Prominence of the ventricles and sulci reflects mild to moderate cortical volume  loss. Evaluation is mildly suboptimal due to motion artifact and beam hardening artifact. Periventricular matter change likely reflects small vessel ischemic microangiopathy. The brainstem and fourth ventricle are within normal limits. The basal ganglia are unremarkable in appearance. The cerebral hemispheres demonstrate grossly normal gray-white differentiation. No mass effect or midline shift is seen. Vascular: No hyperdense vessel or unexpected calcification. Skull: No free fluid is identified within the pelvis. The colon is unremarkable in appearance; the appendix is normal in caliber, and contains  air. Sinuses/Orbits: The orbits are within normal limits. The paranasal sinuses and mastoid air cells are well-aerated. Other: No significant soft tissue abnormalities are seen. CT CERVICAL SPINE FINDINGS Alignment: Normal. Skull base and vertebrae: No acute fracture. No primary bone lesion or focal pathologic process. Soft tissues and spinal canal: No prevertebral fluid or swelling. No visible canal hematoma. Disc levels: Mild intervertebral disc space narrowing is noted at C4-C5, with a posterior disc osteophyte complex. Upper chest: Emphysema is noted at the lung apices. The thyroid gland is grossly unremarkable in appearance. Calcification is noted at the right carotid bifurcation. Other: No additional soft tissue abnormalities are seen. IMPRESSION: 1. No evidence of traumatic intracranial injury or fracture. 2. No evidence of fracture or subluxation along the cervical spine. 3. Mild to moderate cortical volume loss and scattered small vessel ischemic microangiopathy. 4. Minimal degenerative change at the mid cervical spine. 5. Emphysema at the lung apices. 6. Calcification at the right carotid bifurcation. Carotid ultrasound could be considered for further evaluation, when and as deemed clinically appropriate. Electronically Signed   By: Garald Balding M.D.   On: 10/29/2017 00:51    Procedures Procedures  (including critical care time)  Medications Ordered in ED Medications - No data to display   Initial Impression / Assessment and Plan / ED Course  I have reviewed the triage vital signs and the nursing notes.  Pertinent labs & imaging results that were available during my care of the patient were reviewed by me and considered in my medical decision making (see chart for details).     Patient presents to the ED today for mental evaluation.  Patient complains of auditory visual hallucinations with SI.  Patient states that he did buy a gun and attempt to hurt himself and others.  Patient reports being off of his psychiatric medications for the past 5 days.  Patient denies any other complaints at this time.  He does report daily alcohol use with last alcohol was yesterday.  Patient seen by provider last night for a fall on Eliquis.  Negative CT scan at that time.  Was discharged home in satisfactory condition.  Patient is overall well-appearing and nontoxic.  He is tremulous however this seems to be baseline with a history of tar dive dyskinesia.  Patient was shuffled gait with history of same.  Vital signs are reassuring.  Heart regular rate and rhythm.  Lungs clear to auscultation bilaterally.  Patient is tremulous.  Screening lab work is reassuring.  Patient with hemoglobin of 10.2 which is his baseline for patient.  Does have a thrombocytopenia with history of same.  Leukopenia noted 3.4 with history of same.  Tylenol level is normal.  Salicylate level is normal.  Ethanol is less than 10.  CMP is reassuring.  No liver abnormalities.  Creatinine is normal.  UDS positive for cocaine.  Patient does appear to be somewhat withdrawing from alcohol.  Patient has no signs of delirium.  See was score was 10.  Patient given Ativan.  Repeat assessment shows patient sleeping to see was score of 0.  Patient has been able to ambulate to bathroom into the phone several times.  He remains alert and oriented.   Ativan has helped with patient's withdrawal symptoms.  I did repeat head CT given recent fall and on Eliquis.  Still patient has no signs of head bleed.  TTS was consulted who recommends inpatient treatment.  At this time I feel the patient can be medically cleared for TTS  disposition to inpatient facility.  Again on repeat assessment patient remains hemodynamically stable and in no acute distress sleeping.  Patient will need to see a wall while inpatient.  Discussed with my attending who is agreed with the above plan.  Final Clinical Impressions(s) / ED Diagnoses   Final diagnoses:  Hallucinations  Suicidal ideation  Alcohol withdrawal syndrome without complication [facility])    ED Discharge Orders    None       Doristine Devoid, PA-C 10/29/17 2020    Doristine Devoid, PA-C 10/29/17 2024    Pixie Casino, MD 10/29/17 2024

## 2017-10-29 NOTE — ED Notes (Signed)
Patient received a phone call from a family member, and PT. Walked to desk w/ sitter assist.
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
## 2017-10-29 NOTE — ED Notes (Signed)
Pt taken to ct 

## 2017-10-29 NOTE — ED Notes (Signed)
Pt back from CT and in restroom with sitter.

## 2017-10-29 NOTE — ED Notes (Signed)
TTS at the bedside. 

## 2017-10-29 NOTE — Discharge Instructions (Signed)
Continue your medications as previously prescribed. ° °Follow-up with your primary doctor as needed. °
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
## 2017-10-29 NOTE — ED Notes (Signed)
Regular Diet was ordered for Dinner. 

## 2017-10-29 NOTE — ED Notes (Signed)
Patient ambulatory to the bathroom X2 with sitter at his side

## 2017-10-29 NOTE — ED Triage Notes (Addendum)
Patient was dropped of by his case worker to have a psychiatric evaluation. Patient states hearing voices to kill himself and kill others.  Patient alert answering and following commands appropriate. Patient currently calm instant motion while in wheelchair moving head and arms. Patient stated smoked crack yesterday.

## 2017-10-29 NOTE — ED Notes (Signed)
Patient given a bus pass.

## 2017-10-29 NOTE — Progress Notes (Signed)
Patient meets inpatient treatment criteria per NP Elmarie Shiley, on 2/04.  Referrals have been sent to the following inpatient treatment facilities: [place]   CSW in disposition will continue to follow up with referrals in the morning.  Verlon Setting, MSW, [place] Clinical social worker in [place] [facility], [place] Office [phone] and [phone] 10/29/2017 9:50 PM

## 2017-10-30 ENCOUNTER — Emergency Department (HOSPITAL_COMMUNITY): Payer: Medicare Other

## 2017-10-30 DIAGNOSIS — R079 Chest pain, unspecified: Secondary | ICD-10-CM | POA: Diagnosis not present

## 2017-10-30 NOTE — ED Notes (Signed)
Pt being transported via w/c to X-ray. Sitter w/pt.

## 2017-10-30 NOTE — ED Notes (Signed)
Daughter leaving at this time - pt returned to sleeping.

## 2017-10-30 NOTE — ED Notes (Addendum)
Pt attempted to call apartment complex - [name] Herring - [phone] - to request for them to allow ACT team from [facility] into his apartment either today or tomorrow so they may obtain his medications and bring them to the hospital - left message. RN called and left message for [name] Herring apartment complex office [phone] - as pt had given incorrect number.

## 2017-10-30 NOTE — ED Notes (Signed)
Pt requested for [name] not to be contacted - states ACT Team, Jackelyn Poling, may come to ED to pick up his house key and go to his home to obtain his meds. Jarrett Soho, SW, aware.

## 2017-10-30 NOTE — ED Notes (Signed)
Dr Thomasene Lot reviewed pt's CXR - advised OK.

## 2017-10-30 NOTE — ED Notes (Signed)
Patient was given a snack and drink, and A Regular Diet was ordered for Lunch. 

## 2017-10-30 NOTE — BH Assessment (Signed)
Reassessment note: Patient sitting on side of hospital bed. He was pleasant but reports feeling "bad" and worse than yesterday. He reports he is a danger to himself and others. Pt continues to meets inpatient treatment criteria.   Referrals have been sent to the following inpatient treatment facilities: [place]

## 2017-10-30 NOTE — Progress Notes (Addendum)
LCSW following for disposition of needs. Patient has been referred to [facility] and accepted pending if patient can bring his medication from home for dx of 042.  LCSW called RN at [facility] to ask patient if he brought any medication to hospital in which he did not. Patient reports we can call his daughter: [name] Herring [phone] regarding assistance in picking up medicine at his home and bringing to hospital.  Patient reports daughter is aware of his dx and treatment for medication. RN was witness to consent.  Call placed to [facility] who reports she has already left [place] for the day and lives in [place].  She reports she will not come back to [facility] until Friday and she does not have a key to his house, thus she is unable to provide assistance for medication.  Daughter gives contact of an Aunt: [name] Herring but is unclear if patient wants her to be called.   Call placed back to RN for permission to speak with another family member about medication.  Patient refuses to let LCSW call Aunt at this time.  He does agree for [employer] to call [employer] [facility]  [phone].  Call placed to team with regards to obtain medications from home. ACT team feels they can help with medication however not today, but tomorrow.   Call placed to [facility] in effort to see if we can admit patient with known ACT team bringing medication from home on 2/6.  Message left for [facility] in intake and will follow up.   Plan:  ACT team has been given permission from patient to go to home and pick up medication needed for placement on 10/31/17.  [facility] will hold the bed for patient for 10/31/17 per [place]. Patient cannot admit to [place] until medication is in hand of patient and taken to facility. RN becky is aware of plan. Patient to call his apartment complex in effort to have ACT team pick up medication.   Lane Hacker, MSW Clinical Social Work: [employer] Coverage for :  [phone]

## 2017-10-30 NOTE — ED Notes (Signed)
Pt ambulated to the bathroom with this RN and sitter. Pt unsteady on feet. Pt placed in wheel chair after using the bathroom and brought back to room.

## 2017-10-30 NOTE — ED Notes (Addendum)
Daughter, Makar Slatter, [phone] - visiting w/pt. Pt requested for RN to advise her as to tx plan - inpt. She requested to be notified when pt has placement - advised her staff will assist pt w/calling her. Voiced understanding.

## 2017-10-30 NOTE — ED Notes (Signed)
Re-TTS completed.  

## 2017-10-30 NOTE — ED Notes (Signed)
Dr Thomasene Lot in w/pt.

## 2017-10-30 NOTE — ED Notes (Signed)
Regular breakfast tray ordered.  

## 2017-10-30 NOTE — ED Notes (Signed)
Home meds inventoried.

## 2017-10-30 NOTE — ED Notes (Signed)
Pt returned to room - Sitter w/pt.

## 2017-10-30 NOTE — ED Notes (Signed)
[name] Herring apartment complex, called and advised she will leave a lock box on door of apartment - code [phone] - "press black button down and key will drop out" so ACT may pick up pt's meds. Beverlee Nims, ACT, aware and pt advised Beverlee Nims his meds should be located in front room on sewing machine. Beverlee Nims will bring to ED.

## 2017-10-30 NOTE — ED Notes (Signed)
[name], daughter, aware pt accepted to [facility] - as per pt's request.

## 2017-10-30 NOTE — ED Notes (Signed)
Pt advised may call his daughter, Holland Commons, and request that she bring his HIV med - states she is aware that he has HIV and he is on Valbenazine for this.

## 2017-10-30 NOTE — ED Notes (Signed)
[name] Herring, [name], ACT brought pt's meds. Pt [facility] - Dr Orlene Och accepting. May be transported today. Call report to [phone].

## 2017-10-30 NOTE — ED Notes (Signed)
Regular Diet ordered for Dinner. 

## 2017-10-31 DIAGNOSIS — F250 Schizoaffective disorder, bipolar type: Secondary | ICD-10-CM | POA: Diagnosis not present

## 2017-11-01 ENCOUNTER — Ambulatory Visit: Payer: Medicare Other | Admitting: Infectious Diseases

## 2017-11-01 DIAGNOSIS — F250 Schizoaffective disorder, bipolar type: Secondary | ICD-10-CM | POA: Diagnosis not present

## 2017-11-03 DIAGNOSIS — F250 Schizoaffective disorder, bipolar type: Secondary | ICD-10-CM | POA: Diagnosis not present

## 2017-11-05 DIAGNOSIS — F250 Schizoaffective disorder, bipolar type: Secondary | ICD-10-CM | POA: Diagnosis not present

## 2017-11-06 DIAGNOSIS — F250 Schizoaffective disorder, bipolar type: Secondary | ICD-10-CM | POA: Diagnosis not present

## 2017-11-09 DIAGNOSIS — F250 Schizoaffective disorder, bipolar type: Secondary | ICD-10-CM | POA: Diagnosis not present

## 2017-11-10 DIAGNOSIS — F250 Schizoaffective disorder, bipolar type: Secondary | ICD-10-CM | POA: Diagnosis not present

## 2017-11-12 ENCOUNTER — Telehealth: Payer: Self-pay | Admitting: *Deleted

## 2017-11-12 NOTE — Progress Notes (Signed)
Home visit made with [name] Herring today for a possible recertification of home visit and assistance with medication adherence. [name] Herring states he is taking his medications just fine and does not need any assistance with medication adherence. He does state that he is struggling with alcoholism and thought of reusing crack cocaine. He asked that I check on him from time to time just for support with stating in care. Currently he will not commit to rehab but would like me to check in with him.   Requesting additional orders for [name]. Herring  Order received on 10/18/17 by Cr Megan Salon to evaluate patient for [facility]).  Initial contact with attempted on 06/13/17 with 1st home visit on 06/20/17 after Discharge from the hospitalPatient was consented to care at this time.  Initial Assessment Points to Consider for Care  Points are not all inclusive to services and educated provided but supports the patient's Individualized Plan of Care.  . Is Home safe for visits? Yes / Are all firearms or weapons secure? yes . Insurance Coverage: Medicare . 1st HIV Diagnosis: Nov 1999 . Mode of HIV Transmission: MSM . Functional Status: able to perform ADL's independently with additional time given . Current Housing/Needs: no housing needs at this time, lives in an apartment alone  . Social Support/System: Dtr who lives in [place] in [place]) no longer sister(Barbara) . Culture/Religion/Spirituality:none noted that would create barriers to care . Educational Background:  . Legal Issues: none noted . Access/Utilization of Community Resources:27% no show rate . Mental Health Concerns/Diagnosis: Schizoaffective disorder . Alcohol and/or Drug Use: Hx of cocaine abuse . Risk and Knowledge of HIV and Reduction in Transmission:  Very knowledgeable able mode of transmission and ways to reduce transmission Nutritional Needs: not at this time    Frequency / Duration of [facility]  visits: Effective 10/18/17  13mo1, 9mo2, 12mo1  4PRN's for complications with disease process/progression, medication changes or concerns   [facility] will assess for learning needs related to diagnosis and treatment regimen, provide education as needed, fill pill box if needed, address any barriers which may be preventing medication compliance, and communicating with care team including physician and case manager.   Individualized Plan Of Care with Certification Period from 10/18/17  to 09/18/17  a. Type of service(s) and care to be delivered: RN Case Management  b. Frequency and duration of service: Effective 10/18/17  31mo1, 65mo2, 30mo1, 4 prns for complications with disease process/progression, medication changes or concerns . Visits/Contact may be conducted  telephonically or in person to best suit the patient.  c. Activity restrictions: Pt may be up as tolerated and can safely ambulate without the need for a assistive device. Additional time does need to be given to complete task   d. Safety Measures: Standard Precautions/Infection Control   e. Service Objectives and Goals: Service Objectives are to assist the pt with HIV medication regimen adherence and staying in care with the Infectious  Disease Clinic by identifying barriers to care. RN will address the  barriers that are identified by the patient. Current the patient is capable to taking his own medications but states he would like help and reminders with his care. Patient states he is struggling with alcoholism and thoughts of abusing drugs and would like to have a support person to engage with him. Declines rehab at this time    f. Equipment required: No additional equipment needs at this time   g. Functional Limitations: Vision. Pt has corrective glasses  that he wears   h. Rehabilitation potential: Guarded   i. Diet and Nutritional Needs: Regular Diet   j. Medications and treatments: Medications have been reconciled and reviewed and are a part of  EPIC electronic file   k. Specific therapies if needed: RN   l. Pertinent diagnoses: HIV disease,  Hx of medication NonCompliance, Schizoaffective disorder  m. Expected outcome: Guarded

## 2017-11-12 NOTE — Telephone Encounter (Signed)
I do not see the plan of care attached.

## 2017-11-12 NOTE — Telephone Encounter (Signed)
I approve of this plan of care. 

## 2017-11-12 NOTE — Telephone Encounter (Signed)
Apologize about that.  Order received on 10/18/17 by Cr Megan Salon to evaluate patient for [facility]).  Initial contact with attempted on 06/13/17 with 1st home visit on 06/20/17 after Discharge from the hospitalPatient was consented to care at this time.  Initial Assessment Points to Consider for Care             Points are not all inclusive to services and educated provided but supports the patient's Individualized Plan of Care.   Is Home safe for visits? Yes / Are all firearms or weapons secure? yes  Insurance Coverage: Medicare  1st HIV Diagnosis: Nov 1999  Mode of HIV Transmission: MSM  Functional Status: able to perform ADL's independently with additional time given  Current Housing/Needs: no housing needs at this time, lives in an apartment alone   Social Support/System: Dtr who lives in [place] in [place]) no longer sister(Barbara)  Culture/Religion/Spirituality:none noted that would create barriers to care  Educational Background:   Legal Issues: none noted  Access/Utilization of Community Resources:27% no show rate  Mental Health Concerns/Diagnosis: Schizoaffective disorder  Alcohol and/or Drug Use: Hx of cocaine abuse  Risk and Knowledge of HIV and Reduction in Transmission:  Very knowledgeable able mode of transmission and ways to reduce transmission Nutritional Needs: not at this time    Frequency / Duration of [place] visits: Effective 10/18/17  78mo1, 77mo2, 36mo1  4PRN's for complications with disease process/progression, medication changes or concerns   CBHCN will assess for learning needs related to diagnosis and treatment regimen, provide education as needed, fill pill box if needed, address any barriers which may be preventing medication compliance, and communicating with care team including physician and case manager.   Individualized Plan Of Care with Certification Period from 10/18/17  to 09/18/17      a. Type of service(s) and care to be delivered: RN Case Management             b. Frequency and duration of service: Effective 10/18/17  93mo1, 35mo2, 86mo1, 4 prns for complications with disease process/progression, medication changes or concerns . Visits/Contact may be conducted   telephonically or in person to best suit the patient.             c. Activity restrictions: Pt may be up as tolerated and can safely ambulate without the need for a assistive device. Additional time does need to be given to complete task              d. Safety Measures: Standard Precautions/Infection Control              e. Service Objectives and Goals: Service Objectives are to assist the pt with HIV medication regimen adherence and staying in care with the Infectious            Disease Clinic by identifying barriers to care. RN will address the     barriers that are identified by the patient. Current the patient is capable to taking his own medications but states he would like help and reminders with his care. Patient states he is struggling with alcoholism and thoughts of abusing drugs and would like to have a support person to engage with him. Declines rehab at this time               f. Equipment required: No additional equipment needs at this time              g. Functional Limitations: Vision. Pt has  corrective glasses that he wears              h. Rehabilitation potential: Guarded              i. Diet and Nutritional Needs: Regular Diet              j. Medications and treatments: Medications have been reconciled and reviewed and are a part of EPIC electronic file              k. Specific therapies if needed: RN              l. Pertinent diagnoses: HIV disease,  Hx of medication NonCompliance, Schizoaffective disorder             m. Expected outcome: Guarded

## 2017-11-13 DIAGNOSIS — F250 Schizoaffective disorder, bipolar type: Secondary | ICD-10-CM | POA: Diagnosis not present

## 2017-11-14 ENCOUNTER — Other Ambulatory Visit: Payer: Self-pay | Admitting: Family Medicine

## 2017-11-14 DIAGNOSIS — I10 Essential (primary) hypertension: Secondary | ICD-10-CM

## 2017-11-21 ENCOUNTER — Ambulatory Visit: Payer: Self-pay | Admitting: *Deleted

## 2017-11-21 DIAGNOSIS — B20 Human immunodeficiency virus [HIV] disease: Secondary | ICD-10-CM

## 2017-11-21 DIAGNOSIS — F1424 Cocaine dependence with cocaine-induced mood disorder: Secondary | ICD-10-CM

## 2017-11-21 NOTE — Progress Notes (Signed)
Home visit made today and during the visit [name]. [name] Herring stated he is having trouble getting his Genvoya refilled. His mental health medications are bubble packed thorough [employer]. His BP medications and Viral medications are filled with [place] our of [facility]. Contacted [facility] pharmacy and spoke with the pharmacist who stated she has been billing his account for the last 2 months because his MCD is no longer active. The pharmacist stated she has spoken with [name] Herring several times about contacting MCD to try and get his coverage reinstated. His current balance with the pharmacy is 155 dollars. I contacted [facility].   [name] Herring also stated he received cannot afford the rent at his current apartment complex and needs    [phone] ACT team called and made the aware of the above information as well and that [name] Herring has lost his ID.

## 2017-11-26 ENCOUNTER — Ambulatory Visit: Payer: Medicare Other | Attending: Family Medicine | Admitting: Family Medicine

## 2017-11-26 ENCOUNTER — Encounter: Payer: Self-pay | Admitting: Family Medicine

## 2017-11-26 ENCOUNTER — Ambulatory Visit: Payer: Self-pay | Admitting: Family Medicine

## 2017-11-26 VITALS — BP 172/91 | HR 97 | Temp 98.1°F | Ht 69.0 in | Wt 142.6 lb

## 2017-11-26 DIAGNOSIS — Z9889 Other specified postprocedural states: Secondary | ICD-10-CM | POA: Insufficient documentation

## 2017-11-26 DIAGNOSIS — F209 Schizophrenia, unspecified: Secondary | ICD-10-CM | POA: Diagnosis not present

## 2017-11-26 DIAGNOSIS — D6859 Other primary thrombophilia: Secondary | ICD-10-CM

## 2017-11-26 DIAGNOSIS — F200 Paranoid schizophrenia: Secondary | ICD-10-CM | POA: Insufficient documentation

## 2017-11-26 DIAGNOSIS — B20 Human immunodeficiency virus [HIV] disease: Secondary | ICD-10-CM | POA: Diagnosis not present

## 2017-11-26 DIAGNOSIS — Z79899 Other long term (current) drug therapy: Secondary | ICD-10-CM | POA: Diagnosis not present

## 2017-11-26 DIAGNOSIS — Z880 Allergy status to penicillin: Secondary | ICD-10-CM | POA: Insufficient documentation

## 2017-11-26 DIAGNOSIS — Z86718 Personal history of other venous thrombosis and embolism: Secondary | ICD-10-CM | POA: Diagnosis not present

## 2017-11-26 DIAGNOSIS — I10 Essential (primary) hypertension: Secondary | ICD-10-CM | POA: Insufficient documentation

## 2017-11-26 MED ORDER — APIXABAN 5 MG PO TABS: 5.0000 mg | ORAL_TABLET | Freq: Two times a day (BID) | ORAL | 3 refills | Status: DC

## 2017-11-26 MED ORDER — METOPROLOL TARTRATE 25 MG PO TABS: 25.0000 mg | ORAL_TABLET | Freq: Two times a day (BID) | ORAL | 1 refills | Status: DC

## 2017-11-26 MED ORDER — TIOTROPIUM BROMIDE MONOHYDRATE 18 MCG IN CAPS: 18.0000 ug | ORAL_CAPSULE | Freq: Every day | RESPIRATORY_TRACT | 6 refills | Status: DC

## 2017-11-26 MED ORDER — LISINOPRIL 10 MG PO TABS: 10.0000 mg | ORAL_TABLET | Freq: Every day | ORAL | 1 refills | Status: DC

## 2017-11-26 NOTE — Patient Instructions (Signed)

## 2017-11-26 NOTE — Progress Notes (Signed)
Subjective:  Patient ID: [name] Herring, male    DOB: 12-Feb-1961  Age: 57 y.o. MRN: [id number]  CC: Hypertension   HPI [name] Herring  Is a 57 year old male with a history of hypertension, HIV (CD4 count of 290 currently on antiretroviral therapy), bilateral submassive pulmonary embolism, acute DVT of branch of the R Gastrocnemius vein, left lower extremity acute mobile thrombus of the proximal popliteal vein,acute DVT of the  distal to mid posterior tibial vein with right heart strain, placement of IVC filter in 10/2016, protein C deficiency, GI bleed secondary to duodenal ulcer here for follow-up visit.  His blood pressure is elevated and he endorses taking only one antihypertensive. Metoprolol had been previously held due to hypotension. He denies bruising or GI bleed.  He had an inpatient Psych hospitalization last month due to suicidal ideations after he had been non compliant with his medications but today denies such. He would like medications for "sexual dysfunction" as he is unable to achieve an erection.  Past Medical History:  Diagnosis Date  . Depression   . HIV (human immunodeficiency virus infection) ([place])   . Hypertension   . Immune deficiency disorder ([place])   . PE (pulmonary thromboembolism) ([place]) 2018  . Personality disorder ([place])   . Schizophrenia [facility])     Past Surgical History:  Procedure Laterality Date  . COLONOSCOPY WITH PROPOFOL N/A 06/15/2017   Procedure: COLONOSCOPY WITH PROPOFOL;  Surgeon: Irene Shipper, MD;  Location: [facility];  Service: Endoscopy;  Laterality: N/A;  . ESOPHAGOGASTRODUODENOSCOPY (EGD) WITH PROPOFOL N/A 06/15/2017   Procedure: ESOPHAGOGASTRODUODENOSCOPY (EGD) WITH PROPOFOL;  Surgeon: Irene Shipper, MD;  Location: [facility];  Service: Endoscopy;  Laterality: N/A;  . IR GENERIC HISTORICAL  11/04/2016   IR IVC FILTER PLMT / S&I Burke Keels GUID/MOD SED 11/04/2016 Aletta Edouard, MD [facility]-INTERV RAD    Allergies  Allergen Reactions  .  Amoxicillin Shortness Of Breath and Rash    Has patient had a PCN reaction causing immediate rash, facial/tongue/throat swelling, [name] Has patient had a PCN reaction causing severe rash involving mucus membranes or skin necrosis: no Has patient had a PCN reaction that required hospitalization: yes drs office visit Has patient had a PCN reaction occurring within the last 10 years: no If all of the above answers are "NO", then may proceed with Cephalosporin use.   . Latex Shortness Of Breath  . Abilify [Aripiprazole] Other (See Comments)    Double vision     Outpatient Medications Prior to Visit  Medication Sig Dispense Refill  . benztropine (COGENTIN) 1 MG tablet 1 tab in the morning 2 tabs at night (Patient taking differently: Take 1-2 mg by mouth See admin instructions. Take 1 tablet in the morning and take 2 tablets at night) 14 tablet 0  . fluconazole (DIFLUCAN) 100 MG tablet Take 1 tablet (100 mg total) by mouth once a week. 30 tablet 0  . gabapentin (NEURONTIN) 600 MG tablet Take 600 mg by mouth at bedtime.    . GENVOYA 150-150-200-10 MG TABS tablet TAKE ONE TABLET BY MOUTH DAILY WITH BREAKFAST 30 tablet 6  . haloperidol (HALDOL) 10 MG tablet Take 0.5 tablets (5 mg total) by mouth 2 (two) times daily.    . magnesium oxide (MAG-OX) 400 (241.3 Mg) MG tablet Take 1 tablet (400 mg total) by mouth 2 (two) times daily. 60 tablet 0  . mirtazapine (REMERON) 30 MG tablet Take 1 tablet (30 mg total) by mouth at  bedtime. 30 tablet 3  . Multiple Vitamin (MULTIVITAMIN WITH MINERALS) TABS tablet Take 1 tablet by mouth daily.    . pantoprazole (PROTONIX) 40 MG tablet Take 1 tablet (40 mg total) by mouth 2 (two) times daily before a meal. 60 tablet 1  . polyethylene glycol (MIRALAX / GLYCOLAX) packet Take 17 g by mouth daily.    Marland Kitchen sulfamethoxazole-trimethoprim (BACTRIM DS) 800-160 MG tablet Take 1 tablet by mouth daily. 30 tablet 1  . traZODone (DESYREL) 50 MG  tablet Take 1 tablet (50 mg total) by mouth at bedtime. 30 tablet 3  . Valbenazine Tosylate (INGREZZA) 80 MG CAPS Take 80 mg by mouth 2 (two) times daily.     Marland Kitchen apixaban (ELIQUIS) 2.5 MG TABS tablet Take 1 tablet (2.5 mg total) by mouth 2 (two) times daily. 60 tablet 2  . lisinopril (PRINIVIL,ZESTRIL) 10 MG tablet take one tablet by MOUTH daily 90 tablet 1  . metoprolol tartrate (LOPRESSOR) 25 MG tablet TAKE ONE TABLET BY MOUTH TWICE A DAY 60 tablet 2  . acetaminophen (TYLENOL) 500 MG tablet Take 1 tablet (500 mg total) by mouth every 6 (six) hours as needed for moderate pain. 30 tablet 0  . promethazine-dextromethorphan (PROMETHAZINE-DM) 6.25-15 MG/5ML syrup Take 5 mLs by mouth 4 (four) times daily as needed for cough. (Patient not taking: Reported on 11/26/2017) 150 mL 0  . tiotropium (SPIRIVA) 18 MCG inhalation capsule Place 1 capsule (18 mcg total) into inhaler and inhale daily. (Patient not taking: Reported on 10/29/2017) 30 capsule 6   No facility-administered medications prior to visit.     ROS Review of Systems  Constitutional: Negative for activity change and appetite change.  HENT: Negative for sinus pressure and sore throat.   Eyes: Negative for visual disturbance.  Respiratory: Negative for cough, chest tightness and shortness of breath.   Cardiovascular: Negative for chest pain and leg swelling.  Gastrointestinal: Negative for abdominal distention, abdominal pain, constipation and diarrhea.  Endocrine: Negative.   Genitourinary: Negative for dysuria.  Musculoskeletal: Negative for joint swelling and myalgias.  Skin: Negative for rash.  Allergic/Immunologic: Negative.   Neurological: Negative for weakness, light-headedness and numbness.  Psychiatric/Behavioral: Negative for dysphoric mood and suicidal ideas.    Objective:  BP (!) 172/91   Pulse 97   Temp 98.1 F (36.7 C) (Oral)   Ht 5\' 9"  (1.753 m)   Wt 142 lb 9.6 oz (64.7 kg)   SpO2 96%   BMI 21.06 kg/m   BP/Weight  11/26/2017 04/27/3824 0/01/3975  Systolic BP 734 193 -  Diastolic BP 91 72 -  Wt. (Lbs) 142.6 - 126  BMI 21.06 - 18.61  Some encounter information is confidential and restricted. Go to Review Flowsheets activity to see all data.      Physical Exam  Constitutional: He is oriented to person, place, and time. He appears well-developed and well-nourished.  Cardiovascular: Normal rate, normal heart sounds and intact distal pulses.  No murmur heard. Pulmonary/Chest: Effort normal and breath sounds normal. He has no wheezes. He has no rales. He exhibits no tenderness.  Abdominal: Soft. Bowel sounds are normal. He exhibits no distension and no mass. There is no tenderness.  Musculoskeletal: Normal range of motion.  Neurological: He is alert and oriented to person, place, and time. Coordination (tardive dyskinesia) abnormal.  Skin: Skin is warm and dry.  Psychiatric: He has a normal mood and affect.     Assessment & Plan:   1. Protein C deficiency ([place]) Will need Lifelong anticoagulation -  apixaban (ELIQUIS) 5 MG TABS tablet; Take 1 tablet (5 mg total) by mouth 2 (two) times daily.  Dispense: 60 tablet; Refill: 3  2. History of DVT (deep vein thrombosis) Currently on 2.5mg  of Eliquis (low dose which was probably due to his low weight and creatinine clearance at the time of initiation) I have raised it to 5mg   - apixaban (ELIQUIS) 5 MG TABS tablet; Take 1 tablet (5 mg total) by mouth 2 (two) times daily.  Dispense: 60 tablet; Refill: 3  3. Essential hypertension Uncontrolled- did not take all antihypertensives today Compliance emphasized Counseled on blood pressure goal of less than 130/80, low-sodium, DASH diet, medication compliance, 150 minutes of moderate intensity exercise per week. Discussed medication compliance, adverse effects. - metoprolol tartrate (LOPRESSOR) 25 MG tablet; Take 1 tablet (25 mg total) by mouth 2 (two) times daily.  Dispense: 180 tablet; Refill: 1 - lisinopril  (PRINIVIL,ZESTRIL) 10 MG tablet; Take 1 tablet (10 mg total) by mouth daily.  Dispense: 90 tablet; Refill: 1  4. Paranoid schizophrenia, chronic condition (HCC) Continue Haldol, Remeron, Trazodone As per mental health  5. HIV Last CD4 count was 290 Continue antiretroviral medications Will need to discuss ED meds, safe sexual practices with iD as I am concerned exacerbation of his underlying Psych condition could skew his judgement with regards to safe sexual practice Meds ordered this encounter  Medications  . apixaban (ELIQUIS) 5 MG TABS tablet    Sig: Take 1 tablet (5 mg total) by mouth 2 (two) times daily.    Dispense:  60 tablet    Refill:  3    Discontinue previous dose  . tiotropium (SPIRIVA) 18 MCG inhalation capsule    Sig: Place 1 capsule (18 mcg total) into inhaler and inhale daily.    Dispense:  30 capsule    Refill:  6  . metoprolol tartrate (LOPRESSOR) 25 MG tablet    Sig: Take 1 tablet (25 mg total) by mouth 2 (two) times daily.    Dispense:  180 tablet    Refill:  1  . lisinopril (PRINIVIL,ZESTRIL) 10 MG tablet    Sig: Take 1 tablet (10 mg total) by mouth daily.    Dispense:  90 tablet    Refill:  1    Follow-up: Return in about 3 months (around 02/26/2018) for follow up of chronic medical conditions.   Charlott Rakes MD

## 2017-11-27 ENCOUNTER — Telehealth: Payer: Self-pay | Admitting: *Deleted

## 2017-11-27 NOTE — Telephone Encounter (Signed)
RN contacted [facility] to question the patient's copay cost. [facility] attempted to run but the patient does have a copay cost of 155.58 that must be paid before that can ship the medications out.  The pharmacy will send [place] a bill for the 155.58 and will mail the medications to the patient ASAP.  Attempted to call the patient to make him aware and then remembered that he cannot cover his [place] rent so he may be evicted before the medications can arrive.  I called the [facility] pharmacy back and asked them to please ship the medications to our office. [facility] address given and pharmacist stated Mr Birchall has been so nice to them and they want to be sure he has his ELiquis as well so they are sending that medications for free along with his Genvoya.

## 2017-11-28 ENCOUNTER — Ambulatory Visit: Payer: Self-pay | Admitting: *Deleted

## 2017-11-28 DIAGNOSIS — F1020 Alcohol dependence, uncomplicated: Secondary | ICD-10-CM

## 2017-11-28 DIAGNOSIS — Z8659 Personal history of other mental and behavioral disorders: Secondary | ICD-10-CM

## 2017-11-28 DIAGNOSIS — B20 Human immunodeficiency virus [HIV] disease: Secondary | ICD-10-CM

## 2017-11-28 DIAGNOSIS — F1424 Cocaine dependence with cocaine-induced mood disorder: Secondary | ICD-10-CM

## 2017-11-30 ENCOUNTER — Ambulatory Visit: Payer: Medicare Other | Admitting: Infectious Diseases

## 2017-11-30 IMAGING — CT CT ANGIO CHEST
2 of 6 series · 18 of 36 positions shown · IV contrast (Omni 300)
Comparison: CTA chest 11/02/2016

CLINICAL DATA: Chest pain and tightness. Recent pulmonary embolism.

EXAM:
CT ANGIOGRAPHY CHEST WITH CONTRAST
TECHNIQUE: Multidetector CT imaging of the chest was performed using the
standard protocol during bolus administration of intravenous
contrast. Multiplanar CT image reconstructions and MIPs were
obtained to evaluate the vascular anatomy.
CONTRAST:  100 mL Isovue 370 IV

[Series 6: pe thins · axial · 0.60mm/px · z∈[+1043,+1312]mm · 17 of 303 slices shown]
[im 17/303  lung]
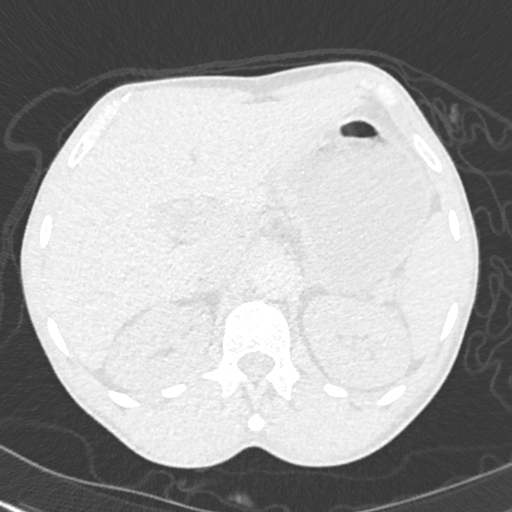
[im 34/303  mediastinal]
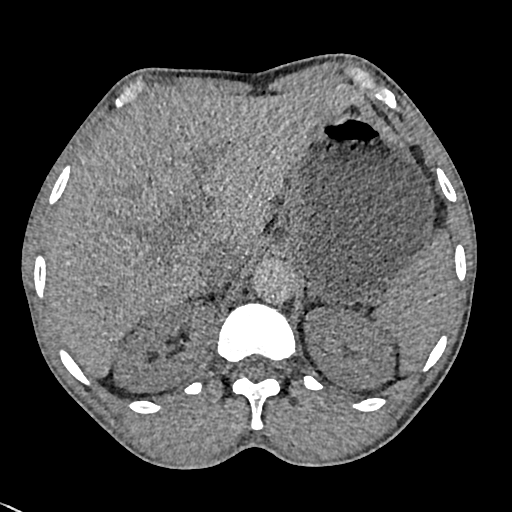
[im 51/303  lung]
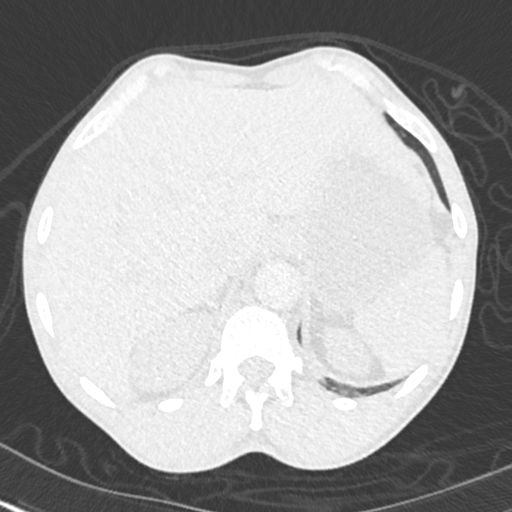
[im 68/303  mediastinal]
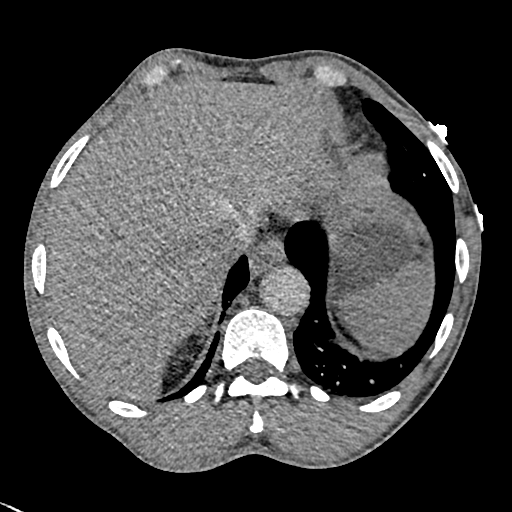
[im 84/303  lung]
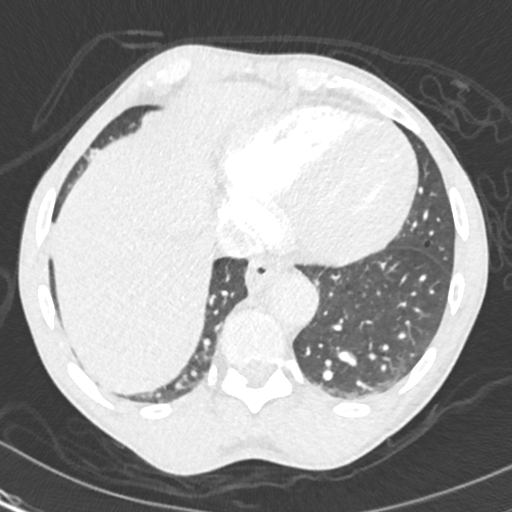
[im 101/303  mediastinal]
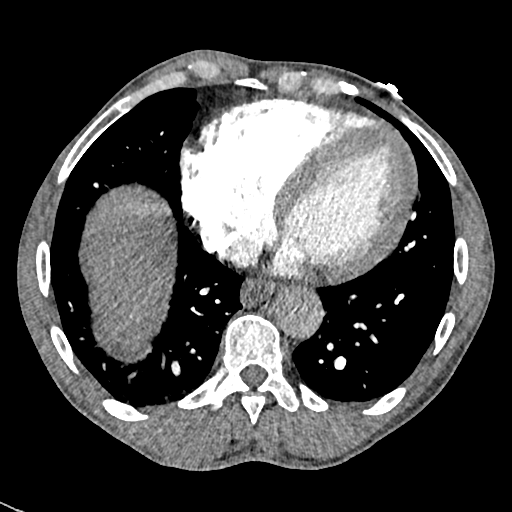
[im 118/303  lung]
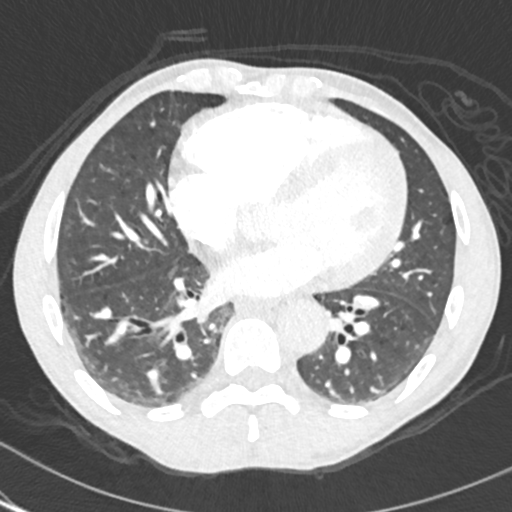
[im 135/303  mediastinal]
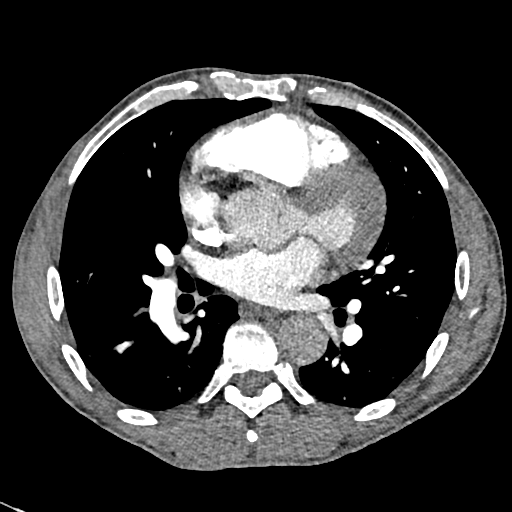
[im 152/303  lung]
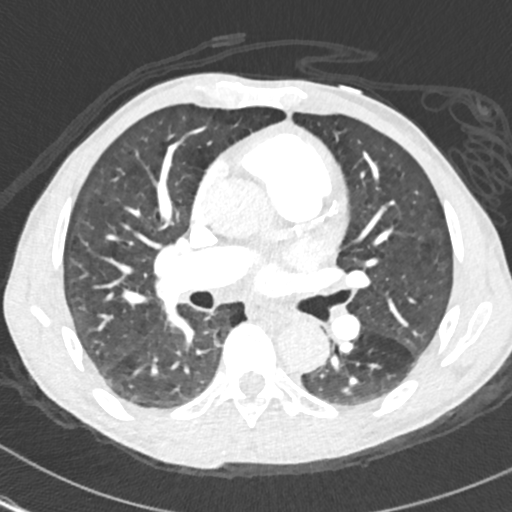
[im 168/303  mediastinal]
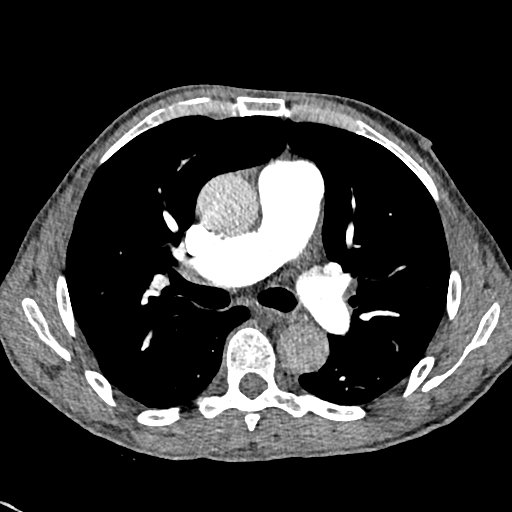
[im 185/303  lung]
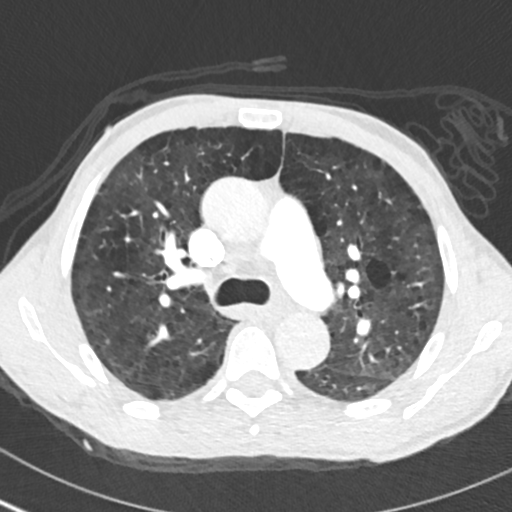
[im 202/303  mediastinal]
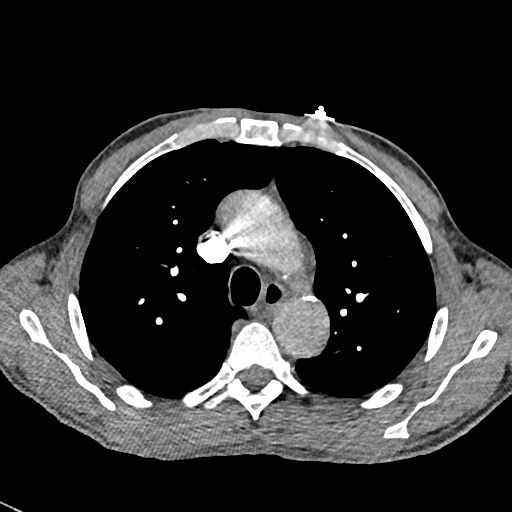
[im 219/303  lung]
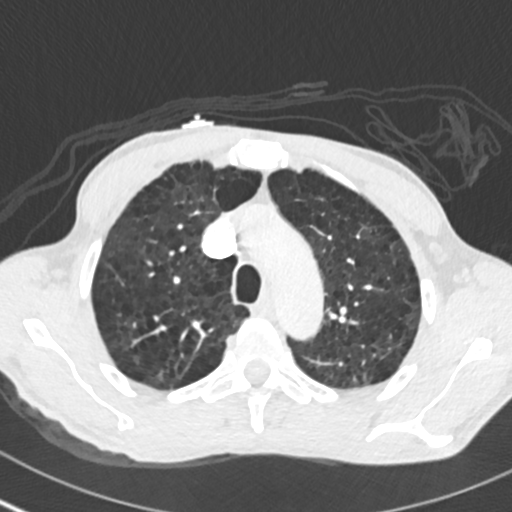
[im 235/303  mediastinal]
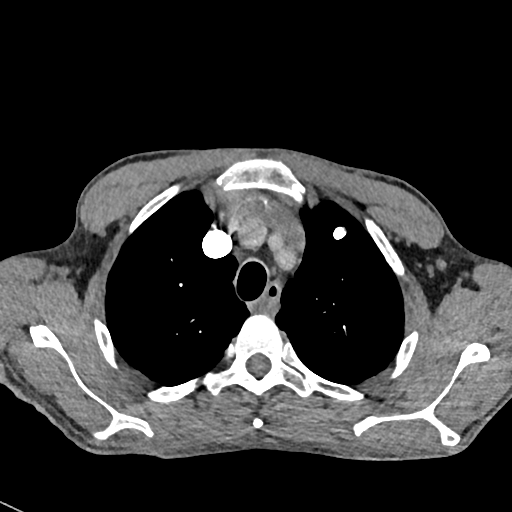
[im 252/303  lung]
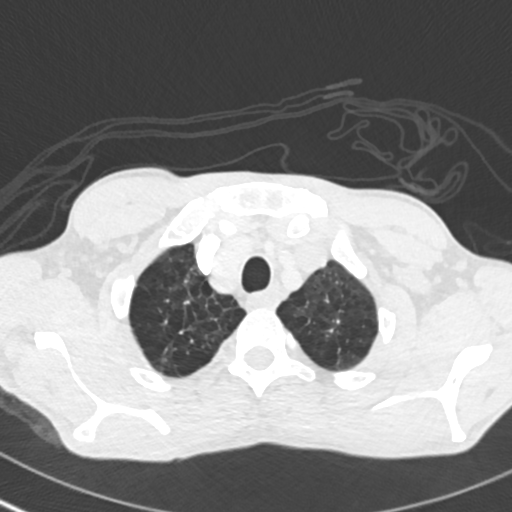
[im 269/303  mediastinal]
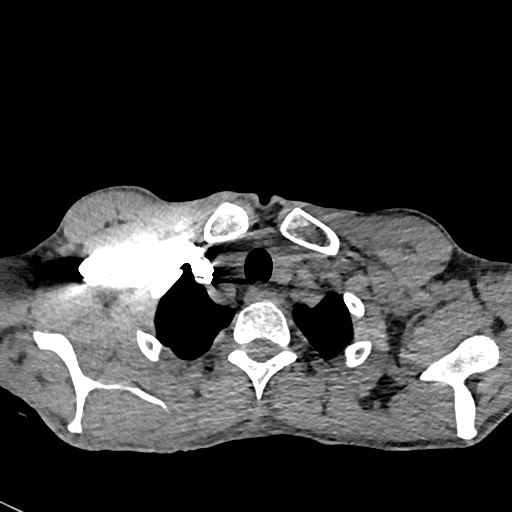
[im 286/303  lung]
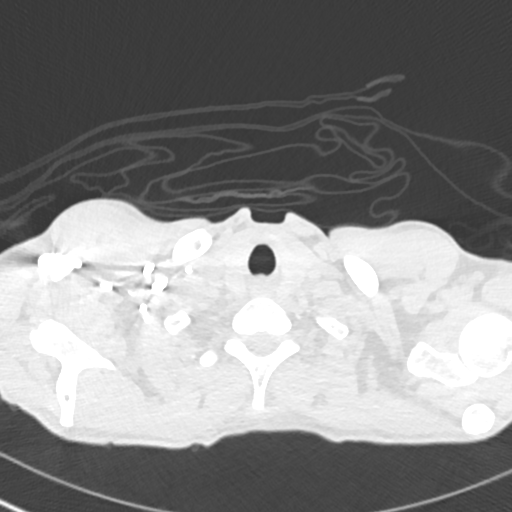

[Series 7: pe 2mm cor · coronal · 0.59mm/px · 1 of 125 slices shown]
[im 63/125  mediastinal]
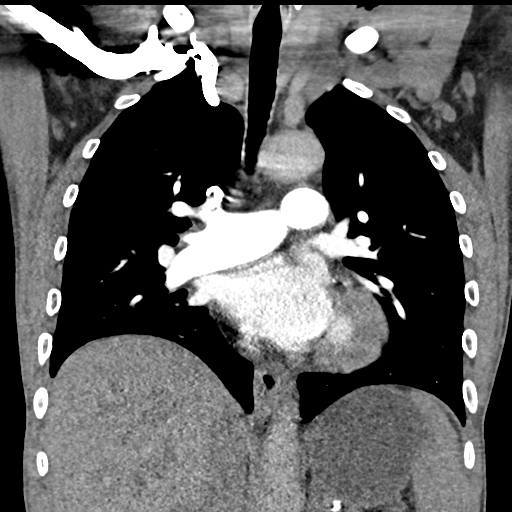

[18 of 36 positions shown; findings below may reference images not displayed]

FINDINGS: Cardiovascular: Contrast injection is sufficient to demonstrate
satisfactory opacification of the pulmonary arteries to the
segmental level. The previously seen extensive bilateral pulmonary
emboli have largely resolved. There is a small amount of persistent
embolus along the medial wall of the right lower lobe posterior
basal segmental artery. On the left, there is a small amount of
residual embolus within the left lower lobe anterior and medial
basal segmental arteries. The main pulmonary artery is mildly
enlarged, measuring 3.1 cm at the bifurcation.. There is no CT
evidence of acute right heart strain. There is mild aortic
atherosclerotic calcification. There is a normal 3-vessel arch
branching pattern. Heart size is mildly enlarged, without
pericardial effusion. There are coronary artery calcifications.

Mediastinum/Nodes: No mediastinal, hilar or axillary
lymphadenopathy. The visualized thyroid and thoracic esophageal
course are unremarkable.

Lungs/Pleura: Bilateral severe emphysema. No pleural effusion or
pneumothorax. No pulmonary nodules or masses.

Upper Abdomen: Contrast bolus timing is not optimized for evaluation
of the abdominal organs. Within this limitation, the visualized
organs of the upper abdomen are normal.

Musculoskeletal: No chest wall abnormality. No acute or significant
osseous findings.

Review of the MIP images confirms the above findings.
IMPRESSION: 1. Markedly decreased pulmonary embolic burden. Small amount of
residual embolus within bilateral basal segmental arteries.
2. Mildly enlarged main pulmonary artery, as may be seen in setting
of pulmonary hypertension.
3. Severe emphysema.

## 2017-12-17 NOTE — Progress Notes (Signed)
[facility] pharmacy delivered [name] Herring' medications with the copay of 151 paid. I came to the clinic and gave 52.00 from my fund back to [facility] medication assistance fund. Traveled to the patient's home with the medications (Genvoya and Eliquis) and after knocking on the door for a while [name] Herring opened the door, undressed and appeared under the influence.  Due to safety concerns the front apartment door was left open and after I explained to [name] Herring that I have his genvoya and Eliquis I left the home.

## 2017-12-17 NOTE — Progress Notes (Signed)
Conferenced with available community resources for funding to cover the copay cost of [name] Herring medications. We have 3 different funding sources and together we can pay for [name] Herring medications. Spoke with [facility] pharmacy and they have agreed to deliver medications to [facility] if we will pay the 151.00 copay. The pharmacy also agreed to give [name] Herring his blood thinner as well at no charge.

## 2018-01-09 ENCOUNTER — Telehealth: Payer: Self-pay | Admitting: *Deleted

## 2018-01-09 NOTE — Telephone Encounter (Signed)
Contacted the patient and have been unable to reach him by phone. I will attempt another call tomorrow with a possible drive by to his home.

## 2018-01-10 ENCOUNTER — Ambulatory Visit: Payer: Self-pay | Admitting: *Deleted

## 2018-01-10 DIAGNOSIS — B20 Human immunodeficiency virus [HIV] disease: Secondary | ICD-10-CM

## 2018-01-10 DIAGNOSIS — F1424 Cocaine dependence with cocaine-induced mood disorder: Secondary | ICD-10-CM

## 2018-01-10 DIAGNOSIS — F1020 Alcohol dependence, uncomplicated: Secondary | ICD-10-CM

## 2018-01-14 ENCOUNTER — Telehealth: Payer: Self-pay | Admitting: *Deleted

## 2018-01-14 NOTE — Telephone Encounter (Signed)
Reassessment visit made on 01/10/18 and at this time the patient still requires assistance with medications adherence and staying in care with his Health Care Team.  Initial contact with attempted on 06/13/17 with 1st home visit on 06/20/17 after Discharge from the hospitalPatient was consented to care at this time.   Initial Assessment Points to Consider for Care             Points are not all inclusive to services and educated provided but supports the patient's Individualized Plan of Care.   Is Home safe for visits? Yes / Are all firearms or weapons secure? yes  Insurance Coverage: Medicare  1st HIV Diagnosis: Nov 1999  Mode of HIV Transmission: MSM  Functional Status: able to perform ADL's independently with additional time given  Current Housing/Needs: no housing needs at this time, lives in an apartment alone   Social Support/System: Dtr who lives in [place] in [place]) no longer sister(Barbara)  Culture/Religion/Spirituality:none noted that would create barriers to care  Educational Background:   Legal Issues: none noted  Access/Utilization of Community Resources:27% no show rate  Mental Health Concerns/Diagnosis: Schizoaffective disorder  Alcohol and/or Drug Use: Hx of cocaine abuse, current alcohol abuse  Risk and Knowledge of HIV and Reduction in Transmission:  Very knowledgeable able mode of transmission and ways to reduce transmission Nutritional Needs: not at this time    Frequency / Duration of [place] visits: Effective 01/17/18  28mo1, 43mo2, 82mo1  4PRN's for complications with disease process/progression, medication changes or concerns   CBHCN will assess for learning needs related to diagnosis and treatment regimen, provide education as needed, fill pill box if needed, address any barriers which may be preventing medication compliance, and communicating with care team including physician and case manager.   Individualized Plan Of Care with  Certification Period from 01/17/18  to 04/17/18             a. Type of service(s) and care to be delivered: RN Case Management             b. Frequency and duration of service: Effective 01/17/18  74mo1, 80mo2, 37mo1, 4 prns for complications with disease process/progression, medication changes or concerns . Visits/Contact may be conducted   telephonically or in person to best suit the patient.             c. Activity restrictions: Pt may be up as tolerated and can safely ambulate without the need for a assistive device. Additional time does need to be given to complete task              d. Safety Measures: Standard Precautions/Infection Control              e. Service Objectives and Goals: Service Objectives are to assist the pt with HIV medication regimen adherence and staying in care with the Infectious            Disease Clinic by identifying barriers to care. RN will address the     barriers that are identified by the patient. Current the patient is capable to taking his own medications but states he would like help and reminders with his care. Patient states he is struggling with alcoholism and thoughts of abusing drugs and would like to have a support person to engage with him. Declines rehab at this time               f. Equipment required: No additional equipment needs at this time  g. Functional Limitations: Vision. Pt has corrective glasses that he wears              h. Rehabilitation potential: Guarded              i. Diet and Nutritional Needs: Regular Diet              j. Medications and treatments: Medications have been reconciled and reviewed and are a part of EPIC electronic file              k. Specific therapies if needed: RN              l. Pertinent diagnoses: HIV disease,  Hx of medication NonCompliance, Schizoaffective disorder             m. Expected outcome: Guarded

## 2018-01-15 NOTE — Telephone Encounter (Signed)
Received a call from Harvin Hazel stating triage received a message from the pharmacy concerned about Mr Cragle' insurance coverage.  [facility] pharmacy and spoke with the pharmacist who stated the patient's [facility] coverage/[employer] is no longer active and MCD is sending back a message when the pharmacy tries to authorize the refill of the patient's medications. Currently the patient does not have coverage for his medications so currently we are working on finding a solution to getting his medications

## 2018-01-21 NOTE — Telephone Encounter (Signed)
I have approved this plan of care.

## 2018-01-25 NOTE — Progress Notes (Signed)
I have been unable to reach Mr. [name] Herring by phone so a home visit was made today. Mr. [name] Herring appears under the influence of a substance so visit was cut short. Mr [name] Herring did state that he needs help with getting his medications again. I f/u with the pharmacy about Mr [name] Herring' medications.

## 2018-01-31 ENCOUNTER — Encounter (HOSPITAL_COMMUNITY): Payer: Self-pay | Admitting: Emergency Medicine

## 2018-01-31 ENCOUNTER — Ambulatory Visit: Payer: Self-pay | Admitting: Infectious Diseases

## 2018-01-31 ENCOUNTER — Emergency Department (HOSPITAL_COMMUNITY): Payer: Medicare Other

## 2018-01-31 ENCOUNTER — Emergency Department (HOSPITAL_COMMUNITY)
Admission: EM | Admit: 2018-01-31 | Discharge: 2018-01-31 | Disposition: A | Discharge status: Home / Self Care | Payer: Medicare Other | Attending: Emergency Medicine | Admitting: Emergency Medicine

## 2018-01-31 DIAGNOSIS — F419 Anxiety disorder, unspecified: Secondary | ICD-10-CM | POA: Diagnosis not present

## 2018-01-31 DIAGNOSIS — Z5321 Procedure and treatment not carried out due to patient leaving prior to being seen by health care provider: Secondary | ICD-10-CM | POA: Diagnosis not present

## 2018-01-31 DIAGNOSIS — R05 Cough: Secondary | ICD-10-CM | POA: Diagnosis not present

## 2018-01-31 DIAGNOSIS — R079 Chest pain, unspecified: Secondary | ICD-10-CM | POA: Diagnosis not present

## 2018-01-31 DIAGNOSIS — R1030 Lower abdominal pain, unspecified: Secondary | ICD-10-CM | POA: Diagnosis not present

## 2018-01-31 NOTE — ED Notes (Signed)
Patient ambulatory to nurses station reports "I need to leave." Ambulatory without difficulty, speaking in full sentences in NAD.

## 2018-01-31 NOTE — ED Notes (Signed)
Saw pt getting in a blue bird cab leaving the facility.

## 2018-01-31 NOTE — ED Triage Notes (Signed)
Patient here from home with complaints of flu like symptoms, cough, generalized body aches.

## 2018-02-05 ENCOUNTER — Ambulatory Visit: Payer: Self-pay

## 2018-02-20 ENCOUNTER — Other Ambulatory Visit: Payer: Self-pay | Admitting: *Deleted

## 2018-02-20 ENCOUNTER — Ambulatory Visit: Payer: Medicare Other

## 2018-02-20 DIAGNOSIS — B20 Human immunodeficiency virus [HIV] disease: Secondary | ICD-10-CM

## 2018-02-20 MED ORDER — ELVITEG-COBIC-EMTRICIT-TENOFAF 150-150-200-10 MG PO TABS: 1.0000 | ORAL_TABLET | Freq: Every day | ORAL | 6 refills | Status: DC

## 2018-02-21 ENCOUNTER — Other Ambulatory Visit: Payer: Self-pay | Admitting: *Deleted

## 2018-02-22 ENCOUNTER — Ambulatory Visit: Payer: Self-pay | Admitting: *Deleted

## 2018-02-22 VITALS — BP 128/72 | HR 64

## 2018-02-22 DIAGNOSIS — B20 Human immunodeficiency virus [HIV] disease: Secondary | ICD-10-CM

## 2018-02-26 ENCOUNTER — Encounter: Payer: Self-pay | Admitting: Family Medicine

## 2018-02-26 ENCOUNTER — Ambulatory Visit: Payer: Medicare Other | Attending: Family Medicine | Admitting: Family Medicine

## 2018-02-26 ENCOUNTER — Telehealth: Payer: Self-pay | Admitting: *Deleted

## 2018-02-26 VITALS — BP 131/67 | HR 88 | Temp 97.3°F | Ht 69.0 in | Wt 129.2 lb

## 2018-02-26 DIAGNOSIS — Z86718 Personal history of other venous thrombosis and embolism: Secondary | ICD-10-CM | POA: Diagnosis not present

## 2018-02-26 DIAGNOSIS — J438 Other emphysema: Secondary | ICD-10-CM | POA: Diagnosis not present

## 2018-02-26 DIAGNOSIS — Z8711 Personal history of peptic ulcer disease: Secondary | ICD-10-CM | POA: Insufficient documentation

## 2018-02-26 DIAGNOSIS — G4489 Other headache syndrome: Secondary | ICD-10-CM

## 2018-02-26 DIAGNOSIS — Z86711 Personal history of pulmonary embolism: Secondary | ICD-10-CM | POA: Insufficient documentation

## 2018-02-26 DIAGNOSIS — Z21 Asymptomatic human immunodeficiency virus [HIV] infection status: Secondary | ICD-10-CM | POA: Diagnosis not present

## 2018-02-26 DIAGNOSIS — D6859 Other primary thrombophilia: Secondary | ICD-10-CM | POA: Diagnosis not present

## 2018-02-26 DIAGNOSIS — F200 Paranoid schizophrenia: Secondary | ICD-10-CM

## 2018-02-26 DIAGNOSIS — Z880 Allergy status to penicillin: Secondary | ICD-10-CM | POA: Insufficient documentation

## 2018-02-26 DIAGNOSIS — I10 Essential (primary) hypertension: Secondary | ICD-10-CM | POA: Insufficient documentation

## 2018-02-26 DIAGNOSIS — Z7901 Long term (current) use of anticoagulants: Secondary | ICD-10-CM | POA: Insufficient documentation

## 2018-02-26 DIAGNOSIS — Z888 Allergy status to other drugs, medicaments and biological substances status: Secondary | ICD-10-CM | POA: Insufficient documentation

## 2018-02-26 DIAGNOSIS — Z79899 Other long term (current) drug therapy: Secondary | ICD-10-CM | POA: Insufficient documentation

## 2018-02-26 MED ORDER — METOPROLOL TARTRATE 25 MG PO TABS: 25.0000 mg | ORAL_TABLET | Freq: Two times a day (BID) | ORAL | 1 refills | Status: DC

## 2018-02-26 MED ORDER — LISINOPRIL 10 MG PO TABS: 10.0000 mg | ORAL_TABLET | Freq: Every day | ORAL | 1 refills | Status: DC

## 2018-02-26 MED ORDER — APIXABAN 5 MG PO TABS: 5.0000 mg | ORAL_TABLET | Freq: Two times a day (BID) | ORAL | 1 refills | Status: DC

## 2018-02-26 MED ORDER — BUTALBITAL-APAP-CAFFEINE 50-325-40 MG PO TABS: 1.0000 | ORAL_TABLET | Freq: Two times a day (BID) | ORAL | 0 refills | Status: DC | PRN

## 2018-02-26 MED ORDER — TIOTROPIUM BROMIDE MONOHYDRATE 18 MCG IN CAPS: 18.0000 ug | ORAL_CAPSULE | Freq: Every day | RESPIRATORY_TRACT | 1 refills | Status: DC

## 2018-02-26 MED ORDER — CETIRIZINE HCL 10 MG PO TABS: 10.0000 mg | ORAL_TABLET | Freq: Every day | ORAL | 1 refills | Status: DC

## 2018-02-26 NOTE — Progress Notes (Signed)
Subjective:  Patient ID: [name] Herring, male    DOB: 03/11/1961  Age: 57 y.o. MRN: [id number]  CC: Hypertension   HPI [name] Herring  Is a 57 year old male with a history of hypertension, paranoid schizophrenia, HIV (CD4 count of 290 currently on antiretroviral therapy), bilateral submassive pulmonary embolism, acute DVT of branch of the R Gastrocnemius vein, left lower extremity acute mobile thrombus of the proximal popliteal vein,acute DVT of the  distal to mid posterior tibial vein with right heart strain, placement of IVC filter in 10/2016, protein C deficiency, GI bleed secondary to duodenal ulcer here for follow-up visit. He has been out of his Eliquis for the last 1 week due to Medicaid not paying for his medications he states but informs me he has a new insurance now-he now has well care. He complains of frontal headaches for the last 6 months which are not relieved by taking Tylenol and have been intermittent.  Denies nausea, vomiting or photophobia.  He is not sure if he has had a previous history of migraines. He has been compliant with his antipsychotics which he receives from the ACT team with [employer] and he denies suicidal ideations or intents. He has also been compliant with his antiretroviral medications with an upcoming appointment with [facility] in 04/2018. Emphysema has been controlled and he denies shortness of breath or chest pains.  Past Medical History:  Diagnosis Date  . Depression   . HIV (human immunodeficiency virus infection) ([place])   . Hypertension   . Immune deficiency disorder ([place])   . PE (pulmonary thromboembolism) ([place]) 2018  . Personality disorder ([place])   . Schizophrenia [facility])     Past Surgical History:  Procedure Laterality Date  . COLONOSCOPY WITH PROPOFOL N/A 06/15/2017   Procedure: COLONOSCOPY WITH PROPOFOL;  Surgeon: Irene Shipper, MD;  Location: [facility] END[facility];  Service: Endoscopy;  Laterality: N/A;  . ESOPHAGOGASTRODUODENOSCOPY (EGD) WITH  PROPOFOL N/A 06/15/2017   Procedure: ESOPHAGOGASTRODUODENOSCOPY (EGD) WITH PROPOFOL;  Surgeon: Irene Shipper, MD;  Location: [facility];  Service: Endoscopy;  Laterality: N/A;  . IR GENERIC HISTORICAL  11/04/2016   IR IVC FILTER PLMT / S&I Burke Keels GUID/MOD SED 11/04/2016 Aletta Edouard, MD [facility]-INTERV RAD    Allergies  Allergen Reactions  . Amoxicillin Shortness Of Breath and Rash    Has patient had a PCN reaction causing immediate rash, facial/tongue/throat swelling, [name] Has patient had a PCN reaction causing severe rash involving mucus membranes or skin necrosis: no Has patient had a PCN reaction that required hospitalization: yes drs office visit Has patient had a PCN reaction occurring within the last 10 years: no If all of the above answers are "NO", then may proceed with Cephalosporin use.   . Latex Shortness Of Breath  . Abilify [Aripiprazole] Other (See Comments)    Double vision     Outpatient Medications Prior to Visit  Medication Sig Dispense Refill  . acetaminophen (TYLENOL) 500 MG tablet Take 1 tablet (500 mg total) by mouth every 6 (six) hours as needed for moderate pain. 30 tablet 0  . benztropine (COGENTIN) 1 MG tablet 1 tab in the morning 2 tabs at night (Patient taking differently: Take 1-2 mg by mouth See admin instructions. Take 1 tablet in the morning and take 2 tablets at night) 14 tablet 0  . elvitegravir-cobicistat-emtricitabine-tenofovir (GENVOYA) 150-150-200-10 MG TABS tablet Take 1 tablet by mouth daily with breakfast. 30 tablet 6  . gabapentin (NEURONTIN) 600 MG tablet  Take 600 mg by mouth at bedtime.    . haloperidol (HALDOL) 10 MG tablet Take 0.5 tablets (5 mg total) by mouth 2 (two) times daily.    . magnesium oxide (MAG-OX) 400 (241.3 Mg) MG tablet Take 1 tablet (400 mg total) by mouth 2 (two) times daily. 60 tablet 0  . mirtazapine (REMERON) 30 MG tablet Take 1 tablet (30 mg total) by mouth at bedtime. 30 tablet 3  .  Multiple Vitamin (MULTIVITAMIN WITH MINERALS) TABS tablet Take 1 tablet by mouth daily.    . pantoprazole (PROTONIX) 40 MG tablet Take 1 tablet (40 mg total) by mouth 2 (two) times daily before a meal. 60 tablet 1  . polyethylene glycol (MIRALAX / GLYCOLAX) packet Take 17 g by mouth daily.    Marland Kitchen sulfamethoxazole-trimethoprim (BACTRIM DS) 800-160 MG tablet Take 1 tablet by mouth daily. 30 tablet 1  . traZODone (DESYREL) 50 MG tablet Take 1 tablet (50 mg total) by mouth at bedtime. 30 tablet 3  . Valbenazine Tosylate (INGREZZA) 80 MG CAPS Take 80 mg by mouth 2 (two) times daily.     Marland Kitchen apixaban (ELIQUIS) 5 MG TABS tablet Take 1 tablet (5 mg total) by mouth 2 (two) times daily. 60 tablet 3  . lisinopril (PRINIVIL,ZESTRIL) 10 MG tablet Take 1 tablet (10 mg total) by mouth daily. 90 tablet 1  . metoprolol tartrate (LOPRESSOR) 25 MG tablet Take 1 tablet (25 mg total) by mouth 2 (two) times daily. 180 tablet 1  . tiotropium (SPIRIVA) 18 MCG inhalation capsule Place 1 capsule (18 mcg total) into inhaler and inhale daily. 30 capsule 6  . promethazine-dextromethorphan (PROMETHAZINE-DM) 6.25-15 MG/5ML syrup Take 5 mLs by mouth 4 (four) times daily as needed for cough. (Patient not taking: Reported on 11/26/2017) 150 mL 0   No facility-administered medications prior to visit.     ROS Review of Systems  Constitutional: Negative for activity change and appetite change.  HENT: Negative for sinus pressure and sore throat.   Eyes: Negative for visual disturbance.  Respiratory: Negative for cough, chest tightness and shortness of breath.   Cardiovascular: Negative for chest pain and leg swelling.  Gastrointestinal: Negative for abdominal distention, abdominal pain, constipation and diarrhea.  Endocrine: Negative.   Genitourinary: Negative for dysuria.  Musculoskeletal: Negative for joint swelling and myalgias.  Skin: Negative for rash.  Allergic/Immunologic: Negative.   Neurological: Positive for headaches.  Negative for weakness, light-headedness and numbness.  Psychiatric/Behavioral: Negative for dysphoric mood and suicidal ideas.    Objective:  BP 131/67   Pulse 88   Temp (!) 97.3 F (36.3 C) (Oral)   Ht 5\' 9"  (1.753 m)   Wt 129 lb 3.2 oz (58.6 kg)   SpO2 97%   BMI 19.08 kg/m   BP/Weight 02/26/2018 02/27/7845 06/01/2951  Systolic BP 841 324 401  Diastolic BP 67 66 91  Wt. (Lbs) 129.2 - 142.6  BMI 19.08 - 21.06  Some encounter information is confidential and restricted. Go to Review Flowsheets activity to see all data.      Physical Exam  Constitutional: He is oriented to person, place, and time. He appears well-developed and well-nourished.  Cardiovascular: Normal rate, normal heart sounds and intact distal pulses.  No murmur heard. Pulmonary/Chest: Effort normal and breath sounds normal. He has no wheezes. He has no rales. He exhibits no tenderness.  Abdominal: Soft. Bowel sounds are normal. He exhibits no distension and no mass. There is no tenderness.  Musculoskeletal: Normal range of motion.  Neurological:  He is alert and oriented to person, place, and time.  Tardive dyskinesia  Skin: Skin is warm and dry.  Psychiatric: He has a normal mood and affect.     Assessment & Plan:   1. Essential hypertension Controlled Low-sodium diet - metoprolol tartrate (LOPRESSOR) 25 MG tablet; Take 1 tablet (25 mg total) by mouth 2 (two) times daily.  Dispense: 180 tablet; Refill: 1 - lisinopril (PRINIVIL,ZESTRIL) 10 MG tablet; Take 1 tablet (10 mg total) by mouth daily.  Dispense: 90 tablet; Refill: 1  2. Other headache syndrome Likely sinus related - butalbital-acetaminophen-caffeine (ESGIC) 50-325-40 MG tablet; Take 1 tablet by mouth 2 (two) times daily as needed for headache.  Dispense: 30 tablet; Refill: 0 - cetirizine (ZYRTEC) 10 MG tablet; Take 1 tablet (10 mg total) by mouth daily.  Dispense: 30 tablet; Refill: 1  3. Protein C deficiency ([place]) Will be on lifelong  anticoagulation - apixaban (ELIQUIS) 5 MG TABS tablet; Take 1 tablet (5 mg total) by mouth 2 (two) times daily.  Dispense: 180 tablet; Refill: 1  4. History of DVT (deep vein thrombosis) Will need to be on lifelong anticoagulation - apixaban (ELIQUIS) 5 MG TABS tablet; Take 1 tablet (5 mg total) by mouth 2 (two) times daily.  Dispense: 180 tablet; Refill: 1  5. Paranoid schizophrenia, chronic condition ([place]) Currently followed by the ACT team and receives his medications from [employer]  6. Other emphysema ([place]) Controlled on Spiriva - tiotropium (SPIRIVA) 18 MCG inhalation capsule; Place 1 capsule (18 mcg total) into inhaler and inhale daily.  Dispense: 90 capsule; Refill: 1   Meds ordered this encounter  Medications  . butalbital-acetaminophen-caffeine (ESGIC) 50-325-40 MG tablet    Sig: Take 1 tablet by mouth 2 (two) times daily as needed for headache.    Dispense:  30 tablet    Refill:  0  . tiotropium (SPIRIVA) 18 MCG inhalation capsule    Sig: Place 1 capsule (18 mcg total) into inhaler and inhale daily.    Dispense:  90 capsule    Refill:  1  . metoprolol tartrate (LOPRESSOR) 25 MG tablet    Sig: Take 1 tablet (25 mg total) by mouth 2 (two) times daily.    Dispense:  180 tablet    Refill:  1  . lisinopril (PRINIVIL,ZESTRIL) 10 MG tablet    Sig: Take 1 tablet (10 mg total) by mouth daily.    Dispense:  90 tablet    Refill:  1  . cetirizine (ZYRTEC) 10 MG tablet    Sig: Take 1 tablet (10 mg total) by mouth daily.    Dispense:  30 tablet    Refill:  1  . apixaban (ELIQUIS) 5 MG TABS tablet    Sig: Take 1 tablet (5 mg total) by mouth 2 (two) times daily.    Dispense:  180 tablet    Refill:  1    Follow-up: Return in about 6 months (around 08/28/2018) for follow up of chronic medical conditions.   Charlott Rakes MD

## 2018-02-26 NOTE — Telephone Encounter (Signed)
Received a call from [facility] just making me aware that they will be sending out [name] Herring' medications with the exception of Genvoya since I have already delivered a 30day supply on 02/22/18

## 2018-02-28 ENCOUNTER — Telehealth: Payer: Self-pay

## 2018-02-28 NOTE — Telephone Encounter (Signed)
Called pt to verify insurance coverage. PA for Jorje Guild is being rejected. Per chart pt seems to be having some insurance issues. Spoke with Juliann Pulse at [employer] to see if pt had ADAP looks like pt is still in the process for ADAP.  Unable to leave a vm for pt due to VM not being set up.  Reached to out [name], RN who works with pt to see if she knew what the status was on [employer].  [place]

## 2018-03-01 ENCOUNTER — Other Ambulatory Visit: Payer: Self-pay

## 2018-03-01 ENCOUNTER — Telehealth: Payer: Self-pay | Admitting: Infectious Diseases

## 2018-03-01 MED ORDER — BICTEGRAVIR-EMTRICITAB-TENOFOV 50-200-25 MG PO TABS: 1.0000 | ORAL_TABLET | Freq: Every day | ORAL | 5 refills | Status: DC

## 2018-03-01 MED ORDER — UMECLIDINIUM BROMIDE 62.5 MCG/INH IN AEPB: 1.0000 | INHALATION_SPRAY | Freq: Every day | RESPIRATORY_TRACT | 2 refills | Status: DC

## 2018-03-01 NOTE — Telephone Encounter (Signed)
Discussed with [name] about patient. There is a DDI between Genvoya and the Eliquis. Will switch him to [place] instead.   He needs an appointment for follow up which [name] will graciously arrange.   Janene Madeira, NP

## 2018-03-04 ENCOUNTER — Telehealth: Payer: Self-pay | Admitting: *Deleted

## 2018-03-04 NOTE — Telephone Encounter (Signed)
With medication interaction I switched him to [place] - I spoke with [name] about this and sent in the order for this already. No further action needed, just FYI.  Thank you.

## 2018-03-06 ENCOUNTER — Telehealth: Payer: Self-pay | Admitting: *Deleted

## 2018-03-06 NOTE — Telephone Encounter (Signed)
Received a call from [facility] stating [name] Herring has 2 medications that need to be filled but the cost is 177.00 dollars.   Returned the call and confirmed that the patient does have insurance coverage and the above balance is a copy cost Biktarvy/159.00 and Eliquis/18.00.  Contacted [name] Herring who stated he has reinstated his MCR part D coverage and was told that Brand name medications will be 8 dollars and generic medications will be 3 dollars.  I know that the patient has already applied for ADAP and contacted [facility] Counselor to see if the application will automatically be converted to SPAP since he has MCR coverage.

## 2018-03-06 NOTE — Telephone Encounter (Signed)
Contacted [facility] services and spoke with [name] who stated the patient does have MCR part D coverage and has been enrolled in the [place]. To my understanding the plan may have a cheaper premium but you pay more for copays.   I informed [name] that the patient was under the impression that his copays will be 8 for brand and 3 for generic. [name] stated the patient may have been offered the Extra Help Program. The Extra Help program does have copays as stated above if his monthly income is less than 1581. The application takes about 4 to 6 weeks to process and he can call in at anytime to apply.  Contacted [name] Herring and he stated he receives 951/month in Social Security benefits. Through a conference call we contacted [place] and he does qualify for the Extra Help Program so we completed the application at this time. During the application process [name] Herring may qualify for an additional program based on his income that will also take away his MCR B premium of 135.50. This will increase his monthly income to 1086.50. [name] Herring agreed to apply for this program which is called the Medicare Savings Program. This program states he cannot make more than 1451/month. The [facility] representative informed [name] Herring that he will be receiving a written copy of the application he submitted electronically within 5 to 7 business days through the mail and the approval or denial should come within 6 to 8 weeks.   The patient has also applied for SPAP so that decision should come back within the next couple of weeks

## 2018-03-06 NOTE — Telephone Encounter (Signed)
Return call from [place] in reference to Wilder Glade' ADAP application. Sharyn Lull stated the ADAP application will convert to SPAP once the application along with is social is ran through the system. The system will recognize that Ms Lafoe' has [facility] and will convert the application to SPAP.

## 2018-03-08 ENCOUNTER — Telehealth: Payer: Self-pay | Admitting: *Deleted

## 2018-03-08 NOTE — Telephone Encounter (Addendum)
Spoke with [facility] and she was able to get Mr. Buchanon' medications covered through [employer].   Contacted [facility] and gave the following information:  Pharmacy Card: [id number] BIN: [id number] PCN: [id number] GRP: [id number]  Alden Hipp and informed him that the pharmacy will be contacting him for his delivery. Informed Abelino that his new medication Phillips Odor will replace his Genvoya. Reiterated that he is to stop his Genvoya once his starts taking the [place] and to not take the two together. Offered to come by his home and pick up the [facility] if he would like.   Follow up visit scheduled with the clinic for 06/26 at 4pm and planned a home visit for next week

## 2018-03-11 NOTE — Progress Notes (Signed)
Home visit made today with a focus on assessing his general health and insurance coverage. On arrival [name] Herring appeared alert and oriented and did not appear under the influence of any substances. His vitals were stable without any c/o pain. He denies any active or prior rectal bleeds and states he has not fallen since his last hospitalization for falls. I have given [name] Herring a 30 day supply of Genvoya as well and we began to discuss his insurance coverage. [name] Herring stated he has contacted [facility] and his coverage is not active so he will begin to receive his medications again. At this visit I also provided [name] Herring with a list of his upcoming appt and he confirmed that he will have transportation for the appt.

## 2018-03-19 ENCOUNTER — Telehealth: Payer: Self-pay | Admitting: Family Medicine

## 2018-03-19 NOTE — Telephone Encounter (Signed)
3 page, paperwork received through fax [phone]. [phone]

## 2018-03-20 ENCOUNTER — Encounter: Payer: Self-pay | Admitting: Infectious Diseases

## 2018-03-20 ENCOUNTER — Ambulatory Visit: Payer: Self-pay | Admitting: Infectious Diseases

## 2018-03-20 ENCOUNTER — Other Ambulatory Visit: Payer: Self-pay | Admitting: Family Medicine

## 2018-03-20 DIAGNOSIS — I10 Essential (primary) hypertension: Secondary | ICD-10-CM

## 2018-03-23 ENCOUNTER — Other Ambulatory Visit: Payer: Self-pay | Admitting: Family Medicine

## 2018-03-23 DIAGNOSIS — I10 Essential (primary) hypertension: Secondary | ICD-10-CM

## 2018-03-25 ENCOUNTER — Other Ambulatory Visit: Payer: Self-pay | Admitting: Family Medicine

## 2018-03-25 DIAGNOSIS — I10 Essential (primary) hypertension: Secondary | ICD-10-CM

## 2018-04-12 ENCOUNTER — Telehealth: Payer: Self-pay | Admitting: *Deleted

## 2018-04-12 NOTE — Telephone Encounter (Signed)
RN contacted the patient today. Purpose of the call is to stay connected with the patient. After calling and being unable to reach him, Mr Capobianco returned my call stating he only has 1 pill remaining.   Contacted [facility] to verify that the patient is scheduled for delivery of his medications. The Pharmacist verified the insurance coverage and stated she believes the Genvoya was sent out on the 12th. Clarified with the pharmacist that the patient should be receiving Biktarvy not Genvoya (med changed on 06/07). Pharmacist confirmed that Phillips Odor will be sent out today. Contacted Mr Coone and made him aware and we planned a home visit for Monday to f/u on his medication management. I would like to be sure he receives his Biktarvy.  Reassessment visit scheduled for 04/15/18 and at this time the patient still requires assistance with medications adherence and staying in care with his Health Care Team.  Initial contact with attempted on 06/13/17 with 1st home visit on 06/20/17 after Discharge from the hospitalPatient was consented to care at this time.   Initial Assessment Points to Consider for Care             Points are not all inclusive to services and educated provided but supports the patient's Individualized Plan of Care.   Is Home safe for visits? Yes / Are all firearms or weapons secure? yes  Insurance Coverage: Medicare  1st HIV Diagnosis: Nov 1999  Mode of HIV Transmission: MSM  Functional Status: able to perform ADL's independently with additional time given  Current Housing/Needs: no housing needs at this time, lives in an apartment alone   Social Support/System: Dtr who lives in [place] in [place]) no longer sister(Barbara)  Culture/Religion/Spirituality:none noted that would create barriers to care  Educational Background:   Legal Issues: none noted  Access/Utilization of Community Resources:27% no show rate  Mental Health Concerns/Diagnosis: Schizoaffective  disorder  Alcohol and/or Drug Use: Hx of cocaine abuse, current alcohol abuse  Risk and Knowledge of HIV and Reduction in Transmission:  Very knowledgeable able mode of transmission and ways to reduce transmission Nutritional Needs: not at this time    Frequency / Duration of [place] visits: Effective 04/18/18  2mo1, 61mo2, 50mo1  4PRN's for complications with disease process/progression, medication changes or concerns   CBHCN will assess for learning needs related to diagnosis and treatment regimen, provide education as needed, fill pill box if needed, address any barriers which may be preventing medication compliance, and communicating with care team including physician and case manager.   Individualized Plan Of Care with Certification Period from 04/18/18  to 07/17/18             a. Type of service(s) and care to be delivered: RN Case Management             b. Frequency and duration of service: Effective 04/18/18  60mo1, 93mo2, 38mo1, 4 prns for complications with disease process/progression, medication changes or concerns . Visits/Contact may be conducted   telephonically or in person to best suit the patient.             c. Activity restrictions: Pt may be up as tolerated and can safely ambulate without the need for a assistive device. Additional time does need to be given to complete task              d. Safety Measures: Standard Precautions/Infection Control              e. Service Objectives and Goals:  Service Objectives are to assist the pt with HIV medication regimen adherence and staying in care with the Infectious            Disease Clinic by identifying barriers to care. RN will address the     barriers that are identified by the patient. Current the patient is capable to taking his own medications but states he would like help and reminders with his care. Patient states he is struggling with alcoholism and thoughts of abusing drugs and would like to have a support person to engage with  him. Declines rehab at this time               f. Equipment required: No additional equipment needs at this time              g. Functional Limitations: Vision. Pt has corrective glasses that he wears              h. Rehabilitation potential: Guarded              i. Diet and Nutritional Needs: Regular Diet              j. Medications and treatments: Medications have been reconciled and reviewed and are a part of EPIC electronic file              k. Specific therapies if needed: RN              l. Pertinent diagnoses: HIV disease,  Hx of medication NonCompliance, Schizoaffective disorder             m. Expected outcome: Guarded

## 2018-04-15 ENCOUNTER — Ambulatory Visit: Payer: Self-pay | Admitting: *Deleted

## 2018-04-15 DIAGNOSIS — B20 Human immunodeficiency virus [HIV] disease: Secondary | ICD-10-CM

## 2018-04-15 DIAGNOSIS — F1020 Alcohol dependence, uncomplicated: Secondary | ICD-10-CM

## 2018-04-16 ENCOUNTER — Ambulatory Visit: Payer: Self-pay | Admitting: *Deleted

## 2018-04-16 DIAGNOSIS — B20 Human immunodeficiency virus [HIV] disease: Secondary | ICD-10-CM

## 2018-04-16 NOTE — Telephone Encounter (Signed)
I will sign my approval of this plan but let Dr. Megan Salon do so as well.

## 2018-04-22 NOTE — Telephone Encounter (Signed)
I approve this plan of care.

## 2018-06-11 NOTE — Progress Notes (Signed)
   CHIEF COMPLAINT:   Chief Complaint  Patient presents with  . Medication Management    Subjective  I have been unable to reach Goswami by phone so a home visit was made today. On arrival to his home you can see roach infestation before the patient opened the door. While knocking on the door and entering the foyer of the apartment, roaches began to crawl on my hands so I kindly exited the home and spoke to Mr Hamon from the door way. He stated he has been feeling well and has all of his medications.      Objective : Concerned expressed to Mr Spraggins in reference to the unbelievable amount of roaches present in the home. Mr Sculley stated the apartment complex has sprayed and I stated this his not healthy for him. After leaving the home I drove to the apartment complex management office and spoke with the office manager about the condition of Gordan' home. The apartment complex stated last Thursday they attempted to spray for roaches but due to the uncleanness of the apartment instructed Mr Davoli to 1st clean his home and they will return to spray the home for roaches. I explained to the apartment that the apartment is now clean and organized but the roach infestation is the worst that I have witnessed. The apartment manager stated she will definitely have the apartment sprayed again and provided me with a "Raid combat gel" I returned to Mr Alen' apartment and provided him with the Raid and explained to please be on the look out for the exterminators on this Thursday Examination:  General exam: Appears calm and comfortable  Respiratory system: not assessed HEENT:not assessed this visit Cardiovascular system:  No pedal edema. Gastrointestinal system: Denies any concerns with constipation or rectal bleeding Central nervous system: Alert and oriented. Appropriately answer questions or concerns asked Extremities: Symmetric. Slightly unsteady gait but quickly able to recover  Skin: No rashes,  lesions or ulcers noted Psychiatry: Judgement and insight appear normal. Mood & affect appropriate.   VITALS:  vitals were not taken for this visit.   I personally reviewed Labs under Results section.Results for WESTIN, KNOTTS (MRN [id number]) as of 06/11/2018 13:44  Ref. Range 10/24/2017 14:51  HIV 1 RNA Quant Latest Ref Range: NOT DETECT copies/mL 26 (H)       Assessment/Plan Please see about documentation  Time spent: 45 minutes not including travel time to and from the home   [place]neil, Durward Fortes

## 2018-06-11 NOTE — Progress Notes (Signed)
Arrived at the home today. Several knocks on a locked door and unable to reach the patient by phone so I left the home. Focus of visit is for a well check of the patient and to assess his medication adherence

## 2018-06-14 ENCOUNTER — Ambulatory Visit: Payer: Self-pay | Admitting: *Deleted

## 2018-06-14 DIAGNOSIS — B20 Human immunodeficiency virus [HIV] disease: Secondary | ICD-10-CM

## 2018-06-14 DIAGNOSIS — R2681 Unsteadiness on feet: Secondary | ICD-10-CM

## 2018-06-14 DIAGNOSIS — F1020 Alcohol dependence, uncomplicated: Secondary | ICD-10-CM

## 2018-06-17 ENCOUNTER — Encounter: Payer: Self-pay | Admitting: Infectious Diseases

## 2018-06-17 ENCOUNTER — Ambulatory Visit (INDEPENDENT_AMBULATORY_CARE_PROVIDER_SITE_OTHER): Payer: Medicare Other | Admitting: Infectious Diseases

## 2018-06-17 VITALS — BP 137/87 | HR 110 | Temp 98.1°F | Ht 69.0 in | Wt 139.0 lb

## 2018-06-17 DIAGNOSIS — Z720 Tobacco use: Secondary | ICD-10-CM

## 2018-06-17 DIAGNOSIS — Z13220 Encounter for screening for lipoid disorders: Secondary | ICD-10-CM | POA: Diagnosis not present

## 2018-06-17 DIAGNOSIS — B20 Human immunodeficiency virus [HIV] disease: Secondary | ICD-10-CM

## 2018-06-17 DIAGNOSIS — R072 Precordial pain: Secondary | ICD-10-CM | POA: Diagnosis not present

## 2018-06-17 DIAGNOSIS — F1021 Alcohol dependence, in remission: Secondary | ICD-10-CM

## 2018-06-17 DIAGNOSIS — Z23 Encounter for immunization: Secondary | ICD-10-CM

## 2018-06-17 NOTE — Progress Notes (Signed)
I will be sure too! I actually just saw him on Friday (09/20) and he told me he has a new phone number but I wasn't smart enough to write it down!

## 2018-06-17 NOTE — Assessment & Plan Note (Signed)
Doing well on Biktarvy - I am worried however that he may be taking this with other minerals/cations - discussed proper timing of separating from these medications. Will check VL/CD4 and other yearly labs related to HIV care today. He does not need to continue on Bactrim therapy as his CD4 is > 200. He will return in 4 months to check in with me again. Can do labs at the visit considering his transportation issues. Bus tickets provided today.

## 2018-06-17 NOTE — Progress Notes (Signed)
Patient Name: [name] Herring  Date of Birth: 09-Sep-1961  MRN: [id number]  PCP: Charlott Rakes, MD   Patient Active Problem List   Diagnosis Date Noted  . Protein C deficiency ([place]) 07/05/2017  . Benign neoplasm of transverse colon   . Unintentional weight loss 06/14/2017  . Chest pain on exertion 06/12/2017  . Emphysema lung ([place]) 11/20/2016  . DVT of lower extremity, bilateral ([place]) 11/04/2016  . Tobacco abuse 11/02/2016  . PE (pulmonary thromboembolism) ([place]) 11/02/2016  . Cocaine dependence with cocaine-induced mood disorder ([place]) 03/29/2016  . HTN (hypertension) 08/25/2015  . Paranoid schizophrenia, chronic condition ([place]) 12/30/2014  . Alcohol use disorder, severe, in early remission ([place]) 12/27/2014  . Human immunodeficiency virus (HIV) disease ([place]) 11/09/2006  . CA IN SITU, RECTUM 11/09/2006  . Hypothyroidism 11/09/2006  . DISORDER, BIPOLAR NOS 11/09/2006  . Depression 11/09/2006    SUBJECTIVE:  Brief Narrative:  [name] Herring is a 57 y.o. AA male with a history of HIV infection. Originally diagnosed 08/18/1998 and previously followed at [place] [facility]. HIV Risk: MSM. History of OIs: uncertain. History of ETOH/cocaine abuse, previously homeless. Previous ART includes Kaletra + Combivir. Now on Genvoya.   Chief Complaint  Patient presents with  . Follow-up    HIV     HPI/ROS: [name] Herring is here for follow up on his HIV disease. He has not been seen in the office since December 2018 but has been taking his medications and working with [employer]. We switched him to [facility] a few months back after we realized a drug interaction with his blood thinner (has protein c deficiency and s/p IVC filter for PE). He reports to have been taking medications every day including HIV, psych, blood thinner and others as prescribed. He ran out of his magnesium but it will be sent to his house soon. He thinks he has gained some weight which he is happy about. He only drinks one day of the week  now (generally < 2 drinks) and has not used cocaine in months now.   Has tells me that he has had some stabbing pains, "crushing" in the left chest he thinks is his IVC filter causing pain. He has experienced these episodes with activity (mopping the floor, walking up stairs) that resolves with rest. Lasts < 2 minutes. Has happened twice now over the course of the last month. Does have some swelling in his feet at times but resolves without intervention. No SOB/DOE, no jaw/arm pain or other referred pain. No diaphoresis. No palpitations. Measures BP at home and tells me SBP is generally < 110. Today he has not taken his blood pressure medication yet today. He still smokes daily.   Feels his depression is under stable control. Has been spending a lot of time with his children lately. Sleeping well.   Review of Systems  Constitutional: Negative for chills, diaphoresis, fever, malaise/fatigue and weight loss.  HENT: Negative for sore throat.        No dental problems  Respiratory: Negative for cough and sputum production.   Cardiovascular: Negative for chest pain and leg swelling.  Gastrointestinal: Negative for abdominal pain, blood in stool, diarrhea and vomiting.  Genitourinary: Negative for dysuria and flank pain.  Musculoskeletal: Negative for joint pain, myalgias and neck pain.  Skin: Negative for rash.  Neurological: Positive for tremors and weakness. Negative for dizziness, tingling and headaches.  Psychiatric/Behavioral: Positive for depression and substance abuse (occasional ETOH ). The patient is not nervous/anxious and does not have  insomnia.    Past Medical History:  Diagnosis Date  . Depression   . HIV (human immunodeficiency virus infection) ([place])   . Hypertension   . Immune deficiency disorder ([place])   . PE (pulmonary thromboembolism) ([place]) 2018  . Personality disorder ([place])   . Schizophrenia [facility])    Outpatient Medications Prior to Visit  Medication Sig Dispense Refill  .  acetaminophen (TYLENOL) 500 MG tablet Take 1 tablet (500 mg total) by mouth every 6 (six) hours as needed for moderate pain. 30 tablet 0  . apixaban (ELIQUIS) 5 MG TABS tablet Take 1 tablet (5 mg total) by mouth 2 (two) times daily. 180 tablet 1  . benztropine (COGENTIN) 1 MG tablet 1 tab in the morning 2 tabs at night (Patient taking differently: Take 1-2 mg by mouth See admin instructions. Take 1 tablet in the morning and take 2 tablets at night) 14 tablet 0  . bictegravir-emtricitabine-tenofovir AF (BIKTARVY) 50-200-25 MG TABS tablet Take 1 tablet by mouth daily. Try to take at the same time each day with or without food. 30 tablet 5  . butalbital-acetaminophen-caffeine (ESGIC) 50-325-40 MG tablet Take 1 tablet by mouth 2 (two) times daily as needed for headache. 30 tablet 0  . cetirizine (ZYRTEC) 10 MG tablet Take 1 tablet (10 mg total) by mouth daily. 30 tablet 1  . gabapentin (NEURONTIN) 600 MG tablet Take 600 mg by mouth at bedtime.    . haloperidol (HALDOL) 10 MG tablet Take 0.5 tablets (5 mg total) by mouth 2 (two) times daily.    Marland Kitchen lisinopril (PRINIVIL,ZESTRIL) 10 MG tablet Take 1 tablet (10 mg total) by mouth daily. 90 tablet 1  . magnesium oxide (MAG-OX) 400 (241.3 Mg) MG tablet Take 1 tablet (400 mg total) by mouth 2 (two) times daily. 60 tablet 0  . metoprolol tartrate (LOPRESSOR) 25 MG tablet Take 1 tablet (25 mg total) by mouth 2 (two) times daily. 180 tablet 1  . mirtazapine (REMERON) 30 MG tablet Take 1 tablet (30 mg total) by mouth at bedtime. 30 tablet 3  . Multiple Vitamin (MULTIVITAMIN WITH MINERALS) TABS tablet Take 1 tablet by mouth daily.    . pantoprazole (PROTONIX) 40 MG tablet Take 1 tablet (40 mg total) by mouth 2 (two) times daily before a meal. 60 tablet 1  . polyethylene glycol (MIRALAX / GLYCOLAX) packet Take 17 g by mouth daily.    Marland Kitchen tiotropium (SPIRIVA) 18 MCG inhalation capsule Place 1 capsule (18 mcg total) into inhaler and inhale daily. 90 capsule 1  . traZODone  (DESYREL) 50 MG tablet Take 1 tablet (50 mg total) by mouth at bedtime. 30 tablet 3  . umeclidinium bromide (INCRUSE ELLIPTA) 62.5 MCG/INH AEPB Inhale 1 puff into the lungs daily. 30 each 2  . Valbenazine Tosylate (INGREZZA) 80 MG CAPS Take 80 mg by mouth 2 (two) times daily.     . metoprolol tartrate (LOPRESSOR) 25 MG tablet TAKE ONE TABLET BY MOUTH TWICE A DAY 60 tablet 2  . promethazine-dextromethorphan (PROMETHAZINE-DM) 6.25-15 MG/5ML syrup Take 5 mLs by mouth 4 (four) times daily as needed for cough. (Patient not taking: Reported on 11/26/2017) 150 mL 0  . sulfamethoxazole-trimethoprim (BACTRIM DS) 800-160 MG tablet Take 1 tablet by mouth daily. (Patient not taking: Reported on 06/17/2018) 30 tablet 1   No facility-administered medications prior to visit.    Allergies  Allergen Reactions  . Amoxicillin Shortness Of Breath and Rash    Has patient had a PCN reaction causing immediate rash,  facial/tongue/throat swelling, [name] Has patient had a PCN reaction causing severe rash involving mucus membranes or skin necrosis: no Has patient had a PCN reaction that required hospitalization: yes drs office visit Has patient had a PCN reaction occurring within the last 10 years: no If all of the above answers are "NO", then may proceed with Cephalosporin use.   . Latex Shortness Of Breath  . Abilify [Aripiprazole] Other (See Comments)    Double vision   Social History   Tobacco Use  . Smoking status: Current Every Day Smoker    Packs/day: 1.00    Years: 33.00    Pack years: 33.00    Types: Cigarettes  . Smokeless tobacco: Never Used  . Tobacco comment: cutting back  Substance Use Topics  . Alcohol use: Yes    Alcohol/week: 3.0 standard drinks    Types: 3 Shots of liquor per week    Comment: per patient drinks one pint a month 06/20/17  . Drug use: Yes    Comment: crack    Objective:  Vitals:   06/17/18 1113  BP: 137/87  Pulse: (!) 110  Temp:  98.1 F (36.7 C)  TempSrc: Oral  SpO2: 97%  Weight: 139 lb (63 kg)  Height: 5\' 9"  (1.753 m)   Body mass index is 20.53 kg/m.  Physical Exam  Constitutional: He is oriented to person, place, and time.  Thin AA male. Chronically ill-appearing. Appears that he has put on some weight since our last visit together.   HENT:  Mouth/Throat: Oropharynx is clear and moist.  Eyes: Pupils are equal, round, and reactive to light. No scleral icterus.  Cardiovascular: Regular rhythm, normal heart sounds and intact distal pulses. Tachycardia present.  No murmur heard. Pulmonary/Chest: Effort normal and breath sounds normal. No respiratory distress. He has no wheezes. He has no rales. He exhibits no tenderness.  Abdominal: Soft. Bowel sounds are normal. He exhibits no distension. There is no tenderness.  Musculoskeletal: He exhibits no edema.  Lymphadenopathy:    He has no cervical adenopathy.  Neurological: He is alert and oriented to person, place, and time. He has normal strength.  Skin: Skin is warm and dry.  Psychiatric: Mood, affect and judgment normal.   Lab Results Lab Results  Component Value Date   WBC 3.4 (L) 10/29/2017   HGB 10.2 (L) 10/29/2017   HCT 32.9 (L) 10/29/2017   MCV 81.0 10/29/2017   PLT 134 (L) 10/29/2017    Lab Results  Component Value Date   CREATININE 0.88 10/29/2017   BUN 18 10/29/2017   NA 140 10/29/2017   K 4.0 10/29/2017   CL 108 10/29/2017   CO2 20 (L) 10/29/2017    Lab Results  Component Value Date   ALT 26 10/29/2017   AST 40 10/29/2017   ALKPHOS 51 10/29/2017   BILITOT 1.0 10/29/2017    Lab Results  Component Value Date   CHOL 153 08/05/2015   HDL 93 08/05/2015   LDLCALC 30 08/05/2015   TRIG 151 (H) 08/05/2015   CHOLHDL 1.6 08/05/2015   HIV 1 RNA Quant (copies/mL)  Date Value  10/24/2017 26 (H)  09/12/2017 35 (H)  07/18/2017 21 (H)   CD4 T Cell Abs (/uL)  Date Value  10/24/2017 290 (L)  09/12/2017 240 (L)  07/18/2017 280 (L)    No results found for: HAV Lab Results  Component Value Date   HEPBSAG Negative 09/11/2007   HEPBSAB Undetermined 09/11/2007   Lab Results  Component Value Date   HCVAB Negative 09/11/2007   Lab Results  Component Value Date   CHLAMYDIAWP Negative 01/13/2015   N Negative 01/13/2015   No results found for: GCPROBEAPT No results found for: QUANTGOLD No results found for: RPR    Problem List Items Addressed This Visit      Other   Alcohol use disorder, severe, in early remission ([place])    Overall he looks much improved since our last meeting. Baseline tremors are improved and his state of mind seems a little sharper. Hopefully he is doing as well as he claims with regards to eliminating substances/ETOH.       Chest pain on exertion    Described to have infrequent episodes of short-duration stabbing/sharp chest pain < 2 min during exertion that resolves with rest. He is prescribed a beta-blocker however tachycardic on exam today. Describes his BP's to be < 409 systolic at home; may benefit from increasing beta blocker and decreasing his lisinopril. Will send a note to his PCP to let him know. EKG today reveals normal sinus rhythm without any conduction concern for ischemia in the office chest pain free. Discussed need to be evaluated in ER if these episodes do not resolve with rest or change/increase in severity or frequency. Will check lipid panel today as he is fasting. Alternatively he has a history of PE/DVT--no calf pain or swelling/warmth today; breath sounds are benign. Would have a low threshold to refer for STAT CT of the chest if these episodes increase without explanation.       Human immunodeficiency virus (HIV) disease ([place]) - Primary (Chronic)    Doing well on Biktarvy - I am worried however that he may be taking this with other minerals/cations - discussed proper timing of separating from these medications. Will check VL/CD4 and other yearly labs related to HIV care  today. He does not need to continue on Bactrim therapy as his CD4 is > 200. He will return in 4 months to check in with me again. Can do labs at the visit considering his transportation issues. Bus tickets provided today.       Relevant Orders   HIV-1 RNA quant-no reflex-bld   T-helper cell (CD4)- ([facility] only)   RPR   COMPLETE METABOLIC PANEL WITH GFR   CBC with Differential/Platelet   Hepatitis B surface antigen   Hepatitis B surface antibody,qualitative   Hepatitis A antibody, total   Hepatitis C antibody   Hepatitis B Core Antibody, total   Magnesium   Tobacco abuse    Ongoing daily smoker. Discussed quitting especially in the setting of CP that is new. Pre-contemplative.        Other Visit Diagnoses    Lipid screening       Relevant Orders   Lipid panel   Need for immunization against influenza       Relevant Orders   Flu Vaccine QUAD 36+ mos IM (Completed)     No orders of the defined types were placed in this encounter.  Return in about 4 months (around 10/17/2018).   Janene Madeira, MSN, NP-C [facility] [place] [facility] Pager: [phone]  06/17/18 1:51 PM

## 2018-06-17 NOTE — Patient Instructions (Signed)
Wonderful to see you today.   Continue taking your Biktarvy every day - please make sure you separate this out by at least 4 hours in between your magnesium and other multivitamins.   We gave you your flu shot today.   Will have you come back in 4 months to check in.

## 2018-06-17 NOTE — Assessment & Plan Note (Addendum)
Described to have infrequent episodes of short-duration stabbing/sharp chest pain < 2 min during exertion that resolves with rest. He is prescribed a beta-blocker however tachycardic on exam today. Describes his BP's to be < 182 systolic at home; may benefit from increasing beta blocker and decreasing his lisinopril. Will send a note to his PCP to let him know. EKG today reveals normal sinus rhythm without any conduction concern for ischemia in the office chest pain free. Discussed need to be evaluated in ER if these episodes do not resolve with rest or change/increase in severity or frequency. Will check lipid panel today as he is fasting. Alternatively he has a history of PE/DVT--no calf pain or swelling/warmth today; breath sounds are benign. Would have a low threshold to refer for STAT CT of the chest if these episodes increase without explanation.

## 2018-06-17 NOTE — Assessment & Plan Note (Signed)
Ongoing daily smoker. Discussed quitting especially in the setting of CP that is new. Pre-contemplative.

## 2018-06-17 NOTE — Assessment & Plan Note (Signed)
Overall he looks much improved since our last meeting. Baseline tremors are improved and his state of mind seems a little sharper. Hopefully he is doing as well as he claims with regards to eliminating substances/ETOH.

## 2018-06-19 LAB — COMPLETE METABOLIC PANEL WITH GFR
AG RATIO: 1.3 (calc) (ref 1.0–2.5)
ALT: 9 U/L (ref 9–46)
AST: 13 U/L (ref 10–35)
Albumin: 4 g/dL (ref 3.6–5.1)
Alkaline phosphatase (APISO): 48 U/L (ref 40–115)
BUN: 14 mg/dL (ref 7–25)
CALCIUM: 9.8 mg/dL (ref 8.6–10.3)
CHLORIDE: 109 mmol/L (ref 98–110)
CO2: 26 mmol/L (ref 20–32)
Creat: 0.94 mg/dL (ref 0.70–1.33)
GFR, EST NON AFRICAN AMERICAN: 90 mL/min/{1.73_m2} (ref >=60)
GFR, Est African American: 105 mL/min/{1.73_m2} (ref >=60)
GLOBULIN: 3.1 g/dL (ref 1.9–3.7)
Glucose, Bld: 85 mg/dL (ref 65–99)
POTASSIUM: 3.8 mmol/L (ref 3.5–5.3)
SODIUM: 139 mmol/L (ref 135–146)
Total Bilirubin: 0.3 mg/dL (ref 0.2–1.2)
Total Protein: 7.1 g/dL (ref 6.1–8.1)

## 2018-06-19 LAB — CBC WITH DIFFERENTIAL/PLATELET
BASOS ABS: 19 {cells}/uL (ref 0–200)
Basophils Relative: 0.4 %
EOS ABS: 19 {cells}/uL (ref 15–500)
EOS PCT: 0.4 %
HEMATOCRIT: 37.5 % — AB (ref 38.5–50.0)
HEMOGLOBIN: 12.5 g/dL — AB (ref 13.2–17.1)
LYMPHS ABS: 1006 {cells}/uL (ref 850–3900)
MCH: 29.2 pg (ref 27.0–33.0)
MCHC: 33.3 g/dL (ref 32.0–36.0)
MCV: 87.6 fL (ref 80.0–100.0)
MPV: 10 fL (ref 7.5–12.5)
Monocytes Relative: 10 %
NEUTROS ABS: 3187 {cells}/uL (ref 1500–7800)
Neutrophils Relative %: 67.8 %
Platelets: 156 10*3/uL (ref 140–400)
RBC: 4.28 10*6/uL (ref 4.20–5.80)
RDW: 17.7 % — ABNORMAL HIGH (ref 11.0–15.0)
Total Lymphocyte: 21.4 %
WBC: 4.7 10*3/uL (ref 3.8–10.8)
WBCMIX: 470 {cells}/uL (ref 200–950)

## 2018-06-19 LAB — T-HELPER CELL (CD4) - (RCID CLINIC ONLY)
CD4 T CELL ABS: 250 /uL — AB (ref 400–2700)
CD4 T CELL HELPER: 26 % — AB (ref 33–55)

## 2018-06-19 LAB — HEPATITIS B SURFACE ANTIGEN: HEP B S AG: NONREACTIVE

## 2018-06-19 LAB — LIPID PANEL
CHOL/HDL RATIO: 2 (calc) (ref <5.0)
Cholesterol: 140 mg/dL (ref <200)
HDL: 71 mg/dL (ref >40)
LDL Cholesterol (Calc): 49 mg/dL (calc)
NON-HDL CHOLESTEROL (CALC): 69 mg/dL (ref <130)
TRIGLYCERIDES: 118 mg/dL (ref <150)

## 2018-06-19 LAB — HIV-1 RNA QUANT-NO REFLEX-BLD
HIV 1 RNA Quant: <20 copies/mL — AB
HIV-1 RNA Quant, Log: <1.3 Log copies/mL — AB

## 2018-06-19 LAB — HEPATITIS B CORE ANTIBODY, TOTAL: Hep B Core Total Ab: REACTIVE — AB

## 2018-06-19 LAB — MAGNESIUM: Magnesium: 2.1 mg/dL (ref 1.5–2.5)

## 2018-06-19 LAB — HEPATITIS C ANTIBODY
Hepatitis C Ab: NONREACTIVE
SIGNAL TO CUT-OFF: 0.07 (ref <1.00)

## 2018-06-19 LAB — RPR: RPR Ser Ql: NONREACTIVE

## 2018-06-19 LAB — HEPATITIS A ANTIBODY, TOTAL: Hepatitis A AB,Total: REACTIVE — AB

## 2018-06-19 LAB — HEPATITIS B SURFACE ANTIBODY,QUALITATIVE: Hep B S Ab: BORDERLINE — AB

## 2018-06-19 NOTE — Progress Notes (Signed)
Mr. Patel is doing very well on his Biktarvy. Undetectable with stable CD4 count. Continue current plan.

## 2018-07-04 DIAGNOSIS — Z79899 Other long term (current) drug therapy: Secondary | ICD-10-CM | POA: Diagnosis not present

## 2018-07-09 NOTE — Progress Notes (Signed)
Home visit made today. I have been unable to reach [name] Herring by phone so a drive to the home was made today. [name] Herring states he has not been able to locate his old phone and he has a new phone and number now. New phone number provided. [name] Herring states he has been feeling well, denies any missed does and was able to show me all of his medications and the system he uses for taking them. His biggest concern right now is getting rid of the roaches in the home which are concerning for him. The office manager is aware and they are treating the problem. Instructed [name] Herring today on fall prevention to include maintaining a clear walkway in his home, making sure he has adequate lighting when getting up in the middle of the night. We also discussed allowing himself time to steady himself before taking the 1st step. [name] Herring verbalized understanding of education provided

## 2018-07-18 ENCOUNTER — Telehealth: Payer: Self-pay | Admitting: *Deleted

## 2018-07-18 NOTE — Telephone Encounter (Signed)
Initial Assessment Points to Consider for Care             Points are not all inclusive to services and educated provided but supports the patient's Individualized Plan of Care.   Is Home safe for visits? Yes / Are all firearms or weapons secure? yes  Insurance Coverage: Medicare  1st HIV Diagnosis: Nov 1999  Mode of HIV Transmission: MSM  Functional Status: able to perform ADL's independently with additional time given  Current Housing/Needs: no housing needs at this time, lives in an apartment alone   Social Support/System: Dtr who lives in [place] in [place]) no longer sister(Barbara)  Culture/Religion/Spirituality:none noted that would create barriers to care  Educational Background:   Legal Issues: none noted  Access/Utilization of Community Resources:27% no show rate  Mental Health Concerns/Diagnosis: Schizoaffective disorder  Alcohol and/or Drug Use: Hx of cocaine abuse, current alcohol abuse  Risk and Knowledge of HIV and Reduction in Transmission:  Very knowledgeable able mode of transmission and ways to reduce transmission Nutritional Needs: not at this time    Frequency / Duration of [place] visits: Effective 07/18/18  31mo1, 57mo2, 83mo1  4PRN's for complications with disease process/progression, medication changes or concerns   [facility] will assess for learning needs related to diagnosis and treatment regimen, provide education as needed, fill pill box if needed, address any barriers which may be preventing medication compliance, and communicating with care team including physician and case manager.   Individualized Plan Of Care with Certification Period from 07/18/18 to 10/16/18             a. Type of service(s) and care to be delivered: RN Case Management             b. Frequency and duration of service: Effective 07/18/18  40mo1, 27mo2, 14mo1, 4 prns for complications with disease process/progression, medication changes or concerns . Visits/Contact may be  conducted   telephonically or in person to best suit the patient.             c. Activity restrictions: Pt may be up as tolerated and can safely ambulate without the need for a assistive device. Additional time does need to be given to complete task              d. Safety Measures: Standard Precautions/Infection Control              e. Service Objectives and Goals: Service Objectives are to assist the pt with HIV medication regimen adherence and staying in care with the Infectious            Disease Clinic by identifying barriers to care. RN will address the     barriers that are identified by the patient. Current the patient is capable to taking his own medications but states he would like help and reminders with his care. Patient states he is struggling with alcoholism and thoughts of abusing drugs and would like to have a support person to engage with him. Declines rehab at this time               f. Equipment required: No additional equipment needs at this time              g. Functional Limitations: Vision. Pt has corrective glasses that he wears              h. Rehabilitation potential: Guarded              i. Diet and  Nutritional Needs: Regular Diet              j. Medications and treatments: Medications have been reconciled and reviewed and are a part of EPIC electronic file              k. Specific therapies if needed: RN              l. Pertinent diagnoses: HIV disease,  Hx of medication NonCompliance, Schizoaffective disorder             m. Expected outcome: Guarded

## 2018-07-19 NOTE — Telephone Encounter (Signed)
I approve this plan of care.

## 2018-07-29 ENCOUNTER — Other Ambulatory Visit: Payer: Self-pay | Admitting: Family Medicine

## 2018-07-29 DIAGNOSIS — I10 Essential (primary) hypertension: Secondary | ICD-10-CM

## 2018-07-31 ENCOUNTER — Emergency Department (HOSPITAL_COMMUNITY)
Admission: EM | Admit: 2018-07-31 | Discharge: 2018-07-31 | Disposition: A | Discharge status: Home / Self Care | Payer: Medicare Other | Attending: Emergency Medicine | Admitting: Emergency Medicine

## 2018-07-31 ENCOUNTER — Other Ambulatory Visit: Payer: Self-pay

## 2018-07-31 ENCOUNTER — Emergency Department (HOSPITAL_COMMUNITY): Payer: Medicare Other

## 2018-07-31 ENCOUNTER — Encounter (HOSPITAL_COMMUNITY): Payer: Self-pay | Admitting: Emergency Medicine

## 2018-07-31 DIAGNOSIS — B20 Human immunodeficiency virus [HIV] disease: Secondary | ICD-10-CM | POA: Diagnosis not present

## 2018-07-31 DIAGNOSIS — Z79899 Other long term (current) drug therapy: Secondary | ICD-10-CM | POA: Insufficient documentation

## 2018-07-31 DIAGNOSIS — F17210 Nicotine dependence, cigarettes, uncomplicated: Secondary | ICD-10-CM | POA: Diagnosis not present

## 2018-07-31 DIAGNOSIS — Z7901 Long term (current) use of anticoagulants: Secondary | ICD-10-CM | POA: Insufficient documentation

## 2018-07-31 DIAGNOSIS — J181 Lobar pneumonia, unspecified organism: Secondary | ICD-10-CM | POA: Insufficient documentation

## 2018-07-31 DIAGNOSIS — K2950 Unspecified chronic gastritis without bleeding: Secondary | ICD-10-CM | POA: Diagnosis not present

## 2018-07-31 DIAGNOSIS — J189 Pneumonia, unspecified organism: Secondary | ICD-10-CM | POA: Diagnosis not present

## 2018-07-31 DIAGNOSIS — R52 Pain, unspecified: Secondary | ICD-10-CM | POA: Diagnosis not present

## 2018-07-31 DIAGNOSIS — R079 Chest pain, unspecified: Secondary | ICD-10-CM | POA: Diagnosis not present

## 2018-07-31 DIAGNOSIS — I10 Essential (primary) hypertension: Secondary | ICD-10-CM | POA: Diagnosis not present

## 2018-07-31 DIAGNOSIS — R0789 Other chest pain: Secondary | ICD-10-CM | POA: Diagnosis not present

## 2018-07-31 LAB — HEPATIC FUNCTION PANEL
ALT: 18 U/L (ref 0–44)
AST: 26 U/L (ref 15–41)
Albumin: 3.2 g/dL — ABNORMAL LOW (ref 3.5–5.0)
Alkaline Phosphatase: 48 U/L (ref 38–126)
Bilirubin, Direct: 0.2 mg/dL (ref 0.0–0.2)
Indirect Bilirubin: 0.4 mg/dL (ref 0.3–0.9)
Total Bilirubin: 0.6 mg/dL (ref 0.3–1.2)
Total Protein: 6.1 g/dL — ABNORMAL LOW (ref 6.5–8.1)

## 2018-07-31 LAB — BASIC METABOLIC PANEL
Anion gap: 7 (ref 5–15)
BUN: 15 mg/dL (ref 6–20)
CO2: 26 mmol/L (ref 22–32)
Calcium: 9.4 mg/dL (ref 8.9–10.3)
Chloride: 109 mmol/L (ref 98–111)
Creatinine, Ser: 1 mg/dL (ref 0.61–1.24)
GFR calc Af Amer: >60 mL/min (ref >60)
GFR calc non Af Amer: >60 mL/min (ref >60)
Glucose, Bld: 82 mg/dL (ref 70–99)
Potassium: 3.2 mmol/L — ABNORMAL LOW (ref 3.5–5.1)
Sodium: 142 mmol/L (ref 135–145)

## 2018-07-31 LAB — CBC
HEMATOCRIT: 39.9 % (ref 39.0–52.0)
Hemoglobin: 12.6 g/dL — ABNORMAL LOW (ref 13.0–17.0)
MCH: 29 pg (ref 26.0–34.0)
MCHC: 31.6 g/dL (ref 30.0–36.0)
MCV: 91.9 fL (ref 80.0–100.0)
NRBC: 0 % (ref 0.0–0.2)
PLATELETS: 128 10*3/uL — AB (ref 150–400)
RBC: 4.34 MIL/uL (ref 4.22–5.81)
RDW: 18.5 % — AB (ref 11.5–15.5)
WBC: 5.7 10*3/uL (ref 4.0–10.5)

## 2018-07-31 LAB — LIPASE, BLOOD: Lipase: 26 U/L (ref 11–51)

## 2018-07-31 LAB — I-STAT TROPONIN, ED: Troponin i, poc: 0 ng/mL (ref 0.00–0.08)

## 2018-07-31 MED ORDER — DOXYCYCLINE HYCLATE 100 MG PO CAPS: 100.0000 mg | ORAL_CAPSULE | Freq: Two times a day (BID) | ORAL | 0 refills | Status: AC

## 2018-07-31 MED ORDER — DOXYCYCLINE HYCLATE 100 MG PO TABS
100.0000 mg | ORAL_TABLET | Freq: Once | ORAL | Status: AC
  Administered 2018-07-31: 100 mg via ORAL
  Filled 2018-07-31: qty 1

## 2018-07-31 MED ORDER — ALUM & MAG HYDROXIDE-SIMETH 200-200-20 MG/5ML PO SUSP
30.0000 mL | Freq: Once | ORAL | Status: AC
  Administered 2018-07-31: 30 mL via ORAL
  Filled 2018-07-31: qty 30

## 2018-07-31 NOTE — ED Notes (Signed)
Patient transported to X-ray 

## 2018-07-31 NOTE — ED Provider Notes (Signed)
[facility] EMERGENCY DEPARTMENT Provider Note  CSN: [id number] Arrival date & time: 07/31/18 7510  Chief Complaint(s) Chest Pain and Abdominal Pain  HPI [name] Herring is a 57 y.o. male h/o HIV with undetectable Quant and stable CD4 recently.  The history is provided by the patient.  Chest Pain   This is a new problem. The current episode started 6 to 12 hours ago. The problem occurs constantly. The problem has not changed since onset.The pain is associated with coughing. Pain location: right lateral. The quality of the pain is described as sharp and pleuritic. The pain does not radiate. Exacerbated by: coughing. Associated symptoms include abdominal pain, cough and sputum production. Pertinent negatives include no fever, no irregular heartbeat, no leg pain, no lower extremity edema, no nausea, no shortness of breath and no vomiting. Risk factors include male gender and alcohol intake.  His past medical history is significant for cancer, DVT, hypertension, MI and PE (on Eliquis).  Abdominal Pain   This is a recurrent problem. The current episode started 6 to 12 hours ago. The problem occurs constantly. The problem has not changed since onset.The pain is associated with alcohol use. The pain is located in the epigastric region. The pain is moderate. Pertinent negatives include fever, melena, nausea and vomiting.    Past Medical History Past Medical History:  Diagnosis Date  . Depression   . HIV (human immunodeficiency virus infection) ([place])   . Hypertension   . Immune deficiency disorder ([place])   . PE (pulmonary thromboembolism) ([place]) 2018  . Personality disorder ([place])   . Schizophrenia [facility])    Patient Active Problem List   Diagnosis Date Noted  . Protein C deficiency ([place]) 07/05/2017  . Benign neoplasm of transverse colon   . Unintentional weight loss 06/14/2017  . Chest pain on exertion 06/12/2017  . Emphysema lung ([place]) 11/20/2016  . DVT of lower extremity,  bilateral ([place]) 11/04/2016  . Tobacco abuse 11/02/2016  . PE (pulmonary thromboembolism) ([place]) 11/02/2016  . Cocaine dependence with cocaine-induced mood disorder ([place]) 03/29/2016  . HTN (hypertension) 08/25/2015  . Paranoid schizophrenia, chronic condition ([place]) 12/30/2014  . Alcohol use disorder, severe, in early remission ([place]) 12/27/2014  . Human immunodeficiency virus (HIV) disease ([place]) 11/09/2006  . CA IN SITU, RECTUM 11/09/2006  . Hypothyroidism 11/09/2006  . DISORDER, BIPOLAR NOS 11/09/2006  . Depression 11/09/2006   Home Medication(s) Prior to Admission medications   Medication Sig Start Date End Date Taking? Authorizing Provider  acetaminophen (TYLENOL) 500 MG tablet Take 1 tablet (500 mg total) by mouth every 6 (six) hours as needed for moderate pain. 11/07/16  Yes Eugenie Filler, MD  apixaban (ELIQUIS) 5 MG TABS tablet Take 1 tablet (5 mg total) by mouth 2 (two) times daily. 02/26/18  Yes Charlott Rakes, MD  benztropine (COGENTIN) 1 MG tablet 1 tab in the morning 2 tabs at night Patient taking differently: Take 1-2 mg by mouth See admin instructions. Take 1 tablet in the morning and take 2 tablets at night 11/10/16  Yes McClung, Angela M, PA-C  bictegravir-emtricitabine-tenofovir AF (BIKTARVY) 50-200-25 MG TABS tablet Take 1 tablet by mouth daily. Try to take at the same time each day with or without food. 03/01/18  Yes [place] Callas, NP  butalbital-acetaminophen-caffeine (ESGIC) 50-325-40 MG tablet Take 1 tablet by mouth 2 (two) times daily as needed for headache. 02/26/18  Yes Charlott Rakes, MD  cetirizine (ZYRTEC) 10 MG tablet Take 1 tablet (10 mg total) by mouth  daily. 02/26/18  Yes Charlott Rakes, MD  gabapentin (NEURONTIN) 600 MG tablet Take 600 mg by mouth at bedtime.   Yes [provider]  lisinopril (PRINIVIL,ZESTRIL) 10 MG tablet Take 1 tablet (10 mg total) by mouth daily. 02/26/18  Yes Charlott Rakes, MD  magnesium oxide (MAG-OX) 400 (241.3 Mg) MG tablet  Take 1 tablet (400 mg total) by mouth 2 (two) times daily. 11/07/16  Yes Eugenie Filler, MD  metoprolol tartrate (LOPRESSOR) 25 MG tablet Take 1 tablet (25 mg total) by mouth 2 (two) times daily. 02/26/18  Yes Charlott Rakes, MD  mirtazapine (REMERON) 30 MG tablet Take 1 tablet (30 mg total) by mouth at bedtime. 06/20/17  Yes [place] Callas, NP  Multiple Vitamin (MULTIVITAMIN WITH MINERALS) TABS tablet Take 1 tablet by mouth daily. 11/08/16  Yes Eugenie Filler, MD  pantoprazole (PROTONIX) 40 MG tablet Take 1 tablet (40 mg total) by mouth 2 (two) times daily before a meal. 06/20/17  Yes Dixon, Melton Krebs, NP  polyethylene glycol (MIRALAX / GLYCOLAX) packet Take 17 g by mouth daily.   Yes [provider]  tiotropium (SPIRIVA) 18 MCG inhalation capsule Place 1 capsule (18 mcg total) into inhaler and inhale daily. 02/26/18  Yes Charlott Rakes, MD  traZODone (DESYREL) 50 MG tablet Take 1 tablet (50 mg total) by mouth at bedtime. 07/16/17  Yes Newlin, Charlane Ferretti, MD  umeclidinium bromide (INCRUSE ELLIPTA) 62.5 MCG/INH AEPB Inhale 1 puff into the lungs daily. 03/01/18  Yes Charlott Rakes, MD  Valbenazine Tosylate (INGREZZA) 80 MG CAPS Take 80 mg by mouth 2 (two) times daily.    Yes [provider]  doxycycline (VIBRAMYCIN) 100 MG capsule Take 1 capsule (100 mg total) by mouth 2 (two) times daily for 7 days. 07/31/18 08/07/18  Fatima Blank, MD  haloperidol (HALDOL) 10 MG tablet Take 0.5 tablets (5 mg total) by mouth 2 (two) times daily. Patient not taking: Reported on 07/31/2018 06/18/17   Domenic Polite, MD                                                                                                                                    Past Surgical History Past Surgical History:  Procedure Laterality Date  . COLONOSCOPY WITH PROPOFOL N/A 06/15/2017   Procedure: COLONOSCOPY WITH PROPOFOL;  Surgeon: Irene Shipper, MD;  Location: [facility] ENDOSCOPY;  Service: Endoscopy;  Laterality:  N/A;  . ESOPHAGOGASTRODUODENOSCOPY (EGD) WITH PROPOFOL N/A 06/15/2017   Procedure: ESOPHAGOGASTRODUODENOSCOPY (EGD) WITH PROPOFOL;  Surgeon: Irene Shipper, MD;  Location: [facility];  Service: Endoscopy;  Laterality: N/A;  . IR GENERIC HISTORICAL  11/04/2016   IR IVC FILTER PLMT / S&I Burke Keels GUID/MOD SED 11/04/2016 Aletta Edouard, MD [facility]-INTERV RAD   Family History Family History  Problem Relation Age of Onset  . CAD Mother   . Kidney disease Sister   . Lupus Brother   . Cancer Maternal Grandmother   .  Stroke Paternal Grandmother     Social History Social History   Tobacco Use  . Smoking status: Current Every Day Smoker    Packs/day: 1.00    Years: 33.00    Pack years: 33.00    Types: Cigarettes  . Smokeless tobacco: Never Used  . Tobacco comment: cutting back  Substance Use Topics  . Alcohol use: Yes    Alcohol/week: 3.0 standard drinks    Types: 3 Shots of liquor per week    Comment: per patient drinks one pint a month 06/20/17  . Drug use: Yes    Comment: crack    Allergies Amoxicillin; Latex; and Abilify [aripiprazole]  Review of Systems Review of Systems  Constitutional: Negative for fever.  Respiratory: Positive for cough and sputum production. Negative for shortness of breath.   Cardiovascular: Positive for chest pain.  Gastrointestinal: Positive for abdominal pain. Negative for melena, nausea and vomiting.   All other systems are reviewed and are negative for acute change except as noted in the HPI  Physical Exam Vital Signs  I have reviewed the triage vital signs BP 135/89 (BP Location: Right Arm)   Pulse 74   Temp (!) 97.5 F (36.4 C) (Oral)   Resp 20   Ht 5\' 9"  (1.753 m)   Wt 63 kg   SpO2 99%   BMI 20.53 kg/m   Physical Exam  Constitutional: He is oriented to person, place, and time. He appears well-developed and well-nourished. No distress.  HENT:  Head: Normocephalic and atraumatic.  Nose: Nose normal.  Eyes: Pupils are equal, round, and  reactive to light. Conjunctivae and EOM are normal. Right eye exhibits no discharge. Left eye exhibits no discharge. No scleral icterus.  Neck: Normal range of motion. Neck supple.  Cardiovascular: Normal rate and regular rhythm. Exam reveals no gallop and no friction rub.  No murmur heard. Pulmonary/Chest: Effort normal. No stridor. No respiratory distress. He has wheezes in the right lower field. He has rales in the right lower field. He exhibits tenderness.    Abdominal: Soft. He exhibits no distension. There is tenderness in the epigastric area. There is no rigidity.  Musculoskeletal: He exhibits no edema or tenderness.  Neurological: He is alert and oriented to person, place, and time.  Skin: Skin is warm and dry. No rash noted. He is not diaphoretic. No erythema.  Psychiatric: He has a normal mood and affect.  Vitals reviewed.   ED Results and Treatments Labs (all labs ordered are listed, but only abnormal results are displayed) Labs Reviewed  BASIC METABOLIC PANEL - Abnormal; Notable for the following components:      Result Value   Potassium 3.2 (*)    All other components within normal limits  CBC - Abnormal; Notable for the following components:   Hemoglobin 12.6 (*)    RDW 18.5 (*)    Platelets 128 (*)    All other components within normal limits  HEPATIC FUNCTION PANEL - Abnormal; Notable for the following components:   Total Protein 6.1 (*)    Albumin 3.2 (*)    All other components within normal limits  LIPASE, BLOOD  I-STAT TROPONIN, ED  EKG  EKG Interpretation  Date/Time:  Wednesday July 31 2018 04:18:34 EST Ventricular Rate:  76 PR Interval:    QRS Duration: 97 QT Interval:  397 QTC Calculation: 447 R Axis:   -7 Text Interpretation:  Sinus rhythm Borderline repolarization abnormality Baseline wander Otherwise no significant change  Confirmed by Addison Lank [phone]) on 07/31/2018 4:40:45 AM      Radiology Dg Chest 2 View  Result Date: 07/31/2018 CLINICAL DATA:  Mid chest pain tonight.  Smoker. EXAM: CHEST - 2 VIEW COMPARISON:  01/31/2018 FINDINGS: Normal heart size and pulmonary vascularity. Slight interstitial pattern to the right lung base is new since previous study and may represent early interstitial pneumonia. No consolidation or edema. Small bilateral pleural effusions. No pneumothorax. Calcification of the aorta. Inferior vena caval filter. IMPRESSION: Slight interstitial pattern to the right base may represent early interstitial pneumonia. Electronically Signed   By: Lucienne Capers M.D.   On: 07/31/2018 04:51   Pertinent labs & imaging results that were available during my care of the patient were reviewed by me and considered in my medical decision making (see chart for details).  Medications Ordered in ED Medications  doxycycline (VIBRA-TABS) tablet 100 mg (has no administration in time range)  alum & mag hydroxide-simeth (MAALOX/MYLANTA) 200-200-20 MG/5ML suspension 30 mL (30 mLs Oral Given 07/31/18 0530)                                                                                                                                    Procedures Procedures  (including critical care time)  Medical Decision Making / ED Course I have reviewed the nursing notes for this encounter and the patient's prior records (if available in EHR or on provided paperwork).    Highly atypical chest pain.  EKG without acute ischemic changes.  Initial troponin negative.  Chest x-ray with right lower lobe opacity.  On lung exam patient has mild rales and wheezing in the right lower lobe.  Given the history of coughing with very neurologic changes, presuming early pneumonia.  Labs are grossly reassuring without leukocytosis.  Patient is not septic. will treat with doxycycline.  Doubt ACS, PE, or dissection.  Additionally  patient with epigastric abdominal discomfort following alcohol ingestion.  No emesis.  No evidence of biliary obstruction or pancreatitis.  Given GI cocktail resulting in complete resolution.  Able to tolerate oral intake.  Likely gastritis.  Doubt other serious intra-abdominal inflammatory/infectious process requiring imaging.  The patient appears reasonably screened and/or stabilized for discharge and I doubt any other medical condition or other [facility] requiring further screening, evaluation, or treatment in the ED at this time prior to discharge.  The patient is safe for discharge with strict return precautions.   Final Clinical Impression(s) / ED Diagnoses Final diagnoses:  Community acquired pneumonia of right lower lobe of lung ([place])  Other chronic gastritis without hemorrhage   Disposition: Discharge  Condition: Good  I have discussed the results, Dx and Tx plan with the patient who expressed understanding and agree(s) with the plan. Discharge instructions discussed at great length. The patient was given strict return precautions who verbalized understanding of the instructions. No further questions at time of discharge.    ED Discharge Orders         Ordered    doxycycline (VIBRAMYCIN) 100 MG capsule  2 times daily     07/31/18 3007           Follow Up: Charlott Rakes, MD [place] [id number] [phone]  Schedule an appointment as soon as possible for a visit  in 3-5 days, If symptoms do not improve or  worsen      This chart was dictated using voice recognition software.  Despite best efforts to proofread,  errors can occur which can change the documentation meaning.   Fatima Blank, MD 07/31/18 [phone]

## 2018-07-31 NOTE — ED Triage Notes (Signed)
BIB [facility] from home with c/o of chest pain & abdominal pain that began approximately 6 hours ago. Hx of HIV but is compliant with medications. Hx of stomach ulcers as well.

## 2018-07-31 NOTE — ED Notes (Signed)
Patient verbalizes understanding of discharge instructions. Opportunity for questioning and answers were provided. Armband removed by staff, pt discharged from ED ambulatory with bus pass.  

## 2018-08-05 ENCOUNTER — Other Ambulatory Visit: Payer: Self-pay | Admitting: Family Medicine

## 2018-08-05 DIAGNOSIS — I10 Essential (primary) hypertension: Secondary | ICD-10-CM

## 2018-08-07 ENCOUNTER — Ambulatory Visit: Payer: Medicare Other | Attending: Family Medicine | Admitting: Family Medicine

## 2018-08-07 ENCOUNTER — Encounter: Payer: Self-pay | Admitting: Family Medicine

## 2018-08-07 VITALS — BP 134/89 | HR 102 | Ht 69.0 in | Wt 135.8 lb

## 2018-08-07 DIAGNOSIS — F17210 Nicotine dependence, cigarettes, uncomplicated: Secondary | ICD-10-CM

## 2018-08-07 DIAGNOSIS — F200 Paranoid schizophrenia: Secondary | ICD-10-CM | POA: Insufficient documentation

## 2018-08-07 DIAGNOSIS — Z7901 Long term (current) use of anticoagulants: Secondary | ICD-10-CM | POA: Diagnosis not present

## 2018-08-07 DIAGNOSIS — I10 Essential (primary) hypertension: Secondary | ICD-10-CM | POA: Diagnosis not present

## 2018-08-07 DIAGNOSIS — Z888 Allergy status to other drugs, medicaments and biological substances status: Secondary | ICD-10-CM | POA: Insufficient documentation

## 2018-08-07 DIAGNOSIS — R51 Headache: Secondary | ICD-10-CM | POA: Insufficient documentation

## 2018-08-07 DIAGNOSIS — G4709 Other insomnia: Secondary | ICD-10-CM

## 2018-08-07 DIAGNOSIS — Z86711 Personal history of pulmonary embolism: Secondary | ICD-10-CM | POA: Diagnosis not present

## 2018-08-07 DIAGNOSIS — E876 Hypokalemia: Secondary | ICD-10-CM | POA: Insufficient documentation

## 2018-08-07 DIAGNOSIS — Z91040 Latex allergy status: Secondary | ICD-10-CM | POA: Insufficient documentation

## 2018-08-07 DIAGNOSIS — Z880 Allergy status to penicillin: Secondary | ICD-10-CM | POA: Diagnosis not present

## 2018-08-07 DIAGNOSIS — Z79899 Other long term (current) drug therapy: Secondary | ICD-10-CM | POA: Insufficient documentation

## 2018-08-07 DIAGNOSIS — J438 Other emphysema: Secondary | ICD-10-CM | POA: Diagnosis not present

## 2018-08-07 DIAGNOSIS — D6859 Other primary thrombophilia: Secondary | ICD-10-CM | POA: Insufficient documentation

## 2018-08-07 DIAGNOSIS — F329 Major depressive disorder, single episode, unspecified: Secondary | ICD-10-CM | POA: Diagnosis not present

## 2018-08-07 DIAGNOSIS — K264 Chronic or unspecified duodenal ulcer with hemorrhage: Secondary | ICD-10-CM | POA: Insufficient documentation

## 2018-08-07 DIAGNOSIS — G4700 Insomnia, unspecified: Secondary | ICD-10-CM | POA: Diagnosis not present

## 2018-08-07 DIAGNOSIS — Z86718 Personal history of other venous thrombosis and embolism: Secondary | ICD-10-CM | POA: Insufficient documentation

## 2018-08-07 DIAGNOSIS — B20 Human immunodeficiency virus [HIV] disease: Secondary | ICD-10-CM | POA: Insufficient documentation

## 2018-08-07 MED ORDER — TIOTROPIUM BROMIDE MONOHYDRATE 18 MCG IN CAPS: 18.0000 ug | ORAL_CAPSULE | Freq: Every day | RESPIRATORY_TRACT | 1 refills | Status: DC

## 2018-08-07 MED ORDER — LISINOPRIL 10 MG PO TABS: 10.0000 mg | ORAL_TABLET | Freq: Every day | ORAL | 1 refills | Status: DC

## 2018-08-07 MED ORDER — TRAZODONE HCL 50 MG PO TABS: 50.0000 mg | ORAL_TABLET | Freq: Every day | ORAL | 3 refills | Status: DC

## 2018-08-07 MED ORDER — METOPROLOL TARTRATE 25 MG PO TABS: 25.0000 mg | ORAL_TABLET | Freq: Two times a day (BID) | ORAL | 1 refills | Status: DC

## 2018-08-07 MED ORDER — APIXABAN 5 MG PO TABS: 5.0000 mg | ORAL_TABLET | Freq: Two times a day (BID) | ORAL | 1 refills | Status: DC

## 2018-08-07 NOTE — Patient Instructions (Signed)

## 2018-08-07 NOTE — Progress Notes (Signed)
Subjective:  Patient ID: [name] Herring, male    DOB: [birth year]  Age: 57 y.o. MRN: [id number]  CC: Hypertension and Headache   HPI [name] Herring  Is a 57 year old male with a history of hypertension, paranoid schizophrenia, HIV ( currently on antiretroviral therapy), bilateral submassive pulmonary embolism, acute DVT of branch of the R Gastrocnemius vein, left lower extremity acute mobile thrombus of the proximal popliteal vein,acute DVT of the  distal to mid posterior tibial vein with right heart strain, placement of IVC filter in 10/2016, protein C deficiency, previous GI bleed secondary to duodenal ulcer here for follow-up visit.  He had an ED visit 1 week ago to [facility] where he had presented with cough, chest pains, wheezing on exam and chest x-ray revealed slight interstitial pattern to the right base representing early interstitial pneumonia.  He was prescribed doxycycline and subsequently discharged. He presents today reporting feeling whole lot better and denies cough, chest pains or wheezing but he does have intermittent headaches.  Denies blurry vision.   He is requesting refill of his antihypertensives and endorses compliance with his medications. Review of his most recent labs indicate hypokalemia of 3.3 and he informs me his potassium pills were discontinued by infectious disease due to 'the risk of developing renal failure.'  His last visit to infectious disease was on 06/17/2018. He does have a history of a 35-pack-year history of smoking and is not ready to quit at this time He receives his mental health medications through the [employer].  Past Medical History:  Diagnosis Date  . Depression   . HIV (human immunodeficiency virus infection) ([place])   . Hypertension   . Immune deficiency disorder ([place])   . PE (pulmonary thromboembolism) ([place]) 2018  . Personality disorder ([place])   . Schizophrenia [place])     Past Surgical History:  Procedure Laterality Date  .  COLONOSCOPY WITH PROPOFOL N/A 06/15/2017   Procedure: COLONOSCOPY WITH PROPOFOL;  Surgeon: Irene Shipper, MD;  [facility];  Service: Endoscopy;  Laterality: N/A;  . ESOPHAGOGASTRODUODENOSCOPY (EGD) WITH PROPOFOL N/A 06/15/2017   Procedure: ESOPHAGOGASTRODUODENOSCOPY (EGD) WITH PROPOFOL;  Surgeon: Irene Shipper, MD;  [facility];  Service: Endoscopy;  Laterality: N/A;  . IR GENERIC HISTORICAL  11/04/2016   IR IVC FILTER PLMT / S&I Burke Keels GUID/MOD SED 11/04/2016 Aletta Edouard, MD [facility]-INTERV RAD    Allergies  Allergen Reactions  . Amoxicillin Shortness Of Breath and Rash    Has patient had a PCN reaction causing immediate rash, facial/tongue/throat swelling, [name] Has patient had a PCN reaction causing severe rash involving mucus membranes or skin necrosis: no Has patient had a PCN reaction that required hospitalization: yes drs office visit Has patient had a PCN reaction occurring within the last 10 years: no If all of the above answers are "NO", then may proceed with Cephalosporin use.   . Latex Shortness Of Breath  . Abilify [Aripiprazole] Other (See Comments)    Double vision     Outpatient Medications Prior to Visit  Medication Sig Dispense Refill  . acetaminophen (TYLENOL) 500 MG tablet Take 1 tablet (500 mg total) by mouth every 6 (six) hours as needed for moderate pain. 30 tablet 0  . benztropine (COGENTIN) 1 MG tablet 1 tab in the morning 2 tabs at night (Patient taking differently: Take 1-2 mg by mouth See admin instructions. Take 1 tablet in the morning and take 2 tablets at night) 14 tablet  0  . bictegravir-emtricitabine-tenofovir AF (BIKTARVY) 50-200-25 MG TABS tablet Take 1 tablet by mouth daily. Try to take at the same time each day with or without food. 30 tablet 5  . butalbital-acetaminophen-caffeine (ESGIC) 50-325-40 MG tablet Take 1 tablet by mouth 2 (two) times daily as needed for headache. 30 tablet 0  . cetirizine  (ZYRTEC) 10 MG tablet Take 1 tablet (10 mg total) by mouth daily. 30 tablet 1  . doxycycline (VIBRAMYCIN) 100 MG capsule Take 1 capsule (100 mg total) by mouth 2 (two) times daily for 7 days. 14 capsule 0  . gabapentin (NEURONTIN) 600 MG tablet Take 600 mg by mouth at bedtime.    . magnesium oxide (MAG-OX) 400 (241.3 Mg) MG tablet Take 1 tablet (400 mg total) by mouth 2 (two) times daily. 60 tablet 0  . mirtazapine (REMERON) 30 MG tablet Take 1 tablet (30 mg total) by mouth at bedtime. 30 tablet 3  . Multiple Vitamin (MULTIVITAMIN WITH MINERALS) TABS tablet Take 1 tablet by mouth daily.    . pantoprazole (PROTONIX) 40 MG tablet Take 1 tablet (40 mg total) by mouth 2 (two) times daily before a meal. 60 tablet 1  . polyethylene glycol (MIRALAX / GLYCOLAX) packet Take 17 g by mouth daily.    Marland Kitchen umeclidinium bromide (INCRUSE ELLIPTA) 62.5 MCG/INH AEPB Inhale 1 puff into the lungs daily. 30 each 2  . Valbenazine Tosylate (INGREZZA) 80 MG CAPS Take 80 mg by mouth 2 (two) times daily.     Marland Kitchen apixaban (ELIQUIS) 5 MG TABS tablet Take 1 tablet (5 mg total) by mouth 2 (two) times daily. 180 tablet 1  . lisinopril (PRINIVIL,ZESTRIL) 10 MG tablet Take 1 tablet (10 mg total) by mouth daily. 90 tablet 1  . metoprolol tartrate (LOPRESSOR) 25 MG tablet Take 1 tablet (25 mg total) by mouth 2 (two) times daily. 180 tablet 1  . tiotropium (SPIRIVA) 18 MCG inhalation capsule Place 1 capsule (18 mcg total) into inhaler and inhale daily. 90 capsule 1  . traZODone (DESYREL) 50 MG tablet Take 1 tablet (50 mg total) by mouth at bedtime. 30 tablet 3  . haloperidol (HALDOL) 10 MG tablet Take 0.5 tablets (5 mg total) by mouth 2 (two) times daily. (Patient not taking: Reported on 07/31/2018)     No facility-administered medications prior to visit.     ROS Review of Systems  Constitutional: Negative for activity change and appetite change.  HENT: Negative for sinus pressure and sore throat.   Eyes: Negative for visual  disturbance.  Respiratory: Negative for cough, chest tightness and shortness of breath.   Cardiovascular: Negative for chest pain and leg swelling.  Gastrointestinal: Negative for abdominal distention, abdominal pain, constipation and diarrhea.  Endocrine: Negative.   Genitourinary: Negative for dysuria.  Musculoskeletal: Negative for joint swelling and myalgias.  Skin: Negative for rash.  Allergic/Immunologic: Negative.   Neurological: Positive for headaches. Negative for weakness, light-headedness and numbness.  Psychiatric/Behavioral: Negative for dysphoric mood and suicidal ideas.    Objective:  BP 134/89   Pulse (!) 102   Ht 5\' 9"  (1.753 m)   Wt 135 lb 12.8 oz (61.6 kg)   SpO2 98%   BMI 20.05 kg/m   BP/Weight 08/07/2018 07/31/2018 7/61/9509  Systolic BP 326 712 458  Diastolic BP 89 89 87  Wt. (Lbs) 135.8 139 139  BMI 20.05 20.53 20.53  Some encounter information is confidential and restricted. Go to Review Flowsheets activity to see all data.  Physical Exam  Constitutional: He is oriented to person, place, and time. He appears well-developed and well-nourished.  Cardiovascular: Normal heart sounds and intact distal pulses. Tachycardia present.  No murmur heard. Pulmonary/Chest: Effort normal and breath sounds normal. He has no wheezes. He has no rales. He exhibits no tenderness.  Abdominal: Soft. Bowel sounds are normal. He exhibits no distension and no mass. There is no tenderness.  Musculoskeletal: Normal range of motion.  Neurological: He is alert and oriented to person, place, and time.  Skin: Skin is warm and dry.     Assessment & Plan:   1. Other insomnia Stable - traZODone (DESYREL) 50 MG tablet; Take 1 tablet (50 mg total) by mouth at bedtime.  Dispense: 30 tablet; Refill: 3  2. Other emphysema ([place]) Controlled No acute exacerbation Community-acquired pneumonia has resolved - tiotropium (SPIRIVA) 18 MCG inhalation capsule; Place 1 capsule (18 mcg  total) into inhaler and inhale daily.  Dispense: 90 capsule; Refill: 1  3. Protein C deficiency ([place]) This predisposes him to thrombotic events - apixaban (ELIQUIS) 5 MG TABS tablet; Take 1 tablet (5 mg total) by mouth 2 (two) times daily.  Dispense: 180 tablet; Refill: 1  4. History of DVT (deep vein thrombosis) Will remain on lifelong anticoagulation - apixaban (ELIQUIS) 5 MG TABS tablet; Take 1 tablet (5 mg total) by mouth 2 (two) times daily.  Dispense: 180 tablet; Refill: 1  5. Essential hypertension Controlled Counseled on blood pressure goal of less than 130/80, low-sodium, DASH diet, medication compliance, 150 minutes of moderate intensity exercise per week. Discussed medication compliance, adverse effects. - lisinopril (PRINIVIL,ZESTRIL) 10 MG tablet; Take 1 tablet (10 mg total) by mouth daily.  Dispense: 90 tablet; Refill: 1 - metoprolol tartrate (LOPRESSOR) 25 MG tablet; Take 1 tablet (25 mg total) by mouth 2 (two) times daily.  Dispense: 180 tablet; Refill: 1  6. Hypokalemia Potassium was 3.3 He was previously on potassium pills We will repeat levels and supplement if indicated - Basic Metabolic Panel  7. Smokes with greater than 30 pack year history - CT CHEST LUNG CA SCREEN LOW DOSE W/O CM; Future Smoking cessation discussed including has a dose effect of continuation and he is not ready to quit at this time.  8.  Schizophrenia Stable Continue Haldol, Remeron  Meds ordered this encounter  Medications  . traZODone (DESYREL) 50 MG tablet    Sig: Take 1 tablet (50 mg total) by mouth at bedtime.    Dispense:  30 tablet    Refill:  3  . tiotropium (SPIRIVA) 18 MCG inhalation capsule    Sig: Place 1 capsule (18 mcg total) into inhaler and inhale daily.    Dispense:  90 capsule    Refill:  1  . apixaban (ELIQUIS) 5 MG TABS tablet    Sig: Take 1 tablet (5 mg total) by mouth 2 (two) times daily.    Dispense:  180 tablet    Refill:  1  . lisinopril (PRINIVIL,ZESTRIL)  10 MG tablet    Sig: Take 1 tablet (10 mg total) by mouth daily.    Dispense:  90 tablet    Refill:  1  . metoprolol tartrate (LOPRESSOR) 25 MG tablet    Sig: Take 1 tablet (25 mg total) by mouth 2 (two) times daily.    Dispense:  180 tablet    Refill:  1    Follow-up: Return in about 3 months (around 11/07/2018) for follow up of chronic medical conditions.   Naesha Buckalew  MD   

## 2018-08-08 ENCOUNTER — Other Ambulatory Visit: Payer: Self-pay | Admitting: Family Medicine

## 2018-08-08 LAB — BASIC METABOLIC PANEL
BUN/Creatinine Ratio: 15 (ref 9–20)
BUN: 14 mg/dL (ref 6–24)
CO2: 22 mmol/L (ref 20–29)
Calcium: 10.2 mg/dL (ref 8.7–10.2)
Chloride: 105 mmol/L (ref 96–106)
Creatinine, Ser: 0.93 mg/dL (ref 0.76–1.27)
GFR, EST AFRICAN AMERICAN: 105 mL/min/{1.73_m2} (ref >59)
GFR, EST NON AFRICAN AMERICAN: 91 mL/min/{1.73_m2} (ref >59)
Glucose: 97 mg/dL (ref 65–99)
POTASSIUM: 4.1 mmol/L (ref 3.5–5.2)
SODIUM: 144 mmol/L (ref 134–144)

## 2018-08-09 ENCOUNTER — Telehealth: Payer: Self-pay

## 2018-08-09 NOTE — Telephone Encounter (Signed)
Patient was called and informed of lab results. 

## 2018-08-09 NOTE — Telephone Encounter (Signed)
-----   Message from Charlott Rakes, MD sent at 08/08/2018  6:29 AM EST ----- Labs are stable

## 2018-08-15 ENCOUNTER — Other Ambulatory Visit: Payer: Self-pay | Admitting: Family Medicine

## 2018-08-15 DIAGNOSIS — I10 Essential (primary) hypertension: Secondary | ICD-10-CM

## 2018-08-19 ENCOUNTER — Ambulatory Visit (HOSPITAL_COMMUNITY): Admission: RE | Admit: 2018-08-19 | Payer: Medicare Other | Source: Ambulatory Visit

## 2018-09-04 ENCOUNTER — Other Ambulatory Visit: Payer: Self-pay | Admitting: Infectious Diseases

## 2018-09-20 ENCOUNTER — Ambulatory Visit: Payer: Self-pay | Admitting: *Deleted

## 2018-09-20 DIAGNOSIS — B20 Human immunodeficiency virus [HIV] disease: Secondary | ICD-10-CM

## 2018-10-03 DIAGNOSIS — Z79899 Other long term (current) drug therapy: Secondary | ICD-10-CM | POA: Diagnosis not present

## 2018-10-03 DIAGNOSIS — R250 Abnormal head movements: Secondary | ICD-10-CM | POA: Diagnosis not present

## 2018-10-14 ENCOUNTER — Telehealth: Payer: Self-pay | Admitting: *Deleted

## 2018-10-14 ENCOUNTER — Ambulatory Visit: Payer: Self-pay | Admitting: *Deleted

## 2018-10-14 DIAGNOSIS — Z594 Lack of adequate food and safe drinking water: Secondary | ICD-10-CM

## 2018-10-14 DIAGNOSIS — Z5941 Food insecurity: Secondary | ICD-10-CM

## 2018-10-14 NOTE — Telephone Encounter (Signed)
Called the patient to check in with him. He said he is doing well and is aware of his appt with [name] on 02/03. [name] Herring said he has transportation as well. He does state he is out of food and would like some assistance with getting food. Visit will be made today to offer assitance to [name] Herring.

## 2018-10-14 NOTE — Telephone Encounter (Signed)
Initial Assessment Points to Consider for Care             Points are not all inclusive to services and educated provided but supports the patient's Individualized Plan of Care.   Is Home safe for visits? Yes / Are all firearms or weapons secure? yes  Insurance Coverage: Medicare  1st HIV Diagnosis: Nov 1999  Mode of HIV Transmission: MSM  Functional Status: able to perform ADL's independently with additional time given  Current Housing/Needs: no housing needs at this time, lives in an apartment alone   Social Support/System: Dtr who lives in [place] in [place]) no longer sister(Barbara)  Culture/Religion/Spirituality:none noted that would create barriers to care  Educational Background:   Legal Issues: none noted  Access/Utilization of Community Resources:27% no show rate  Mental Health Concerns/Diagnosis: Schizoaffective disorder  Alcohol and/or Drug Use: Hx of cocaine abuse, current alcohol abuse  Risk and Knowledge of HIV and Reduction in Transmission:  Very knowledgeable able mode of transmission and ways to reduce transmission Nutritional Needs: not at this time    Frequency / Duration of [place] visits: Effective 10/17/18  5mo1, 68mo2, 41mo1  4PRN's for complications with disease process/progression, medication changes or concerns   CBHCN will assess for learning needs related to diagnosis and treatment regimen, provide education as needed, fill pill box if needed, address any barriers which may be preventing medication compliance, and communicating with care team including physician and case manager.   Individualized Plan Of Care with Certification Period from 10/17/18 to 01/15/19             a. Type of service(s) and care to be delivered: RN Case Management             b. Frequency and duration of service: Effective 10/17/18  49mo1, 42mo2, 50mo1, 4 prns for complications with disease process/progression, medication changes or concerns . Visits/Contact may be  conducted   telephonically or in person to best suit the patient.             c. Activity restrictions: Pt may be up as tolerated and can safely ambulate without the need for a assistive device. Additional time does need to be given to complete task              d. Safety Measures: Standard Precautions/Infection Control              e. Service Objectives and Goals: Service Objectives are to assist the pt with HIV medication regimen adherence and staying in care with the Infectious            Disease Clinic by identifying barriers to care. RN will address the     barriers that are identified by the patient. Current the patient is capable to taking his own medications but states he would like help and reminders with his care. Patient states he is struggling with alcoholism and thoughts of abusing drugs and would like to have a support person to engage with him. Declines rehab at this time               f. Equipment required: No additional equipment needs at this time              g. Functional Limitations: Vision. Pt has corrective glasses that he wears              h. Rehabilitation potential: Guarded              i. Diet and  Nutritional Needs: Regular Diet              j. Medications and treatments: Medications have been reconciled and reviewed and are a part of EPIC electronic file              k. Specific therapies if needed: RN              l. Pertinent diagnoses: HIV disease,  Hx of medication NonCompliance, Schizoaffective disorder             m. Expected outcome: Guarded

## 2018-10-21 NOTE — Telephone Encounter (Signed)
I approve of these orders for Mr. Nishi.

## 2018-10-25 NOTE — Progress Notes (Signed)
Home visit made with Mr Guidroz today, we readdressed his goals and medication adherence. Mr Wander states he has been having some financial concerns but his medication adherence has not been affected. His family continues to be supportive and [facility] pharmacy delivers his medication each mouth without any issues. He denies falling or having any concerns with is health. Today we still reviewed fall prevention and risk reduction. Mr Mestre denies condoms at this time. Reviewed with Mr Darley his upcoming appts and he confirms that he is aware and transportation is not a concern for him at this time. Explained to Mr Haran that I will continue to reach out to him but if he needs me to please give me a call. Mr Wachsmuth verbalized understanding.

## 2018-10-25 NOTE — Progress Notes (Signed)
Contacted Mr Kosta to assess his needs and medication adherence. Mr Karen states he continues to take his medications as ordered but he is completely out of food and needs assistance. Currently [facility]'s food pantry is low so I was able to use funding provided by [employer]. Traveled to [employer] to purchase food until Mr Klaiber' food stamps arrive. Food delivered to Mr Mirarchi at this time. Focus is, Mr Lenderman cannot take his medications if his focus is food insecurities not to mention his medications are most effective when taken with food.

## 2018-10-28 ENCOUNTER — Ambulatory Visit: Payer: Medicare Other | Admitting: Infectious Diseases

## 2018-11-08 ENCOUNTER — Encounter: Payer: Self-pay | Admitting: *Deleted

## 2018-11-11 ENCOUNTER — Ambulatory Visit: Payer: Self-pay | Admitting: Family Medicine

## 2018-11-22 ENCOUNTER — Encounter: Payer: Self-pay | Admitting: *Deleted

## 2018-11-29 ENCOUNTER — Other Ambulatory Visit: Payer: Self-pay | Admitting: Family Medicine

## 2018-11-29 DIAGNOSIS — I10 Essential (primary) hypertension: Secondary | ICD-10-CM

## 2018-12-02 ENCOUNTER — Ambulatory Visit: Payer: Medicare Other | Admitting: Infectious Diseases

## 2018-12-06 ENCOUNTER — Encounter: Payer: Self-pay | Admitting: *Deleted

## 2018-12-09 ENCOUNTER — Ambulatory Visit: Payer: Self-pay | Admitting: Family Medicine

## 2018-12-24 ENCOUNTER — Encounter: Payer: Self-pay | Admitting: *Deleted

## 2019-01-15 ENCOUNTER — Other Ambulatory Visit: Payer: Self-pay | Admitting: Behavioral Health

## 2019-01-15 MED ORDER — BICTEGRAVIR-EMTRICITAB-TENOFOV 50-200-25 MG PO TABS: ORAL_TABLET | ORAL | 0 refills | Status: DC

## 2019-01-16 ENCOUNTER — Telehealth: Payer: Self-pay | Admitting: *Deleted

## 2019-01-16 NOTE — Telephone Encounter (Signed)
I approve of these orders.

## 2019-01-16 NOTE — Telephone Encounter (Signed)
Initial Assessment Points to Consider for Care             Points are not all inclusive to services and educated provided but supports the patient's Individualized Plan of Care.   Is Home safe for visits? Yes / Are all firearms or weapons secure? yes  Insurance Coverage: Medicare  1st HIV Diagnosis: Nov 1999  Mode of HIV Transmission: MSM  Functional Status: able to perform ADL's independently with additional time given  Current Housing/Needs: no housing needs at this time, lives in an apartment alone   Social Support/System: Dtr who lives in [place] in [place]) no longer sister(Barbara)  Culture/Religion/Spirituality:none noted that would create barriers to care  Educational Background:   Legal Issues: none noted  Access/Utilization of Community Resources:27% no show rate  Mental Health Concerns/Diagnosis: Schizoaffective disorder  Alcohol and/or Drug Use: Hx of cocaine abuse, current alcohol abuse  Risk and Knowledge of HIV and Reduction in Transmission:  Very knowledgeable able mode of transmission and ways to reduce transmission Nutritional Needs: not at this time    Frequency / Duration of [place] visits: Effective 01/16/19  74mo4  4PRN's for complications with disease process/progression, medication changes or concerns   CBHCN will assess for learning needs related to diagnosis and treatment regimen, provide education as needed, fill pill box if needed, address any barriers which may be preventing medication compliance, and communicating with care team including physician and case manager.   Individualized Plan Of Care with Certification Period from 01/16/19 to 04/16/19             a. Type of service(s) and care to be delivered: RN Case Management             b. Frequency and duration of service: Effective 01/16/19  52mo4, 4 prns for complications with disease process/progression, medication changes or concerns . Visits/Contact may be conducted   telephonically  or in person to best suit the patient.             c. Activity restrictions: Pt may be up as tolerated and can safely ambulate without the need for a assistive device. Additional time does need to be given to complete task              d. Safety Measures: Standard Precautions/Infection Control              e. Service Objectives and Goals: Service Objectives are to assist the pt with HIV medication regimen adherence and staying in care with the Infectious Disease Clinic by identifying barriers to care. RN will address the barriers that are identified by the patient. Current the patient is capable to taking his own medications but states he would like help and reminders with his care. Patient states he is struggling with alcoholism and thoughts of abusing drugs and would like to have a support person to engage with him. Declines rehab at this time               f. Equipment required: No additional equipment needs at this time              g. Functional Limitations: Vision. Pt has corrective glasses that he wears              h. Rehabilitation potential: Guarded              i. Diet and Nutritional Needs: Regular Diet              j. Medications  and treatments: Medications have been reconciled and reviewed and are a part of EPIC electronic file              k. Specific therapies if needed: RN              l. Pertinent diagnoses: HIV disease,  Hx of medication NonCompliance, Schizoaffective disorder             m. Expected outcome: Guarded

## 2019-01-20 ENCOUNTER — Telehealth: Payer: Self-pay | Admitting: *Deleted

## 2019-01-20 NOTE — Telephone Encounter (Signed)
Contacted Mr Kimberley who has an appt with his PCP at 3:30 and then our clinic at 4pm. The offices are about 500 feet from each other. Mr Basnett stated the bus system has changed and he cannot rely on it for his appointments. I will arrange transportation to have him at our office by 3pm, Mr Cerullo will walk to [facility] for his 3:30 appt and then walk back to our office for his 4pm appt. He may be a little late for his [facility] appt.

## 2019-01-21 ENCOUNTER — Telehealth: Payer: Self-pay | Admitting: Infectious Diseases

## 2019-01-21 NOTE — Telephone Encounter (Signed)
COVID-19 Pre-Screening Questions: ° °Do you currently have a fever (>100 °F), chills or unexplained body aches? No  ° °Are you currently experiencing new cough, shortness of breath, sore throat, runny nose? No  °•  °Have you recently travelled outside the state of [place] in the last 14 days? No  °•  °Have you been in contact with someone that is currently pending confirmation of Covid19 testing or has been confirmed to have the Covid19 virus?  No  °

## 2019-01-22 ENCOUNTER — Other Ambulatory Visit: Payer: Self-pay

## 2019-01-22 ENCOUNTER — Ambulatory Visit: Payer: Medicare Other | Attending: Family Medicine | Admitting: Family Medicine

## 2019-01-22 ENCOUNTER — Encounter: Payer: Self-pay | Admitting: Infectious Diseases

## 2019-01-22 ENCOUNTER — Encounter: Payer: Self-pay | Admitting: Family Medicine

## 2019-01-22 ENCOUNTER — Ambulatory Visit (INDEPENDENT_AMBULATORY_CARE_PROVIDER_SITE_OTHER): Payer: Medicare Other | Admitting: Infectious Diseases

## 2019-01-22 VITALS — BP 158/109 | HR 73 | Temp 98.3°F | Wt 135.0 lb

## 2019-01-22 DIAGNOSIS — K2100 Gastro-esophageal reflux disease with esophagitis, without bleeding: Secondary | ICD-10-CM | POA: Insufficient documentation

## 2019-01-22 DIAGNOSIS — D6859 Other primary thrombophilia: Secondary | ICD-10-CM

## 2019-01-22 DIAGNOSIS — Z79899 Other long term (current) drug therapy: Secondary | ICD-10-CM | POA: Diagnosis not present

## 2019-01-22 DIAGNOSIS — I10 Essential (primary) hypertension: Secondary | ICD-10-CM | POA: Diagnosis not present

## 2019-01-22 DIAGNOSIS — Z113 Encounter for screening for infections with a predominantly sexual mode of transmission: Secondary | ICD-10-CM | POA: Diagnosis not present

## 2019-01-22 DIAGNOSIS — F1021 Alcohol dependence, in remission: Secondary | ICD-10-CM

## 2019-01-22 DIAGNOSIS — F200 Paranoid schizophrenia: Secondary | ICD-10-CM

## 2019-01-22 DIAGNOSIS — Z720 Tobacco use: Secondary | ICD-10-CM | POA: Diagnosis not present

## 2019-01-22 DIAGNOSIS — J438 Other emphysema: Secondary | ICD-10-CM | POA: Diagnosis not present

## 2019-01-22 DIAGNOSIS — B20 Human immunodeficiency virus [HIV] disease: Secondary | ICD-10-CM

## 2019-01-22 DIAGNOSIS — Z86718 Personal history of other venous thrombosis and embolism: Secondary | ICD-10-CM | POA: Diagnosis not present

## 2019-01-22 MED ORDER — PANTOPRAZOLE SODIUM 20 MG PO TBEC: 20.0000 mg | DELAYED_RELEASE_TABLET | Freq: Every day | ORAL | 3 refills | Status: DC

## 2019-01-22 MED ORDER — APIXABAN 5 MG PO TABS: 5.0000 mg | ORAL_TABLET | Freq: Two times a day (BID) | ORAL | 1 refills | Status: DC

## 2019-01-22 MED ORDER — TIOTROPIUM BROMIDE MONOHYDRATE 18 MCG IN CAPS: 18.0000 ug | ORAL_CAPSULE | Freq: Every day | RESPIRATORY_TRACT | 1 refills | Status: DC

## 2019-01-22 MED ORDER — UMECLIDINIUM BROMIDE 62.5 MCG/INH IN AEPB: 1.0000 | INHALATION_SPRAY | Freq: Every day | RESPIRATORY_TRACT | 1 refills | Status: DC

## 2019-01-22 MED ORDER — LISINOPRIL 10 MG PO TABS: 10.0000 mg | ORAL_TABLET | Freq: Every day | ORAL | 1 refills | Status: DC

## 2019-01-22 NOTE — Assessment & Plan Note (Signed)
Counseled today to reduce to eliminate.

## 2019-01-22 NOTE — Assessment & Plan Note (Signed)
Symptoms worse at night and consistent with reflux. He has a h/o duodenal ulcers in the past. Previously negative for h pylori. Will resume pantoprazole 33min before dinner to control symptoms. We spent a lot of time discussing dietary/lifestyle changes he should look to consider. He drinks 2 pots of coffee daily, eats a lot of spicy foods and smokes. He was accepting of the information and will start making changes. Will have him trial PPI for 8 weeks.

## 2019-01-22 NOTE — Progress Notes (Signed)
Patient Name: [name] Herring  Date of Birth: [birth year]  MRN: [id number]  PCP: Charlott Rakes, MD   Patient Active Problem List   Diagnosis Date Noted  . GERD with esophagitis 01/22/2019  . Protein C deficiency ([place]) 07/05/2017  . Benign neoplasm of transverse colon   . Unintentional weight loss 06/14/2017  . Chest pain on exertion 06/12/2017  . Emphysema lung ([place]) 11/20/2016  . DVT of lower extremity, bilateral ([place]) 11/04/2016  . Tobacco abuse 11/02/2016  . PE (pulmonary thromboembolism) ([place]) 11/02/2016  . Cocaine dependence with cocaine-induced mood disorder ([place]) 03/29/2016  . HTN (hypertension) 08/25/2015  . Paranoid schizophrenia, chronic condition ([place]) 12/30/2014  . Alcohol use disorder, severe, in early remission ([place]) 12/27/2014  . Human immunodeficiency virus (HIV) disease ([place]) 11/09/2006  . CA IN SITU, RECTUM 11/09/2006  . Hypothyroidism 11/09/2006  . DISORDER, BIPOLAR NOS 11/09/2006  . Depression 11/09/2006    SUBJECTIVE:  Brief Narrative:  [name] Herring is a 58 y.o. AA male with a history of HIV disease Dx 08/18/1998. Previously followed at [facility].  HIV Risk: MSM.  History of OIs: uncertain. History of ETOH/cocaine abuse, previously homeless.   Previous Regimens:   Kaletra + Combivir   Genvoya > stopped d/t DDI  Biktarvy 2019    CC: Follow up HIV care.  Acid reflux is getting real bad at night.   HPI: [name] Herring is doing very well.  He tells me he is quit drinking altogether and is now trying to encourage his son to do the same.  He feels amazing.  He has not had this kind of energy in years.  He has not regained much weight but he is happy to see that he has not lost any either.  He does continue to take his Biktarvy every day.  He has no concerns with any intolerances, side effects or access to his medications.  He likes his pill very much.  No new sexual partners.  In fact he tells me he has not had a sexual encounter for 15 years.   Currently living in his apartment and working with our Museum/gallery curator.  He absolutely loves her.  Primary concern is increased frequency of heartburn.  He tells me that this happens mostly at night.  Feels like he is having a heart attack with his chest pain.  No significant change in the cough although he has a chronic wound associated with cigarette use.  He does continue to smoke 1/2-1 full pack per day of cigarettes.  He drinks 2 pots of coffee a day.  Eats spicy foods frequently.  He had a telephone visit with his primary care provider today and is in care with her regularly.  His blood pressure is little bit elevated today.  Review of Systems  Constitutional: Negative for chills, fever, malaise/fatigue and weight loss.  HENT: Negative for sore throat.        No dental problems  Respiratory: Negative for cough and sputum production.   Cardiovascular: Negative for chest pain and leg swelling.  Gastrointestinal: Positive for heartburn and vomiting (infrequent). Negative for abdominal pain and diarrhea.  Genitourinary: Negative for dysuria and flank pain.  Musculoskeletal: Negative for joint pain, myalgias and neck pain.  Skin: Negative for rash.  Neurological: Negative for dizziness, tingling and headaches.  Psychiatric/Behavioral: Negative for depression and substance abuse. The patient is not nervous/anxious and does not have insomnia.    Past Medical History:  Diagnosis Date  . Depression   .  HIV (human immunodeficiency virus infection) ([place])   . Hypertension   . Immune deficiency disorder ([place])   . PE (pulmonary thromboembolism) ([place]) 2018  . Personality disorder ([place])   . Schizophrenia [facility])    Outpatient Medications Prior to Visit  Medication Sig Dispense Refill  . acetaminophen (TYLENOL) 500 MG tablet Take 1 tablet (500 mg total) by mouth every 6 (six) hours as needed for moderate pain. 30 tablet 0  . apixaban (ELIQUIS) 5 MG TABS tablet Take 1 tablet (5 mg  total) by mouth 2 (two) times daily. 180 tablet 1  . benztropine (COGENTIN) 1 MG tablet 1 tab in the morning 2 tabs at night (Patient taking differently: Take 1-2 mg by mouth See admin instructions. Take 1 tablet in the morning and take 2 tablets at night) 14 tablet 0  . bictegravir-emtricitabine-tenofovir AF (BIKTARVY) 50-200-25 MG TABS tablet TAKE ONE TABLET BY MOUTH DAILY. TRY TO TAKE AT THE SAME TIME EACH DAY WITH OR WITHOUT FOOD. 30 tablet 0  . butalbital-acetaminophen-caffeine (ESGIC) 50-325-40 MG tablet Take 1 tablet by mouth 2 (two) times daily as needed for headache. 30 tablet 0  . cetirizine (ZYRTEC) 10 MG tablet Take 1 tablet (10 mg total) by mouth daily. 30 tablet 1  . gabapentin (NEURONTIN) 600 MG tablet Take 600 mg by mouth at bedtime.    . haloperidol (HALDOL) 10 MG tablet Take 0.5 tablets (5 mg total) by mouth 2 (two) times daily.    Marland Kitchen lisinopril (ZESTRIL) 10 MG tablet Take 1 tablet (10 mg total) by mouth daily. 90 tablet 1  . magnesium oxide (MAG-OX) 400 (241.3 Mg) MG tablet Take 1 tablet (400 mg total) by mouth 2 (two) times daily. 60 tablet 0  . metoprolol tartrate (LOPRESSOR) 25 MG tablet Take 1 tablet (25 mg total) by mouth 2 (two) times daily. 180 tablet 1  . mirtazapine (REMERON) 30 MG tablet Take 1 tablet (30 mg total) by mouth at bedtime. 30 tablet 3  . Multiple Vitamin (MULTIVITAMIN WITH MINERALS) TABS tablet Take 1 tablet by mouth daily.    . polyethylene glycol (MIRALAX / GLYCOLAX) packet Take 17 g by mouth daily.    Marland Kitchen tiotropium (SPIRIVA) 18 MCG inhalation capsule Place 1 capsule (18 mcg total) into inhaler and inhale daily. 90 capsule 1  . traZODone (DESYREL) 50 MG tablet Take 1 tablet (50 mg total) by mouth at bedtime. 30 tablet 3  . umeclidinium bromide (INCRUSE ELLIPTA) 62.5 MCG/INH AEPB Inhale 1 puff into the lungs daily. 90 each 1  . Valbenazine Tosylate (INGREZZA) 80 MG CAPS Take 80 mg by mouth 2 (two) times daily.     . pantoprazole (PROTONIX) 40 MG tablet Take 1  tablet (40 mg total) by mouth 2 (two) times daily before a meal. 60 tablet 1   No facility-administered medications prior to visit.    Allergies  Allergen Reactions  . Amoxicillin Shortness Of Breath and Rash    Has patient had a PCN reaction causing immediate rash, facial/tongue/throat swelling, [name] Has patient had a PCN reaction causing severe rash involving mucus membranes or skin necrosis: no Has patient had a PCN reaction that required hospitalization: yes drs office visit Has patient had a PCN reaction occurring within the last 10 years: no If all of the above answers are "NO", then may proceed with Cephalosporin use.   . Latex Shortness Of Breath  . Abilify [Aripiprazole] Other (See Comments)    Double vision   Social History  Tobacco Use  . Smoking status: Current Every Day Smoker    Packs/day: 1.00    Years: 33.00    Pack years: 33.00    Types: Cigarettes  . Smokeless tobacco: Never Used  . Tobacco comment: cutting back  Substance Use Topics  . Alcohol use: Yes    Alcohol/week: 3.0 standard drinks    Types: 3 Shots of liquor per week    Comment: per patient drinks one pint a month 06/20/17  . Drug use: Yes    Comment: crack    Objective:  Vitals:   01/22/19 1549  BP: (!) 158/109  Pulse: 73  Temp: 98.3 F (36.8 C)  Weight: 135 lb (61.2 kg)   Body mass index is 19.94 kg/m.  Physical Exam  Constitutional: He is oriented to person, place, and time.  Thin, chronically ill appearing male older than stated age.  Eyes: Pupils are equal, round, and reactive to light. No scleral icterus.  Cardiovascular: Normal rate and regular rhythm.  No murmur heard. Pulmonary/Chest: Effort normal. No respiratory distress.  Coarse rhonchi in upper airway. Dry cough.   Abdominal: Soft. Bowel sounds are normal. He exhibits no distension. There is no abdominal tenderness. There is no rebound and no guarding.  Musculoskeletal: Normal  range of motion.  Lymphadenopathy:    He has no cervical adenopathy.  Neurological: He is alert and oriented to person, place, and time.  Skin: Skin is warm.  Psychiatric: Mood, memory and affect normal.  Vitals reviewed.  Lab Results Lab Results  Component Value Date   WBC 5.7 07/31/2018   HGB 12.6 (L) 07/31/2018   HCT 39.9 07/31/2018   MCV 91.9 07/31/2018   PLT 128 (L) 07/31/2018    Lab Results  Component Value Date   CREATININE 0.93 08/07/2018   BUN 14 08/07/2018   NA 144 08/07/2018   K 4.1 08/07/2018   CL 105 08/07/2018   CO2 22 08/07/2018    Lab Results  Component Value Date   ALT 18 07/31/2018   AST 26 07/31/2018   ALKPHOS 48 07/31/2018   BILITOT 0.6 07/31/2018    Lab Results  Component Value Date   CHOL 140 06/17/2018   HDL 71 06/17/2018   LDLCALC 49 06/17/2018   TRIG 118 06/17/2018   CHOLHDL 2.0 06/17/2018   HIV 1 RNA Quant (copies/mL)  Date Value  06/17/2018 <20 DETECTED (A)  10/24/2017 26 (H)  09/12/2017 35 (H)   CD4 T Cell Abs (/uL)  Date Value  06/17/2018 250 (L)  10/24/2017 290 (L)  09/12/2017 240 (L)   Lab Results  Component Value Date   HAV REACTIVE (A) 06/17/2018   Lab Results  Component Value Date   HEPBSAG NON-REACTIVE 06/17/2018   HEPBSAB BORDERLINE (A) 06/17/2018   Lab Results  Component Value Date   HCVAB Negative 09/11/2007   Lab Results  Component Value Date   CHLAMYDIAWP Negative 01/13/2015   N Negative 01/13/2015   ASSESSMENT & PLAN:   Problem List Items Addressed This Visit      Unprioritized   Human immunodeficiency virus (HIV) disease ([place]) - Primary (Chronic)    Johneric looks great today. Reviewed his last set of labs with him. Will try to get him signed up for MyChart so he can stay engaged in care - he is very interested in this today. May see if [name] can help him if he is unable to connect.  Will continue his Biktarvy once daily. No findings concerning for advancing HIV disease  or OIs. Defer STI screening as  he is not sexually active.  Return in about 4 months (around 05/24/2019) for follow up, labs.       Relevant Orders   HIV-1 RNA quant-no reflex-bld   T-helper cell (CD4)- ([facility] clinic only)   CBC with Differential/Platelet   COMPLETE METABOLIC PANEL WITH GFR   Alcohol use disorder, severe, in early remission [facility])    He has quit drinking and says he is helping his son try to achieve the same. I congratulated and praised him today. Encouraged him to continue this good effort.       Paranoid schizophrenia, chronic condition ([place])    Periodically has auditory hallucinations but he can differentiate the difference and knows they are not real. He is currently of good mind set and is the best he has looked in a while. His PCP is treating his schizophrenia.       Tobacco abuse    Counseled today to reduce to eliminate.       GERD with esophagitis    Symptoms worse at night and consistent with reflux. He has a h/o duodenal ulcers in the past. Previously negative for h pylori. Will resume pantoprazole 69min before dinner to control symptoms. We spent a lot of time discussing dietary/lifestyle changes he should look to consider. He drinks 2 pots of coffee daily, eats a lot of spicy foods and smokes. He was accepting of the information and will start making changes. Will have him trial PPI for 8 weeks.        Other Visit Diagnoses    Gastroesophageal reflux disease with esophagitis       Routine screening for STI (sexually transmitted infection)       Relevant Orders   RPR   High risk medication use       Relevant Orders   Lipid panel     Meds ordered this encounter  Medications  . pantoprazole (PROTONIX) 20 MG tablet    Sig: Take 1 tablet (20 mg total) by mouth daily at 6 PM. Take 30 minutes before dinner on an empty stomach.    Dispense:  30 tablet    Refill:  3    Order Specific Question:   Supervising Provider    Answer:   Thayer Headings [[id number]]   Return in about 4 months (around  05/24/2019) for follow up, labs.   Janene Madeira, MSN, NP-C [facility] [place] Pager: [phone]  01/22/19 1:51 PM

## 2019-01-22 NOTE — Assessment & Plan Note (Signed)
He has quit drinking and says he is helping his son try to achieve the same. I congratulated and praised him today. Encouraged him to continue this good effort.

## 2019-01-22 NOTE — Assessment & Plan Note (Signed)
[name] Herring looks great today. Reviewed his last set of labs with him. Will try to get him signed up for MyChart so he can stay engaged in care - he is very interested in this today. May see if [name] can help him if he is unable to connect.  Will continue his Biktarvy once daily. No findings concerning for advancing HIV disease or OIs. Defer STI screening as he is not sexually active.  Return in about 4 months (around 05/24/2019) for follow up, labs.

## 2019-01-22 NOTE — Assessment & Plan Note (Signed)
Periodically has auditory hallucinations but he can differentiate the difference and knows they are not real. He is currently of good mind set and is the best he has looked in a while. His PCP is treating his schizophrenia.

## 2019-01-22 NOTE — Progress Notes (Signed)
Patient has been called and DOB has been verified. Patient has been screened and transferred to PCP to start phone visit.  C/C: Hypertension    

## 2019-01-22 NOTE — Progress Notes (Signed)
Virtual Visit via Telephone Note  I connected with [name] Herring, on 01/22/2019 at 3:37 PM by telephone and verified that I am speaking with the correct person using two identifiers.   Consent: I discussed the limitations, risks, security and privacy concerns of performing an evaluation and management service by telephone and the availability of in person appointments. I also discussed with the patient that there may be a patient responsible charge related to this service. The patient expressed understanding and agreed to proceed.   Location of Patient: Infectious disease clinic  Location of Provider: Clinic   Persons participating in Telemedicine visit: [name] Herring Dr. Felecia Shelling     History of Present Illness: [name] Herring  Is a 58 year old male with a history of hypertension, paranoid schizophrenia, HIV ( currently on antiretroviral therapy), bilateral submassive pulmonary embolism, acute DVT of branch of the R Gastrocnemius vein, left lower extremity acute mobile thrombus of the proximal popliteal vein,acute DVT of the  distal to mid posterior tibial vein with right heart strain, placement of IVC filter in 10/2016, protein C deficiency, previous GI bleed secondary to duodenal ulcer here for follow-up visit. He has been out of his antihypertensive and is requesting a refill today. Doing well on Eliquis and denies bruising or bleeding and has no hematuria or hematochezia. He receives his medications for Schizophrenia from [facility] and he informs me he sometimes gets depressed but then he prays and feels better.  He describes his condition as fair.  On discussing psychotherapy he is open to undergoing this as he currently does not.  Denies suicidal ideation or intent but endorses visual and auditory hallucinations which improve when he takes his psychotropic medications  With regards to his HIV he is currently at the infectious disease clinic  where he is scheduled for an office visit in the next few minutes.   Past Medical History:  Diagnosis Date  . Depression   . HIV (human immunodeficiency virus infection) ([place])   . Hypertension   . Immune deficiency disorder ([place])   . PE (pulmonary thromboembolism) ([place]) 2018  . Personality disorder ([place])   . Schizophrenia ([place])    Allergies  Allergen Reactions  . Amoxicillin Shortness Of Breath and Rash    Has patient had a PCN reaction causing immediate rash, facial/tongue/throat swelling, [name] Has patient had a PCN reaction causing severe rash involving mucus membranes or skin necrosis: no Has patient had a PCN reaction that required hospitalization: yes drs office visit Has patient had a PCN reaction occurring within the last 10 years: no If all of the above answers are "NO", then may proceed with Cephalosporin use.   . Latex Shortness Of Breath  . Abilify [Aripiprazole] Other (See Comments)    Double vision    Current Outpatient Medications on File Prior to Visit  Medication Sig Dispense Refill  . acetaminophen (TYLENOL) 500 MG tablet Take 1 tablet (500 mg total) by mouth every 6 (six) hours as needed for moderate pain. 30 tablet 0  . apixaban (ELIQUIS) 5 MG TABS tablet Take 1 tablet (5 mg total) by mouth 2 (two) times daily. 180 tablet 1  . benztropine (COGENTIN) 1 MG tablet 1 tab in the morning 2 tabs at night (Patient taking differently: Take 1-2 mg by mouth See admin instructions. Take 1 tablet in the morning and take 2 tablets at night) 14 tablet 0  . bictegravir-emtricitabine-tenofovir AF (BIKTARVY) 50-200-25 MG TABS tablet  TAKE ONE TABLET BY MOUTH DAILY. TRY TO TAKE AT THE SAME TIME EACH DAY WITH OR WITHOUT FOOD. 30 tablet 0  . butalbital-acetaminophen-caffeine (ESGIC) 50-325-40 MG tablet Take 1 tablet by mouth 2 (two) times daily as needed for headache. 30 tablet 0  . cetirizine (ZYRTEC) 10 MG tablet Take 1 tablet (10 mg total) by  mouth daily. 30 tablet 1  . gabapentin (NEURONTIN) 600 MG tablet Take 600 mg by mouth at bedtime.    . haloperidol (HALDOL) 10 MG tablet Take 0.5 tablets (5 mg total) by mouth 2 (two) times daily.    . INCRUSE ELLIPTA 62.5 MCG/INH AEPB INHALE ONE PUFF INTO THE LUNGS DAILY 30 each 2  . lisinopril (PRINIVIL,ZESTRIL) 10 MG tablet TAKE ONE TABLET BY MOUTH DAILY 90 tablet 0  . magnesium oxide (MAG-OX) 400 (241.3 Mg) MG tablet Take 1 tablet (400 mg total) by mouth 2 (two) times daily. 60 tablet 0  . metoprolol tartrate (LOPRESSOR) 25 MG tablet Take 1 tablet (25 mg total) by mouth 2 (two) times daily. 180 tablet 1  . mirtazapine (REMERON) 30 MG tablet Take 1 tablet (30 mg total) by mouth at bedtime. 30 tablet 3  . Multiple Vitamin (MULTIVITAMIN WITH MINERALS) TABS tablet Take 1 tablet by mouth daily.    . pantoprazole (PROTONIX) 40 MG tablet Take 1 tablet (40 mg total) by mouth 2 (two) times daily before a meal. 60 tablet 1  . polyethylene glycol (MIRALAX / GLYCOLAX) packet Take 17 g by mouth daily.    Marland Kitchen tiotropium (SPIRIVA) 18 MCG inhalation capsule Place 1 capsule (18 mcg total) into inhaler and inhale daily. 90 capsule 1  . traZODone (DESYREL) 50 MG tablet Take 1 tablet (50 mg total) by mouth at bedtime. 30 tablet 3  . Valbenazine Tosylate (INGREZZA) 80 MG CAPS Take 80 mg by mouth 2 (two) times daily.      No current facility-administered medications on file prior to visit.     Observations/Objective: Awake, alert, oriented x3 Not in acute distress  CMP Latest Ref Rng & Units 08/07/2018 07/31/2018 06/17/2018  Glucose 65 - 99 mg/dL 97 82 85  BUN 6 - 24 mg/dL 14 15 14   Creatinine 0.76 - 1.27 mg/dL 0.93 1.00 0.94  Sodium 134 - 144 mmol/L 144 142 139  Potassium 3.5 - 5.2 mmol/L 4.1 3.2(L) 3.8  Chloride 96 - 106 mmol/L 105 109 109  CO2 20 - 29 mmol/L 22 26 26   Calcium 8.7 - 10.2 mg/dL 10.2 9.4 9.8  Total Protein 6.5 - 8.1 g/dL - 6.1(L) 7.1  Total Bilirubin 0.3 - 1.2 mg/dL - 0.6 0.3  Alkaline  Phos 38 - 126 U/L - 48 -  AST 15 - 41 U/L - 26 13  ALT 0 - 44 U/L - 18 9    Lipid Panel     Component Value Date/Time   CHOL 140 06/17/2018 1202   TRIG 118 06/17/2018 1202   HDL 71 06/17/2018 1202   CHOLHDL 2.0 06/17/2018 1202   VLDL 30 08/05/2015 1503   LDLCALC 49 06/17/2018 1202    CBC    Component Value Date/Time   WBC 5.7 07/31/2018 0429   RBC 4.34 07/31/2018 0429   HGB 12.6 (L) 07/31/2018 0429   HGB 10.0 (L) 06/29/2017 1443   HCT 39.9 07/31/2018 0429   HCT 32.0 (L) 06/29/2017 1443   PLT 128 (L) 07/31/2018 0429   PLT 225 06/29/2017 1443   MCV 91.9 07/31/2018 0429   MCV 93 06/29/2017 1443  MCH 29.0 07/31/2018 0429   MCHC 31.6 07/31/2018 0429   RDW 18.5 (H) 07/31/2018 0429   RDW 18.7 (H) 06/29/2017 1443   LYMPHSABS 1,006 06/17/2018 1202   LYMPHSABS 1.6 06/29/2017 1443   MONOABS 0.4 10/29/2017 0137   EOSABS 19 06/17/2018 1202   EOSABS 0.1 06/29/2017 1443   BASOSABS 19 06/17/2018 1202   BASOSABS 0.0 06/29/2017 1443     Assessment and Plan: 1. Other emphysema ([place]) No recent exacerbations - tiotropium (SPIRIVA) 18 MCG inhalation capsule; Place 1 capsule (18 mcg total) into inhaler and inhale daily.  Dispense: 90 capsule; Refill: 1  2. Essential hypertension BP was controlled at 134/89 at last visit Continue current regimen Counseled on blood pressure goal of less than 130/80, low-sodium, DASH diet, medication compliance, 150 minutes of moderate intensity exercise per week. Discussed medication compliance, adverse effects. - lisinopril (ZESTRIL) 10 MG tablet; Take 1 tablet (10 mg total) by mouth daily.  Dispense: 90 tablet; Refill: 1  3. Protein C deficiency ([place]) Currently on lifelong anticoagulation - apixaban (ELIQUIS) 5 MG TABS tablet; Take 1 tablet (5 mg total) by mouth 2 (two) times daily.  Dispense: 180 tablet; Refill: 1  4. History of DVT (deep vein thrombosis) See #3 - apixaban (ELIQUIS) 5 MG TABS tablet; Take 1 tablet (5 mg total) by mouth 2 (two)  times daily.  Dispense: 180 tablet; Refill: 1  5. Human immunodeficiency virus (HIV) disease ([place]) On antiretroviral therapy Managed by infectious disease  6. Paranoid schizophrenia, chronic condition ([place]) Medication management performed via psychiatry team with the [employer] He is interested in psychotherapy at this time hence I will refer him - Ambulatory referral to Psychiatry   Follow Up Instructions: Return in about 3 months (around 04/23/2019).    I discussed the assessment and treatment plan with the patient. The patient was provided an opportunity to ask questions and all were answered. The patient agreed with the plan and demonstrated an understanding of the instructions.   The patient was advised to call back or seek an in-person evaluation if the symptoms worsen or if the condition fails to improve as anticipated.     I provided 26 minutes total of non-face-to-face time during this encounter including median intraservice time, reviewing previous notes, labs, imaging, medications and explaining diagnosis and management.     Charlott Rakes, MD, FAAFP. [facility] [place], [place]   01/22/2019, 3:37 PM

## 2019-01-22 NOTE — Patient Instructions (Addendum)
Always a pleasure to see you [name] Herring.  You look very good and I am very proud of you.  Your viral load was undetectable back in September and your CD4 count was 250.  We will repeat these today.  Please continue your Biktarvy 1 pill once a day.  Is working very well for you.  For your reflux I will send in a prescription acid reducing medication.  Please take this 30 minutes before dinner on an empty stomach.  This will hopefully help with reflux symptoms at night.  We may need to increase the dose for you.  Please see the recommendations below about changes you can make to your diet to help improve your symptoms.  If you develop any abdominal pain or vomiting please let me know we will get you to see a gastroenterologist.  Please come back to see me again in 4 months.   Gastroesophageal Reflux Disease, Adult Gastroesophageal reflux (GER) happens when acid from the stomach flows up into the tube that connects the mouth and the stomach (esophagus). Normally, food travels down the esophagus and stays in the stomach to be digested. With GER, food and stomach acid sometimes move back up into the esophagus. You may have a disease called gastroesophageal reflux disease (GERD) if the reflux:  Happens often.  Causes frequent or very bad symptoms.  Causes problems such as damage to the esophagus. When this happens, the esophagus becomes sore and swollen (inflamed). Over time, GERD can make small holes (ulcers) in the lining of the esophagus. What are the causes? This condition is caused by a problem with the muscle between the esophagus and the stomach. When this muscle is weak or not normal, it does not close properly to keep food and acid from coming back up from the stomach. The muscle can be weak because of:  Tobacco use.  Pregnancy.  Having a certain type of hernia (hiatal hernia).  Alcohol use.  Certain foods and drinks, such as coffee, chocolate, onions, and peppermint. What increases the  risk? You are more likely to develop this condition if you:  Are overweight.  Have a disease that affects your connective tissue.  Use NSAID medicines. What are the signs or symptoms? Symptoms of this condition include:  Heartburn.  Difficult or painful swallowing.  The feeling of having a lump in the throat.  A bitter taste in the mouth.  Bad breath.  Having a lot of saliva.  Having an upset or bloated stomach.  Belching.  Chest pain. Different conditions can cause chest pain. Make sure you see your doctor if you have chest pain.  Shortness of breath or noisy breathing (wheezing).  Ongoing (chronic) cough or a cough at night.  Wearing away of the surface of teeth (tooth enamel).  Weight loss. How is this treated? Treatment will depend on how bad your symptoms are. Your doctor may suggest:  Changes to your diet.  Medicine.  Surgery. Follow these instructions at home: Eating and drinking   Follow a diet as told by your doctor. You may need to avoid foods and drinks such as: ? Coffee and tea (with or without caffeine). ? Drinks that contain alcohol. ? Energy drinks and sports drinks. ? Bubbly (carbonated) drinks or sodas. ? Chocolate and cocoa. ? Peppermint and mint flavorings. ? Garlic and onions. ? Horseradish. ? Spicy and acidic foods. These include peppers, chili powder, curry powder, vinegar, hot sauces, and BBQ sauce. ? Citrus fruit juices and citrus fruits, such as  oranges, lemons, and limes. ? Tomato-based foods. These include red sauce, chili, salsa, and pizza with red sauce. ? Fried and fatty foods. These include donuts, french fries, potato chips, and high-fat dressings. ? High-fat meats. These include hot dogs, rib eye steak, sausage, ham, and bacon. ? High-fat dairy items, such as whole milk, butter, and cream cheese.  Eat small meals often. Avoid eating large meals.  Avoid drinking large amounts of liquid with your meals.  Avoid eating  meals during the 2-3 hours before bedtime.  Avoid lying down right after you eat.  Do not exercise right after you eat. Lifestyle   Do not use any products that contain nicotine or tobacco. These include cigarettes, e-cigarettes, and chewing tobacco. If you need help quitting, ask your doctor.  Try to lower your stress. If you need help doing this, ask your doctor.  If you are overweight, lose an amount of weight that is healthy for you. Ask your doctor about a safe weight loss goal. General instructions  Pay attention to any changes in your symptoms.  Take over-the-counter and prescription medicines only as told by your doctor. Do not take aspirin, ibuprofen, or other NSAIDs unless your doctor says it is okay.  Wear loose clothes. Do not wear anything tight around your waist.  Raise (elevate) the head of your bed about 6 inches (15 cm).  Avoid bending over if this makes your symptoms worse.  Keep all follow-up visits as told by your doctor. This is important. Contact a doctor if:  You have new symptoms.  You lose weight and you do not know why.  You have trouble swallowing or it hurts to swallow.  You have wheezing or a cough that keeps happening.  Your symptoms do not get better with treatment.  You have a hoarse voice. Get help right away if:  You have pain in your arms, neck, jaw, teeth, or back.  You feel sweaty, dizzy, or light-headed.  You have chest pain or shortness of breath.  You throw up (vomit) and your throw-up looks like blood or coffee grounds.  You pass out (faint).  Your poop (stool) is bloody or black.  You cannot swallow, drink, or eat. Summary  If a person has gastroesophageal reflux disease (GERD), food and stomach acid move back up into the esophagus and cause symptoms or problems such as damage to the esophagus.  Treatment will depend on how bad your symptoms are.  Follow a diet as told by your doctor.  Take all medicines only as  told by your doctor. This information is not intended to replace advice given to you by your health care provider. Make sure you discuss any questions you have with your health care provider. Document Released: 02/28/2008 Document Revised: 03/20/2018 Document Reviewed: 03/20/2018 Elsevier Interactive Patient Education  2019 [employer].

## 2019-01-23 LAB — T-HELPER CELL (CD4) - (RCID CLINIC ONLY)
CD4 % Helper T Cell: 26 % — ABNORMAL LOW (ref 33–65)
CD4 T Cell Abs: 199 /uL — ABNORMAL LOW (ref 400–1790)

## 2019-01-28 LAB — LIPID PANEL
Cholesterol: 113 mg/dL (ref <200)
HDL: 56 mg/dL (ref >=40)
LDL Cholesterol (Calc): 40 mg/dL (calc)
Non-HDL Cholesterol (Calc): 57 mg/dL (calc) (ref <130)
Total CHOL/HDL Ratio: 2 (calc) (ref <5.0)
Triglycerides: 86 mg/dL (ref <150)

## 2019-01-28 LAB — COMPLETE METABOLIC PANEL WITH GFR
AG Ratio: 1.5 (calc) (ref 1.0–2.5)
ALT: 7 U/L — ABNORMAL LOW (ref 9–46)
AST: 12 U/L (ref 10–35)
Albumin: 3.8 g/dL (ref 3.6–5.1)
Alkaline phosphatase (APISO): 51 U/L (ref 35–144)
BUN: 16 mg/dL (ref 7–25)
CO2: 25 mmol/L (ref 20–32)
Calcium: 9.2 mg/dL (ref 8.6–10.3)
Chloride: 115 mmol/L — ABNORMAL HIGH (ref 98–110)
Creat: 0.92 mg/dL (ref 0.70–1.33)
GFR, Est African American: 107 mL/min/{1.73_m2} (ref >=60)
GFR, Est Non African American: 92 mL/min/{1.73_m2} (ref >=60)
Globulin: 2.5 g/dL (calc) (ref 1.9–3.7)
Glucose, Bld: 74 mg/dL (ref 65–99)
Potassium: 4.2 mmol/L (ref 3.5–5.3)
Sodium: 144 mmol/L (ref 135–146)
Total Bilirubin: 0.4 mg/dL (ref 0.2–1.2)
Total Protein: 6.3 g/dL (ref 6.1–8.1)

## 2019-01-28 LAB — CBC WITH DIFFERENTIAL/PLATELET
Absolute Monocytes: 440 cells/uL (ref 200–950)
Basophils Absolute: 11 cells/uL (ref 0–200)
Basophils Relative: 0.3 %
Eosinophils Absolute: 59 cells/uL (ref 15–500)
Eosinophils Relative: 1.6 %
HCT: 41.2 % (ref 38.5–50.0)
Hemoglobin: 14.5 g/dL (ref 13.2–17.1)
Lymphs Abs: 770 cells/uL — ABNORMAL LOW (ref 850–3900)
MCH: 35.5 pg — ABNORMAL HIGH (ref 27.0–33.0)
MCHC: 35.2 g/dL (ref 32.0–36.0)
MCV: 101 fL — ABNORMAL HIGH (ref 80.0–100.0)
MPV: 10.8 fL (ref 7.5–12.5)
Monocytes Relative: 11.9 %
Neutro Abs: 2420 cells/uL (ref 1500–7800)
Neutrophils Relative %: 65.4 %
Platelets: 120 10*3/uL — ABNORMAL LOW (ref 140–400)
RBC: 4.08 10*6/uL — ABNORMAL LOW (ref 4.20–5.80)
RDW: 14.5 % (ref 11.0–15.0)
Total Lymphocyte: 20.8 %
WBC: 3.7 10*3/uL — ABNORMAL LOW (ref 3.8–10.8)

## 2019-01-28 LAB — RPR: RPR Ser Ql: NONREACTIVE

## 2019-01-28 LAB — HIV-1 RNA QUANT-NO REFLEX-BLD
HIV 1 RNA Quant: <20 copies/mL — AB
HIV-1 RNA Quant, Log: <1.3 Log copies/mL — AB

## 2019-01-29 ENCOUNTER — Telehealth: Payer: Self-pay

## 2019-01-29 ENCOUNTER — Telehealth: Payer: Self-pay | Admitting: Family Medicine

## 2019-01-29 NOTE — Telephone Encounter (Signed)
Tamisha with [employer]ed called for prior authorization please follow up.

## 2019-01-29 NOTE — Progress Notes (Signed)
Please give [name] Herring to call to let him know that his HIV viral load was undetectable.  He is doing an excellent job taking his Phillips Odor once a day and I hope he continues.

## 2019-01-29 NOTE — Telephone Encounter (Signed)
[name] Herring called and states that prior auth is needed for his spriva inhaler. Please follow up  Key# [facility]

## 2019-01-30 ENCOUNTER — Telehealth: Payer: Self-pay

## 2019-01-30 NOTE — Telephone Encounter (Signed)
-----   Message from [place] Callas, NP sent at 01/29/2019  5:03 PM EDT ----- Please give [name] Herring to call to let him know that his HIV viral load was undetectable.  He is doing an excellent job taking his Phillips Odor once a day and I hope he continues.

## 2019-01-30 NOTE — Telephone Encounter (Signed)
Called patient with results per Janene Madeira NP to relay results.  Spoke to [employer], verified identity, discussed results, has no questions or concerns today.   Lenore Cordia, [place]

## 2019-01-30 NOTE — Telephone Encounter (Signed)
Error

## 2019-02-21 ENCOUNTER — Encounter: Payer: Self-pay | Admitting: *Deleted

## 2019-02-27 ENCOUNTER — Telehealth: Payer: Self-pay | Admitting: *Deleted

## 2019-02-27 NOTE — Telephone Encounter (Signed)
Received signed release of information from patient's pharmacy asking for confirmation of his medication list for bubble packing.  RN asked [facility] to also speak with his primary care, but confirmed he was on short-term pantoprazole and long term biktarvy.  Will 'cc [facility] Outreach RN Delories Heinz as well. Landis Gandy, RN

## 2019-02-28 ENCOUNTER — Other Ambulatory Visit: Payer: Self-pay

## 2019-02-28 MED ORDER — BICTEGRAVIR-EMTRICITAB-TENOFOV 50-200-25 MG PO TABS: ORAL_TABLET | ORAL | 2 refills | Status: DC

## 2019-03-04 ENCOUNTER — Telehealth: Payer: Self-pay | Admitting: *Deleted

## 2019-03-04 ENCOUNTER — Telehealth: Payer: Self-pay | Admitting: Pharmacy Technician

## 2019-03-04 NOTE — Telephone Encounter (Signed)
[facility] Patient Advocate Encounter   I was successful in securing patient a $7500 grant from [employer] (PAF) to provide copayment coverage for Biktarvy. This will make the out of pocket cost $0.     I reached out to the patient but no answer or voicemail.    The billing information is as follows and has been shared with [facility]. They have the medicine ready for pick up for the patient.  RxBin: [id number]  PCN: [id number]  Member ID: [id number] Group ID: [id number] Dates of Eligibility: 03/04/2019 through 03/03/2020  [name] Herring, [name] [name] [facility]ious [facility] Phone: [phone] Fax: [phone] 03/04/2019 4:03 PM

## 2019-03-04 NOTE — Telephone Encounter (Addendum)
Returned a call from [facility]/Ali, who states the patient has a uncovered copay cost of 175 dollars for his Biktarvy. Called [facility]/Christie and the system states his MCR terminated in 2019. MCD will not pay because there system is saying he has other coverage.    Called Mr Cinquemani and he is unaware of his MCR status.Contacted MCR [facility] [phone]/[id number] and conferenced in Mr. Gil.  Nira Conn confirmed that Mr. Barthelemy' [facility] was terminated on march of 2019 but now he has [facility] effective June 1st 2019. Active prescription Drug ID through [facility] is [id number].  During the call we applied for the Extra Help Program through [place] which will cover his copay cost and the patient will receive information on how to apply for [facility] MCR Savings Program to cover or reduce his current 144.00 monthly premium cost.    During this conference call I received a message from [place] stating she was able to get the patient approved for a grant. She has already called [place] and his copay is now 0.00!   Initial Assessment Points to Consider for Care             Points are not all inclusive to services and educated provided but supports the patient's Individualized Plan of Care.   Is Home safe for visits? Yes / Are all firearms or weapons secure? yes  Insurance Coverage: Medicare  1st HIV Diagnosis: Nov 1999  Mode of HIV Transmission: MSM  Functional Status: able to perform ADL's independently with additional time given  Current Housing/Needs: no housing needs at this time, lives in an apartment alone   Social Support/System: Dtr who lives in [place] in [place]) no longer sister(Barbara)  Culture/Religion/Spirituality:none noted that would create barriers to care  Educational Background:   Legal Issues: none noted  Access/Utilization of Community Resources:27% no show rate  Mental Health Concerns/Diagnosis: Schizoaffective disorder  Alcohol and/or Drug Use: Hx of  cocaine abuse, current alcohol abuse  Risk and Knowledge of HIV and Reduction in Transmission:  Very knowledgeable able mode of transmission and ways to reduce transmission Nutritional Needs: not at this time    Frequency / Duration of [place] visits: Effective 04/17/2019  10mo4  4PRN's for complications with disease process/progression, medication changes or concerns   [facility] will assess for learning needs related to diagnosis and treatment regimen, provide education as needed, fill pill box if needed, address any barriers which may be preventing medication compliance, and communicating with care team including physician and case manager.   Individualized Plan Of Care with Certification Period from 04/17/19 to 07/16/19             a. Type of service(s) and care to be delivered: RN Case Management             b. Frequency and duration of service: Effective 04/17/19  31mo4, 4 prns for complications with disease process/progression, medication changes or concerns . Visits/Contact may be conducted   telephonically or in person to best suit the patient.             c. Activity restrictions: Pt may be up as tolerated and can safely ambulate without the need for a assistive device. Additional time does need to be given to complete task              d. Safety Measures: Standard Precautions/Infection Control              e. Service Objectives and Goals: Service Objectives are to assist  the pt with HIV medication regimen adherence and staying in care with the Infectious Disease Clinic by identifying barriers to care. RN will address the barriers that are identified by the patient. Current the patient is capable to taking his own medications but states he would like help and reminders with his care. Patient states he is struggling with alcoholism and thoughts of abusing drugs and would like to have a support person to engage with him. Declines rehab at this time               f. Equipment required: No  additional equipment needs at this time              g. Functional Limitations: Vision. Pt has corrective glasses that he wears              h. Rehabilitation potential: Guarded              i. Diet and Nutritional Needs: Regular Diet              j. Medications and treatments: Medications have been reconciled and reviewed and are a part of EPIC electronic file              k. Specific therapies if needed: RN              l. Pertinent diagnoses: HIV disease,  Hx of medication NonCompliance, Schizoaffective disorder             m. Expected outcome: Guarded

## 2019-04-11 ENCOUNTER — Other Ambulatory Visit: Payer: Self-pay | Admitting: *Deleted

## 2019-04-11 DIAGNOSIS — K254 Chronic or unspecified gastric ulcer with hemorrhage: Secondary | ICD-10-CM

## 2019-04-11 MED ORDER — PANTOPRAZOLE SODIUM 20 MG PO TBEC: 20.0000 mg | DELAYED_RELEASE_TABLET | Freq: Every day | ORAL | 1 refills | Status: DC

## 2019-04-21 NOTE — Telephone Encounter (Signed)
I approve of this plan of care. 

## 2019-04-24 ENCOUNTER — Ambulatory Visit: Payer: Self-pay | Admitting: *Deleted

## 2019-04-24 ENCOUNTER — Telehealth: Payer: Self-pay | Admitting: *Deleted

## 2019-04-24 DIAGNOSIS — Z594 Lack of adequate food and safe drinking water: Secondary | ICD-10-CM

## 2019-04-24 DIAGNOSIS — F1021 Alcohol dependence, in remission: Secondary | ICD-10-CM

## 2019-04-24 DIAGNOSIS — F200 Paranoid schizophrenia: Secondary | ICD-10-CM

## 2019-04-24 DIAGNOSIS — Z5941 Food insecurity: Secondary | ICD-10-CM

## 2019-04-24 DIAGNOSIS — B20 Human immunodeficiency virus [HIV] disease: Secondary | ICD-10-CM

## 2019-04-24 NOTE — Telephone Encounter (Signed)
Call placed to [name] Herring today and he states he is feeling well and has all of his medications but he is out of food. Explained to him that I would be happy to assist and arrangements made to bring [name] Herring meats and perishable foods.

## 2019-05-20 ENCOUNTER — Telehealth: Payer: Self-pay | Admitting: Infectious Diseases

## 2019-05-20 ENCOUNTER — Telehealth: Payer: Self-pay | Admitting: Family Medicine

## 2019-05-20 NOTE — Telephone Encounter (Signed)
COVID-19 Pre-Screening Questions: ° °Do you currently have a fever (>100 °F), chills or unexplained body aches? N ° °Are you currently experiencing new cough, shortness of breath, sore throat, runny nose? N °•  °Have you recently travelled outside the state of  in the last 14 days? N  °•  °Have you been in contact with someone that is currently pending confirmation of Covid19 testing or has been confirmed to have the Covid19 virus?  N ° °**If the patient answers NO to ALL questions -  advise the patient to please call the clinic before coming to the office should any symptoms develop.  ° ° ° °

## 2019-05-21 ENCOUNTER — Other Ambulatory Visit: Payer: Self-pay

## 2019-05-21 ENCOUNTER — Ambulatory Visit: Payer: Medicare Other | Admitting: Infectious Diseases

## 2019-05-21 NOTE — Progress Notes (Signed)
Call made to [name] Herring who states he is out of food. Traveled to [employer] and [employer] (proteins). Home visit made with education provided on medication adherence, local food banks and hot meals. [name] Herring appears under the influence of an substance so visit was cut short. He denies concerns with medication adherence, SOB or chest pains at this time. He denies any concerns with the exception of needing food.

## 2019-05-22 ENCOUNTER — Ambulatory Visit: Payer: Medicare Other | Admitting: Infectious Diseases

## 2019-05-26 ENCOUNTER — Encounter: Payer: Self-pay | Admitting: *Deleted

## 2019-05-28 ENCOUNTER — Telehealth: Payer: Self-pay

## 2019-05-28 NOTE — Telephone Encounter (Signed)
Thank you :)

## 2019-05-28 NOTE — Telephone Encounter (Signed)
Received call per pharmacy to send 3 month refill supply for Biktarvy. Patient has met his insurance deductible.  [name] Herring

## 2019-05-29 ENCOUNTER — Other Ambulatory Visit: Payer: Self-pay

## 2019-05-29 ENCOUNTER — Telehealth: Payer: Self-pay

## 2019-05-29 DIAGNOSIS — Z125 Encounter for screening for malignant neoplasm of prostate: Secondary | ICD-10-CM | POA: Diagnosis not present

## 2019-05-29 DIAGNOSIS — Z79899 Other long term (current) drug therapy: Secondary | ICD-10-CM | POA: Diagnosis not present

## 2019-05-29 DIAGNOSIS — Z0000 Encounter for general adult medical examination without abnormal findings: Secondary | ICD-10-CM | POA: Diagnosis not present

## 2019-05-29 MED ORDER — BIKTARVY 50-200-25 MG PO TABS: ORAL_TABLET | ORAL | 0 refills | Status: DC

## 2019-05-29 NOTE — Telephone Encounter (Signed)
Yes please OK to refill.  Thank you!

## 2019-05-29 NOTE — Telephone Encounter (Signed)
Called [facility] pharmacy to confirm when patient last picked up prescription for Biktarvy. Pharmacist states that patient last picked up prescription on 8/11, and has no more refills on file.  Called patient to see how he's doing on Biktarvy. States that he has been out for one week now. Patient is not sure if he has any medication on hand. Will route message to NP to advise if refill is okay. [place]

## 2019-06-05 ENCOUNTER — Emergency Department (HOSPITAL_COMMUNITY)
Admission: EM | Admit: 2019-06-05 | Discharge: 2019-06-06 | Disposition: A | Discharge status: Home / Self Care | Payer: Medicare PPO | Attending: Emergency Medicine | Admitting: Emergency Medicine

## 2019-06-05 ENCOUNTER — Other Ambulatory Visit: Payer: Self-pay

## 2019-06-05 DIAGNOSIS — I10 Essential (primary) hypertension: Secondary | ICD-10-CM | POA: Diagnosis not present

## 2019-06-05 DIAGNOSIS — Z20828 Contact with and (suspected) exposure to other viral communicable diseases: Secondary | ICD-10-CM | POA: Insufficient documentation

## 2019-06-05 DIAGNOSIS — Z91040 Latex allergy status: Secondary | ICD-10-CM | POA: Insufficient documentation

## 2019-06-05 DIAGNOSIS — Z7901 Long term (current) use of anticoagulants: Secondary | ICD-10-CM | POA: Diagnosis not present

## 2019-06-05 DIAGNOSIS — R05 Cough: Secondary | ICD-10-CM

## 2019-06-05 DIAGNOSIS — Z21 Asymptomatic human immunodeficiency virus [HIV] infection status: Secondary | ICD-10-CM | POA: Diagnosis not present

## 2019-06-05 DIAGNOSIS — F17210 Nicotine dependence, cigarettes, uncomplicated: Secondary | ICD-10-CM | POA: Insufficient documentation

## 2019-06-05 DIAGNOSIS — E039 Hypothyroidism, unspecified: Secondary | ICD-10-CM | POA: Insufficient documentation

## 2019-06-05 DIAGNOSIS — Z20822 Contact with and (suspected) exposure to covid-19: Secondary | ICD-10-CM

## 2019-06-05 DIAGNOSIS — R059 Cough, unspecified: Secondary | ICD-10-CM

## 2019-06-05 DIAGNOSIS — K921 Melena: Secondary | ICD-10-CM | POA: Insufficient documentation

## 2019-06-05 DIAGNOSIS — Z79899 Other long term (current) drug therapy: Secondary | ICD-10-CM | POA: Diagnosis not present

## 2019-06-05 NOTE — ED Triage Notes (Signed)
Per EMS, Pt has a hx of COPD. Complaints of SOB on for the last week. Pt also complaining of rectal bleeding. Had a BM at 1630 and saw blood in stool. Pt had a similar episode apprx one year ago. Pt is currently taking eliquis. B/P-142 Palp,P- 65, R-14, O2-96% RM, T-98.4

## 2019-06-06 ENCOUNTER — Emergency Department (HOSPITAL_COMMUNITY): Payer: Medicare PPO

## 2019-06-06 LAB — CBC
HCT: 42.1 % (ref 39.0–52.0)
Hemoglobin: 14 g/dL (ref 13.0–17.0)
MCH: 34.3 pg — ABNORMAL HIGH (ref 26.0–34.0)
MCHC: 33.3 g/dL (ref 30.0–36.0)
MCV: 103.2 fL — ABNORMAL HIGH (ref 80.0–100.0)
Platelets: 114 10*3/uL — ABNORMAL LOW (ref 150–400)
RBC: 4.08 MIL/uL — ABNORMAL LOW (ref 4.22–5.81)
RDW: 15 % (ref 11.5–15.5)
WBC: 4.2 10*3/uL (ref 4.0–10.5)
nRBC: 0 % (ref 0.0–0.2)

## 2019-06-06 LAB — COMPREHENSIVE METABOLIC PANEL
ALT: 12 U/L (ref 0–44)
AST: 16 U/L (ref 15–41)
Albumin: 3.8 g/dL (ref 3.5–5.0)
Alkaline Phosphatase: 41 U/L (ref 38–126)
Anion gap: 7 (ref 5–15)
BUN: 17 mg/dL (ref 6–20)
CO2: 26 mmol/L (ref 22–32)
Calcium: 9.1 mg/dL (ref 8.9–10.3)
Chloride: 107 mmol/L (ref 98–111)
Creatinine, Ser: 0.93 mg/dL (ref 0.61–1.24)
GFR calc Af Amer: >60 mL/min (ref >60)
GFR calc non Af Amer: >60 mL/min (ref >60)
Glucose, Bld: 82 mg/dL (ref 70–99)
Potassium: 3.7 mmol/L (ref 3.5–5.1)
Sodium: 140 mmol/L (ref 135–145)
Total Bilirubin: 0.6 mg/dL (ref 0.3–1.2)
Total Protein: 6.6 g/dL (ref 6.5–8.1)

## 2019-06-06 LAB — TYPE AND SCREEN
ABO/RH(D): A POS
Antibody Screen: NEGATIVE

## 2019-06-06 LAB — PROTIME-INR
INR: 1.1 (ref 0.8–1.2)
Prothrombin Time: 14.5 seconds (ref 11.4–15.2)

## 2019-06-06 LAB — POC OCCULT BLOOD, ED: Fecal Occult Bld: NEGATIVE

## 2019-06-06 NOTE — ED Provider Notes (Signed)
[place] DEPT Provider Note  CSN: [id number] Arrival date & time: 06/05/19 2211  Chief Complaint(s) COPD and Rectal Bleeding  HPI [name] Herring is a 58 y.o. male   The history is provided by the patient.  Cough Cough characteristics:  Non-productive Severity:  Moderate Onset quality:  Gradual Duration: 6 months. Timing:  Intermittent Progression:  Waxing and waning Chronicity:  Chronic Smoker: yes   Context: sick contacts (possible COVID)   Relieved by:  Nothing Worsened by:  Nothing Associated symptoms: chills and myalgias   Associated symptoms: no fever, no rhinorrhea and no sinus congestion     Patient also reported having a bloody BM this evening of maroon blood. Similar episodes in past. On ELiquis for prior PEs. Last colonoscopy in 2018 notable only for one small polyp that was removed; otherwise negative.  Last HIV quant low, but with <200 CD4 count.   Past Medical History Past Medical History:  Diagnosis Date  . Depression   . HIV (human immunodeficiency virus infection) ([place])   . Hypertension   . Immune deficiency disorder ([place])   . PE (pulmonary thromboembolism) ([place]) 2018  . Personality disorder ([place])   . Schizophrenia [facility])    Patient Active Problem List   Diagnosis Date Noted  . GERD with esophagitis 01/22/2019  . Protein C deficiency ([place]) 07/05/2017  . Benign neoplasm of transverse colon   . Unintentional weight loss 06/14/2017  . Chest pain on exertion 06/12/2017  . Emphysema lung ([place]) 11/20/2016  . DVT of lower extremity, bilateral ([place]) 11/04/2016  . Tobacco abuse 11/02/2016  . PE (pulmonary thromboembolism) ([place]) 11/02/2016  . Cocaine dependence with cocaine-induced mood disorder ([place]) 03/29/2016  . HTN (hypertension) 08/25/2015  . Paranoid schizophrenia, chronic condition ([place]) 12/30/2014  . Alcohol use disorder, severe, in early remission ([place]) 12/27/2014  . Human immunodeficiency virus (HIV) disease  ([place]) 11/09/2006  . CA IN SITU, RECTUM 11/09/2006  . Hypothyroidism 11/09/2006  . DISORDER, BIPOLAR NOS 11/09/2006  . Depression 11/09/2006   Home Medication(s) Prior to Admission medications   Medication Sig Start Date End Date Taking? Authorizing Provider  acetaminophen (TYLENOL) 500 MG tablet Take 1 tablet (500 mg total) by mouth every 6 (six) hours as needed for moderate pain. 11/07/16  Yes Eugenie Filler, MD  apixaban (ELIQUIS) 5 MG TABS tablet Take 1 tablet (5 mg total) by mouth 2 (two) times daily. 01/22/19  Yes Charlott Rakes, MD  benztropine (COGENTIN) 1 MG tablet 1 tab in the morning 2 tabs at night Patient taking differently: Take 1-2 mg by mouth See admin instructions. Take 1 tablet in the morning and take 2 tablets at night 11/10/16  Yes McClung, Angela M, PA-C  bictegravir-emtricitabine-tenofovir AF (BIKTARVY) 50-200-25 MG TABS tablet TAKE ONE TABLET BY MOUTH DAILY. TRY TO TAKE AT THE SAME TIME EACH DAY WITH OR WITHOUT FOOD. 05/29/19  Yes [place] Callas, NP  cetirizine (ZYRTEC) 10 MG tablet Take 1 tablet (10 mg total) by mouth daily. 02/26/18  Yes Charlott Rakes, MD  haloperidol (HALDOL) 10 MG tablet Take 0.5 tablets (5 mg total) by mouth 2 (two) times daily. 06/18/17  Yes Domenic Polite, MD  lisinopril (ZESTRIL) 10 MG tablet Take 1 tablet (10 mg total) by mouth daily. 01/22/19  Yes Charlott Rakes, MD  magnesium oxide (MAG-OX) 400 (241.3 Mg) MG tablet Take 1 tablet (400 mg total) by mouth 2 (two) times daily. 11/07/16  Yes Eugenie Filler, MD  metoprolol tartrate (LOPRESSOR) 25 MG tablet Take  1 tablet (25 mg total) by mouth 2 (two) times daily. 08/07/18  Yes Charlott Rakes, MD  mirtazapine (REMERON) 30 MG tablet Take 1 tablet (30 mg total) by mouth at bedtime. 06/20/17  Yes [place] Callas, NP  Multiple Vitamin (MULTIVITAMIN WITH MINERALS) TABS tablet Take 1 tablet by mouth daily. 11/08/16  Yes Eugenie Filler, MD  pantoprazole (PROTONIX) 20 MG tablet Take 1 tablet (20 mg  total) by mouth daily at 6 PM. Take 30 minutes before dinner on an empty stomach. 04/11/19  Yes Campbell Riches, MD  pantoprazole (PROTONIX) 40 MG tablet Take 40 mg by mouth daily.  05/29/19  Yes [provider]  polyethylene glycol (MIRALAX / GLYCOLAX) packet Take 17 g by mouth daily as needed for moderate constipation.    Yes [provider]  QUEtiapine (SEROQUEL) 400 MG tablet Take 400 mg by mouth at bedtime.  03/27/19  Yes [provider]  tiotropium (SPIRIVA) 18 MCG inhalation capsule Place 1 capsule (18 mcg total) into inhaler and inhale daily. 01/22/19  Yes Charlott Rakes, MD  traZODone (DESYREL) 50 MG tablet Take 1 tablet (50 mg total) by mouth at bedtime. 08/07/18  Yes Charlott Rakes, MD  Valbenazine Tosylate (INGREZZA) 80 MG CAPS Take 80 mg by mouth 2 (two) times daily.    Yes [provider]  butalbital-acetaminophen-caffeine (ESGIC) 50-325-40 MG tablet Take 1 tablet by mouth 2 (two) times daily as needed for headache. Patient not taking: Reported on 06/05/2019 02/26/18   Charlott Rakes, MD  umeclidinium bromide (INCRUSE ELLIPTA) 62.5 MCG/INH AEPB Inhale 1 puff into the lungs daily. Patient not taking: Reported on 06/05/2019 01/22/19   Charlott Rakes, MD                                                                                                                                    Past Surgical History Past Surgical History:  Procedure Laterality Date  . COLONOSCOPY WITH PROPOFOL N/A 06/15/2017   Procedure: COLONOSCOPY WITH PROPOFOL;  Surgeon: Irene Shipper, MD;  Location: [facility] ENDOSCOPY;  Service: Endoscopy;  Laterality: N/A;  . ESOPHAGOGASTRODUODENOSCOPY (EGD) WITH PROPOFOL N/A 06/15/2017   Procedure: ESOPHAGOGASTRODUODENOSCOPY (EGD) WITH PROPOFOL;  Surgeon: Irene Shipper, MD;  Location: [facility] ENDOSCOPY;  Service: Endoscopy;  Laterality: N/A;  . IR GENERIC HISTORICAL  11/04/2016   IR IVC FILTER PLMT / S&I Burke Keels GUID/MOD SED 11/04/2016 Aletta Edouard, MD  [facility] RAD   Family History Family History  Problem Relation Age of Onset  . CAD Mother   . Kidney disease Sister   . Lupus Brother   . Cancer Maternal Grandmother   . Stroke Paternal Grandmother     Social History Social History   Tobacco Use  . Smoking status: Current Every Day Smoker    Packs/day: 1.00    Years: 33.00    Pack years: 33.00    Types: Cigarettes  . Smokeless tobacco: Never Used  .  Tobacco comment: cutting back  Substance Use Topics  . Alcohol use: Yes    Alcohol/week: 3.0 standard drinks    Types: 3 Shots of liquor per week    Comment: per patient drinks one pint a month 06/20/17  . Drug use: Yes    Comment: crack    Allergies Amoxicillin, Latex, and Abilify [aripiprazole]  Review of Systems Review of Systems  Constitutional: Positive for chills. Negative for fever.  HENT: Negative for rhinorrhea.   Respiratory: Positive for cough.   Musculoskeletal: Positive for myalgias.   All other systems are reviewed and are negative for acute change except as noted in the HPI  Physical Exam Vital Signs  I have reviewed the triage vital signs BP (!) 160/105 (BP Location: Left Arm)   Pulse 79   Temp 98.6 F (37 C) (Oral)   Resp (!) 24   Ht 5\' 9"  (1.753 m)   Wt 60.8 kg   SpO2 100%   BMI 19.79 kg/m   Physical Exam Vitals signs reviewed. Exam conducted with a chaperone present.  Constitutional:      General: He is not in acute distress.    Appearance: He is well-developed. He is not diaphoretic.  HENT:     Head: Normocephalic and atraumatic.     Nose: Nose normal.  Eyes:     General: No scleral icterus.       Right eye: No discharge.        Left eye: No discharge.     Conjunctiva/sclera: Conjunctivae normal.     Pupils: Pupils are equal, round, and reactive to light.  Neck:     Musculoskeletal: Normal range of motion and neck supple.  Cardiovascular:     Rate and Rhythm: Normal rate and regular rhythm.     Heart sounds: No murmur. No  friction rub. No gallop.   Pulmonary:     Effort: Pulmonary effort is normal. No respiratory distress.     Breath sounds: Normal breath sounds. No stridor. No rales.  Abdominal:     General: There is no distension.     Palpations: Abdomen is soft.     Tenderness: There is no abdominal tenderness.  Genitourinary:    Rectum: Normal. Guaiac result negative. No external hemorrhoid. Normal anal tone.  Musculoskeletal:        General: No tenderness.  Skin:    General: Skin is warm and dry.     Findings: No erythema or rash.  Neurological:     Mental Status: He is alert and oriented to person, place, and time.     ED Results and Treatments Labs (all labs ordered are listed, but only abnormal results are displayed) Labs Reviewed  CBC - Abnormal; Notable for the following components:      Result Value   RBC 4.08 (*)    MCV 103.2 (*)    MCH 34.3 (*)    Platelets 114 (*)    All other components within normal limits  NOVEL CORONAVIRUS, NAA (HOSP ORDER, SEND-OUT TO REF LAB; TAT 18-24 HRS)  COMPREHENSIVE METABOLIC PANEL  PROTIME-INR  POC OCCULT BLOOD, ED  TYPE AND SCREEN  EKG  EKG Interpretation  Date/Time:  Friday June 06 2019 00:54:33 EDT Ventricular Rate:  53 PR Interval:    QRS Duration: 92 QT Interval:  456 QTC Calculation: 429 R Axis:   22 Text Interpretation:  Sinus rhythm Probable left atrial enlargement No significant change since last tracing Confirmed by Addison Lank [phone]) on 06/06/2019 1:23:35 AM      Radiology Dg Chest 2 View  Result Date: 06/06/2019 CLINICAL DATA:  History of COPD, shortness of breath EXAM: CHEST - 2 VIEW COMPARISON:  July 31, 2018 FINDINGS: The heart size and mediastinal contours are within normal limits. There is hyperinflation of the upper lung zones. Mildly increased interstitial markings seen at both lung bases as  on prior exam. No large airspace consolidation or pleural effusion. The visualized skeletal structures are unremarkable. IMPRESSION: No active cardiopulmonary disease. Mildly increased interstitial markings at both lung bases which could be due to chronic lung changes. Electronically Signed   By: Prudencio Pair M.D.   On: 06/06/2019 02:05    Pertinent labs & imaging results that were available during my care of the patient were reviewed by me and considered in my medical decision making (see chart for details).  Medications Ordered in ED Medications - No data to display                                                                                                                                  Procedures Procedures  (including critical care time)  Medical Decision Making / ED Course I have reviewed the nursing notes for this encounter and the patient's prior records (if available in EHR or on provided paperwork).   [name] Herring was evaluated in Emergency Department on 06/06/2019 for the symptoms described in the history of present illness. He was evaluated in the context of the global COVID-19 pandemic, which necessitated consideration that the patient might be at risk for infection with the SARS-CoV-2 virus that causes COVID-19. Institutional protocols and algorithms that pertain to the evaluation of patients at risk for COVID-19 are in a state of rapid change based on information released by regulatory bodies including the CDC and federal and state organizations. These policies and algorithms were followed during the patient's care in the ED.  1. Cough, myalgia. Possible COVID exposure. AFVSS Lungs clear. Labs w/o leukocytosis. CXR negative. Patient evaluated, tested and sent home with instructions for home care and Quarantine. Instructed to seek further care if symptoms worsen.  Is set up with follow up for a virtual visit with PCP in next 24-48 hours.  2. Hematochezia No active  bleeding. Hemoccult negative Hb stable  The patient appears reasonably screened and/or stabilized for discharge and I doubt any other medical condition or other [facility] requiring further screening, evaluation, or treatment in the ED at this time prior to discharge.  The patient is safe for discharge with strict return precautions.  Final Clinical Impression(s) / ED Diagnoses Final diagnoses:  Cough  Blood in stool  Suspected Covid-19 Virus Infection    The patient appears reasonably screened and/or stabilized for discharge and I doubt any other medical condition or other [facility] requiring further screening, evaluation, or treatment in the ED at this time prior to discharge.  Disposition: Discharge  Condition: Good  I have discussed the results, Dx and Tx plan with the patient who expressed understanding and agree(s) with the plan. Discharge instructions discussed at great length. The patient was given strict return precautions who verbalized understanding of the instructions. No further questions at time of discharge.    ED Discharge Orders    None       Follow Up: Sonia Side., FNP [address] [place] [address] [phone]  Schedule an appointment as soon as possible for a visit  As needed      This chart was dictated using voice recognition software.  Despite best efforts to proofread,  errors can occur which can change the documentation meaning.   Fatima Blank, MD 06/06/19 [phone]

## 2019-06-07 LAB — NOVEL CORONAVIRUS, NAA (HOSP ORDER, SEND-OUT TO REF LAB; TAT 18-24 HRS): SARS-CoV-2, NAA: NOT DETECTED

## 2019-06-10 ENCOUNTER — Ambulatory Visit (INDEPENDENT_AMBULATORY_CARE_PROVIDER_SITE_OTHER): Payer: Medicare PPO | Admitting: Infectious Diseases

## 2019-06-10 ENCOUNTER — Other Ambulatory Visit: Payer: Self-pay

## 2019-06-10 ENCOUNTER — Encounter: Payer: Self-pay | Admitting: Infectious Diseases

## 2019-06-10 VITALS — BP 162/111 | HR 92 | Temp 98.0°F

## 2019-06-10 DIAGNOSIS — F1021 Alcohol dependence, in remission: Secondary | ICD-10-CM

## 2019-06-10 DIAGNOSIS — I10 Essential (primary) hypertension: Secondary | ICD-10-CM

## 2019-06-10 DIAGNOSIS — B20 Human immunodeficiency virus [HIV] disease: Secondary | ICD-10-CM | POA: Diagnosis not present

## 2019-06-10 MED ORDER — BIKTARVY 50-200-25 MG PO TABS: ORAL_TABLET | ORAL | 3 refills | Status: DC

## 2019-06-10 NOTE — Patient Instructions (Signed)
Wonderful to see you!  Please continue taking your Biktarvy once a day as you are. I have sent in refills for you 90-days.   Will have you come back in 4 months to see me again for follow up care.

## 2019-06-10 NOTE — Assessment & Plan Note (Addendum)
[name] Herring has been doing so much better taking his HIV medications now very well controlled for well over a year. No longer requiring bridge counselor or [facility] assistance with adherence. I congratulated him on how well he has been doing.  Will update his viral load today and have him back in 4 months to check in again. Will refill Biktarvy 90-day supply for him as requested. Vaccines up to date He is not sexually active and declines condoms/std screening.

## 2019-06-10 NOTE — Progress Notes (Signed)
Patient Name: [name] Herring  Date of Birth: 05-Dec-1960  MRN: [id number]  PCP: Sonia Side., FNP   Patient Active Problem List   Diagnosis Date Noted  . GERD with esophagitis 01/22/2019  . Protein C deficiency ([place]) 07/05/2017  . Benign neoplasm of transverse colon   . Unintentional weight loss 06/14/2017  . Chest pain on exertion 06/12/2017  . Emphysema lung ([place]) 11/20/2016  . DVT of lower extremity, bilateral ([place]) 11/04/2016  . Tobacco abuse 11/02/2016  . PE (pulmonary thromboembolism) ([place]) 11/02/2016  . HTN (hypertension) 08/25/2015  . Paranoid schizophrenia, chronic condition ([place]) 12/30/2014  . Alcohol use disorder, severe, in early remission ([place]) 12/27/2014  . Human immunodeficiency virus (HIV) disease ([place]) 11/09/2006  . CA IN SITU, RECTUM 11/09/2006  . Hypothyroidism 11/09/2006  . DISORDER, BIPOLAR NOS 11/09/2006  . Depression 11/09/2006    SUBJECTIVE:   Brief Narrative:  [name] Herring is a 58 y.o. AA male with a history of HIV disease Dx 08/18/1998. Previously followed at [facility].  HIV Risk: MSM.  History of OIs: uncertain. History of ETOH/cocaine abuse, previously homeless.   Previous Regimens:   Kaletra + Combivir   Genvoya > stopped d/t DDI with [facility]  Biktarvy 2019 >> suppressed    CC: Follow up HIV care.  No complaints or concerns outside of his blood pressure.     HPI: [name] Herring is doing very well. He has continued to abstain from alcohol and has quit smoking completely. He has had more energy lately than ever. He has also had 2 more grand babies since our last office visit which he is excited to talk about.   Continues taking his biktarvy once daily as prescribed with excellent control over his HIV. He does not miss any doses of his medication. Needs to update labs today.   His blood pressure is elevated today which is surprising to him. He has no headaches, chest pain, palpitations or vision changes to declare. He has an appointment to  see his PCP Thursday of this week to discuss.    Review of Systems  Constitutional: Negative for chills, fever, malaise/fatigue and weight loss.  HENT: Negative for sore throat.        No dental problems  Respiratory: Negative for cough and sputum production.   Cardiovascular: Negative for chest pain and leg swelling.  Gastrointestinal: Negative for abdominal pain, diarrhea, heartburn and vomiting.  Genitourinary: Negative for dysuria and flank pain.  Musculoskeletal: Negative for joint pain, myalgias and neck pain.  Skin: Negative for rash.  Neurological: Negative for dizziness, tingling and headaches.  Psychiatric/Behavioral: Negative for depression and substance abuse. The patient is not nervous/anxious and does not have insomnia.    Past Medical History:  Diagnosis Date  . Depression   . HIV (human immunodeficiency virus infection) ([place])   . Hypertension   . Immune deficiency disorder ([place])   . PE (pulmonary thromboembolism) ([place]) 2018  . Personality disorder ([place])   . Schizophrenia [facility])    Outpatient Medications Prior to Visit  Medication Sig Dispense Refill  . acetaminophen (TYLENOL) 500 MG tablet Take 1 tablet (500 mg total) by mouth every 6 (six) hours as needed for moderate pain. 30 tablet 0  . apixaban (ELIQUIS) 5 MG TABS tablet Take 1 tablet (5 mg total) by mouth 2 (two) times daily. 180 tablet 1  . benztropine (COGENTIN) 1 MG tablet 1 tab in the morning 2 tabs at night (Patient taking differently: Take 1-2 mg by mouth  See admin instructions. Take 1 tablet in the morning and take 2 tablets at night) 14 tablet 0  . butalbital-acetaminophen-caffeine (ESGIC) 50-325-40 MG tablet Take 1 tablet by mouth 2 (two) times daily as needed for headache. 30 tablet 0  . cetirizine (ZYRTEC) 10 MG tablet Take 1 tablet (10 mg total) by mouth daily. 30 tablet 1  . haloperidol (HALDOL) 10 MG tablet Take 0.5 tablets (5 mg total) by mouth 2 (two) times daily.    Marland Kitchen lisinopril (ZESTRIL) 10 MG  tablet Take 1 tablet (10 mg total) by mouth daily. 90 tablet 1  . magnesium oxide (MAG-OX) 400 (241.3 Mg) MG tablet Take 1 tablet (400 mg total) by mouth 2 (two) times daily. 60 tablet 0  . metoprolol tartrate (LOPRESSOR) 25 MG tablet Take 1 tablet (25 mg total) by mouth 2 (two) times daily. 180 tablet 1  . mirtazapine (REMERON) 30 MG tablet Take 1 tablet (30 mg total) by mouth at bedtime. 30 tablet 3  . Multiple Vitamin (MULTIVITAMIN WITH MINERALS) TABS tablet Take 1 tablet by mouth daily.    . pantoprazole (PROTONIX) 20 MG tablet Take 1 tablet (20 mg total) by mouth daily at 6 PM. Take 30 minutes before dinner on an empty stomach. 90 tablet 1  . pantoprazole (PROTONIX) 40 MG tablet Take 40 mg by mouth daily.     . polyethylene glycol (MIRALAX / GLYCOLAX) packet Take 17 g by mouth daily as needed for moderate constipation.     . QUEtiapine (SEROQUEL) 400 MG tablet Take 400 mg by mouth at bedtime.     Marland Kitchen tiotropium (SPIRIVA) 18 MCG inhalation capsule Place 1 capsule (18 mcg total) into inhaler and inhale daily. 90 capsule 1  . traZODone (DESYREL) 50 MG tablet Take 1 tablet (50 mg total) by mouth at bedtime. 30 tablet 3  . umeclidinium bromide (INCRUSE ELLIPTA) 62.5 MCG/INH AEPB Inhale 1 puff into the lungs daily. 90 each 1  . Valbenazine Tosylate (INGREZZA) 80 MG CAPS Take 80 mg by mouth 2 (two) times daily.     . bictegravir-emtricitabine-tenofovir AF (BIKTARVY) 50-200-25 MG TABS tablet TAKE ONE TABLET BY MOUTH DAILY. TRY TO TAKE AT THE SAME TIME EACH DAY WITH OR WITHOUT FOOD. 30 tablet 0   No facility-administered medications prior to visit.    Allergies  Allergen Reactions  . Amoxicillin Shortness Of Breath and Rash    Has patient had a PCN reaction causing immediate rash, facial/tongue/throat swelling, [name] Has patient had a PCN reaction causing severe rash involving mucus membranes or skin necrosis: no Has patient had a PCN reaction that required  hospitalization: yes drs office visit Has patient had a PCN reaction occurring within the last 10 years: no If all of the above answers are "NO", then may proceed with Cephalosporin use.   . Latex Shortness Of Breath  . Abilify [Aripiprazole] Other (See Comments)    Double vision   Social History   Tobacco Use  . Smoking status: Former Smoker    Packs/day: 1.00    Years: 33.00    Pack years: 33.00    Types: Cigarettes    Quit date: 04/25/2019    Years since quitting: 0.1  . Smokeless tobacco: Never Used  Substance Use Topics  . Alcohol use: Not Currently    Comment: quit 2020  . Drug use: Not Currently    Comment: quit using 2020   Objective:  Vitals:   06/10/19 1452 06/10/19 1453  BP: Marland Kitchen)  182/101 (!) 162/111  Pulse: 91 92  Temp: 98 F (36.7 C)    There is no height or weight on file to calculate BMI.  Physical Exam  Constitutional: He is oriented to person, place, and time.  Appears well today.   Eyes: Pupils are equal, round, and reactive to light. No scleral icterus.  Cardiovascular: Normal rate and regular rhythm.  No murmur heard. Pulmonary/Chest: Effort normal. No respiratory distress.  Coarse rhonchi in upper airway. Dry cough.   Abdominal: Soft. Bowel sounds are normal. He exhibits no distension. There is no abdominal tenderness. There is no rebound and no guarding.  Musculoskeletal: Normal range of motion.  Lymphadenopathy:    He has no cervical adenopathy.  Neurological: He is alert and oriented to person, place, and time.  Skin: Skin is warm.  Psychiatric: Mood, memory and affect normal.  Vitals reviewed.  Lab Results Lab Results  Component Value Date   WBC 4.2 06/06/2019   HGB 14.0 06/06/2019   HCT 42.1 06/06/2019   MCV 103.2 (H) 06/06/2019   PLT 114 (L) 06/06/2019    Lab Results  Component Value Date   CREATININE 0.93 06/06/2019   BUN 17 06/06/2019   NA 140 06/06/2019   K 3.7 06/06/2019   CL 107 06/06/2019   CO2 26 06/06/2019    Lab  Results  Component Value Date   ALT 12 06/06/2019   AST 16 06/06/2019   ALKPHOS 41 06/06/2019   BILITOT 0.6 06/06/2019    Lab Results  Component Value Date   CHOL 113 01/22/2019   HDL 56 01/22/2019   LDLCALC 40 01/22/2019   TRIG 86 01/22/2019   CHOLHDL 2.0 01/22/2019   HIV 1 RNA Quant (copies/mL)  Date Value  01/22/2019 <20 DETECTED (A)  06/17/2018 <20 DETECTED (A)  10/24/2017 26 (H)   CD4 T Cell Abs (/uL)  Date Value  01/22/2019 199 (L)  06/17/2018 250 (L)  10/24/2017 290 (L)   Lab Results  Component Value Date   HAV REACTIVE (A) 06/17/2018   Lab Results  Component Value Date   HEPBSAG NON-REACTIVE 06/17/2018   HEPBSAB BORDERLINE (A) 06/17/2018   Lab Results  Component Value Date   HCVAB Negative 09/11/2007   Lab Results  Component Value Date   CHLAMYDIAWP Negative 01/13/2015   N Negative 01/13/2015   ASSESSMENT & PLAN:   Problem List Items Addressed This Visit      Unprioritized   Human immunodeficiency virus (HIV) disease ([place]) - Primary (Chronic)    Reavis has been doing so much better taking his HIV medications now very well controlled for well over a year. No longer requiring bridge counselor or [facility] assistance with adherence. I congratulated him on how well he has been doing.  Will update his viral load today and have him back in 4 months to check in again. Will refill Biktarvy 90-day supply for him as requested. Vaccines up to date He is not sexually active and declines condoms/std screening.        Relevant Medications   bictegravir-emtricitabine-tenofovir AF (BIKTARVY) 50-200-25 MG TABS tablet   Other Relevant Orders   HIV-1 RNA quant-no reflex-bld   Alcohol use disorder, severe, in early remission [facility])    Encouraged him to continue to abstain. His family seems to be more involved with him and supportive (as he speaks of them much more often).       HTN (hypertension)    He has taken all of his BP medications today (lisinopril  and first  dose metoprolol). He is asymptomatic currently. I suspect he needs a medication titration and informed him I would like for him to discuss with his PCP - fortunately he has an appointment with him this week to follow up on hospitalization for GI bleeding.          Janene Madeira, MSN, NP-C [facility] for Infectious Disease [email or web address] Pager: [phone] Office: [place]: [phone]    06/11/19

## 2019-06-11 ENCOUNTER — Encounter: Payer: Self-pay | Admitting: Infectious Diseases

## 2019-06-11 NOTE — Assessment & Plan Note (Signed)
He has taken all of his BP medications today (lisinopril and first dose metoprolol). He is asymptomatic currently. I suspect he needs a medication titration and informed him I would like for him to discuss with his PCP - fortunately he has an appointment with him this week to follow up on hospitalization for GI bleeding.

## 2019-06-11 NOTE — Assessment & Plan Note (Signed)
Encouraged him to continue to abstain. His family seems to be more involved with him and supportive (as he speaks of them much more often).

## 2019-06-12 LAB — HIV-1 RNA QUANT-NO REFLEX-BLD
HIV 1 RNA Quant: <20 copies/mL — AB
HIV-1 RNA Quant, Log: <1.3 Log copies/mL — AB

## 2019-06-25 ENCOUNTER — Encounter: Payer: Self-pay | Admitting: *Deleted

## 2019-07-08 ENCOUNTER — Telehealth: Payer: Self-pay | Admitting: *Deleted

## 2019-07-08 ENCOUNTER — Encounter: Payer: Self-pay | Admitting: Infectious Diseases

## 2019-07-08 ENCOUNTER — Other Ambulatory Visit: Payer: Self-pay

## 2019-07-08 ENCOUNTER — Ambulatory Visit: Payer: Self-pay | Admitting: *Deleted

## 2019-07-08 ENCOUNTER — Ambulatory Visit (INDEPENDENT_AMBULATORY_CARE_PROVIDER_SITE_OTHER): Payer: Medicare PPO | Admitting: Infectious Diseases

## 2019-07-08 VITALS — BP 183/109 | HR 88 | Temp 98.5°F | Ht 69.0 in | Wt 133.0 lb

## 2019-07-08 DIAGNOSIS — I10 Essential (primary) hypertension: Secondary | ICD-10-CM

## 2019-07-08 DIAGNOSIS — R339 Retention of urine, unspecified: Secondary | ICD-10-CM

## 2019-07-08 DIAGNOSIS — B20 Human immunodeficiency virus [HIV] disease: Secondary | ICD-10-CM

## 2019-07-08 MED ORDER — TAMSULOSIN HCL 0.4 MG PO CAPS: 0.4000 mg | ORAL_CAPSULE | Freq: Every day | ORAL | 0 refills | Status: DC

## 2019-07-08 NOTE — Progress Notes (Signed)
Patient Name: [name] Herring  Date of Birth: April 20, 1961  MRN: [id number]  PCP: Sonia Side., FNP   Patient Active Problem List   Diagnosis Date Noted   Urinary retention 07/09/2019   GERD with esophagitis 01/22/2019   Protein C deficiency ([place]) 07/05/2017   Benign neoplasm of transverse colon    Unintentional weight loss 06/14/2017   Emphysema lung ([place]) 11/20/2016   DVT of lower extremity, bilateral ([place]) 11/04/2016   Tobacco abuse 11/02/2016   PE (pulmonary thromboembolism) ([place]) 11/02/2016   HTN (hypertension) 08/25/2015   Alcohol use disorder, severe, in early remission ([place]) 12/27/2014   Human immunodeficiency virus (HIV) disease ([place]) 11/09/2006   CA IN SITU, RECTUM 11/09/2006   Hypothyroidism 11/09/2006   Paranoid schizophrenia ([place]) 11/09/2006   DISORDER, BIPOLAR NOS 11/09/2006   Depression 11/09/2006    SUBJECTIVE:   Brief Narrative:  [name] Herring is a 58 y.o. AA male with a history of HIV disease Dx 08/18/1998. Previously followed at [facility].  HIV Risk: MSM.  History of OIs: uncertain. History of ETOH/cocaine abuse, previously homeless.   Previous Regimens:   Kaletra + Combivir   Genvoya > stopped d/t DDI with [facility]  Biktarvy 2019 >> suppressed    CC: Acute visit for urinary retention.    HPI: Earlier this morning [name] Herring discussed with his home health nurse Amber that he has been having trouble passing his urine for the last 4 days.  He is worried he has a bladder infection.  He says he has completely emptied his bladder maybe 1 time over this timeframe; has had several attempts per day with partial emptying and small amount of urine.  He denies any fevers, chills, swelling, shortness of breath, abdominal pain, flank/back pain, dysuria, urethral discharge.  Prior to this sudden onset voiding difficulty he did have hesitancy and dribbling frequently during the week.  He is not sexually active and has had no partner for over 15  years.  Nothing has relieved his symptoms at this point.  He has been eating and drinking normally.  No changes to his bowel habits.   Review of Systems  Constitutional: Negative for chills, diaphoresis and fever.  Respiratory: Negative for shortness of breath.   Cardiovascular: Negative for leg swelling.  Gastrointestinal: Negative for abdominal pain and nausea.  Genitourinary: Positive for frequency. Negative for dysuria, flank pain, hematuria and urgency.       Urinary retention as described above  Musculoskeletal: Negative for back pain.  Neurological: Negative for dizziness.   Past Medical History:  Diagnosis Date   Depression    HIV (human immunodeficiency virus infection) ([place])    Hypertension    Immune deficiency disorder ([place])    PE (pulmonary thromboembolism) ([place]) 2018   Personality disorder ([place])    Schizophrenia ([place])    Outpatient Medications Prior to Visit  Medication Sig Dispense Refill   acetaminophen (TYLENOL) 500 MG tablet Take 1 tablet (500 mg total) by mouth every 6 (six) hours as needed for moderate pain. 30 tablet 0   apixaban (ELIQUIS) 5 MG TABS tablet Take 1 tablet (5 mg total) by mouth 2 (two) times daily. 180 tablet 1   benztropine (COGENTIN) 1 MG tablet 1 tab in the morning 2 tabs at night (Patient taking differently: Take 1-2 mg by mouth See admin instructions. Take 1 tablet in the morning and take 2 tablets at night) 14 tablet 0   bictegravir-emtricitabine-tenofovir AF (BIKTARVY) 50-200-25 MG TABS tablet TAKE ONE TABLET BY  MOUTH DAILY. TRY TO TAKE AT THE SAME TIME EACH DAY WITH OR WITHOUT FOOD. 90 tablet 3   butalbital-acetaminophen-caffeine (ESGIC) 50-325-40 MG tablet Take 1 tablet by mouth 2 (two) times daily as needed for headache. 30 tablet 0   cetirizine (ZYRTEC) 10 MG tablet Take 1 tablet (10 mg total) by mouth daily. 30 tablet 1   haloperidol (HALDOL) 10 MG tablet Take 0.5 tablets (5 mg total) by mouth 2 (two) times daily.      lisinopril (ZESTRIL) 10 MG tablet Take 1 tablet (10 mg total) by mouth daily. 90 tablet 1   magnesium oxide (MAG-OX) 400 (241.3 Mg) MG tablet Take 1 tablet (400 mg total) by mouth 2 (two) times daily. 60 tablet 0   metoprolol tartrate (LOPRESSOR) 25 MG tablet Take 1 tablet (25 mg total) by mouth 2 (two) times daily. 180 tablet 1   mirtazapine (REMERON) 30 MG tablet Take 1 tablet (30 mg total) by mouth at bedtime. 30 tablet 3   Multiple Vitamin (MULTIVITAMIN WITH MINERALS) TABS tablet Take 1 tablet by mouth daily.     pantoprazole (PROTONIX) 20 MG tablet Take 1 tablet (20 mg total) by mouth daily at 6 PM. Take 30 minutes before dinner on an empty stomach. 90 tablet 1   pantoprazole (PROTONIX) 40 MG tablet Take 40 mg by mouth daily.      polyethylene glycol (MIRALAX / GLYCOLAX) packet Take 17 g by mouth daily as needed for moderate constipation.      QUEtiapine (SEROQUEL) 400 MG tablet Take 400 mg by mouth at bedtime.      tiotropium (SPIRIVA) 18 MCG inhalation capsule Place 1 capsule (18 mcg total) into inhaler and inhale daily. 90 capsule 1   traZODone (DESYREL) 50 MG tablet Take 1 tablet (50 mg total) by mouth at bedtime. 30 tablet 3   umeclidinium bromide (INCRUSE ELLIPTA) 62.5 MCG/INH AEPB Inhale 1 puff into the lungs daily. 90 each 1   Valbenazine Tosylate (INGREZZA) 80 MG CAPS Take 80 mg by mouth 2 (two) times daily.      No facility-administered medications prior to visit.    Allergies  Allergen Reactions   Amoxicillin Shortness Of Breath and Rash    Has patient had a PCN reaction causing immediate rash, facial/tongue/throat swelling, [name] Has patient had a PCN reaction causing severe rash involving mucus membranes or skin necrosis: no Has patient had a PCN reaction that required hospitalization: yes drs office visit Has patient had a PCN reaction occurring within the last 10 years: no If all of the above answers are "NO", then may  proceed with Cephalosporin use.    Latex Shortness Of Breath   Abilify [Aripiprazole] Other (See Comments)    Double vision   Social History   Tobacco Use   Smoking status: Former Smoker    Packs/day: 1.00    Years: 33.00    Pack years: 33.00    Types: Cigarettes    Quit date: 04/25/2019    Years since quitting: 0.2   Smokeless tobacco: Never Used  Substance Use Topics   Alcohol use: Not Currently    Comment: quit 2020   Drug use: Not Currently    Comment: quit using 2020   Objective:  Vitals:   07/08/19 1604  BP: (!) 183/109  Pulse: 88  Temp: 98.5 F (36.9 C)  Weight: 133 lb (60.3 kg)  Height: 5\' 9"  (1.753 m)   Body mass index is 19.64 kg/m.  Physical Exam  Constitutional:  Well appearing today.  Cardiovascular: Normal rate.  Pulmonary/Chest: Effort normal.  Abdominal: Soft. Bowel sounds are normal. He exhibits no distension. There is no abdominal tenderness.  Unable to palpate bladder.  He has no suprapubic or lower abdominal tenderness.  With deep palpation it stimulated his urge to void.  Genitourinary: No discharge found.    Lab Results Lab Results  Component Value Date   WBC 4.2 06/06/2019   HGB 14.0 06/06/2019   HCT 42.1 06/06/2019   MCV 103.2 (H) 06/06/2019   PLT 114 (L) 06/06/2019    Lab Results  Component Value Date   CREATININE 0.86 07/08/2019   BUN 14 07/08/2019   NA 138 07/08/2019   K 4.0 07/08/2019   CL 106 07/08/2019   CO2 22 07/08/2019    Lab Results  Component Value Date   ALT 12 06/06/2019   AST 16 06/06/2019   ALKPHOS 41 06/06/2019   BILITOT 0.6 06/06/2019    Lab Results  Component Value Date   CHOL 113 01/22/2019   HDL 56 01/22/2019   LDLCALC 40 01/22/2019   TRIG 86 01/22/2019   CHOLHDL 2.0 01/22/2019   HIV 1 RNA Quant (copies/mL)  Date Value  06/10/2019 <20 DETECTED (A)  01/22/2019 <20 DETECTED (A)  06/17/2018 <20 DETECTED (A)   CD4 T Cell Abs (/uL)  Date Value  01/22/2019 199 (L)  06/17/2018 250 (L)   10/24/2017 290 (L)   Lab Results  Component Value Date   HAV REACTIVE (A) 06/17/2018   Lab Results  Component Value Date   HEPBSAG NON-REACTIVE 06/17/2018   HEPBSAB BORDERLINE (A) 06/17/2018    ASSESSMENT & PLAN:   Problem List Items Addressed This Visit      Unprioritized   HTN (hypertension)    His blood pressure is still very elevated today above target.  He tells me he is taking all of his medications as scheduled.  He has a follow-up with his PCP per his personal report in 2 weeks although I do not see anything scheduled.  He will check his records at home to make certain of his appointment.      Urinary retention - Primary    It seems that he has had symptoms consistent with BPH for a while with new onset acute urinary retention.  He does not have any other infectious symptoms indicating definitively a bladder infection at this time.  He has no costovertebral angle tenderness.  We will plan on starting Flomax once daily.  We will collect a urine today for microscopy and culture in case he has had no improvement after 2 to 3 days or develops signs concerning for secondary bacterial infection.  I encouraged him to continue drinking fluids normally to avoid dehydration.  We will check creatinine and electrolytes today as he has had a history of AKI in the past.      Relevant Orders   Urinalysis, Routine w reflex microscopic (Completed)   Urine Culture   Basic metabolic panel (Completed)      Janene Madeira, MSN, NP-C [facility] for Infectious Disease [email or web address] Pager: [phone] Office: [place]: [phone]    07/09/19

## 2019-07-08 NOTE — Patient Instructions (Addendum)
I am going to try you on a pill once a day in the evening called flomax.   Please follow up with your primary care team for your blood pressure. It's very high!  I will ask [name] to check in with you later this week to see how you are feeling.

## 2019-07-09 DIAGNOSIS — R339 Retention of urine, unspecified: Secondary | ICD-10-CM | POA: Insufficient documentation

## 2019-07-09 LAB — BASIC METABOLIC PANEL
BUN: 14 mg/dL (ref 7–25)
CO2: 22 mmol/L (ref 20–32)
Calcium: 9.2 mg/dL (ref 8.6–10.3)
Chloride: 106 mmol/L (ref 98–110)
Creat: 0.86 mg/dL (ref 0.70–1.33)
Glucose, Bld: 68 mg/dL (ref 65–99)
Potassium: 4 mmol/L (ref 3.5–5.3)
Sodium: 138 mmol/L (ref 135–146)

## 2019-07-09 NOTE — Assessment & Plan Note (Addendum)
His blood pressure is still very elevated today above target.  He tells me he is taking all of his medications as scheduled.  He has a follow-up with his PCP per his personal report in 2 weeks although I do not see anything scheduled.  He will check his records at home to make certain of his appointment.

## 2019-07-09 NOTE — Progress Notes (Signed)
Without convincing infectious symptoms will see how is is after 3 days of flomax. [name] Herring is going to check on him later this week - Should he develop fevers/chills/malaise will treat as UTI.

## 2019-07-09 NOTE — Assessment & Plan Note (Signed)
It seems that he has had symptoms consistent with BPH for a while with new onset acute urinary retention.  He does not have any other infectious symptoms indicating definitively a bladder infection at this time.  He has no costovertebral angle tenderness.  We will plan on starting Flomax once daily.  We will collect a urine today for microscopy and culture in case he has had no improvement after 2 to 3 days or develops signs concerning for secondary bacterial infection.  I encouraged him to continue drinking fluids normally to avoid dehydration.  We will check creatinine and electrolytes today as he has had a history of AKI in the past.

## 2019-07-10 LAB — URINALYSIS, ROUTINE W REFLEX MICROSCOPIC
Bilirubin Urine: NEGATIVE
Glucose, UA: NEGATIVE
Hgb urine dipstick: NEGATIVE
Hyaline Cast: NONE SEEN /LPF
Nitrite: POSITIVE — AB
Protein, ur: NEGATIVE
RBC / HPF: NONE SEEN /HPF (ref 0–2)
Specific Gravity, Urine: 1.02 (ref 1.001–1.03)
Squamous Epithelial / HPF: NONE SEEN /HPF (ref <=5)
pH: 6 (ref 5.0–8.0)

## 2019-07-10 LAB — URINE CULTURE
MICRO NUMBER:: 985989
SPECIMEN QUALITY:: ADEQUATE

## 2019-07-11 ENCOUNTER — Telehealth: Payer: Self-pay | Admitting: *Deleted

## 2019-07-23 NOTE — Telephone Encounter (Signed)
Initial Assessment Points to Consider for Care             Points are not all inclusive to services and educated provided but supports the patient's Individualized Plan of Care.   Is Home safe for visits? Yes / Are all firearms or weapons secure? yes  Insurance Coverage: Medicare  1st HIV Diagnosis: Nov 1999  Mode of HIV Transmission:Risk factors-MSM or drug use  Functional Status: able to perform ADL's independently with additional time given  Current Housing/Needs: no housing needs at this time, lives in an apartment alone   Social Support/System: Dtr who lives in [place] in [place]) no longer sister(Barbara)  Culture/Religion/Spirituality:none noted that would create barriers to care  Educational Background:   Legal Issues: none noted  Access/Utilization of Community Resources:27% no show rate  Mental Health Concerns/Diagnosis: Schizoaffective disorder  Alcohol and/or Drug Use: Hx of cocaine abuse, current alcohol abuse  Risk and Knowledge of HIV and Reduction in Transmission:  Very knowledgeable able mode of transmission and ways to reduce transmission Nutritional Needs: not at this time    Frequency / Duration of [place] visits: Effective 07/17/2019  81mo4  4PRN's for complications with disease process/progression, medication changes or concerns   [facility] will assess for learning needs related to diagnosis and treatment regimen, provide education as needed, fill pill box if needed, address any barriers which may be preventing medication compliance, and communicating with care team including physician and case manager.   Individualized Plan Of Care with Certification Period from 07/17/19 to 10/15/19             a. Type of service(s) and care to be delivered: RN Case Management             b. Frequency and duration of service: Effective10/22/20  37mo4, 4 prns for complications with disease process/progression, medication changes or concerns . Visits/Contact may be  conducted   telephonically or in person to best suit the patient.             c. Activity restrictions: Pt may be up as tolerated and can safely ambulate without the need for a assistive device. Additional time does need to be given to complete task              d. Safety Measures: Standard Precautions/Infection Control              e. Service Objectives and Goals: Service Objectives are to assist the pt with HIV medication regimen adherence and staying in care with the [facility] by identifying barriers to care. RN will address the barriers that are identified by the patient. Current the patient is capable to taking his own medications but states he would like help and reminders with his care. Patient states he is struggling with alcoholism and thoughts of abusing drugs and would like to have a support person to engage with him. Declines rehab at this time               f. Equipment required: No additional equipment needs at this time              g. Functional Limitations: Vision. Pt has corrective glasses that he wears              h. Rehabilitation potential: Guarded              i. Diet and Nutritional Needs: Regular Diet  j. Medications and treatments: Medications have been reconciled and reviewed and are a part of EPIC electronic file              k. Specific therapies if needed: RN              l. Pertinent diagnoses: HIV disease,  Hx of medication NonCompliance, Schizoaffective disorder             m. Expected outcome: Guarded

## 2019-07-24 ENCOUNTER — Other Ambulatory Visit: Payer: Self-pay | Admitting: Family Medicine

## 2019-07-24 DIAGNOSIS — I10 Essential (primary) hypertension: Secondary | ICD-10-CM

## 2019-07-24 NOTE — Telephone Encounter (Signed)
Plan of care reviewed for [name] Herring as set by [employer].  I completely agree with her plan and appreciate her help.

## 2019-08-11 NOTE — Progress Notes (Signed)
Purpose of this communication is to relay that that the set frequency for home visits this week has been changed due to a missed visit. Communication has been made with the patient to ensure needs have been met and to offer a home visit. At this time a return call has not been received before the business week is out. The [facility] Nurse plans to continue contact with the patient to offer any services or attempt to address any needs the patient expresses that is reasonable. Dr has been contacted and made aware of this change in the plan of care and how the patient's needs have been met

## 2019-08-11 NOTE — Progress Notes (Signed)
Purpose of this communication is to relay that that the set frequency for home visits this week has been changed due to a missed visit. Communication has been made with the patient to ensure needs have been met and to offer a home visit. At this time a return call has not been received before the business week is out. The [facility] Nurse plans to continue contact with the patient to offer any services or attempt to address any needs the patient expresses that is reasonable. Dr has been contacted and made aware of this change in the plan of care and how the patient's needs have been met

## 2019-08-11 NOTE — Progress Notes (Signed)
Purpose of this communication is to relay that that the set frequency for home visits this week has been changed due to a missed visit. Communication has been made with the patient to ensure needs have been met and to offer a home visit. At this time a return call has not been received before the business week is out. The [facility] Nurse plans to continue contact with the patient to offer any services or attempt to address any needs the patient expresses that is reasonable. Dr has been contacted and made aware of this change in the plan of care and how the patient's needs have been met

## 2019-08-11 NOTE — Telephone Encounter (Signed)
RN contacted the patient today. Purpose of the call is to stay connected with the patient. Patient states he is able to urinate and has received his medication that was ordered during his 10/13 visit. Ms. Dudik denies any concerns and confirmed that he has my number in case a need arrives.

## 2019-08-11 NOTE — Progress Notes (Signed)
Purpose of this communication is to relay that that the set frequency for home visits this week has been changed due to a missed visit. Communication has been made with the patient to ensure needs have been met and to offer a home visit. At this time a return call has not been received before the business week is out. The [facility] Nurse plans to continue contact with the patient to offer any services or attempt to address any needs the patient expresses that is reasonable. Dr has been contacted and made aware of this change in the plan of care and how the patient's needs have been met 

## 2019-08-20 ENCOUNTER — Other Ambulatory Visit: Payer: Self-pay | Admitting: Family Medicine

## 2019-08-20 DIAGNOSIS — I10 Essential (primary) hypertension: Secondary | ICD-10-CM

## 2019-08-23 ENCOUNTER — Other Ambulatory Visit: Payer: Self-pay | Admitting: Family Medicine

## 2019-09-04 ENCOUNTER — Emergency Department (HOSPITAL_COMMUNITY)
Admission: EM | Admit: 2019-09-04 | Discharge: 2019-09-04 | Disposition: A | Discharge status: Home / Self Care | Payer: Medicare PPO | Attending: Emergency Medicine | Admitting: Emergency Medicine

## 2019-09-04 ENCOUNTER — Other Ambulatory Visit: Payer: Self-pay

## 2019-09-04 ENCOUNTER — Emergency Department (HOSPITAL_COMMUNITY): Payer: Medicare PPO

## 2019-09-04 ENCOUNTER — Encounter (HOSPITAL_COMMUNITY): Payer: Self-pay | Admitting: Emergency Medicine

## 2019-09-04 DIAGNOSIS — W182XXA Fall in (into) shower or empty bathtub, initial encounter: Secondary | ICD-10-CM | POA: Insufficient documentation

## 2019-09-04 DIAGNOSIS — I10 Essential (primary) hypertension: Secondary | ICD-10-CM | POA: Diagnosis not present

## 2019-09-04 DIAGNOSIS — S01411A Laceration without foreign body of right cheek and temporomandibular area, initial encounter: Secondary | ICD-10-CM | POA: Insufficient documentation

## 2019-09-04 DIAGNOSIS — Z23 Encounter for immunization: Secondary | ICD-10-CM | POA: Diagnosis not present

## 2019-09-04 DIAGNOSIS — Z79899 Other long term (current) drug therapy: Secondary | ICD-10-CM | POA: Insufficient documentation

## 2019-09-04 DIAGNOSIS — S0181XA Laceration without foreign body of other part of head, initial encounter: Secondary | ICD-10-CM

## 2019-09-04 DIAGNOSIS — Y999 Unspecified external cause status: Secondary | ICD-10-CM | POA: Diagnosis not present

## 2019-09-04 DIAGNOSIS — Y92002 Bathroom of unspecified non-institutional (private) residence single-family (private) house as the place of occurrence of the external cause: Secondary | ICD-10-CM | POA: Insufficient documentation

## 2019-09-04 DIAGNOSIS — Z7901 Long term (current) use of anticoagulants: Secondary | ICD-10-CM | POA: Insufficient documentation

## 2019-09-04 DIAGNOSIS — F1010 Alcohol abuse, uncomplicated: Secondary | ICD-10-CM

## 2019-09-04 DIAGNOSIS — W19XXXA Unspecified fall, initial encounter: Secondary | ICD-10-CM

## 2019-09-04 DIAGNOSIS — S0990XA Unspecified injury of head, initial encounter: Secondary | ICD-10-CM | POA: Diagnosis present

## 2019-09-04 DIAGNOSIS — Y9389 Activity, other specified: Secondary | ICD-10-CM | POA: Insufficient documentation

## 2019-09-04 DIAGNOSIS — B20 Human immunodeficiency virus [HIV] disease: Secondary | ICD-10-CM | POA: Diagnosis not present

## 2019-09-04 DIAGNOSIS — Z87891 Personal history of nicotine dependence: Secondary | ICD-10-CM | POA: Diagnosis not present

## 2019-09-04 LAB — BASIC METABOLIC PANEL
Anion gap: 14 (ref 5–15)
BUN: 11 mg/dL (ref 6–20)
CO2: 20 mmol/L — ABNORMAL LOW (ref 22–32)
Calcium: 10.4 mg/dL — ABNORMAL HIGH (ref 8.9–10.3)
Chloride: 110 mmol/L (ref 98–111)
Creatinine, Ser: 1.22 mg/dL (ref 0.61–1.24)
GFR calc Af Amer: >60 mL/min (ref >60)
GFR calc non Af Amer: >60 mL/min (ref >60)
Glucose, Bld: 105 mg/dL — ABNORMAL HIGH (ref 70–99)
Potassium: 3.8 mmol/L (ref 3.5–5.1)
Sodium: 144 mmol/L (ref 135–145)

## 2019-09-04 LAB — CBC
HCT: 47.4 % (ref 39.0–52.0)
Hemoglobin: 16.4 g/dL (ref 13.0–17.0)
MCH: 34.5 pg — ABNORMAL HIGH (ref 26.0–34.0)
MCHC: 34.6 g/dL (ref 30.0–36.0)
MCV: 99.6 fL (ref 80.0–100.0)
Platelets: 149 10*3/uL — ABNORMAL LOW (ref 150–400)
RBC: 4.76 MIL/uL (ref 4.22–5.81)
RDW: 14.1 % (ref 11.5–15.5)
WBC: 6.7 10*3/uL (ref 4.0–10.5)
nRBC: 0 % (ref 0.0–0.2)

## 2019-09-04 MED ORDER — BACITRACIN ZINC 500 UNIT/GM EX OINT
TOPICAL_OINTMENT | Freq: Once | CUTANEOUS | Status: AC
  Administered 2019-09-04: 1 via TOPICAL
  Filled 2019-09-04: qty 0.9

## 2019-09-04 MED ORDER — CHLORDIAZEPOXIDE HCL 25 MG PO CAPS: ORAL_CAPSULE | ORAL | 0 refills | Status: DC

## 2019-09-04 MED ORDER — TETANUS-DIPHTH-ACELL PERTUSSIS 5-2.5-18.5 LF-MCG/0.5 IM SUSP
0.5000 mL | Freq: Once | INTRAMUSCULAR | Status: AC
  Administered 2019-09-04: 0.5 mL via INTRAMUSCULAR
  Filled 2019-09-04: qty 0.5

## 2019-09-04 MED ORDER — LORAZEPAM 2 MG/ML IJ SOLN
1.0000 mg | Freq: Once | INTRAMUSCULAR | Status: AC
  Administered 2019-09-04: 1 mg via INTRAVENOUS
  Filled 2019-09-04: qty 1

## 2019-09-04 MED ORDER — SODIUM CHLORIDE 0.9 % IV BOLUS
1000.0000 mL | Freq: Once | INTRAVENOUS | Status: AC
  Administered 2019-09-04: 1000 mL via INTRAVENOUS

## 2019-09-04 NOTE — Discharge Instructions (Addendum)
I have provided you with some Librium to help with your alcohol.  Do not drink while taking this medication.  Take this in the morning and do not take it at the same time as her Seroquel.  Keep the wound clean and dry for the first 24 hours. After that you may gently clean the wound with soap and water. Make sure to pat dry the wound before covering it with any dressing. You can use topical antibiotic ointment and bandage. Ice and elevate for pain relief.   You can take Tylenol or Ibuprofen as directed for pain. You can alternate Tylenol and Ibuprofen every 4 hours for additional pain relief.   Return to the Emergency Department, your primary care doctor, or the [facility] in 5-7 days for suture removal.   Monitor closely for any signs of infection. Return to the Emergency Department for any worsening redness/swelling of the area that begins to spread, drainage from the site, worsening pain, fever or any other worsening or concerning symptoms.

## 2019-09-04 NOTE — ED Triage Notes (Signed)
Pt reports slipping in the tub and cut the side of his face. States he did black out for a few seconds. Pt on eliquis.

## 2019-09-04 NOTE — ED Provider Notes (Signed)
[facility] DEPARTMENT Provider Note   CSN: [id number] Arrival date & time: 09/04/19  1704     History Chief Complaint  Patient presents with  . Laceration  . Fall    [name] Herring is a 58 y.o. male past medical history of depression, HIV, hypertension, PE (currently on Eliquis), schizophrenia who presents for evaluation of facial laceration after mechanical fall that occurred about 4 PM this afternoon.  Patient reports that he was in the bathtub and states that he slipped, causing him to fall.  He states that when he fell, he hit hit the right side of his face on the sink.  He does think that he had LOC.  He states he had no preceding chest pain or dizziness that caused him to fall.  Patient reports he is currently on Eliquis for PE treatment.  He states that he has been on it for about 3 years.  Since then, he has not had any nausea/vomiting, numbness/weakness of his arms or legs.  Denies any vision changes, abdominal pain.  Patient also reports that he has history of alcohol abuse.  He states he drinks about a gallon of scotch every 2 days.  His last drink was about 3 days ago.  He states that he has never gone into seizures from withdrawal but has had shakes.  He states that he would like to get clean.  He denies any SI, HI.  Denies any other drug use.    The history is provided by the patient.       Past Medical History:  Diagnosis Date  . Depression   . HIV (human immunodeficiency virus infection) ([place])   . Hypertension   . Immune deficiency disorder ([place])   . PE (pulmonary thromboembolism) ([place]) 2018  . Personality disorder ([place])   . Schizophrenia [facility])     Patient Active Problem List   Diagnosis Date Noted  . Urinary retention 07/09/2019  . GERD with esophagitis 01/22/2019  . Protein C deficiency ([place]) 07/05/2017  . Benign neoplasm of transverse colon   . Unintentional weight loss 06/14/2017  . Emphysema lung ([place]) 11/20/2016  . DVT of  lower extremity, bilateral ([place]) 11/04/2016  . Tobacco abuse 11/02/2016  . PE (pulmonary thromboembolism) ([place]) 11/02/2016  . HTN (hypertension) 08/25/2015  . Alcohol use disorder, severe, in early remission ([place]) 12/27/2014  . Human immunodeficiency virus (HIV) disease ([place]) 11/09/2006  . CA IN SITU, RECTUM 11/09/2006  . Hypothyroidism 11/09/2006  . Paranoid schizophrenia ([place]) 11/09/2006  . DISORDER, BIPOLAR NOS 11/09/2006  . Depression 11/09/2006    Past Surgical History:  Procedure Laterality Date  . COLONOSCOPY WITH PROPOFOL N/A 06/15/2017   Procedure: COLONOSCOPY WITH PROPOFOL;  Surgeon: Irene Shipper, MD;  Location: [facility] ENDOSCOPY;  Service: Endoscopy;  Laterality: N/A;  . ESOPHAGOGASTRODUODENOSCOPY (EGD) WITH PROPOFOL N/A 06/15/2017   Procedure: ESOPHAGOGASTRODUODENOSCOPY (EGD) WITH PROPOFOL;  Surgeon: Irene Shipper, MD;  Location: [facility] ENDOSCOPY;  Service: Endoscopy;  Laterality: N/A;  . IR GENERIC HISTORICAL  11/04/2016   IR IVC FILTER PLMT / S&I Burke Keels GUID/MOD SED 11/04/2016 Aletta Edouard, MD [facility]-INTERV RAD       Family History  Problem Relation Age of Onset  . CAD Mother   . Kidney disease Sister   . Lupus Brother   . Cancer Maternal Grandmother   . Stroke Paternal Grandmother     Social History   Tobacco Use  . Smoking status: Former Smoker    Packs/day: 1.00  Years: 33.00    Pack years: 33.00    Types: Cigarettes    Quit date: 04/25/2019    Years since quitting: 0.3  . Smokeless tobacco: Never Used  Substance Use Topics  . Alcohol use: Not Currently    Comment: quit 2020  . Drug use: Not Currently    Comment: quit using 2020    Home Medications Prior to Admission medications   Medication Sig Start Date End Date Taking? Authorizing Provider  acetaminophen (TYLENOL) 500 MG tablet Take 1 tablet (500 mg total) by mouth every 6 (six) hours as needed for moderate pain. Patient taking differently: Take 1,000 mg by mouth every 6 (six) hours as needed for moderate  pain.  11/07/16  Yes Eugenie Filler, MD  apixaban (ELIQUIS) 5 MG TABS tablet Take 1 tablet (5 mg total) by mouth 2 (two) times daily. 01/22/19  Yes Charlott Rakes, MD  benztropine (COGENTIN) 1 MG tablet 1 tab in the morning 2 tabs at night Patient taking differently: Take 1-2 mg by mouth See admin instructions. Take 1 tablet in the morning and take 2 tablets at night 11/10/16  Yes McClung, Angela M, PA-C  bictegravir-emtricitabine-tenofovir AF (BIKTARVY) 50-200-25 MG TABS tablet TAKE ONE TABLET BY MOUTH DAILY. TRY TO TAKE AT THE SAME TIME EACH DAY WITH OR WITHOUT FOOD. Patient taking differently: Take 1 tablet by mouth daily.  06/10/19  Yes [place] Callas, NP  cetirizine (ZYRTEC) 10 MG tablet Take 1 tablet (10 mg total) by mouth daily. 02/26/18  Yes Charlott Rakes, MD  haloperidol (HALDOL) 10 MG tablet Take 0.5 tablets (5 mg total) by mouth 2 (two) times daily. 06/18/17  Yes Domenic Polite, MD  INCRUSE ELLIPTA 62.5 MCG/INH AEPB INHALE 1 PUFF into lungs DAILY Patient taking differently: Inhale 1 puff into the lungs daily.  08/25/19  Yes [place], Charlane Ferretti, MD  lisinopril (ZESTRIL) 40 MG tablet Take 40 mg by mouth daily. 07/30/19  Yes [provider]  magnesium oxide (MAG-OX) 400 (241.3 Mg) MG tablet Take 1 tablet (400 mg total) by mouth 2 (two) times daily. 11/07/16  Yes Eugenie Filler, MD  Melatonin 3-10 MG TABS Take 1 tablet by mouth at bedtime. 06/25/19  Yes [provider]  metoprolol tartrate (LOPRESSOR) 25 MG tablet Take 1 tablet (25 mg total) by mouth 2 (two) times daily. 08/07/18  Yes Charlott Rakes, MD  mirtazapine (REMERON) 30 MG tablet Take 1 tablet (30 mg total) by mouth at bedtime. 06/20/17  Yes [place] Callas, NP  naltrexone (DEPADE) 50 MG tablet Take 25 mg by mouth daily. 08/22/19  Yes [provider]  NICORETTE STARTER KIT 2 MG gum Take 2 mg by mouth every 2 (two) hours as needed. 05/21/19  Yes [provider]  pantoprazole (PROTONIX) 40 MG  tablet Take 40 mg by mouth daily.  05/29/19  Yes [provider]  polyethylene glycol (MIRALAX / GLYCOLAX) packet Take 17 g by mouth daily as needed for moderate constipation.    Yes [provider]  QUEtiapine (SEROQUEL) 400 MG tablet Take 400 mg by mouth at bedtime.  03/27/19  Yes [provider]  tamsulosin (FLOMAX) 0.4 MG CAPS capsule Take 1 capsule (0.4 mg total) by mouth daily after supper. 07/08/19  Yes [place] Callas, NP  tiotropium (SPIRIVA) 18 MCG inhalation capsule Place 1 capsule (18 mcg total) into inhaler and inhale daily. 01/22/19  Yes Charlott Rakes, MD  traZODone (DESYREL) 50 MG tablet Take 1 tablet (50 mg total) by mouth  at bedtime. 08/07/18  Yes Charlott Rakes, MD  Valbenazine Tosylate (INGREZZA) 80 MG CAPS Take 80 mg by mouth 2 (two) times daily.    Yes [provider]  butalbital-acetaminophen-caffeine (ESGIC) 50-325-40 MG tablet Take 1 tablet by mouth 2 (two) times daily as needed for headache. Patient not taking: Reported on 09/04/2019 02/26/18   Charlott Rakes, MD  chlordiazePOXIDE (LIBRIUM) 25 MG capsule 32m PO TID x 1D, then 25-577mPO BID X 1D, then 25-504mO QD X 1D 09/04/19   [place] Lanius PA-C  Multiple Vitamin (MULTIVITAMIN WITH MINERALS) TABS tablet Take 1 tablet by mouth daily. Patient not taking: Reported on 09/04/2019 11/08/16   ThoEugenie FillerD  pantoprazole (PROTONIX) 20 MG tablet Take 1 tablet (20 mg total) by mouth daily at 6 PM. Take 30 minutes before dinner on an empty stomach. Patient not taking: Reported on 09/04/2019 04/11/19   HatCampbell RichesD    Allergies    Amoxicillin, Latex, and Abilify [aripiprazole]  Review of Systems   Review of Systems  Constitutional: Negative for fever.  Eyes: Negative for visual disturbance.  Respiratory: Negative for cough and shortness of breath.   Cardiovascular: Negative for chest pain.  Gastrointestinal: Negative for abdominal pain, nausea and vomiting.   Genitourinary: Negative for hematuria.  Musculoskeletal: Negative for neck pain.  Skin: Positive for wound.  Neurological: Negative for weakness, numbness and headaches.  All other systems reviewed and are negative.   Physical Exam Updated Vital Signs BP (!) 174/127   Pulse 79   Temp 99.1 F (37.3 C) (Oral)   Resp (!) 26   Ht 5' 9.5" (1.765 m)   Wt 65.8 kg   SpO2 100%   BMI 21.11 kg/m   Physical Exam Vitals and nursing note reviewed.  Constitutional:      Appearance: Normal appearance. He is well-developed.  HENT:     Head: Normocephalic and atraumatic.      Comments: 3.5 cm crescent-shaped laceration noted to the right cheek.  Tenderness palpation noted to the right cheek and zygomatic arch. Eyes:     General: Lids are normal.     Conjunctiva/sclera: Conjunctivae normal.     Pupils: Pupils are equal, round, and reactive to light.     Comments: PERRL. EOMs intact. No nystagmus. No neglect.   Neck:     Comments: Full flexion/extension and lateral movement of neck fully intact. No bony midline tenderness. No deformities or crepitus.  Cardiovascular:     Rate and Rhythm: Regular rhythm. Tachycardia present.     Pulses: Normal pulses.          Radial pulses are 2+ on the right side and 2+ on the left side.     Heart sounds: Normal heart sounds. No murmur. No friction rub. No gallop.   Pulmonary:     Effort: Pulmonary effort is normal.     Breath sounds: Normal breath sounds.     Comments: Lungs clear to auscultation bilaterally.  Symmetric chest rise.  No wheezing, rales, rhonchi. Abdominal:     Palpations: Abdomen is soft. Abdomen is not rigid.     Tenderness: There is no abdominal tenderness. There is no guarding.     Comments: Abdomen is soft, non-distended, non-tender. No rigidity, No guarding. No peritoneal signs.  Musculoskeletal:        General: Normal range of motion.     Cervical back: Full passive range of motion without pain.  Skin:    General: Skin is  warm and dry.     Capillary Refill: Capillary refill takes less than 2 seconds.  Neurological:     Mental Status: He is alert and oriented to person, place, and time.     Comments: Cranial nerves III-XII intact Follows commands, Moves all extremities  5/5 strength to BUE and BLE  Sensation intact throughout all major nerve distributions Normal finger to nose. No dysdiadochokinesia. No pronator drift. No gait abnormalities  No slurred speech. No facial droop.   Psychiatric:        Speech: Speech normal.     ED Results / Procedures / Treatments   Labs (all labs ordered are listed, but only abnormal results are displayed) Labs Reviewed  BASIC METABOLIC PANEL - Abnormal; Notable for the following components:      Result Value   CO2 20 (*)    Glucose, Bld 105 (*)    Calcium 10.4 (*)    All other components within normal limits  CBC - Abnormal; Notable for the following components:   MCH 34.5 (*)    Platelets 149 (*)    All other components within normal limits    EKG EKG Interpretation  Date/Time:  Thursday September 04 2019 17:13:58 EST Ventricular Rate:  114 PR Interval:  134 QRS Duration: 92 QT Interval:  336 QTC Calculation: 463 R Axis:   62 Text Interpretation: Sinus tachycardia Right atrial enlargement Moderate voltage criteria for LVH, may be normal variant ( Sokolow-Lyon , Cornell product ) Borderline ECG When compared to prior, more artifact and faster rate. no STEMI Confirmed by Antony Blackbird [phone]) on 09/04/2019 9:50:07 PM   Radiology CT Head Wo Contrast  Result Date: 09/04/2019 CLINICAL DATA:  Status post fall. Assess for nasal fracture. EXAM: CT HEAD WITHOUT CONTRAST CT MAXILLOFACIAL WITHOUT CONTRAST TECHNIQUE: Multidetector CT imaging of the head and maxillofacial structures were performed using the standard protocol without intravenous contrast. Multiplanar CT image reconstructions of the maxillofacial structures were also generated. COMPARISON:  None.  FINDINGS: CT HEAD FINDINGS Brain: No evidence of acute infarction, hemorrhage, hydrocephalus, extra-axial collection or mass lesion/mass effect. Vascular: No hyperdense vessel is noted. Skull: Normal. Negative for fracture or focal lesion. Other: None. CT MAXILLOFACIAL FINDINGS Osseous: There is no acute fracture or dislocation. Specifically, no fracture or dislocation is identified in the nasal bones. Orbits: Negative. No traumatic or inflammatory finding. Sinuses: Left maxillary sinus retention cyst is identified. Mucoperiosteal thickening of the right maxillary sinus is noted. There is no evidence of sinusitis. Soft tissues: Negative. IMPRESSION: 1. No focal acute intracranial abnormality identified. 2. No acute fracture or dislocation of maxillofacial bones. No nasal bone fracture. Electronically Signed   By: Abelardo Diesel M.D.   On: 09/04/2019 20:43   CT Maxillofacial Wo Contrast  Result Date: 09/04/2019 CLINICAL DATA:  Status post fall. Assess for nasal fracture. EXAM: CT HEAD WITHOUT CONTRAST CT MAXILLOFACIAL WITHOUT CONTRAST TECHNIQUE: Multidetector CT imaging of the head and maxillofacial structures were performed using the standard protocol without intravenous contrast. Multiplanar CT image reconstructions of the maxillofacial structures were also generated. COMPARISON:  None. FINDINGS: CT HEAD FINDINGS Brain: No evidence of acute infarction, hemorrhage, hydrocephalus, extra-axial collection or mass lesion/mass effect. Vascular: No hyperdense vessel is noted. Skull: Normal. Negative for fracture or focal lesion. Other: None. CT MAXILLOFACIAL FINDINGS Osseous: There is no acute fracture or dislocation. Specifically, no fracture or dislocation is identified in the nasal bones. Orbits: Negative. No traumatic or inflammatory finding. Sinuses: Left maxillary sinus retention cyst is identified. Mucoperiosteal thickening  of the right maxillary sinus is noted. There is no evidence of sinusitis. Soft tissues:  Negative. IMPRESSION: 1. No focal acute intracranial abnormality identified. 2. No acute fracture or dislocation of maxillofacial bones. No nasal bone fracture. Electronically Signed   By: Abelardo Diesel M.D.   On: 09/04/2019 20:43    Procedures .Marland KitchenLaceration Repair  Date/Time: 09/04/2019 10:30 PM Performed by: Volanda Napoleon, PA-C Authorized by: Volanda Napoleon, PA-C   Consent:    Consent obtained:  Verbal   Consent given by:  Patient   Risks discussed:  Infection, need for additional repair, pain, poor cosmetic result and poor wound healing   Alternatives discussed:  No treatment and delayed treatment Universal protocol:    Procedure explained and questions answered to patient or proxy's satisfaction: yes     Relevant documents present and verified: yes     Test results available and properly labeled: yes     Imaging studies available: yes     Required blood products, implants, devices, and special equipment available: yes     Site/side marked: yes     Immediately prior to procedure, a time out was called: yes     Patient identity confirmed:  Verbally with patient Anesthesia (see MAR for exact dosages):    Anesthesia method:  Local infiltration   Local anesthetic:  Lidocaine 2% WITH epi Laceration details:    Location:  Face   Face location:  R cheek   Length (cm):  3.5 Repair type:    Repair type:  Intermediate Pre-procedure details:    Preparation:  Patient was prepped and draped in usual sterile fashion Exploration:    Hemostasis achieved with:  Direct pressure   Wound exploration: wound explored through full range of motion     Wound extent: no foreign bodies/material noted   Treatment:    Area cleansed with:  Betadine   Amount of cleaning:  Extensive   Irrigation solution:  Sterile saline   Irrigation method:  Syringe   Visualized foreign bodies/material removed: no   Skin repair:    Repair method:  Sutures   Suture size:  5-0   Suture material:  Nylon    Suture technique:  Simple interrupted   Number of sutures:  5 Approximation:    Approximation:  Close Post-procedure details:    Dressing:  Antibiotic ointment and non-adherent dressing   Patient tolerance of procedure:  Tolerated well, no immediate complications Comments:     Once the wound was anesthetized, surrounding since he irrigated with sterile saline.  No evidence of foreign bodies.  EOMs were evaluated before and after laceration repair with no abnormal movements.  Laceration repaired as documented above.   (including critical care time)  Medications Ordered in ED Medications  sodium chloride 0.9 % bolus 1,000 mL (1,000 mLs Intravenous New Bag/Given 09/04/19 1924)  LORazepam (ATIVAN) injection 1 mg (1 mg Intravenous Given 09/04/19 2017)  Tdap (BOOSTRIX) injection 0.5 mL (0.5 mLs Intramuscular Given 09/04/19 2302)  bacitracin ointment (1 application Topical Given 09/04/19 2303)    ED Course  I have reviewed the triage vital signs and the nursing notes.  Pertinent labs & imaging results that were available during my care of the patient were reviewed by me and considered in my medical decision making (see chart for details).    MDM Rules/Calculators/A&P       58 year old male with past history of HIV, depression, PE currently on Eliquis who presents for evaluation of fall.  He reports that  he was in the shower and states that he slipped and fell, causing him to hit his head against the sink.  He does report positive LOC.  He states he sustained a laceration to his face.  He thinks his tetanus may have been up-to-date but he is unsure.  Initially arrival, he is afebrile, he is slightly tachycardic but vitals otherwise stable.  He does not have any signs of tremor or anything that would be concerned about him clinically withdrawing.  He does report that he has a history of alcohol abuse and states that he wants to quit.  Denies any SI, HI.  On exam, he has a 3.5 cm crescent-shaped  laceration noted to his cheek with some tenderness noted.  Normal neuro exam with no deficits.  Plan to check labs, CT head, CT maxillofacial.  Plan for wound care, repair.  CBC shows no leukocytosis or anemia.  BMP is unremarkable.  CT head shows no evidence of acute intracranial abnormality.  CT maxillofacial shows no evidence of any facial injury or fracture.  Laceration repaired as documented above.  Patient tolerated procedure well.  Encouraged at home supportive care measures.  Vitals improved after Ativan.  At this time, he is resting comfortably in bed.  No signs of distress or tremors that would make me concerned that he is in with acute withdrawals.  Again he denies any SI, HI.  He does wish to quit.  At this time, feel that he is appropriate for outpatient management.  We will give him Librium taper and give him outpatient resources. At this time, patient exhibits no emergent life-threatening condition that require further evaluation in ED or admission. Patient had ample opportunity for questions and discussion. All patient's questions were answered with full understanding. Strict return precautions discussed. Patient expresses understanding and agreement to plan.   Portions of this note were generated with Lobbyist. Dictation errors may occur despite best attempts at proofreading.   Final Clinical Impression(s) / ED Diagnoses Final diagnoses:  Facial laceration, initial encounter  Fall, initial encounter  Alcohol abuse    Rx / DC Orders ED Discharge Orders         Ordered    chlordiazePOXIDE (LIBRIUM) 25 MG capsule     09/04/19 2251           Volanda Napoleon, PA-C 09/04/19 2320    Tegeler, Gwenyth Allegra, MD 09/04/19 2340

## 2019-09-13 ENCOUNTER — Other Ambulatory Visit: Payer: Self-pay | Admitting: Family Medicine

## 2019-09-13 DIAGNOSIS — I10 Essential (primary) hypertension: Secondary | ICD-10-CM

## 2019-09-17 ENCOUNTER — Other Ambulatory Visit: Payer: Self-pay | Admitting: *Deleted

## 2019-09-17 DIAGNOSIS — R339 Retention of urine, unspecified: Secondary | ICD-10-CM

## 2019-09-17 MED ORDER — TAMSULOSIN HCL 0.4 MG PO CAPS: 0.4000 mg | ORAL_CAPSULE | Freq: Every day | ORAL | 5 refills | Status: DC

## 2019-09-17 NOTE — Progress Notes (Signed)
Sent per Janene Madeira, NP. Landis Gandy, RN

## 2019-09-23 ENCOUNTER — Other Ambulatory Visit: Payer: Self-pay

## 2019-09-23 NOTE — Progress Notes (Signed)
Home visit made with [name] Herring today. At this time he has c/o bladder retention. [name] Herring, denies bleeding, dizziness, N/V, change in LOC but states he is having some pain and can partially empty his bladder but feels it is not complete. Call placed to [facility] with a visit and transportation arranged for today.

## 2019-09-25 ENCOUNTER — Telehealth: Payer: Self-pay | Admitting: *Deleted

## 2019-09-25 NOTE — Telephone Encounter (Signed)
RN contacted the patient today. Purpose of the call is to stay connected with the patient. [name] Herring stated everything is going well, he is currently shopping at [place]. I advised [name] Herring that I am here if he needs me and to please reach out. [name] Herring thanked me for my call. Next appt is not until 01/14 and I would like to remind him closer to the appt date and time

## 2019-10-08 ENCOUNTER — Emergency Department (HOSPITAL_COMMUNITY)
Admission: EM | Admit: 2019-10-08 | Discharge: 2019-10-09 | Disposition: A | Discharge status: Home / Self Care | Payer: Medicare PPO | Attending: Emergency Medicine | Admitting: Emergency Medicine

## 2019-10-08 ENCOUNTER — Other Ambulatory Visit: Payer: Self-pay

## 2019-10-08 ENCOUNTER — Emergency Department (HOSPITAL_COMMUNITY): Payer: Medicare PPO

## 2019-10-08 DIAGNOSIS — Z91040 Latex allergy status: Secondary | ICD-10-CM | POA: Diagnosis not present

## 2019-10-08 DIAGNOSIS — Z7901 Long term (current) use of anticoagulants: Secondary | ICD-10-CM | POA: Insufficient documentation

## 2019-10-08 DIAGNOSIS — Z79899 Other long term (current) drug therapy: Secondary | ICD-10-CM | POA: Diagnosis not present

## 2019-10-08 DIAGNOSIS — Z21 Asymptomatic human immunodeficiency virus [HIV] infection status: Secondary | ICD-10-CM | POA: Insufficient documentation

## 2019-10-08 DIAGNOSIS — R0789 Other chest pain: Secondary | ICD-10-CM | POA: Diagnosis present

## 2019-10-08 DIAGNOSIS — I10 Essential (primary) hypertension: Secondary | ICD-10-CM | POA: Insufficient documentation

## 2019-10-08 DIAGNOSIS — R072 Precordial pain: Secondary | ICD-10-CM | POA: Diagnosis not present

## 2019-10-08 DIAGNOSIS — Z87891 Personal history of nicotine dependence: Secondary | ICD-10-CM | POA: Diagnosis not present

## 2019-10-08 LAB — CBC
HCT: 50.2 % (ref 39.0–52.0)
Hemoglobin: 16.8 g/dL (ref 13.0–17.0)
MCH: 35.6 pg — ABNORMAL HIGH (ref 26.0–34.0)
MCHC: 33.5 g/dL (ref 30.0–36.0)
MCV: 106.4 fL — ABNORMAL HIGH (ref 80.0–100.0)
Platelets: 153 10*3/uL (ref 150–400)
RBC: 4.72 MIL/uL (ref 4.22–5.81)
RDW: 16 % — ABNORMAL HIGH (ref 11.5–15.5)
WBC: 3.9 10*3/uL — ABNORMAL LOW (ref 4.0–10.5)
nRBC: 0.5 % — ABNORMAL HIGH (ref 0.0–0.2)

## 2019-10-08 LAB — BASIC METABOLIC PANEL
Anion gap: 13 (ref 5–15)
BUN: 17 mg/dL (ref 6–20)
CO2: 21 mmol/L — ABNORMAL LOW (ref 22–32)
Calcium: 9.3 mg/dL (ref 8.9–10.3)
Chloride: 110 mmol/L (ref 98–111)
Creatinine, Ser: 1 mg/dL (ref 0.61–1.24)
GFR calc Af Amer: >60 mL/min (ref >60)
GFR calc non Af Amer: >60 mL/min (ref >60)
Glucose, Bld: 114 mg/dL — ABNORMAL HIGH (ref 70–99)
Potassium: 3.7 mmol/L (ref 3.5–5.1)
Sodium: 144 mmol/L (ref 135–145)

## 2019-10-08 LAB — TROPONIN I (HIGH SENSITIVITY): Troponin I (High Sensitivity): 4 ng/L (ref <18)

## 2019-10-08 NOTE — ED Notes (Signed)
Mr Trimbach has stepped outside

## 2019-10-08 NOTE — ED Triage Notes (Signed)
Pt arrives via EMS with reports of CP x2 days. 324 ASA and 1 nitro given by EMS. 18G LFA. Pain from 9 to 5 after nitro. HIV positive.

## 2019-10-09 ENCOUNTER — Telehealth: Payer: Medicare PPO | Admitting: Infectious Diseases

## 2019-10-09 LAB — D-DIMER, QUANTITATIVE: D-Dimer, Quant: <0.27 ug/mL-FEU (ref 0.00–0.50)

## 2019-10-09 LAB — TROPONIN I (HIGH SENSITIVITY): Troponin I (High Sensitivity): 7 ng/L (ref <18)

## 2019-10-09 MED ORDER — ALUM & MAG HYDROXIDE-SIMETH 200-200-20 MG/5ML PO SUSP
30.0000 mL | Freq: Once | ORAL | Status: AC
  Administered 2019-10-09: 30 mL via ORAL
  Filled 2019-10-09: qty 30

## 2019-10-09 NOTE — ED Provider Notes (Signed)
[facility] EMERGENCY DEPARTMENT Provider Note   CSN: [id number] Arrival date & time: 10/08/19  1534     History Chief Complaint  Patient presents with  . Chest Pain    [name] Herring is a 59 y.o. male.  The history is provided by the patient.  Chest Pain Associated symptoms: cough, diaphoresis and shortness of breath     HPI: A 59 year old patient with a history of hypertension presents for evaluation of chest pain. Initial onset of pain was more than 6 hours ago. The patient's chest pain is described as heaviness/pressure/tightness and is not worse with exertion. The patient complains of nausea and reports some diaphoresis. The patient's chest pain is middle- or left-sided, is not well-localized, is not sharp and does not radiate to the arms/jaw/neck. The patient has smoked in the past 90 days. The patient has no history of stroke, has no history of peripheral artery disease, denies any history of treated diabetes, has no relevant family history of coronary artery disease (first degree relative at less than age 72), has no history of hypercholesterolemia and does not have an elevated BMI (>=30).  Patient history of depression, HIV, PE on Eliquis presents with chest pain. He reports he has had chest pain for the past several days.  Feels like someone is pushing or heaviness on his chest.  He also has some pain with breathing.  He reports chronic shortness of breath and cough due to COPD.  He also reports diaphoresis and dizziness with worsening chest pain Patient has been given aspirin nitroglycerin by EMS. He reports his HIV is well controlled Past Medical History:  Diagnosis Date  . Depression   . HIV (human immunodeficiency virus infection) ([place])   . Hypertension   . Immune deficiency disorder ([place])   . PE (pulmonary thromboembolism) ([place]) 2018  . Personality disorder ([place])   . Schizophrenia [facility])     Patient Active Problem List   Diagnosis Date Noted  .  Urinary retention 07/09/2019  . GERD with esophagitis 01/22/2019  . Protein C deficiency ([place]) 07/05/2017  . Benign neoplasm of transverse colon   . Unintentional weight loss 06/14/2017  . Emphysema lung ([place]) 11/20/2016  . DVT of lower extremity, bilateral ([place]) 11/04/2016  . Tobacco abuse 11/02/2016  . PE (pulmonary thromboembolism) ([place]) 11/02/2016  . HTN (hypertension) 08/25/2015  . Alcohol use disorder, severe, in early remission ([place]) 12/27/2014  . Human immunodeficiency virus (HIV) disease ([place]) 11/09/2006  . CA IN SITU, RECTUM 11/09/2006  . Hypothyroidism 11/09/2006  . Paranoid schizophrenia ([place]) 11/09/2006  . DISORDER, BIPOLAR NOS 11/09/2006  . Depression 11/09/2006    Past Surgical History:  Procedure Laterality Date  . COLONOSCOPY WITH PROPOFOL N/A 06/15/2017   Procedure: COLONOSCOPY WITH PROPOFOL;  Surgeon: Irene Shipper, MD;  Location: [facility] ENDOSCOPY;  Service: Endoscopy;  Laterality: N/A;  . ESOPHAGOGASTRODUODENOSCOPY (EGD) WITH PROPOFOL N/A 06/15/2017   Procedure: ESOPHAGOGASTRODUODENOSCOPY (EGD) WITH PROPOFOL;  Surgeon: Irene Shipper, MD;  Location: [facility] ENDOSCOPY;  Service: Endoscopy;  Laterality: N/A;  . IR GENERIC HISTORICAL  11/04/2016   IR IVC FILTER PLMT / S&I Burke Keels GUID/MOD SED 11/04/2016 Aletta Edouard, MD [facility]-INTERV RAD       Family History  Problem Relation Age of Onset  . CAD Mother   . Kidney disease Sister   . Lupus Brother   . Cancer Maternal Grandmother   . Stroke Paternal Grandmother     Social History   Tobacco Use  . Smoking status: Former  Smoker    Packs/day: 1.00    Years: 33.00    Pack years: 33.00    Types: Cigarettes    Quit date: 04/25/2019    Years since quitting: 0.4  . Smokeless tobacco: Never Used  Substance Use Topics  . Alcohol use: Not Currently    Comment: quit 2020  . Drug use: Not Currently    Comment: quit using 2020    Home Medications Prior to Admission medications   Medication Sig Start Date End Date Taking?  Authorizing Provider  acetaminophen (TYLENOL) 500 MG tablet Take 1 tablet (500 mg total) by mouth every 6 (six) hours as needed for moderate pain. Patient taking differently: Take 1,000 mg by mouth every 6 (six) hours as needed for moderate pain.  11/07/16   Eugenie Filler, MD  apixaban (ELIQUIS) 5 MG TABS tablet Take 1 tablet (5 mg total) by mouth 2 (two) times daily. 01/22/19   Charlott Rakes, MD  benztropine (COGENTIN) 1 MG tablet 1 tab in the morning 2 tabs at night Patient taking differently: Take 1-2 mg by mouth See admin instructions. Take 1 tablet in the morning and take 2 tablets at night 11/10/16   Freeman Caldron M, PA-C  bictegravir-emtricitabine-tenofovir AF (BIKTARVY) 50-200-25 MG TABS tablet TAKE ONE TABLET BY MOUTH DAILY. TRY TO TAKE AT THE SAME TIME EACH DAY WITH OR WITHOUT FOOD. Patient taking differently: Take 1 tablet by mouth daily.  06/10/19   [place] Callas, NP  butalbital-acetaminophen-caffeine (ESGIC) 50-325-40 MG tablet Take 1 tablet by mouth 2 (two) times daily as needed for headache. Patient not taking: Reported on 09/04/2019 02/26/18   Charlott Rakes, MD  cetirizine (ZYRTEC) 10 MG tablet Take 1 tablet (10 mg total) by mouth daily. 02/26/18   Charlott Rakes, MD  chlordiazePOXIDE (LIBRIUM) 25 MG capsule 28m PO TID x 1D, then 25-566mPO BID X 1D, then 25-5042mO QD X 1D 09/04/19   LayProvidence Lanius PA-C  haloperidol (HALDOL) 10 MG tablet Take 0.5 tablets (5 mg total) by mouth 2 (two) times daily. 06/18/17   JosDomenic PoliteD  INCRUSE ELLIPTA 62.5 MCG/INH AEPB INHALE 1 PUFF into lungs DAILY Patient taking differently: Inhale 1 puff into the lungs daily.  08/25/19   NewCharlott RakesD  lisinopril (ZESTRIL) 40 MG tablet Take 40 mg by mouth daily. 07/30/19   [provider]  magnesium oxide (MAG-OX) 400 (241.3 Mg) MG tablet Take 1 tablet (400 mg total) by mouth 2 (two) times daily. 11/07/16   ThoEugenie FillerD  Melatonin 3-10 MG TABS Take 1 tablet by mouth at  bedtime. 06/25/19   [provider]  metoprolol tartrate (LOPRESSOR) 25 MG tablet Take 1 tablet (25 mg total) by mouth 2 (two) times daily. 08/07/18   NewCharlott RakesD  mirtazapine (REMERON) 30 MG tablet Take 1 tablet (30 mg total) by mouth at bedtime. 06/20/17   DixR[place] CallasP  Multiple Vitamin (MULTIVITAMIN WITH MINERALS) TABS tablet Take 1 tablet by mouth daily. Patient not taking: Reported on 09/04/2019 11/08/16   ThoEugenie FillerD  naltrexone (DEPADE) 50 MG tablet Take 25 mg by mouth daily. 08/22/19   [provider]  NICORETTE STARTER KIT 2 MG gum Take 2 mg by mouth every 2 (two) hours as needed. 05/21/19   [provider]  pantoprazole (PROTONIX) 20 MG tablet Take 1 tablet (20 mg total) by mouth daily at 6 PM. Take 30 minutes before dinner on an empty stomach. Patient not taking:  Reported on 09/04/2019 04/11/19   Campbell Riches, MD  pantoprazole (PROTONIX) 40 MG tablet Take 40 mg by mouth daily.  05/29/19   [provider]  polyethylene glycol (MIRALAX / GLYCOLAX) packet Take 17 g by mouth daily as needed for moderate constipation.     [provider]  QUEtiapine (SEROQUEL) 400 MG tablet Take 400 mg by mouth at bedtime.  03/27/19   [provider]  tamsulosin (FLOMAX) 0.4 MG CAPS capsule Take 1 capsule (0.4 mg total) by mouth daily after supper. 09/17/19   [place] Callas, NP  tiotropium (SPIRIVA) 18 MCG inhalation capsule Place 1 capsule (18 mcg total) into inhaler and inhale daily. 01/22/19   Charlott Rakes, MD  traZODone (DESYREL) 50 MG tablet Take 1 tablet (50 mg total) by mouth at bedtime. 08/07/18   Charlott Rakes, MD  Valbenazine Tosylate (INGREZZA) 80 MG CAPS Take 80 mg by mouth 2 (two) times daily.     [provider]    Allergies    Amoxicillin, Latex, and Abilify [aripiprazole]  Review of Systems   Review of Systems  Constitutional: Positive for diaphoresis.  Respiratory: Positive for cough and  shortness of breath.   Cardiovascular: Positive for chest pain.  All other systems reviewed and are negative.   Physical Exam Updated Vital Signs BP (!) 171/114 (BP Location: Right Arm)   Pulse 75   Temp 97.6 F (36.4 C) (Oral)   Resp (!) 21   SpO2 98%   Physical Exam CONSTITUTIONAL: Thin appearing, anxious HEAD: Normocephalic/atraumatic EYES: EOMI ENMT: Mucous membranes moist NECK: supple no meningeal signs SPINE/BACK:entire spine nontender CV: S1/S2 noted, no murmurs/rubs/gallops noted LUNGS: Lungs are clear to auscultation bilaterally, no apparent distress ABDOMEN: soft, nontender, no rebound or guarding, bowel sounds noted throughout abdomen GU:no cva tenderness NEURO: Pt is awake/alert/appropriate, moves all extremitiesx4.  No facial droop.   EXTREMITIES: pulses normal/equal, full ROM, no calf tenderness or edema SKIN: warm, color normal PSYCH: Anxious  ED Results / Procedures / Treatments   Labs (all labs ordered are listed, but only abnormal results are displayed) Labs Reviewed  BASIC METABOLIC PANEL - Abnormal; Notable for the following components:      Result Value   CO2 21 (*)    Glucose, Bld 114 (*)    All other components within normal limits  CBC - Abnormal; Notable for the following components:   WBC 3.9 (*)    MCV 106.4 (*)    MCH 35.6 (*)    RDW 16.0 (*)    nRBC 0.5 (*)    All other components within normal limits  D-DIMER, QUANTITATIVE (NOT AT [facility])  TROPONIN I (HIGH SENSITIVITY)  TROPONIN I (HIGH SENSITIVITY)    EKG EKG Interpretation  Date/Time:  Thursday October 09 2019 00:42:08 EST Ventricular Rate:  77 PR Interval:    QRS Duration: 95 QT Interval:  422 QTC Calculation: 478 R Axis:   -18 Text Interpretation: Sinus rhythm Left ventricular hypertrophy Borderline prolonged QT interval Confirmed by Ripley Fraise [phone]) on 10/09/2019 12:47:12 AM   Radiology DG Chest 2 View  Result Date: 10/08/2019 CLINICAL DATA:  Chest pain EXAM:  CHEST - 2 VIEW COMPARISON:  06/06/2019 FINDINGS: The heart size and mediastinal contours are within normal limits. Minimal linear atelectasis within the bilateral lung bases. No focal consolidation, pleural effusion, or pneumothorax. Partially visualized IVC filter. The visualized skeletal structures are unremarkable. IMPRESSION: No active cardiopulmonary disease. Electronically Signed   By: Davina Poke D.O.   On:  10/08/2019 16:14    Procedures Procedures    Medications Ordered in ED Medications  alum & mag hydroxide-simeth (MAALOX/MYLANTA) 200-200-20 MG/5ML suspension 30 mL (30 mLs Oral Given 10/09/19 0114)    ED Course  I have reviewed the triage vital signs and the nursing notes.  Pertinent labs & imaging results that were available during my care of the patient were reviewed by me and considered in my medical decision making (see chart for details).    MDM Rules/Calculators/A&P HEAR Score: 5                    1:25 AM Patient is a poor historian.  He has some symptoms of typical chest pain but also has pleuritic pain.  He also reports symptoms of GERD.  Will give a GI cocktail for now.  We will add on D-dimer to rule out PE.    Patient was monitored for several hours.  D-dimer was negative.  Patient feels improved.  Troponin is negative  no acute EKG changes Patient reports he feels improved for discharge home. This seems appropriate. We discussed strict ER return precautions.  Patient agreeable with plan Final Clinical Impression(s) / ED Diagnoses Final diagnoses:  Precordial pain    Rx / DC Orders ED Discharge Orders    None       Ripley Fraise, MD 10/09/19 [phone]

## 2019-10-09 NOTE — Discharge Instructions (Addendum)

## 2019-10-10 ENCOUNTER — Telehealth: Payer: Self-pay | Admitting: *Deleted

## 2019-10-10 NOTE — Telephone Encounter (Signed)
Initial Assessment Points to Consider for Care             Points are not all inclusive to services and educated provided but supports the patient's Individualized Plan of Care.   Is Home safe for visits? Yes / Are all firearms or weapons secure? yes  Insurance Coverage: Medicare  1st HIV Diagnosis: Nov 1999  Mode of HIV Transmission:Risk factors-MSM or drug use  Functional Status: able to perform ADL's independently with additional time given  Current Housing/Needs: no housing needs at this time, lives in an apartment alone   Social Support/System: Dtr who lives in [place] in [place]) no longer sister(Barbara)  Culture/Religion/Spirituality:none noted that would create barriers to care  Educational Background:   Legal Issues: none noted  Access/Utilization of Community Resources:27% no show rate  Mental Health Concerns/Diagnosis: Schizoaffective disorder  Alcohol and/or Drug Use: Hx of cocaine abuse, current alcohol abuse  Risk and Knowledge of HIV and Reduction in Transmission:  Very knowledgeable able mode of transmission and ways to reduce transmission Nutritional Needs: not at this time    Frequency / Duration of [place] visits: Effective 10/16/2019  79mo4  4PRN's for complications with disease process/progression, medication changes or concerns   [facility] will assess for learning needs related to diagnosis and treatment regimen, provide education as needed, fill pill box if needed, address any barriers which may be preventing medication compliance, and communicating with care team including physician and case manager.   Individualized Plan Of Care with Certification Period from 10/16/19 to 01/14/20             a. Type of service(s) and care to be delivered: RN Case Management             b. Frequency and duration of service: Effective 10/16/19  69mo4, 4 prns for complications with disease process/progression, medication changes or concerns . Visits/Contact may be  conducted   telephonically or in person to best suit the patient.             c. Activity restrictions: Pt may be up as tolerated and can safely ambulate without the need for a assistive device. Additional time does need to be given to complete task              d. Safety Measures: Standard Precautions/Infection Control              e. Service Objectives and Goals: Service Objectives are to assist the pt with HIV medication regimen adherence and staying in care with the Infectious Disease Clinic by identifying barriers to care. RN will address the barriers that are identified by the patient. Current the patient is capable to taking his own medications but states he would like help and reminders with his care. Patient states he is struggling with alcoholism and thoughts of abusing drugs and would like to have a support person to engage with him. Declines rehab at this time               f. Equipment required: No additional equipment needs at this time              g. Functional Limitations: Vision. Pt has corrective glasses that he wears              h. Rehabilitation potential: Guarded              i. Diet and Nutritional Needs: Regular Diet  j. Medications and treatments: Medications have been reconciled and reviewed and are a part of EPIC electronic file              k. Specific therapies if needed: RN              l. Pertinent diagnoses: HIV disease,  Hx of medication NonCompliance, Schizoaffective disorder             m. Expected outcome: Guarded

## 2019-10-13 ENCOUNTER — Other Ambulatory Visit: Payer: Self-pay | Admitting: Family Medicine

## 2019-10-13 DIAGNOSIS — D6859 Other primary thrombophilia: Secondary | ICD-10-CM

## 2019-10-13 DIAGNOSIS — Z86718 Personal history of other venous thrombosis and embolism: Secondary | ICD-10-CM

## 2019-10-16 ENCOUNTER — Other Ambulatory Visit: Payer: Self-pay

## 2019-10-16 ENCOUNTER — Ambulatory Visit (INDEPENDENT_AMBULATORY_CARE_PROVIDER_SITE_OTHER): Payer: Medicare PPO | Admitting: Infectious Diseases

## 2019-10-16 ENCOUNTER — Encounter: Payer: Self-pay | Admitting: Infectious Diseases

## 2019-10-16 DIAGNOSIS — A630 Anogenital (venereal) warts: Secondary | ICD-10-CM | POA: Insufficient documentation

## 2019-10-16 DIAGNOSIS — B20 Human immunodeficiency virus [HIV] disease: Secondary | ICD-10-CM

## 2019-10-16 NOTE — Telephone Encounter (Signed)
Agree with the plan of care outlined by [name] regarding this patient. [name] Herring is grateful for her help as am I. He continues to have waxing and waning issues with alcohol use.

## 2019-10-16 NOTE — Assessment & Plan Note (Addendum)
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 Explained blood test for HSV only tells [place] if someone has had a history of exposure not active lesions.  Discussed proper use of Condylyx - BID x 3d then stop x 4d. Resume BID x 3 d then stop x 4d; continue for max 4 weeks. Will have him come in person to assess at that time. I see "rectal cancer in situ" in his chart but I cannot find any further documentation of this or information regarding treatment. He denies.  We discussed further evaluation with anoscopy. Will see about referring him to [facility] trial vs local [facility] team.

## 2019-10-16 NOTE — Assessment & Plan Note (Signed)
Will continue his Biktarvy - no drug interactions noted. I will schedule him back in 4 weeks to check in on his genital wart treatment and have his HIV labs updated at that time.  He has been very well controlled and I congratulated him today. Reminded him that the CD4 counts can certainly fluctuate based on other things going on outside of his HIV; the important thing is he is on his medication everyday and viral load remains undetectable.

## 2019-10-16 NOTE — Progress Notes (Signed)
Patient Name: [name] Herring  Date of Birth: [birth year]  MRN: [id number]  PCP: Sonia Side., FNP   Patient Active Problem List   Diagnosis Date Noted  . Genital warts 10/16/2019  . GERD with esophagitis 01/22/2019  . Protein C deficiency ([place]) 07/05/2017  . Benign neoplasm of transverse colon   . Emphysema lung ([place]) 11/20/2016  . DVT of lower extremity, bilateral ([place]) 11/04/2016  . Tobacco abuse 11/02/2016  . PE (pulmonary thromboembolism) ([place]) 11/02/2016  . HTN (hypertension) 08/25/2015  . Alcohol use disorder, severe, in early remission ([place]) 12/27/2014  . Human immunodeficiency virus (HIV) disease ([place]) 11/09/2006  . CA IN SITU, RECTUM 11/09/2006  . Hypothyroidism 11/09/2006  . Paranoid schizophrenia ([place]) 11/09/2006  . DISORDER, BIPOLAR NOS 11/09/2006  . Depression 11/09/2006    SUBJECTIVE:  Brief Narrative:  [name] Herring is a 59 y.o. AA male with a history of HIV disease Dx 08/18/1998. Previously followed at [facility].  HIV Risk: MSM.  History of OIs: uncertain. History of ETOH/cocaine abuse, previously homeless.   Previous Regimens:   Kaletra + Combivir   Genvoya > stopped d/t DDI with [facility]  Biktarvy 2019 >> suppressed    CC: Acute visit for urinary retention.    HPI: He noticed a bump about 2 weeks on the outer aspect of anus. It feels firm and irritates him with touch. He does not have any burning with urination or difficulty passing bowel movements. He saw his PCP recently and was given a medication for something but not sure what it is fore. He was also told he has a herpes virus in him.   He has been doing well with his Biktarvy and does not miss any doses. He is worried because his Tcell count is not as high as he would like. Has not had lab work in a while.   Last office he started using Flomax for BPH with good effect and resolution of dysuria symptoms.   He has continued to work with [employer], [employer] operational [employer] and enjoys her coming to see him.    He states that he has not touched alcohol for a month and a half now. ER visit for laceration to the face after he fell in the home. Reported to ER physician in December he drinks a gallon of scotch every 2 days.    Review of Systems  Constitutional: Negative for chills, diaphoresis and fever.  Respiratory: Negative for shortness of breath.   Cardiovascular: Negative for leg swelling.  Gastrointestinal: Negative for abdominal pain and nausea.  Genitourinary: Positive for frequency. Negative for dysuria, flank pain, hematuria and urgency.       Urinary retention as described above  Musculoskeletal: Negative for back pain.  Neurological: Negative for dizziness.     Past Medical History:  Diagnosis Date  . Depression   . HIV (human immunodeficiency virus infection) ([place])   . Hypertension   . Immune deficiency disorder ([place])   . PE (pulmonary thromboembolism) ([place]) 2018  . Personality disorder ([place])   . Schizophrenia [facility])    Outpatient Medications Prior to Visit  Medication Sig Dispense Refill  . acetaminophen (TYLENOL) 500 MG tablet Take 1 tablet (500 mg total) by mouth every 6 (six) hours as needed for moderate pain. (Patient taking differently: Take 1,000 mg by mouth every 6 (six) hours as needed for moderate pain. ) 30 tablet 0  . apixaban (ELIQUIS) 5 MG TABS tablet Take 1 tablet (5 mg total) by mouth 2 (  two) times daily. 180 tablet 1  . benztropine (COGENTIN) 1 MG tablet 1 tab in the morning 2 tabs at night (Patient taking differently: Take 1-2 mg by mouth See admin instructions. Take 1 tablet in the morning and take 2 tablets at night) 14 tablet 0  . bictegravir-emtricitabine-tenofovir AF (BIKTARVY) 50-200-25 MG TABS tablet TAKE ONE TABLET BY MOUTH DAILY. TRY TO TAKE AT THE SAME TIME EACH DAY WITH OR WITHOUT FOOD. (Patient taking differently: Take 1 tablet by mouth daily. ) 90 tablet 3  . butalbital-acetaminophen-caffeine (ESGIC) 50-325-40 MG tablet Take 1 tablet by mouth 2  (two) times daily as needed for headache. 30 tablet 0  . cetirizine (ZYRTEC) 10 MG tablet Take 1 tablet (10 mg total) by mouth daily. 30 tablet 1  . chlordiazePOXIDE (LIBRIUM) 25 MG capsule 6m PO TID x 1D, then 25-535mPO BID X 1D, then 25-5065mO QD X 1D 10 capsule 0  . INCRUSE ELLIPTA 62.5 MCG/INH AEPB INHALE 1 PUFF into lungs DAILY (Patient taking differently: Inhale 1 puff into the lungs daily. ) 90 each 1  . magnesium oxide (MAG-OX) 400 (241.3 Mg) MG tablet Take 1 tablet (400 mg total) by mouth 2 (two) times daily. 60 tablet 0  . Melatonin 3-10 MG TABS Take 1 tablet by mouth at bedtime.    . metoprolol tartrate (LOPRESSOR) 25 MG tablet Take 1 tablet (25 mg total) by mouth 2 (two) times daily. 180 tablet 1  . mirtazapine (REMERON) 30 MG tablet Take 1 tablet (30 mg total) by mouth at bedtime. 30 tablet 3  . naltrexone (DEPADE) 50 MG tablet Take 25 mg by mouth daily.    . NMarland KitchenCORETTE STARTER KIT 2 MG gum Take 2 mg by mouth every 2 (two) hours as needed.    . pantoprazole (PROTONIX) 20 MG tablet Take 1 tablet (20 mg total) by mouth daily at 6 PM. Take 30 minutes before dinner on an empty stomach. 90 tablet 1  . pantoprazole (PROTONIX) 40 MG tablet Take 40 mg by mouth daily.     . polyethylene glycol (MIRALAX / GLYCOLAX) packet Take 17 g by mouth daily as needed for moderate constipation.     . QUEtiapine (SEROQUEL) 400 MG tablet Take 400 mg by mouth at bedtime.     . tamsulosin (FLOMAX) 0.4 MG CAPS capsule Take 1 capsule (0.4 mg total) by mouth daily after supper. 30 capsule 5  . tiotropium (SPIRIVA) 18 MCG inhalation capsule Place 1 capsule (18 mcg total) into inhaler and inhale daily. 90 capsule 1  . traZODone (DESYREL) 50 MG tablet Take 1 tablet (50 mg total) by mouth at bedtime. 30 tablet 3  . Valbenazine Tosylate (INGREZZA) 80 MG CAPS Take 80 mg by mouth 2 (two) times daily.     . haloperidol (HALDOL) 10 MG tablet Take 0.5 tablets (5 mg total) by mouth 2 (two) times daily.    . lMarland Kitchensinopril  (ZESTRIL) 40 MG tablet Take 40 mg by mouth daily.    . Multiple Vitamin (MULTIVITAMIN WITH MINERALS) TABS tablet Take 1 tablet by mouth daily. (Patient not taking: Reported on 09/04/2019)     No facility-administered medications prior to visit.   Allergies  Allergen Reactions  . Amoxicillin Shortness Of Breath and Rash    Has patient had a PCN reaction causing immediate rash, facial/tongue/throat swelling, [name] Has patient had a PCN reaction causing severe rash involving mucus membranes or skin necrosis: no Has patient had a PCN reaction that  required hospitalization: yes drs office visit Has patient had a PCN reaction occurring within the last 10 years: no If all of the above answers are "NO", then may proceed with Cephalosporin use.   . Latex Shortness Of Breath  . Abilify [Aripiprazole] Other (See Comments)    Double vision   Social History   Tobacco Use  . Smoking status: Former Smoker    Packs/day: 1.00    Years: 33.00    Pack years: 33.00    Types: Cigarettes    Quit date: 04/25/2019    Years since quitting: 0.4  . Smokeless tobacco: Never Used  Substance Use Topics  . Alcohol use: Not Currently    Comment: quit 2020  . Drug use: Not Currently    Comment: quit using 2020   Objective:  There were no vitals filed for this visit. There is no height or weight on file to calculate BMI.  Physical Exam Pulmonary:     Effort: Pulmonary effort is normal.     Comments: No shortness of breath detected in conversation.  Neurological:     Mental Status: He is oriented to person, place, and time.  Psychiatric:        Mood and Affect: Mood normal.        Behavior: Behavior normal.        Thought Content: Thought content normal.        Judgment: Judgment normal.      Lab Results Lab Results  Component Value Date   WBC 3.9 (L) 10/08/2019   HGB 16.8 10/08/2019   HCT 50.2 10/08/2019   MCV 106.4 (H) 10/08/2019   PLT 153 10/08/2019     Lab Results  Component Value Date   CREATININE 1.00 10/08/2019   BUN 17 10/08/2019   NA 144 10/08/2019   K 3.7 10/08/2019   CL 110 10/08/2019   CO2 21 (L) 10/08/2019    Lab Results  Component Value Date   ALT 12 06/06/2019   AST 16 06/06/2019   ALKPHOS 41 06/06/2019   BILITOT 0.6 06/06/2019    Lab Results  Component Value Date   CHOL 113 01/22/2019   HDL 56 01/22/2019   LDLCALC 40 01/22/2019   TRIG 86 01/22/2019   CHOLHDL 2.0 01/22/2019   HIV 1 RNA Quant (copies/mL)  Date Value  06/10/2019 <20 DETECTED (A)  01/22/2019 <20 DETECTED (A)  06/17/2018 <20 DETECTED (A)   CD4 T Cell Abs (/uL)  Date Value  01/22/2019 199 (L)  06/17/2018 250 (L)  10/24/2017 290 (L)   Lab Results  Component Value Date   HAV REACTIVE (A) 06/17/2018   Lab Results  Component Value Date   HEPBSAG NON-REACTIVE 06/17/2018   HEPBSAB BORDERLINE (A) 06/17/2018    ASSESSMENT & PLAN:   Problem List Items Addressed This Visit      Unprioritized   Human immunodeficiency virus (HIV) disease ([place]) (Chronic)    Will continue his Biktarvy - no drug interactions noted. I will schedule him back in 4 weeks to check in on his genital wart treatment and have his HIV labs updated at that time.  He has been very well controlled and I congratulated him today. Reminded him that the CD4 counts can certainly fluctuate based on other things going on outside of his HIV; the important thing is he is on his medication everyday and viral load remains undetectable.       Genital warts    Description of his bump today most fitting with  condyloma not active HSV. Explained blood test for HSV only tells [place] if someone has had a history of exposure not active lesions.  Discussed proper use of Condylyx - BID x 3d then stop x 4d. Resume BID x 3 d then stop x 4d; continue for max 4 weeks. Will have him come in person to assess at that time. I see "rectal cancer in situ" in his chart but I cannot find any further  documentation of this or information regarding treatment. He denies.  We discussed further evaluation with anoscopy. Will see about referring him to [facility] trial vs local [facility] team.          Janene Madeira, MSN, NP-C [place] [facility] [email or web address] Pager: [phone] Office: [place]: [phone]    10/16/19

## 2019-10-21 ENCOUNTER — Emergency Department (HOSPITAL_COMMUNITY): Payer: Medicare PPO

## 2019-10-21 ENCOUNTER — Emergency Department (HOSPITAL_COMMUNITY)
Admission: EM | Admit: 2019-10-21 | Discharge: 2019-10-22 | Disposition: A | Discharge status: Home / Self Care | Payer: Medicare PPO | Attending: Emergency Medicine | Admitting: Emergency Medicine

## 2019-10-21 ENCOUNTER — Other Ambulatory Visit: Payer: Self-pay

## 2019-10-21 DIAGNOSIS — Z7901 Long term (current) use of anticoagulants: Secondary | ICD-10-CM | POA: Diagnosis not present

## 2019-10-21 DIAGNOSIS — I10 Essential (primary) hypertension: Secondary | ICD-10-CM | POA: Insufficient documentation

## 2019-10-21 DIAGNOSIS — Z86718 Personal history of other venous thrombosis and embolism: Secondary | ICD-10-CM | POA: Insufficient documentation

## 2019-10-21 DIAGNOSIS — R0602 Shortness of breath: Secondary | ICD-10-CM | POA: Diagnosis present

## 2019-10-21 DIAGNOSIS — Z79899 Other long term (current) drug therapy: Secondary | ICD-10-CM | POA: Insufficient documentation

## 2019-10-21 DIAGNOSIS — N390 Urinary tract infection, site not specified: Secondary | ICD-10-CM | POA: Diagnosis not present

## 2019-10-21 DIAGNOSIS — Z87891 Personal history of nicotine dependence: Secondary | ICD-10-CM | POA: Diagnosis not present

## 2019-10-21 DIAGNOSIS — B20 Human immunodeficiency virus [HIV] disease: Secondary | ICD-10-CM | POA: Insufficient documentation

## 2019-10-21 DIAGNOSIS — E039 Hypothyroidism, unspecified: Secondary | ICD-10-CM | POA: Insufficient documentation

## 2019-10-21 DIAGNOSIS — J441 Chronic obstructive pulmonary disease with (acute) exacerbation: Secondary | ICD-10-CM | POA: Diagnosis not present

## 2019-10-21 LAB — CBC WITH DIFFERENTIAL/PLATELET
Abs Immature Granulocytes: 0.01 10*3/uL (ref 0.00–0.07)
Basophils Absolute: 0 10*3/uL (ref 0.0–0.1)
Basophils Relative: 0 %
Eosinophils Absolute: 0 10*3/uL (ref 0.0–0.5)
Eosinophils Relative: 0 %
HCT: 43.2 % (ref 39.0–52.0)
Hemoglobin: 14.7 g/dL (ref 13.0–17.0)
Immature Granulocytes: 0 %
Lymphocytes Relative: 24 %
Lymphs Abs: 1 10*3/uL (ref 0.7–4.0)
MCH: 35.1 pg — ABNORMAL HIGH (ref 26.0–34.0)
MCHC: 34 g/dL (ref 30.0–36.0)
MCV: 103.1 fL — ABNORMAL HIGH (ref 80.0–100.0)
Monocytes Absolute: 0.6 10*3/uL (ref 0.1–1.0)
Monocytes Relative: 15 %
Neutro Abs: 2.5 10*3/uL (ref 1.7–7.7)
Neutrophils Relative %: 61 %
Platelets: 143 10*3/uL — ABNORMAL LOW (ref 150–400)
RBC: 4.19 MIL/uL — ABNORMAL LOW (ref 4.22–5.81)
RDW: 16.1 % — ABNORMAL HIGH (ref 11.5–15.5)
WBC: 4.1 10*3/uL (ref 4.0–10.5)
nRBC: 0 % (ref 0.0–0.2)

## 2019-10-21 LAB — URINALYSIS, ROUTINE W REFLEX MICROSCOPIC
Bilirubin Urine: NEGATIVE
Glucose, UA: NEGATIVE mg/dL
Hgb urine dipstick: NEGATIVE
Ketones, ur: 5 mg/dL — AB
Leukocytes,Ua: NEGATIVE
Nitrite: POSITIVE — AB
Protein, ur: 30 mg/dL — AB
Specific Gravity, Urine: 1.034 — ABNORMAL HIGH (ref 1.005–1.030)
pH: 5 (ref 5.0–8.0)

## 2019-10-21 LAB — COMPREHENSIVE METABOLIC PANEL
ALT: 14 U/L (ref 0–44)
AST: 21 U/L (ref 15–41)
Albumin: 3.9 g/dL (ref 3.5–5.0)
Alkaline Phosphatase: 51 U/L (ref 38–126)
Anion gap: 9 (ref 5–15)
BUN: 11 mg/dL (ref 6–20)
CO2: 23 mmol/L (ref 22–32)
Calcium: 9.5 mg/dL (ref 8.9–10.3)
Chloride: 106 mmol/L (ref 98–111)
Creatinine, Ser: 0.91 mg/dL (ref 0.61–1.24)
GFR calc Af Amer: >60 mL/min (ref >60)
GFR calc non Af Amer: >60 mL/min (ref >60)
Glucose, Bld: 108 mg/dL — ABNORMAL HIGH (ref 70–99)
Potassium: 3.6 mmol/L (ref 3.5–5.1)
Sodium: 138 mmol/L (ref 135–145)
Total Bilirubin: 0.7 mg/dL (ref 0.3–1.2)
Total Protein: 6.5 g/dL (ref 6.5–8.1)

## 2019-10-21 LAB — LACTIC ACID, PLASMA: Lactic Acid, Venous: 1.2 mmol/L (ref 0.5–1.9)

## 2019-10-21 MED ORDER — SODIUM CHLORIDE 0.9% FLUSH: 3.0000 mL | Freq: Once | INTRAVENOUS | Status: DC

## 2019-10-21 MED ORDER — IPRATROPIUM BROMIDE HFA 17 MCG/ACT IN AERS
2.0000 | INHALATION_SPRAY | Freq: Once | RESPIRATORY_TRACT | Status: AC
  Administered 2019-10-22: 2 via RESPIRATORY_TRACT
  Filled 2019-10-21: qty 12.9

## 2019-10-21 MED ORDER — ALBUTEROL SULFATE HFA 108 (90 BASE) MCG/ACT IN AERS
8.0000 | INHALATION_SPRAY | Freq: Once | RESPIRATORY_TRACT | Status: AC
  Administered 2019-10-22: 8 via RESPIRATORY_TRACT
  Filled 2019-10-21: qty 6.7

## 2019-10-21 NOTE — ED Triage Notes (Signed)
Per EMS pt from home, SOB w/ exertion.  Pt has hx of COPD, is currently 96% on RA.  Pt does have a fever 101.7.  Did try a neb treatment at home but no relief.    148/106 HR 82 R 16  96% RA

## 2019-10-21 NOTE — ED Provider Notes (Signed)
[facility] EMERGENCY DEPARTMENT Provider Note  CSN: [id number] Arrival date & time: 10/21/19 2028  Chief Complaint(s) Shortness of Breath  HPI [name] Herring is a 59 y.o. male with a history of well-controlled HIV, COPD not on supplemental oxygen, PEs on Eliquis who presents to the emergency department with several days of gradually worsening shortness of breath.  No associated chest pain.  No productive sputum.  Has tried taking nebs at home without relief.  Reported fever of 101, but did not take any antipyretics.  Afebrile here.  No abdominal pain.  No nausea or vomiting.  Endorses frequency and mild dysuria.  No other physical complaints.  HPI  Past Medical History Past Medical History:  Diagnosis Date  . Depression   . HIV (human immunodeficiency virus infection) ([place])   . Hypertension   . Immune deficiency disorder ([place])   . PE (pulmonary thromboembolism) ([place]) 2018  . Personality disorder ([place])   . Schizophrenia [facility])    Patient Active Problem List   Diagnosis Date Noted  . Genital warts 10/16/2019  . GERD with esophagitis 01/22/2019  . Protein C deficiency ([place]) 07/05/2017  . Benign neoplasm of transverse colon   . Emphysema lung ([place]) 11/20/2016  . DVT of lower extremity, bilateral ([place]) 11/04/2016  . Tobacco abuse 11/02/2016  . PE (pulmonary thromboembolism) ([place]) 11/02/2016  . HTN (hypertension) 08/25/2015  . Alcohol use disorder, severe, in early remission ([place]) 12/27/2014  . Human immunodeficiency virus (HIV) disease ([place]) 11/09/2006  . CA IN SITU, RECTUM 11/09/2006  . Hypothyroidism 11/09/2006  . Paranoid schizophrenia ([place]) 11/09/2006  . DISORDER, BIPOLAR NOS 11/09/2006  . Depression 11/09/2006   Home Medication(s) Prior to Admission medications   Medication Sig Start Date End Date Taking? Authorizing Provider  acetaminophen (TYLENOL) 500 MG tablet Take 1 tablet (500 mg total) by mouth every 6 (six) hours as needed for moderate  pain. Patient taking differently: Take 1,000 mg by mouth every 6 (six) hours as needed for moderate pain.  11/07/16  Yes Eugenie Filler, MD  apixaban (ELIQUIS) 5 MG TABS tablet Take 1 tablet (5 mg total) by mouth 2 (two) times daily. 01/22/19  Yes Charlott Rakes, MD  benztropine (COGENTIN) 1 MG tablet 1 tab in the morning 2 tabs at night Patient taking differently: Take 1-2 mg by mouth See admin instructions. Take 1 tablet in the morning and take 2 tablets at night 11/10/16  Yes McClung, Angela M, PA-C  bictegravir-emtricitabine-tenofovir AF (BIKTARVY) 50-200-25 MG TABS tablet TAKE ONE TABLET BY MOUTH DAILY. TRY TO TAKE AT THE SAME TIME EACH DAY WITH OR WITHOUT FOOD. Patient taking differently: Take 1 tablet by mouth daily.  06/10/19  Yes [place] Callas, NP  butalbital-acetaminophen-caffeine (ESGIC) 50-325-40 MG tablet Take 1 tablet by mouth 2 (two) times daily as needed for headache. 02/26/18  Yes Newlin, Enobong, MD  fluticasone (FLONASE) 50 MCG/ACT nasal spray Place 1 spray into both nostrils daily as needed for allergies.  10/03/19  Yes [provider]  gabapentin (NEURONTIN) 800 MG tablet Take 800 mg by mouth at bedtime. 10/03/19  Yes [provider]  INCRUSE ELLIPTA 62.5 MCG/INH AEPB INHALE 1 PUFF into lungs DAILY Patient taking differently: Inhale 1 puff into the lungs daily.  08/25/19  Yes Newlin, Charlane Ferretti, MD  lisinopril (ZESTRIL) 40 MG tablet Take 40 mg by mouth daily. 07/30/19  Yes [provider]  magnesium oxide (MAG-OX) 400 (241.3 Mg) MG tablet Take 1 tablet (400 mg total) by mouth 2 (  two) times daily. 11/07/16  Yes Eugenie Filler, MD  Melatonin 3-10 MG TABS Take 1 tablet by mouth at bedtime. 06/25/19  Yes [provider]  metoprolol tartrate (LOPRESSOR) 25 MG tablet Take 1 tablet (25 mg total) by mouth 2 (two) times daily. 08/07/18  Yes Charlott Rakes, MD  mirtazapine (REMERON) 30 MG tablet Take 1 tablet (30 mg total) by mouth at bedtime. 06/20/17   Yes [place] Callas, NP  montelukast (SINGULAIR) 10 MG tablet Take 10 mg by mouth at bedtime. 10/02/19  Yes [provider]  naltrexone (DEPADE) 50 MG tablet Take 25 mg by mouth daily. 08/22/19  Yes [provider]  pantoprazole (PROTONIX) 40 MG tablet Take 40 mg by mouth daily.  05/29/19  Yes [provider]  polyethylene glycol (MIRALAX / GLYCOLAX) packet Take 17 g by mouth daily as needed for moderate constipation.    Yes [provider]  QUEtiapine (SEROQUEL) 400 MG tablet Take 400 mg by mouth at bedtime.  03/27/19  Yes [provider]  SYMBICORT 160-4.5 MCG/ACT inhaler Inhale 2 puffs into the lungs 2 (two) times daily. 10/03/19  Yes [provider]  tamsulosin (FLOMAX) 0.4 MG CAPS capsule Take 1 capsule (0.4 mg total) by mouth daily after supper. 09/17/19  Yes [place] Callas, NP  traZODone (DESYREL) 50 MG tablet Take 1 tablet (50 mg total) by mouth at bedtime. 08/07/18  Yes Charlott Rakes, MD  Valbenazine Tosylate (INGREZZA) 80 MG CAPS Take 80 mg by mouth 2 (two) times daily.    Yes [provider]  cephALEXin (KEFLEX) 500 MG capsule Take 1 capsule (500 mg total) by mouth 3 (three) times daily for 5 days. 10/22/19 10/27/19  Fatima Blank, MD  cetirizine (ZYRTEC) 10 MG tablet Take 1 tablet (10 mg total) by mouth daily. Patient not taking: Reported on 10/22/2019 02/26/18   Charlott Rakes, MD  chlordiazePOXIDE (LIBRIUM) 25 MG capsule 50mg  PO TID x 1D, then 25-50mg  PO BID X 1D, then 25-50mg  PO QD X 1D Patient not taking: Reported on 10/22/2019 09/04/19   [place] Lanius A, PA-C  haloperidol (HALDOL) 10 MG tablet Take 0.5 tablets (5 mg total) by mouth 2 (two) times daily. Patient not taking: Reported on 10/22/2019 06/18/17   Domenic Polite, MD  Multiple Vitamin (MULTIVITAMIN WITH MINERALS) TABS tablet Take 1 tablet by mouth daily. Patient not taking: Reported on 09/04/2019 11/08/16   Eugenie Filler, MD  pantoprazole (PROTONIX) 20  MG tablet Take 1 tablet (20 mg total) by mouth daily at 6 PM. Take 30 minutes before dinner on an empty stomach. Patient not taking: Reported on 10/22/2019 04/11/19   Campbell Riches, MD  predniSONE (DELTASONE) 10 MG tablet Take 4 tablets (40 mg total) by mouth daily for 4 days. 10/22/19 10/26/19  Fatima Blank, MD  tiotropium (SPIRIVA) 18 MCG inhalation capsule Place 1 capsule (18 mcg total) into inhaler and inhale daily. Patient not taking: Reported on 10/22/2019 01/22/19   Charlott Rakes, MD  Past Surgical History Past Surgical History:  Procedure Laterality Date  . COLONOSCOPY WITH PROPOFOL N/A 06/15/2017   Procedure: COLONOSCOPY WITH PROPOFOL;  Surgeon: Irene Shipper, MD;  Location: [facility] ENDOSCOPY;  Service: Endoscopy;  Laterality: N/A;  . ESOPHAGOGASTRODUODENOSCOPY (EGD) WITH PROPOFOL N/A 06/15/2017   Procedure: ESOPHAGOGASTRODUODENOSCOPY (EGD) WITH PROPOFOL;  Surgeon: Irene Shipper, MD;  Location: [facility] ENDOSCOPY;  Service: Endoscopy;  Laterality: N/A;  . IR GENERIC HISTORICAL  11/04/2016   IR IVC FILTER PLMT / S&I Burke Keels GUID/MOD SED 11/04/2016 Aletta Edouard, MD [facility]-INTERV RAD   Family History Family History  Problem Relation Age of Onset  . CAD Mother   . Kidney disease Sister   . Lupus Brother   . Cancer Maternal Grandmother   . Stroke Paternal Grandmother     Social History Social History   Tobacco Use  . Smoking status: Former Smoker    Packs/day: 1.00    Years: 33.00    Pack years: 33.00    Types: Cigarettes    Quit date: 04/25/2019    Years since quitting: 0.4  . Smokeless tobacco: Never Used  Substance Use Topics  . Alcohol use: Not Currently    Comment: quit 2020  . Drug use: Not Currently    Comment: quit using 2020   Allergies Amoxicillin, Latex, and Abilify [aripiprazole]  Review of Systems Review of Systems All other systems  are reviewed and are negative for acute change except as noted in the HPI  Physical Exam Vital Signs  I have reviewed the triage vital signs BP (!) 158/107 (BP Location: Right Arm)   Pulse 73   Temp 98.4 F (36.9 C) (Oral)   Resp 16   SpO2 98%   Physical Exam Vitals reviewed.  Constitutional:      General: He is not in acute distress.    Appearance: He is well-developed. He is not diaphoretic.  HENT:     Head: Normocephalic and atraumatic.     Nose: Nose normal.  Eyes:     General: No scleral icterus.       Right eye: No discharge.        Left eye: No discharge.     Conjunctiva/sclera: Conjunctivae normal.     Pupils: Pupils are equal, round, and reactive to light.  Cardiovascular:     Rate and Rhythm: Normal rate and regular rhythm.     Heart sounds: No murmur. No friction rub. No gallop.   Pulmonary:     Effort: Pulmonary effort is normal. Tachypnea present. No respiratory distress.     Breath sounds: Normal breath sounds. Decreased air movement present. No stridor. No rales.  Abdominal:     General: There is no distension.     Palpations: Abdomen is soft.     Tenderness: There is no abdominal tenderness.  Musculoskeletal:        General: No tenderness.     Cervical back: Normal range of motion and neck supple.  Skin:    General: Skin is warm and dry.     Findings: No erythema or rash.  Neurological:     Mental Status: He is alert and oriented to person, place, and time.     ED Results and Treatments Labs (all labs ordered are listed, but only abnormal results are displayed) Labs Reviewed  COMPREHENSIVE METABOLIC PANEL - Abnormal; Notable for the following components:      Result Value   Glucose, Bld 108 (*)    All other components within normal limits  CBC WITH DIFFERENTIAL/PLATELET - Abnormal; Notable for the following components:   RBC 4.19 (*)    MCV 103.1 (*)    MCH 35.1 (*)    RDW 16.1 (*)    Platelets 143 (*)    All other components within normal  limits  URINALYSIS, ROUTINE W REFLEX MICROSCOPIC - Abnormal; Notable for the following components:   Color, Urine AMBER (*)    APPearance HAZY (*)    Specific Gravity, Urine 1.034 (*)    Ketones, ur 5 (*)    Protein, ur 30 (*)    Nitrite POSITIVE (*)    Bacteria, UA MANY (*)    All other components within normal limits  LACTIC ACID, PLASMA  LACTIC ACID, PLASMA                                                                                                                         EKG  EKG Interpretation  Date/Time:  Tuesday October 21 2019 20:41:42 EST Ventricular Rate:  78 PR Interval:  136 QRS Duration: 94 QT Interval:  390 QTC Calculation: 444 R Axis:   62 Text Interpretation: Normal sinus rhythm Moderate voltage criteria for LVH, may be normal variant ( Sokolow-Lyon , Cornell product ) Borderline ECG No significant change since last tracing Confirmed by Merrily Pew [id number]) on 10/21/2019 11:03:07 PM      Radiology DG Chest Portable 1 View  Result Date: 10/21/2019 CLINICAL DATA:  Shortness of breath EXAM: PORTABLE CHEST 1 VIEW COMPARISON:  10/08/2019, 06/06/2019 FINDINGS: Emphysematous disease. Chronic bronchitic changes. No consolidation, pleural effusion or pneumothorax. Scarring at the bases. Borderline cardiomegaly. No pneumothorax. IMPRESSION: Hyperinflation with emphysematous disease, chronic bronchitic changes and scarring at the bases. No acute airspace disease is visualized. Electronically Signed   By: Donavan Foil M.D.   On: 10/21/2019 22:39    Pertinent labs & imaging results that were available during my care of the patient were reviewed by me and considered in my medical decision making (see chart for details).  Medications Ordered in ED Medications  sodium chloride flush (NS) 0.9 % injection 3 mL (has no administration in time range)  cephALEXin (KEFLEX) capsule 500 mg (has no administration in time range)  albuterol (VENTOLIN HFA) 108 (90 Base) MCG/ACT inhaler  8 puff (8 puffs Inhalation Given 10/22/19 0006)  ipratropium (ATROVENT HFA) inhaler 2 puff (2 puffs Inhalation Given 10/22/19 0007)  Procedures Procedures  (including critical care time)  Medical Decision Making / ED Course I have reviewed the nursing notes for this encounter and the patient's prior records (if available in EHR or on provided paperwork).   DESIDERIO MOSKWA was evaluated in Emergency Department on 10/22/2019 for the symptoms described in the history of present illness. He was evaluated in the context of the global COVID-19 pandemic, which necessitated consideration that the patient might be at risk for infection with the SARS-CoV-2 virus that causes COVID-19. Institutional protocols and algorithms that pertain to the evaluation of patients at risk for COVID-19 are in a state of rapid change based on information released by regulatory bodies including the CDC and federal and state organizations. These policies and algorithms were followed during the patient's care in the ED.  COPD exacerbation improved with albuterol nebs.  Improved air movement.  Patient is satting well on room air.  Chest x-ray without evidence of pneumonia.  Rest of the labs reassuring.  UA suspicious for urinary tract infection.  Will treat with Keflex.  Appropriate for outpatient management.  The patient appears reasonably screened and/or stabilized for discharge and I doubt any other medical condition or other [facility] requiring further screening, evaluation, or treatment in the ED at this time prior to discharge.  The patient is safe for discharge with strict return precautions.       Final Clinical Impression(s) / ED Diagnoses Final diagnoses:  COPD exacerbation ([place])  Lower urinary tract infection     The patient appears reasonably screened and/or stabilized for discharge  and I doubt any other medical condition or other [facility] requiring further screening, evaluation, or treatment in the ED at this time prior to discharge.  Disposition: Discharge  Condition: Good  I have discussed the results, Dx and Tx plan with the patient who expressed understanding and agree(s) with the plan. Discharge instructions discussed at great length. The patient was given strict return precautions who verbalized understanding of the instructions. No further questions at time of discharge.    ED Discharge Orders         Ordered    cephALEXin (KEFLEX) 500 MG capsule  3 times daily     10/22/19 0326    predniSONE (DELTASONE) 10 MG tablet  Daily     10/22/19 0326          Follow Up: [address] [place] [id number] [phone]  Schedule an appointment as soon as possible for a visit  in 5-7 days     This chart was dictated using voice recognition software.  Despite best efforts to proofread,  errors can occur which can change the documentation meaning.   Fatima Blank, MD 10/22/19 [phone]

## 2019-10-22 MED ORDER — CEPHALEXIN 250 MG PO CAPS
500.0000 mg | ORAL_CAPSULE | Freq: Once | ORAL | Status: AC
  Administered 2019-10-22: 04:00:00 500 mg via ORAL
  Filled 2019-10-22: qty 2

## 2019-10-22 MED ORDER — PREDNISONE 10 MG PO TABS: 40.0000 mg | ORAL_TABLET | Freq: Every day | ORAL | 0 refills | Status: AC

## 2019-10-22 MED ORDER — CEPHALEXIN 500 MG PO CAPS: 500.0000 mg | ORAL_CAPSULE | Freq: Three times a day (TID) | ORAL | 0 refills | Status: AC

## 2019-10-22 NOTE — ED Notes (Signed)
Medications given and charted per [facility]. Tolerated well. No distress noted. Chest rise and fall equally with no distress noted. Will continue to monitor. Pt denies cp, dizziness, n/v/d, and f/c. Bed in lowest position with call light within reach.

## 2019-10-22 NOTE — ED Notes (Signed)
Medications given and charted per MAR. Tolerated well. No distress noted. 

## 2019-10-22 NOTE — ED Notes (Signed)
Pt ambulated to and from bathroom with a smooth and steady gait. No distress noted. Pt denies new or worsening complaints. Will continue to monitor.

## 2019-10-24 ENCOUNTER — Other Ambulatory Visit: Payer: Self-pay | Admitting: Pharmacist

## 2019-10-24 DIAGNOSIS — J438 Other emphysema: Secondary | ICD-10-CM

## 2019-10-24 MED ORDER — TIOTROPIUM BROMIDE MONOHYDRATE 18 MCG IN CAPS: 18.0000 ug | ORAL_CAPSULE | Freq: Every day | RESPIRATORY_TRACT | 1 refills | Status: DC

## 2019-10-30 ENCOUNTER — Emergency Department (HOSPITAL_COMMUNITY)
Admission: EM | Admit: 2019-10-30 | Discharge: 2019-10-30 | Disposition: A | Discharge status: Home / Self Care | Payer: Medicare PPO | Attending: Emergency Medicine | Admitting: Emergency Medicine

## 2019-10-30 ENCOUNTER — Other Ambulatory Visit: Payer: Self-pay

## 2019-10-30 ENCOUNTER — Emergency Department (HOSPITAL_COMMUNITY): Payer: Medicare PPO

## 2019-10-30 DIAGNOSIS — I10 Essential (primary) hypertension: Secondary | ICD-10-CM | POA: Diagnosis not present

## 2019-10-30 DIAGNOSIS — R079 Chest pain, unspecified: Secondary | ICD-10-CM

## 2019-10-30 DIAGNOSIS — Z79899 Other long term (current) drug therapy: Secondary | ICD-10-CM | POA: Diagnosis not present

## 2019-10-30 DIAGNOSIS — Z86718 Personal history of other venous thrombosis and embolism: Secondary | ICD-10-CM | POA: Diagnosis not present

## 2019-10-30 DIAGNOSIS — R0789 Other chest pain: Secondary | ICD-10-CM | POA: Diagnosis present

## 2019-10-30 DIAGNOSIS — E039 Hypothyroidism, unspecified: Secondary | ICD-10-CM | POA: Diagnosis not present

## 2019-10-30 DIAGNOSIS — B20 Human immunodeficiency virus [HIV] disease: Secondary | ICD-10-CM | POA: Insufficient documentation

## 2019-10-30 DIAGNOSIS — F250 Schizoaffective disorder, bipolar type: Secondary | ICD-10-CM | POA: Diagnosis not present

## 2019-10-30 DIAGNOSIS — Z86711 Personal history of pulmonary embolism: Secondary | ICD-10-CM | POA: Diagnosis not present

## 2019-10-30 DIAGNOSIS — Z87891 Personal history of nicotine dependence: Secondary | ICD-10-CM | POA: Diagnosis not present

## 2019-10-30 LAB — CBC
HCT: 43.4 % (ref 39.0–52.0)
Hemoglobin: 14.5 g/dL (ref 13.0–17.0)
MCH: 34.6 pg — ABNORMAL HIGH (ref 26.0–34.0)
MCHC: 33.4 g/dL (ref 30.0–36.0)
MCV: 103.6 fL — ABNORMAL HIGH (ref 80.0–100.0)
Platelets: 143 10*3/uL — ABNORMAL LOW (ref 150–400)
RBC: 4.19 MIL/uL — ABNORMAL LOW (ref 4.22–5.81)
RDW: 15.6 % — ABNORMAL HIGH (ref 11.5–15.5)
WBC: 7.5 10*3/uL (ref 4.0–10.5)
nRBC: 0 % (ref 0.0–0.2)

## 2019-10-30 LAB — TROPONIN I (HIGH SENSITIVITY)
Troponin I (High Sensitivity): 3 ng/L (ref <18)
Troponin I (High Sensitivity): 4 ng/L (ref <18)

## 2019-10-30 LAB — BASIC METABOLIC PANEL
Anion gap: 9 (ref 5–15)
BUN: 15 mg/dL (ref 6–20)
CO2: 22 mmol/L (ref 22–32)
Calcium: 9.4 mg/dL (ref 8.9–10.3)
Chloride: 111 mmol/L (ref 98–111)
Creatinine, Ser: 1.01 mg/dL (ref 0.61–1.24)
GFR calc Af Amer: >60 mL/min (ref >60)
GFR calc non Af Amer: >60 mL/min (ref >60)
Glucose, Bld: 90 mg/dL (ref 70–99)
Potassium: 3.8 mmol/L (ref 3.5–5.1)
Sodium: 142 mmol/L (ref 135–145)

## 2019-10-30 NOTE — ED Triage Notes (Signed)
Pt here for chest tightness at 5 am this morning. Pt sts he took an energy pill this morning but per EMS it was a xantac.  Pt sts he is afraid to go to sleep bc he is afraid he won't wake up. Denies shob. Given ASA and nitro with no relief.

## 2019-10-30 NOTE — ED Provider Notes (Signed)
[facility] EMERGENCY DEPARTMENT Provider Note   CSN: [id number] Arrival date & time: 10/30/19  1118     History Chief Complaint  Patient presents with  . Chest Pain    [name] Herring is a 59 y.o. male.  59 year old male with past medical history of HIV, hypertension, PE, schizophrenia brought in by EMS for chest pain.  Patient states that he took an allergy pill today because he has COPD and needed energy, states the energy pills as a Zantac or [employer].  After taking the Zantac patient reports developing chest tightness with burning chest pain.  This discomfort is constant, nothing makes his symptoms worse, no relief with aspirin and nitro from EMS.  No history of similar pain previously, no cardiac history.  Also reports diffuse abdominal pain with nausea.  Patient reports schizophrenia with baseline hallucinations, does not feel threatened.  Denies vomiting, shortness of breath.  No other complaints or concerns. Last CD4 reviewed, April 2020, at 199.     HPI: A 59 year old patient with a history of hypertension presents for evaluation of chest pain. Initial onset of pain was approximately 3-6 hours ago. The patient's chest pain is well-localized, is described as heaviness/pressure/tightness and is not worse with exertion. The patient's chest pain is not middle- or left-sided, is not sharp and does not radiate to the arms/jaw/neck. The patient does not complain of nausea and denies diaphoresis. The patient has no history of stroke, has no history of peripheral artery disease, has not smoked in the past 90 days, denies any history of treated diabetes, has no relevant family history of coronary artery disease (first degree relative at less than age 56), has no history of hypercholesterolemia and does not have an elevated BMI (>=30).   Past Medical History:  Diagnosis Date  . Depression   . HIV (human immunodeficiency virus infection) ([place])   . Hypertension   . Immune  deficiency disorder ([place])   . PE (pulmonary thromboembolism) ([place]) 2018  . Personality disorder ([place])   . Schizophrenia [facility])     Patient Active Problem List   Diagnosis Date Noted  . Genital warts 10/16/2019  . GERD with esophagitis 01/22/2019  . Protein C deficiency ([place]) 07/05/2017  . Benign neoplasm of transverse colon   . Emphysema lung ([place]) 11/20/2016  . DVT of lower extremity, bilateral ([place]) 11/04/2016  . Tobacco abuse 11/02/2016  . PE (pulmonary thromboembolism) ([place]) 11/02/2016  . HTN (hypertension) 08/25/2015  . Alcohol use disorder, severe, in early remission ([place]) 12/27/2014  . Human immunodeficiency virus (HIV) disease ([place]) 11/09/2006  . CA IN SITU, RECTUM 11/09/2006  . Hypothyroidism 11/09/2006  . Paranoid schizophrenia ([place]) 11/09/2006  . DISORDER, BIPOLAR NOS 11/09/2006  . Depression 11/09/2006    Past Surgical History:  Procedure Laterality Date  . COLONOSCOPY WITH PROPOFOL N/A 06/15/2017   Procedure: COLONOSCOPY WITH PROPOFOL;  Surgeon: Irene Shipper, MD;  Location: [facility];  Service: Endoscopy;  Laterality: N/A;  . ESOPHAGOGASTRODUODENOSCOPY (EGD) WITH PROPOFOL N/A 06/15/2017   Procedure: ESOPHAGOGASTRODUODENOSCOPY (EGD) WITH PROPOFOL;  Surgeon: Irene Shipper, MD;  [facility];  Service: Endoscopy;  Laterality: N/A;  . IR GENERIC HISTORICAL  11/04/2016   IR IVC FILTER PLMT / S&I Burke Keels GUID/MOD SED 11/04/2016 Aletta Edouard, MD [facility]-INTERV RAD       Family History  Problem Relation Age of Onset  . CAD Mother   . Kidney disease Sister   . Lupus Brother   . Cancer Maternal Grandmother   .  Stroke Paternal Grandmother     Social History   Tobacco Use  . Smoking status: Former Smoker    Packs/day: 1.00    Years: 33.00    Pack years: 33.00    Types: Cigarettes    Quit date: 04/25/2019    Years since quitting: 0.5  . Smokeless tobacco: Never Used  Substance Use Topics  . Alcohol use: Not Currently    Comment: quit 2020  . Drug use:  Not Currently    Comment: quit using 2020    Home Medications Prior to Admission medications   Medication Sig Start Date End Date Taking? Authorizing Provider  acetaminophen (TYLENOL) 500 MG tablet Take 1 tablet (500 mg total) by mouth every 6 (six) hours as needed for moderate pain. Patient taking differently: Take 1,000 mg by mouth every 6 (six) hours as needed for moderate pain.  11/07/16   Eugenie Filler, MD  apixaban (ELIQUIS) 5 MG TABS tablet Take 1 tablet (5 mg total) by mouth 2 (two) times daily. 01/22/19   Charlott Rakes, MD  benztropine (COGENTIN) 1 MG tablet 1 tab in the morning 2 tabs at night Patient taking differently: Take 1-2 mg by mouth See admin instructions. Take 1 tablet in the morning and take 2 tablets at night 11/10/16   Freeman Caldron M, PA-C  bictegravir-emtricitabine-tenofovir AF (BIKTARVY) 50-200-25 MG TABS tablet TAKE ONE TABLET BY MOUTH DAILY. TRY TO TAKE AT THE SAME TIME EACH DAY WITH OR WITHOUT FOOD. Patient taking differently: Take 1 tablet by mouth daily.  06/10/19   Ubly Callas, NP  butalbital-acetaminophen-caffeine (ESGIC) [phone] MG tablet Take 1 tablet by mouth 2 (two) times daily as needed for headache. 02/26/18   Charlott Rakes, MD  cetirizine (ZYRTEC) 10 MG tablet Take 1 tablet (10 mg total) by mouth daily. Patient not taking: Reported on 10/22/2019 02/26/18   Charlott Rakes, MD  chlordiazePOXIDE (LIBRIUM) 25 MG capsule 50mg  PO TID x 1D, then 25-50mg  PO BID X 1D, then 25-50mg  PO QD X 1D Patient not taking: Reported on 10/22/2019 09/04/19   [place] Lanius A, PA-C  fluticasone (FLONASE) 50 MCG/ACT nasal spray Place 1 spray into both nostrils daily as needed for allergies.  10/03/19   [provider]  gabapentin (NEURONTIN) 800 MG tablet Take 800 mg by mouth at bedtime. 10/03/19   [provider]  haloperidol (HALDOL) 10 MG tablet Take 0.5 tablets (5 mg total) by mouth 2 (two) times daily. Patient not taking: Reported on 10/22/2019  06/18/17   Domenic Polite, MD  lisinopril (ZESTRIL) 40 MG tablet Take 40 mg by mouth daily. 07/30/19   [provider]  magnesium oxide (MAG-OX) 400 (241.3 Mg) MG tablet Take 1 tablet (400 mg total) by mouth 2 (two) times daily. 11/07/16   Eugenie Filler, MD  Melatonin 3-10 MG TABS Take 1 tablet by mouth at bedtime. 06/25/19   [provider]  metoprolol tartrate (LOPRESSOR) 25 MG tablet Take 1 tablet (25 mg total) by mouth 2 (two) times daily. 08/07/18   Charlott Rakes, MD  mirtazapine (REMERON) 30 MG tablet Take 1 tablet (30 mg total) by mouth at bedtime. 06/20/17   [place] Callas, NP  montelukast (SINGULAIR) 10 MG tablet Take 10 mg by mouth at bedtime. 10/02/19   [provider]  Multiple Vitamin (MULTIVITAMIN WITH MINERALS) TABS tablet Take 1 tablet by mouth daily. Patient not taking: Reported on 09/04/2019 11/08/16   Eugenie Filler, MD  naltrexone (DEPADE) 50 MG tablet Take 25  mg by mouth daily. 08/22/19   [provider]  pantoprazole (PROTONIX) 20 MG tablet Take 1 tablet (20 mg total) by mouth daily at 6 PM. Take 30 minutes before dinner on an empty stomach. Patient not taking: Reported on 10/22/2019 04/11/19   Campbell Riches, MD  pantoprazole (PROTONIX) 40 MG tablet Take 40 mg by mouth daily.  05/29/19   [provider]  polyethylene glycol (MIRALAX / GLYCOLAX) packet Take 17 g by mouth daily as needed for moderate constipation.     [provider]  QUEtiapine (SEROQUEL) 400 MG tablet Take 400 mg by mouth at bedtime.  03/27/19   [provider]  SYMBICORT 160-4.5 MCG/ACT inhaler Inhale 2 puffs into the lungs 2 (two) times daily. 10/03/19   [provider]  tamsulosin (FLOMAX) 0.4 MG CAPS capsule Take 1 capsule (0.4 mg total) by mouth daily after supper. 09/17/19   [place] Callas, NP  tiotropium (SPIRIVA) 18 MCG inhalation capsule Place 1 capsule (18 mcg total) into inhaler and inhale daily. 10/24/19   Charlott Rakes, MD  traZODone (DESYREL) 50 MG tablet Take 1 tablet (50 mg total) by mouth at bedtime. 08/07/18   Charlott Rakes, MD  Valbenazine Tosylate (INGREZZA) 80 MG CAPS Take 80 mg by mouth 2 (two) times daily.     [provider]    Allergies    Amoxicillin, Latex, and Abilify [aripiprazole]  Review of Systems   Review of Systems  Constitutional: Negative for fever.  Respiratory: Negative for shortness of breath.   Cardiovascular: Positive for chest pain. Negative for palpitations and leg swelling.  Gastrointestinal: Positive for abdominal pain and nausea. Negative for constipation, diarrhea and vomiting.  Genitourinary: Negative for difficulty urinating and dysuria.  Musculoskeletal: Negative for arthralgias, back pain and myalgias.  Skin: Negative for rash and wound.  Allergic/Immunologic: Positive for immunocompromised state.  Neurological: Negative for weakness.  Psychiatric/Behavioral: Positive for hallucinations. Negative for self-injury.  All other systems reviewed and are negative.   Physical Exam Updated Vital Signs BP (!) 144/105   Pulse 76   Temp 98.4 F (36.9 C) (Oral)   Resp (!) 24   Ht 5\' 9"  (1.753 m)   Wt 60.8 kg   SpO2 97%   BMI 19.79 kg/m   Physical Exam Vitals and nursing note reviewed.  Constitutional:      Appearance: Normal appearance.  HENT:     Head: Normocephalic and atraumatic.  Cardiovascular:     Rate and Rhythm: Normal rate and regular rhythm.     Pulses: Normal pulses.     Heart sounds: Normal heart sounds.  Pulmonary:     Effort: Pulmonary effort is normal.     Breath sounds: Normal breath sounds.  Chest:     Chest wall: No tenderness.  Abdominal:     Palpations: Abdomen is soft.     Tenderness: There is no abdominal tenderness.  Musculoskeletal:     Cervical back: Neck supple.     Right lower leg: No edema.     Left lower leg: No edema.  Skin:    General: Skin is warm and dry.     Findings: No erythema or rash.    Neurological:     Mental Status: He is alert.  Psychiatric:        Behavior: Behavior is agitated.        Thought Content: Thought content does not include homicidal or suicidal ideation.     ED Results / Procedures / Treatments  Labs (all labs ordered are listed, but only abnormal results are displayed) Labs Reviewed  CBC - Abnormal; Notable for the following components:      Result Value   RBC 4.19 (*)    MCV 103.6 (*)    MCH 34.6 (*)    RDW 15.6 (*)    Platelets 143 (*)    All other components within normal limits  BASIC METABOLIC PANEL  TROPONIN I (HIGH SENSITIVITY)  TROPONIN I (HIGH SENSITIVITY)    EKG EKG Interpretation  Date/Time:  Thursday October 30 2019 11:35:09 EST Ventricular Rate:  83 PR Interval:    QRS Duration: 92 QT Interval:  382 QTC Calculation: 449 R Axis:   -36 Text Interpretation: Sinus rhythm Probable left ventricular hypertrophy no significant change since Oct 21 2019 Confirmed by Sherwood Gambler [id number]) on 10/30/2019 12:05:55 PM   Radiology DG Chest Port 1 View  Result Date: 10/30/2019 CLINICAL DATA:  Chest pain for 2 weeks, pain radiates to bilateral chest and neck, tobacco abuse EXAM: PORTABLE CHEST 1 VIEW COMPARISON:  10/21/2019 FINDINGS: Single frontal view of the chest demonstrates an unremarkable cardiac silhouette. No airspace disease, effusion, or pneumothorax. Chronic interstitial prominence consistent with tobacco abuse. IMPRESSION: 1. No acute intrathoracic process. Electronically Signed   By: Randa Ngo M.D.   On: 10/30/2019 12:46    Procedures Procedures (including critical care time)  Medications Ordered in ED Medications - No data to display  ED Course  I have reviewed the triage vital signs and the nursing notes.  Pertinent labs & imaging results that were available during my care of the patient were reviewed by me and considered in my medical decision making (see chart for details).  Clinical Course as of Oct 29 1433  Thu Feb 04, 178  [id number] 59 year old male brought in by EMS with complaint of chest pain after taking a Zantac today.  On arrival, patient appeared to be very uncomfortable/agitated, abdomen is soft and nontender, no chest wall tenderness.  Patient is already given aspirin and nitro by EMS.  EKG without acute ischemic changes, CBC and BMP without significant findings or changes from baseline.  Initial troponin negative. On recheck, patient is resting comfortably in the room, sleeping, is easily aroused to verbal stimuli, states that he is feeling better at this time.  HEART score 3, patient advised to follow up with PCP, return to ER for new or worsening symptoms.    [LM]    Clinical Course User Index [LM] Roque Lias   MDM Rules/Calculators/A&P HEAR Score: 3                    Final Clinical Impression(s) / ED Diagnoses Final diagnoses:  Nonspecific chest pain    Rx / DC Orders ED Discharge Orders    None       Tacy Learn, PA-C 10/30/19 [place], MD 11/01/19 1201

## 2019-11-02 ENCOUNTER — Emergency Department (HOSPITAL_COMMUNITY)
Admission: EM | Admit: 2019-11-02 | Discharge: 2019-11-02 | Disposition: A | Discharge status: Home / Self Care | Payer: Medicare PPO | Attending: Emergency Medicine | Admitting: Emergency Medicine

## 2019-11-02 ENCOUNTER — Other Ambulatory Visit: Payer: Self-pay

## 2019-11-02 ENCOUNTER — Encounter (HOSPITAL_COMMUNITY): Payer: Self-pay

## 2019-11-02 ENCOUNTER — Emergency Department (HOSPITAL_COMMUNITY): Payer: Medicare PPO

## 2019-11-02 DIAGNOSIS — R0602 Shortness of breath: Secondary | ICD-10-CM | POA: Diagnosis present

## 2019-11-02 DIAGNOSIS — R0600 Dyspnea, unspecified: Secondary | ICD-10-CM | POA: Insufficient documentation

## 2019-11-02 DIAGNOSIS — Z79899 Other long term (current) drug therapy: Secondary | ICD-10-CM | POA: Diagnosis not present

## 2019-11-02 DIAGNOSIS — Z87891 Personal history of nicotine dependence: Secondary | ICD-10-CM | POA: Diagnosis not present

## 2019-11-02 DIAGNOSIS — B20 Human immunodeficiency virus [HIV] disease: Secondary | ICD-10-CM | POA: Diagnosis not present

## 2019-11-02 DIAGNOSIS — Z7901 Long term (current) use of anticoagulants: Secondary | ICD-10-CM | POA: Diagnosis not present

## 2019-11-02 DIAGNOSIS — Z91040 Latex allergy status: Secondary | ICD-10-CM | POA: Insufficient documentation

## 2019-11-02 DIAGNOSIS — Z20822 Contact with and (suspected) exposure to covid-19: Secondary | ICD-10-CM | POA: Insufficient documentation

## 2019-11-02 DIAGNOSIS — I10 Essential (primary) hypertension: Secondary | ICD-10-CM | POA: Diagnosis not present

## 2019-11-02 LAB — BASIC METABOLIC PANEL
Anion gap: 12 (ref 5–15)
BUN: 19 mg/dL (ref 6–20)
CO2: 23 mmol/L (ref 22–32)
Calcium: 8.9 mg/dL (ref 8.9–10.3)
Chloride: 106 mmol/L (ref 98–111)
Creatinine, Ser: 0.92 mg/dL (ref 0.61–1.24)
GFR calc Af Amer: >60 mL/min (ref >60)
GFR calc non Af Amer: >60 mL/min (ref >60)
Glucose, Bld: 102 mg/dL — ABNORMAL HIGH (ref 70–99)
Potassium: 3.2 mmol/L — ABNORMAL LOW (ref 3.5–5.1)
Sodium: 141 mmol/L (ref 135–145)

## 2019-11-02 LAB — CBC WITH DIFFERENTIAL/PLATELET
Abs Immature Granulocytes: 0.02 10*3/uL (ref 0.00–0.07)
Basophils Absolute: 0 10*3/uL (ref 0.0–0.1)
Basophils Relative: 0 %
Eosinophils Absolute: 0 10*3/uL (ref 0.0–0.5)
Eosinophils Relative: 0 %
HCT: 41 % (ref 39.0–52.0)
Hemoglobin: 13.8 g/dL (ref 13.0–17.0)
Immature Granulocytes: 0 %
Lymphocytes Relative: 15 %
Lymphs Abs: 1.1 10*3/uL (ref 0.7–4.0)
MCH: 34.9 pg — ABNORMAL HIGH (ref 26.0–34.0)
MCHC: 33.7 g/dL (ref 30.0–36.0)
MCV: 103.8 fL — ABNORMAL HIGH (ref 80.0–100.0)
Monocytes Absolute: 0.6 10*3/uL (ref 0.1–1.0)
Monocytes Relative: 8 %
Neutro Abs: 5.6 10*3/uL (ref 1.7–7.7)
Neutrophils Relative %: 77 %
Platelets: 147 10*3/uL — ABNORMAL LOW (ref 150–400)
RBC: 3.95 MIL/uL — ABNORMAL LOW (ref 4.22–5.81)
RDW: 15.5 % (ref 11.5–15.5)
WBC: 7.4 10*3/uL (ref 4.0–10.5)
nRBC: 0 % (ref 0.0–0.2)

## 2019-11-02 LAB — RESPIRATORY PANEL BY RT PCR (FLU A&B, COVID)
Influenza A by PCR: NEGATIVE
Influenza B by PCR: NEGATIVE
SARS Coronavirus 2 by RT PCR: NEGATIVE

## 2019-11-02 LAB — BRAIN NATRIURETIC PEPTIDE: B Natriuretic Peptide: 44.5 pg/mL (ref 0.0–100.0)

## 2019-11-02 MED ORDER — POTASSIUM CHLORIDE CRYS ER 20 MEQ PO TBCR
40.0000 meq | EXTENDED_RELEASE_TABLET | Freq: Once | ORAL | Status: AC
  Administered 2019-11-02: 40 meq via ORAL
  Filled 2019-11-02: qty 2

## 2019-11-02 MED ORDER — ALBUTEROL SULFATE HFA 108 (90 BASE) MCG/ACT IN AERS
2.0000 | INHALATION_SPRAY | Freq: Once | RESPIRATORY_TRACT | Status: AC
  Administered 2019-11-02: 2 via RESPIRATORY_TRACT
  Filled 2019-11-02: qty 6.7

## 2019-11-02 MED ORDER — LORAZEPAM 2 MG/ML IJ SOLN
1.0000 mg | Freq: Once | INTRAMUSCULAR | Status: AC
  Administered 2019-11-02: 1 mg via INTRAVENOUS
  Filled 2019-11-02: qty 1

## 2019-11-02 NOTE — ED Provider Notes (Signed)
[facility] DEPT Provider Note   CSN: [id number] Arrival date & time: 11/02/19  1857     History Chief Complaint  Patient presents with  . Shortness of Breath    [name] Herring is a 59 y.o. male.  HPI    59 year old male with dyspnea.  Onset yesterday.  Persistent since then.  Associated with cough.  Nonproductive.  No fevers or chills.  No unusual leg pain or swelling.  He states that he is out of his inhaler currently.  He does have a past history of pulmonary embolism.  He reports compliance with all his medications including Eliquis. Past Medical History:  Diagnosis Date  . Depression   . HIV (human immunodeficiency virus infection) ([place])   . Hypertension   . Immune deficiency disorder ([place])   . PE (pulmonary thromboembolism) ([place]) 2018  . Personality disorder ([place])   . Schizophrenia [facility])     Patient Active Problem List   Diagnosis Date Noted  . Genital warts 10/16/2019  . GERD with esophagitis 01/22/2019  . Protein C deficiency ([place]) 07/05/2017  . Benign neoplasm of transverse colon   . Emphysema lung ([place]) 11/20/2016  . DVT of lower extremity, bilateral ([place]) 11/04/2016  . Tobacco abuse 11/02/2016  . PE (pulmonary thromboembolism) ([place]) 11/02/2016  . HTN (hypertension) 08/25/2015  . Alcohol use disorder, severe, in early remission ([place]) 12/27/2014  . Human immunodeficiency virus (HIV) disease ([place]) 11/09/2006  . CA IN SITU, RECTUM 11/09/2006  . Hypothyroidism 11/09/2006  . Paranoid schizophrenia ([place]) 11/09/2006  . DISORDER, BIPOLAR NOS 11/09/2006  . Depression 11/09/2006    Past Surgical History:  Procedure Laterality Date  . COLONOSCOPY WITH PROPOFOL N/A 06/15/2017   Procedure: COLONOSCOPY WITH PROPOFOL;  Surgeon: Irene Shipper, MD;  [facility];  Service: Endoscopy;  Laterality: N/A;  . ESOPHAGOGASTRODUODENOSCOPY (EGD) WITH PROPOFOL N/A 06/15/2017   Procedure: ESOPHAGOGASTRODUODENOSCOPY (EGD) WITH PROPOFOL;   Surgeon: Irene Shipper, MD;  [facility];  Service: Endoscopy;  Laterality: N/A;  . IR GENERIC HISTORICAL  11/04/2016   IR IVC FILTER PLMT / S&I Burke Keels GUID/MOD SED 11/04/2016 Aletta Edouard, MD [facility]-INTERV RAD     Family History  Problem Relation Age of Onset  . CAD Mother   . Kidney disease Sister   . Lupus Brother   . Cancer Maternal Grandmother   . Stroke Paternal Grandmother     Social History   Tobacco Use  . Smoking status: Former Smoker    Packs/day: 1.00    Years: 33.00    Pack years: 33.00    Types: Cigarettes    Quit date: 04/25/2019    Years since quitting: 0.5  . Smokeless tobacco: Never Used  Substance Use Topics  . Alcohol use: Not Currently    Comment: quit 2020  . Drug use: Not Currently    Comment: quit using 2020    Home Medications Prior to Admission medications   Medication Sig Start Date End Date Taking? Authorizing Provider  acetaminophen (TYLENOL) 500 MG tablet Take 1 tablet (500 mg total) by mouth every 6 (six) hours as needed for moderate pain. Patient taking differently: Take 1,000 mg by mouth every 6 (six) hours as needed for moderate pain.  11/07/16   Eugenie Filler, MD  apixaban (ELIQUIS) 5 MG TABS tablet Take 1 tablet (5 mg total) by mouth 2 (two) times daily. 01/22/19   Charlott Rakes, MD  benztropine (COGENTIN) 1 MG tablet 1 tab in the morning 2 tabs  at night Patient taking differently: Take 1-2 mg by mouth See admin instructions. Take 1 tablet in the morning and take 2 tablets at night 11/10/16   Freeman Caldron M, PA-C  bictegravir-emtricitabine-tenofovir AF (BIKTARVY) 50-200-25 MG TABS tablet TAKE ONE TABLET BY MOUTH DAILY. TRY TO TAKE AT THE SAME TIME EACH DAY WITH OR WITHOUT FOOD. Patient taking differently: Take 1 tablet by mouth daily.  06/10/19   [place] Callas, NP  butalbital-acetaminophen-caffeine (ESGIC) [phone] MG tablet Take 1 tablet by mouth 2 (two) times daily as needed for headache. 02/26/18   Charlott Rakes, MD   cetirizine (ZYRTEC) 10 MG tablet Take 1 tablet (10 mg total) by mouth daily. Patient not taking: Reported on 10/22/2019 02/26/18   Charlott Rakes, MD  chlordiazePOXIDE (LIBRIUM) 25 MG capsule 50mg  PO TID x 1D, then 25-50mg  PO BID X 1D, then 25-50mg  PO QD X 1D Patient not taking: Reported on 10/22/2019 09/04/19   [place] Lanius A, PA-C  fluticasone (FLONASE) 50 MCG/ACT nasal spray Place 1 spray into both nostrils daily as needed for allergies.  10/03/19   [provider]  gabapentin (NEURONTIN) 800 MG tablet Take 800 mg by mouth at bedtime. 10/03/19   [provider]  haloperidol (HALDOL) 10 MG tablet Take 0.5 tablets (5 mg total) by mouth 2 (two) times daily. Patient not taking: Reported on 10/22/2019 06/18/17   Domenic Polite, MD  lisinopril (ZESTRIL) 40 MG tablet Take 40 mg by mouth daily. 07/30/19   [provider]  magnesium oxide (MAG-OX) 400 (241.3 Mg) MG tablet Take 1 tablet (400 mg total) by mouth 2 (two) times daily. 11/07/16   Eugenie Filler, MD  Melatonin 3-10 MG TABS Take 1 tablet by mouth at bedtime. 06/25/19   [provider]  metoprolol tartrate (LOPRESSOR) 25 MG tablet Take 1 tablet (25 mg total) by mouth 2 (two) times daily. 08/07/18   Charlott Rakes, MD  mirtazapine (REMERON) 30 MG tablet Take 1 tablet (30 mg total) by mouth at bedtime. 06/20/17   [place] Callas, NP  montelukast (SINGULAIR) 10 MG tablet Take 10 mg by mouth at bedtime. 10/02/19   [provider]  Multiple Vitamin (MULTIVITAMIN WITH MINERALS) TABS tablet Take 1 tablet by mouth daily. Patient not taking: Reported on 09/04/2019 11/08/16   Eugenie Filler, MD  naltrexone (DEPADE) 50 MG tablet Take 25 mg by mouth daily. 08/22/19   [provider]  pantoprazole (PROTONIX) 20 MG tablet Take 1 tablet (20 mg total) by mouth daily at 6 PM. Take 30 minutes before dinner on an empty stomach. Patient not taking: Reported on 10/22/2019 04/11/19   Campbell Riches, MD   pantoprazole (PROTONIX) 40 MG tablet Take 40 mg by mouth daily.  05/29/19   [provider]  polyethylene glycol (MIRALAX / GLYCOLAX) packet Take 17 g by mouth daily as needed for moderate constipation.     [provider]  QUEtiapine (SEROQUEL) 400 MG tablet Take 400 mg by mouth at bedtime.  03/27/19   [provider]  SYMBICORT 160-4.5 MCG/ACT inhaler Inhale 2 puffs into the lungs 2 (two) times daily. 10/03/19   [provider]  tamsulosin (FLOMAX) 0.4 MG CAPS capsule Take 1 capsule (0.4 mg total) by mouth daily after supper. 09/17/19   [place] Callas, NP  tiotropium (SPIRIVA) 18 MCG inhalation capsule Place 1 capsule (18 mcg total) into inhaler and inhale daily. 10/24/19   Charlott Rakes, MD  traZODone (DESYREL) 50 MG tablet Take 1 tablet (50  mg total) by mouth at bedtime. 08/07/18   Charlott Rakes, MD  Valbenazine Tosylate (INGREZZA) 80 MG CAPS Take 80 mg by mouth 2 (two) times daily.     [provider]    Allergies    Amoxicillin, Latex, and Abilify [aripiprazole]  Review of Systems   Review of Systems All systems reviewed and negative, other than as noted in HPI.  Physical Exam Updated Vital Signs There were no vitals taken for this visit.  Physical Exam Vitals and nursing note reviewed.  Constitutional:      General: He is not in acute distress.    Appearance: He is well-developed.  HENT:     Head: Normocephalic and atraumatic.  Eyes:     General:        Right eye: No discharge.        Left eye: No discharge.     Conjunctiva/sclera: Conjunctivae normal.  Cardiovascular:     Rate and Rhythm: Normal rate and regular rhythm.     Heart sounds: Normal heart sounds. No murmur. No friction rub. No gallop.   Pulmonary:     Effort: Pulmonary effort is normal. No respiratory distress.     Breath sounds: Normal breath sounds.  Abdominal:     General: There is no distension.     Palpations: Abdomen is soft.     Tenderness: There is no  abdominal tenderness.  Musculoskeletal:        General: No tenderness.     Cervical back: Neck supple.  Skin:    General: Skin is warm and dry.  Neurological:     Mental Status: He is alert.  Psychiatric:        Behavior: Behavior normal.        Thought Content: Thought content normal.     ED Results / Procedures / Treatments   Labs (all labs ordered are listed, but only abnormal results are displayed) Labs Reviewed  CBC WITH DIFFERENTIAL/PLATELET - Abnormal; Notable for the following components:      Result Value   RBC 3.95 (*)    MCV 103.8 (*)    MCH 34.9 (*)    Platelets 147 (*)    All other components within normal limits  BASIC METABOLIC PANEL - Abnormal; Notable for the following components:   Potassium 3.2 (*)    Glucose, Bld 102 (*)    All other components within normal limits  RESPIRATORY PANEL BY RT PCR (FLU A&B, COVID)  BRAIN NATRIURETIC PEPTIDE    EKG EKG Interpretation  Date/Time:  Sunday November 02 2019 19:37:45 EST Ventricular Rate:  83 PR Interval:    QRS Duration: 98 QT Interval:  397 QTC Calculation: 467 R Axis:   -30 Text Interpretation: Sinus rhythm Left ventricular hypertrophy Baseline wander in lead(s) III aVL Confirmed by Virgel Manifold [phone]) on 11/02/2019 8:06:39 PM Also confirmed by Virgel Manifold [phone]), editor Hattie Perch [phone])  on 11/03/2019 7:46:56 AM   Radiology DG Chest Portable 1 View  Result Date: 11/02/2019 CLINICAL DATA:  Dyspnea EXAM: PORTABLE CHEST 1 VIEW COMPARISON:  October 30, 2019 FINDINGS: The heart size and mediastinal contours are within normal limits. Both lungs are clear. The visualized skeletal structures are unremarkable. IMPRESSION: No active disease. Electronically Signed   By: Prudencio Pair M.D.   On: 11/02/2019 20:23    Procedures Procedures (including critical care time)  Medications Ordered in ED Medications - No data to display  ED Course  I have reviewed the triage vital signs and  the nursing  notes.  Pertinent labs & imaging results that were available during my care of the patient were reviewed by me and considered in my medical decision making (see chart for details).    MDM Rules/Calculators/A&P                      59 year old male with dyspnea.  Odd affect.  May be his personality disorder versus some anxiety.  Regardless, I doubt emergent process.  He is afebrile.  No increased work of breathing.  O2 sats are normal on room air.  Chest x-ray is without acute abnormality.  No signs/symptoms of DVT.  Is a past history of pulmonary embolism but he reports compliance with his Eliquis.   Final Clinical Impression(s) / ED Diagnoses Final diagnoses:  Dyspnea, unspecified type    Rx / DC Orders ED Discharge Orders    None       Virgel Manifold, MD 11/04/19 [phone]

## 2019-11-02 NOTE — ED Triage Notes (Signed)
He phoned EMS d/t c/o shortness of breath. Saw pcp this week and has had a negative COVID test yesterday. He is in no distress.

## 2019-11-02 NOTE — ED Notes (Signed)
Patient ambulatory at discharge

## 2019-11-02 NOTE — ED Notes (Signed)
Lt green, Lav Green and Blue sent to lab if needed

## 2019-11-04 ENCOUNTER — Other Ambulatory Visit: Payer: Self-pay | Admitting: Family Medicine

## 2019-11-04 DIAGNOSIS — D6859 Other primary thrombophilia: Secondary | ICD-10-CM

## 2019-11-04 DIAGNOSIS — Z86718 Personal history of other venous thrombosis and embolism: Secondary | ICD-10-CM

## 2019-11-04 NOTE — Telephone Encounter (Signed)
Please fill if appropriate to continue usage

## 2019-11-11 ENCOUNTER — Other Ambulatory Visit: Payer: Self-pay

## 2019-11-11 ENCOUNTER — Emergency Department (HOSPITAL_COMMUNITY): Payer: Medicare PPO

## 2019-11-11 ENCOUNTER — Encounter (HOSPITAL_COMMUNITY): Payer: Self-pay | Admitting: Emergency Medicine

## 2019-11-11 ENCOUNTER — Emergency Department (HOSPITAL_COMMUNITY)
Admission: EM | Admit: 2019-11-11 | Discharge: 2019-11-11 | Disposition: A | Discharge status: Home / Self Care | Payer: Medicare PPO | Attending: Emergency Medicine | Admitting: Emergency Medicine

## 2019-11-11 DIAGNOSIS — Z7901 Long term (current) use of anticoagulants: Secondary | ICD-10-CM | POA: Insufficient documentation

## 2019-11-11 DIAGNOSIS — F17210 Nicotine dependence, cigarettes, uncomplicated: Secondary | ICD-10-CM | POA: Insufficient documentation

## 2019-11-11 DIAGNOSIS — N179 Acute kidney failure, unspecified: Secondary | ICD-10-CM | POA: Diagnosis not present

## 2019-11-11 DIAGNOSIS — Z21 Asymptomatic human immunodeficiency virus [HIV] infection status: Secondary | ICD-10-CM | POA: Insufficient documentation

## 2019-11-11 DIAGNOSIS — I10 Essential (primary) hypertension: Secondary | ICD-10-CM | POA: Diagnosis not present

## 2019-11-11 DIAGNOSIS — R0602 Shortness of breath: Secondary | ICD-10-CM

## 2019-11-11 DIAGNOSIS — Z91040 Latex allergy status: Secondary | ICD-10-CM | POA: Diagnosis not present

## 2019-11-11 DIAGNOSIS — K219 Gastro-esophageal reflux disease without esophagitis: Secondary | ICD-10-CM | POA: Diagnosis not present

## 2019-11-11 DIAGNOSIS — R079 Chest pain, unspecified: Secondary | ICD-10-CM | POA: Diagnosis present

## 2019-11-11 DIAGNOSIS — Z79899 Other long term (current) drug therapy: Secondary | ICD-10-CM | POA: Insufficient documentation

## 2019-11-11 DIAGNOSIS — E039 Hypothyroidism, unspecified: Secondary | ICD-10-CM | POA: Diagnosis not present

## 2019-11-11 LAB — CBC WITH DIFFERENTIAL/PLATELET
Abs Immature Granulocytes: 0.02 10*3/uL (ref 0.00–0.07)
Basophils Absolute: 0 10*3/uL (ref 0.0–0.1)
Basophils Relative: 0 %
Eosinophils Absolute: 0 10*3/uL (ref 0.0–0.5)
Eosinophils Relative: 0 %
HCT: 50.9 % (ref 39.0–52.0)
Hemoglobin: 16.8 g/dL (ref 13.0–17.0)
Immature Granulocytes: 0 %
Lymphocytes Relative: 12 %
Lymphs Abs: 0.9 10*3/uL (ref 0.7–4.0)
MCH: 35.2 pg — ABNORMAL HIGH (ref 26.0–34.0)
MCHC: 33 g/dL (ref 30.0–36.0)
MCV: 106.7 fL — ABNORMAL HIGH (ref 80.0–100.0)
Monocytes Absolute: 0.6 10*3/uL (ref 0.1–1.0)
Monocytes Relative: 9 %
Neutro Abs: 5.8 10*3/uL (ref 1.7–7.7)
Neutrophils Relative %: 79 %
Platelets: 125 10*3/uL — ABNORMAL LOW (ref 150–400)
RBC: 4.77 MIL/uL (ref 4.22–5.81)
RDW: 15.4 % (ref 11.5–15.5)
WBC: 7.4 10*3/uL (ref 4.0–10.5)
nRBC: 0 % (ref 0.0–0.2)

## 2019-11-11 LAB — BASIC METABOLIC PANEL
Anion gap: 11 (ref 5–15)
BUN: 24 mg/dL — ABNORMAL HIGH (ref 6–20)
CO2: 27 mmol/L (ref 22–32)
Calcium: 10.3 mg/dL (ref 8.9–10.3)
Chloride: 109 mmol/L (ref 98–111)
Creatinine, Ser: 1.73 mg/dL — ABNORMAL HIGH (ref 0.61–1.24)
GFR calc Af Amer: 49 mL/min — ABNORMAL LOW (ref >60)
GFR calc non Af Amer: 43 mL/min — ABNORMAL LOW (ref >60)
Glucose, Bld: 114 mg/dL — ABNORMAL HIGH (ref 70–99)
Potassium: 4.2 mmol/L (ref 3.5–5.1)
Sodium: 147 mmol/L — ABNORMAL HIGH (ref 135–145)

## 2019-11-11 LAB — BRAIN NATRIURETIC PEPTIDE: B Natriuretic Peptide: 43.8 pg/mL (ref 0.0–100.0)

## 2019-11-11 LAB — TROPONIN I (HIGH SENSITIVITY)
Troponin I (High Sensitivity): 6 ng/L (ref <18)
Troponin I (High Sensitivity): 7 ng/L (ref <18)

## 2019-11-11 MED ORDER — ALUM & MAG HYDROXIDE-SIMETH 200-200-20 MG/5ML PO SUSP
30.0000 mL | Freq: Once | ORAL | Status: AC
  Administered 2019-11-11: 03:00:00 30 mL via ORAL
  Filled 2019-11-11: qty 30

## 2019-11-11 MED ORDER — LIDOCAINE VISCOUS HCL 2 % MT SOLN
15.0000 mL | Freq: Once | OROMUCOSAL | Status: AC
  Administered 2019-11-11: 15 mL via ORAL
  Filled 2019-11-11: qty 15

## 2019-11-11 MED ORDER — SODIUM CHLORIDE 0.9 % IV BOLUS
1000.0000 mL | Freq: Once | INTRAVENOUS | Status: AC
  Administered 2019-11-11: 1000 mL via INTRAVENOUS

## 2019-11-11 NOTE — ED Notes (Signed)
Pt verbalized understanding of d/c instructions and follow up care. Pt had no additional questions at this time.

## 2019-11-11 NOTE — ED Triage Notes (Signed)
Pt arrived [facility] from home c/oSohb, dizziness,  Nausea and chest wall pain r/t a cough. Pt received 4mg  zofran enroute. VSS, alert and oriented x4

## 2019-11-11 NOTE — ED Provider Notes (Signed)
[facility] EMERGENCY DEPARTMENT Provider Note   CSN: [id number] Arrival date & time: 11/11/19  0031     History Chief Complaint  Patient presents with  . Shortness of Breath    [name] Herring is a 59 y.o. male.  HPI     This is a 59 year old male with a history of HIV, hypertension, PE, schizophrenia who presents with chest pain and shortness of breath.  Patient reports ongoing for the last week.  He describes nausea and burning in his chest.  It is worse with eating.  He describes ongoing shortness of breath which he associates with his COPD.  Denies any fever.  Does endorse cough.  No known Covid symptoms.  Patient continues to take his "ulcer medication" but that does not seem to be helping.  He denies any abdominal pain, vomiting, diarrhea.  Past Medical History:  Diagnosis Date  . Depression   . HIV (human immunodeficiency virus infection) ([place])   . Hypertension   . Immune deficiency disorder ([place])   . PE (pulmonary thromboembolism) ([place]) 2018  . Personality disorder ([place])   . Schizophrenia [facility])     Patient Active Problem List   Diagnosis Date Noted  . Genital warts 10/16/2019  . GERD with esophagitis 01/22/2019  . Protein C deficiency ([place]) 07/05/2017  . Benign neoplasm of transverse colon   . Emphysema lung ([place]) 11/20/2016  . DVT of lower extremity, bilateral ([place]) 11/04/2016  . Tobacco abuse 11/02/2016  . PE (pulmonary thromboembolism) ([place]) 11/02/2016  . HTN (hypertension) 08/25/2015  . Alcohol use disorder, severe, in early remission ([place]) 12/27/2014  . Human immunodeficiency virus (HIV) disease ([place]) 11/09/2006  . CA IN SITU, RECTUM 11/09/2006  . Hypothyroidism 11/09/2006  . Paranoid schizophrenia ([place]) 11/09/2006  . DISORDER, BIPOLAR NOS 11/09/2006  . Depression 11/09/2006    Past Surgical History:  Procedure Laterality Date  . COLONOSCOPY WITH PROPOFOL N/A 06/15/2017   Procedure: COLONOSCOPY WITH PROPOFOL;  Surgeon: Irene Shipper, MD;  Location: [facility] ENDOSCOPY;  Service: Endoscopy;  Laterality: N/A;  . ESOPHAGOGASTRODUODENOSCOPY (EGD) WITH PROPOFOL N/A 06/15/2017   Procedure: ESOPHAGOGASTRODUODENOSCOPY (EGD) WITH PROPOFOL;  Surgeon: Irene Shipper, MD;  Location: [facility] ENDOSCOPY;  Service: Endoscopy;  Laterality: N/A;  . IR GENERIC HISTORICAL  11/04/2016   IR IVC FILTER PLMT / S&I Burke Keels GUID/MOD SED 11/04/2016 Aletta Edouard, MD [facility]-INTERV RAD       Family History  Problem Relation Age of Onset  . CAD Mother   . Kidney disease Sister   . Lupus Brother   . Cancer Maternal Grandmother   . Stroke Paternal Grandmother     Social History   Tobacco Use  . Smoking status: Current Every Day Smoker    Packs/day: 0.50    Years: 33.00    Pack years: 16.50    Types: Cigarettes    Last attempt to quit: 04/25/2019    Years since quitting: 0.5  . Smokeless tobacco: Never Used  Substance Use Topics  . Alcohol use: Not Currently    Comment: quit 2020  . Drug use: Not Currently    Comment: quit using 2020    Home Medications Prior to Admission medications   Medication Sig Start Date End Date Taking? Authorizing Provider  acetaminophen (TYLENOL) 500 MG tablet Take 1 tablet (500 mg total) by mouth every 6 (six) hours as needed for moderate pain. Patient taking differently: Take 1,000 mg by mouth every 6 (six) hours as needed for moderate pain.  11/07/16  Eugenie Filler, MD  benztropine (COGENTIN) 1 MG tablet 1 tab in the morning 2 tabs at night Patient taking differently: Take 1-2 mg by mouth See admin instructions. Take 1 tablet in the morning and take 2 tablets at night 11/10/16   Freeman Caldron M, PA-C  bictegravir-emtricitabine-tenofovir AF (BIKTARVY) 50-200-25 MG TABS tablet TAKE ONE TABLET BY MOUTH DAILY. TRY TO TAKE AT THE SAME TIME EACH DAY WITH OR WITHOUT FOOD. Patient taking differently: Take 1 tablet by mouth daily.  06/10/19   [place] Callas, NP  butalbital-acetaminophen-caffeine (ESGIC) [phone] MG tablet  Take 1 tablet by mouth 2 (two) times daily as needed for headache. 02/26/18   Charlott Rakes, MD  cetirizine (ZYRTEC) 10 MG tablet Take 1 tablet (10 mg total) by mouth daily. Patient not taking: Reported on 10/22/2019 02/26/18   Charlott Rakes, MD  chlordiazePOXIDE (LIBRIUM) 25 MG capsule 50mg  PO TID x 1D, then 25-50mg  PO BID X 1D, then 25-50mg  PO QD X 1D Patient not taking: Reported on 10/22/2019 09/04/19   [place] Lanius A, PA-C  ELIQUIS 5 MG TABS tablet TAKE ONE TABLET BY MOUTH TWICE DAILY 11/04/19   Charlott Rakes, MD  fluticasone (FLONASE) 50 MCG/ACT nasal spray Place 1 spray into both nostrils daily as needed for allergies.  10/03/19   [provider]  gabapentin (NEURONTIN) 800 MG tablet Take 800 mg by mouth at bedtime. 10/03/19   [provider]  haloperidol (HALDOL) 10 MG tablet Take 0.5 tablets (5 mg total) by mouth 2 (two) times daily. Patient not taking: Reported on 10/22/2019 06/18/17   Domenic Polite, MD  lisinopril (ZESTRIL) 40 MG tablet Take 40 mg by mouth daily. 07/30/19   [provider]  magnesium oxide (MAG-OX) 400 (241.3 Mg) MG tablet Take 1 tablet (400 mg total) by mouth 2 (two) times daily. 11/07/16   Eugenie Filler, MD  Melatonin 3-10 MG TABS Take 1 tablet by mouth at bedtime. 06/25/19   [provider]  metoprolol tartrate (LOPRESSOR) 25 MG tablet Take 1 tablet (25 mg total) by mouth 2 (two) times daily. 08/07/18   Charlott Rakes, MD  mirtazapine (REMERON) 30 MG tablet Take 1 tablet (30 mg total) by mouth at bedtime. 06/20/17   Navarino Callas, NP  montelukast (SINGULAIR) 10 MG tablet Take 10 mg by mouth at bedtime. 10/02/19   [provider]  Multiple Vitamin (MULTIVITAMIN WITH MINERALS) TABS tablet Take 1 tablet by mouth daily. Patient not taking: Reported on 09/04/2019 11/08/16   Eugenie Filler, MD  naltrexone (DEPADE) 50 MG tablet Take 25 mg by mouth daily. 08/22/19   [provider]  pantoprazole (PROTONIX) 20 MG  tablet Take 1 tablet (20 mg total) by mouth daily at 6 PM. Take 30 minutes before dinner on an empty stomach. Patient not taking: Reported on 10/22/2019 04/11/19   Campbell Riches, MD  pantoprazole (PROTONIX) 40 MG tablet Take 40 mg by mouth daily.  05/29/19   [provider]  polyethylene glycol (MIRALAX / GLYCOLAX) packet Take 17 g by mouth daily as needed for moderate constipation.     [provider]  QUEtiapine (SEROQUEL) 400 MG tablet Take 400 mg by mouth at bedtime.  03/27/19   [provider]  SYMBICORT 160-4.5 MCG/ACT inhaler Inhale 2 puffs into the lungs 2 (two) times daily. 10/03/19   [provider]  tamsulosin (FLOMAX) 0.4 MG CAPS capsule Take 1 capsule (0.4 mg total) by mouth daily after supper. 09/17/19   Janene Madeira  N, NP  tiotropium (SPIRIVA) 18 MCG inhalation capsule Place 1 capsule (18 mcg total) into inhaler and inhale daily. 10/24/19   Charlott Rakes, MD  traZODone (DESYREL) 50 MG tablet Take 1 tablet (50 mg total) by mouth at bedtime. 08/07/18   Charlott Rakes, MD  Valbenazine Tosylate (INGREZZA) 80 MG CAPS Take 80 mg by mouth 2 (two) times daily.     [provider]    Allergies    Amoxicillin, Latex, and Abilify [aripiprazole]  Review of Systems   Review of Systems  Constitutional: Negative for fever.  Respiratory: Positive for cough and shortness of breath.   Cardiovascular: Positive for chest pain.  Gastrointestinal: Positive for nausea. Negative for abdominal pain, constipation, diarrhea and vomiting.  Genitourinary: Negative for dysuria.  All other systems reviewed and are negative.   Physical Exam Updated Vital Signs BP 104/77   Pulse 92   Temp 97.9 F (36.6 C) (Oral)   Resp (!) 30   Ht 1.753 m (5\' 9" )   Wt 60.8 kg   SpO2 96%   BMI 19.79 kg/m   Physical Exam Vitals and nursing note reviewed.  Constitutional:      Comments: Thin, chronically ill-appearing  HENT:     Head: Normocephalic and atraumatic.   Eyes:     Pupils: Pupils are equal, round, and reactive to light.  Cardiovascular:     Rate and Rhythm: Normal rate and regular rhythm.     Heart sounds: Normal heart sounds. No murmur.  Pulmonary:     Effort: Pulmonary effort is normal. No respiratory distress.     Breath sounds: Wheezing present.     Comments: Occasional expiratory wheeze Abdominal:     General: Bowel sounds are normal.     Palpations: Abdomen is soft.     Tenderness: There is no abdominal tenderness. There is no rebound.  Musculoskeletal:     Cervical back: Neck supple.     Right lower leg: No tenderness.     Left lower leg: No tenderness.  Skin:    General: Skin is warm and dry.  Neurological:     Mental Status: He is alert and oriented to person, place, and time.  Psychiatric:        Mood and Affect: Mood normal.     ED Results / Procedures / Treatments   Labs (all labs ordered are listed, but only abnormal results are displayed) Labs Reviewed  CBC WITH DIFFERENTIAL/PLATELET - Abnormal; Notable for the following components:      Result Value   MCV 106.7 (*)    MCH 35.2 (*)    Platelets 125 (*)    All other components within normal limits  BASIC METABOLIC PANEL - Abnormal; Notable for the following components:   Sodium 147 (*)    Glucose, Bld 114 (*)    BUN 24 (*)    Creatinine, Ser 1.73 (*)    GFR calc non Af Amer 43 (*)    GFR calc Af Amer 49 (*)    All other components within normal limits  BRAIN NATRIURETIC PEPTIDE  TROPONIN I (HIGH SENSITIVITY)  TROPONIN I (HIGH SENSITIVITY)    EKG EKG Interpretation  Date/Time:  Tuesday November 11 2019 00:43:44 EST Ventricular Rate:  90 PR Interval:    QRS Duration: 90 QT Interval:  376 QTC Calculation: 461 R Axis:   60 Text Interpretation: Sinus rhythm Probable left atrial enlargement Probable left ventricular hypertrophy No significant change since last tracing Confirmed by Thayer Jew [phone])  on 11/11/2019 12:55:25 AM   Radiology DG  Chest Portable 1 View  Result Date: 11/11/2019 CLINICAL DATA:  Shortness of breath. Dizziness. Nausea. Chest pain. Cough. EXAM: PORTABLE CHEST 1 VIEW COMPARISON:  Multiple prior exams most recent radiograph 11/02/2019 FINDINGS: The cardiomediastinal contours are normal. Emphysema with chronic basilar crowding of bronchovascular structures. Pulmonary vasculature is normal. No consolidation, pleural effusion, or pneumothorax. No acute osseous abnormalities are seen. IVC filter in the upper abdomen is partially included. IMPRESSION: Emphysema without acute abnormality. Electronically Signed   By: Keith Rake M.D.   On: 11/11/2019 01:31    Procedures Procedures (including critical care time)  Medications Ordered in ED Medications  alum & mag hydroxide-simeth (MAALOX/MYLANTA) 200-200-20 MG/5ML suspension 30 mL (30 mLs Oral Given 11/11/19 0243)    And  lidocaine (XYLOCAINE) 2 % viscous mouth solution 15 mL (15 mLs Oral Given 11/11/19 0243)  sodium chloride 0.9 % bolus 1,000 mL (1,000 mLs Intravenous New Bag/Given 11/11/19 0408)    ED Course  I have reviewed the triage vital signs and the nursing notes.  Pertinent labs & imaging results that were available during my care of the patient were reviewed by me and considered in my medical decision making (see chart for details).    MDM Rules/Calculators/A&P                       Patient presents with several complaints including atypical burning chest pain, shortness of breath.  He is overall nontoxic-appearing.  Description of chest pain is ongoing and related with food.  Suspect reflux or his known peptic ulcer.  He had relief with GI cocktail.  EKG shows no evidence of acute ischemia or arrhythmia.  Troponin is negative.  Doubt ACS.  Given ongoing nature of his complaints, do not feel he needs a repeat troponin.  Chest x-ray shows COPD findings but no acute pneumothorax or pneumonia.  His breath sounds are clear and without wheezing.  Doubt COPD  exacerbation.  He does have evidence of AKI with a creatinine of 1.7 and sodium of 147 which is likely related to dehydration.  Patient was given a liter of fluids.  On recheck he is tolerating fluids without difficulty.  Recommend close follow-up with his primary physician for recheck of his labs.    After history, exam, and medical workup I feel the patient has been appropriately medically screened and is safe for discharge home. Pertinent diagnoses were discussed with the patient. Patient was given return precautions.  Final Clinical Impression(s) / ED Diagnoses Final diagnoses:  AKI (acute kidney injury) ([place])  Gastroesophageal reflux disease, unspecified whether esophagitis present  Shortness of breath    Rx / DC Orders ED Discharge Orders    None       Merryl Hacker, MD 11/11/19 [phone]

## 2019-11-11 NOTE — Discharge Instructions (Addendum)
You were seen today for multiple complaints.  Regarding your chest discomfort, this is suspicious for your reflux or peptic ulcer disease.  Follow-up with your primary physician.  Continue your medications.  You were noted to be dehydrated with evidence of acute kidney injury.  Make sure that you are drinking plenty of fluids at home.  Follow-up with your primary physician for recheck of a metabolic panel.

## 2019-11-13 ENCOUNTER — Telehealth (INDEPENDENT_AMBULATORY_CARE_PROVIDER_SITE_OTHER): Payer: Medicare PPO | Admitting: Infectious Diseases

## 2019-11-13 ENCOUNTER — Encounter: Payer: Self-pay | Admitting: Infectious Diseases

## 2019-11-13 ENCOUNTER — Other Ambulatory Visit: Payer: Self-pay | Admitting: Infectious Diseases

## 2019-11-13 DIAGNOSIS — Z21 Asymptomatic human immunodeficiency virus [HIV] infection status: Secondary | ICD-10-CM

## 2019-11-13 DIAGNOSIS — Z125 Encounter for screening for malignant neoplasm of prostate: Secondary | ICD-10-CM

## 2019-11-13 DIAGNOSIS — F1021 Alcohol dependence, in remission: Secondary | ICD-10-CM

## 2019-11-13 DIAGNOSIS — N179 Acute kidney failure, unspecified: Secondary | ICD-10-CM

## 2019-11-13 DIAGNOSIS — A630 Anogenital (venereal) warts: Secondary | ICD-10-CM

## 2019-11-13 NOTE — Assessment & Plan Note (Signed)
With his concern of the lesion growing larger I have made him an appointment with Dr. Johnnye Sima Monday for in-person evaluation.   He has "rectal carcinoma in situ" in chart records but no further details documented anywhere. Given the growth over the last 4 weeks despite Condylyx use presume he will need referral to surgeon team with anoscopy. He appreciated seeing Dr. Johnnye Sima again and was thankful for the in person follow up.

## 2019-11-13 NOTE — Assessment & Plan Note (Signed)
Presuming he is on Naltrexone - hopeful this will continue to work for him. Counseled that EtOH can contribute to gastritis.

## 2019-11-13 NOTE — Progress Notes (Signed)
Patient Name: [name] Herring  Date of Birth: 1961-08-18  MRN: [id number]  PCP: Sonia Side., FNP    Virtual Visit via Telephone Note  I connected with [name] Herring on 11/13/19 at  1:45 PM EST by telephone and verified that I am speaking with the correct person using two identifiers.   I discussed the limitations, risks, security and privacy concerns of performing an evaluation and management service by telephone and the availability of in person appointments. I also discussed with the patient that there may be a patient responsible charge related to this service. The patient expressed understanding and agreed to proceed.  Patient Location: home, [place] Provider Location: home, [place]   Patient Active Problem List   Diagnosis Date Noted  . Genital warts 10/16/2019  . GERD with esophagitis 01/22/2019  . Protein C deficiency ([place]) 07/05/2017  . Benign neoplasm of transverse colon   . Emphysema lung ([place]) 11/20/2016  . DVT of lower extremity, bilateral ([place]) 11/04/2016  . Tobacco abuse 11/02/2016  . PE (pulmonary thromboembolism) ([place]) 11/02/2016  . HTN (hypertension) 08/25/2015  . Alcohol use disorder, severe, in early remission ([place]) 12/27/2014  . Human immunodeficiency virus (HIV) disease ([place]) 11/09/2006  . CA IN SITU, RECTUM 11/09/2006  . Hypothyroidism 11/09/2006  . Paranoid schizophrenia ([place]) 11/09/2006  . DISORDER, BIPOLAR NOS 11/09/2006  . Depression 11/09/2006    SUBJECTIVE:  Brief Narrative:  [name] Herring is a 59 y.o. AA male with a history of HIV disease Dx 08/18/1998. Previously followed at [facility].  HIV Risk: MSM.  History of OIs: uncertain. History of ETOH/cocaine abuse, previously homeless.   Previous Regimens:   Kaletra + Combivir   Genvoya > stopped d/t DDI with [facility]  Biktarvy 2019 >> suppressed    CC: FU on genital warts 4 ER visits since visit 10m ago.     HPI: Since our last telephone visit a month ago, [name] Herring has been in the ER 4  times for various concerns.   1/26 - COPD exacerbation and dysuria with fevers --> cephalexin TID, nebs + prednisone; no culture was done.   2/04 - chest pain; improved after zantac, nitro and ASA. Troponins negative.   2/07 - SOB; CXR normal, labs reassuring, non-hypoxic. Continues to take Eliquis  2/16 - SOB, atypical burning CP; CXR stable with chronic COPD, -wheezing on exam. AKI with Cr 1.7 Na 147 --> 1L fluids    He is on a new medication for acid reflux which has been better now and no further chest pain but commends that when this happens it "feels like he is having a heart attack." He has also started looking at diet to ensure he is not taking things that would trigger. He has improved with alcohol use and is on a new pill to "decrease the cravings" with good effect.   He has been doing well with his Biktarvy and does not miss any doses.  He has continued to work with [employer], [employer] operational [employer] and enjoys her coming to see him.   Still using Flomax with good results.   His genital wart is still present despite the cream his PCP gave him and actually feels as if it is larger. We were supposed to evaluate this in person today.     Review of Systems  Constitutional: Negative for chills, fever, malaise/fatigue and weight loss.  HENT: Negative for sore throat.        No dental problems  Respiratory: Positive for cough  and shortness of breath. Negative for sputum production.   Cardiovascular: Positive for chest pain. Negative for leg swelling.  Gastrointestinal: Negative for abdominal pain, diarrhea and vomiting.  Genitourinary: Negative for dysuria and flank pain.  Musculoskeletal: Negative for joint pain, myalgias and neck pain.  Skin: Negative for rash.  Neurological: Negative for dizziness, tingling and headaches.  Psychiatric/Behavioral: Negative for depression and substance abuse. The patient is not nervous/anxious and does not have insomnia.      Past Medical History:    Diagnosis Date  . Depression   . HIV (human immunodeficiency virus infection) ([place])   . Hypertension   . Immune deficiency disorder ([place])   . PE (pulmonary thromboembolism) ([place]) 2018  . Personality disorder ([place])   . Schizophrenia [facility])    Outpatient Medications Prior to Visit  Medication Sig Dispense Refill  . acetaminophen (TYLENOL) 500 MG tablet Take 1 tablet (500 mg total) by mouth every 6 (six) hours as needed for moderate pain. (Patient taking differently: Take 1,000 mg by mouth every 6 (six) hours as needed for moderate pain. ) 30 tablet 0  . benztropine (COGENTIN) 1 MG tablet 1 tab in the morning 2 tabs at night (Patient taking differently: Take 1-2 mg by mouth See admin instructions. Take 1 tablet in the morning and take 2 tablets at night) 14 tablet 0  . bictegravir-emtricitabine-tenofovir AF (BIKTARVY) 50-200-25 MG TABS tablet TAKE ONE TABLET BY MOUTH DAILY. TRY TO TAKE AT THE SAME TIME EACH DAY WITH OR WITHOUT FOOD. (Patient taking differently: Take 1 tablet by mouth daily. ) 90 tablet 3  . butalbital-acetaminophen-caffeine (ESGIC) 50-325-40 MG tablet Take 1 tablet by mouth 2 (two) times daily as needed for headache. 30 tablet 0  . cetirizine (ZYRTEC) 10 MG tablet Take 1 tablet (10 mg total) by mouth daily. (Patient not taking: Reported on 10/22/2019) 30 tablet 1  . chlordiazePOXIDE (LIBRIUM) 25 MG capsule 50mg  PO TID x 1D, then 25-50mg  PO BID X 1D, then 25-50mg  PO QD X 1D (Patient not taking: Reported on 10/22/2019) 10 capsule 0  . ELIQUIS 5 MG TABS tablet TAKE ONE TABLET BY MOUTH TWICE DAILY 180 tablet 1  . fluticasone (FLONASE) 50 MCG/ACT nasal spray Place 1 spray into both nostrils daily as needed for allergies.     [place] Kitchen gabapentin (NEURONTIN) 800 MG tablet Take 800 mg by mouth at bedtime.    . haloperidol (HALDOL) 10 MG tablet Take 0.5 tablets (5 mg total) by mouth 2 (two) times daily. (Patient not taking: Reported on 10/22/2019)    . lisinopril (ZESTRIL) 40 MG tablet Take 40 mg by  mouth daily.    . magnesium oxide (MAG-OX) 400 (241.3 Mg) MG tablet Take 1 tablet (400 mg total) by mouth 2 (two) times daily. 60 tablet 0  . Melatonin 3-10 MG TABS Take 1 tablet by mouth at bedtime.    . metoprolol tartrate (LOPRESSOR) 25 MG tablet Take 1 tablet (25 mg total) by mouth 2 (two) times daily. 180 tablet 1  . mirtazapine (REMERON) 30 MG tablet Take 1 tablet (30 mg total) by mouth at bedtime. 30 tablet 3  . montelukast (SINGULAIR) 10 MG tablet Take 10 mg by mouth at bedtime.    . Multiple Vitamin (MULTIVITAMIN WITH MINERALS) TABS tablet Take 1 tablet by mouth daily. (Patient not taking: Reported on 09/04/2019)    . naltrexone (DEPADE) 50 MG tablet Take 25 mg by mouth daily.    . pantoprazole (PROTONIX) 20 MG tablet Take 1 tablet (20 mg  total) by mouth daily at 6 PM. Take 30 minutes before dinner on an empty stomach. (Patient not taking: Reported on 10/22/2019) 90 tablet 1  . pantoprazole (PROTONIX) 40 MG tablet Take 40 mg by mouth daily.     . polyethylene glycol (MIRALAX / GLYCOLAX) packet Take 17 g by mouth daily as needed for moderate constipation.     . QUEtiapine (SEROQUEL) 400 MG tablet Take 400 mg by mouth at bedtime.     . SYMBICORT 160-4.5 MCG/ACT inhaler Inhale 2 puffs into the lungs 2 (two) times daily.    . tamsulosin (FLOMAX) 0.4 MG CAPS capsule Take 1 capsule (0.4 mg total) by mouth daily after supper. 30 capsule 5  . tiotropium (SPIRIVA) 18 MCG inhalation capsule Place 1 capsule (18 mcg total) into inhaler and inhale daily. 90 capsule 1  . traZODone (DESYREL) 50 MG tablet Take 1 tablet (50 mg total) by mouth at bedtime. 30 tablet 3  . Valbenazine Tosylate (INGREZZA) 80 MG CAPS Take 80 mg by mouth 2 (two) times daily.      No facility-administered medications prior to visit.   Allergies  Allergen Reactions  . Amoxicillin Shortness Of Breath and Rash    Has patient had a PCN reaction causing immediate rash, facial/tongue/throat swelling, SOB or lightheadedness with  hypotension: yes Has patient had a PCN reaction causing severe rash involving mucus membranes or skin necrosis: no Has patient had a PCN reaction that required hospitalization: yes drs office visit Has patient had a PCN reaction occurring within the last 10 years: no If all of the above answers are "NO", then may proceed with Cephalosporin use.   . Latex Shortness Of Breath  . Abilify [Aripiprazole] Other (See Comments)    Double vision   Social History   Tobacco Use  . Smoking status: Current Every Day Smoker    Packs/day: 0.50    Years: 33.00    Pack years: 16.50    Types: Cigarettes    Last attempt to quit: 04/25/2019    Years since quitting: 0.5  . Smokeless tobacco: Never Used  Substance Use Topics  . Alcohol use: Not Currently    Comment: quit 2020  . Drug use: Not Currently    Comment: quit using 2020   Objective:  There were no vitals filed for this visit. There is no height or weight on file to calculate BMI.  Physical Exam Pulmonary:     Effort: Pulmonary effort is normal.     Comments: No shortness of breath detected in conversation.  Neurological:     Mental Status: He is oriented to person, place, and time.  Psychiatric:        Mood and Affect: Mood normal.        Behavior: Behavior normal.        Thought Content: Thought content normal.        Judgment: Judgment normal.      Lab Results Lab Results  Component Value Date   WBC 7.4 11/11/2019   HGB 16.8 11/11/2019   HCT 50.9 11/11/2019   MCV 106.7 (H) 11/11/2019   PLT 125 (L) 11/11/2019    Lab Results  Component Value Date   CREATININE 1.73 (H) 11/11/2019   BUN 24 (H) 11/11/2019   NA 147 (H) 11/11/2019   K 4.2 11/11/2019   CL 109 11/11/2019   CO2 27 11/11/2019    Lab Results  Component Value Date   ALT 14 10/21/2019   AST 21 10/21/2019  ALKPHOS 51 10/21/2019   BILITOT 0.7 10/21/2019    Lab Results  Component Value Date   CHOL 113 01/22/2019   HDL 56 01/22/2019   LDLCALC 40  01/22/2019   TRIG 86 01/22/2019   CHOLHDL 2.0 01/22/2019   HIV 1 RNA Quant (copies/mL)  Date Value  06/10/2019 <20 DETECTED (A)  01/22/2019 <20 DETECTED (A)  06/17/2018 <20 DETECTED (A)   CD4 T Cell Abs (/uL)  Date Value  01/22/2019 199 (L)  06/17/2018 250 (L)  10/24/2017 290 (L)   Lab Results  Component Value Date   HAV REACTIVE (A) 06/17/2018   Lab Results  Component Value Date   HEPBSAG NON-REACTIVE 06/17/2018   HEPBSAB BORDERLINE (A) 06/17/2018    ASSESSMENT & PLAN:   Problem List Items Addressed This Visit      Unprioritized   Genital warts    With his concern of the lesion growing larger I have made him an appointment with Dr. Johnnye Sima Monday for in-person evaluation.   He has "rectal carcinoma in situ" in chart records but no further details documented anywhere. Given the growth over the last 4 weeks despite Condylyx use presume he will need referral to surgeon team with anoscopy. He appreciated seeing Dr. Johnnye Sima again and was thankful for the in person follow up.       Alcohol use disorder, severe, in early remission [facility])    Presuming he is on Naltrexone - hopeful this will continue to work for him. Counseled that EtOH can contribute to gastritis.         Follow Up Instructions: RTC on Monday with Dr. Johnnye Sima for in-person assessment of suspected genital wart.    I discussed the assessment and treatment plan with the patient. The patient was provided an opportunity to ask questions and all were answered. The patient agreed with the plan and demonstrated an understanding of the instructions.   The patient was advised to call back or seek an in-person evaluation if the symptoms worsen or if the condition fails to improve as anticipated.  I provided 8 minutes of non-face-to-face time during this encounter.   Janene Madeira, MSN, NP-C [facility] [email or web address] Pager:  [phone] Office: [place]: [phone]    11/13/19

## 2019-11-14 ENCOUNTER — Emergency Department (HOSPITAL_COMMUNITY)
Admission: EM | Admit: 2019-11-14 | Discharge: 2019-11-14 | Disposition: A | Discharge status: Home / Self Care | Payer: Medicare PPO | Attending: Emergency Medicine | Admitting: Emergency Medicine

## 2019-11-14 ENCOUNTER — Other Ambulatory Visit: Payer: Self-pay

## 2019-11-14 ENCOUNTER — Encounter (HOSPITAL_COMMUNITY): Payer: Self-pay

## 2019-11-14 ENCOUNTER — Emergency Department (HOSPITAL_COMMUNITY)
Admission: EM | Admit: 2019-11-14 | Discharge: 2019-11-17 | Disposition: A | Discharge status: Other Acute Inpatient Hospital | Payer: Medicare PPO | Attending: Emergency Medicine | Admitting: Emergency Medicine

## 2019-11-14 DIAGNOSIS — Z21 Asymptomatic human immunodeficiency virus [HIV] infection status: Secondary | ICD-10-CM | POA: Diagnosis not present

## 2019-11-14 DIAGNOSIS — F419 Anxiety disorder, unspecified: Secondary | ICD-10-CM | POA: Diagnosis not present

## 2019-11-14 DIAGNOSIS — Z91040 Latex allergy status: Secondary | ICD-10-CM | POA: Insufficient documentation

## 2019-11-14 DIAGNOSIS — F17210 Nicotine dependence, cigarettes, uncomplicated: Secondary | ICD-10-CM | POA: Insufficient documentation

## 2019-11-14 DIAGNOSIS — T50901A Poisoning by unspecified drugs, medicaments and biological substances, accidental (unintentional), initial encounter: Secondary | ICD-10-CM | POA: Diagnosis not present

## 2019-11-14 DIAGNOSIS — G4700 Insomnia, unspecified: Secondary | ICD-10-CM | POA: Diagnosis not present

## 2019-11-14 DIAGNOSIS — Z79899 Other long term (current) drug therapy: Secondary | ICD-10-CM | POA: Diagnosis not present

## 2019-11-14 DIAGNOSIS — Z5321 Procedure and treatment not carried out due to patient leaving prior to being seen by health care provider: Secondary | ICD-10-CM | POA: Diagnosis not present

## 2019-11-14 DIAGNOSIS — F319 Bipolar disorder, unspecified: Secondary | ICD-10-CM | POA: Diagnosis not present

## 2019-11-14 DIAGNOSIS — Z72820 Sleep deprivation: Secondary | ICD-10-CM | POA: Insufficient documentation

## 2019-11-14 DIAGNOSIS — Z20822 Contact with and (suspected) exposure to covid-19: Secondary | ICD-10-CM | POA: Diagnosis not present

## 2019-11-14 DIAGNOSIS — F209 Schizophrenia, unspecified: Secondary | ICD-10-CM | POA: Diagnosis not present

## 2019-11-14 DIAGNOSIS — I10 Essential (primary) hypertension: Secondary | ICD-10-CM | POA: Diagnosis not present

## 2019-11-14 DIAGNOSIS — Z7901 Long term (current) use of anticoagulants: Secondary | ICD-10-CM | POA: Diagnosis not present

## 2019-11-14 LAB — CBG MONITORING, ED: Glucose-Capillary: 105 mg/dL — ABNORMAL HIGH (ref 70–99)

## 2019-11-14 LAB — COMPREHENSIVE METABOLIC PANEL
ALT: 24 U/L (ref 0–44)
AST: 25 U/L (ref 15–41)
Albumin: 3.8 g/dL (ref 3.5–5.0)
Alkaline Phosphatase: 55 U/L (ref 38–126)
Anion gap: 10 (ref 5–15)
BUN: 26 mg/dL — ABNORMAL HIGH (ref 6–20)
CO2: 20 mmol/L — ABNORMAL LOW (ref 22–32)
Calcium: 9.4 mg/dL (ref 8.9–10.3)
Chloride: 108 mmol/L (ref 98–111)
Creatinine, Ser: 1.53 mg/dL — ABNORMAL HIGH (ref 0.61–1.24)
GFR calc Af Amer: 57 mL/min — ABNORMAL LOW (ref >60)
GFR calc non Af Amer: 49 mL/min — ABNORMAL LOW (ref >60)
Glucose, Bld: 109 mg/dL — ABNORMAL HIGH (ref 70–99)
Potassium: 4.5 mmol/L (ref 3.5–5.1)
Sodium: 138 mmol/L (ref 135–145)
Total Bilirubin: 0.8 mg/dL (ref 0.3–1.2)
Total Protein: 6.8 g/dL (ref 6.5–8.1)

## 2019-11-14 LAB — ETHANOL: Alcohol, Ethyl (B): <10 mg/dL (ref <10)

## 2019-11-14 LAB — CBC
HCT: 49.7 % (ref 39.0–52.0)
Hemoglobin: 16.8 g/dL (ref 13.0–17.0)
MCH: 35.7 pg — ABNORMAL HIGH (ref 26.0–34.0)
MCHC: 33.8 g/dL (ref 30.0–36.0)
MCV: 105.5 fL — ABNORMAL HIGH (ref 80.0–100.0)
Platelets: UNDETERMINED 10*3/uL (ref 150–400)
RBC: 4.71 MIL/uL (ref 4.22–5.81)
RDW: 14.3 % (ref 11.5–15.5)
WBC: 6.5 10*3/uL (ref 4.0–10.5)
nRBC: 0 % (ref 0.0–0.2)

## 2019-11-14 LAB — ACETAMINOPHEN LEVEL: Acetaminophen (Tylenol), Serum: 20 ug/mL (ref 10–30)

## 2019-11-14 LAB — SALICYLATE LEVEL: Salicylate Lvl: <7 mg/dL — ABNORMAL LOW (ref 7.0–30.0)

## 2019-11-14 NOTE — ED Triage Notes (Signed)
Pt comes via [facility] for a drug overdose that he reports that he took at  midnight last night, reports that he took 12 "sleeping pills" that were blue that his neighbor provided for him but he does not know what they are and he has still not been able to go to sleep ,pt denies SI/HI/AVH, seen at [facility] today for the same but left due to wait times. Pt also states that he is having dizziness and SOB that started this morning.

## 2019-11-14 NOTE — ED Triage Notes (Signed)
Pt BIB EMS from home. Pt reports severe anxiety for a few months. Pt states he has been unable to sleep due to anxiety. Pt reports taking "5 sleeping pills" which did not work and then took 7 more and fell asleep. Pt reports waking up and feeling more anxious.

## 2019-11-15 ENCOUNTER — Encounter (HOSPITAL_COMMUNITY): Payer: Self-pay | Admitting: Behavioral Health

## 2019-11-15 LAB — RESPIRATORY PANEL BY RT PCR (FLU A&B, COVID)
Influenza A by PCR: NEGATIVE
Influenza B by PCR: NEGATIVE
SARS Coronavirus 2 by RT PCR: NEGATIVE

## 2019-11-15 LAB — RAPID URINE DRUG SCREEN, HOSP PERFORMED
Amphetamines: NOT DETECTED
Barbiturates: NOT DETECTED
Benzodiazepines: NOT DETECTED
Cocaine: NOT DETECTED
Opiates: NOT DETECTED
Tetrahydrocannabinol: NOT DETECTED

## 2019-11-15 LAB — TSH: TSH: 0.431 u[IU]/mL (ref 0.350–4.500)

## 2019-11-15 MED ORDER — BENZTROPINE MESYLATE 1 MG PO TABS: 1.0000 mg | ORAL_TABLET | ORAL | Status: DC

## 2019-11-15 MED ORDER — PANTOPRAZOLE SODIUM 40 MG PO TBEC
40.0000 mg | DELAYED_RELEASE_TABLET | Freq: Every day | ORAL | Status: DC
  Administered 2019-11-15 – 2019-11-17 (×3): 40 mg via ORAL
  Filled 2019-11-15 (×3): qty 1

## 2019-11-15 MED ORDER — VALBENAZINE TOSYLATE 80 MG PO CAPS: 80.0000 mg | ORAL_CAPSULE | Freq: Two times a day (BID) | ORAL | Status: DC

## 2019-11-15 MED ORDER — BENZTROPINE MESYLATE 1 MG PO TABS
2.0000 mg | ORAL_TABLET | Freq: Every day | ORAL | Status: DC
  Administered 2019-11-15 – 2019-11-16 (×2): 2 mg via ORAL
  Filled 2019-11-15 (×2): qty 2

## 2019-11-15 MED ORDER — NALTREXONE HCL 50 MG PO TABS
25.0000 mg | ORAL_TABLET | Freq: Every day | ORAL | Status: DC
  Administered 2019-11-15 – 2019-11-17 (×3): 25 mg via ORAL
  Filled 2019-11-15 (×3): qty 1

## 2019-11-15 MED ORDER — QUETIAPINE FUMARATE 200 MG PO TABS
400.0000 mg | ORAL_TABLET | Freq: Every day | ORAL | Status: DC
  Administered 2019-11-15 – 2019-11-16 (×2): 400 mg via ORAL
  Filled 2019-11-15 (×2): qty 2

## 2019-11-15 MED ORDER — MAGNESIUM OXIDE 400 (241.3 MG) MG PO TABS
400.0000 mg | ORAL_TABLET | Freq: Two times a day (BID) | ORAL | Status: DC
  Administered 2019-11-15 – 2019-11-17 (×5): 400 mg via ORAL
  Filled 2019-11-15 (×5): qty 1

## 2019-11-15 MED ORDER — MIRTAZAPINE 15 MG PO TABS
30.0000 mg | ORAL_TABLET | Freq: Every day | ORAL | Status: DC
  Administered 2019-11-15 – 2019-11-16 (×2): 30 mg via ORAL
  Filled 2019-11-15 (×2): qty 2

## 2019-11-15 MED ORDER — UMECLIDINIUM BROMIDE 62.5 MCG/INH IN AEPB
1.0000 | INHALATION_SPRAY | Freq: Every day | RESPIRATORY_TRACT | Status: DC
  Administered 2019-11-15 – 2019-11-17 (×3): 1 via RESPIRATORY_TRACT
  Filled 2019-11-15: qty 7

## 2019-11-15 MED ORDER — TRAZODONE HCL 50 MG PO TABS
50.0000 mg | ORAL_TABLET | Freq: Every day | ORAL | Status: DC
  Administered 2019-11-15 – 2019-11-16 (×2): 50 mg via ORAL
  Filled 2019-11-15 (×2): qty 1

## 2019-11-15 MED ORDER — MELATONIN 3 MG PO TABS
ORAL_TABLET | Freq: Every day | ORAL | Status: DC
  Administered 2019-11-16: 3 mg via ORAL
  Filled 2019-11-15 (×4): qty 1

## 2019-11-15 MED ORDER — SODIUM CHLORIDE 0.9 % IV BOLUS
1000.0000 mL | Freq: Once | INTRAVENOUS | Status: AC
  Administered 2019-11-15: 1000 mL via INTRAVENOUS

## 2019-11-15 MED ORDER — BENZTROPINE MESYLATE 1 MG PO TABS
1.0000 mg | ORAL_TABLET | Freq: Every morning | ORAL | Status: DC
  Administered 2019-11-15 – 2019-11-17 (×3): 1 mg via ORAL
  Filled 2019-11-15 (×3): qty 1

## 2019-11-15 MED ORDER — TAMSULOSIN HCL 0.4 MG PO CAPS
0.4000 mg | ORAL_CAPSULE | Freq: Every day | ORAL | Status: DC
  Administered 2019-11-15 – 2019-11-16 (×2): 0.4 mg via ORAL
  Filled 2019-11-15 (×2): qty 1

## 2019-11-15 MED ORDER — TIOTROPIUM BROMIDE MONOHYDRATE 18 MCG IN CAPS: 18.0000 ug | ORAL_CAPSULE | Freq: Every day | RESPIRATORY_TRACT | Status: DC

## 2019-11-15 MED ORDER — MOMETASONE FURO-FORMOTEROL FUM 200-5 MCG/ACT IN AERO
2.0000 | INHALATION_SPRAY | Freq: Two times a day (BID) | RESPIRATORY_TRACT | Status: DC
  Administered 2019-11-15 – 2019-11-17 (×5): 2 via RESPIRATORY_TRACT
  Filled 2019-11-15: qty 8.8

## 2019-11-15 MED ORDER — MONTELUKAST SODIUM 10 MG PO TABS
10.0000 mg | ORAL_TABLET | Freq: Every day | ORAL | Status: DC
  Administered 2019-11-15 – 2019-11-16 (×2): 10 mg via ORAL
  Filled 2019-11-15 (×2): qty 1

## 2019-11-15 MED ORDER — GABAPENTIN 400 MG PO CAPS
800.0000 mg | ORAL_CAPSULE | Freq: Every day | ORAL | Status: DC
  Administered 2019-11-15 – 2019-11-16 (×2): 800 mg via ORAL
  Filled 2019-11-15 (×2): qty 2

## 2019-11-15 MED ORDER — APIXABAN 5 MG PO TABS
5.0000 mg | ORAL_TABLET | Freq: Two times a day (BID) | ORAL | Status: DC
  Administered 2019-11-15 – 2019-11-17 (×5): 5 mg via ORAL
  Filled 2019-11-15 (×6): qty 1

## 2019-11-15 MED ORDER — LISINOPRIL 20 MG PO TABS
40.0000 mg | ORAL_TABLET | Freq: Every day | ORAL | Status: DC
  Administered 2019-11-15 – 2019-11-17 (×3): 40 mg via ORAL
  Filled 2019-11-15 (×3): qty 2

## 2019-11-15 MED ORDER — METOPROLOL TARTRATE 25 MG PO TABS
25.0000 mg | ORAL_TABLET | Freq: Two times a day (BID) | ORAL | Status: DC
  Administered 2019-11-15 – 2019-11-17 (×4): 25 mg via ORAL
  Filled 2019-11-15 (×5): qty 1

## 2019-11-15 MED ORDER — BICTEGRAVIR-EMTRICITAB-TENOFOV 50-200-25 MG PO TABS
1.0000 | ORAL_TABLET | Freq: Every day | ORAL | Status: DC
  Administered 2019-11-15 – 2019-11-17 (×3): 1 via ORAL
  Filled 2019-11-15 (×3): qty 1

## 2019-11-15 NOTE — Progress Notes (Signed)
Patient meets criteria for inpatient treatment per Mordecai Maes, NP. No appropriate beds at [facility] currently. CSW faxed referrals to the following facilities for review:  [facility]  [facility]  [facility]  [facility]  TTS will continue to seek bed placement.     Darletta Moll MSW, [place] Worker Disposition  [facility] Ph: [phone] Fax: [phone] 11/15/2019 11:33 AM

## 2019-11-15 NOTE — BH Assessment (Addendum)
Assessment Note  [name] Herring is an 59 y.o. male who presented to [facility] after ingesting approximately 17 OTC sleeping pills because he states that he had not slept in 4-5 days.  Patient has been diagnosed with schizophrenia and has been an active patient in the local mental health system for the past forty years.  Patient denies that he is suicidal/Homicidal, but states that he persistently hears voices and sees things, but states that he can identify what is real and not real.  Patient states that he has not been hospitalized in the past ten years.  Patient states that he has no current mental health provider and states that he has been off his medications for the past four to five years.  Patient states that his appetite has been good.  He denies any history of self-mutilation.  TTS was able to find out that patient does have an ACT Team through [employer].  Patient was also positive for cocaine use despite his previous reporting that he has not used any drugs in the past five years.  His cocaine use is suspect to the reason that he has not been sleeping.  TTS contacted his ACT Team at [phone] and spoke to the on-call worker.  She stated that they have been having problems locating patient and felt like his SA issues have increased.  He has been receiving his medications from them, but has most likely been non-compliant with taking his medications.  He was due for an injection yesterday, but they could not locate him.  Due to the fact he is off his medications, he is using drugs and he is not stable at this point, [facility] felt like he would benefit from hospitalization.  Patient presented as alert and oriented.  His thoughts were organized, but his memory impaired or he had "selective memory."  Either way, patient was not totally honest during his assessment concerning his drug use and treatment history.  Patient stated that he hears and sees things all the time.  Patient has partial  insight, judgment and impulse control.  He was moving his head from sisd to side during his assessment and his anxiety appeared to be moderate.  Diagnosis: F20.9 Schizophrenia  Past Medical History:  Past Medical History:  Diagnosis Date  . Depression   . HIV (human immunodeficiency virus infection) ([place])   . Hypertension   . Immune deficiency disorder ([place])   . PE (pulmonary thromboembolism) ([place]) 2018  . Personality disorder ([place])   . Schizophrenia [facility])     Past Surgical History:  Procedure Laterality Date  . COLONOSCOPY WITH PROPOFOL N/A 06/15/2017   Procedure: COLONOSCOPY WITH PROPOFOL;  Surgeon: Irene Shipper, MD;  [facility];  Service: Endoscopy;  Laterality: N/A;  . ESOPHAGOGASTRODUODENOSCOPY (EGD) WITH PROPOFOL N/A 06/15/2017   Procedure: ESOPHAGOGASTRODUODENOSCOPY (EGD) WITH PROPOFOL;  Surgeon: Irene Shipper, MD;  [facility];  Service: Endoscopy;  Laterality: N/A;  . IR GENERIC HISTORICAL  11/04/2016   IR IVC FILTER PLMT / S&I Burke Keels GUID/MOD SED 11/04/2016 Aletta Edouard, MD [facility]-INTERV RAD    Family History:  Family History  Problem Relation Age of Onset  . CAD Mother   . Kidney disease Sister   . Lupus Brother   . Cancer Maternal Grandmother   . Stroke Paternal Grandmother     Social History:  reports that he has been smoking cigarettes. He has a 16.50 pack-year smoking history. He has never used smokeless tobacco. He reports previous alcohol use.  He reports current drug use. Drug: Cocaine.  Additional Social History:  Alcohol / Drug Use Pain Medications: None Prescriptions: see pta list Over the Counter: N/A History of alcohol / drug use?: Yes(patient denied use, but was positive for cocaine) Longest period of sobriety (when/how long): "8 years" Negative Consequences of Use: Financial, Personal relationships Withdrawal Symptoms: Other (Comment) Substance #1 Name of Substance 1: cocaine 1 - Age of First Use: UTA 1 - Amount (size/oz):  UTA-patient denied use of drugs 1 - Frequency: UTA 1 - Duration: UTA 1 - Last Use / Amount: UTA  CIWA: CIWA-Ar BP: 112/78 Pulse Rate: 76 COWS:    Allergies:  Allergies  Allergen Reactions  . Amoxicillin Shortness Of Breath and Rash    Has patient had a PCN reaction causing immediate rash, facial/tongue/throat swelling, [name] Has patient had a PCN reaction causing severe rash involving mucus membranes or skin necrosis: no Has patient had a PCN reaction that required hospitalization: yes drs office visit Has patient had a PCN reaction occurring within the last 10 years: no If all of the above answers are "NO", then may proceed with Cephalosporin use.   . Latex Shortness Of Breath  . Abilify [Aripiprazole] Other (See Comments)    Double vision    Home Medications: (Not in a hospital admission)   OB/GYN Status:  No LMP for male patient.  General Assessment Data Location of Assessment: [facility] ED TTS Assessment: In system Is this a Tele or Face-to-Face Assessment?: Tele Assessment Is this an Initial Assessment or a Re-assessment for this encounter?: Initial Assessment Patient Accompanied by:: N/A Language Other than English: No Living Arrangements: Other (Comment)(has own apartment) What gender do you identify as?: Male Marital status: Single Living Arrangements: Alone Can pt return to current living arrangement?: Yes Admission Status: Voluntary Is patient capable of signing voluntary admission?: Yes Referral Source: Self/Family/Friend Insurance type: [employer] [facility] Medicare     Crisis Care Plan Living Arrangements: Alone Legal Guardian: Other:(self) Name of Psychiatrist: [facility] Name of Therapist: [facility]  Education Status Is patient currently in school?: No Is the patient employed, unemployed or receiving disability?: Receiving disability income  Risk to self with the past 6 months Suicidal Ideation: No Has patient been a risk to self within  the past 6 months prior to admission? : No Suicidal Intent: No Has patient had any suicidal intent within the past 6 months prior to admission? : No Is patient at risk for suicide?: No Suicidal Plan?: No Has patient had any suicidal plan within the past 6 months prior to admission? : No Access to Means: No What has been your use of drugs/alcohol within the last 12 months?: (using cocaine) Previous Attempts/Gestures: Yes How many times?: (6) Other Self Harm Risks: psychosis Triggers for Past Attempts: None known Intentional Self Injurious Behavior: None Family Suicide History: No Recent stressful life event(s): Other (Comment)(addiction issues) Persecutory voices/beliefs?: No Depression: Yes Depression Symptoms: Insomnia, Isolating Substance abuse history and/or treatment for substance abuse?: Yes Suicide prevention information given to non-admitted patients: Not applicable  Risk to Others within the past 6 months Homicidal Ideation: No Does patient have any lifetime risk of violence toward others beyond the six months prior to admission? : No Thoughts of Harm to Others: No Current Homicidal Intent: No Current Homicidal Plan: No Access to Homicidal Means: No Identified Victim: none History of harm to others?: No Assessment of Violence: None Noted Violent Behavior Description: none Does patient have access to weapons?: No Criminal  Charges Pending?: No Does patient have a court date: No Is patient on probation?: No  Psychosis Hallucinations: Auditory, Visual Delusions: (states that he has delusions about objects)  Mental Status Report Appearance/Hygiene: Disheveled Eye Contact: Fair Motor Activity: Freedom of movement, Other (Comment)(moving head from side to side) Speech: Logical/coherent Level of Consciousness: Alert Mood: Anxious Affect: Anxious Anxiety Level: Moderate Thought Processes: Coherent, Relevant Judgement: Partial Orientation: Person, Place, Time,  Situation Obsessive Compulsive Thoughts/Behaviors: None  Cognitive Functioning Concentration: Normal Memory: Recent Intact, Remote Intact Is patient IDD: No Insight: Fair Impulse Control: Fair Appetite: Good Have you had any weight changes? : No Change Sleep: Decreased Total Hours of Sleep: (states that he has not slept in several days) Vegetative Symptoms: None  ADLScreening [facility] Assessment Services) Patient's cognitive ability adequate to safely complete daily activities?: Yes Patient able to express need for assistance with ADLs?: Yes Independently performs ADLs?: Yes (appropriate for developmental age)  Prior Inpatient Therapy Prior Inpatient Therapy: Yes Prior Therapy Dates: patient states 10 yeras ago Prior Therapy Facilty/Provider(s): [facility] Reason for Treatment: schizophrenia  Prior Outpatient Therapy Prior Outpatient Therapy: Yes Prior Therapy Dates: active Prior Therapy Facilty/Provider(s): [facility] Reason for Treatment: schizophrenia Does patient have an ACCT team?: Yes([facility]) Does patient have Intensive In-House Services?  : No Does patient have [facility] services? : No Does patient have P4CC services?: No  ADL Screening (condition at time of admission) Patient's cognitive ability adequate to safely complete daily activities?: Yes Is the patient deaf or have difficulty hearing?: No Does the patient have difficulty seeing, even when wearing glasses/contacts?: No Does the patient have difficulty concentrating, remembering, or making decisions?: No Patient able to express need for assistance with ADLs?: Yes Does the patient have difficulty dressing or bathing?: No Independently performs ADLs?: Yes (appropriate for developmental age) Does the patient have difficulty walking or climbing stairs?: No Weakness of Legs: None Weakness of Arms/Hands: None  Home Assistive Devices/Equipment Home Assistive Devices/Equipment: None  Therapy Consults (therapy consults require a  physician order) PT Evaluation Needed: No OT Evalulation Needed: No SLP Evaluation Needed: No Abuse/Neglect Assessment (Assessment to be complete while patient is alone) Abuse/Neglect Assessment Can Be Completed: Yes Physical Abuse: Denies Verbal Abuse: Denies Sexual Abuse: Denies Exploitation of patient/patient's resources: Denies Self-Neglect: Denies Values / Beliefs Cultural Requests During Hospitalization: None Spiritual Requests During Hospitalization: None Consults Spiritual Care Consult Needed: No Transition of Care Team Consult Needed: No Advance Directives (For Healthcare) Does Patient Have a Medical Advance Directive?: No Would patient like information on creating a medical advance directive?: No - Patient declined Nutrition Screen- [facility] Adult/[facility]/AP Has the patient recently lost weight without trying?: No Has the patient been eating poorly because of a decreased appetite?: No Malnutrition Screening Tool Score: 0        Disposition: Patient was seen by TTS tele-monitor and was staffed with Mordecai Maes, NP and recommended for inpatient treatment.   Disposition Initial Assessment Completed for this Encounter: Yes  On Site Evaluation by:   Reviewed with Physician:    Judeth Porch Caden Fukushima 11/15/2019 10:47 AM

## 2019-11-15 NOTE — ED Notes (Signed)
[facility] called inquiring about pt. Advised may possibly accept pt if he can bring his own HIV meds. Pt states he did not bring them w/him and does not have anyone who can pick them up and bring them to the ED. Pt aware mental health facilities will more than likely not be able to admit him w/these meds. Pt voiced understanding. Will advise [place] SW.

## 2019-11-15 NOTE — ED Notes (Addendum)
ALL Belongings inventoried - 3 labeled belongings bags - placed in [place] #1 - No Valuables Envelope - Debit Visa, [place] ID, and cell phone in pants pocket. 3 keys on black string noted as well.

## 2019-11-15 NOTE — ED Provider Notes (Signed)
[facility] EMERGENCY DEPARTMENT Provider Note   CSN: [id number] Arrival date & time: 11/14/19  2052     History Chief Complaint  Patient presents with  . Drug Overdose  . Insomnia    [name] Herring is a 59 y.o. male with schizophrenia and bipolar disorder presenting for drug overdose. States he took 62 of his neighbor's sleeping pills--unsure of what they were. He states that he has been unable to sleep which he why he took them. he is unable to tell me when the last time he slept was.  Denies suicidal/homicidal intentions. Notes that he was previously on several medications for his schizophrenia however does not take them because of side effects.  He also states that he has a skin ulcer on his abdomen for which a surgeon placed some staples and dressed it a few weeks ago. Upon looking at the abdomen, there is no wound.     Past Medical History:  Diagnosis Date  . Depression   . HIV (human immunodeficiency virus infection) ([place])   . Hypertension   . Immune deficiency disorder ([place])   . PE (pulmonary thromboembolism) ([place]) 2018  . Personality disorder ([place])   . Schizophrenia [facility])     Patient Active Problem List   Diagnosis Date Noted  . Genital warts 10/16/2019  . GERD with esophagitis 01/22/2019  . Protein C deficiency ([place]) 07/05/2017  . Benign neoplasm of transverse colon   . Emphysema lung ([place]) 11/20/2016  . DVT of lower extremity, bilateral ([place]) 11/04/2016  . Tobacco abuse 11/02/2016  . PE (pulmonary thromboembolism) ([place]) 11/02/2016  . HTN (hypertension) 08/25/2015  . Alcohol use disorder, severe, in early remission ([place]) 12/27/2014  . Human immunodeficiency virus (HIV) disease ([place]) 11/09/2006  . CA IN SITU, RECTUM 11/09/2006  . Hypothyroidism 11/09/2006  . Paranoid schizophrenia ([place]) 11/09/2006  . DISORDER, BIPOLAR NOS 11/09/2006  . Depression 11/09/2006    Past Surgical History:  Procedure Laterality Date  . COLONOSCOPY WITH  PROPOFOL N/A 06/15/2017   Procedure: COLONOSCOPY WITH PROPOFOL;  Surgeon: Irene Shipper, MD;  [facility];  Service: Endoscopy;  Laterality: N/A;  . ESOPHAGOGASTRODUODENOSCOPY (EGD) WITH PROPOFOL N/A 06/15/2017   Procedure: ESOPHAGOGASTRODUODENOSCOPY (EGD) WITH PROPOFOL;  Surgeon: Irene Shipper, MD;  [facility];  Service: Endoscopy;  Laterality: N/A;  . IR GENERIC HISTORICAL  11/04/2016   IR IVC FILTER PLMT / S&I [facility] Keels GUID/MOD SED 11/04/2016 Aletta Edouard, MD [facility]-INTERV RAD       Family History  Problem Relation Age of Onset  . CAD Mother   . Kidney disease Sister   . Lupus Brother   . Cancer Maternal Grandmother   . Stroke Paternal Grandmother     Social History   Tobacco Use  . Smoking status: Current Every Day Smoker    Packs/day: 0.50    Years: 33.00    Pack years: 16.50    Types: Cigarettes    Last attempt to quit: 04/25/2019    Years since quitting: 0.5  . Smokeless tobacco: Never Used  Substance Use Topics  . Alcohol use: Not Currently    Comment: quit 2020  . Drug use: Not Currently    Comment: quit using 2020    Home Medications Prior to Admission medications   Medication Sig Start Date End Date Taking? Authorizing Provider  acetaminophen (TYLENOL) 500 MG tablet Take 1 tablet (500 mg total) by mouth every 6 (six) hours as needed for moderate pain. Patient taking differently: Take 1,000  mg by mouth every 6 (six) hours as needed for moderate pain.  11/07/16  Yes Eugenie Filler, MD  benztropine (COGENTIN) 1 MG tablet 1 tab in the morning 2 tabs at night Patient taking differently: Take 1-2 mg by mouth See admin instructions. Take 1 tablet in the morning and take 2 tablets at night 11/10/16  Yes McClung, Angela M, PA-C  bictegravir-emtricitabine-tenofovir AF (BIKTARVY) 50-200-25 MG TABS tablet TAKE ONE TABLET BY MOUTH DAILY. TRY TO TAKE AT THE SAME TIME EACH DAY WITH OR WITHOUT FOOD. Patient taking differently: Take 1 tablet by mouth daily.   06/10/19  Yes [place] Callas, NP  butalbital-acetaminophen-caffeine (ESGIC) 50-325-40 MG tablet Take 1 tablet by mouth 2 (two) times daily as needed for headache. 02/26/18  Yes Newlin, Enobong, MD  ELIQUIS 5 MG TABS tablet TAKE ONE TABLET BY MOUTH TWICE DAILY Patient taking differently: Take 5 mg by mouth 2 (two) times daily.  11/04/19  Yes Charlott Rakes, MD  fluticasone (FLONASE) 50 MCG/ACT nasal spray Place 1 spray into both nostrils daily as needed for allergies.  10/03/19  Yes [provider]  gabapentin (NEURONTIN) 800 MG tablet Take 800 mg by mouth at bedtime. 10/03/19  Yes [provider]  lisinopril (ZESTRIL) 40 MG tablet Take 40 mg by mouth daily. 07/30/19  Yes [provider]  magnesium oxide (MAG-OX) 400 (241.3 Mg) MG tablet Take 1 tablet (400 mg total) by mouth 2 (two) times daily. 11/07/16  Yes Eugenie Filler, MD  Melatonin 3-10 MG TABS Take 1 tablet by mouth at bedtime. 06/25/19  Yes [provider]  metoprolol tartrate (LOPRESSOR) 25 MG tablet Take 1 tablet (25 mg total) by mouth 2 (two) times daily. 08/07/18  Yes Charlott Rakes, MD  mirtazapine (REMERON) 30 MG tablet Take 1 tablet (30 mg total) by mouth at bedtime. 06/20/17  Yes [place] Callas, NP  montelukast (SINGULAIR) 10 MG tablet Take 10 mg by mouth at bedtime. 10/02/19  Yes [provider]  naltrexone (DEPADE) 50 MG tablet Take 25 mg by mouth daily. 08/22/19  Yes [provider]  pantoprazole (PROTONIX) 40 MG tablet Take 40 mg by mouth daily.  05/29/19  Yes [provider]  polyethylene glycol (MIRALAX / GLYCOLAX) packet Take 17 g by mouth daily as needed for moderate constipation.    Yes [provider]  QUEtiapine (SEROQUEL) 400 MG tablet Take 400 mg by mouth at bedtime.  03/27/19  Yes [provider]  SYMBICORT 160-4.5 MCG/ACT inhaler Inhale 2 puffs into the lungs 2 (two) times daily. 10/03/19  Yes [provider]  tamsulosin (FLOMAX) 0.4 MG  CAPS capsule Take 1 capsule (0.4 mg total) by mouth daily after supper. 09/17/19  Yes [place] Callas, NP  tiotropium (SPIRIVA) 18 MCG inhalation capsule Place 1 capsule (18 mcg total) into inhaler and inhale daily. 10/24/19  Yes Charlott Rakes, MD  traZODone (DESYREL) 50 MG tablet Take 1 tablet (50 mg total) by mouth at bedtime. 08/07/18  Yes Charlott Rakes, MD  Valbenazine Tosylate (INGREZZA) 80 MG CAPS Take 80 mg by mouth 2 (two) times daily.    Yes [provider]  cetirizine (ZYRTEC) 10 MG tablet Take 1 tablet (10 mg total) by mouth daily. Patient not taking: Reported on 10/22/2019 02/26/18   Charlott Rakes, MD  chlordiazePOXIDE (LIBRIUM) 25 MG capsule 50mg  PO TID x 1D, then 25-50mg  PO BID X 1D, then 25-50mg  PO QD X 1D Patient not taking: Reported on 10/22/2019 09/04/19   Evette Cristal,  Lindsey A, PA-C  haloperidol (HALDOL) 10 MG tablet Take 0.5 tablets (5 mg total) by mouth 2 (two) times daily. Patient not taking: Reported on 10/22/2019 06/18/17   Domenic Polite, MD  Multiple Vitamin (MULTIVITAMIN WITH MINERALS) TABS tablet Take 1 tablet by mouth daily. Patient not taking: Reported on 09/04/2019 11/08/16   Eugenie Filler, MD  pantoprazole (PROTONIX) 20 MG tablet Take 1 tablet (20 mg total) by mouth daily at 6 PM. Take 30 minutes before dinner on an empty stomach. Patient not taking: Reported on 10/22/2019 04/11/19   Campbell Riches, MD    Allergies    Amoxicillin, Latex, and Abilify [aripiprazole]  Review of Systems   Review of Systems  Constitutional: Negative for chills and fever.  HENT: Negative.   Respiratory: Negative.   Cardiovascular: Negative.   Gastrointestinal: Negative.   Endocrine: Negative.   Genitourinary: Negative.   Musculoskeletal: Negative.   Skin: Negative.   Neurological: Positive for dizziness.  Psychiatric/Behavioral: Positive for hallucinations and sleep disturbance. Negative for agitation.    Physical Exam Updated Vital Signs BP 97/75 (BP  Location: Left Arm)   Pulse 92   Temp 98.2 F (36.8 C) (Oral)   Resp 14   SpO2 98%   Physical Exam Constitutional:      General: He is in acute distress.  HENT:     Head: Atraumatic.     Mouth/Throat:     Mouth: Mucous membranes are moist.  Eyes:     Extraocular Movements: Extraocular movements intact.     Pupils: Pupils are equal, round, and reactive to light.  Cardiovascular:     Rate and Rhythm: Normal rate and regular rhythm.  Pulmonary:     Effort: Pulmonary effort is normal.     Breath sounds: Normal breath sounds.  Abdominal:     General: Abdomen is flat. Bowel sounds are normal.  Musculoskeletal:        General: No signs of injury.  Skin:    General: Skin is warm and dry.  Neurological:     General: No focal deficit present.     Mental Status: He is alert.  Psychiatric:     Comments: Denies suicidal/homicidal thoughts or plan. Appears to have good insight into his diseases. During exam, he pointed to his periumbilical area and implied he was seeing a wound there. His suggestion of having an abdominal wound (which is not there) suggests visual hallucination. Non-pressured speech.     ED Results / Procedures / Treatments   Labs (all labs ordered are listed, but only abnormal results are displayed) Labs Reviewed  COMPREHENSIVE METABOLIC PANEL - Abnormal; Notable for the following components:      Result Value   CO2 20 (*)    Glucose, Bld 109 (*)    BUN 26 (*)    Creatinine, Ser 1.53 (*)    GFR calc non Af Amer 49 (*)    GFR calc Af Amer 57 (*)    All other components within normal limits  SALICYLATE LEVEL - Abnormal; Notable for the following components:   Salicylate Lvl [id number] (*)    All other components within normal limits  CBC - Abnormal; Notable for the following components:   MCV 105.5 (*)    MCH 35.7 (*)    All other components within normal limits  CBG MONITORING, ED - Abnormal; Notable for the following components:   Glucose-Capillary 105 (*)     All other components within normal limits  ETHANOL  ACETAMINOPHEN LEVEL  RAPID URINE DRUG SCREEN, HOSP PERFORMED  TSH    EKG None  Radiology No results found.  Medications Ordered in ED Medications - No data to display  ED Course  I have reviewed the triage vital signs and the nursing notes.  Pertinent labs & imaging results that were available during my care of the patient were reviewed by me and considered in my medical decision making (see chart for details).    MDM Rules/Calculators/A&P    59 yo male with PMH of untreated bipolar disorder and schizophrenia who is presenting >24h following ingestion of 12 of his neighbor's sleeping pills.  Given the length of time since ingestion along with normal EKG/labs, would not suspect his to develop an arrhtymia at this point.  His untreated psychiatric conditions are more concerning in the context of being unable to sleep even after reporting OD on his neighbor's sleeping pills. Concern for manic episode due to the insomnia however he is not really displaying any other manic behaviors. The visual hallucination about the abdominal wound is possibly attributable to untreated schizophrenia. UDS was negative so don't suspect this to be drug related.  Consult placed to TTS for further evaluation for need of inpatient treatment if they feel he is manic.  Cardiac monitoring. Will continue to monitor. Final Clinical Impression(s) / ED Diagnoses Final diagnoses:  Accidental drug overdose, initial encounter  Insomnia, unspecified type  Schizophrenia, unspecified type ([place])  Bipolar affective disorder, remission status unspecified [facility])    Rx / [place] Orders ED Discharge Orders    None       Mitzi Hansen, MD 11/15/19 [id number]    Merrily Pew, MD 11/15/19 [id number]

## 2019-11-15 NOTE — ED Notes (Signed)
BH is aware of need for TTS.

## 2019-11-15 NOTE — Progress Notes (Signed)
Patient ID: [name] Herring, male   DOB: 1960/12/10, 59 y.o.   MRN: [id number]  I spoke with Beverlee Nims from patients ACT team [phone]. She stated that she was out and would turn around to get patients medications and drop them off at [facility] ED.

## 2019-11-15 NOTE — Progress Notes (Signed)
CSW spoke with admissions at [facility] to inform them that pt's medical meds were being dropped off for pt.    Darletta Moll MSW, [place] Worker Disposition  [facility] Ph: [phone] Fax: [phone]

## 2019-11-15 NOTE — ED Notes (Addendum)
Pt gave Beverlee Nims verbal authorization via phone to come pick up his house keys and bring meds to ED. Keys given to Security in ED Lobby for Beverlee Nims to pick up and bring back w/pt's meds.

## 2019-11-15 NOTE — ED Notes (Signed)
PT able to ambulate with a shuffled gate to the bathroom with minimal/standby assist for safety. Patient able to stand, and maintain balance while urinating. Patient able to return to bed from bathroom with minimal/standby assist. PT maintained Spo2 of 95-97% RA while ambulating, and 98% RA lying in bed.

## 2019-11-15 NOTE — ED Notes (Signed)
Breakfast ordered 

## 2019-11-15 NOTE — ED Notes (Signed)
Called [facility] assessment dept.  They stated they are behind, but the pt is on the list.

## 2019-11-15 NOTE — ED Provider Notes (Signed)
8:29 AM patient presented to the emergency department last night after overdose on sleeping pills.  Reviewed lab work.  Patient with mild elevation in creatinine, likely due to dehydration.  Patient was given a fluid bolus.  Patient seen and evaluated.  He is sitting up in bed, awake and alert, eating breakfast.  He is drinking as well.  Reviewed lab work.  Patient is medically cleared.  Awaiting TTS evaluation.  Tachycardic on arrival, now improved.  BP 112/78 (BP Location: Left Arm)   Pulse 76   Temp 97.7 F (36.5 C) (Oral)   Resp 16   SpO2 100%     Carlisle Cater, PA-C 11/15/19 0830    Pattricia Boss, MD 11/16/19 1550

## 2019-11-15 NOTE — ED Notes (Signed)
Left message for [name] Herring [phone] - inquiring if she was able to get pt's HIV med and bring to ED.

## 2019-11-15 NOTE — ED Notes (Addendum)
Beverlee Nims brought pt's med - Biktarvy - to ED. 30 tabs noted in unopened bottle. Keys placed back into pt's belongings bag in [facility]. States pt has not been taking Valbenazine as they have been experiencing difficulty getting for pt.

## 2019-11-16 NOTE — ED Notes (Signed)
Home med taken to Pharmacy.

## 2019-11-16 NOTE — [facility] Counselor (Signed)
  REASSESSMENT  [place] reevaluated pt for safety and stability.  Pt was observed laying in the bed, alert, and oriented x2.  Pt denies HI; but admits to having auditory hallucinations with suicidal commands and visual hallucinations.  Pt states, "I always hear voices; but they have gotten louder.   I know the voices are not real but I can't make them stop and I can't resist the urge.  The voices are telling me to hang myself. Last night I was seeing stuff flying around my room.  I want them to stop; but I stopped taking my medicine 3 weeks ago."  Pt continues to meet inpatient criteria A/V-hallucinations.  TTS will continue to look for inpatient placement.  Naveen Lorusso L. Layman Gully, MS, [facility], [facility] Therapeutic Triage Specialist  [phone] ;d

## 2019-11-16 NOTE — Progress Notes (Signed)
CSW spoke with Christinia Gully at [facility] who reported to call back later in the afternoon to speak with intake staff regarding possible placement for pt.     Darletta Moll MSW, [place] Worker Disposition  [facility] Ph: [phone] Fax: [phone]

## 2019-11-16 NOTE — ED Provider Notes (Signed)
Emergency department psychiatric rounding note  59 year old male who has been in this apartment for over 35 hours at this time.  Per chart review patient took 34 of his neighbors sleeping pills greater than 24 hours prior to arrival.  This is believed to be an accidental overdose by previous team.  Concern for manic episode and untreated schizophrenia.  Patient is currently awaiting transfer to [facility] [place]. Physical Exam  BP 102/73 (BP Location: Left Arm)   Pulse (!) 57   Temp 97.7 F (36.5 C) (Oral)   Resp 17   SpO2 97%   Physical Exam Constitutional:      General: He is not in acute distress.    Appearance: Normal appearance. He is not ill-appearing.  HENT:     Head: Normocephalic and atraumatic.     Right Ear: External ear normal.     Left Ear: External ear normal.     Nose: Nose normal.  Eyes:     General: Vision grossly intact. Gaze aligned appropriately.  Pulmonary:     Effort: Pulmonary effort is normal. No respiratory distress.  Musculoskeletal:        General: Normal range of motion.     Cervical back: Normal range of motion.  Skin:    General: Skin is warm and dry.  Neurological:     General: No focal deficit present.     Mental Status: He is alert and oriented to person, place, and time.  Psychiatric:        Mood and Affect: Mood normal.        Speech: Speech normal.        Behavior: Behavior normal. Behavior is cooperative.     ED Course/Procedures     Procedures  MDM  Lab review:  Covid/flu panel negative TSH within normal limits UDS negative CBG 105 Tylenol level 20 Ethanol level negative Salicylate level negative CBC with mild elevation of creatinine, patient was given fluid bolus suspect this to be from dehydration CBC without acute findings  Most recent vital signs from 5 AM this morning: Temperature 97.7 F, pulse 57 bpm, blood pressure 102/73, respiratory rate 17, SPO2 97% on room air - Per RN no acute overnight events requiring  intervention.  I evaluated the patient at 8:25 AM this morning, he was sleeping comfortably in bed no acute distress, he is easily arousable to voice.  He reports that he is feeling well at this time.  He reports that he enjoyed breakfast a few minutes ago and was feeling well.  He had no questions or concerns for me.  At this time there does not appear to be any evidence of an acute emergency medical condition patient remains medically cleared awaiting transfer.  Note: Portions of this report may have been transcribed using voice recognition software. Every effort was made to ensure accuracy; however, inadvertent computerized transcription errors may still be present.   Gari Crown 11/16/19 [id number]    Lucrezia Starch, MD 11/18/19 [phone]

## 2019-11-16 NOTE — Progress Notes (Signed)
CSW spoke with admissions at [facility] who stated they could not locate pt's referral. CSW was told that currently there were no male beds available, however they should have availability tomorrow 11/17/19.  CSW re-faxed referral.   Darletta Moll MSW, [place] Worker Disposition  [facility] Ph: [phone] Fax: [phone]

## 2019-11-16 NOTE — ED Notes (Signed)
Ordered breakfast--[name] Herring 

## 2019-11-17 ENCOUNTER — Ambulatory Visit: Payer: Medicare PPO | Admitting: Infectious Diseases

## 2019-11-17 NOTE — ED Notes (Signed)
Safe transport contacted to transport pt to [facility]

## 2019-11-17 NOTE — ED Notes (Signed)
Pt currently sleeping on side in hospital bed. Rise and fall of chest noted. Sitter present.

## 2019-11-17 NOTE — Progress Notes (Signed)
Pt accepted to [facility]     Dr. Alcide Clever is the attending provider.    Call report to [phone]  April @ [facility] ED notified.     Pt is voluntary and will be transported by [place].  Pt is scheduled to arrive at [facility] at [place], [place], [place] Disposition [facility]/TTS [phone]

## 2019-11-17 NOTE — ED Notes (Signed)
inpt   Breakfast ordered

## 2019-11-17 NOTE — ED Notes (Signed)
Ordered diet tray 

## 2019-12-24 ENCOUNTER — Telehealth: Payer: Self-pay | Admitting: *Deleted

## 2019-12-24 NOTE — Telephone Encounter (Signed)
Review of last ED notes stated the patient was placed at [facility] and care and rehabilitation. Call placed today and I did not receive an answer. I will continue to try and reach him.

## 2020-01-11 ENCOUNTER — Telehealth: Payer: Self-pay | Admitting: *Deleted

## 2020-01-11 NOTE — Telephone Encounter (Signed)
Initial Assessment Points to Consider for Care             Points are not all inclusive to services and educated provided but supports the patient's Individualized Plan of Care.   Is Home safe for visits? Yes / Are all firearms or weapons secure? yes  Insurance Coverage: Medicare  1st HIV Diagnosis: Nov 1999  Mode of HIV Transmission:Risk factors-MSM or drug use  Functional Status: able to perform ADL's independently with additional time given  Current Housing/Needs: no housing needs at this time, lives in an apartment alone   Social Support/System: Dtr who lives in [place] in [place]) no longer sister(Barbara)  Culture/Religion/Spirituality:none noted that would create barriers to care  Educational Background:   Legal Issues: none noted  Access/Utilization of Community Resources:27% no show rate  Mental Health Concerns/Diagnosis: Schizoaffective disorder  Alcohol and/or Drug Use: Hx of cocaine abuse, current alcohol abuse  Risk and Knowledge of HIV and Reduction in Transmission:  Very knowledgeable able mode of transmission and ways to reduce transmission Nutritional Needs: not at this time    Frequency / Duration of [place] visits: Effective 01/15/2020  27mo4  4PRN's for complications with disease process/progression, medication changes or concerns   [facility] will assess for learning needs related to diagnosis and treatment regimen, provide education as needed, fill pill box if needed, address any barriers which may be preventing medication compliance, and communicating with care team including physician and case manager.   Individualized Plan Of Care with Certification Period from 01/15/20 to 04/14/20             a. Type of service(s) and care to be delivered: RN Case Management             b. Frequency and duration of service: Effective 01/15/20  83mo4, 4 prns for complications with disease process/progression, medication changes or concerns . Visits/Contact may be  conducted   telephonically or in person to best suit the patient.             c. Activity restrictions: Pt may be up as tolerated and can safely ambulate without the need for a assistive device. Additional time does need to be given to complete task              d. Safety Measures: Standard Precautions/Infection Control              e. Service Objectives and Goals: Service Objectives are to assist the pt with HIV medication regimen adherence and staying in care with the Infect[facility] Disease [facility] by identifying barriers to care. RN will address the barriers that are identified by the patient. Current the patient is capable to taking his own medications but states he would like help and reminders with his care. Patient states he is struggling with alcoholism and thoughts of abusing drugs and would like to have a support person to engage with him. Declines rehab at this time               f. Equipment required: No additional equipment needs at this time              g. Functional Limitations: Vision. Pt has corrective glasses that he wears              h. Rehabilitation potential: Guarded              i. Diet and Nutritional Needs: Regular Diet  j. Medications and treatments: Medications have been reconciled and reviewed and are a part of EPIC electronic file              k. Specific therapies if needed: RN              l. Pertinent diagnoses: HIV disease,  Hx of medication NonCompliance, Schizoaffective disorder             m. Expected outcome: Guarded

## 2020-01-12 NOTE — Telephone Encounter (Signed)
I agree with the plan of care outlined by Kinnie Scales as outlined below. Greatly appreciate her help!

## 2020-01-15 ENCOUNTER — Other Ambulatory Visit: Payer: Self-pay

## 2020-01-15 ENCOUNTER — Emergency Department (HOSPITAL_COMMUNITY): Payer: Medicare Other

## 2020-01-15 ENCOUNTER — Inpatient Hospital Stay (HOSPITAL_COMMUNITY): Payer: Medicare Other

## 2020-01-15 ENCOUNTER — Inpatient Hospital Stay (HOSPITAL_COMMUNITY)
Admission: EM | Admit: 2020-01-15 | Discharge: 2020-01-19 | DRG: 640 | Disposition: A | Discharge status: Home Health | Payer: Medicare Other | Attending: Internal Medicine | Admitting: Internal Medicine

## 2020-01-15 DIAGNOSIS — E860 Dehydration: Secondary | ICD-10-CM

## 2020-01-15 DIAGNOSIS — W19XXXA Unspecified fall, initial encounter: Principal | ICD-10-CM

## 2020-01-15 DIAGNOSIS — B20 Human immunodeficiency virus [HIV] disease: Secondary | ICD-10-CM

## 2020-01-15 DIAGNOSIS — N179 Acute kidney failure, unspecified: Secondary | ICD-10-CM

## 2020-01-15 DIAGNOSIS — Z86711 Personal history of pulmonary embolism: Secondary | ICD-10-CM

## 2020-01-15 DIAGNOSIS — R55 Syncope and collapse: Secondary | ICD-10-CM

## 2020-01-15 DIAGNOSIS — Z95828 Presence of other vascular implants and grafts: Secondary | ICD-10-CM

## 2020-01-15 DIAGNOSIS — Z881 Allergy status to other antibiotic agents status: Secondary | ICD-10-CM

## 2020-01-15 DIAGNOSIS — I959 Hypotension, unspecified: Secondary | ICD-10-CM | POA: Diagnosis present

## 2020-01-15 DIAGNOSIS — D696 Thrombocytopenia, unspecified: Secondary | ICD-10-CM | POA: Diagnosis present

## 2020-01-15 DIAGNOSIS — E876 Hypokalemia: Secondary | ICD-10-CM | POA: Diagnosis present

## 2020-01-15 DIAGNOSIS — I10 Essential (primary) hypertension: Secondary | ICD-10-CM | POA: Diagnosis present

## 2020-01-15 DIAGNOSIS — Z7901 Long term (current) use of anticoagulants: Secondary | ICD-10-CM

## 2020-01-15 DIAGNOSIS — Z7289 Other problems related to lifestyle: Secondary | ICD-10-CM

## 2020-01-15 DIAGNOSIS — Z6820 Body mass index (BMI) 20.0-20.9, adult: Secondary | ICD-10-CM

## 2020-01-15 DIAGNOSIS — Z86718 Personal history of other venous thrombosis and embolism: Secondary | ICD-10-CM | POA: Diagnosis not present

## 2020-01-15 DIAGNOSIS — Z7951 Long term (current) use of inhaled steroids: Secondary | ICD-10-CM

## 2020-01-15 DIAGNOSIS — R64 Cachexia: Secondary | ICD-10-CM | POA: Diagnosis present

## 2020-01-15 DIAGNOSIS — F329 Major depressive disorder, single episode, unspecified: Secondary | ICD-10-CM | POA: Diagnosis present

## 2020-01-15 DIAGNOSIS — F17210 Nicotine dependence, cigarettes, uncomplicated: Secondary | ICD-10-CM | POA: Diagnosis present

## 2020-01-15 DIAGNOSIS — Z21 Asymptomatic human immunodeficiency virus [HIV] infection status: Secondary | ICD-10-CM | POA: Diagnosis present

## 2020-01-15 DIAGNOSIS — F209 Schizophrenia, unspecified: Secondary | ICD-10-CM | POA: Diagnosis present

## 2020-01-15 DIAGNOSIS — E43 Unspecified severe protein-calorie malnutrition: Secondary | ICD-10-CM | POA: Diagnosis present

## 2020-01-15 DIAGNOSIS — Z79899 Other long term (current) drug therapy: Secondary | ICD-10-CM | POA: Diagnosis not present

## 2020-01-15 DIAGNOSIS — Z20822 Contact with and (suspected) exposure to covid-19: Secondary | ICD-10-CM | POA: Diagnosis present

## 2020-01-15 DIAGNOSIS — N170 Acute kidney failure with tubular necrosis: Secondary | ICD-10-CM | POA: Diagnosis present

## 2020-01-15 LAB — GLUCOSE, CAPILLARY: Glucose-Capillary: 102 mg/dL — ABNORMAL HIGH (ref 70–99)

## 2020-01-15 LAB — URINALYSIS, ROUTINE W REFLEX MICROSCOPIC
Bilirubin Urine: NEGATIVE
Glucose, UA: NEGATIVE mg/dL
Hgb urine dipstick: NEGATIVE
Ketones, ur: NEGATIVE mg/dL
Leukocytes,Ua: NEGATIVE
Nitrite: NEGATIVE
Protein, ur: 30 mg/dL — AB
Specific Gravity, Urine: 1.027 (ref 1.005–1.030)
pH: 5 (ref 5.0–8.0)

## 2020-01-15 LAB — CBC WITH DIFFERENTIAL/PLATELET
Abs Immature Granulocytes: 0.03 10*3/uL (ref 0.00–0.07)
Basophils Absolute: 0 10*3/uL (ref 0.0–0.1)
Basophils Relative: 0 %
Eosinophils Absolute: 0 10*3/uL (ref 0.0–0.5)
Eosinophils Relative: 0 %
HCT: 45.6 % (ref 39.0–52.0)
Hemoglobin: 14.6 g/dL (ref 13.0–17.0)
Immature Granulocytes: 0 %
Lymphocytes Relative: 11 %
Lymphs Abs: 0.9 10*3/uL (ref 0.7–4.0)
MCH: 34.3 pg — ABNORMAL HIGH (ref 26.0–34.0)
MCHC: 32 g/dL (ref 30.0–36.0)
MCV: 107 fL — ABNORMAL HIGH (ref 80.0–100.0)
Monocytes Absolute: 0.8 10*3/uL (ref 0.1–1.0)
Monocytes Relative: 11 %
Neutro Abs: 6.2 10*3/uL (ref 1.7–7.7)
Neutrophils Relative %: 78 %
Platelets: DECREASED 10*3/uL (ref 150–400)
RBC: 4.26 MIL/uL (ref 4.22–5.81)
RDW: 13.1 % (ref 11.5–15.5)
WBC: 8 10*3/uL (ref 4.0–10.5)
nRBC: 0 % (ref 0.0–0.2)

## 2020-01-15 LAB — NA AND K (SODIUM & POTASSIUM), RAND UR
Potassium Urine: 72 mmol/L
Sodium, Ur: 37 mmol/L

## 2020-01-15 LAB — TROPONIN I (HIGH SENSITIVITY)
Troponin I (High Sensitivity): 11 ng/L (ref <18)
Troponin I (High Sensitivity): 9 ng/L (ref <18)

## 2020-01-15 LAB — BASIC METABOLIC PANEL
Anion gap: 13 (ref 5–15)
BUN: 67 mg/dL — ABNORMAL HIGH (ref 6–20)
CO2: 21 mmol/L — ABNORMAL LOW (ref 22–32)
Calcium: 8.7 mg/dL — ABNORMAL LOW (ref 8.9–10.3)
Chloride: 113 mmol/L — ABNORMAL HIGH (ref 98–111)
Creatinine, Ser: 6.69 mg/dL — ABNORMAL HIGH (ref 0.61–1.24)
GFR calc Af Amer: 10 mL/min — ABNORMAL LOW (ref >60)
GFR calc non Af Amer: 8 mL/min — ABNORMAL LOW (ref >60)
Glucose, Bld: 128 mg/dL — ABNORMAL HIGH (ref 70–99)
Potassium: 3.8 mmol/L (ref 3.5–5.1)
Sodium: 147 mmol/L — ABNORMAL HIGH (ref 135–145)

## 2020-01-15 LAB — MRSA PCR SCREENING: MRSA by PCR: NEGATIVE

## 2020-01-15 LAB — CREATININE, URINE, RANDOM: Creatinine, Urine: 312.17 mg/dL

## 2020-01-15 LAB — CK: Total CK: 334 U/L (ref 49–397)

## 2020-01-15 LAB — SARS CORONAVIRUS 2 (TAT 6-24 HRS): SARS Coronavirus 2: NEGATIVE

## 2020-01-15 MED ORDER — SODIUM CHLORIDE 0.9 % IV SOLN
INTRAVENOUS | Status: DC
  Administered 2020-01-15: 1000 mL via INTRAVENOUS

## 2020-01-15 MED ORDER — ACETAMINOPHEN 650 MG RE SUPP: 650.0000 mg | Freq: Four times a day (QID) | RECTAL | Status: DC | PRN

## 2020-01-15 MED ORDER — TAMSULOSIN HCL 0.4 MG PO CAPS
0.4000 mg | ORAL_CAPSULE | Freq: Every evening | ORAL | Status: DC
  Administered 2020-01-15 – 2020-01-18 (×4): 0.4 mg via ORAL
  Filled 2020-01-15 (×4): qty 1

## 2020-01-15 MED ORDER — PANTOPRAZOLE SODIUM 40 MG PO TBEC
40.0000 mg | DELAYED_RELEASE_TABLET | Freq: Every day | ORAL | Status: DC
  Administered 2020-01-15 – 2020-01-19 (×5): 40 mg via ORAL
  Filled 2020-01-15 (×5): qty 1

## 2020-01-15 MED ORDER — TIOTROPIUM BROMIDE MONOHYDRATE 18 MCG IN CAPS: 1.0000 | ORAL_CAPSULE | Freq: Every day | RESPIRATORY_TRACT | Status: DC

## 2020-01-15 MED ORDER — SODIUM CHLORIDE 0.9 % IV BOLUS
1000.0000 mL | Freq: Once | INTRAVENOUS | Status: AC
  Administered 2020-01-15: 1000 mL via INTRAVENOUS

## 2020-01-15 MED ORDER — ACETAMINOPHEN 325 MG PO TABS
650.0000 mg | ORAL_TABLET | Freq: Four times a day (QID) | ORAL | Status: DC | PRN
  Administered 2020-01-16 – 2020-01-19 (×6): 650 mg via ORAL
  Filled 2020-01-15 (×7): qty 2

## 2020-01-15 MED ORDER — MELATONIN 3 MG PO TABS
3.0000 mg | ORAL_TABLET | Freq: Every day | ORAL | Status: DC
  Administered 2020-01-15 – 2020-01-18 (×4): 9 mg via ORAL
  Filled 2020-01-15 (×5): qty 3

## 2020-01-15 MED ORDER — MOMETASONE FURO-FORMOTEROL FUM 200-5 MCG/ACT IN AERO
2.0000 | INHALATION_SPRAY | Freq: Two times a day (BID) | RESPIRATORY_TRACT | Status: DC
  Administered 2020-01-15 – 2020-01-19 (×8): 2 via RESPIRATORY_TRACT
  Filled 2020-01-15: qty 8.8

## 2020-01-15 MED ORDER — UMECLIDINIUM BROMIDE 62.5 MCG/INH IN AEPB
1.0000 | INHALATION_SPRAY | Freq: Every day | RESPIRATORY_TRACT | Status: DC
  Administered 2020-01-16 – 2020-01-19 (×4): 1 via RESPIRATORY_TRACT
  Filled 2020-01-15: qty 7

## 2020-01-15 MED ORDER — NEPRO/CARBSTEADY PO LIQD
237.0000 mL | Freq: Three times a day (TID) | ORAL | Status: DC
  Administered 2020-01-16 – 2020-01-17 (×5): 237 mL via ORAL
  Filled 2020-01-15: qty 237

## 2020-01-15 MED ORDER — MONTELUKAST SODIUM 10 MG PO TABS
10.0000 mg | ORAL_TABLET | Freq: Every day | ORAL | Status: DC
  Administered 2020-01-15 – 2020-01-19 (×5): 10 mg via ORAL
  Filled 2020-01-15 (×5): qty 1

## 2020-01-15 NOTE — Progress Notes (Signed)
Patient trasfered from ED to 5W26 via stretcher; alert and oriented x 4; no complaints of pain; IV saline locked in LAC; skin dry; poor hygiene; excoriation right knee and right hand. Orient patient to room and unit; gave patient care guide; instructed how to use the call bell and  fall risk precautions. Will continue to monitor the patient.

## 2020-01-15 NOTE — H&P (Addendum)
TRH H&P   Patient Demographics:    [name] Herring, is a 59 y.o. male  MRN: [id number]   DOB - 25-Apr-1961  Admit Date - 01/15/2020  Outpatient Primary MD for the patient is Sonia Side., FNP  Referring MD/NP/PA: Dr Ranelle Oyster  Patient coming from: Home  Chief Complaint  Patient presents with  . Fall    Patient has different MRN [id number], please review previous records on the chart   HPI:    [name] Herring  is a 59 y.o. male, with  medical history significant of hypertension, depression, polysubstance abuse (tobacco, alcohol, cocaine), DVT, PE on Eliquis , [name] ,schizophrenia, immunodeficiency disorder, was brought to ED secondary to fall, patient most recent ED visit was in February of this year, secondary to hallucination, with some suicidal ideations, patient was admitted to old [place] psych facility, patient report he stayed there for almost 10 days, that he was discharged home after. -Patient presents to ED secondary to weakness and fall, patient reports overall he has been feeling weak over last week, and he has been having dizziness, lightheadedness over last week, reports is feeling like his legs gave out on him, because he was lightheaded, he fell on the ground, he stated he was too weak to wake up, he denies loss of consciousness, head trauma, neighbor to check on the patient, helped him to get up, and called 911, EMS reports patient was hypotensive systolic blood pressure in the 70s, and his blood sugar was in the 300s, and reports his appetite is good, he was trying to keep up with fluid drinking, but he has been having diarrhea for the last few days, he denies any chest pain, shortness of breath, coffee-ground emesis, nausea or vomiting, he does report dyspnea with activity secondary to generalized weakness. - in ED patient was hypotensive  but he did respond to fluid bolus, Significant mainly for creatinine of 6.69, his baseline is around 1, his BUN was 67, afebrile, his QTC at 544, T head with no acute findings, patient was started on fluid boluses, and [facility] hospital[facility] was called to admit.    Review of systems:    In addition to the HPI above,  No Fever-chills, reports generalized weakness, fatigue, lightheadedness and fall No Headache, No changes with Vision or hearing, No problems swallowing food or Liquids, No Chest pain, Cough or Shortness of Breath, No Abdominal pain, No Nausea or Vommitting, does report some diarrhea. No Blood in stool or Urine, No dysuria, No new skin rashes or bruises, No new joints pains-aches,  No new focal weakness, tingling, numbness in any extremity, he does report generalized weakness No recent weight gain or loss, No polyuria, polydypsia or polyphagia, No significant Mental Stressors.  Denies any suicidal thoughts or ideations  A full 10 point Review of Systems was done, except as stated above, all other  Review of Systems were negative.   With Past History of the following :    Depression   . HIV (human immunodeficiency virus infection) ([place])   . Hypertension   . Immune deficiency disorder ([place])   . PE (pulmonary thromboembolism) ([place]) 2018  . Personality disorder   . Schizophrenia ([place])       Social History:     Reports he is still smoking cigarettes, never used smokeless tobacco, he drinks alcohol occasionally, ports no recent drug use.  Lives -at home by himself     Family History :    CAD Mother   . Kidney disease Sister   . Lupus Brother   . Cancer Maternal Grandmother   . Stroke Paternal Grandmother        Home Medications:   Prior to Admission medications   Medication Sig Start Date End Date Taking? Authorizing Provider  benztropine (COGENTIN) 0.5 MG tablet Take 0.5 mg by mouth 2 (two) times daily. 12/27/19  Yes [provider]   BIKTARVY 50-200-25 MG TABS tablet Take 1 tablet by mouth daily. 01/01/20  Yes [provider]  ELIQUIS 5 MG TABS tablet Take 5 mg by mouth 2 (two) times daily. 01/01/20  Yes [provider]  fluticasone (FLONASE) 50 MCG/ACT nasal spray Place 1-2 sprays into both nostrils daily as needed for rhinitis. 11/01/19  Yes [provider]  gabapentin (NEURONTIN) 800 MG tablet Take 800 mg by mouth at bedtime. 10/03/19  Yes [provider]  hydrOXYzine (ATARAX/VISTARIL) 50 MG tablet Take 50-100 mg by mouth at bedtime as needed for anxiety. 01/05/20  Yes [provider]  INCRUSE ELLIPTA 62.5 MCG/INH AEPB Inhale 1 puff into the lungs daily. 01/01/20  Yes [provider]  INGREZZA 80 MG CAPS Take 1 capsule by mouth daily. 12/27/19  Yes [provider]  lisinopril (ZESTRIL) 40 MG tablet Take 40 mg by mouth daily. 01/01/20  Yes [provider]  Melatonin 3-10 MG TABS Take 1 tablet by mouth at bedtime. 11/27/19  Yes [provider]  mirtazapine (REMERON) 30 MG tablet Take 30 mg by mouth at bedtime. 12/27/19  Yes [provider]  montelukast (SINGULAIR) 10 MG tablet Take 10 mg by mouth daily. 10/02/19  Yes [provider]  naltrexone (DEPADE) 50 MG tablet Take 50 mg by mouth daily. 12/27/19  Yes [provider]  pantoprazole (PROTONIX) 40 MG tablet Take 40 mg by mouth daily. 01/01/20  Yes [provider]  QUEtiapine (SEROQUEL) 400 MG tablet Take 400 mg by mouth at bedtime. 12/27/19  Yes [provider]  RISPERDAL 1 MG tablet Take 1-2 mg by mouth See admin instructions. Take 1mg  in the morning, and 2mg  at bedtime 01/13/20  Yes [provider]  SPIRIVA HANDIHALER 18 MCG inhalation capsule Place 1 capsule into inhaler and inhale daily. 01/01/20  Yes [provider]  SYMBICORT 160-4.5 MCG/ACT inhaler Inhale 2 puffs into the lungs in the morning and at bedtime. 10/03/19  Yes [provider]   tamsulosin (FLOMAX) 0.4 MG CAPS capsule Take 0.4 mg by mouth every evening. 01/01/20  Yes [provider]     Allergies:     Allergies  Allergen Reactions  . Amoxicillin Rash     Physical Exam:   Vitals  Blood pressure 99/74, pulse 75, temperature 97.7 F (36.5 C), temperature source Oral, resp. rate (!) 23, height 5\' 9"  (1.753 m), weight 63.5 kg, SpO2 99 %.   1. General frail, cachectic male, laying  in bed in mild discomfort .  2. Normal affect and insight, Not Suicidal or Homicidal, Awake Alert, Oriented X 3.  3. No F.N deficits, ALL C.Nerves Intact, Strength 5/5 all 4 extremities, Sensation intact all 4 extremities, Plantars down going.  4. Ears and Eyes appear Normal, Conjunctivae clear, PERRLA. Moist Oral Mucosa.  5. Supple Neck, No JVD, No cervical lymphadenopathy appriciated, No Carotid Bruits.  6. Symmetrical Chest wall movement, Good air movement bilaterally, CTAB.  7. RRR, No Gallops, Rubs or Murmurs, No Parasternal Heave.  8. Positive Bowel Sounds, Abdomen Soft, No tenderness, No organomegaly appriciated,No rebound -guarding or rigidity.  9.  No Cyanosis, Normal Skin Turgor, No Skin Rash or Bruise.  10.  Significant muscle wasting ,  joints appear normal , no effusions, Normal ROM.  11. No Palpable Lymph Nodes in Neck or Axillae     Data Review:    CBC Recent Labs  Lab 01/15/20 1357  WBC 8.0  HGB 14.6  HCT 45.6  PLT PLATELET CLUMPS NOTED ON SMEAR, COUNT APPEARS DECREASED  MCV 107.0*  MCH 34.3*  MCHC 32.0  RDW 13.1  LYMPHSABS 0.9  MONOABS 0.8  EOSABS 0.0  BASOSABS 0.0   ------------------------------------------------------------------------------------------------------------------  Chemistries  Recent Labs  Lab 01/15/20 1357  NA 147*  K 3.8  CL 113*  CO2 21*  GLUCOSE 128*  BUN 67*  CREATININE 6.69*  CALCIUM 8.7*    ------------------------------------------------------------------------------------------------------------------ estimated creatinine clearance is 10.8 mL/min (A) (by C-G formula based on SCr of 6.69 mg/dL (H)). ------------------------------------------------------------------------------------------------------------------ No results for input(s): TSH, T4TOTAL, T3FREE, THYROIDAB in the last 72 hours.  Invalid input(s): FREET3  Coagulation profile No results for input(s): INR, PROTIME in the last 168 hours. ------------------------------------------------------------------------------------------------------------------- No results for input(s): DDIMER in the last 72 hours. -------------------------------------------------------------------------------------------------------------------  Cardiac Enzymes No results for input(s): CKMB, TROPONINI, MYOGLOBIN in the last 168 hours.  Invalid input(s): CK ------------------------------------------------------------------------------------------------------------------ No results found for: BNP   ---------------------------------------------------------------------------------------------------------------  Urinalysis    Component Value Date/Time   COLORURINE AMBER (A) 01/15/2020 1638   APPEARANCEUR HAZY (A) 01/15/2020 1638   LABSPEC 1.027 01/15/2020 1638   PHURINE 5.0 01/15/2020 1638   GLUCOSEU NEGATIVE 01/15/2020 1638   HGBUR NEGATIVE 01/15/2020 1638   [place] 01/15/2020 1638   [place] 01/15/2020 1638   PROTEINUR 30 (A) 01/15/2020 1638   NITRITE NEGATIVE 01/15/2020 1638   LEUKOCYTESUR NEGATIVE 01/15/2020 1638    ----------------------------------------------------------------------------------------------------------------   Imaging Results:    CT Head Wo Contrast  Result Date: 01/15/2020 CLINICAL DATA:  Head trauma. Fell at home following a dizzy/syncopal episode last night. Found on the floor  this morning. EXAM: CT HEAD WITHOUT CONTRAST CT CERVICAL SPINE WITHOUT CONTRAST TECHNIQUE: Multidetector CT imaging of the head and cervical spine was performed following the standard protocol without intravenous contrast. Multiplanar CT image reconstructions of the cervical spine were also generated. COMPARISON:  10/29/2017. FINDINGS: CT HEAD FINDINGS Brain: No evidence of acute infarction, hemorrhage, hydrocephalus, extra-axial collection or mass lesion/mass effect. There is ventricular and sulcal enlargement reflecting atrophy, advanced for age. Mild periventricular white matter hypoattenuation is noted consistent with chronic microvascular ischemic change. These findings are stable from the prior head CT. Vascular: No hyperdense vessel or unexpected calcification. Skull: Normal. Negative for fracture or focal lesion. Sinuses/Orbits: Globes and orbits are unremarkable. Mucous retention cyst in the left maxillary sinus. Sinuses otherwise clear. Other: None. CT CERVICAL SPINE FINDINGS Alignment: Normal. Skull base and vertebrae: No acute fracture. No primary bone lesion or focal pathologic process. Soft  tissues and spinal canal: No prevertebral fluid or swelling. No visible canal hematoma. Disc levels: Moderate loss of disc height at C4-C5. Mild loss of disc height at C5-C6. Mild to moderate loss of disc height at C6-C7. Mild spondylotic disc bulging with endplate spurring noted at these levels. No convincing disc herniation. Upper chest: No acute findings. Significant centrilobular emphysema noted at the lung apices. Other: None. IMPRESSION: HEAD CT 1. No acute intracranial abnormalities. 2. Atrophy advanced for age. Mild chronic microvascular ischemic change. Stable appearance from the prior head CT. CERVICAL CT 1. No fracture or acute finding. Electronically Signed   By: Lajean Manes M.D.   On: 01/15/2020 15:31   CT Cervical Spine Wo Contrast  Result Date: 01/15/2020 CLINICAL DATA:  Head trauma. Fell at  home following a dizzy/syncopal episode last night. Found on the floor this morning. EXAM: CT HEAD WITHOUT CONTRAST CT CERVICAL SPINE WITHOUT CONTRAST TECHNIQUE: Multidetector CT imaging of the head and cervical spine was performed following the standard protocol without intravenous contrast. Multiplanar CT image reconstructions of the cervical spine were also generated. COMPARISON:  10/29/2017. FINDINGS: CT HEAD FINDINGS Brain: No evidence of acute infarction, hemorrhage, hydrocephalus, extra-axial collection or mass lesion/mass effect. There is ventricular and sulcal enlargement reflecting atrophy, advanced for age. Mild periventricular white matter hypoattenuation is noted consistent with chronic microvascular ischemic change. These findings are stable from the prior head CT. Vascular: No hyperdense vessel or unexpected calcification. Skull: Normal. Negative for fracture or focal lesion. Sinuses/Orbits: Globes and orbits are unremarkable. Mucous retention cyst in the left maxillary sinus. Sinuses otherwise clear. Other: None. CT CERVICAL SPINE FINDINGS Alignment: Normal. Skull base and vertebrae: No acute fracture. No primary bone lesion or focal pathologic process. Soft tissues and spinal canal: No prevertebral fluid or swelling. No visible canal hematoma. Disc levels: Moderate loss of disc height at C4-C5. Mild loss of disc height at C5-C6. Mild to moderate loss of disc height at C6-C7. Mild spondylotic disc bulging with endplate spurring noted at these levels. No convincing disc herniation. Upper chest: No acute findings. Significant centrilobular emphysema noted at the lung apices. Other: None. IMPRESSION: HEAD CT 1. No acute intracranial abnormalities. 2. Atrophy advanced for age. Mild chronic microvascular ischemic change. Stable appearance from the prior head CT. CERVICAL CT 1. No fracture or acute finding. Electronically Signed   By: Lajean Manes M.D.   On: 01/15/2020 15:31   DG Chest Portable 1  View  Result Date: 01/15/2020 CLINICAL DATA:  Trauma secondary to a fall yesterday. EXAM: PORTABLE CHEST 1 VIEW COMPARISON:  11/11/2019 FINDINGS: The heart size and pulmonary vascularity are normal. There is slight haziness at the right lung base which could represent a small infiltrate or posterior fusion. A lateral view of the chest may be helpful. No pneumothorax. No acute bone abnormality. IMPRESSION: Possible small infiltrate or posterior effusion at the right lung base. Lateral view of the chest may be helpful for further evaluation. Electronically Signed   By: Lorriane Shire M.D.   On: 01/15/2020 14:37    My personal review of EKG: Rhythm NSR,QTc 544 , no Acute ST changes   Assessment & Plan:    Active Problems:   HIV (human immunodeficiency virus infection) ([place])   AKI (acute kidney injury) ([place])   History of pulmonary embolism  Acute kidney injury -There is very likely some degree of ATN, from hypotension and low blood pressure, this is mainly caused by prerenal and volume depletion, especially he is on lisinopril 40  mg oral daily. -No evidence of urine retention on physical exam. -will check UA, urine sodium, urine creatinine, and calculate FENa. -Continue with IV fluids at 100 cc/h, he received 2 L bolus in ED. -We will check renal ultrasound. -Total CK within normal limit -Check bladder scan every 8 hours to ensure there is no urinary retention. -Avoid nephrotoxic medications,, she has been counseled to adjust medications due to his renal function.  HIV -Patient report he has been compliant with his medications, but given his renal function, will hold BIKTARVY, and this can be resumed once his creatinine clearance>30.  Hypertension -Blood pressure low on presentation responded to fluid bolus, lisinopril will be held.  History of PE and DVT -He is on Eliquis, I will hold for the next 24 hours  given his creatinine of 6.7, he can be resumed on heparin gtt. after  that.  Severe Protein calorie malnutrition -We will start on Nepro  Prolonged QTC -QTC is 544, will hold prolonging agents, will monitor on telemetry, repeat EKG in a.m.  Schizophrenia and depression -He denies any suicidal thoughts or ideations currently -We will hold his Haldol and Seroquel given his prolonged QTC.    DVT Prophylaxis Eliquis on hold  AM Labs Ordered, also please review Full Orders  Family Communication: Admission, patients condition and plan of care including tests being ordered have been discussed with the patient  And daughter by phone who indicate understanding and agree with the plan and Code Status.  Code Status Full  Likely DC to pending PT evaluation  Condition GUARDED    Consults called: None  Admission status: inpatient  Time spent in minutes : 60 minutes   Phillips Climes M.D on 01/15/2020 at 5:12 PM   [facility] - Office  [phone]

## 2020-01-15 NOTE — ED Triage Notes (Addendum)
Pt BIB [facility] from home, fell at home following dizziness/syncopal episode last night, on thinners, found on floor this am. BP 70's/palp, been having diarrhea.316 CBG per EMS. Pt A&Ox4. C-collar in place. NAD noted.

## 2020-01-15 NOTE — Progress Notes (Signed)
Orthopedic Tech Progress Note Patient Details:  Flavia Shipper Doe 09/25/1875 [id number] Level 2 trauma Patient ID: Flavia Shipper Doe, male   DOB: 09/25/1875, 59 y.o.   MRN: [id number]   Janit Pagan 01/15/2020, 1:58 PM

## 2020-01-15 NOTE — ED Notes (Signed)
Pt transported to CT ?

## 2020-01-15 NOTE — ED Provider Notes (Signed)
[facility] EMERGENCY DEPARTMENT Provider Note   CSN: [id number] Arrival date & time: 01/15/20  1347     History Chief Complaint  Patient presents with   Lytle Michaels    [name] Herring is a 59 y.o. male past medical history of schizophrenia, HIV, DVT, PE on eliquis presenting to the ED with weakness and fall.  The patient has been seen in the ED several times for behavioral health issues in the Ed the past few months.  He presents with an episode of weakness and a fall yesterday.  He reports to me that he feels like his legs just gave out on him.  He also became very lightheaded prior to that.  He fell to the ground.  He said his methadone on the ground was too weak to get up.  A neighbor checked on him today and helped him get up and then called 911.  EMS reports that the patient was hypotensive on their arrival with a blood pressure in the [id number] systolic.  He also did blood sugar in the 300s.  The patient had reportedly been having 3 days of diarrhea which she reported to me as well.  Here in the ED he feels back to baseline.  He tells me "I drink water all the time."  He reports he has been taking him home medications.  He lives by himself.   He is NOT on dialysis and urinates regularly daily.  He denies being told he has chronic kidney disease.  HPI     No past medical history on file.  Patient Active Problem List   Diagnosis Date Noted   HIV (human immunodeficiency virus infection) ([place]) 01/15/2020   AKI (acute kidney injury) ([place]) 01/15/2020   History of pulmonary embolism 01/15/2020    No family history on file.  Social History   Tobacco Use   Smoking status: Not on file  Substance Use Topics   Alcohol use: Not on file   Drug use: Not on file    Home Medications Prior to Admission medications   Medication Sig Start Date End Date Taking? Authorizing Provider  benztropine (COGENTIN) 0.5 MG tablet Take 0.5 mg by mouth 2 (two) times daily. 12/27/19  Yes  [provider]  BIKTARVY 50-200-25 MG TABS tablet Take 1 tablet by mouth daily. 01/01/20  Yes [provider]  ELIQUIS 5 MG TABS tablet Take 5 mg by mouth 2 (two) times daily. 01/01/20  Yes [provider]  fluticasone (FLONASE) 50 MCG/ACT nasal spray Place 1-2 sprays into both nostrils daily as needed for rhinitis. 11/01/19  Yes [provider]  gabapentin (NEURONTIN) 800 MG tablet Take 800 mg by mouth at bedtime. 10/03/19  Yes [provider]  hydrOXYzine (ATARAX/VISTARIL) 50 MG tablet Take 50-100 mg by mouth at bedtime as needed for anxiety. 01/05/20  Yes [provider]  INCRUSE ELLIPTA 62.5 MCG/INH AEPB Inhale 1 puff into the lungs daily. 01/01/20  Yes [provider]  INGREZZA 80 MG CAPS Take 1 capsule by mouth daily. 12/27/19  Yes [provider]  lisinopril (ZESTRIL) 40 MG tablet Take 40 mg by mouth daily. 01/01/20  Yes [provider]  Melatonin 3-10 MG TABS Take 1 tablet by mouth at bedtime. 11/27/19  Yes [provider]  mirtazapine (REMERON) 30 MG tablet Take 30 mg by mouth at bedtime. 12/27/19  Yes [provider]  montelukast (SINGULAIR) 10 MG tablet Take 10 mg by mouth daily. 10/02/19  Yes [provider]  naltrexone (DEPADE) 50 MG tablet Take 50 mg by mouth daily. 12/27/19  Yes [provider]  pantoprazole (PROTONIX) 40 MG tablet Take 40 mg by mouth daily. 01/01/20  Yes [provider]  QUEtiapine (SEROQUEL) 400 MG tablet Take 400 mg by mouth at bedtime. 12/27/19  Yes [provider]  RISPERDAL 1 MG tablet Take 1-2 mg by mouth See admin instructions. Take 1mg  in the morning, and 2mg  at bedtime 01/13/20  Yes [provider]  SPIRIVA HANDIHALER 18 MCG inhalation capsule Place 1 capsule into inhaler and inhale daily. 01/01/20  Yes [provider]  SYMBICORT 160-4.5 MCG/ACT inhaler Inhale 2 puffs into the lungs in the morning and at bedtime. 10/03/19  Yes [provider]  tamsulosin (FLOMAX) 0.4 MG CAPS capsule Take 0.4 mg by mouth every evening. 01/01/20  Yes [provider]    Allergies    Amoxicillin  Review of Systems   Review of Systems  Constitutional: Negative for chills and fever.  HENT: Negative for ear pain and sore throat.   Eyes: Negative for pain and visual disturbance.  Respiratory: Negative for cough and shortness of breath.   Cardiovascular: Negative for chest pain and palpitations.  Gastrointestinal: Negative for abdominal pain and vomiting.  Genitourinary: Negative for dysuria and hematuria.  Musculoskeletal: Positive for arthralgias and neck pain.  Skin: Negative for color change and rash.  Neurological: Positive for syncope and light-headedness.  All other systems reviewed and are negative.   Physical Exam Updated Vital Signs BP 96/71    Pulse 77    Temp 97.7 F (36.5 C) (Oral)    Resp (!) 21    Ht 5\' 9"  (1.753 m)    Wt 63.5 kg    SpO2 99%    BMI 20.67 kg/m   Physical Exam Vitals and nursing note reviewed.  Constitutional:      Appearance: He is well-developed.     Comments: Thin  HENT:     Head: Normocephalic and atraumatic.  Eyes:     Conjunctiva/sclera: Conjunctivae normal.  Neck:     Comments: C spine collar in place Cardiovascular:     Rate and Rhythm: Normal rate and regular rhythm.     Pulses: Normal pulses.  Pulmonary:     Effort: Pulmonary effort is normal. No respiratory distress.     Breath sounds: Normal breath sounds.  Abdominal:     Palpations: Abdomen is soft.     Tenderness: There is no abdominal tenderness.  Skin:    General: Skin is warm and dry.  Neurological:     General: No focal deficit present.     Mental Status: He is alert and oriented to person, place, and time.  Psychiatric:        Mood and Affect: Mood normal.        Behavior: Behavior normal.     ED Results / Procedures / Treatments   Labs (all labs ordered are listed, but only abnormal results are  displayed) Labs Reviewed  BASIC METABOLIC PANEL - Abnormal; Notable for the following components:      Result Value   Sodium 147 (*)    Chloride 113 (*)    CO2 21 (*)    Glucose, Bld 128 (*)    BUN 67 (*)    Creatinine, Ser 6.69 (*)    Calcium 8.7 (*)    GFR calc non Af Amer 8 (*)    GFR calc Af Amer 10 (*)  All other components within normal limits  CBC WITH DIFFERENTIAL/PLATELET - Abnormal; Notable for the following components:   MCV 107.0 (*)    MCH 34.3 (*)    All other components within normal limits  SARS CORONAVIRUS 2 (TAT 6-24 HRS)  CK  URINALYSIS, ROUTINE W REFLEX MICROSCOPIC  NA AND K (SODIUM & POTASSIUM), RAND UR  CREATININE, URINE, RANDOM  TROPONIN I (HIGH SENSITIVITY)  TROPONIN I (HIGH SENSITIVITY)    EKG EKG Interpretation  Date/Time:  Thursday January 15 2020 14:08:25 EDT Ventricular Rate:  85 PR Interval:    QRS Duration: 93 QT Interval:  457 QTC Calculation: 544 R Axis:   61 Text Interpretation: Sinus rhythm Prolonged QT interval No STEMI Confirmed by Octaviano Glow [phone]) on 01/15/2020 2:10:29 PM   Radiology CT Head Wo Contrast  Result Date: 01/15/2020 CLINICAL DATA:  Head trauma. Fell at home following a dizzy/syncopal episode last night. Found on the floor this morning. EXAM: CT HEAD WITHOUT CONTRAST CT CERVICAL SPINE WITHOUT CONTRAST TECHNIQUE: Multidetector CT imaging of the head and cervical spine was performed following the standard protocol without intravenous contrast. Multiplanar CT image reconstructions of the cervical spine were also generated. COMPARISON:  10/29/2017. FINDINGS: CT HEAD FINDINGS Brain: No evidence of acute infarction, hemorrhage, hydrocephalus, extra-axial collection or mass lesion/mass effect. There is ventricular and sulcal enlargement reflecting atrophy, advanced for age. Mild periventricular white matter hypoattenuation is noted consistent with chronic microvascular ischemic change. These findings are stable from the prior  head CT. Vascular: No hyperdense vessel or unexpected calcification. Skull: Normal. Negative for fracture or focal lesion. Sinuses/Orbits: Globes and orbits are unremarkable. Mucous retention cyst in the left maxillary sinus. Sinuses otherwise clear. Other: None. CT CERVICAL SPINE FINDINGS Alignment: Normal. Skull base and vertebrae: No acute fracture. No primary bone lesion or focal pathologic process. Soft tissues and spinal canal: No prevertebral fluid or swelling. No visible canal hematoma. Disc levels: Moderate loss of disc height at C4-C5. Mild loss of disc height at C5-C6. Mild to moderate loss of disc height at C6-C7. Mild spondylotic disc bulging with endplate spurring noted at these levels. No convincing disc herniation. Upper chest: No acute findings. Significant centrilobular emphysema noted at the lung apices. Other: None. IMPRESSION: HEAD CT 1. No acute intracranial abnormalities. 2. Atrophy advanced for age. Mild chronic microvascular ischemic change. Stable appearance from the prior head CT. CERVICAL CT 1. No fracture or acute finding. Electronically Signed   By: Lajean Manes M.D.   On: 01/15/2020 15:31   CT Cervical Spine Wo Contrast  Result Date: 01/15/2020 CLINICAL DATA:  Head trauma. Fell at home following a dizzy/syncopal episode last night. Found on the floor this morning. EXAM: CT HEAD WITHOUT CONTRAST CT CERVICAL SPINE WITHOUT CONTRAST TECHNIQUE: Multidetector CT imaging of the head and cervical spine was performed following the standard protocol without intravenous contrast. Multiplanar CT image reconstructions of the cervical spine were also generated. COMPARISON:  10/29/2017. FINDINGS: CT HEAD FINDINGS Brain: No evidence of acute infarction, hemorrhage, hydrocephalus, extra-axial collection or mass lesion/mass effect. There is ventricular and sulcal enlargement reflecting atrophy, advanced for age. Mild periventricular white matter hypoattenuation is noted consistent with chronic  microvascular ischemic change. These findings are stable from the prior head CT. Vascular: No hyperdense vessel or unexpected calcification. Skull: Normal. Negative for fracture or focal lesion. Sinuses/Orbits: Globes and orbits are unremarkable. Mucous retention cyst in the left maxillary sinus. Sinuses otherwise clear. Other: None. CT CERVICAL SPINE FINDINGS Alignment: Normal. Skull base and vertebrae: No  acute fracture. No primary bone lesion or focal pathologic process. Soft tissues and spinal canal: No prevertebral fluid or swelling. No visible canal hematoma. Disc levels: Moderate loss of disc height at C4-C5. Mild loss of disc height at C5-C6. Mild to moderate loss of disc height at C6-C7. Mild spondylotic disc bulging with endplate spurring noted at these levels. No convincing disc herniation. Upper chest: No acute findings. Significant centrilobular emphysema noted at the lung apices. Other: None. IMPRESSION: HEAD CT 1. No acute intracranial abnormalities. 2. Atrophy advanced for age. Mild chronic microvascular ischemic change. Stable appearance from the prior head CT. CERVICAL CT 1. No fracture or acute finding. Electronically Signed   By: Lajean Manes M.D.   On: 01/15/2020 15:31   DG Chest Portable 1 View  Result Date: 01/15/2020 CLINICAL DATA:  Trauma secondary to a fall yesterday. EXAM: PORTABLE CHEST 1 VIEW COMPARISON:  11/11/2019 FINDINGS: The heart size and pulmonary vascularity are normal. There is slight haziness at the right lung base which could represent a small infiltrate or posterior fusion. A lateral view of the chest may be helpful. No pneumothorax. No acute bone abnormality. IMPRESSION: Possible small infiltrate or posterior effusion at the right lung base. Lateral view of the chest may be helpful for further evaluation. Electronically Signed   By: Lorriane Shire M.D.   On: 01/15/2020 14:37    Procedures Procedures (including critical care time)  Medications Ordered in  ED Medications  sodium chloride 0.9 % bolus 1,000 mL (0 mLs Intravenous Stopped 01/15/20 1556)  sodium chloride 0.9 % bolus 1,000 mL (1,000 mLs Intravenous New Bag/Given 01/15/20 1637)    ED Course  I have reviewed the triage vital signs and the nursing notes.  Pertinent labs & imaging results that were available during my care of the patient were reviewed by me and considered in my medical decision making (see chart for details).  59 yo male presenting to the Ed with weakness, hypotension, and near-syncope yesterday evening.  Spent several hours lying on the ground last night.  Here he appears thin, in no acute distress.  Near syncope has a broad differential including dehydration vs ACS vs arrhythmia vs stroke vs. Hypoglycemia vs other  ECG with NSR on arrival BP low but stable.  He is on lisinopril 40 mg daily and metoprolol 25 mg daily.  Appears somewhat confused about his medical regimen  Level 2 due to fall on blood thinner Will CTH and C-spine (he reports neck pain) Check BMP, CBC, UA, CK level Give IVF, monitor BP here  No evidence of sepsis on initial presentation or per history  Trop 11 and CK 334, not indicative of ACS, arrhythmia, or rhabd Hgb 14.6, doubtful of acute bleeding  Chart was reviewed including last several ED visits, [facility] evaluation, appears he had been placed at [facility] in Feb 2021.  He must have been discharged home as EMS reported he came from home today.  Clinical Course as of Jan 15 1643  Thu Jan 15, 2020  1607 Chart merged with Rennis Golden, last Cr was between 1-2 during Feb Ed visit, this appears to be an [place]. No evidence of rhabdo with CK of only 334 currently   [MT]  1637 Signed out to hospitalist, plan admission for AKI, hypotension.  No evidence of septic or cardiogenic shock at this time.  I suspect this may be hypovolemic, with a prerenal AKI.  I do believe he is stable for a telemetry bed and not requiring vasopressors  at this time.   [MT]     Clinical Course User Index [MT] Yoav Okane, Carola Rhine, MD    Final Clinical Impression(s) / ED Diagnoses Final diagnoses:  Fall, initial encounter  Dehydration  AKI (acute kidney injury) ([place])  Near syncope    Rx / DC Orders ED Discharge Orders    None       Viveca Beckstrom, Carola Rhine, MD 01/15/20 1644

## 2020-01-16 DIAGNOSIS — D696 Thrombocytopenia, unspecified: Secondary | ICD-10-CM

## 2020-01-16 LAB — BASIC METABOLIC PANEL
Anion gap: 6 (ref 5–15)
BUN: 53 mg/dL — ABNORMAL HIGH (ref 6–20)
CO2: 22 mmol/L (ref 22–32)
Calcium: 8.5 mg/dL — ABNORMAL LOW (ref 8.9–10.3)
Chloride: 116 mmol/L — ABNORMAL HIGH (ref 98–111)
Creatinine, Ser: 2.49 mg/dL — ABNORMAL HIGH (ref 0.61–1.24)
GFR calc Af Amer: 32 mL/min — ABNORMAL LOW (ref >60)
GFR calc non Af Amer: 27 mL/min — ABNORMAL LOW (ref >60)
Glucose, Bld: 97 mg/dL (ref 70–99)
Potassium: 3.8 mmol/L (ref 3.5–5.1)
Sodium: 144 mmol/L (ref 135–145)

## 2020-01-16 LAB — T4, FREE: Free T4: 0.72 ng/dL (ref 0.61–1.12)

## 2020-01-16 LAB — CBC
HCT: 40.5 % (ref 39.0–52.0)
Hemoglobin: 13.4 g/dL (ref 13.0–17.0)
MCH: 34.4 pg — ABNORMAL HIGH (ref 26.0–34.0)
MCHC: 33.1 g/dL (ref 30.0–36.0)
MCV: 104.1 fL — ABNORMAL HIGH (ref 80.0–100.0)
Platelets: 71 10*3/uL — ABNORMAL LOW (ref 150–400)
RBC: 3.89 MIL/uL — ABNORMAL LOW (ref 4.22–5.81)
RDW: 13.1 % (ref 11.5–15.5)
WBC: 5 10*3/uL (ref 4.0–10.5)
nRBC: 0 % (ref 0.0–0.2)

## 2020-01-16 LAB — CORTISOL: Cortisol, Plasma: 16.5 ug/dL

## 2020-01-16 LAB — TSH: TSH: 0.629 u[IU]/mL (ref 0.350–4.500)

## 2020-01-16 MED ORDER — ENOXAPARIN SODIUM 30 MG/0.3ML ~~LOC~~ SOLN
30.0000 mg | SUBCUTANEOUS | Status: DC
  Administered 2020-01-16: 30 mg via SUBCUTANEOUS
  Filled 2020-01-16: qty 0.3

## 2020-01-16 MED ORDER — BICTEGRAVIR-EMTRICITAB-TENOFOV 50-200-25 MG PO TABS
1.0000 | ORAL_TABLET | Freq: Every day | ORAL | Status: DC
  Administered 2020-01-16 – 2020-01-19 (×4): 1 via ORAL
  Filled 2020-01-16 (×4): qty 1

## 2020-01-16 NOTE — NC FL2 (Signed)
[place] LEVEL OF CARE SCREENING TOOL     IDENTIFICATION  Patient Name: [name] Herring Birthdate: 04/26/1961 Sex: male Admission Date (Current Location): 01/15/2020  [facility] and [place] Number:  Herbalist and Address:  [facility] [place], [place] [address] [place], [place] [address]      Provider Number: [id number]  Attending Physician Name and Address:  Albertine Patricia, MD  Relative Name and Phone Number:  Holland Commons, daughter, [phone]    Current Level of Care: Hospital Recommended Level of Care: [place] Prior Approval Number:    Date Approved/Denied:   PASRR Number: pending  Discharge Plan: SNF    Current Diagnoses: Patient Active Problem List   Diagnosis Date Noted  . HIV (human immunodeficiency virus infection) ([place]) 01/15/2020  . AKI (acute kidney injury) ([place]) 01/15/2020  . History of pulmonary embolism 01/15/2020    Orientation RESPIRATION BLADDER Height & Weight     Self, Time, Situation, Place  Normal Continent Weight: 140 lb (63.5 kg) Height:  5\' 9"  (175.3 cm)  BEHAVIORAL SYMPTOMS/MOOD NEUROLOGICAL BOWEL NUTRITION STATUS      Continent Diet(Please see DC Summary)  AMBULATORY STATUS COMMUNICATION OF NEEDS Skin   Limited Assist Verbally Normal                       Personal Care Assistance Level of Assistance  Bathing, Feeding, Dressing Bathing Assistance: Limited assistance Feeding assistance: Independent Dressing Assistance: Limited assistance     Functional Limitations Info  Sight, Hearing, Speech Sight Info: Adequate Hearing Info: Adequate Speech Info: Adequate    SPECIAL CARE FACTORS FREQUENCY  PT (By licensed PT), OT (By licensed OT)     PT Frequency: 5x OT Frequency: 5x            Contractures Contractures Info: Not present    Additional Factors Info  Code Status, Allergies Code Status Info: Full Allergies Info: Amoxicillin           Current  Medications (01/16/2020):  This is the current hospital active medication list Current Facility-Administered Medications  Medication Dose Route Frequency Provider Last Rate Last Admin  . 0.9 %  sodium chloride infusion   Intravenous Continuous Elgergawy, Silver Huguenin, MD 100 mL/hr at 01/16/20 0921 New Bag at 01/16/20 0921  . acetaminophen (TYLENOL) tablet 650 mg  650 mg Oral Q6H PRN Elgergawy, Silver Huguenin, MD       Or  . acetaminophen (TYLENOL) suppository 650 mg  650 mg Rectal Q6H PRN Elgergawy, Silver Huguenin, MD      . feeding supplement (NEPRO CARB STEADY) liquid 237 mL  237 mL Oral TID WC Elgergawy, Silver Huguenin, MD   237 mL at 01/16/20 0918  . melatonin tablet 3-9 mg  3-9 mg Oral QHS Elgergawy, Silver Huguenin, MD   9 mg at 01/15/20 2138  . mometasone-formoterol (DULERA) 200-5 MCG/ACT inhaler 2 puff  2 puff Inhalation BID Elgergawy, Silver Huguenin, MD   2 puff at 01/16/20 0831  . montelukast (SINGULAIR) tablet 10 mg  10 mg Oral Daily Elgergawy, Silver Huguenin, MD   10 mg at 01/16/20 0830  . pantoprazole (PROTONIX) EC tablet 40 mg  40 mg Oral Daily Elgergawy, Silver Huguenin, MD   40 mg at 01/16/20 0830  . tamsulosin (FLOMAX) capsule 0.4 mg  0.4 mg Oral QPM Elgergawy, Silver Huguenin, MD   0.4 mg at 01/15/20 2029  . umeclidinium bromide (INCRUSE ELLIPTA) 62.5 MCG/INH 1 puff  1  puff Inhalation Daily Elgergawy, Silver Huguenin, MD   1 puff at 01/16/20 [id number]     Discharge Medications: Please see discharge summary for a list of discharge medications.  Relevant Imaging Results:  Relevant Lab Results:   Additional Information SSN: [id number]      COVID negative 01/15/20  Benard Halsted, LCSW

## 2020-01-16 NOTE — Progress Notes (Signed)
PROGRESS NOTE                                                                                                                                                                                                             Patient Demographics:    [name] Herring, is a 59 y.o. male, DOB - 09/13/1961, [id number]  Admit date - 01/15/2020   Admitting Physician Albertine Patricia, MD  Outpatient Primary MD for the patient is Sonia Side., FNP  LOS - 1   Chief Complaint  Patient presents with  . Fall       Brief Narrative   [name] Herring is a 59 y.o. male, with  medical history significant ofhypertension, depression, polysubstance abuse (tobacco, alcohol, cocaine) in the past , DVT, PE on Eliquis , HIV following [name] ,schizophrenia, immunodeficiency disorder, was brought to ED secondary to fall,  and near syncope, he was noted by EMS to be low systolic blood pressure in the 70s, same thing in ED, but he did respond to fluid bolus, noted to have increased creatinine of 6.7, no evidence of rhabdo muscle he was admitted for further work-up .   Subjective:    [name] Herring today reports he is feeling better, appetite has improved, reports generalized weakness, but no dizziness, lightheadedness, no chest pain .   Assessment  & Plan :    Active Problems:   HIV (human immunodeficiency virus infection) ([place])   AKI (acute kidney injury) ([place])   History of pulmonary embolism   Acute kidney injury -Most likely ATN, from hypotension and low blood pressure, in the setting of using lisinopril, and volume depletion/dehydration. -Continue to hold lisinopril. -No evidence of urine retention on physical exam. -Creatnine  was 6.7 on admission, improving with IV fluids, this morning at 2.4. -Renal ultrasound with no evidence of hydronephrosis, but some evidence of chronic kidney disease. -Total CK within normal limit -Avoid nephrotoxic  medications.  HIV -Patient report he has been compliant with his medications,  BIKTARVY has been held on admission given creatinine clearance less than 30, now GFR is 32, so it will be resumed today.  Hypertension/hypotension -Patient with significant hypotension on presentation, this appears to be a clinical dehydration, cortisol and TSH within normal limit. -Blood pressure has improved continue with IV fluids  History of PE  and DVT -He is on Eliquis, he has history of IVC filter, Eliquis initially held given acute renal failure, this morning he has thrombocytopenia with platelet count of 71, I will keep on DVT prophylaxis dose Lovenox 80 mg subcutaneous daily for next 24 hours, and resume Eliquis tomorrow if platelet count continues to improve .  Thrombocytopenia -Likely due to AKI, will continue to monitor closely  Severe Protein calorie malnutrition - on Nepro  Prolonged QTC -QTC is 544 on admission, will hold prolonging agents, will monitor on telemetry, repeat EKG in a.m.  Schizophrenia and depression -He denies any suicidal thoughts or ideations currently -We will hold his Haldol and Seroquel given his prolonged QTC.     COVID-19 Labs  No results for input(s): DDIMER, FERRITIN, LDH, CRP in the last 72 hours.  Lab Results  Component Value Date   [place] NEGATIVE 01/15/2020     Code Status : Full  Family Communication  : D/W daughter on 4/22  Disposition Plan  :  Status is: Inpatient  Remains inpatient appropriate because:Hemodynamically unstable, Ongoing diagnostic testing needed not appropriate for outpatient work up and IV treatments appropriate due to intensity of illness or inability to take PO   Dispo: The patient is from: Home              Anticipated d/c is to: SNF              Anticipated d/c date is: 2 days              Patient currently is not medically stable to d/c.         Barriers For Discharge :   Consults  :   None  Procedures  : None  DVT Prophylaxis  :  [place] lovenox  Lab Results  Component Value Date   PLT 71 (L) 01/16/2020    Antibiotics  :    Anti-infectives (From admission, onward)   None        Objective:   Vitals:   01/16/20 0000 01/16/20 0400 01/16/20 0806 01/16/20 1201  BP: 105/75 92/71 107/76 99/80  Pulse: 83 69 70 73  Resp: (!) 21 20 (!) 21 20  Temp: 98.2 F (36.8 C) 97.7 F (36.5 C) 97.7 F (36.5 C)   TempSrc: Oral Oral Oral Oral  SpO2: 96% 96% 98% 96%  Weight:      Height:        Wt Readings from Last 3 Encounters:  01/15/20 63.5 kg     Intake/Output Summary (Last 24 hours) at 01/16/2020 1550 Last data filed at 01/16/2020 1230 Gross per 24 hour  Intake 3255.4 ml  Output 1025 ml  Net 2230.4 ml     Physical Exam  Awake Alert, Oriented X 3, No new F.N deficits, Normal affect Symmetrical Chest wall movement, Good air movement bilaterally, CTAB RRR,No Gallops,Rubs or new Murmurs, No Parasternal Heave +ve B.Sounds, Abd Soft, No tenderness,  No rebound - guarding or rigidity. No Cyanosis, Clubbing or edema, No new Rash or bruise     Data Review:    CBC Recent Labs  Lab 01/15/20 1357 01/16/20 0822  WBC 8.0 5.0  HGB 14.6 13.4  HCT 45.6 40.5  PLT PLATELET CLUMPS NOTED ON SMEAR, COUNT APPEARS DECREASED 71*  MCV 107.0* 104.1*  MCH 34.3* 34.4*  MCHC 32.0 33.1  RDW 13.1 13.1  LYMPHSABS 0.9  --   MONOABS 0.8  --   EOSABS 0.0  --   BASOSABS 0.0  --  Chemistries  Recent Labs  Lab 01/15/20 1357 01/16/20 0822  NA 147* 144  K 3.8 3.8  CL 113* 116*  CO2 21* 22  GLUCOSE 128* 97  BUN 67* 53*  CREATININE 6.69* 2.49*  CALCIUM 8.7* 8.5*   ------------------------------------------------------------------------------------------------------------------ No results for input(s): CHOL, HDL, LDLCALC, TRIG, CHOLHDL, LDLDIRECT in the last 72 hours.  No results found for:  HGBA1C ------------------------------------------------------------------------------------------------------------------ Recent Labs    01/16/20 0821  TSH 0.629   ------------------------------------------------------------------------------------------------------------------ No results for input(s): VITAMINB12, FOLATE, FERRITIN, TIBC, IRON, RETICCTPCT in the last 72 hours.  Coagulation profile No results for input(s): INR, PROTIME in the last 168 hours.  No results for input(s): DDIMER in the last 72 hours.  Cardiac Enzymes No results for input(s): CKMB, TROPONINI, MYOGLOBIN in the last 168 hours.  Invalid input(s): CK ------------------------------------------------------------------------------------------------------------------ No results found for: BNP  Inpatient Medications  Scheduled Meds: . feeding supplement (NEPRO CARB STEADY)  237 mL Oral TID WC  . melatonin  3-9 mg Oral QHS  . mometasone-formoterol  2 puff Inhalation BID  . montelukast  10 mg Oral Daily  . pantoprazole  40 mg Oral Daily  . tamsulosin  0.4 mg Oral QPM  . umeclidinium bromide  1 puff Inhalation Daily   Continuous Infusions: . sodium chloride 100 mL/hr at 01/16/20 0921   PRN Meds:.acetaminophen **OR** acetaminophen  Micro Results Recent Results (from the past 240 hour(s))  SARS CORONAVIRUS 2 (TAT 6-24 HRS) Nasopharyngeal Nasopharyngeal Swab     Status: None   Collection Time: 01/15/20  4:40 PM   Specimen: Nasopharyngeal Swab  Result Value Ref Range Status   SARS Coronavirus 2 NEGATIVE NEGATIVE Final    Comment: (NOTE) SARS-CoV-2 target nucleic acids are NOT DETECTED. The SARS-CoV-2 RNA is generally detectable in upper and lower respiratory specimens during the acute phase of infection. Negative results do not preclude SARS-CoV-2 infection, do not rule out co-infections with other pathogens, and should not be used as the sole basis for treatment or other patient management  decisions. Negative results must be combined with clinical observations, patient history, and epidemiological information. The expected result is Negative. Fact Sheet for Patients: SugarRoll.be Fact Sheet for Healthcare Providers: https://www.woods-mathews.com/ This test is not yet approved or cleared by the [place] FDA and  has been authorized for detection and/or diagnosis of SARS-CoV-2 by FDA under an Emergency Use Authorization (EUA). This EUA will remain  in effect (meaning this test can be used) for the duration of the COVID-19 declaration under Section 56 4(b)(1) of the Act, 21 U.S.C. section 360bbb-3(b)(1), unless the authorization is terminated or revoked sooner. Performed at [facility], [place] [address], [place], [place] [address]   MRSA PCR Screening     Status: None   Collection Time: 01/15/20  6:33 PM   Specimen: Nasopharyngeal  Result Value Ref Range Status   MRSA by PCR NEGATIVE NEGATIVE Final    Comment:        The GeneXpert MRSA Assay (FDA approved for NASAL specimens only), is one component of a comprehensive MRSA colonization surveillance program. It is not intended to diagnose MRSA infection nor to guide or monitor treatment for MRSA infections. Performed at [facility], [place] [address], [place], [place] [address]     Radiology Reports CT Head Wo Contrast  Result Date: 01/15/2020 CLINICAL DATA:  Head trauma. Fell at home following a dizzy/syncopal episode last night. Found on the floor this morning. EXAM: CT HEAD WITHOUT CONTRAST CT CERVICAL SPINE WITHOUT  CONTRAST TECHNIQUE: Multidetector CT imaging of the head and cervical spine was performed following the standard protocol without intravenous contrast. Multiplanar CT image reconstructions of the cervical spine were also generated. COMPARISON:  10/29/2017. FINDINGS: CT HEAD FINDINGS Brain: No evidence of acute infarction, hemorrhage, hydrocephalus,  extra-axial collection or mass lesion/mass effect. There is ventricular and sulcal enlargement reflecting atrophy, advanced for age. Mild periventricular white matter hypoattenuation is noted consistent with chronic microvascular ischemic change. These findings are stable from the prior head CT. Vascular: No hyperdense vessel or unexpected calcification. Skull: Normal. Negative for fracture or focal lesion. Sinuses/Orbits: Globes and orbits are unremarkable. Mucous retention cyst in the left maxillary sinus. Sinuses otherwise clear. Other: None. CT CERVICAL SPINE FINDINGS Alignment: Normal. Skull base and vertebrae: No acute fracture. No primary bone lesion or focal pathologic process. Soft tissues and spinal canal: No prevertebral fluid or swelling. No visible canal hematoma. Disc levels: Moderate loss of disc height at C4-C5. Mild loss of disc height at C5-C6. Mild to moderate loss of disc height at C6-C7. Mild spondylotic disc bulging with endplate spurring noted at these levels. No convincing disc herniation. Upper chest: No acute findings. Significant centrilobular emphysema noted at the lung apices. Other: None. IMPRESSION: HEAD CT 1. No acute intracranial abnormalities. 2. Atrophy advanced for age. Mild chronic microvascular ischemic change. Stable appearance from the prior head CT. CERVICAL CT 1. No fracture or acute finding. Electronically Signed   By: Lajean Manes M.D.   On: 01/15/2020 15:31   CT Cervical Spine Wo Contrast  Result Date: 01/15/2020 CLINICAL DATA:  Head trauma. Fell at home following a dizzy/syncopal episode last night. Found on the floor this morning. EXAM: CT HEAD WITHOUT CONTRAST CT CERVICAL SPINE WITHOUT CONTRAST TECHNIQUE: Multidetector CT imaging of the head and cervical spine was performed following the standard protocol without intravenous contrast. Multiplanar CT image reconstructions of the cervical spine were also generated. COMPARISON:  10/29/2017. FINDINGS: CT HEAD FINDINGS  Brain: No evidence of acute infarction, hemorrhage, hydrocephalus, extra-axial collection or mass lesion/mass effect. There is ventricular and sulcal enlargement reflecting atrophy, advanced for age. Mild periventricular white matter hypoattenuation is noted consistent with chronic microvascular ischemic change. These findings are stable from the prior head CT. Vascular: No hyperdense vessel or unexpected calcification. Skull: Normal. Negative for fracture or focal lesion. Sinuses/Orbits: Globes and orbits are unremarkable. Mucous retention cyst in the left maxillary sinus. Sinuses otherwise clear. Other: None. CT CERVICAL SPINE FINDINGS Alignment: Normal. Skull base and vertebrae: No acute fracture. No primary bone lesion or focal pathologic process. Soft tissues and spinal canal: No prevertebral fluid or swelling. No visible canal hematoma. Disc levels: Moderate loss of disc height at C4-C5. Mild loss of disc height at C5-C6. Mild to moderate loss of disc height at C6-C7. Mild spondylotic disc bulging with endplate spurring noted at these levels. No convincing disc herniation. Upper chest: No acute findings. Significant centrilobular emphysema noted at the lung apices. Other: None. IMPRESSION: HEAD CT 1. No acute intracranial abnormalities. 2. Atrophy advanced for age. Mild chronic microvascular ischemic change. Stable appearance from the prior head CT. CERVICAL CT 1. No fracture or acute finding. Electronically Signed   By: Lajean Manes M.D.   On: 01/15/2020 15:31   US RENAL  Result Date: 01/15/2020 CLINICAL DATA:  Acute kidney injury. EXAM: RENAL / URINARY TRACT ULTRASOUND COMPLETE COMPARISON:  CT angiography of the chest abdomen pelvis 12/02/2016 FINDINGS: Right Kidney: Renal measurements: 11.4 x 4.9 x 5.5 cm = volume: 159 mL.  Suggestion of mild increased renal echogenicity. Cyst in the lower pole measures 1.6 x 1.5 x 1.6 cm. No solid mass or hydronephrosis visualized. Left Kidney: Renal measurements: 11.4  x 5.2 x 4.3 cm = volume: 131 mL. Suggestion of increased renal echogenicity. No mass or hydronephrosis visualized. Bladder: Echogenic debris in the bladder which is layering dependently. No bladder wall thickening. Neither ureteral jet is visualized. Other: None. IMPRESSION: 1. Debris in the urinary bladder, recommend correlation with urinalysis. No bladder wall thickening. 2. Mild bilateral increased renal parenchymal echogenicity suggesting chronic medical renal disease. No hydronephrosis. 3. Simple cyst in the right kidney. Electronically Signed   By: Keith Rake M.D.   On: 01/15/2020 21:15   DG Chest Portable 1 View  Result Date: 01/15/2020 CLINICAL DATA:  Trauma secondary to a fall yesterday. EXAM: PORTABLE CHEST 1 VIEW COMPARISON:  11/11/2019 FINDINGS: The heart size and pulmonary vascularity are normal. There is slight haziness at the right lung base which could represent a small infiltrate or posterior fusion. A lateral view of the chest may be helpful. No pneumothorax. No acute bone abnormality. IMPRESSION: Possible small infiltrate or posterior effusion at the right lung base. Lateral view of the chest may be helpful for further evaluation. Electronically Signed   By: Lorriane Shire M.D.   On: 01/15/2020 14:37     Phillips Climes M.D on 01/16/2020 at 3:50 PM  Between 7am to 7pm - Pager - [phone]  After 7pm go to [email or web address] - password [email or web address]  [facility] -  Office  [phone]

## 2020-01-16 NOTE — Progress Notes (Signed)
RE: [name] Herring DOB: [birth year]  Please be advised that the above-named patient will require a short-term nursing home stay - anticipated 30 days or less for rehabilitation and strengthening. The plan is for return home.

## 2020-01-16 NOTE — Progress Notes (Addendum)
CSW received notification from [facility] that she spoke with patient and family and they are requesting SNF placement. CSW will fax out referral. Potential barriers include psych and substance use history. Pasrr under review.   Candi Profit LCSW

## 2020-01-16 NOTE — Evaluation (Addendum)
Physical Therapy Evaluation Patient Details Name: [name] Herring MRN: [id number] DOB: 01-02-1961 Today's Date: 01/16/2020   History of Present Illness  59 year old male admitted 01/15/20 after being found down at home after fall/near syncopal event where he describes his legs as "giving out." He was too weak to get up. A neighbor checked in on him in the morning and assisted him up then called 911. Patient reports weakness x 1 week and diarrhea x 3 days. patient was hypotensive upon EMS arrival. Blood sugar in the 300s. Patient with acute kidney injury from hypotension, prerenal and from volume depletion. CXR: possible small infiltrate or posterior effusion of R lung base. CT head: no fracture or acute finding. CT Cspine: No acute abnormality, atrophy advacned age, stable from prior head CT. US renal: recommend correlation wtih urinalysis, findings suggestive of chronic renal disease. Of note, patient with recent behavioral health ED visits, most recent in February 2021 and admitted to [facility] facility for 10 days. PMH: hypertension, depression, polysubstance abuse (tobacco, alcohol, cocaine), DVT, PE on Eliquis , HIV     Clinical Impression  Patient presents with impaired balance, decreased activity tolerance, and is below his PLOF. He reports feeling 75% to his baseline level of mobility. Patient requires cues for safe use of RW for mobility especially in small spaces. BP with normal response to activity. Patient denies dizziness or lightheadedness. Anticipate in a few days, patient will progress his mobility in order to discharge home with home PT and use of RW. If patient is to discharge today, recommend someone stay with him for a few days or patient stays with his children for a few days, until he is at his baseline level of mobility. If patient does not have assistance at time of discharge, recommend SNF for short term rehabilitation until patient is at baseline.   Supine with HOB approx 20  degrees: BP 108/77, HR 68 bpm, O2 sat 97% on room air, RR 14 Sitting EOB: BP 107/77, HR 71 bpm Standing: BP 108/84 Post ambulation, taken sitting EOB: BP 115/78    Follow Up Recommendations Home health PT;Supervision - Intermittent;Supervision/Assistance - 24 hour((if patient is to discharge home today, recommend 24/7)) If 24/7 assist cannot be provided at time of discharge, patient would benefit from SNF to progress him back to his PLOF.    Equipment Recommendations  Rolling walker with 5" wheels;Other (comment)(shower chair)       Precautions / Restrictions Precautions Precautions: Fall Restrictions Weight Bearing Restrictions: No      Mobility  Bed Mobility Overal bed mobility: Modified Independent General bed mobility comments: minor use of bedrail, HOB approx 20 degrees  Transfers Overall transfer level: Needs assistance Equipment used: Rolling walker (2 wheeled) Transfers: Sit to/from Stand Sit to Stand: Min guard;Supervision General transfer comment: sit>stand from EOB x 2 trials, stand>sit on chair, cues for hand placement for 2nd sit>stand and for stand>sit on chair  Ambulation/Gait Ambulation/Gait assistance: Min guard Gait Distance (Feet): 75 Feet(10) Assistive device: Rolling walker (2 wheeled) Gait Pattern/deviations: Trunk flexed;Step-through pattern     General Gait Details: Cues to stand closer to RW for increased stability. Patient reports DOE. O2 sat 85% initially after gait trial but quickly increased to 92% and then 94% on room air.  Stairs Stairs: Yes Stairs assistance: Min guard Stair Management: One rail Left;One rail Right;Step to pattern(L rail then R) Number of Stairs: 7 General stair comments: Demo and cues for safe technique. Patient fatigued so unable to continue negotiating  stairs.     Balance Overall balance assessment: Needs assistance Sitting-balance support: Feet supported Sitting balance-Leahy Scale: Good     Standing balance  support: Single extremity supported;Bilateral upper extremity supported Standing balance-Leahy Scale: Fair Standing balance comment: Patient denied lightheadedness or dizziness.       Pertinent Vitals/Pain Pain Assessment: 0-10 Pain Score: 7  Pain Location: bilat hips Pain Descriptors / Indicators: Tightness Pain Intervention(s): Monitored during session;Limited activity within patient's tolerance;Repositioned    Home Living Family/patient expects to be discharged to:: Private residence Living Arrangements: Alone Available Help at Discharge: Family Type of Home: Apartment Home Access: Stairs to enter Entrance Stairs-Rails: Right;Left(R then L rail) Entrance Stairs-Number of Steps: 3 FOS Home Layout: One level Home Equipment: Environmental consultant - 2 wheels Additional Comments: kids (live in [place]) come on weekends and help with groceries, home health for medication mgmt Tues and Thurs    Prior Function Level of Independence: Independent;Independent with assistive device(s)         Comments: modI with RW outside the home, independent inside the home        Extremity/Trunk Assessment    Lower Extremity Assessment Lower Extremity Assessment: Generalized weakness       Communication   Communication: No difficulties  Cognition Arousal/Alertness: Awake/alert Behavior During Therapy: WFL for tasks assessed/performed Overall Cognitive Status: Within Functional Limits for tasks assessed    General Comments General comments (skin integrity, edema, etc.): Patient on room air. BP with normal response to mobility. HR stable.        Assessment/Plan    PT Assessment Patient needs continued PT services  PT Problem List Decreased strength;Decreased activity tolerance;Decreased balance;Decreased mobility;Decreased knowledge of use of DME;Decreased safety awareness;Pain       PT Treatment Interventions DME instruction;Gait training;Stair training;Functional mobility  training;Therapeutic activities;Therapeutic exercise;Balance training;Patient/family education    PT Goals (Current goals can be found in the Care Plan section)  Acute Rehab PT Goals Time For Goal Achievement: 01/29/20 Potential to Achieve Goals: Good    Frequency Min 3X/week   Barriers to discharge Decreased caregiver support;Inaccessible home environment 3 FOS to enter, lives alone       AM-PAC PT "6 Clicks" Mobility  Outcome Measure Help needed turning from your back to your side while in a flat bed without using bedrails?: None Help needed moving from lying on your back to sitting on the side of a flat bed without using bedrails?: None Help needed moving to and from a bed to a chair (including a wheelchair)?: A Little Help needed standing up from a chair using your arms (e.g., wheelchair or bedside chair)?: A Little Help needed to walk in hospital room?: A Little Help needed climbing 3-5 steps with a railing? : A Little 6 Click Score: 20    End of Session Equipment Utilized During Treatment: Gait belt Activity Tolerance: Patient tolerated treatment well;Patient limited by fatigue Patient left: in chair;with call bell/phone within reach;with chair alarm set Nurse Communication: Mobility status;Other (comment)(BP response) PT Visit Diagnosis: Unsteadiness on feet (R26.81);Other abnormalities of gait and mobility (R26.89);History of falling (Z91.81)    Time: [id number] PT Time Calculation (min) (ACUTE ONLY): 36 min   Charges:   PT Evaluation $PT Eval Moderate Complexity: 1 Mod          Birdie Hopes, PT, DPT Acute Rehab [phone] office    Birdie Hopes 01/16/2020, 9:20 AM

## 2020-01-16 NOTE — TOC Initial Note (Addendum)
Transition of Care [facility]) - Initial/Assessment Note    Patient Details  Name: [name] Herring MRN: [id number] Date of Birth: 13-Mar-1961  Transition of Care [facility]) CM/SW Contact:    Maryclare Labrador, RN Phone Number: 01/16/2020, 2:48 PM  Clinical Narrative:    PTA from home alone. Pt has recent admit that required psych facility placement at discharge - pt was subsequently discharged home.   CM had lengthy discussion with pt, [name] and pts sister Hassan Rowan. Pt will not have 24 hour supervision.  Family express desires for pt to be placed in a facility  (pt in agreement) if recommendation continues to be 24 hour supervision once he is stable for discharge.  Therapy informed of the lack of supervision at home and will reassess in the am.        Barriers with HH include; insurance and documented cocaine abuse                  Patient Goals and CMS Choice        Expected Discharge Plan and Services                                                Prior Living Arrangements/Services                       Activities of Daily Living      Permission Sought/Granted                  Emotional Assessment              Admission diagnosis:  Dehydration [E86.0] AKI (acute kidney injury) ([place]) [N17.9] Near syncope [R55] Fall, initial encounter [W19.XXXA] Patient Active Problem List   Diagnosis Date Noted  . HIV (human immunodeficiency virus infection) ([place]) 01/15/2020  . AKI (acute kidney injury) ([place]) 01/15/2020  . History of pulmonary embolism 01/15/2020   PCP:  Sonia Side., FNP Pharmacy:   [id number] - [place], [place] [id number] [place] [phone] Phone: [phone] Fax: [phone]     Social Determinants of Health (SDOH) Interventions    Readmission Risk Interventions No flowsheet data found.

## 2020-01-17 LAB — CBC
HCT: 36.2 % — ABNORMAL LOW (ref 39.0–52.0)
Hemoglobin: 11.9 g/dL — ABNORMAL LOW (ref 13.0–17.0)
MCH: 33.7 pg (ref 26.0–34.0)
MCHC: 32.9 g/dL (ref 30.0–36.0)
MCV: 102.5 fL — ABNORMAL HIGH (ref 80.0–100.0)
Platelets: 69 10*3/uL — ABNORMAL LOW (ref 150–400)
RBC: 3.53 MIL/uL — ABNORMAL LOW (ref 4.22–5.81)
RDW: 12.7 % (ref 11.5–15.5)
WBC: 4.1 10*3/uL (ref 4.0–10.5)
nRBC: 0 % (ref 0.0–0.2)

## 2020-01-17 LAB — BASIC METABOLIC PANEL
Anion gap: 5 (ref 5–15)
BUN: 39 mg/dL — ABNORMAL HIGH (ref 6–20)
CO2: 23 mmol/L (ref 22–32)
Calcium: 8.6 mg/dL — ABNORMAL LOW (ref 8.9–10.3)
Chloride: 117 mmol/L — ABNORMAL HIGH (ref 98–111)
Creatinine, Ser: 1.21 mg/dL (ref 0.61–1.24)
GFR calc Af Amer: >60 mL/min (ref >60)
GFR calc non Af Amer: >60 mL/min (ref >60)
Glucose, Bld: 93 mg/dL (ref 70–99)
Potassium: 4 mmol/L (ref 3.5–5.1)
Sodium: 145 mmol/L (ref 135–145)

## 2020-01-17 LAB — MAGNESIUM: Magnesium: 2.3 mg/dL (ref 1.7–2.4)

## 2020-01-17 MED ORDER — QUETIAPINE FUMARATE 25 MG PO TABS
100.0000 mg | ORAL_TABLET | Freq: Every day | ORAL | Status: DC
  Administered 2020-01-17 – 2020-01-18 (×2): 100 mg via ORAL
  Filled 2020-01-17 (×2): qty 4

## 2020-01-17 MED ORDER — RISPERIDONE 0.5 MG PO TABS: 1.0000 mg | ORAL_TABLET | ORAL | Status: DC

## 2020-01-17 MED ORDER — RISPERIDONE 0.5 MG PO TABS
1.0000 mg | ORAL_TABLET | Freq: Every day | ORAL | Status: DC
  Administered 2020-01-18 – 2020-01-19 (×2): 1 mg via ORAL
  Filled 2020-01-17 (×3): qty 2

## 2020-01-17 MED ORDER — BENZTROPINE MESYLATE 1 MG PO TABS
0.5000 mg | ORAL_TABLET | Freq: Two times a day (BID) | ORAL | Status: DC
  Administered 2020-01-17 – 2020-01-19 (×4): 0.5 mg via ORAL
  Filled 2020-01-17 (×5): qty 1

## 2020-01-17 MED ORDER — RISPERIDONE 0.5 MG PO TABS
2.0000 mg | ORAL_TABLET | Freq: Every day | ORAL | Status: DC
  Administered 2020-01-17 – 2020-01-18 (×2): 2 mg via ORAL
  Filled 2020-01-17 (×2): qty 4

## 2020-01-17 MED ORDER — VALBENAZINE TOSYLATE 80 MG PO CAPS: 80.0000 mg | ORAL_CAPSULE | Freq: Every day | ORAL | Status: DC

## 2020-01-17 MED ORDER — ENSURE ENLIVE PO LIQD
237.0000 mL | Freq: Three times a day (TID) | ORAL | Status: DC
  Administered 2020-01-17 – 2020-01-19 (×5): 237 mL via ORAL

## 2020-01-17 MED ORDER — APIXABAN 5 MG PO TABS
5.0000 mg | ORAL_TABLET | Freq: Two times a day (BID) | ORAL | Status: DC
  Administered 2020-01-17 – 2020-01-19 (×5): 5 mg via ORAL
  Filled 2020-01-17 (×5): qty 1

## 2020-01-17 NOTE — Progress Notes (Signed)
Physical Therapy Treatment Patient Details Name: [name] Herring MRN: [id number] DOB: [birth year] Today's Date: 01/17/2020    History of Present Illness 59 year old male admitted 01/15/20 after being found down at home after fall/near syncopal event where he describes his legs as "giving out." He was too weak to get up. A neighbor checked in on him in the morning and assisted him up then called 911. Patient reports weakness x 1 week and diarrhea x 3 days. patient was hypotensive upon EMS arrival. Blood sugar in the 300s. Patient with acute kidney injury from hypotension, prerenal and from volume depletion. CXR: possible small infiltrate or posterior effusion of R lung base. CT head: no fracture or acute finding. CT Cspine: No acute abnormality, atrophy advacned age, stable from prior head CT. US renal: recommend correlation wtih urinalysis, findings suggestive of chronic renal disease. Of note, patient with recent behavioral health ED visits, most recent in February 2021 and admitted to [facility] facility for 10 days. PMH: hypertension, depression, polysubstance abuse (tobacco, alcohol, cocaine), DVT, PE on Eliquis , HIV     PT Comments    Pt eager to do some activity, limited by loose stools.  Pt up in the room without assist, but needs min guard/supervision for safety at this point.  Emphasis on standing exercise and progression of gait stability.    Follow Up Recommendations  SNF;Other (comment)(family and pt wish SNF due to pt with no supervision)     Equipment Recommendations  Rolling walker with 5" wheels;Other (comment)(TBA next venue)    Recommendations for Other Services       Precautions / Restrictions Precautions Precautions: Fall    Mobility  Bed Mobility Overal bed mobility: Modified Independent                Transfers Overall transfer level: Needs assistance Equipment used: Rolling walker (2 wheeled);None Transfers: Sit to/from Stand Sit to Stand:  Supervision;Min guard            Ambulation/Gait Ambulation/Gait assistance: Min guard;Min assist(min during turns) Gait Distance (Feet): 75 Feet(then140, and additional 110 feet after standing rest) Assistive device: Rolling walker (2 wheeled) Gait Pattern/deviations: Step-through pattern;Trunk flexed;Narrow base of support Gait velocity: slower. Gait velocity interpretation: 1.31 - 2.62 ft/sec, indicative of limited community ambulator General Gait Details: pt generally unsteady when asked to stand more upright. cues for posture and proximity to the RW.  Needing min A during turns when pt gets tangled up in the RW and ends up outside the RW.   Stairs             Wheelchair Mobility    Modified Rankin (Stroke Patients Only)       Balance Overall balance assessment: Needs assistance   Sitting balance-Leahy Scale: Good       Standing balance-Leahy Scale: Fair Standing balance comment: pt reporting no lightheadedness,  self cleaning after 2 watery stools                            Cognition Arousal/Alertness: Awake/alert Behavior During Therapy: WFL for tasks assessed/performed Overall Cognitive Status: Within Functional Limits for tasks assessed                                        Exercises      General Comments        Pertinent  Vitals/Pain Pain Assessment: Faces Faces Pain Scale: No hurt Pain Intervention(s): Monitored during session    Home Living                      Prior Function            PT Goals (current goals can now be found in the care plan section) Acute Rehab PT Goals Patient Stated Goal: I feel the need to get some rehab before going home. Time For Goal Achievement: 01/29/20 Potential to Achieve Goals: Good Progress towards PT goals: Progressing toward goals    Frequency    Min 3X/week      PT Plan Current plan remains appropriate    Co-evaluation              AM-PAC PT  "6 Clicks" Mobility   Outcome Measure  Help needed turning from your back to your side while in a flat bed without using bedrails?: None Help needed moving from lying on your back to sitting on the side of a flat bed without using bedrails?: None Help needed moving to and from a bed to a chair (including a wheelchair)?: None Help needed standing up from a chair using your arms (e.g., wheelchair or bedside chair)?: None Help needed to walk in hospital room?: A Little Help needed climbing 3-5 steps with a railing? : A Little 6 Click Score: 22    End of Session   Activity Tolerance: Patient tolerated treatment well;Patient limited by fatigue Patient left: in chair;with call bell/phone within reach;with chair alarm set Nurse Communication: Mobility status PT Visit Diagnosis: Unsteadiness on feet (R26.81);Other abnormalities of gait and mobility (R26.89);History of falling (Z91.81)     Time: [id number] PT Time Calculation (min) (ACUTE ONLY): 29 min  Charges:  $Gait Training: 8-22 mins $Therapeutic Exercise: 8-22 mins                     01/17/2020  Ginger Carne., PT Acute Rehabilitation Services [phone]  (pager) [phone]  (office)   Tessie Fass Delvecchio Madole 01/17/2020, 1:08 PM

## 2020-01-17 NOTE — Progress Notes (Signed)
Pharmacist advised RN about how Minus Liberty is not stocked in our pharmacy and to ask patient about having family to bring in medication. Per patient, all his family is out of town and is unable to bring to him in hospital.

## 2020-01-17 NOTE — Progress Notes (Signed)
PROGRESS NOTE                                                                                                                                                                                                             Patient Demographics:    [name] Herring, is a 59 y.o. male, DOB - 08-Jan-1961, [id number]  Admit date - 01/15/2020   Admitting Physician Albertine Patricia, MD  Outpatient Primary MD for the patient is Sonia Side., FNP  LOS - 2   Chief Complaint  Patient presents with  . Fall       Brief Narrative   Willliam Herring is a 59 y.o. male, with  medical history significant ofhypertension, depression, polysubstance abuse (tobacco, alcohol, cocaine) in the past , DVT, PE on Eliquis , HIV following with [name] Herring, ID clinic ,schizophrenia, immunodeficiency disorder, was brought to ED secondary to fall,  and near syncope, he was noted by EMS to be low systolic blood pressure in the 70s, same thing in ED, but he did respond to fluid bolus, noted to have increased creatinine of 6.7, no evidence of rhabdo muscle he was admitted for further work-up .   Subjective:    [name] Herring today reports he is feeling better, he reports still poor appetite, reports weakness has improved, denies any chest pain or lightheadedness.   Assessment  & Plan :    Active Problems:   HIV (human immunodeficiency virus infection) ([place])   AKI (acute kidney injury) ([place])   History of pulmonary embolism   Acute kidney injury -Most likely ATN, from hypotension and low blood pressure, in the setting of using lisinopril, and volume depletion/dehydration. -Continue to hold lisinopril. -No evidence of urine retention on physical exam. -Creatnine  was 6.7 on admission, improving with IV fluids, this morning at 1.2. -Renal ultrasound with no evidence of hydronephrosis, but some evidence of chronic kidney disease. -Total CK within normal limit -Avoid nephrotoxic  medications. -Resolved level, TSH all within normal limit.  HIV -Patient report he has been compliant with his medications,  BIKTARVY has been held on admission given creatinine clearance less than 30, now he is back on his meds given kidney function has improved.  Hypertension/hypotension -Patient with significant hypotension on presentation, this appears to be a clinical dehydration, cortisol and TSH within normal limit. -Hypotension has  resolved, continue would lower his IV fluid dose, meanwhile we will hold initiating any antihypertensive regimen.  History of PE and DVT -He is on Eliquis, he has history of IVC filter, Eliquis initially held given acute renal failure, knee function has resolved will resume Eliquis, will monitor closely given thrombocytopenia .  Thrombocytopenia -will continue to monitor closely  Severe Protein calorie malnutrition - on Nepro  Prolonged QTC -QTC is 544 on admission, has resolved, repeat EKG showing QTC of 440.  Schizophrenia and depression -He denies any suicidal thoughts or ideations currently -His QTC has improved, will resume on lower dose request, home dose Risperdal, and Ingrezza.     COVID-19 Labs  No results for input(s): DDIMER, FERRITIN, LDH, CRP in the last 72 hours.  Lab Results  Component Value Date   [place] NEGATIVE 01/15/2020     Code Status : Full  Family Communication  : D/W daughter on 4/22  Disposition Plan  :  Status is: Inpatient  Remains inpatient appropriate because:  significant thrombocytopenia, and on IV fluids.   Dispo: The patient is from: Home              Anticipated d/c is to: SNF              Anticipated d/c date is: 2 days              Patient currently is not medically stable to d/c.     Consults  :  None  Procedures  : None  DVT Prophylaxis  :  Eliquis  Lab Results  Component Value Date   PLT 69 (L) 01/17/2020    Antibiotics  :    Anti-infectives (From admission,  onward)   Start     Dose/Rate Route Frequency Ordered Stop   01/16/20 1700  bictegravir-emtricitabine-tenofovir AF (BIKTARVY) 50-200-25 MG per tablet 1 tablet     1 tablet Oral Daily 01/16/20 1601          Objective:   Vitals:   01/17/20 0415 01/17/20 0800 01/17/20 0804 01/17/20 1235  BP:  137/90  (!) 149/97  Pulse:  68 72 72  Resp:  (!) 27 20 18   Temp: 98 F (36.7 C) 98.1 F (36.7 C)    TempSrc: Oral Oral    SpO2:  95% 96% 97%  Weight:      Height:        Wt Readings from Last 3 Encounters:  01/15/20 63.5 kg     Intake/Output Summary (Last 24 hours) at 01/17/2020 1524 Last data filed at 01/17/2020 1356 Gross per 24 hour  Intake 2301.67 ml  Output 575 ml  Net 1726.67 ml     Physical Exam  Awake Alert, Oriented X 3, No new F.N deficits, Normal affect Symmetrical Chest wall movement, Good air movement bilaterally, CTAB RRR,No Gallops,Rubs or new Murmurs, No Parasternal Heave +ve B.Sounds, Abd Soft, No tenderness, No rebound - guarding or rigidity. No Cyanosis, Clubbing or edema, No new Rash or bruise       Data Review:    CBC Recent Labs  Lab 01/15/20 1357 01/16/20 0822 01/17/20 0405  WBC 8.0 5.0 4.1  HGB 14.6 13.4 11.9*  HCT 45.6 40.5 36.2*  PLT PLATELET CLUMPS NOTED ON SMEAR, COUNT APPEARS DECREASED 71* 69*  MCV 107.0* 104.1* 102.5*  MCH 34.3* 34.4* 33.7  MCHC 32.0 33.1 32.9  RDW 13.1 13.1 12.7  LYMPHSABS 0.9  --   --   MONOABS 0.8  --   --  EOSABS 0.0  --   --   BASOSABS 0.0  --   --     Chemistries  Recent Labs  Lab 01/15/20 1357 01/16/20 0822 01/17/20 0405  NA 147* 144 145  K 3.8 3.8 4.0  CL 113* 116* 117*  CO2 21* 22 23  GLUCOSE 128* 97 93  BUN 67* 53* 39*  CREATININE 6.69* 2.49* 1.21  CALCIUM 8.7* 8.5* 8.6*  MG  --   --  2.3   ------------------------------------------------------------------------------------------------------------------ No results for input(s): CHOL, HDL, LDLCALC, TRIG, CHOLHDL, LDLDIRECT in the last 72  hours.  No results found for: HGBA1C ------------------------------------------------------------------------------------------------------------------ Recent Labs    01/16/20 0821  TSH 0.629   ------------------------------------------------------------------------------------------------------------------ No results for input(s): VITAMINB12, FOLATE, FERRITIN, TIBC, IRON, RETICCTPCT in the last 72 hours.  Coagulation profile No results for input(s): INR, PROTIME in the last 168 hours.  No results for input(s): DDIMER in the last 72 hours.  Cardiac Enzymes No results for input(s): CKMB, TROPONINI, MYOGLOBIN in the last 168 hours.  Invalid input(s): CK ------------------------------------------------------------------------------------------------------------------ No results found for: BNP  Inpatient Medications  Scheduled Meds: . apixaban  5 mg Oral BID  . bictegravir-emtricitabine-tenofovir AF  1 tablet Oral Daily  . feeding supplement (NEPRO CARB STEADY)  237 mL Oral TID WC  . melatonin  3-9 mg Oral QHS  . mometasone-formoterol  2 puff Inhalation BID  . montelukast  10 mg Oral Daily  . pantoprazole  40 mg Oral Daily  . tamsulosin  0.4 mg Oral QPM  . umeclidinium bromide  1 puff Inhalation Daily   Continuous Infusions: . sodium chloride 75 mL/hr at 01/16/20 2049   PRN Meds:.acetaminophen **OR** acetaminophen  Micro Results Recent Results (from the past 240 hour(s))  SARS CORONAVIRUS 2 (TAT 6-24 HRS) Nasopharyngeal Nasopharyngeal Swab     Status: None   Collection Time: 01/15/20  4:40 PM   Specimen: Nasopharyngeal Swab  Result Value Ref Range Status   SARS Coronavirus 2 NEGATIVE NEGATIVE Final    Comment: (NOTE) SARS-CoV-2 target nucleic acids are NOT DETECTED. The SARS-CoV-2 RNA is generally detectable in upper and lower respiratory specimens during the acute phase of infection. Negative results do not preclude SARS-CoV-2 infection, do not rule  out co-infections with other pathogens, and should not be used as the sole basis for treatment or other patient management decisions. Negative results must be combined with clinical observations, patient history, and epidemiological information. The expected result is Negative. Fact Sheet for Patients: SugarRoll.be Fact Sheet for Healthcare Providers: https://www.woods-mathews.com/ This test is not yet approved or cleared by the [place] FDA and  has been authorized for detection and/or diagnosis of SARS-CoV-2 by FDA under an Emergency Use Authorization (EUA). This EUA will remain  in effect (meaning this test can be used) for the duration of the COVID-19 declaration under Section 56 4(b)(1) of the Act, 21 U.S.C. section 360bbb-3(b)(1), unless the authorization is terminated or revoked sooner. Performed at [facility], [place] [address], [place], [place] [address]   MRSA PCR Screening     Status: None   Collection Time: 01/15/20  6:33 PM   Specimen: Nasopharyngeal  Result Value Ref Range Status   MRSA by PCR NEGATIVE NEGATIVE Final    Comment:        The GeneXpert MRSA Assay (FDA approved for NASAL specimens only), is one component of a comprehensive MRSA colonization surveillance program. It is not intended to diagnose MRSA infection nor to guide or monitor treatment for MRSA infections.  Performed at [facility], [place] [address], [place], [place] [address]     Radiology Reports CT Head Wo Contrast  Result Date: 01/15/2020 CLINICAL DATA:  Head trauma. Fell at home following a dizzy/syncopal episode last night. Found on the floor this morning. EXAM: CT HEAD WITHOUT CONTRAST CT CERVICAL SPINE WITHOUT CONTRAST TECHNIQUE: Multidetector CT imaging of the head and cervical spine was performed following the standard protocol without intravenous contrast. Multiplanar CT image reconstructions of the cervical spine were also  generated. COMPARISON:  10/29/2017. FINDINGS: CT HEAD FINDINGS Brain: No evidence of acute infarction, hemorrhage, hydrocephalus, extra-axial collection or mass lesion/mass effect. There is ventricular and sulcal enlargement reflecting atrophy, advanced for age. Mild periventricular white matter hypoattenuation is noted consistent with chronic microvascular ischemic change. These findings are stable from the prior head CT. Vascular: No hyperdense vessel or unexpected calcification. Skull: Normal. Negative for fracture or focal lesion. Sinuses/Orbits: Globes and orbits are unremarkable. Mucous retention cyst in the left maxillary sinus. Sinuses otherwise clear. Other: None. CT CERVICAL SPINE FINDINGS Alignment: Normal. Skull base and vertebrae: No acute fracture. No primary bone lesion or focal pathologic process. Soft tissues and spinal canal: No prevertebral fluid or swelling. No visible canal hematoma. Disc levels: Moderate loss of disc height at C4-C5. Mild loss of disc height at C5-C6. Mild to moderate loss of disc height at C6-C7. Mild spondylotic disc bulging with endplate spurring noted at these levels. No convincing disc herniation. Upper chest: No acute findings. Significant centrilobular emphysema noted at the lung apices. Other: None. IMPRESSION: HEAD CT 1. No acute intracranial abnormalities. 2. Atrophy advanced for age. Mild chronic microvascular ischemic change. Stable appearance from the prior head CT. CERVICAL CT 1. No fracture or acute finding. Electronically Signed   By: Lajean Manes M.D.   On: 01/15/2020 15:31   CT Cervical Spine Wo Contrast  Result Date: 01/15/2020 CLINICAL DATA:  Head trauma. Fell at home following a dizzy/syncopal episode last night. Found on the floor this morning. EXAM: CT HEAD WITHOUT CONTRAST CT CERVICAL SPINE WITHOUT CONTRAST TECHNIQUE: Multidetector CT imaging of the head and cervical spine was performed following the standard protocol without intravenous contrast.  Multiplanar CT image reconstructions of the cervical spine were also generated. COMPARISON:  10/29/2017. FINDINGS: CT HEAD FINDINGS Brain: No evidence of acute infarction, hemorrhage, hydrocephalus, extra-axial collection or mass lesion/mass effect. There is ventricular and sulcal enlargement reflecting atrophy, advanced for age. Mild periventricular white matter hypoattenuation is noted consistent with chronic microvascular ischemic change. These findings are stable from the prior head CT. Vascular: No hyperdense vessel or unexpected calcification. Skull: Normal. Negative for fracture or focal lesion. Sinuses/Orbits: Globes and orbits are unremarkable. Mucous retention cyst in the left maxillary sinus. Sinuses otherwise clear. Other: None. CT CERVICAL SPINE FINDINGS Alignment: Normal. Skull base and vertebrae: No acute fracture. No primary bone lesion or focal pathologic process. Soft tissues and spinal canal: No prevertebral fluid or swelling. No visible canal hematoma. Disc levels: Moderate loss of disc height at C4-C5. Mild loss of disc height at C5-C6. Mild to moderate loss of disc height at C6-C7. Mild spondylotic disc bulging with endplate spurring noted at these levels. No convincing disc herniation. Upper chest: No acute findings. Significant centrilobular emphysema noted at the lung apices. Other: None. IMPRESSION: HEAD CT 1. No acute intracranial abnormalities. 2. Atrophy advanced for age. Mild chronic microvascular ischemic change. Stable appearance from the prior head CT. CERVICAL CT 1. No fracture or acute finding. Electronically Signed  By: Lajean Manes M.D.   On: 01/15/2020 15:31   US RENAL  Result Date: 01/15/2020 CLINICAL DATA:  Acute kidney injury. EXAM: RENAL / URINARY TRACT ULTRASOUND COMPLETE COMPARISON:  CT angiography of the chest abdomen pelvis 12/02/2016 FINDINGS: Right Kidney: Renal measurements: 11.4 x 4.9 x 5.5 cm = volume: 159 mL. Suggestion of mild increased renal echogenicity.  Cyst in the lower pole measures 1.6 x 1.5 x 1.6 cm. No solid mass or hydronephrosis visualized. Left Kidney: Renal measurements: 11.4 x 5.2 x 4.3 cm = volume: 131 mL. Suggestion of increased renal echogenicity. No mass or hydronephrosis visualized. Bladder: Echogenic debris in the bladder which is layering dependently. No bladder wall thickening. Neither ureteral jet is visualized. Other: None. IMPRESSION: 1. Debris in the urinary bladder, recommend correlation with urinalysis. No bladder wall thickening. 2. Mild bilateral increased renal parenchymal echogenicity suggesting chronic medical renal disease. No hydronephrosis. 3. Simple cyst in the right kidney. Electronically Signed   By: Keith Rake M.D.   On: 01/15/2020 21:15   DG Chest Portable 1 View  Result Date: 01/15/2020 CLINICAL DATA:  Trauma secondary to a fall yesterday. EXAM: PORTABLE CHEST 1 VIEW COMPARISON:  11/11/2019 FINDINGS: The heart size and pulmonary vascularity are normal. There is slight haziness at the right lung base which could represent a small infiltrate or posterior fusion. A lateral view of the chest may be helpful. No pneumothorax. No acute bone abnormality. IMPRESSION: Possible small infiltrate or posterior effusion at the right lung base. Lateral view of the chest may be helpful for further evaluation. Electronically Signed   By: Lorriane Shire M.D.   On: 01/15/2020 14:37     Phillips Climes M.D on 01/17/2020 at 3:24 PM  Between 7am to 7pm - Pager - [phone]  After 7pm go to [email or web address] - password [facility]  [facility] -  Office  [phone]

## 2020-01-18 LAB — CBC
HCT: 35.1 % — ABNORMAL LOW (ref 39.0–52.0)
Hemoglobin: 12.1 g/dL — ABNORMAL LOW (ref 13.0–17.0)
MCH: 34.5 pg — ABNORMAL HIGH (ref 26.0–34.0)
MCHC: 34.5 g/dL (ref 30.0–36.0)
MCV: 100 fL (ref 80.0–100.0)
Platelets: 72 10*3/uL — ABNORMAL LOW (ref 150–400)
RBC: 3.51 MIL/uL — ABNORMAL LOW (ref 4.22–5.81)
RDW: 12.2 % (ref 11.5–15.5)
WBC: 4.5 10*3/uL (ref 4.0–10.5)
nRBC: 0 % (ref 0.0–0.2)

## 2020-01-18 LAB — BASIC METABOLIC PANEL
Anion gap: 8 (ref 5–15)
BUN: 13 mg/dL (ref 6–20)
CO2: 20 mmol/L — ABNORMAL LOW (ref 22–32)
Calcium: 8.7 mg/dL — ABNORMAL LOW (ref 8.9–10.3)
Chloride: 112 mmol/L — ABNORMAL HIGH (ref 98–111)
Creatinine, Ser: 0.73 mg/dL (ref 0.61–1.24)
GFR calc Af Amer: >60 mL/min (ref >60)
GFR calc non Af Amer: >60 mL/min (ref >60)
Glucose, Bld: 90 mg/dL (ref 70–99)
Potassium: 3.4 mmol/L — ABNORMAL LOW (ref 3.5–5.1)
Sodium: 140 mmol/L (ref 135–145)

## 2020-01-18 LAB — GLUCOSE, CAPILLARY: Glucose-Capillary: 128 mg/dL — ABNORMAL HIGH (ref 70–99)

## 2020-01-18 MED ORDER — POTASSIUM CHLORIDE CRYS ER 20 MEQ PO TBCR: 40.0000 meq | EXTENDED_RELEASE_TABLET | ORAL | Status: DC

## 2020-01-18 MED ORDER — AMLODIPINE BESYLATE 5 MG PO TABS
5.0000 mg | ORAL_TABLET | Freq: Every day | ORAL | Status: DC
  Administered 2020-01-18 – 2020-01-19 (×2): 5 mg via ORAL
  Filled 2020-01-18 (×2): qty 1

## 2020-01-18 MED ORDER — POTASSIUM CHLORIDE CRYS ER 20 MEQ PO TBCR
30.0000 meq | EXTENDED_RELEASE_TABLET | ORAL | Status: AC
  Administered 2020-01-18 (×2): 30 meq via ORAL
  Filled 2020-01-18 (×2): qty 1

## 2020-01-18 NOTE — Progress Notes (Signed)
   01/17/20 2012  Assess: MEWS Score  Temp (!) 97.4 F (36.3 C)  BP (!) 150/96  Pulse Rate 65  ECG Heart Rate 66  Resp (!) 24  Level of Consciousness Alert  SpO2 100 %  O2 Device Room Air  Patient Activity (if Appropriate) In chair  Assess: MEWS Score  MEWS Temp 0  MEWS Systolic 0  MEWS Pulse 0  MEWS RR 1  MEWS LOC 0  MEWS Score 1  MEWS Score Color Green  Assess: if the MEWS score is Yellow or Red  Were vital signs taken at a resting state? Yes  Focused Assessment Documented focused assessment  Early Detection of Sepsis Score *See Row Information* Low  MEWS guidelines implemented *See Row Information* No, other (Comment) (no acute changes)

## 2020-01-18 NOTE — Plan of Care (Signed)

## 2020-01-18 NOTE — Progress Notes (Signed)
PROGRESS NOTE                                                                                                                                                                                                             Patient Demographics:    [name] Herring, is a 59 y.o. male, DOB - [birth year], [id number]  Admit date - 01/15/2020   Admitting Physician Albertine Patricia, MD  Outpatient Primary MD for the patient is Sonia Side., FNP  LOS - 3   Chief Complaint  Patient presents with  . Fall       Brief Narrative   [name] Herring is a 59 y.o. male, with  medical history significant ofhypertension, depression, polysubstance abuse (tobacco, alcohol, cocaine) in the past , DVT, PE on Eliquis , HIV following with [name] ,schizophrenia, immunodeficiency disorder, was brought to ED secondary to fall,  and near syncope, he was noted by EMS to be low systolic blood pressure in the 70s, same thing in ED, but he did respond to fluid bolus, noted to have increased creatinine of 6.7, no evidence of rhabdo muscle he was admitted for further work-up .   Subjective:    [name] Herring today reports he is feeling better, his appetite has improved, reports he is feeling better.   Assessment  & Plan :    Active Problems:   HIV (human immunodeficiency virus infection) ([place])   AKI (acute kidney injury) ([place])   History of pulmonary embolism   Acute kidney injury -Most likely ATN, from hypotension and low blood pressure, in the setting of using lisinopril, and volume depletion/dehydration. -Continue to hold lisinopril. -No evidence of urine retention on physical exam. -Creatnine  was 6.7 on admission, improving with IV fluids, this morning at .073 -Renal ultrasound with no evidence of hydronephrosis, but some evidence of chronic kidney disease. -Total CK within normal limit -Avoid nephrotoxic medications. -Cortisol level, TSH all within  normal limit.  HIV -Patient report he has been compliant with his medications,  BIKTARVY has been held on admission given creatinine clearance less than 30, now he is back on his meds given kidney function has improved.  Hypertension/hypotension -Patient with significant hypotension on presentation, this appears to be a clinical dehydration, cortisol and TSH within normal limit. -Hypotension has resolved, continue with IV fluids, blood  pressure has increased, will start on Norvasc, will avoid lisinopril given AKI on admission.   History of PE and DVT -He is on Eliquis, he has history of IVC filter, Eliquis initially held given acute renal failure, knee function has resolved will resume Eliquis, will monitor closely given thrombocytopenia .  Thrombocytopenia -will continue to monitor closely  Severe Protein calorie malnutrition - on Nepro  Prolonged QTC -QTC is 544 on admission, has resolved, repeat EKG showing QTC of 440.  Schizophrenia and depression -He denies any suicidal thoughts or ideations currently -His QTC has improved, will resume on lower dose request, home dose Risperdal, and Ingrezza.  Hypokalemia -Repleted   COVID-19 Labs  No results for input(s): DDIMER, FERRITIN, LDH, CRP in the last 72 hours.  Lab Results  Component Value Date   SARSCOV2NAA NEGATIVE 01/15/2020     Code Status : Full  Family Communication  : Cussed with sister via phone had to his bedside  Disposition Plan  :  Status is: Inpatient  Remains inpatient appropriate because:  significant thrombocytopenia, and on IV fluids.   Dispo: The patient is from: Home              Anticipated d/c is to: Home              Anticipated d/c date is: 1 day              Patient currently is not medically stable to d/c.     Consults  :  None  Procedures  : None  DVT Prophylaxis  :  Eliquis  Lab Results  Component Value Date   PLT 72 (L) 01/18/2020    Antibiotics  :     Anti-infectives (From admission, onward)   Start     Dose/Rate Route Frequency Ordered Stop   01/16/20 1700  bictegravir-emtricitabine-tenofovir AF (BIKTARVY) 50-200-25 MG per tablet 1 tablet     1 tablet Oral Daily 01/16/20 1601          Objective:   Vitals:   01/18/20 0800 01/18/20 0829 01/18/20 1038 01/18/20 1138  BP: (!) 162/98  (!) 147/106 (!) 144/109  Pulse: 64  69 68  Resp: (!) 21  20 20   Temp: 98.1 F (36.7 C)     TempSrc: Oral     SpO2: 97% 96% 100% 97%  Weight:      Height:        Wt Readings from Last 3 Encounters:  01/15/20 63.5 kg     Intake/Output Summary (Last 24 hours) at 01/18/2020 1435 Last data filed at 01/18/2020 0930 Gross per 24 hour  Intake 1620.69 ml  Output 975 ml  Net 645.69 ml     Physical Exam  Awake Alert, Oriented X 3, thin appearing male, no new F.N deficits, Normal affect Symmetrical Chest wall movement, Good air movement bilaterally, CTAB RRR,No Gallops,Rubs or new Murmurs, No Parasternal Heave +ve B.Sounds, Abd Soft, No tenderness, No rebound - guarding or rigidity. No Cyanosis, Clubbing or edema, No new Rash or bruise        Data Review:    CBC Recent Labs  Lab 01/15/20 1357 01/16/20 0822 01/17/20 0405 01/18/20 0925  WBC 8.0 5.0 4.1 4.5  HGB 14.6 13.4 11.9* 12.1*  HCT 45.6 40.5 36.2* 35.1*  PLT PLATELET CLUMPS NOTED ON SMEAR, COUNT APPEARS DECREASED 71* 69* 72*  MCV 107.0* 104.1* 102.5* 100.0  MCH 34.3* 34.4* 33.7 34.5*  MCHC 32.0 33.1 32.9 34.5  RDW 13.1 13.1 12.7 12.2  LYMPHSABS 0.9  --   --   --   MONOABS 0.8  --   --   --   EOSABS 0.0  --   --   --   BASOSABS 0.0  --   --   --     Chemistries  Recent Labs  Lab 01/15/20 1357 01/16/20 0822 01/17/20 0405 01/18/20 0626  NA 147* 144 145 140  K 3.8 3.8 4.0 3.4*  CL 113* 116* 117* 112*  CO2 21* 22 23 20*  GLUCOSE 128* 97 93 90  BUN 67* 53* 39* 13  CREATININE 6.69* 2.49* 1.21 0.73  CALCIUM 8.7* 8.5* 8.6* 8.7*  MG  --   --  2.3  --     ------------------------------------------------------------------------------------------------------------------ No results for input(s): CHOL, HDL, LDLCALC, TRIG, CHOLHDL, LDLDIRECT in the last 72 hours.  No results found for: HGBA1C ------------------------------------------------------------------------------------------------------------------ Recent Labs    01/16/20 0821  TSH 0.629   ------------------------------------------------------------------------------------------------------------------ No results for input(s): VITAMINB12, FOLATE, FERRITIN, TIBC, IRON, RETICCTPCT in the last 72 hours.  Coagulation profile No results for input(s): INR, PROTIME in the last 168 hours.  No results for input(s): DDIMER in the last 72 hours.  Cardiac Enzymes No results for input(s): CKMB, TROPONINI, MYOGLOBIN in the last 168 hours.  Invalid input(s): CK ------------------------------------------------------------------------------------------------------------------ No results found for: BNP  Inpatient Medications  Scheduled Meds: . amLODipine  5 mg Oral Daily  . apixaban  5 mg Oral BID  . benztropine  0.5 mg Oral BID  . bictegravir-emtricitabine-tenofovir AF  1 tablet Oral Daily  . feeding supplement (ENSURE ENLIVE)  237 mL Oral TID BM  . melatonin  3-9 mg Oral QHS  . mometasone-formoterol  2 puff Inhalation BID  . montelukast  10 mg Oral Daily  . pantoprazole  40 mg Oral Daily  . potassium chloride  30 mEq Oral Q4H  . QUEtiapine  100 mg Oral QHS  . risperiDONE  1 mg Oral Daily   And  . risperiDONE  2 mg Oral QHS  . tamsulosin  0.4 mg Oral QPM  . umeclidinium bromide  1 puff Inhalation Daily  . Valbenazine Tosylate  1 capsule Oral Daily   Continuous Infusions: . sodium chloride 75 mL/hr at 01/17/20 2232   PRN Meds:.acetaminophen **OR** acetaminophen  Micro Results Recent Results (from the past 240 hour(s))  SARS CORONAVIRUS 2 (TAT 6-24 HRS) Nasopharyngeal  Nasopharyngeal Swab     Status: None   Collection Time: 01/15/20  4:40 PM   Specimen: Nasopharyngeal Swab  Result Value Ref Range Status   SARS Coronavirus 2 NEGATIVE NEGATIVE Final    Comment: (NOTE) SARS-CoV-2 target nucleic acids are NOT DETECTED. The SARS-CoV-2 RNA is generally detectable in upper and lower respiratory specimens during the acute phase of infection. Negative results do not preclude SARS-CoV-2 infection, do not rule out co-infections with other pathogens, and should not be used as the sole basis for treatment or other patient management decisions. Negative results must be combined with clinical observations, patient history, and epidemiological information. The expected result is Negative. Fact Sheet for Patients: SugarRoll.be Fact Sheet for Healthcare Providers: https://www.woods-mathews.com/ This test is not yet approved or cleared by the [place] FDA and  has been authorized for detection and/or diagnosis of SARS-CoV-2 by FDA under an Emergency Use Authorization (EUA). This EUA will remain  in effect (meaning this test can be used) for the duration of the COVID-19 declaration under Section 56 4(b)(1) of the Act, 21 U.S.C. section 360bbb-3(b)(1), unless the authorization  is terminated or revoked sooner. Performed at [facility], [place] [address], [place], [place] [address]   MRSA PCR Screening     Status: None   Collection Time: 01/15/20  6:33 PM   Specimen: Nasopharyngeal  Result Value Ref Range Status   MRSA by PCR NEGATIVE NEGATIVE Final    Comment:        The GeneXpert MRSA Assay (FDA approved for NASAL specimens only), is one component of a comprehensive MRSA colonization surveillance program. It is not intended to diagnose MRSA infection nor to guide or monitor treatment for MRSA infections. Performed at [facility], [place] [address], [place], [place] [address]     Radiology Reports CT  Head Wo Contrast  Result Date: 01/15/2020 CLINICAL DATA:  Head trauma. Fell at home following a dizzy/syncopal episode last night. Found on the floor this morning. EXAM: CT HEAD WITHOUT CONTRAST CT CERVICAL SPINE WITHOUT CONTRAST TECHNIQUE: Multidetector CT imaging of the head and cervical spine was performed following the standard protocol without intravenous contrast. Multiplanar CT image reconstructions of the cervical spine were also generated. COMPARISON:  10/29/2017. FINDINGS: CT HEAD FINDINGS Brain: No evidence of acute infarction, hemorrhage, hydrocephalus, extra-axial collection or mass lesion/mass effect. There is ventricular and sulcal enlargement reflecting atrophy, advanced for age. Mild periventricular white matter hypoattenuation is noted consistent with chronic microvascular ischemic change. These findings are stable from the prior head CT. Vascular: No hyperdense vessel or unexpected calcification. Skull: Normal. Negative for fracture or focal lesion. Sinuses/Orbits: Globes and orbits are unremarkable. Mucous retention cyst in the left maxillary sinus. Sinuses otherwise clear. Other: None. CT CERVICAL SPINE FINDINGS Alignment: Normal. Skull base and vertebrae: No acute fracture. No primary bone lesion or focal pathologic process. Soft tissues and spinal canal: No prevertebral fluid or swelling. No visible canal hematoma. Disc levels: Moderate loss of disc height at C4-C5. Mild loss of disc height at C5-C6. Mild to moderate loss of disc height at C6-C7. Mild spondylotic disc bulging with endplate spurring noted at these levels. No convincing disc herniation. Upper chest: No acute findings. Significant centrilobular emphysema noted at the lung apices. Other: None. IMPRESSION: HEAD CT 1. No acute intracranial abnormalities. 2. Atrophy advanced for age. Mild chronic microvascular ischemic change. Stable appearance from the prior head CT. CERVICAL CT 1. No fracture or acute finding. Electronically  Signed   By: Lajean Manes M.D.   On: 01/15/2020 15:31   CT Cervical Spine Wo Contrast  Result Date: 01/15/2020 CLINICAL DATA:  Head trauma. Fell at home following a dizzy/syncopal episode last night. Found on the floor this morning. EXAM: CT HEAD WITHOUT CONTRAST CT CERVICAL SPINE WITHOUT CONTRAST TECHNIQUE: Multidetector CT imaging of the head and cervical spine was performed following the standard protocol without intravenous contrast. Multiplanar CT image reconstructions of the cervical spine were also generated. COMPARISON:  10/29/2017. FINDINGS: CT HEAD FINDINGS Brain: No evidence of acute infarction, hemorrhage, hydrocephalus, extra-axial collection or mass lesion/mass effect. There is ventricular and sulcal enlargement reflecting atrophy, advanced for age. Mild periventricular white matter hypoattenuation is noted consistent with chronic microvascular ischemic change. These findings are stable from the prior head CT. Vascular: No hyperdense vessel or unexpected calcification. Skull: Normal. Negative for fracture or focal lesion. Sinuses/Orbits: Globes and orbits are unremarkable. Mucous retention cyst in the left maxillary sinus. Sinuses otherwise clear. Other: None. CT CERVICAL SPINE FINDINGS Alignment: Normal. Skull base and vertebrae: No acute fracture. No primary bone lesion or focal pathologic process. Soft tissues and  spinal canal: No prevertebral fluid or swelling. No visible canal hematoma. Disc levels: Moderate loss of disc height at C4-C5. Mild loss of disc height at C5-C6. Mild to moderate loss of disc height at C6-C7. Mild spondylotic disc bulging with endplate spurring noted at these levels. No convincing disc herniation. Upper chest: No acute findings. Significant centrilobular emphysema noted at the lung apices. Other: None. IMPRESSION: HEAD CT 1. No acute intracranial abnormalities. 2. Atrophy advanced for age. Mild chronic microvascular ischemic change. Stable appearance from the prior  head CT. CERVICAL CT 1. No fracture or acute finding. Electronically Signed   By: Lajean Manes M.D.   On: 01/15/2020 15:31   US RENAL  Result Date: 01/15/2020 CLINICAL DATA:  Acute kidney injury. EXAM: RENAL / URINARY TRACT ULTRASOUND COMPLETE COMPARISON:  CT angiography of the chest abdomen pelvis 12/02/2016 FINDINGS: Right Kidney: Renal measurements: 11.4 x 4.9 x 5.5 cm = volume: 159 mL. Suggestion of mild increased renal echogenicity. Cyst in the lower pole measures 1.6 x 1.5 x 1.6 cm. No solid mass or hydronephrosis visualized. Left Kidney: Renal measurements: 11.4 x 5.2 x 4.3 cm = volume: 131 mL. Suggestion of increased renal echogenicity. No mass or hydronephrosis visualized. Bladder: Echogenic debris in the bladder which is layering dependently. No bladder wall thickening. Neither ureteral jet is visualized. Other: None. IMPRESSION: 1. Debris in the urinary bladder, recommend correlation with urinalysis. No bladder wall thickening. 2. Mild bilateral increased renal parenchymal echogenicity suggesting chronic medical renal disease. No hydronephrosis. 3. Simple cyst in the right kidney. Electronically Signed   By: Keith Rake M.D.   On: 01/15/2020 21:15   DG Chest Portable 1 View  Result Date: 01/15/2020 CLINICAL DATA:  Trauma secondary to a fall yesterday. EXAM: PORTABLE CHEST 1 VIEW COMPARISON:  11/11/2019 FINDINGS: The heart size and pulmonary vascularity are normal. There is slight haziness at the right lung base which could represent a small infiltrate or posterior fusion. A lateral view of the chest may be helpful. No pneumothorax. No acute bone abnormality. IMPRESSION: Possible small infiltrate or posterior effusion at the right lung base. Lateral view of the chest may be helpful for further evaluation. Electronically Signed   By: Lorriane Shire M.D.   On: 01/15/2020 14:37     Phillips Climes M.D on 01/18/2020 at 2:35 PM   After 7pm go to [email or web address] - password [facility]  [facility] -  Office  [phone]

## 2020-01-19 LAB — BASIC METABOLIC PANEL
Anion gap: 7 (ref 5–15)
BUN: 12 mg/dL (ref 6–20)
CO2: 22 mmol/L (ref 22–32)
Calcium: 9 mg/dL (ref 8.9–10.3)
Chloride: 110 mmol/L (ref 98–111)
Creatinine, Ser: 0.74 mg/dL (ref 0.61–1.24)
GFR calc Af Amer: >60 mL/min (ref >60)
GFR calc non Af Amer: >60 mL/min (ref >60)
Glucose, Bld: 87 mg/dL (ref 70–99)
Potassium: 3.9 mmol/L (ref 3.5–5.1)
Sodium: 139 mmol/L (ref 135–145)

## 2020-01-19 LAB — CBC
HCT: 37.4 % — ABNORMAL LOW (ref 39.0–52.0)
Hemoglobin: 13 g/dL (ref 13.0–17.0)
MCH: 34.3 pg — ABNORMAL HIGH (ref 26.0–34.0)
MCHC: 34.8 g/dL (ref 30.0–36.0)
MCV: 98.7 fL (ref 80.0–100.0)
Platelets: 84 10*3/uL — ABNORMAL LOW (ref 150–400)
RBC: 3.79 MIL/uL — ABNORMAL LOW (ref 4.22–5.81)
RDW: 12.1 % (ref 11.5–15.5)
WBC: 4.3 10*3/uL (ref 4.0–10.5)
nRBC: 0 % (ref 0.0–0.2)

## 2020-01-19 MED ORDER — AMLODIPINE BESYLATE 10 MG PO TABS: 10.0000 mg | ORAL_TABLET | Freq: Every day | ORAL | 0 refills | Status: DC

## 2020-01-19 MED ORDER — QUETIAPINE FUMARATE 400 MG PO TABS: 200.0000 mg | ORAL_TABLET | Freq: Every day | ORAL | Status: DC

## 2020-01-19 MED ORDER — AMLODIPINE BESYLATE 10 MG PO TABS: 10.0000 mg | ORAL_TABLET | Freq: Every day | ORAL | Status: DC

## 2020-01-19 MED ORDER — ENSURE ENLIVE PO LIQD: 237.0000 mL | Freq: Three times a day (TID) | ORAL | 12 refills | Status: DC

## 2020-01-19 MED ORDER — AMLODIPINE BESYLATE 5 MG PO TABS
5.0000 mg | ORAL_TABLET | Freq: Once | ORAL | Status: AC
  Administered 2020-01-19: 5 mg via ORAL
  Filled 2020-01-19: qty 1

## 2020-01-19 MED FILL — AMLODIPINE BESYLATE 10 MG T: 10 | 30 days supply | Qty: 30 | Fill #0

## 2020-01-19 NOTE — Discharge Instructions (Signed)
Follow with Primary MD Sonia Side., FNP in 7 days   Get CBC, CMP,  checked  by Primary MD next visit.    Activity: As tolerated with Full fall precautions use walker/cane & assistance as needed   Disposition Home    Diet: Heart Healthy  , with feeding assistance and aspiration precautions.   On your next visit with your primary care physician please Get Medicines reviewed and adjusted.   Please request your Prim.MD to go over all Hospital Tests and Procedure/Radiological results at the follow up, please get all Hospital records sent to your Prim MD by signing hospital release before you go home.   If you experience worsening of your admission symptoms, develop shortness of breath, life threatening emergency, suicidal or homicidal thoughts you must seek medical attention immediately by calling 911 or calling your MD immediately  if symptoms less severe.  You Must read complete instructions/literature along with all the possible adverse reactions/side effects for all the Medicines you take and that have been prescribed to you. Take any new Medicines after you have completely understood and accpet all the possible adverse reactions/side effects.   Do not drive, operating heavy machinery, perform activities at heights, swimming or participation in water activities or provide baby sitting services if your were admitted for syncope or siezures until you have seen by Primary MD or a Neurologist and advised to do so again.  Do not drive when taking Pain medications.    Do not take more than prescribed Pain, Sleep and Anxiety Medications  Special Instructions: If you have smoked or chewed Tobacco  in the last 2 yrs please stop smoking, stop any regular Alcohol  and or any Recreational drug use.  Wear Seat belts while driving.   Please note  You were cared for by a hospitalist during your hospital stay. If you have any questions about your discharge medications or the care you  received while you were in the hospital after you are discharged, you can call the unit and asked to speak with the hospitalist on call if the hospitalist that took care of you is not available. Once you are discharged, your primary care physician will handle any further medical issues. Please note that NO REFILLS for any discharge medications will be authorized once you are discharged, as it is imperative that you return to your primary care physician (or establish a relationship with a primary care physician if you do not have one) for your aftercare needs so that they can reassess your need for medications and monitor your lab values.  ==============================================================================================  Information on my medicine - ELIQUIS (apixaban)   Why was Eliquis prescribed for you? Eliquis was prescribed for blood clots that may have been found in the veins of your legs (deep vein thrombosis) or in your lungs (pulmonary embolism) in the past, and to reduce the risk of them occurring again.  What do You need to know about Eliquis ?  Your current dose is ONE 5 mg tablet taken TWICE daily.  Eliquis may be taken with or without food.   Try to take the dose about the same time in the morning and in the evening. If you have difficulty swallowing the tablet whole please discuss with your pharmacist how to take the medication safely.  Take Eliquis exactly as prescribed and DO NOT stop taking Eliquis without talking to the doctor who prescribed the medication.  Stopping may increase your risk of developing a new blood clot.  Refill your prescription before you run out.  After discharge, you should have regular check-up appointments with your healthcare provider that is prescribing your Eliquis.    What do you do if you miss a dose? If a dose of ELIQUIS is not taken at the scheduled time, take it as soon as possible on the same day and twice-daily administration  should be resumed. The dose should not be doubled to make up for a missed dose.  Important Safety Information A possible side effect of Eliquis is bleeding. You should call your healthcare provider right away if you experience any of the following: ? Bleeding from an injury or your nose that does not stop. ? Unusual colored urine (red or dark brown) or unusual colored stools (red or black). ? Unusual bruising for unknown reasons. ? A serious fall or if you hit your head (even if there is no bleeding).  Some medicines may interact with Eliquis and might increase your risk of bleeding or clotting while on Eliquis. To help avoid this, consult your healthcare provider or pharmacist prior to using any new prescription or non-prescription medications, including herbals, vitamins, non-steroidal anti-inflammatory drugs (NSAIDs) and supplements.  This website has more information on Eliquis (apixaban): http://www.eliquis.com/eliquis/home

## 2020-01-19 NOTE — Progress Notes (Signed)
Patient was discharged home with home health by MD order; discharged instructions review and give to patient with care notes; IV DIC; medicine was delivered to patient's room by pharmacy; patient will be escorted to the car by RN via wheelchair.

## 2020-01-19 NOTE — TOC Transition Note (Addendum)
Transition of Care [facility]) - CM/SW Discharge Note   Patient Details  Name: [name] Herring MRN: [id number] Date of Birth: 06-Mar-1961  [facility]) CM/SW Contact:  Maryclare Labrador, RN Phone Number: 01/19/2020, 11:31 AM   Clinical Narrative:   Pt deemed stable to discharge home today.  Pt now refusing SNF. - pt still does not have 24 hour supervision (attending aware).   Pt is interested in [facility].  CM offered medicare.gov HH list for choice - pt does not have a preference.  Alvis Lemmings accepts pt for [facility].  Pt aware that [facility] will be intermittent visits throughout the week (45 mins approximately once or twice a week).  Pt states he already has a RW in the home Discharge orders signed - CM signing off    Final next level of care: [place] Barriers to Discharge: Barriers Resolved   Patient Goals and CMS Choice   CMS Medicare.gov Compare Post Acute Care list provided to:: Patient Choice offered to / list presented to : Patient  Discharge Placement                       Discharge Plan and Services                          HH Arranged: RN, PT, OT, Social Work, Nurse's Aide HH Agency: [place] Date [facility] Agency Contacted: 01/19/20 Time [place]: [place] Representative spoke with at [place]: [place] Interventions     Readmission Risk Interventions No flowsheet data found.

## 2020-01-19 NOTE — Discharge Summary (Signed)
[name] Herring, is a 59 y.o. male  DOB 29-May-1961  MRN [id number].  Admission date:  01/15/2020  Admitting Physician  Albertine Patricia, MD  Discharge Date:  01/19/2020   Primary MD  Sonia Side., FNP  Recommendations for primary care physician for things to follow:  -Please check CBC, CMP during next visit -Please adjust antihypertensive regimen as needed, lisinopril has been changed to amlodipine.   Recommendation has been made for SNF placement, but patient is adamant about going home, have arranged for home health including PT/OT/visiting nurse and social worker, patient awake alert x3, appropriate, coherent, and he can make his own decisions, this has was discussed with his sister by phone yesterday as well, she was unable to convince him to go to a facility.  Admission Diagnosis  Dehydration [E86.0] AKI (acute kidney injury) ([place]) [N17.9] Near syncope [R55] Fall, initial encounter [W19.XXXA]   Discharge Diagnosis  Dehydration [E86.0] AKI (acute kidney injury) ([place]) [N17.9] Near syncope [R55] Fall, initial encounter [W19.XXXA]    Active Problems:   HIV (human immunodeficiency virus infection) ([place])   AKI (acute kidney injury) ([place])   History of pulmonary embolism          History of present illness and  Hospital Course:     Kindly see H&P for history of present illness and admission details, please review complete Labs, Consult reports and Test reports for all details in brief  HPI  from the history and physical done on the day of admission    Teon Bacheller  is a 59 y.o. male, with  medical history significant ofhypertension, depression, polysubstance abuse (tobacco, alcohol, cocaine), DVT, PE on Eliquis , HIV [name] ,schizophrenia, immunodeficiency disorder, was brought to ED secondary to fall, patient most recent ED visit was in February of this  year, secondary to hallucination, with some suicidal ideations, patient was admitted to old [place] psych facility, patient report he stayed there for almost 10 days, that he was discharged home after. -Patient presents to ED secondary to weakness and fall, patient reports overall he has been feeling weak over last week, and he has been having dizziness, lightheadedness over last week, reports is feeling like his legs gave out on him, because he was lightheaded, he fell on the ground, he stated he was too weak to wake up, he denies loss of consciousness, head trauma, neighbor to check on the patient, helped him to get up, and called 911, EMS reports patient was hypotensive systolic blood pressure in the 70s, and his blood sugar was in the 300s, and reports his appetite is good, he was trying to keep up with fluid drinking, but he has been having diarrhea for the last few days, he denies any chest pain, shortness of breath, coffee-ground emesis, nausea or vomiting, he does report dyspnea with activity secondary to generalized weakness. - in ED patient was hypotensive but he did respond to fluid bolus, Significant mainly for creatinine of 6.69, his baseline is around 1,  his BUN was 67, afebrile, his QTC at 544, T head with no acute findings, patient was started on fluid boluses, and [facility] hospitalist was called to admit.   Hospital Course     Acute kidney injury -Most likely ATN, from hypotension and low blood pressure, in the setting of using lisinopril, and volume depletion/dehydration. -No evidence of urine retention on physical exam. -Creatnine  was 6.7 on admission, has resolved with IV fluid, creatinine was within normal limits on discharge .improving with IV fluids. -Renal ultrasound with no evidence of hydronephrosis, but some evidence of chronic kidney disease. -Total CK within normal limit. -Cortisol level, TSH all within normal limit. -Now renal function back at baseline, blood pressure  has improved, lisinopril has been discontinued on discharge.  HIV -Patient report he has been compliant with his medications, BIKTARVY has been held on admission given creatinine clearance less than 30, now he is back on his meds given kidney function has improved.  Hypertension/hypotension -Patient with significant hypotension on presentation, this appears to be a clinical dehydration, cortisol and TSH within normal limit. -Hypotension has resolved,  blood pressure started to increase, he was started on Norvasc 10 mg oral daily instead of lisinopril given his AKI .   History of PE and DVT -He is on Eliquis, he has history of IVC filter, Eliquis initially held given acute renal failure, knee function has resolved patient was resumed on Eliquis.  Thrombocytopenia -It is improving, platelet count is 80 4K on discharge  SevereProtein calorie malnutrition -Continue supplement  Prolonged QTC -QTC is 544 on admission, has resolved, repeat EKG showing QTC of 440.  Schizophrenia and depression -He denies any suicidal thoughts or ideations currently -His QTC has improved, will resume on lower dose request, home dose Risperdal, and Ingrezza, he was resumed on Seroquel, but dose was lowered from 400 mg to 200 mg nightly. -I have stopped patient gabapentin and hydroxyzine as well, as they might contribute to increased confusion and low blood pressure.  Hypokalemia -Repleted     Discharge Condition:  -Patient is stable at time of discharge, but overall recommendation has been made for SNF, which he is adamantly declined, so he was discharged home with maximum home health can be provided PT/OT/RN/social worker.   Follow UP  Follow-up Information    Sonia Side., FNP Follow up in 1 week(s).   Specialty: Family Medicine Contact information: [place] [address] [phone]             Discharge Instructions  and  Discharge Medications     Discharge Instructions    Discharge instructions   Complete by: As directed    Follow with Primary MD Sonia Side., FNP in 7 days   Get CBC, CMP,  checked  by Primary MD next visit.    Activity: As tolerated with Full fall precautions use walker/cane & assistance as needed   Disposition Home    Diet: Heart Healthy  , with feeding assistance and aspiration precautions.   On your next visit with your primary care physician please Get Medicines reviewed and adjusted.   Please request your Prim.MD to go over all Hospital Tests and Procedure/Radiological results at the follow up, please get all Hospital records sent to your Prim MD by signing hospital release before you go home.   If you experience worsening of your admission symptoms, develop shortness of breath, life threatening emergency, suicidal or homicidal thoughts you must seek medical attention immediately by calling  911 or calling your MD immediately  if symptoms less severe.  You Must read complete instructions/literature along with all the possible adverse reactions/side effects for all the Medicines you take and that have been prescribed to you. Take any new Medicines after you have completely understood and accpet all the possible adverse reactions/side effects.   Do not drive, operating heavy machinery, perform activities at heights, swimming or participation in water activities or provide baby sitting services if your were admitted for syncope or siezures until you have seen by Primary MD or a Neurologist and advised to do so again.  Do not drive when taking Pain medications.    Do not take more than prescribed Pain, Sleep and Anxiety Medications  Special Instructions: If you have smoked or chewed Tobacco  in the last 2 yrs please stop smoking, stop any regular Alcohol  and or any Recreational drug use.  Wear Seat belts while driving.   Please note  You were cared for by a hospitalist during your  hospital stay. If you have any questions about your discharge medications or the care you received while you were in the hospital after you are discharged, you can call the unit and asked to speak with the hospitalist on call if the hospitalist that took care of you is not available. Once you are discharged, your primary care physician will handle any further medical issues. Please note that NO REFILLS for any discharge medications will be authorized once you are discharged, as it is imperative that you return to your primary care physician (or establish a relationship with a primary care physician if you do not have one) for your aftercare needs so that they can reassess your need for medications and monitor your lab values.   Increase activity slowly   Complete by: As directed      Allergies as of 01/19/2020      Reactions   Amoxicillin Rash      Medication List    STOP taking these medications   gabapentin 800 MG tablet Commonly known as: NEURONTIN   hydrOXYzine 50 MG tablet Commonly known as: ATARAX/VISTARIL   lisinopril 40 MG tablet Commonly known as: ZESTRIL     TAKE these medications   amLODipine 10 MG tablet Commonly known as: NORVASC Take 1 tablet (10 mg total) by mouth daily. Start taking on: January 20, 2020   benztropine 0.5 MG tablet Commonly known as: COGENTIN Take 0.5 mg by mouth 2 (two) times daily.   Biktarvy 50-200-25 MG Tabs tablet Generic drug: bictegravir-emtricitabine-tenofovir AF Take 1 tablet by mouth daily.   Eliquis 5 MG Tabs tablet Generic drug: apixaban Take 5 mg by mouth 2 (two) times daily.   feeding supplement (ENSURE ENLIVE) Liqd Take 237 mLs by mouth 3 (three) times daily between meals.   fluticasone 50 MCG/ACT nasal spray Commonly known as: FLONASE Place 1-2 sprays into both nostrils daily as needed for rhinitis.   Incruse Ellipta 62.5 MCG/INH Aepb Generic drug: umeclidinium bromide Inhale 1 puff into the lungs daily.   Ingrezza 80 MG  Caps Generic drug: Valbenazine Tosylate Take 1 capsule by mouth daily.   Melatonin 3-10 MG Tabs Take 1 tablet by mouth at bedtime.   mirtazapine 30 MG tablet Commonly known as: REMERON Take 30 mg by mouth at bedtime.   montelukast 10 MG tablet Commonly known as: SINGULAIR Take 10 mg by mouth daily.   naltrexone 50 MG tablet Commonly known as: DEPADE Take 50 mg by mouth daily.  pantoprazole 40 MG tablet Commonly known as: PROTONIX Take 40 mg by mouth daily.   QUEtiapine 400 MG tablet Commonly known as: SEROQUEL Take 0.5 tablets (200 mg total) by mouth at bedtime. What changed: how much to take   RisperDAL 1 MG tablet Generic drug: risperiDONE Take 1-2 mg by mouth See admin instructions. Take 1mg  in the morning, and 2mg  at bedtime   Spiriva HandiHaler 18 MCG inhalation capsule Generic drug: tiotropium Place 1 capsule into inhaler and inhale daily.   Symbicort 160-4.5 MCG/ACT inhaler Generic drug: budesonide-formoterol Inhale 2 puffs into the lungs in the morning and at bedtime.   tamsulosin 0.4 MG Caps capsule Commonly known as: FLOMAX Take 0.4 mg by mouth every evening.         Diet and Activity recommendation: See Discharge Instructions above   Consults obtained -  None   Major procedures and Radiology Reports - PLEASE review detailed and final reports for all details, in brief -     CT Head Wo Contrast  Result Date: 01/15/2020 CLINICAL DATA:  Head trauma. Fell at home following a dizzy/syncopal episode last night. Found on the floor this morning. EXAM: CT HEAD WITHOUT CONTRAST CT CERVICAL SPINE WITHOUT CONTRAST TECHNIQUE: Multidetector CT imaging of the head and cervical spine was performed following the standard protocol without intravenous contrast. Multiplanar CT image reconstructions of the cervical spine were also generated. COMPARISON:  10/29/2017. FINDINGS: CT HEAD FINDINGS Brain: No evidence of acute infarction, hemorrhage, hydrocephalus,  extra-axial collection or mass lesion/mass effect. There is ventricular and sulcal enlargement reflecting atrophy, advanced for age. Mild periventricular white matter hypoattenuation is noted consistent with chronic microvascular ischemic change. These findings are stable from the prior head CT. Vascular: No hyperdense vessel or unexpected calcification. Skull: Normal. Negative for fracture or focal lesion. Sinuses/Orbits: Globes and orbits are unremarkable. Mucous retention cyst in the left maxillary sinus. Sinuses otherwise clear. Other: None. CT CERVICAL SPINE FINDINGS Alignment: Normal. Skull base and vertebrae: No acute fracture. No primary bone lesion or focal pathologic process. Soft tissues and spinal canal: No prevertebral fluid or swelling. No visible canal hematoma. Disc levels: Moderate loss of disc height at C4-C5. Mild loss of disc height at C5-C6. Mild to moderate loss of disc height at C6-C7. Mild spondylotic disc bulging with endplate spurring noted at these levels. No convincing disc herniation. Upper chest: No acute findings. Significant centrilobular emphysema noted at the lung apices. Other: None. IMPRESSION: HEAD CT 1. No acute intracranial abnormalities. 2. Atrophy advanced for age. Mild chronic microvascular ischemic change. Stable appearance from the prior head CT. CERVICAL CT 1. No fracture or acute finding. Electronically Signed   By: Lajean Manes M.D.   On: 01/15/2020 15:31   CT Cervical Spine Wo Contrast  Result Date: 01/15/2020 CLINICAL DATA:  Head trauma. Fell at home following a dizzy/syncopal episode last night. Found on the floor this morning. EXAM: CT HEAD WITHOUT CONTRAST CT CERVICAL SPINE WITHOUT CONTRAST TECHNIQUE: Multidetector CT imaging of the head and cervical spine was performed following the standard protocol without intravenous contrast. Multiplanar CT image reconstructions of the cervical spine were also generated. COMPARISON:  10/29/2017. FINDINGS: CT HEAD FINDINGS  Brain: No evidence of acute infarction, hemorrhage, hydrocephalus, extra-axial collection or mass lesion/mass effect. There is ventricular and sulcal enlargement reflecting atrophy, advanced for age. Mild periventricular white matter hypoattenuation is noted consistent with chronic microvascular ischemic change. These findings are stable from the prior head CT. Vascular: No hyperdense vessel or unexpected calcification. Skull: Normal.  Negative for fracture or focal lesion. Sinuses/Orbits: Globes and orbits are unremarkable. Mucous retention cyst in the left maxillary sinus. Sinuses otherwise clear. Other: None. CT CERVICAL SPINE FINDINGS Alignment: Normal. Skull base and vertebrae: No acute fracture. No primary bone lesion or focal pathologic process. Soft tissues and spinal canal: No prevertebral fluid or swelling. No visible canal hematoma. Disc levels: Moderate loss of disc height at C4-C5. Mild loss of disc height at C5-C6. Mild to moderate loss of disc height at C6-C7. Mild spondylotic disc bulging with endplate spurring noted at these levels. No convincing disc herniation. Upper chest: No acute findings. Significant centrilobular emphysema noted at the lung apices. Other: None. IMPRESSION: HEAD CT 1. No acute intracranial abnormalities. 2. Atrophy advanced for age. Mild chronic microvascular ischemic change. Stable appearance from the prior head CT. CERVICAL CT 1. No fracture or acute finding. Electronically Signed   By: Lajean Manes M.D.   On: 01/15/2020 15:31   US RENAL  Result Date: 01/15/2020 CLINICAL DATA:  Acute kidney injury. EXAM: RENAL / URINARY TRACT ULTRASOUND COMPLETE COMPARISON:  CT angiography of the chest abdomen pelvis 12/02/2016 FINDINGS: Right Kidney: Renal measurements: 11.4 x 4.9 x 5.5 cm = volume: 159 mL. Suggestion of mild increased renal echogenicity. Cyst in the lower pole measures 1.6 x 1.5 x 1.6 cm. No solid mass or hydronephrosis visualized. Left Kidney: Renal measurements: 11.4  x 5.2 x 4.3 cm = volume: 131 mL. Suggestion of increased renal echogenicity. No mass or hydronephrosis visualized. Bladder: Echogenic debris in the bladder which is layering dependently. No bladder wall thickening. Neither ureteral jet is visualized. Other: None. IMPRESSION: 1. Debris in the urinary bladder, recommend correlation with urinalysis. No bladder wall thickening. 2. Mild bilateral increased renal parenchymal echogenicity suggesting chronic medical renal disease. No hydronephrosis. 3. Simple cyst in the right kidney. Electronically Signed   By: Keith Rake M.D.   On: 01/15/2020 21:15   DG Chest Portable 1 View  Result Date: 01/15/2020 CLINICAL DATA:  Trauma secondary to a fall yesterday. EXAM: PORTABLE CHEST 1 VIEW COMPARISON:  11/11/2019 FINDINGS: The heart size and pulmonary vascularity are normal. There is slight haziness at the right lung base which could represent a small infiltrate or posterior fusion. A lateral view of the chest may be helpful. No pneumothorax. No acute bone abnormality. IMPRESSION: Possible small infiltrate or posterior effusion at the right lung base. Lateral view of the chest may be helpful for further evaluation. Electronically Signed   By: Lorriane Shire M.D.   On: 01/15/2020 14:37    Micro Results     Recent Results (from the past 240 hour(s))  SARS CORONAVIRUS 2 (TAT 6-24 HRS) Nasopharyngeal Nasopharyngeal Swab     Status: None   Collection Time: 01/15/20  4:40 PM   Specimen: Nasopharyngeal Swab  Result Value Ref Range Status   SARS Coronavirus 2 NEGATIVE NEGATIVE Final    Comment: (NOTE) SARS-CoV-2 target nucleic acids are NOT DETECTED. The SARS-CoV-2 RNA is generally detectable in upper and lower respiratory specimens during the acute phase of infection. Negative results do not preclude SARS-CoV-2 infection, do not rule out co-infections with other pathogens, and should not be used as the sole basis for treatment or other patient management  decisions. Negative results must be combined with clinical observations, patient history, and epidemiological information. The expected result is Negative. Fact Sheet for Patients: SugarRoll.be Fact Sheet for Healthcare Providers: https://www.woods-mathews.com/ This test is not yet approved or cleared by the [place] FDA and  has been authorized for detection and/or diagnosis of SARS-CoV-2 by FDA under an Emergency Use Authorization (EUA). This EUA will remain  in effect (meaning this test can be used) for the duration of the COVID-19 declaration under Section 56 4(b)(1) of the Act, 21 U.S.C. section 360bbb-3(b)(1), unless the authorization is terminated or revoked sooner. Performed at [facility], [place] [address] [place], [place] [address]   MRSA PCR Screening     Status: None   Collection Time: 01/15/20  6:33 PM   Specimen: Nasopharyngeal  Result Value Ref Range Status   MRSA by PCR NEGATIVE NEGATIVE Final    Comment:        The GeneXpert MRSA Assay (FDA approved for NASAL specimens only), is one component of a comprehensive MRSA colonization surveillance program. It is not intended to diagnose MRSA infection nor to guide or monitor treatment for MRSA infections. Performed at [facility], [place] [address], [place], [place] [address]        Today   Subjective:   Dar Baile today has no headache,no chest or abdominal pain,no new weakness tingling or numbness, feels much better wants to go home today.   Objective:   Blood pressure (!) 161/99, pulse 60, temperature 97.6 F (36.4 C), temperature source Oral, resp. rate (!) 22, height 5\' 9"  (1.753 m), weight 63.5 kg, SpO2 98 %.   Intake/Output Summary (Last 24 hours) at 01/19/2020 1335 Last data filed at 01/19/2020 1201 Gross per 24 hour  Intake 191.25 ml  Output 2700 ml  Net -2508.75 ml    Exam Awake Alert, Oriented x 3, thin,  no new F.N deficits,  Normal affect Symmetrical Chest wall movement, Good air movement bilaterally, CTAB RRR,No Gallops,Rubs or new Murmurs, No Parasternal Heave +ve B.Sounds, Abd Soft, Non tender, No organomegaly appriciated, No rebound -guarding or rigidity. No Cyanosis, Clubbing or edema, No new Rash or bruise  Data Review   CBC w Diff:  Lab Results  Component Value Date   WBC 4.3 01/19/2020   HGB 13.0 01/19/2020   HCT 37.4 (L) 01/19/2020   PLT 84 (L) 01/19/2020   LYMPHOPCT 11 01/15/2020   MONOPCT 11 01/15/2020   EOSPCT 0 01/15/2020   BASOPCT 0 01/15/2020    CMP:  Lab Results  Component Value Date   NA 139 01/19/2020   K 3.9 01/19/2020   CL 110 01/19/2020   CO2 22 01/19/2020   BUN 12 01/19/2020   CREATININE 0.74 01/19/2020  .   Total Time in preparing paper work, data evaluation and todays exam - 71 minutes  Phillips Climes M.D on 01/19/2020 at 1:35 PM  [facility]   Office  [phone]
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
## 2020-01-21 ENCOUNTER — Emergency Department (HOSPITAL_COMMUNITY): Payer: Medicare Other

## 2020-01-21 ENCOUNTER — Encounter (HOSPITAL_COMMUNITY): Payer: Self-pay | Admitting: Emergency Medicine

## 2020-01-21 ENCOUNTER — Emergency Department (HOSPITAL_COMMUNITY)
Admission: EM | Admit: 2020-01-21 | Discharge: 2020-01-21 | Disposition: A | Discharge status: Home / Self Care | Payer: Medicare Other | Attending: Emergency Medicine | Admitting: Emergency Medicine

## 2020-01-21 DIAGNOSIS — Y939 Activity, unspecified: Secondary | ICD-10-CM | POA: Diagnosis not present

## 2020-01-21 DIAGNOSIS — Z85048 Personal history of other malignant neoplasm of rectum, rectosigmoid junction, and anus: Secondary | ICD-10-CM | POA: Insufficient documentation

## 2020-01-21 DIAGNOSIS — B20 Human immunodeficiency virus [HIV] disease: Secondary | ICD-10-CM | POA: Insufficient documentation

## 2020-01-21 DIAGNOSIS — Y929 Unspecified place or not applicable: Secondary | ICD-10-CM | POA: Diagnosis not present

## 2020-01-21 DIAGNOSIS — I10 Essential (primary) hypertension: Secondary | ICD-10-CM | POA: Diagnosis not present

## 2020-01-21 DIAGNOSIS — S32009A Unspecified fracture of unspecified lumbar vertebra, initial encounter for closed fracture: Secondary | ICD-10-CM | POA: Diagnosis not present

## 2020-01-21 DIAGNOSIS — W19XXXD Unspecified fall, subsequent encounter: Secondary | ICD-10-CM | POA: Diagnosis not present

## 2020-01-21 DIAGNOSIS — Y999 Unspecified external cause status: Secondary | ICD-10-CM | POA: Insufficient documentation

## 2020-01-21 DIAGNOSIS — Z20822 Contact with and (suspected) exposure to covid-19: Secondary | ICD-10-CM | POA: Diagnosis not present

## 2020-01-21 DIAGNOSIS — F17210 Nicotine dependence, cigarettes, uncomplicated: Secondary | ICD-10-CM | POA: Insufficient documentation

## 2020-01-21 DIAGNOSIS — S3992XA Unspecified injury of lower back, initial encounter: Secondary | ICD-10-CM | POA: Diagnosis present

## 2020-01-21 LAB — CBC
HCT: 42.6 % (ref 39.0–52.0)
Hemoglobin: 14.7 g/dL (ref 13.0–17.0)
MCH: 35.6 pg — ABNORMAL HIGH (ref 26.0–34.0)
MCHC: 34.5 g/dL (ref 30.0–36.0)
MCV: 103.1 fL — ABNORMAL HIGH (ref 80.0–100.0)
Platelets: 138 10*3/uL — ABNORMAL LOW (ref 150–400)
RBC: 4.13 MIL/uL — ABNORMAL LOW (ref 4.22–5.81)
RDW: 12.5 % (ref 11.5–15.5)
WBC: 6.7 10*3/uL (ref 4.0–10.5)
nRBC: 0 % (ref 0.0–0.2)

## 2020-01-21 LAB — RESPIRATORY PANEL BY RT PCR (FLU A&B, COVID)
Influenza A by PCR: NEGATIVE
Influenza B by PCR: NEGATIVE
SARS Coronavirus 2 by RT PCR: NEGATIVE

## 2020-01-21 LAB — BASIC METABOLIC PANEL
Anion gap: 10 (ref 5–15)
BUN: 24 mg/dL — ABNORMAL HIGH (ref 6–20)
CO2: 21 mmol/L — ABNORMAL LOW (ref 22–32)
Calcium: 9.4 mg/dL (ref 8.9–10.3)
Chloride: 108 mmol/L (ref 98–111)
Creatinine, Ser: 0.84 mg/dL (ref 0.61–1.24)
GFR calc Af Amer: >60 mL/min (ref >60)
GFR calc non Af Amer: >60 mL/min (ref >60)
Glucose, Bld: 73 mg/dL (ref 70–99)
Potassium: 3.8 mmol/L (ref 3.5–5.1)
Sodium: 139 mmol/L (ref 135–145)

## 2020-01-21 LAB — TYPE AND SCREEN
ABO/RH(D): A POS
Antibody Screen: NEGATIVE

## 2020-01-21 MED ORDER — MORPHINE SULFATE (PF) 4 MG/ML IV SOLN
4.0000 mg | Freq: Once | INTRAVENOUS | Status: AC
  Administered 2020-01-21: 4 mg via INTRAVENOUS
  Filled 2020-01-21: qty 1

## 2020-01-21 MED ORDER — LIDOCAINE 5 % EX PTCH
1.0000 | MEDICATED_PATCH | CUTANEOUS | Status: DC
  Administered 2020-01-21: 13:00:00 1 via TRANSDERMAL
  Filled 2020-01-21: qty 1

## 2020-01-21 MED ORDER — OXYCODONE-ACETAMINOPHEN 5-325 MG PO TABS: 1.0000 | ORAL_TABLET | Freq: Four times a day (QID) | ORAL | 0 refills | Status: DC | PRN

## 2020-01-21 MED ORDER — MORPHINE SULFATE (PF) 4 MG/ML IV SOLN
4.0000 mg | Freq: Once | INTRAVENOUS | Status: AC
  Administered 2020-01-21: 19:00:00 4 mg via INTRAVENOUS
  Filled 2020-01-21: qty 1

## 2020-01-21 NOTE — ED Notes (Signed)
Dr. Christella Noa repaged to [phone] Dr. Laverta [place] paged by Levada Dy

## 2020-01-21 NOTE — ED Notes (Signed)
Pt requesting pain medication, EDP made aware 

## 2020-01-21 NOTE — Discharge Instructions (Signed)
Lumbar Spine Fracture A lumbar spine fracture is a break in one of the bones of the lower back. Lumbar spine fractures can vary from mild to severe. The most severe types are those that:  Cause the broken bones to move out of place (unstable).  Injure or press on the spinal cord. During recovery, it is normal to have pain and stiffness in the lower back for weeks. What are the causes? This condition may be caused by:  A fall.  A car accident.  A gunshot wound.  A hard, direct hit to the back. What increases the risk? You are more likely to develop this condition if:  You are in a situation that could result in a fall or other violent injury.  You have a condition that causes weakness in the bones (osteoporosis). What are the signs or symptoms? The main symptom of this condition is severe pain in the lower back. If a fracture is complex or severe, there may also be:  A misshapen or swollen area on the lower back.  Limited ability to move an area of the lower back.  Inability to empty the bladder (urinary retention).  Loss of bowel or bladder control (incontinence).  Loss of strength or sensation in the legs, feet, and toes.  Inability to move (paralysis). How is this diagnosed? This condition is diagnosed based on:  A physical exam.  Symptoms and what happened just before they developed.  The results of imaging tests, such as an X-ray, CT scan, or MRI. If your nerves have been damaged, you may also have other tests to find out the extent of the damage. How is this treated? Treatment for this condition depends on how severe the injury is. Most fractures can be treated with:  A back brace.  Bed rest and activity restrictions.  Pain medicine.  Physical therapy. Fractures that are complex, involve multiple bones, or make the spine unstable may require surgery. Surgery is done:  To remove pressure from the nerves or spinal cord.  To stabilize the broken  pieces of bone. Follow these instructions at home: Medicines  Take over-the-counter and prescription medicines only as told by your health care provider.  Do not drive or use heavy machinery while taking prescription pain medicine.  If you are taking prescription pain medicine, take actions to prevent or treat constipation. Your health care provider may recommend that you: ? Drink enough fluid to keep your urine pale yellow. ? Eat foods that are high in fiber, such as fresh fruits and vegetables, whole grains, and beans. ? Limit foods that are high in fat and processed sugars, such as fried or sweet foods. ? Take an over-the-counter or prescription medicine for constipation. If you have a brace:  Wear the back brace as told by your health care provider. Remove it only as told by your health care provider.  Keep the brace clean.  If the brace is not waterproof: ? Do not let it get wet. ? Cover it with a watertight covering when you take a bath or a shower. Activity  Stay in bed (on bed rest) only as directed by your health care provider.  Do exercises to improve motion and strength in your back (physical therapy), if your health care provider tells you to do so.  Return to your normal activities as directed by your health care provider. Ask your health care provider what activities are safe for you. Managing pain, stiffness, and swelling   If directed, put  ice on the injured area: ? If you have a removable brace, remove it as told by your health care provider. ? Put ice in a plastic bag. ? Place a towel between your skin and the bag. ? Leave the ice on for 20 minutes, 2-3 times a day. General instructions  Do not use any products that contain nicotine or tobacco, such as cigarettes and e-cigarettes. These can delay healing after injury. If you need help quitting, ask your health care provider.  Do not drink alcohol. Alcohol can interfere with your treatment.  Keep all  follow-up visits as directed by your health care provider. This is important. ? Failing to follow up as recommended could result in permanent injury, disability, or long-lasting (chronic) pain. Contact a health care provider if:  You have a fever.  Your pain medicine is not helping.  Your pain does not get better over time.  You cannot return to your normal activities as planned or expected. Get help right away if:  You have difficulty breathing.  Your pain is very bad and it suddenly gets worse.  You have numbness, tingling, or weakness in any part of your body.  You are unable to empty your bladder.  You cannot control your bladder or bowels.  You are unable to move any body part (paralysis) that is below the level of your injury.  You vomit.  You have pain in your abdomen. Summary  A lumbar spine fracture is a break in one of the bones of the lower back.  The main symptom of this condition is severe pain in the lower back. If a fracture is complex, there may also be numbness, tingling, or paralysis in the legs.  Treatment depends on how severe the injury is. Most fractures can be treated with a back brace, bed rest and activity restrictions, pain medicine, and physical therapy.  Fractures that are complex, involve multiple bones, or make the spine unstable may require surgery. This information is not intended to replace advice given to you by your health care provider. Make sure you discuss any questions you have with your health care provider. Document Revised: 10/27/2017 Document Reviewed: 10/27/2017 Elsevier Patient Education  [place] when not in bed.

## 2020-01-21 NOTE — Consult Note (Signed)
Reason for Consult:lumbar compression fracture Referring Physician: ED  [name] Herring is an 59 y.o. male.  HPI: whom was admitted just recently and discharged on 4/26. Came to the ED today for lower back pain. Ct showed L2 fracture with posterior elements intact. He has been walking for the last 2 weeks after a fall last week  Past Medical History:  Diagnosis Date  . Depression   . HIV (human immunodeficiency virus infection) ([place])   . Hypertension   . Immune deficiency disorder ([place])   . PE (pulmonary thromboembolism) ([place]) 2018  . Personality disorder ([place])   . Schizophrenia [facility])     Past Surgical History:  Procedure Laterality Date  . COLONOSCOPY WITH PROPOFOL N/A 06/15/2017   Procedure: COLONOSCOPY WITH PROPOFOL;  Surgeon: Irene Shipper, MD;  Location: [facility] ENDOSCOPY;  Service: Endoscopy;  Laterality: N/A;  . ESOPHAGOGASTRODUODENOSCOPY (EGD) WITH PROPOFOL N/A 06/15/2017   Procedure: ESOPHAGOGASTRODUODENOSCOPY (EGD) WITH PROPOFOL;  Surgeon: Irene Shipper, MD;  Location: [facility] ENDOSCOPY;  Service: Endoscopy;  Laterality: N/A;  . IR GENERIC HISTORICAL  11/04/2016   IR IVC FILTER PLMT / S&I [facility] GUID/MOD SED 11/04/2016 Aletta Edouard, MD [facility]-INTERV RAD    Family History  Problem Relation Age of Onset  . CAD Mother   . Kidney disease Sister   . Lupus Brother   . Cancer Maternal Grandmother   . Stroke Paternal Grandmother     Social History:  reports that he has been smoking cigarettes. He has a 16.50 pack-year smoking history. He has never used smokeless tobacco. He reports previous alcohol use. He reports current drug use. Drug: Cocaine.  Allergies:  Allergies  Allergen Reactions  . Amoxicillin Shortness Of Breath and Rash    Has patient had a PCN reaction causing immediate rash, facial/tongue/throat swelling, [name] Has patient had a PCN reaction causing severe rash involving mucus membranes or skin necrosis: no Has patient had a PCN  reaction that required hospitalization: yes drs office visit Has patient had a PCN reaction occurring within the last 10 years: no If all of the above answers are "NO", then may proceed with Cephalosporin use.   . Latex Shortness Of Breath  . Abilify [Aripiprazole] Other (See Comments)    Double vision  . Amoxicillin Rash    Medications: I have reviewed the patient's current medications.  Results for orders placed or performed during the hospital encounter of 01/21/20 (from the past 48 hour(s))  CBC     Status: Abnormal   Collection Time: 01/21/20  2:52 PM  Result Value Ref Range   WBC 6.7 4.0 - 10.5 K/uL   RBC 4.13 (L) 4.22 - 5.81 MIL/uL   Hemoglobin 14.7 13.0 - 17.0 g/dL   HCT 42.6 39.0 - 52.0 %   MCV 103.1 (H) 80.0 - 100.0 fL   MCH 35.6 (H) 26.0 - 34.0 pg   MCHC 34.5 30.0 - 36.0 g/dL   RDW 12.5 11.5 - 15.5 %   Platelets 138 (L) 150 - 400 K/uL   nRBC 0.0 0.0 - 0.2 %    Comment: Performed at [facility], [place] [address] [place], [place] [address]  Type and screen [place]     Status: None   Collection Time: 01/21/20  2:52 PM  Result Value Ref Range   ABO/RH(D) A POS    Antibody Screen NEG    Sample Expiration      01/24/2020,2359 Performed at [facility],  [place] [address] [place], [place] [id number]   Basic metabolic panel     Status: Abnormal   Collection Time: 01/21/20  2:52 PM  Result Value Ref Range   Sodium 139 135 - 145 mmol/L   Potassium 3.8 3.5 - 5.1 mmol/L   Chloride 108 98 - 111 mmol/L   CO2 21 (L) 22 - 32 mmol/L   Glucose, Bld 73 70 - 99 mg/dL    Comment: Glucose reference range applies only to samples taken after fasting for at least 8 hours.   BUN 24 (H) 6 - 20 mg/dL   Creatinine, Ser 0.84 0.61 - 1.24 mg/dL   Calcium 9.4 8.9 - 10.3 mg/dL   GFR calc non Af Amer >60 >60 mL/min   GFR calc Af Amer >60 >60 mL/min   Anion gap 10 5 - 15    Comment: Performed at [facility], [place]  [address] [place], [place] [address]  Respiratory Panel by RT PCR (Flu A&B, Covid) - Nasopharyngeal Swab     Status: None   Collection Time: 01/21/20  2:52 PM   Specimen: Nasopharyngeal Swab  Result Value Ref Range   SARS Coronavirus 2 by RT PCR NEGATIVE NEGATIVE    Comment: (NOTE) SARS-CoV-2 target nucleic acids are NOT DETECTED. The SARS-CoV-2 RNA is generally detectable in upper respiratoy specimens during the acute phase of infection. The lowest concentration of SARS-CoV-2 viral copies this assay can detect is 131 copies/mL. A negative result does not preclude SARS-Cov-2 infection and should not be used as the sole basis for treatment or other patient management decisions. A negative result may occur with  improper specimen collection/handling, submission of specimen other than nasopharyngeal swab, presence of viral mutation(s) within the areas targeted by this assay, and inadequate number of viral copies (<131 copies/mL). A negative result must be combined with clinical observations, patient history, and epidemiological information. The expected result is Negative. Fact Sheet for Patients:  PinkCheek.be Fact Sheet for Healthcare Providers:  GravelBags.it This test is not yet ap proved or cleared by the [place] FDA and  has been authorized for detection and/or diagnosis of SARS-CoV-2 by FDA under an Emergency Use Authorization (EUA). This EUA will remain  in effect (meaning this test can be used) for the duration of the COVID-19 declaration under Section 564(b)(1) of the Act, 21 U.S.C. section 360bbb-3(b)(1), unless the authorization is terminated or revoked sooner.    Influenza A by PCR NEGATIVE NEGATIVE   Influenza B by PCR NEGATIVE NEGATIVE    Comment: (NOTE) The Xpert Xpress SARS-CoV-2/FLU/RSV assay is intended as an aid in  the diagnosis of influenza from Nasopharyngeal swab specimens and  should not be used  as a sole basis for treatment. Nasal washings and  aspirates are unacceptable for Xpert Xpress SARS-CoV-2/FLU/RSV  testing. Fact Sheet for Patients: PinkCheek.be Fact Sheet for Healthcare Providers: GravelBags.it This test is not yet approved or cleared by the [place] FDA and  has been authorized for detection and/or diagnosis of SARS-CoV-2 by  FDA under an Emergency Use Authorization (EUA). This EUA will remain  in effect (meaning this test can be used) for the duration of the  Covid-19 declaration under Section 564(b)(1) of the Act, 21  U.S.C. section 360bbb-3(b)(1), unless the authorization is  terminated or revoked. Performed at [facility], [place] [address] [place], [place] [address]     CT Lumbar Spine Wo Contrast  Result Date: 01/21/2020 CLINICAL DATA:  [place] 1 week ago. Persistent  back pain. EXAM: CT LUMBAR SPINE WITHOUT CONTRAST TECHNIQUE: Multidetector CT imaging of the lumbar spine was performed without intravenous contrast administration. Multiplanar CT image reconstructions were also generated. COMPARISON:  None. FINDINGS: Segmentation: There are five lumbar type vertebral bodies. The last full intervertebral disc space is labeled L5-S1. Alignment: Normal Vertebrae: There is a slightly complex fracture involving the L2 vertebral body. There are fractures of the superior and inferior endplates with a fracture in the sagittal plane but no involvement of the posterior aspect of vertebral body. The posterior cortex is intact and the pedicles and posterior elements are intact. This is likely somewhere between a compression fracture and a burst fracture. The other vertebral bodies are intact. No pars defects or pars fractures. Paraspinal and other soft tissues: Small amount of paraspinal hematoma at L2. There is age advanced atherosclerotic calcification involving the abdominal aorta and iliac arteries. An  IVC filter is noted. Disc levels: No lumbar disc protrusions, spinal or foraminal stenosis. IMPRESSION: 1. Slightly complex fracture involving the L2 vertebral body. Both endplates are involved and there is a sagittal component but the posterior cortex is intact and the pedicles and posterior elements are intact. 2. The other vertebral bodies are intact. 3. Age advanced atherosclerotic calcification involving the abdominal aorta and iliac arteries. 4. Aortic atherosclerosis. Aortic Atherosclerosis (ICD10-I70.0). Electronically Signed   By: Marijo Sanes M.D.   On: 01/21/2020 13:17    Review of Systems Blood pressure (!) 155/103, pulse 72, temperature 97.9 F (36.6 C), temperature source Oral, resp. rate 15, SpO2 100 %. Physical Exam  Assessment/Plan: Ok for bracing. Moving all extremities. Believe bracing will suffice. Will order pain medication and follow up.  Ashok Pall 01/21/2020, 7:26 PM

## 2020-01-21 NOTE — ED Notes (Signed)
[facility] called for transport. 

## 2020-01-21 NOTE — ED Notes (Signed)
Patient verbalizes understanding of discharge instructions. Opportunity for questioning and answers were provided. Armband removed by staff, pt discharged from ED ambulatory w/ fitted brace by self

## 2020-01-21 NOTE — Progress Notes (Signed)
Orthopedic Tech Progress Note Patient Details:  [name] Herring 1961-03-09 [id number]  Patient ID: [name] Herring, male   DOB: 1961/04/24, 59 y.o.   MRN: [id number] Called order into hanger  Karolee Stamps 01/21/2020, 8:11 PM

## 2020-01-21 NOTE — ED Provider Notes (Signed)
[facility] DEPT Provider Note   CSN: [id number] Arrival date & time: 01/21/20  1151     History Chief Complaint  Patient presents with  . Fall    [name] Herring is a 58 y.o. male presenting for evaluation of back pain.  Patient states he fell a week ago, and was admitted to the hospital.  He states since then, he has been having back pain, which is present mostly when he lays flat.  His pain improves when he is up and moving.  He denies numbness or tingling.  He had imaging of his head and neck which was negative.  When he is admitted the hospital, he was found to have an AKI, but this improved.  He has been taking Tylenol every 4 hours without improvement of symptoms.  He has not tried anything else including a muscle relaxer, ibuprofen, or narcotic pain medicine.  He denies fevers, chills, chest pain, shortness of breath, nausea, vomiting, loss of bowel or bladder control.   Visual history change in chart review.  Per chart review, patient has a history of HIV (well controlled as of 06/10/2019), hypertension, previous PE on blood thinners, personality disorder, schizophrenia  HPI     Past Medical History:  Diagnosis Date  . Depression   . HIV (human immunodeficiency virus infection) ([place])   . Hypertension   . Immune deficiency disorder ([place])   . PE (pulmonary thromboembolism) ([place]) 2018  . Personality disorder ([place])   . Schizophrenia [facility])     Patient Active Problem List   Diagnosis Date Noted  . HIV (human immunodeficiency virus infection) ([employer]) 01/15/2020  . AKI (acute kidney injury) ([place]) 01/15/2020  . History of pulmonary embolism 01/15/2020  . Genital warts 10/16/2019  . GERD with esophagitis 01/22/2019  . Protein C deficiency ([place]) 07/05/2017  . Benign neoplasm of transverse colon   . Emphysema lung ([place]) 11/20/2016  . DVT of lower extremity, bilateral ([place]) 11/04/2016  . Tobacco abuse 11/02/2016  . PE (pulmonary thromboembolism)  ([place]) 11/02/2016  . HTN (hypertension) 08/25/2015  . Alcohol use disorder, severe, in early remission ([place]) 12/27/2014  . Human immunodeficiency virus (HIV) disease ([place]) 11/09/2006  . CA IN SITU, RECTUM 11/09/2006  . Hypothyroidism 11/09/2006  . Paranoid schizophrenia ([place]) 11/09/2006  . DISORDER, BIPOLAR NOS 11/09/2006  . Depression 11/09/2006    Past Surgical History:  Procedure Laterality Date  . COLONOSCOPY WITH PROPOFOL N/A 06/15/2017   Procedure: COLONOSCOPY WITH PROPOFOL;  Surgeon: Irene Shipper, MD;  Location: [facility] ENDOSCOPY;  Service: Endoscopy;  Laterality: N/A;  . ESOPHAGOGASTRODUODENOSCOPY (EGD) WITH PROPOFOL N/A 06/15/2017   Procedure: ESOPHAGOGASTRODUODENOSCOPY (EGD) WITH PROPOFOL;  Surgeon: Irene Shipper, MD;  Location: [facility] ENDOSCOPY;  Service: Endoscopy;  Laterality: N/A;  . IR GENERIC HISTORICAL  11/04/2016   IR IVC FILTER PLMT / S&I Burke Keels GUID/MOD SED 11/04/2016 Aletta Edouard, MD [facility]-INTERV RAD       Family History  Problem Relation Age of Onset  . CAD Mother   . Kidney disease Sister   . Lupus Brother   . Cancer Maternal Grandmother   . Stroke Paternal Grandmother     Social History   Tobacco Use  . Smoking status: Current Every Day Smoker    Packs/day: 0.50    Years: 33.00    Pack years: 16.50    Types: Cigarettes    Last attempt to quit: 04/25/2019    Years since quitting: 0.7  . Smokeless tobacco: Never Used  Substance Use  Topics  . Alcohol use: Not Currently    Comment: quit 2020  . Drug use: Yes    Types: Cocaine    Comment: Patient denies use, but UDS + cocaine    Home Medications Prior to Admission medications   Medication Sig Start Date End Date Taking? Authorizing Provider  acetaminophen (TYLENOL) 500 MG tablet Take 1 tablet (500 mg total) by mouth every 6 (six) hours as needed for moderate pain. Patient taking differently: Take 1,000 mg by mouth every 6 (six) hours as needed for moderate pain.  11/07/16   Eugenie Filler, MD  amLODipine  (NORVASC) 10 MG tablet Take 1 tablet (10 mg total) by mouth daily. 01/20/20   Elgergawy, Silver Huguenin, MD  benztropine (COGENTIN) 0.5 MG tablet Take 0.5 mg by mouth 2 (two) times daily. 12/27/19   [provider]  benztropine (COGENTIN) 1 MG tablet 1 tab in the morning 2 tabs at night Patient taking differently: Take 1-2 mg by mouth See admin instructions. Take 1 tablet in the morning and take 2 tablets at night 11/10/16   [place] Caldron M, PA-C  bictegravir-emtricitabine-tenofovir AF (BIKTARVY) 50-200-25 MG TABS tablet TAKE ONE TABLET BY MOUTH DAILY. TRY TO TAKE AT THE SAME TIME EACH DAY WITH OR WITHOUT FOOD. Patient taking differently: Take 1 tablet by mouth daily.  06/10/19   Dixon, Melton Krebs, NP  BIKTARVY 50-200-25 MG TABS tablet Take 1 tablet by mouth daily. 01/01/20   [provider]  butalbital-acetaminophen-caffeine (ESGIC) 50-325-40 MG tablet Take 1 tablet by mouth 2 (two) times daily as needed for headache. 02/26/18   Newlin, Charlane Ferretti, MD  ELIQUIS 5 MG TABS tablet TAKE ONE TABLET BY MOUTH TWICE DAILY Patient taking differently: Take 5 mg by mouth 2 (two) times daily.  11/04/19   Charlott Rakes, MD  ELIQUIS 5 MG TABS tablet Take 5 mg by mouth 2 (two) times daily. 01/01/20   [provider]  feeding supplement, ENSURE ENLIVE, (ENSURE ENLIVE) LIQD Take 237 mLs by mouth 3 (three) times daily between meals. 01/19/20   Elgergawy, Silver Huguenin, MD  fluticasone (FLONASE) 50 MCG/ACT nasal spray Place 1 spray into both nostrils daily as needed for allergies.  10/03/19   [provider]  fluticasone (FLONASE) 50 MCG/ACT nasal spray Place 1-2 sprays into both nostrils daily as needed for rhinitis. 11/01/19   [provider]  gabapentin (NEURONTIN) 800 MG tablet Take 800 mg by mouth at bedtime. 10/03/19   [provider]  INCRUSE ELLIPTA 62.5 MCG/INH AEPB Inhale 1 puff into the lungs daily. 01/01/20   [provider]  INGREZZA 80 MG CAPS Take 1 capsule by mouth daily.  12/27/19   [provider]  lisinopril (ZESTRIL) 40 MG tablet Take 40 mg by mouth daily. 07/30/19   [provider]  magnesium oxide (MAG-OX) 400 (241.3 Mg) MG tablet Take 1 tablet (400 mg total) by mouth 2 (two) times daily. 11/07/16   Eugenie Filler, MD  Melatonin 3-10 MG TABS Take 1 tablet by mouth at bedtime. 06/25/19   [provider]  Melatonin 3-10 MG TABS Take 1 tablet by mouth at bedtime. 11/27/19   [provider]  metoprolol tartrate (LOPRESSOR) 25 MG tablet Take 1 tablet (25 mg total) by mouth 2 (two) times daily. 08/07/18   Charlott Rakes, MD  mirtazapine (REMERON) 30 MG tablet Take 1 tablet (30 mg total) by mouth at bedtime. 06/20/17   [place] Callas, NP  mirtazapine (REMERON) 30 MG tablet Take 30  mg by mouth at bedtime. 12/27/19   [provider]  montelukast (SINGULAIR) 10 MG tablet Take 10 mg by mouth at bedtime. 10/02/19   [provider]  montelukast (SINGULAIR) 10 MG tablet Take 10 mg by mouth daily. 10/02/19   [provider]  naltrexone (DEPADE) 50 MG tablet Take 25 mg by mouth daily. 08/22/19   [provider]  naltrexone (DEPADE) 50 MG tablet Take 50 mg by mouth daily. 12/27/19   [provider]  pantoprazole (PROTONIX) 40 MG tablet Take 40 mg by mouth daily.  05/29/19   [provider]  pantoprazole (PROTONIX) 40 MG tablet Take 40 mg by mouth daily. 01/01/20   [provider]  polyethylene glycol (MIRALAX / GLYCOLAX) packet Take 17 g by mouth daily as needed for moderate constipation.     [provider]  QUEtiapine (SEROQUEL) 400 MG tablet Take 400 mg by mouth at bedtime.  03/27/19   [provider]  QUEtiapine (SEROQUEL) 400 MG tablet Take 0.5 tablets (200 mg total) by mouth at bedtime. 01/19/20   Elgergawy, Silver Huguenin, MD  RISPERDAL 1 MG tablet Take 1-2 mg by mouth See admin instructions. Take 1mg  in the morning, and 2mg  at bedtime 01/13/20   [provider]   SPIRIVA HANDIHALER 18 MCG inhalation capsule Place 1 capsule into inhaler and inhale daily. 01/01/20   [provider]  SYMBICORT 160-4.5 MCG/ACT inhaler Inhale 2 puffs into the lungs 2 (two) times daily. 10/03/19   [provider]  SYMBICORT 160-4.5 MCG/ACT inhaler Inhale 2 puffs into the lungs in the morning and at bedtime. 10/03/19   [provider]  tamsulosin (FLOMAX) 0.4 MG CAPS capsule Take 1 capsule (0.4 mg total) by mouth daily after supper. 09/17/19   [place] Callas, NP  tamsulosin (FLOMAX) 0.4 MG CAPS capsule Take 0.4 mg by mouth every evening. 01/01/20   [provider]  tiotropium (SPIRIVA) 18 MCG inhalation capsule Place 1 capsule (18 mcg total) into inhaler and inhale daily. 10/24/19   Charlott Rakes, MD  traZODone (DESYREL) 50 MG tablet Take 1 tablet (50 mg total) by mouth at bedtime. 08/07/18   Charlott Rakes, MD  Valbenazine Tosylate (INGREZZA) 80 MG CAPS Take 80 mg by mouth 2 (two) times daily.     [provider]  cetirizine (ZYRTEC) 10 MG tablet Take 1 tablet (10 mg total) by mouth daily. Patient not taking: Reported on 10/22/2019 02/26/18 11/15/19  Charlott Rakes, MD  haloperidol (HALDOL) 10 MG tablet Take 0.5 tablets (5 mg total) by mouth 2 (two) times daily. Patient not taking: Reported on 10/22/2019 06/18/17 11/15/19  Domenic Polite, MD    Allergies    Amoxicillin, Latex, Abilify [aripiprazole], and Amoxicillin  Review of Systems   Review of Systems  Musculoskeletal: Positive for back pain.  Hematological: Bruises/bleeds easily.  All other systems reviewed and are negative.   Physical Exam Updated Vital Signs BP (!) 130/94   Pulse 98   Temp 98.2 F (36.8 C) (Oral)   Resp 18   SpO2 98%   Physical Exam Vitals and nursing note reviewed.  Constitutional:      General: He is not in acute distress.    Appearance: He is well-developed.     Comments: Appears nontoxic  HENT:     Head: Normocephalic and atraumatic.  Eyes:      Conjunctiva/sclera: Conjunctivae normal.     Pupils: Pupils are equal, round, and reactive to light.  Cardiovascular:  Rate and Rhythm: Normal rate and regular rhythm.  Pulmonary:     Effort: Pulmonary effort is normal. No respiratory distress.     Breath sounds: Normal breath sounds. No wheezing.  Abdominal:     General: Bowel sounds are normal. There is no distension.     Palpations: Abdomen is soft.     Tenderness: There is no abdominal tenderness.  Musculoskeletal:        General: Tenderness present. Normal range of motion.     Cervical back: Normal range of motion and neck supple.     Comments: Tenderness palpation of bilateral low back musculature.  Pain over midline lumbar spine.  No obvious step-offs.  No tenderness palpation elsewhere in the back.  Good distal sensation cap refill.  No saddle paresthesias.  Patient is ambulatory.  Skin:    General: Skin is warm and dry.  Neurological:     Mental Status: He is alert and oriented to person, place, and time.  Psychiatric:     Comments: Abnormal affect     ED Results / Procedures / Treatments   Labs (all labs ordered are listed, but only abnormal results are displayed) Labs Reviewed  RESPIRATORY PANEL BY RT PCR (FLU A&B, COVID)  CBC  BASIC METABOLIC PANEL  TYPE AND SCREEN    EKG None  Radiology CT Lumbar Spine Wo Contrast  Result Date: 01/21/2020 CLINICAL DATA:  [place] 1 week ago. Persistent back pain. EXAM: CT LUMBAR SPINE WITHOUT CONTRAST TECHNIQUE: Multidetector CT imaging of the lumbar spine was performed without intravenous contrast administration. Multiplanar CT image reconstructions were also generated. COMPARISON:  None. FINDINGS: Segmentation: There are five lumbar type vertebral bodies. The last full intervertebral disc space is labeled L5-S1. Alignment: Normal Vertebrae: There is a slightly complex fracture involving the L2 vertebral body. There are fractures of the superior and inferior endplates with a  fracture in the sagittal plane but no involvement of the posterior aspect of vertebral body. The posterior cortex is intact and the pedicles and posterior elements are intact. This is likely somewhere between a compression fracture and a burst fracture. The other vertebral bodies are intact. No pars defects or pars fractures. Paraspinal and other soft tissues: Small amount of paraspinal hematoma at L2. There is age advanced atherosclerotic calcification involving the abdominal aorta and iliac arteries. An IVC filter is noted. Disc levels: No lumbar disc protrusions, spinal or foraminal stenosis. IMPRESSION: 1. Slightly complex fracture involving the L2 vertebral body. Both endplates are involved and there is a sagittal component but the posterior cortex is intact and the pedicles and posterior elements are intact. 2. The other vertebral bodies are intact. 3. Age advanced atherosclerotic calcification involving the abdominal aorta and iliac arteries. 4. Aortic atherosclerosis. Aortic Atherosclerosis (ICD10-I70.0). Electronically Signed   By: Marijo Sanes M.D.   On: 01/21/2020 13:17    Procedures Procedures (including critical care time)  Medications Ordered in ED Medications  lidocaine (LIDODERM) 5 % 1 patch (1 patch Transdermal Patch Applied 01/21/20 1251)  morphine 4 MG/ML injection 4 mg (has no administration in time range)    ED Course  I have reviewed the triage vital signs and the nursing notes.  Pertinent labs & imaging results that were available during my care of the patient were reviewed by me and considered in my medical decision making (see chart for details).    MDM Rules/Calculators/A&P  Patient presented for evaluation of back pain that began after a fall last week.  On exam, patient has tenderness palpation of his midline back and bilateral back musculature.  Likely MSK pain.  However, considering trauma and back pain, will obtain CT for further evaluation.   Patient is neuro intact.  Lidoderm patch for pain.  CT shows L2 complex fracture of the vertebral body involving both endplates. No other injury noted.  Case discussed with attending, Dr. Kathrynn Humble agrees to plan.  Will consult with neurosurgery.  Discussed with Dr. Christella Noa from neurosurgery, who recommends ED to ED transfer to [facility] where he will evaluate the patient.  Discussed with Dr. Johnney Killian from [facility] ED, she accepts pt for transfer.   Discussed findings and plan with pt, who is agreeable.   Final Clinical Impression(s) / ED Diagnoses Final diagnoses:  Closed fracture of lumbar vertebral body ([place])  Fall, subsequent encounter    Rx / DC Orders ED Discharge Orders    None       Franchot Heidelberg, PA-C 01/21/20 [place], Ankit, MD 01/21/20 1542

## 2020-01-21 NOTE — ED Notes (Signed)
Patient refused to change into gown. 

## 2020-01-21 NOTE — ED Provider Notes (Signed)
Blood pressure (!) 155/103, pulse 72, temperature 97.9 F (36.6 C), temperature source Oral, resp. rate 15, SpO2 100 %.  In short, [name] Herring is a 59 y.o. male with a chief complaint of Fall .  Refer to the original H&P for additional details.  Patient presents to the emergency department from [facility] long for neurosurgery evaluation.  He reports back pain especially with lying flat since a fall last week which required hospital admission.  He was discharged on 4/26.  No fall since that time.  He denies any numbness or tingling.  On my exam the patient is neurovascularly intact in the lower extremities.  Have paged Dr. Christella Noa to make him aware the patient is here for evaluation.   CT L spine without contrast IMPRESSION:  1. Slightly complex fracture involving the L2 vertebral body. Both  endplates are involved and there is a sagittal component but the  posterior cortex is intact and the pedicles and posterior elements  are intact.  2. The other vertebral bodies are intact.  3. Age advanced atherosclerotic calcification involving the  abdominal aorta and iliac arteries.  4. Aortic atherosclerosis.    Aortic Atherosclerosis (ICD10-I70.0).      Electronically Signed  By: Marijo Sanes M.D.  On: 01/21/2020 13:17   Spoke with Dr. Christella Noa who requests patient be placed in a aspen lumbar brace with thoracic extension. NSG will follow with him in the office next week. Called ortho tech who will order and place on the patient. Updated patient. He is clear to eat and have placed order.   Percocet Rx given after review of [place] drug database.     Margette Fast, MD 01/22/20 [phone]

## 2020-01-21 NOTE — ED Notes (Signed)
Pt arrives with [facility] from [facility] for neurosurgery consult. Pt fell 2 weeks ago and having continued back pain, pt has an L2 fracture. Pt ambulatory to transfer from EMS stretcher to Ed stretcher upon arrival. Pt a.o, VSS

## 2020-01-21 NOTE — ED Notes (Signed)
Pt given turkey sandwich and gingerale. 

## 2020-01-21 NOTE — ED Triage Notes (Signed)
Patient reports fall x1 week ago. C/o lower back pain since that time. States pain worsens when lying down. Ambulatory with EMS.

## 2020-02-25 ENCOUNTER — Other Ambulatory Visit: Payer: Self-pay

## 2020-02-25 DIAGNOSIS — Z91040 Latex allergy status: Secondary | ICD-10-CM | POA: Diagnosis not present

## 2020-02-25 DIAGNOSIS — I10 Essential (primary) hypertension: Secondary | ICD-10-CM | POA: Insufficient documentation

## 2020-02-25 DIAGNOSIS — F17210 Nicotine dependence, cigarettes, uncomplicated: Secondary | ICD-10-CM | POA: Insufficient documentation

## 2020-02-25 DIAGNOSIS — K219 Gastro-esophageal reflux disease without esophagitis: Secondary | ICD-10-CM | POA: Insufficient documentation

## 2020-02-25 DIAGNOSIS — Z79899 Other long term (current) drug therapy: Secondary | ICD-10-CM | POA: Diagnosis not present

## 2020-02-25 DIAGNOSIS — Z21 Asymptomatic human immunodeficiency virus [HIV] infection status: Secondary | ICD-10-CM | POA: Diagnosis not present

## 2020-02-25 DIAGNOSIS — Z7901 Long term (current) use of anticoagulants: Secondary | ICD-10-CM | POA: Insufficient documentation

## 2020-02-25 DIAGNOSIS — R0789 Other chest pain: Secondary | ICD-10-CM | POA: Diagnosis present

## 2020-02-25 NOTE — ED Triage Notes (Signed)
Arrived by EMS from home. Patient reports to EMS that he ate a large frozen Lasagna and has epigastric pain; denies nausea and vomiting.

## 2020-02-26 ENCOUNTER — Encounter (HOSPITAL_COMMUNITY): Payer: Self-pay

## 2020-02-26 ENCOUNTER — Emergency Department (HOSPITAL_COMMUNITY): Payer: Medicare Other

## 2020-02-26 ENCOUNTER — Emergency Department (HOSPITAL_COMMUNITY)
Admission: EM | Admit: 2020-02-26 | Discharge: 2020-02-26 | Disposition: A | Discharge status: Home / Self Care | Payer: Medicare Other | Attending: Emergency Medicine | Admitting: Emergency Medicine

## 2020-02-26 DIAGNOSIS — K219 Gastro-esophageal reflux disease without esophagitis: Secondary | ICD-10-CM

## 2020-02-26 LAB — COMPREHENSIVE METABOLIC PANEL
ALT: 14 U/L (ref 0–44)
AST: 17 U/L (ref 15–41)
Albumin: 4.3 g/dL (ref 3.5–5.0)
Alkaline Phosphatase: 74 U/L (ref 38–126)
Anion gap: 10 (ref 5–15)
BUN: 15 mg/dL (ref 6–20)
CO2: 23 mmol/L (ref 22–32)
Calcium: 9.6 mg/dL (ref 8.9–10.3)
Chloride: 104 mmol/L (ref 98–111)
Creatinine, Ser: 0.75 mg/dL (ref 0.61–1.24)
GFR calc Af Amer: >60 mL/min (ref >60)
GFR calc non Af Amer: >60 mL/min (ref >60)
Glucose, Bld: 89 mg/dL (ref 70–99)
Potassium: 4 mmol/L (ref 3.5–5.1)
Sodium: 137 mmol/L (ref 135–145)
Total Bilirubin: 1 mg/dL (ref 0.3–1.2)
Total Protein: 7.3 g/dL (ref 6.5–8.1)

## 2020-02-26 LAB — TROPONIN I (HIGH SENSITIVITY)
Troponin I (High Sensitivity): 2 ng/L (ref <18)
Troponin I (High Sensitivity): 3 ng/L (ref <18)

## 2020-02-26 LAB — BLOOD CULTURE ID PANEL (REFLEXED)

## 2020-02-26 LAB — CBC WITH DIFFERENTIAL/PLATELET
Abs Immature Granulocytes: 0 10*3/uL (ref 0.00–0.07)
Basophils Absolute: 0 10*3/uL (ref 0.0–0.1)
Basophils Relative: 1 %
Eosinophils Absolute: 0.1 10*3/uL (ref 0.0–0.5)
Eosinophils Relative: 1 %
HCT: 45 % (ref 39.0–52.0)
Hemoglobin: 15.2 g/dL (ref 13.0–17.0)
Immature Granulocytes: 0 %
Lymphocytes Relative: 32 %
Lymphs Abs: 1.4 10*3/uL (ref 0.7–4.0)
MCH: 34.1 pg — ABNORMAL HIGH (ref 26.0–34.0)
MCHC: 33.8 g/dL (ref 30.0–36.0)
MCV: 100.9 fL — ABNORMAL HIGH (ref 80.0–100.0)
Monocytes Absolute: 0.6 10*3/uL (ref 0.1–1.0)
Monocytes Relative: 12 %
Neutro Abs: 2.4 10*3/uL (ref 1.7–7.7)
Neutrophils Relative %: 54 %
Platelets: 186 10*3/uL (ref 150–400)
RBC: 4.46 MIL/uL (ref 4.22–5.81)
RDW: 14.1 % (ref 11.5–15.5)
WBC: 4.4 10*3/uL (ref 4.0–10.5)
nRBC: 0 % (ref 0.0–0.2)

## 2020-02-26 LAB — LIPASE, BLOOD: Lipase: 20 U/L (ref 11–51)

## 2020-02-26 LAB — LACTIC ACID, PLASMA: Lactic Acid, Venous: 0.9 mmol/L (ref 0.5–1.9)

## 2020-02-26 MED ORDER — ALUM & MAG HYDROXIDE-SIMETH 200-200-20 MG/5ML PO SUSP
30.0000 mL | Freq: Once | ORAL | Status: AC
  Administered 2020-02-26: 30 mL via ORAL
  Filled 2020-02-26: qty 30

## 2020-02-26 MED ORDER — SODIUM CHLORIDE 0.9 % IV SOLN
1.0000 g | Freq: Once | INTRAVENOUS | Status: AC
  Administered 2020-02-26: 1 g via INTRAVENOUS
  Filled 2020-02-26: qty 10

## 2020-02-26 MED ORDER — SODIUM CHLORIDE 0.9 % IV SOLN
500.0000 mg | Freq: Once | INTRAVENOUS | Status: AC
  Administered 2020-02-26: 500 mg via INTRAVENOUS
  Filled 2020-02-26: qty 500

## 2020-02-26 MED ORDER — SODIUM CHLORIDE (PF) 0.9 % IJ SOLN
INTRAMUSCULAR | Status: AC
  Filled 2020-02-26: qty 50

## 2020-02-26 MED ORDER — LIDOCAINE VISCOUS HCL 2 % MT SOLN
15.0000 mL | Freq: Once | OROMUCOSAL | Status: AC
  Administered 2020-02-26: 15 mL via ORAL
  Filled 2020-02-26: qty 15

## 2020-02-26 MED ORDER — FAMOTIDINE 20 MG PO TABS
40.0000 mg | ORAL_TABLET | Freq: Once | ORAL | Status: AC
  Administered 2020-02-26: 40 mg via ORAL
  Filled 2020-02-26: qty 2

## 2020-02-26 MED ORDER — IOHEXOL 350 MG/ML SOLN
100.0000 mL | Freq: Once | INTRAVENOUS | Status: AC | PRN
  Administered 2020-02-26: 100 mL via INTRAVENOUS

## 2020-02-26 NOTE — ED Notes (Signed)
PT transported to CT>

## 2020-02-26 NOTE — ED Provider Notes (Signed)
[facility] DEPT Provider Note   CSN: [id number] Arrival date & time: 02/25/20  2105     History Chief Complaint  Patient presents with  . Gastroesophageal Reflux    [name] Herring is a 59 y.o. male.  Patient presents to the emergency department for evaluation of chest and abdominal pain.  Patient reports that symptoms began tonight after eating a frozen lasagna.  He reports that he frequently gets problems with indigestion and takes antacid medication daily.  He is not feeling short of breath.  No nausea, vomiting, diaphoresis.        Past Medical History:  Diagnosis Date  . Depression   . HIV (human immunodeficiency virus infection) ([place])   . Hypertension   . Immune deficiency disorder ([place])   . PE (pulmonary thromboembolism) ([place]) 2018  . Personality disorder ([place])   . Schizophrenia [facility])     Patient Active Problem List   Diagnosis Date Noted  . HIV (human immunodeficiency virus infection) ([place]) 01/15/2020  . AKI (acute kidney injury) ([place]) 01/15/2020  . History of pulmonary embolism 01/15/2020  . Genital warts 10/16/2019  . GERD with esophagitis 01/22/2019  . Protein C deficiency ([place]) 07/05/2017  . Benign neoplasm of transverse colon   . Emphysema lung ([place]) 11/20/2016  . DVT of lower extremity, bilateral ([place]) 11/04/2016  . Tobacco abuse 11/02/2016  . PE (pulmonary thromboembolism) ([place]) 11/02/2016  . HTN (hypertension) 08/25/2015  . Alcohol use disorder, severe, in early remission ([place]) 12/27/2014  . Human immunodeficiency virus (HIV) disease ([place]) 11/09/2006  . CA IN SITU, RECTUM 11/09/2006  . Hypothyroidism 11/09/2006  . Paranoid schizophrenia ([place]) 11/09/2006  . DISORDER, BIPOLAR NOS 11/09/2006  . Depression 11/09/2006    Past Surgical History:  Procedure Laterality Date  . COLONOSCOPY WITH PROPOFOL N/A 06/15/2017   Procedure: COLONOSCOPY WITH PROPOFOL;  Surgeon: Irene Shipper, MD;  Location: [facility];  Service:  Endoscopy;  Laterality: N/A;  . ESOPHAGOGASTRODUODENOSCOPY (EGD) WITH PROPOFOL N/A 06/15/2017   Procedure: ESOPHAGOGASTRODUODENOSCOPY (EGD) WITH PROPOFOL;  Surgeon: Irene Shipper, MD;  Location: [facility];  Service: Endoscopy;  Laterality: N/A;  . IR GENERIC HISTORICAL  11/04/2016   IR IVC FILTER PLMT / S&I Burke Keels GUID/MOD SED 11/04/2016 Aletta Edouard, MD [facility]-INTERV RAD       Family History  Problem Relation Age of Onset  . CAD Mother   . Kidney disease Sister   . Lupus Brother   . Cancer Maternal Grandmother   . Stroke Paternal Grandmother     Social History   Tobacco Use  . Smoking status: Current Every Day Smoker    Packs/day: 0.50    Years: 33.00    Pack years: 16.50    Types: Cigarettes    Last attempt to quit: 04/25/2019    Years since quitting: 0.8  . Smokeless tobacco: Never Used  Substance Use Topics  . Alcohol use: Not Currently    Comment: quit 2020  . Drug use: Yes    Types: Cocaine    Comment: Patient denies use, but UDS + cocaine    Home Medications Prior to Admission medications   Medication Sig Start Date End Date Taking? Authorizing Provider  acetaminophen (TYLENOL) 500 MG tablet Take 1 tablet (500 mg total) by mouth every 6 (six) hours as needed for moderate pain. Patient taking differently: Take 1,000 mg by mouth every 6 (six) hours as needed for moderate pain.  11/07/16  Yes Eugenie Filler, MD  amLODipine (NORVASC) 10 MG  tablet Take 1 tablet (10 mg total) by mouth daily. 01/20/20  Yes Elgergawy, Silver Huguenin, MD  benztropine (COGENTIN) 0.5 MG tablet Take 0.5 mg by mouth 2 (two) times daily. 12/27/19  Yes [provider]  bictegravir-emtricitabine-tenofovir AF (BIKTARVY) 50-200-25 MG TABS tablet TAKE ONE TABLET BY MOUTH DAILY. TRY TO TAKE AT THE SAME TIME EACH DAY WITH OR WITHOUT FOOD. Patient taking differently: Take 1 tablet by mouth daily.  06/10/19  Yes Dixon, Melton Krebs, NP  ELIQUIS 5 MG TABS tablet TAKE ONE TABLET BY MOUTH TWICE DAILY Patient  taking differently: Take 5 mg by mouth 2 (two) times daily.  11/04/19  Yes Charlott Rakes, MD  feeding supplement, ENSURE ENLIVE, (ENSURE ENLIVE) LIQD Take 237 mLs by mouth 3 (three) times daily between meals. 01/19/20  Yes Elgergawy, Silver Huguenin, MD  fluticasone (FLONASE) 50 MCG/ACT nasal spray Place 1-2 sprays into both nostrils daily as needed for rhinitis. 11/01/19  Yes [provider]  gabapentin (NEURONTIN) 800 MG tablet Take 800 mg by mouth at bedtime. 10/03/19  Yes [provider]  INCRUSE ELLIPTA 62.5 MCG/INH AEPB Inhale 1 puff into the lungs daily. 01/01/20  Yes [provider]  lisinopril (ZESTRIL) 40 MG tablet Take 40 mg by mouth daily. 07/30/19  Yes [provider]  Melatonin 3-10 MG TABS Take 1 tablet by mouth at bedtime. 11/27/19  Yes [provider]  mirtazapine (REMERON) 30 MG tablet Take 30 mg by mouth at bedtime. 12/27/19  Yes [provider]  montelukast (SINGULAIR) 10 MG tablet Take 10 mg by mouth daily. 10/02/19  Yes [provider]  naltrexone (DEPADE) 50 MG tablet Take 50 mg by mouth daily. 12/27/19  Yes [provider]  oxyCODONE-acetaminophen (PERCOCET/ROXICET) 5-325 MG tablet Take 1 tablet by mouth every 6 (six) hours as needed for severe pain. 01/21/20  Yes Long, Wonda Olds, MD  pantoprazole (PROTONIX) 40 MG tablet Take 40 mg by mouth daily.  05/29/19  Yes [provider]  polyethylene glycol (MIRALAX / GLYCOLAX) packet Take 17 g by mouth daily as needed for moderate constipation.    Yes [provider]  QUEtiapine (SEROQUEL) 400 MG tablet Take 0.5 tablets (200 mg total) by mouth at bedtime. 01/19/20  Yes Elgergawy, Silver Huguenin, MD  RISPERDAL 1 MG tablet Take 1-2 mg by mouth See admin instructions. Take 1mg  in the morning, and 2mg  at bedtime 01/13/20  Yes [provider]  SYMBICORT 160-4.5 MCG/ACT inhaler Inhale 2 puffs into the lungs 2 (two) times daily. 10/03/19  Yes [provider]  tamsulosin  (FLOMAX) 0.4 MG CAPS capsule Take 1 capsule (0.4 mg total) by mouth daily after supper. 09/17/19  Yes LaGrange Callas, NP  tiotropium (SPIRIVA) 18 MCG inhalation capsule Place 1 capsule (18 mcg total) into inhaler and inhale daily. 10/24/19  Yes Charlott Rakes, MD  traZODone (DESYREL) 50 MG tablet Take 1 tablet (50 mg total) by mouth at bedtime. 08/07/18  Yes Charlott Rakes, MD  Valbenazine Tosylate (INGREZZA) 80 MG CAPS Take 80 mg by mouth 2 (two) times daily.    Yes [provider]  benztropine (COGENTIN) 1 MG tablet 1 tab in the morning 2 tabs at night Patient not taking: Reported on 02/26/2020 11/10/16   Argentina Donovan, PA-C  BIKTARVY 50-200-25 MG TABS tablet Take 1 tablet by mouth daily. 01/01/20   [provider]  butalbital-acetaminophen-caffeine (ESGIC) 50-325-40 MG tablet Take 1 tablet by mouth 2 (two) times daily as needed for headache. Patient not taking:  Reported on 02/26/2020 02/26/18   Charlott Rakes, MD  magnesium oxide (MAG-OX) 400 (241.3 Mg) MG tablet Take 1 tablet (400 mg total) by mouth 2 (two) times daily. Patient not taking: Reported on 02/26/2020 11/07/16   Eugenie Filler, MD  metoprolol tartrate (LOPRESSOR) 25 MG tablet Take 1 tablet (25 mg total) by mouth 2 (two) times daily. Patient not taking: Reported on 02/26/2020 08/07/18   Charlott Rakes, MD  cetirizine (ZYRTEC) 10 MG tablet Take 1 tablet (10 mg total) by mouth daily. Patient not taking: Reported on 10/22/2019 02/26/18 11/15/19  Charlott Rakes, MD  haloperidol (HALDOL) 10 MG tablet Take 0.5 tablets (5 mg total) by mouth 2 (two) times daily. Patient not taking: Reported on 10/22/2019 06/18/17 11/15/19  Domenic Polite, MD    Allergies    Amoxicillin, Latex, Abilify [aripiprazole], and Amoxicillin  Review of Systems   Review of Systems  Cardiovascular: Positive for chest pain.  Gastrointestinal: Positive for abdominal pain.  All other systems reviewed and are negative.   Physical Exam Updated Vital  Signs BP (!) 151/109   Pulse 73   Temp 98.7 F (37.1 C) (Oral)   Resp (!) 23   SpO2 95%   Physical Exam Vitals and nursing note reviewed.  Constitutional:      General: He is not in acute distress.    Appearance: Normal appearance. He is well-developed.  HENT:     Head: Normocephalic and atraumatic.     Right Ear: Hearing normal.     Left Ear: Hearing normal.     Nose: Nose normal.  Eyes:     Conjunctiva/sclera: Conjunctivae normal.     Pupils: Pupils are equal, round, and reactive to light.  Cardiovascular:     Rate and Rhythm: Regular rhythm.     Heart sounds: S1 normal and S2 normal. No murmur. No friction rub. No gallop.   Pulmonary:     Effort: Pulmonary effort is normal. No respiratory distress.     Breath sounds: Normal breath sounds.  Chest:     Chest wall: No tenderness.  Abdominal:     General: Bowel sounds are normal.     Palpations: Abdomen is soft.     Tenderness: There is no abdominal tenderness. There is no guarding or rebound. Negative signs include Murphy's sign and McBurney's sign.     Hernia: No hernia is present.  Musculoskeletal:        General: Normal range of motion.     Cervical back: Normal range of motion and neck supple.  Skin:    General: Skin is warm and dry.     Findings: No rash.  Neurological:     Mental Status: He is alert and oriented to person, place, and time.     GCS: GCS eye subscore is 4. GCS verbal subscore is 5. GCS motor subscore is 6.     Cranial Nerves: No cranial nerve deficit.     Sensory: No sensory deficit.     Coordination: Coordination normal.  Psychiatric:        Speech: Speech normal.        Behavior: Behavior normal.        Thought Content: Thought content normal.     ED Results / Procedures / Treatments   Labs (all labs ordered are listed, but only abnormal results are displayed) Labs Reviewed  CBC WITH DIFFERENTIAL/PLATELET - Abnormal; Notable for the following components:      Result Value   MCV 100.9  (*)  MCH 34.1 (*)    All other components within normal limits  CULTURE, BLOOD (ROUTINE X 2)  CULTURE, BLOOD (ROUTINE X 2)  COMPREHENSIVE METABOLIC PANEL  LIPASE, BLOOD  LACTIC ACID, PLASMA  TROPONIN I (HIGH SENSITIVITY)  TROPONIN I (HIGH SENSITIVITY)    EKG EKG Interpretation  Date/Time:  Thursday February 26 2020 02:10:50 EDT Ventricular Rate:  66 PR Interval:    QRS Duration: 95 QT Interval:  453 QTC Calculation: 475 R Axis:   10 Text Interpretation: Sinus rhythm Normal ECG Confirmed by [name] [phone]) on 02/26/2020 2:12:28 AM   Radiology CT ANGIO CHEST PE W OR WO CONTRAST  Result Date: 02/26/2020 CLINICAL DATA:  Shortness of breath EXAM: CT ANGIOGRAPHY CHEST WITH CONTRAST TECHNIQUE: Multidetector CT imaging of the chest was performed using the standard protocol during bolus administration of intravenous contrast. Multiplanar CT image reconstructions and MIPs were obtained to evaluate the vascular anatomy. CONTRAST:  177mL OMNIPAQUE IOHEXOL 350 MG/ML SOLN COMPARISON:  None. FINDINGS: Cardiovascular: There is a optimal opacification of the pulmonary arteries. There is no central,segmental, or subsegmental filling defects within the pulmonary arteries. The heart is normal in size. No pericardial effusion or thickening. No evidence right heart strain. There is normal three-vessel brachiocephalic anatomy without proximal stenosis. Scattered mild aortic atherosclerosis. Coronary artery calcifications are seen. Mediastinum/Nodes: No hilar, mediastinal, or axillary adenopathy. Thyroid gland, trachea, and esophagus demonstrate no significant findings. Lungs/Pleura: Centrilobular emphysematous changes seen at lung apices. No airspace consolidation or pleural effusion. There is minimal bibasilar atelectasis. Upper Abdomen: No acute abnormalities present in the visualized portions of the upper abdomen. Musculoskeletal: No chest wall abnormality. No acute or significant osseous findings.  Review of the MIP images confirms the above findings. IMPRESSION: No central, segmental, or subsegmental pulmonary embolism. No other acute intrathoracic pathology to explain the patient's symptoms. Aortic Atherosclerosis (ICD10-I70.0). Emphysema (ICD10-J43.9). Electronically Signed   By: Prudencio Pair M.D.   On: 02/26/2020 06:09   DG Chest Port 1 View  Result Date: 02/26/2020 CLINICAL DATA:  Chest pain for 2 days EXAM: PORTABLE CHEST 1 VIEW COMPARISON:  11/11/2019 FINDINGS: Cardiac shadow is stable. The lungs are well aerated bilaterally. Patchy right basilar infiltrate is noted with associated small effusion. The left lung is clear. No bony abnormality is seen. IMPRESSION: Patchy right basilar infiltrate with associated small effusion. Electronically Signed   By: Inez Catalina M.D.   On: 02/26/2020 01:26    Procedures Procedures (including critical care time)  Medications Ordered in ED Medications  sodium chloride (PF) 0.9 % injection (has no administration in time range)  famotidine (PEPCID) tablet 40 mg (40 mg Oral Given 02/26/20 0226)  alum & mag hydroxide-simeth (MAALOX/MYLANTA) 200-200-20 MG/5ML suspension 30 mL (30 mLs Oral Given 02/26/20 0227)    And  lidocaine (XYLOCAINE) 2 % viscous mouth solution 15 mL (15 mLs Oral Given 02/26/20 0227)  cefTRIAXone (ROCEPHIN) 1 g in sodium chloride 0.9 % 100 mL IVPB (0 g Intravenous Stopped 02/26/20 0439)  azithromycin (ZITHROMAX) 500 mg in sodium chloride 0.9 % 250 mL IVPB (0 mg Intravenous Stopped 02/26/20 0555)  iohexol (OMNIPAQUE) 350 MG/ML injection 100 mL (100 mLs Intravenous Contrast Given 02/26/20 0554)    ED Course  I have reviewed the triage vital signs and the nursing notes.  Pertinent labs & imaging results that were available during my care of the patient were reviewed by me and considered in my medical decision making (see chart for details).    MDM Rules/Calculators/A&P  Patient presented to the emergency department with  complaints of chest pain.  Patient reported pain across his anterior chest after he ate lasagna.  He does report that he has a history of reflux and has had similar complaints in the past.  Cardiac evaluation has not revealed any abnormality.  Reviewing his records did reveal a history of PE.  He had a haziness at the base of the right lung that was suspicious for pneumonia.  CT angiography was performed to further evaluate this for possible infarct and PE.  There is no PE noted.  There is also no evidence of pneumonia, x-ray findings were atelectasis.  Patient has had resolution of his pain after administration of Pepcid and GI cocktail.  He is appropriate for discharge.  Final Clinical Impression(s) / ED Diagnoses Final diagnoses:  Gastroesophageal reflux disease, unspecified whether esophagitis present    Rx / DC Orders ED Discharge Orders    None       Takyra Cantrall, Gwenyth Allegra, MD 02/26/20 [phone]

## 2020-02-26 NOTE — ED Notes (Signed)
Pt ambulated to restroom and back without asstiance with even steady gate.

## 2020-02-27 ENCOUNTER — Telehealth (HOSPITAL_BASED_OUTPATIENT_CLINIC_OR_DEPARTMENT_OTHER): Payer: Self-pay | Admitting: Emergency Medicine

## 2020-02-27 LAB — CULTURE, BLOOD (ROUTINE X 2): Special Requests: ADEQUATE

## 2020-02-29 ENCOUNTER — Telehealth: Payer: Self-pay | Admitting: Emergency Medicine

## 2020-02-29 NOTE — Telephone Encounter (Signed)
Post ED Visit - Positive Culture Follow-up  Culture report reviewed by antimicrobial stewardship pharmacist: [facility] Team []  Elenor Quinones, Pharm.D. []  Heide Guile, Pharm.D., BCPS AQ-ID []  Parks Neptune, Pharm.D., BCPS []  Alycia Rossetti, Pharm.D., BCPS []  [place], [place].D., BCPS, AAHIVP []  Legrand Como, Pharm.D., BCPS, AAHIVP []  Salome Arnt, PharmD, BCPS []  Johnnette Gourd, PharmD, BCPS []  Hughes Better, PharmD, BCPS []  Leeroy Cha, PharmD []  Laqueta Linden, PharmD, BCPS []  Albertina Parr, PharmD  [facility] Team []  Leodis Sias, PharmD []  Lindell Spar, PharmD []  Royetta Asal, PharmD [x]  Graylin Shiver, Rph []  Rema Fendt) Glennon Mac, PharmD []  Arlyn Dunning, PharmD []  Netta Cedars, PharmD []  Dia Sitter, PharmD []  Leone Haven, PharmD []  Gretta Arab, PharmD []  Theodis Shove, PharmD []  Peggyann Juba, PharmD []  Reuel Boom, PharmD   Positive Blood culture No further patient follow-up is required at this time.  Sandi Raveling Rihanna Marseille 02/29/2020, 12:38 PM

## 2020-03-02 LAB — CULTURE, BLOOD (ROUTINE X 2)
Culture: NO GROWTH
Special Requests: ADEQUATE

## 2020-03-23 ENCOUNTER — Other Ambulatory Visit: Payer: Self-pay

## 2020-03-23 DIAGNOSIS — R339 Retention of urine, unspecified: Secondary | ICD-10-CM

## 2020-03-23 MED ORDER — TAMSULOSIN HCL 0.4 MG PO CAPS: 0.4000 mg | ORAL_CAPSULE | Freq: Every day | ORAL | 0 refills | Status: DC

## 2020-03-25 ENCOUNTER — Other Ambulatory Visit: Payer: Self-pay | Admitting: Family Medicine

## 2020-04-19 ENCOUNTER — Other Ambulatory Visit: Payer: Self-pay | Admitting: Family Medicine

## 2020-04-19 DIAGNOSIS — J438 Other emphysema: Secondary | ICD-10-CM

## 2020-04-20 ENCOUNTER — Telehealth: Payer: Self-pay | Admitting: *Deleted

## 2020-04-20 NOTE — Telephone Encounter (Addendum)
Spoke with [name] Herring today. He denies any needs at this time. He is thankful for the care that he received at [facility] rehab and he now has a ACT team. The ACT team has delivered him food and makes sure he receives his medications.  Patient is aware of his upcoming appointment on 08/04 at 2:45, declined transportation and stated he is fine with catching the bus. [name] Herring is proud to report that he has gained 15lbs and is looking forward to seeing [name] NP [facility])  The intent of this communication is to inform the Health Care Team that this patient will be discharged from [facility]).  GOALS MET. Effective 04/14/2020 patient will be discharged and removed from [facility] active patient listing.

## 2020-04-28 ENCOUNTER — Ambulatory Visit: Payer: Medicare Other | Admitting: Infectious Diseases

## 2020-05-20 ENCOUNTER — Ambulatory Visit: Payer: Medicare Other | Admitting: Infectious Diseases

## 2020-05-21 ENCOUNTER — Ambulatory Visit (INDEPENDENT_AMBULATORY_CARE_PROVIDER_SITE_OTHER): Payer: Medicare Other | Admitting: Infectious Diseases

## 2020-05-21 ENCOUNTER — Other Ambulatory Visit: Payer: Self-pay

## 2020-05-21 ENCOUNTER — Encounter: Payer: Self-pay | Admitting: Infectious Diseases

## 2020-05-21 VITALS — BP 155/109 | HR 89 | Wt 131.0 lb

## 2020-05-21 DIAGNOSIS — F1021 Alcohol dependence, in remission: Secondary | ICD-10-CM

## 2020-05-21 DIAGNOSIS — F200 Paranoid schizophrenia: Secondary | ICD-10-CM | POA: Diagnosis not present

## 2020-05-21 DIAGNOSIS — I2699 Other pulmonary embolism without acute cor pulmonale: Secondary | ICD-10-CM | POA: Diagnosis not present

## 2020-05-21 DIAGNOSIS — B20 Human immunodeficiency virus [HIV] disease: Secondary | ICD-10-CM

## 2020-05-21 DIAGNOSIS — Z21 Asymptomatic human immunodeficiency virus [HIV] infection status: Secondary | ICD-10-CM

## 2020-05-21 LAB — T-HELPER CELL (CD4) - (RCID CLINIC ONLY)
CD4 % Helper T Cell: 27 % — ABNORMAL LOW (ref 33–65)
CD4 T Cell Abs: 256 /uL — ABNORMAL LOW (ref 400–1790)

## 2020-05-21 MED ORDER — BIKTARVY 50-200-25 MG PO TABS: 1.0000 | ORAL_TABLET | Freq: Every day | ORAL | 3 refills | Status: DC

## 2020-05-21 NOTE — Progress Notes (Signed)
Patient Name: [name] Herring  Date of Birth: 10-23-1960  MRN: [id number]  PCP: [name] Side., FNP    Patient Active Problem List   Diagnosis Date Noted  . AKI (acute kidney injury) ([place]) 01/15/2020  . History of pulmonary embolism 01/15/2020  . Genital warts 10/16/2019  . GERD with esophagitis 01/22/2019  . Protein C deficiency ([place]) 07/05/2017  . Benign neoplasm of transverse colon   . Emphysema lung ([place]) 11/20/2016  . DVT of lower extremity, bilateral ([place]) 11/04/2016  . Tobacco abuse 11/02/2016  . PE (pulmonary thromboembolism) ([place]) 11/02/2016  . HTN (hypertension) 08/25/2015  . Alcohol use disorder, severe, in early remission ([place]) 12/27/2014  . Human immunodeficiency virus (HIV) disease ([place]) 11/09/2006  . CA IN SITU, RECTUM 11/09/2006  . Hypothyroidism 11/09/2006  . Paranoid schizophrenia ([place]) 11/09/2006  . DISORDER, BIPOLAR NOS 11/09/2006  . Depression 11/09/2006    SUBJECTIVE:  Brief Narrative:  [name] Herring is a 59 y.o. AA male with a history of HIV disease Dx 08/18/1998. Previously followed at [facility].  HIV Risk: MSM.  History of OIs: uncertain. History of ETOH/cocaine abuse, previously homeless.   Previous Regimens:   Kaletra + Combivir   Genvoya > stopped d/t DDI with [facility]  Biktarvy 2019 >> suppressed    CC: [name] Herring tells me he has been doing pretty well since his last ER visit.  Alcohol use continues to be challenging for him.  Currently abstaining.  No other drug use describes.  He takes his Biktarvy every day without missed doses.  He states he has had no sexual partners but will except condoms today "just in case."  No concerns on his behalf today.  Eating and sleeping okay.  This is better when he is not drinking he states.  On and off antipsychotics. Still stays in close contact with his children.  The check in with him every day from what he says.    Review of Systems  Constitutional: Negative for chills and fever.  HENT:  Negative for tinnitus.   Eyes: Negative for blurred vision and photophobia.  Respiratory: Negative for cough and sputum production.   Cardiovascular: Negative for chest pain.  Gastrointestinal: Negative for diarrhea, nausea and vomiting.  Genitourinary: Negative for dysuria.  Skin: Negative for rash.  Neurological: Negative for headaches.     Past Medical History:  Diagnosis Date  . Depression   . HIV (human immunodeficiency virus infection) ([place])   . Hypertension   . Immune deficiency disorder ([place])   . PE (pulmonary thromboembolism) ([place]) 2018  . Personality disorder ([place])   . Schizophrenia ([place])    Outpatient Medications Prior to Visit  Medication Sig Dispense Refill  . metoprolol tartrate (LOPRESSOR) 25 MG tablet Take 1 tablet (25 mg total) by mouth 2 (two) times daily. 180 tablet 1  . bictegravir-emtricitabine-tenofovir AF (BIKTARVY) 50-200-25 MG TABS tablet TAKE ONE TABLET BY MOUTH DAILY. TRY TO TAKE AT THE SAME TIME EACH DAY WITH OR WITHOUT FOOD. (Patient taking differently: Take 1 tablet by mouth daily. ) 90 tablet 3  . acetaminophen (TYLENOL) 500 MG tablet Take 1 tablet (500 mg total) by mouth every 6 (six) hours as needed for moderate pain. (Patient taking differently: Take 1,000 mg by mouth every 6 (six) hours as needed for moderate pain. ) 30 tablet 0  . amLODipine (NORVASC) 10 MG tablet Take 1 tablet (10 mg total) by mouth daily. 30 tablet 0  . benztropine (COGENTIN) 0.5 MG tablet Take 0.5 mg  by mouth 2 (two) times daily.    . benztropine (COGENTIN) 1 MG tablet 1 tab in the morning 2 tabs at night (Patient not taking: Reported on 02/26/2020) 14 tablet 0  . butalbital-acetaminophen-caffeine (ESGIC) 50-325-40 MG tablet Take 1 tablet by mouth 2 (two) times daily as needed for headache. (Patient not taking: Reported on 02/26/2020) 30 tablet 0  . feeding supplement, ENSURE ENLIVE, (ENSURE ENLIVE) LIQD Take 237 mLs by mouth 3 (three) times daily between meals. 237 mL 12  .  fluticasone (FLONASE) 50 MCG/ACT nasal spray Place 1-2 sprays into both nostrils daily as needed for rhinitis.    Marland Kitchen gabapentin (NEURONTIN) 800 MG tablet Take 800 mg by mouth at bedtime.    . INCRUSE ELLIPTA 62.5 MCG/INH AEPB Inhale 1 puff into the lungs daily.    Marland Kitchen lisinopril (ZESTRIL) 40 MG tablet Take 40 mg by mouth daily.    . magnesium oxide (MAG-OX) 400 (241.3 Mg) MG tablet Take 1 tablet (400 mg total) by mouth 2 (two) times daily. (Patient not taking: Reported on 02/26/2020) 60 tablet 0  . Melatonin 3-10 MG TABS Take 1 tablet by mouth at bedtime.    . mirtazapine (REMERON) 30 MG tablet Take 30 mg by mouth at bedtime.    . montelukast (SINGULAIR) 10 MG tablet Take 10 mg by mouth daily.    . naltrexone (DEPADE) 50 MG tablet Take 50 mg by mouth daily.    Marland Kitchen oxyCODONE-acetaminophen (PERCOCET/ROXICET) 5-325 MG tablet Take 1 tablet by mouth every 6 (six) hours as needed for severe pain. 15 tablet 0  . pantoprazole (PROTONIX) 40 MG tablet Take 40 mg by mouth daily.     . polyethylene glycol (MIRALAX / GLYCOLAX) packet Take 17 g by mouth daily as needed for moderate constipation.     . QUEtiapine (SEROQUEL) 400 MG tablet Take 0.5 tablets (200 mg total) by mouth at bedtime.    Marland Kitchen RISPERDAL 1 MG tablet Take 1-2 mg by mouth See admin instructions. Take 1mg  in the morning, and 2mg  at bedtime    . SYMBICORT 160-4.5 MCG/ACT inhaler Inhale 2 puffs into the lungs 2 (two) times daily.    . tamsulosin (FLOMAX) 0.4 MG CAPS capsule Take 1 capsule (0.4 mg total) by mouth daily after supper. 30 capsule 0  . tiotropium (SPIRIVA) 18 MCG inhalation capsule Place 1 capsule (18 mcg total) into inhaler and inhale daily. 90 capsule 1  . traZODone (DESYREL) 50 MG tablet Take 1 tablet (50 mg total) by mouth at bedtime. 30 tablet 3  . Valbenazine Tosylate (INGREZZA) 80 MG CAPS Take 80 mg by mouth 2 (two) times daily.     Marland Kitchen BIKTARVY 50-200-25 MG TABS tablet Take 1 tablet by mouth daily.    Marland Kitchen ELIQUIS 5 MG TABS tablet TAKE ONE  TABLET BY MOUTH TWICE DAILY (Patient taking differently: Take 5 mg by mouth 2 (two) times daily. ) 180 tablet 1   No facility-administered medications prior to visit.   Allergies  Allergen Reactions  . Amoxicillin Shortness Of Breath and Rash    Has patient had a PCN reaction causing immediate rash, facial/tongue/throat swelling, [name] Has patient had a PCN reaction causing severe rash involving mucus membranes or skin necrosis: no Has patient had a PCN reaction that required hospitalization: yes drs office visit Has patient had a PCN reaction occurring within the last 10 years: no If all of the above answers are "NO", then may proceed with Cephalosporin use.   Marland Kitchen  Latex Shortness Of Breath  . Abilify [Aripiprazole] Other (See Comments)    Double vision  . Amoxicillin Rash   Social History   Tobacco Use  . Smoking status: Current Every Day Smoker    Packs/day: 0.50    Years: 33.00    Pack years: 16.50    Types: Cigarettes    Last attempt to quit: 04/25/2019    Years since quitting: 1.1  . Smokeless tobacco: Never Used  Vaping Use  . Vaping Use: Never used  Substance Use Topics  . Alcohol use: Not Currently    Comment: quit 2020  . Drug use: Yes    Types: Cocaine    Comment: Patient denies use, but UDS + cocaine   Objective:  Vitals:   05/21/20 1006  BP: (!) 155/109  Pulse: 89  Weight: 131 lb (59.4 kg)   Body mass index is 19.35 kg/m.  Physical Exam Constitutional:      Appearance: He is well-developed.     Comments: Thin appearing man.  Seated comfortably in the visit.  Pleasant.   HENT:     Mouth/Throat:     Dentition: Normal dentition. No dental abscesses.  Cardiovascular:     Rate and Rhythm: Normal rate and regular rhythm.     Heart sounds: Normal heart sounds.  Pulmonary:     Effort: Pulmonary effort is normal.     Breath sounds: Normal breath sounds.  Abdominal:     General: There is no distension.      Palpations: Abdomen is soft.     Tenderness: There is no abdominal tenderness.  Lymphadenopathy:     Cervical: No cervical adenopathy.  Skin:    General: Skin is warm and dry.     Findings: No rash.  Neurological:     Mental Status: He is alert and oriented to person, place, and time.  Psychiatric:        Judgment: Judgment normal.     Comments: In good spirits today and engaged in care discussion.       Lab Results Lab Results  Component Value Date   WBC 4.4 02/26/2020   HGB 15.2 02/26/2020   HCT 45.0 02/26/2020   MCV 100.9 (H) 02/26/2020   PLT 186 02/26/2020    Lab Results  Component Value Date   CREATININE 0.75 02/26/2020   BUN 15 02/26/2020   NA 137 02/26/2020   K 4.0 02/26/2020   CL 104 02/26/2020   CO2 23 02/26/2020    Lab Results  Component Value Date   ALT 14 02/26/2020   AST 17 02/26/2020   ALKPHOS 74 02/26/2020   BILITOT 1.0 02/26/2020    Lab Results  Component Value Date   CHOL 113 01/22/2019   HDL 56 01/22/2019   LDLCALC 40 01/22/2019   TRIG 86 01/22/2019   CHOLHDL 2.0 01/22/2019   HIV 1 RNA Quant  Date Value  05/21/2020 34 Copies/mL (H)  06/10/2019 <20 DETECTED copies/mL (A)  01/22/2019 <20 DETECTED copies/mL (A)   CD4 T Cell Abs (/uL)  Date Value  05/21/2020 256 (L)  01/22/2019 199 (L)  06/17/2018 250 (L)   Lab Results  Component Value Date   HAV REACTIVE (A) 06/17/2018   Lab Results  Component Value Date   HEPBSAG NON-REACTIVE 06/17/2018   HEPBSAB BORDERLINE (A) 06/17/2018    ASSESSMENT & PLAN:   Problem List Items Addressed This Visit      Unprioritized   PE (pulmonary thromboembolism) ([place])    Will need  to continue the Eliquis lifelong.  I will provide a refill for him today to bridge him until PCP appointment I told him his PCP would be the person to refill this medication long-term.  He needs to make an appointment to follow up on other chronic medical conditions.       Paranoid schizophrenia ([place])    Currently seems  stable, although had flare in the setting of suspected substance use within the last few months.  I am not certain who he sees for his psychiatric care.  Her home health nurse Luetta Nutting was working closely with him previously, however this position is not currently operational.  He states he is working with [facility] case Freight forwarder.       Human immunodeficiency virus (HIV) disease ([place]) (Chronic)    He is doing well on his Biktarvy.  He remains undetectable and is doing a great job taking this medication.  We will update pertinent lab work today.  I asked him to come back to see me in a month so he can follow-up on his alcohol use and make certain he scheduled a visit with his PCP.  Recommended flu vaccine at the end of October.  He has been fully vaccinated against COVID-19 recently.       Relevant Medications   bictegravir-emtricitabine-tenofovir AF (BIKTARVY) 50-200-25 MG TABS tablet   RESOLVED: HIV (human immunodeficiency virus infection) ([place]) - Primary   Relevant Medications   bictegravir-emtricitabine-tenofovir AF (BIKTARVY) 50-200-25 MG TABS tablet   Other Relevant Orders   HIV-1 RNA quant-no reflex-bld (Completed)   T-helper cell (CD4)- ([facility] clinic only) (Completed)      Janene Madeira, MSN, NP-C [facility] [email or web address] Pager: [phone] Office: [place]: [phone]    06/20/20

## 2020-05-21 NOTE — Patient Instructions (Addendum)
It was so nice to see you today!  Please continue your biktarvy once a day   Will stop by the lab on your way out   Please come back in 1 month to check in

## 2020-05-23 LAB — HIV-1 RNA QUANT-NO REFLEX-BLD
HIV 1 RNA Quant: 34 Copies/mL — ABNORMAL HIGH
HIV-1 RNA Quant, Log: 1.53 Log cps/mL — ABNORMAL HIGH

## 2020-06-14 ENCOUNTER — Telehealth: Payer: Self-pay | Admitting: *Deleted

## 2020-06-14 DIAGNOSIS — Z86718 Personal history of other venous thrombosis and embolism: Secondary | ICD-10-CM

## 2020-06-14 DIAGNOSIS — D6859 Other primary thrombophilia: Secondary | ICD-10-CM

## 2020-06-14 NOTE — Telephone Encounter (Signed)
He does need to continue this for life - I am OK if we refill x 1 but he is overdue for a visit with his PCP based on what I can recall.  Let's see if he can get an appointment with him soon for follow up on this and his other medical problems.   Thank you!

## 2020-06-14 NOTE — Telephone Encounter (Signed)
Will confirm upcoming pcp appointment with [name] Herring. Most recent prescription strength is 5mg  twice daily.  Was changed from 2.5 mg twice daily. Ok to fill 5 mg twice daily #60 with no refills?  Landis Gandy, RN

## 2020-06-14 NOTE — Telephone Encounter (Signed)
Yes that is correct - we had him on lower dose at first d/t drug interaction with Genvoya but now that he is on biktarvy no interaction.   5 mg BID dosing is correct.

## 2020-06-14 NOTE — Telephone Encounter (Signed)
Patient called to ask for eliquis refill.  He states that he needs to be on this for life.  His last refill was from old PCP at [facility].  He has switched PCP to [name], FNP at [facility], where he will see provider next month.  He has not asked [name] for Eliquis. Please advise. Landis Gandy, RN

## 2020-06-15 MED ORDER — APIXABAN 5 MG PO TABS: 5.0000 mg | ORAL_TABLET | Freq: Two times a day (BID) | ORAL | 0 refills | Status: AC

## 2020-06-15 NOTE — Addendum Note (Signed)
Addended by: Landis Gandy on: 06/15/2020 12:05 PM   Modules accepted: Orders

## 2020-06-15 NOTE — Telephone Encounter (Signed)
Sent eliquis to pharmacy, asked patient to discuss management with pcp.

## 2020-06-17 ENCOUNTER — Ambulatory Visit: Payer: Medicare Other | Admitting: Infectious Diseases

## 2020-06-20 NOTE — Assessment & Plan Note (Addendum)
Will need to continue the Eliquis lifelong.  I will provide a refill for him today to bridge him until PCP appointment I told him his PCP would be the person to refill this medication long-term.  He needs to make an appointment to follow up on other chronic medical conditions.

## 2020-06-20 NOTE — Assessment & Plan Note (Signed)
Currently seems stable, although had flare in the setting of suspected substance use within the last few months.  I am not certain who he sees for his psychiatric care.  Her home health nurse Luetta Nutting was working closely with him previously, however this position is not currently operational.  He states he is working with [facility] case Freight forwarder.

## 2020-06-20 NOTE — Assessment & Plan Note (Signed)
He is doing well on his Biktarvy.  He remains undetectable and is doing a great job taking this medication.  We will update pertinent lab work today.  I asked him to come back to see me in a month so he can follow-up on his alcohol use and make certain he scheduled a visit with his PCP.  Recommended flu vaccine at the end of October.  He has been fully vaccinated against COVID-19 recently.

## 2020-07-01 ENCOUNTER — Other Ambulatory Visit: Payer: Self-pay | Admitting: Family Medicine

## 2020-07-01 DIAGNOSIS — J438 Other emphysema: Secondary | ICD-10-CM

## 2020-07-01 NOTE — Telephone Encounter (Signed)
Requested medication (s) are due for refill today - unknown  Requested medication (s) are on the active medication list -yes  Future visit scheduled -yes  Last refill: 2001- 5 months ago  Notes to clinic: Patient request RF from historical provider- listed as duplicate  Requested Prescriptions  Pending Prescriptions Disp Refills   INCRUSE ELLIPTA 62.5 MCG/INH AEPB [Pharmacy Med Name: INCRUSE ELLIPTA 62.5 MCG INH] 30 each 11    Sig: Take one puff by mouth every day      Off-Protocol Failed - 07/01/2020  4:06 PM      Failed - Medication not assigned to a protocol, review manually.      Failed - Valid encounter within last 12 months    Recent Outpatient Visits           1 year ago Human immunodeficiency virus (HIV) disease ([place])   [facility] [place], Charlane Ferretti, MD   1 year ago Hypokalemia   [place], Enobong, MD   2 years ago Other headache syndrome   [place], Enobong, MD   2 years ago Paranoid schizophrenia, chronic condition [facility])   [facility] Charlott Rakes, MD   2 years ago Human immunodeficiency virus (HIV) disease [facility])   [place] Charlott Rakes, MD       Future Appointments             In 2 weeks Charlott Rakes, MD [place] 18 MCG inhalation capsule 30 capsule 0    Sig: Take one puff by mouth every day      Pulmonology:  Anticholinergic Agents Failed - 07/01/2020  4:06 PM      Failed - Valid encounter within last 12 months    Recent Outpatient Visits           1 year ago Human immunodeficiency virus (HIV) disease ([place])   [facility] [place], Charlane Ferretti, MD   1 year ago Hypokalemia   [place], Enobong, MD   2 years ago  Other headache syndrome   [place], Enobong, MD   2 years ago Paranoid schizophrenia, chronic condition ([place])   [place], Enobong, MD   2 years ago Human immunodeficiency virus (HIV) disease ([place])   [place] Charlott Rakes, MD       Future Appointments             In 2 weeks Charlott Rakes, MD [place]                Requested Prescriptions  Pending Prescriptions Disp Refills   INCRUSE ELLIPTA 62.5 MCG/INH AEPB [Pharmacy Med Name: INCRUSE ELLIPTA 62.5 MCG INH] 30 each 11    Sig: Take one puff by mouth every day      Off-Protocol Failed - 07/01/2020  4:06 PM      Failed - Medication not assigned to a protocol, review manually.      Failed - Valid encounter within last 12 months    Recent Outpatient Visits           1 year ago Human immunodeficiency virus (HIV) disease ([place])  [place] Charlott Rakes, MD   1 year ago Hypokalemia   [place], Enobong, MD   2 years ago Other headache syndrome   [place], Enobong, MD   2 years ago Paranoid schizophrenia, chronic condition [facility])   [facility] Charlott Rakes, MD   2 years ago Human immunodeficiency virus (HIV) disease [facility])   [place] Charlott Rakes, MD       Future Appointments             In 2 weeks Charlott Rakes, MD [place] 18 MCG inhalation capsule 30 capsule 0    Sig: Take one puff by mouth every day      Pulmonology:  Anticholinergic Agents Failed - 07/01/2020  4:06 PM      Failed - Valid encounter within last 12 months    Recent Outpatient Visits           1 year ago Human  immunodeficiency virus (HIV) disease ([place])    [facility] Charl[facility] Rakes, MD   1 year ago Hypokalemia   [place], Enobong, MD   2 years ago Other headache syndrome   [place], Enobong, MD   2 years ago Paranoid schizophrenia, chronic condition [facility])   [place], Enobong, MD   2 years ago Human immunodeficiency virus (HIV) disease ([place])   [place], Enobong, MD       Future Appointments             In 2 weeks Charlott Rakes, MD [place]

## 2020-07-01 NOTE — Telephone Encounter (Signed)
Call to patient- scheduled for visit 07/20/20. Rx filled #30 courtesy

## 2020-07-02 ENCOUNTER — Other Ambulatory Visit: Payer: Self-pay

## 2020-07-02 NOTE — Telephone Encounter (Signed)
Pharmacy calling ([facility] )regarding multiple request for refills .  They are calling to verify fax number.  A request for Eliquis was sent to [facility].   [facility] will be providing medications.  Pharmacy changed in EMR.   They will send faxes on medications that need refilled.   Laverle Patter, RN

## 2020-07-20 ENCOUNTER — Other Ambulatory Visit: Payer: Self-pay | Admitting: Family Medicine

## 2020-07-20 ENCOUNTER — Encounter: Payer: Medicare Other | Admitting: Family Medicine

## 2020-07-20 DIAGNOSIS — J438 Other emphysema: Secondary | ICD-10-CM

## 2020-07-20 NOTE — Telephone Encounter (Signed)
Requested medication (s) are due for refill today: yes  Requested medication (s) are on the active medication list: yes  Last refill:  07/07/20  Future visit scheduled: yes  Notes to clinic:  historical provider    Requested Prescriptions  Pending Prescriptions Disp Refills   SPIRIVA HANDIHALER 18 MCG inhalation capsule [Pharmacy Med Name: Spiriva 31mcg HandiHaler] 30 capsule 11    Sig: Inhale the contents of 1 capsule via handihaler every day      Pulmonology:  Anticholinergic Agents Failed - 07/20/2020  9:51 AM      Failed - Valid encounter within last 12 months    Recent Outpatient Visits           1 year ago Human immunodeficiency virus (HIV) disease ([place])   [facility] [place], Charlane Ferretti, MD   1 year ago Hypokalemia   [place], Charlane Ferretti, MD   2 years ago Other headache syndrome   [place], Charlane Ferretti, MD   2 years ago Paranoid schizophrenia, chronic condition ([place])   [place], Enobong, MD   2 years ago Human immunodeficiency virus (HIV) disease ([place])   [place], Charlane Ferretti, MD       Future Appointments             Today Charlott Rakes, MD [place]

## 2020-09-06 ENCOUNTER — Other Ambulatory Visit: Payer: Self-pay | Admitting: Family Medicine

## 2020-09-06 DIAGNOSIS — J438 Other emphysema: Secondary | ICD-10-CM

## 2020-09-06 NOTE — Telephone Encounter (Signed)
Patient called and advised he will need an appointment. Appointment scheduled fo Tuesday 10/26/20 at 1550 with Dr.Newlin. Advised refill request will be sent to the office.

## 2020-09-06 NOTE — Telephone Encounter (Signed)
Requested medication (s) are due for refill today: Yes  Requested medication (s) are on the active medication list: Yes  Last refill:  07/01/20  Future visit scheduled: Yes  Notes to clinic:  Unable to refill per protocol, last refilled by another provider     Requested Prescriptions  Pending Prescriptions Disp Refills   tiotropium (SPIRIVA HANDIHALER) 18 MCG inhalation capsule 30 capsule 0    Sig: Take one puff by mouth every day      Pulmonology:  Anticholinergic Agents Failed - 09/06/2020 10:55 AM      Failed - Valid encounter within last 12 months    Recent Outpatient Visits           1 year ago Human immunodeficiency virus (HIV) disease ([place])   [facility] [place], Charlane Ferretti, MD   2 years ago Hypokalemia   [place], Enobong, MD   2 years ago Other headache syndrome   [place], Charlane Ferretti, MD   2 years ago Paranoid schizophrenia, chronic condition ([place])   [place], Enobong, MD   3 years ago Human immunodeficiency virus (HIV) disease [facility])   [place], Charlane Ferretti, MD       Future Appointments             Tomorrow Doren Custard, Melton Krebs, NP [facility] for Infectious Disease, [facility]

## 2020-09-06 NOTE — Telephone Encounter (Signed)
Pharmacy requesting SPIRIVA HANDIHALER 18 MCG inhalation capsule , informed pharmacy please allow 48 to 72 hour turn around time. Pharmacy reached out to Dr. Tamala Julian office and received a denial and was advised to reach out to initial prescriber.     [name], [place] [address] Phone:  [phone]  Fax:  [phone]

## 2020-09-07 ENCOUNTER — Ambulatory Visit: Payer: Medicare Other | Admitting: Infectious Diseases

## 2020-09-08 MED ORDER — SPIRIVA HANDIHALER 18 MCG IN CAPS: ORAL_CAPSULE | RESPIRATORY_TRACT | 1 refills | Status: DC

## 2020-09-12 ENCOUNTER — Other Ambulatory Visit: Payer: Self-pay

## 2020-09-12 ENCOUNTER — Emergency Department (HOSPITAL_COMMUNITY): Payer: Medicare Other

## 2020-09-12 ENCOUNTER — Encounter (HOSPITAL_COMMUNITY): Payer: Self-pay | Admitting: Emergency Medicine

## 2020-09-12 ENCOUNTER — Emergency Department (HOSPITAL_COMMUNITY)
Admission: EM | Admit: 2020-09-12 | Discharge: 2020-09-12 | Disposition: A | Discharge status: Home / Self Care | Payer: Medicare Other | Attending: Emergency Medicine | Admitting: Emergency Medicine

## 2020-09-12 DIAGNOSIS — Z21 Asymptomatic human immunodeficiency virus [HIV] infection status: Secondary | ICD-10-CM | POA: Insufficient documentation

## 2020-09-12 DIAGNOSIS — Z79899 Other long term (current) drug therapy: Secondary | ICD-10-CM | POA: Diagnosis not present

## 2020-09-12 DIAGNOSIS — Z7901 Long term (current) use of anticoagulants: Secondary | ICD-10-CM | POA: Diagnosis not present

## 2020-09-12 DIAGNOSIS — I10 Essential (primary) hypertension: Secondary | ICD-10-CM | POA: Insufficient documentation

## 2020-09-12 DIAGNOSIS — F209 Schizophrenia, unspecified: Secondary | ICD-10-CM | POA: Insufficient documentation

## 2020-09-12 DIAGNOSIS — Z86718 Personal history of other venous thrombosis and embolism: Secondary | ICD-10-CM | POA: Diagnosis not present

## 2020-09-12 DIAGNOSIS — F17210 Nicotine dependence, cigarettes, uncomplicated: Secondary | ICD-10-CM | POA: Insufficient documentation

## 2020-09-12 DIAGNOSIS — Z91040 Latex allergy status: Secondary | ICD-10-CM | POA: Insufficient documentation

## 2020-09-12 DIAGNOSIS — Y902 Blood alcohol level of 40-59 mg/100 ml: Secondary | ICD-10-CM | POA: Insufficient documentation

## 2020-09-12 DIAGNOSIS — F10129 Alcohol abuse with intoxication, unspecified: Secondary | ICD-10-CM | POA: Diagnosis not present

## 2020-09-12 DIAGNOSIS — T50901A Poisoning by unspecified drugs, medicaments and biological substances, accidental (unintentional), initial encounter: Secondary | ICD-10-CM

## 2020-09-12 DIAGNOSIS — T450X1A Poisoning by antiallergic and antiemetic drugs, accidental (unintentional), initial encounter: Secondary | ICD-10-CM | POA: Insufficient documentation

## 2020-09-12 LAB — COMPREHENSIVE METABOLIC PANEL
ALT: 22 U/L (ref 0–44)
AST: 27 U/L (ref 15–41)
Albumin: 4.2 g/dL (ref 3.5–5.0)
Alkaline Phosphatase: 51 U/L (ref 38–126)
Anion gap: 14 (ref 5–15)
BUN: 19 mg/dL (ref 6–20)
CO2: 21 mmol/L — ABNORMAL LOW (ref 22–32)
Calcium: 9.1 mg/dL (ref 8.9–10.3)
Chloride: 107 mmol/L (ref 98–111)
Creatinine, Ser: 0.85 mg/dL (ref 0.61–1.24)
GFR, Estimated: >60 mL/min (ref >60)
Glucose, Bld: 76 mg/dL (ref 70–99)
Potassium: 3.5 mmol/L (ref 3.5–5.1)
Sodium: 142 mmol/L (ref 135–145)
Total Bilirubin: 0.7 mg/dL (ref 0.3–1.2)
Total Protein: 7.1 g/dL (ref 6.5–8.1)

## 2020-09-12 LAB — CBC WITH DIFFERENTIAL/PLATELET
Abs Immature Granulocytes: 0.02 10*3/uL (ref 0.00–0.07)
Basophils Absolute: 0 10*3/uL (ref 0.0–0.1)
Basophils Relative: 0 %
Eosinophils Absolute: 0 10*3/uL (ref 0.0–0.5)
Eosinophils Relative: 0 %
HCT: 47.2 % (ref 39.0–52.0)
Hemoglobin: 15.9 g/dL (ref 13.0–17.0)
Immature Granulocytes: 0 %
Lymphocytes Relative: 30 %
Lymphs Abs: 1.9 10*3/uL (ref 0.7–4.0)
MCH: 34 pg (ref 26.0–34.0)
MCHC: 33.7 g/dL (ref 30.0–36.0)
MCV: 101.1 fL — ABNORMAL HIGH (ref 80.0–100.0)
Monocytes Absolute: 0.7 10*3/uL (ref 0.1–1.0)
Monocytes Relative: 11 %
Neutro Abs: 3.6 10*3/uL (ref 1.7–7.7)
Neutrophils Relative %: 59 %
Platelets: 136 10*3/uL — ABNORMAL LOW (ref 150–400)
RBC: 4.67 MIL/uL (ref 4.22–5.81)
RDW: 16.7 % — ABNORMAL HIGH (ref 11.5–15.5)
WBC: 6.2 10*3/uL (ref 4.0–10.5)
nRBC: 0 % (ref 0.0–0.2)

## 2020-09-12 LAB — ETHANOL: Alcohol, Ethyl (B): 40 mg/dL — ABNORMAL HIGH (ref <10)

## 2020-09-12 LAB — SALICYLATE LEVEL: Salicylate Lvl: <7 mg/dL — ABNORMAL LOW (ref 7.0–30.0)

## 2020-09-12 LAB — ACETAMINOPHEN LEVEL: Acetaminophen (Tylenol), Serum: <10 ug/mL — ABNORMAL LOW (ref 10–30)

## 2020-09-12 MED ORDER — SODIUM CHLORIDE 0.9 % IV SOLN: INTRAVENOUS | Status: DC

## 2020-09-12 NOTE — Discharge Instructions (Signed)
Do not take any more sleep medication.  Follow-up with your for any further medication you to help you sleep.

## 2020-09-12 NOTE — ED Triage Notes (Signed)
Pt claims he has not slept in 3 days and took 17 Sleep Aid tablets throughout the day. Denies SI. States he "just wants to sleep". Admits to daily alcohol use as well.

## 2020-09-12 NOTE — ED Provider Notes (Signed)
[facility] DEPT Provider Note   CSN: [id number] Arrival date & time: 09/12/20  1902     History Chief Complaint  Patient presents with  . Drug Overdose    Pt took 17 Sleep Aid tablets throughout the day.    [name] Herring is a 59 y.o. male.  59 year old male with history of schizophrenia presents due to concern for taking too much diphenhydramine.  Patient states he took 17 tablets over the course of the day.  Denies this being a suicide attempt.  States he has a history of insomnia but has been out of his medications for that and schizophrenia.  Notes hearing more voices.  Denies any SI or HI.  States that he has been also drinking alcohol.  Denies any other ingestions.        Past Medical History:  Diagnosis Date  . Depression   . HIV (human immunodeficiency virus infection) ([place])   . Hypertension   . Immune deficiency disorder ([place])   . PE (pulmonary thromboembolism) ([place]) 2018  . Personality disorder ([place])   . Schizophrenia [facility])     Patient Active Problem List   Diagnosis Date Noted  . AKI (acute kidney injury) ([place]) 01/15/2020  . History of pulmonary embolism 01/15/2020  . Genital warts 10/16/2019  . GERD with esophagitis 01/22/2019  . Protein C deficiency ([place]) 07/05/2017  . Benign neoplasm of transverse colon   . Emphysema lung ([place]) 11/20/2016  . DVT of lower extremity, bilateral ([place]) 11/04/2016  . Tobacco abuse 11/02/2016  . PE (pulmonary thromboembolism) ([place]) 11/02/2016  . HTN (hypertension) 08/25/2015  . Alcohol use disorder, severe, in early remission ([place]) 12/27/2014  . Human immunodeficiency virus (HIV) disease ([place]) 11/09/2006  . CA IN SITU, RECTUM 11/09/2006  . Hypothyroidism 11/09/2006  . Paranoid schizophrenia ([place]) 11/09/2006  . DISORDER, BIPOLAR NOS 11/09/2006  . Depression 11/09/2006    Past Surgical History:  Procedure Laterality Date  . COLONOSCOPY WITH PROPOFOL N/A 06/15/2017   Procedure:  COLONOSCOPY WITH PROPOFOL;  Surgeon: Irene Shipper, MD;  Location: [facility] ENDOSCOPY;  Service: Endoscopy;  Laterality: N/A;  . ESOPHAGOGASTRODUODENOSCOPY (EGD) WITH PROPOFOL N/A 06/15/2017   Procedure: ESOPHAGOGASTRODUODENOSCOPY (EGD) WITH PROPOFOL;  Surgeon: Irene Shipper, MD;  Location: [facility] ENDOS[facility];  Service: Endoscopy;  Laterality: N/A;  . IR GENERIC HISTORICAL  11/04/2016   IR IVC FILTER PLMT / S&I Burke Keels GUID/MOD SED 11/04/2016 Aletta Edouard, MD [facility]-INTERV RAD       Family History  Problem Relation Age of Onset  . CAD Mother   . Kidney disease Sister   . Lupus Brother   . Cancer Maternal Grandmother   . Stroke Paternal Grandmother     Social History   Tobacco Use  . Smoking status: Current Every Day Smoker    Packs/day: 0.50    Years: 33.00    Pack years: 16.50    Types: Cigarettes    Last attempt to quit: 04/25/2019    Years since quitting: 1.3  . Smokeless tobacco: Never Used  Vaping Use  . Vaping Use: Never used  Substance Use Topics  . Alcohol use: Not Currently    Comment: quit 2020  . Drug use: Yes    Types: Cocaine    Comment: Patient denies use, but UDS + cocaine    Home Medications Prior to Admission medications   Medication Sig Start Date End Date Taking? Authorizing Provider  acetaminophen (TYLENOL) 500 MG tablet Take 1 tablet (500 mg total) by mouth every  6 (six) hours as needed for moderate pain. Patient taking differently: Take 1,000 mg by mouth every 6 (six) hours as needed for moderate pain.  11/07/16   Eugenie Filler, MD  amLODipine (NORVASC) 10 MG tablet Take 1 tablet (10 mg total) by mouth daily. 01/20/20   Elgergawy, Silver Huguenin, MD  apixaban (ELIQUIS) 5 MG TABS tablet Take 1 tablet (5 mg total) by mouth 2 (two) times daily. 06/15/20   Axis Callas, NP  benztropine (COGENTIN) 0.5 MG tablet Take 0.5 mg by mouth 2 (two) times daily. 12/27/19   [provider]  benztropine (COGENTIN) 1 MG tablet 1 tab in the morning 2 tabs at night Patient not  taking: Reported on 02/26/2020 11/10/16   Argentina Donovan, PA-C  bictegravir-emtricitabine-tenofovir AF (BIKTARVY) 50-200-25 MG TABS tablet Take 1 tablet by mouth daily. 05/21/20   [place] Callas, NP  butalbital-acetaminophen-caffeine (ESGIC) 50-325-40 MG tablet Take 1 tablet by mouth 2 (two) times daily as needed for headache. Patient not taking: Reported on 02/26/2020 02/26/18   Charlott Rakes, MD  feeding supplement, ENSURE ENLIVE, (ENSURE ENLIVE) LIQD Take 237 mLs by mouth 3 (three) times daily between meals. 01/19/20   Elgergawy, Silver Huguenin, MD  fluticasone (FLONASE) 50 MCG/ACT nasal spray Place 1-2 sprays into both nostrils daily as needed for rhinitis. 11/01/19   [provider]  gabapentin (NEURONTIN) 800 MG tablet Take 800 mg by mouth at bedtime. 10/03/19   [provider]  lisinopril (ZESTRIL) 40 MG tablet Take 40 mg by mouth daily. 07/30/19   [provider]  magnesium oxide (MAG-OX) 400 (241.3 Mg) MG tablet Take 1 tablet (400 mg total) by mouth 2 (two) times daily. Patient not taking: Reported on 02/26/2020 11/07/16   Eugenie Filler, MD  Melatonin 3-10 MG TABS Take 1 tablet by mouth at bedtime. 11/27/19   [provider]  metoprolol tartrate (LOPRESSOR) 25 MG tablet Take 1 tablet (25 mg total) by mouth 2 (two) times daily. 08/07/18   Charlott Rakes, MD  mirtazapine (REMERON) 30 MG tablet Take 30 mg by mouth at bedtime. 12/27/19   [provider]  montelukast (SINGULAIR) 10 MG tablet Take 10 mg by mouth daily. 10/02/19   [provider]  naltrexone (DEPADE) 50 MG tablet Take 50 mg by mouth daily. 12/27/19   [provider]  oxyCODONE-acetaminophen (PERCOCET/ROXICET) 5-325 MG tablet Take 1 tablet by mouth every 6 (six) hours as needed for severe pain. 01/21/20   Long, Wonda [place]s, MD  pantoprazole (PROTONIX) 40 MG tablet Take 40 mg by mouth daily.  05/29/19   [provider]  polyethylene glycol (MIRALAX / GLYCOLAX) packet Take 17 g by  mouth daily as needed for moderate constipation.     [provider]  QUEtiapine (SEROQUEL) 400 MG tablet Take 0.5 tablets (200 mg total) by mouth at bedtime. 01/19/20   Elgergawy, Silver Huguenin, MD  RISPERDAL 1 MG tablet Take 1-2 mg by mouth See admin instructions. Take 1mg  in the morning, and 2mg  at bedtime 01/13/20   [provider]  SYMBICORT 160-4.5 MCG/ACT inhaler Inhale 2 puffs into the lungs 2 (two) times daily. 10/03/19   [provider]  tamsulosin (FLOMAX) 0.4 MG CAPS capsule Take 1 capsule (0.4 mg total) by mouth daily after supper. 03/23/20   [place] Callas, NP  tiotropium (SPIRIVA HANDIHALER) 18 MCG inhalation capsule Take one puff by mouth every day 09/08/20   Charlott Rakes, MD  traZODone (DESYREL) 50 MG tablet Take 1  tablet (50 mg total) by mouth at bedtime. 08/07/18   Charlott Rakes, MD  Valbenazine Tosylate (INGREZZA) 80 MG CAPS Take 80 mg by mouth 2 (two) times daily.     [provider]  cetirizine (ZYRTEC) 10 MG tablet Take 1 tablet (10 mg total) by mouth daily. Patient not taking: Reported on 10/22/2019 02/26/18 11/15/19  Charlott Rakes, MD  haloperidol (HALDOL) 10 MG tablet Take 0.5 tablets (5 mg total) by mouth 2 (two) times daily. Patient not taking: Reported on 10/22/2019 06/18/17 11/15/19  Domenic Polite, MD    Allergies    Amoxicillin, Latex, Abilify [aripiprazole], and Amoxicillin  Review of Systems   Review of Systems  All other systems reviewed and are negative.   Physical Exam Updated Vital Signs BP (!) 141/101 (BP Location: Right Arm)   Pulse 68   Temp 98.2 F (36.8 C) (Oral)   Resp 15   Ht 1.753 m (5\' 9" )   Wt 59.4 kg   SpO2 100%   BMI 19.35 kg/m   Physical Exam Vitals and nursing note reviewed.  Constitutional:      General: He is not in acute distress.    Appearance: Normal appearance. He is well-developed and well-nourished. He is not toxic-appearing.  HENT:     Head: Normocephalic and atraumatic.  Eyes:      General: Lids are normal.     Extraocular Movements: EOM normal.     Conjunctiva/sclera: Conjunctivae normal.     Pupils: Pupils are equal, round, and reactive to light.  Neck:     Thyroid: No thyroid mass.     Trachea: No tracheal deviation.  Cardiovascular:     Rate and Rhythm: Normal rate and regular rhythm.     Heart sounds: Normal heart sounds. No murmur heard. No gallop.   Pulmonary:     Effort: Pulmonary effort is normal. No respiratory distress.     Breath sounds: Normal breath sounds. No stridor. No decreased breath sounds, wheezing, rhonchi or rales.  Abdominal:     General: Bowel sounds are normal. There is no distension.     Palpations: Abdomen is soft.     Tenderness: There is no abdominal tenderness. There is no CVA tenderness or rebound.  Musculoskeletal:        General: No tenderness or edema. Normal range of motion.     Cervical back: Normal range of motion and neck supple.  Skin:    General: Skin is warm and dry.     Findings: No abrasion or rash.  Neurological:     Mental Status: He is oriented to person, place, and time. He is lethargic.     GCS: GCS eye subscore is 4. GCS verbal subscore is 5. GCS motor subscore is 6.     Cranial Nerves: No cranial nerve deficit.     Sensory: No sensory deficit.     Deep Tendon Reflexes: Strength normal.     Comments: Head motions concerning for EPS symptoms  Psychiatric:        Attention and Perception: Attention normal. He does not perceive visual hallucinations.        Mood and Affect: Mood and affect normal. Affect is flat.        Speech: Speech is delayed.        Behavior: Behavior is withdrawn.        Thought Content: Thought content does not include homicidal or suicidal ideation.     ED Results / Procedures / Treatments   Labs (  all labs ordered are listed, but only abnormal results are displayed) Labs Reviewed  ETHANOL  RAPID URINE DRUG SCREEN, HOSP PERFORMED  SALICYLATE LEVEL  ACETAMINOPHEN LEVEL  CBC WITH  DIFFERENTIAL/PLATELET  COMPREHENSIVE METABOLIC PANEL    EKG EKG Interpretation  Date/Time:  Sunday September 12 2020 19:33:37 EST Ventricular Rate:  68 PR Interval:  144 QRS Duration: 90 QT Interval:  460 QTC Calculation: 489 R Axis:   -23 Text Interpretation: Normal sinus rhythm Nonspecific ST abnormality Prolonged QT Abnormal ECG No significant change since last tracing Confirmed by Lacretia Leigh ([id number]) on 09/12/2020 7:46:48 PM   Radiology No results found.  Procedures Procedures (including critical care time)  Medications Ordered in ED Medications  0.9 %  sodium chloride infusion (has no administration in time range)    ED Course  I have reviewed the triage vital signs and the nursing notes.  Pertinent labs & imaging results that were available during my care of the patient were reviewed by me and considered in my medical decision making (see chart for details).    MDM Rules/Calculators/A&P                          Patient ambulated in department at his baseline.  Patient states that he has had these tests for quite a while and that they are unchanged.  Was monitored here for several hours and is at his baseline.  Instructed to not take any more Benadryl and will discharge home Final Clinical Impression(s) / ED Diagnoses Final diagnoses:  None    Rx / DC Orders ED Discharge Orders    None       Lacretia Leigh, MD 09/12/20 2216

## 2020-09-12 NOTE — ED Notes (Signed)
Poison Control Recommendations Get EKG and Tylenol level Monitor for increased sedation

## 2020-09-12 NOTE — ED Notes (Signed)
Pt usually ambulates with the assistance of a walker. He ambulated today with one staff stand-by assist with a shuffle-like gait.

## 2020-09-30 ENCOUNTER — Other Ambulatory Visit: Payer: Self-pay | Admitting: Family Medicine

## 2020-09-30 DIAGNOSIS — J438 Other emphysema: Secondary | ICD-10-CM

## 2020-10-02 ENCOUNTER — Other Ambulatory Visit: Payer: Self-pay

## 2020-10-02 ENCOUNTER — Encounter (HOSPITAL_COMMUNITY): Payer: Self-pay | Admitting: *Deleted

## 2020-10-02 DIAGNOSIS — R109 Unspecified abdominal pain: Secondary | ICD-10-CM | POA: Diagnosis not present

## 2020-10-02 DIAGNOSIS — R339 Retention of urine, unspecified: Secondary | ICD-10-CM | POA: Diagnosis not present

## 2020-10-02 DIAGNOSIS — F419 Anxiety disorder, unspecified: Secondary | ICD-10-CM | POA: Insufficient documentation

## 2020-10-02 DIAGNOSIS — Z5321 Procedure and treatment not carried out due to patient leaving prior to being seen by health care provider: Secondary | ICD-10-CM | POA: Insufficient documentation

## 2020-10-02 DIAGNOSIS — R079 Chest pain, unspecified: Secondary | ICD-10-CM | POA: Diagnosis present

## 2020-10-02 LAB — CBC
HCT: 45.3 % (ref 39.0–52.0)
Hemoglobin: 15.5 g/dL (ref 13.0–17.0)
MCH: 34.8 pg — ABNORMAL HIGH (ref 26.0–34.0)
MCHC: 34.2 g/dL (ref 30.0–36.0)
MCV: 101.6 fL — ABNORMAL HIGH (ref 80.0–100.0)
Platelets: 159 10*3/uL (ref 150–400)
RBC: 4.46 MIL/uL (ref 4.22–5.81)
RDW: 15.3 % (ref 11.5–15.5)
WBC: 5.9 10*3/uL (ref 4.0–10.5)
nRBC: 0 % (ref 0.0–0.2)

## 2020-10-02 LAB — URINALYSIS, ROUTINE W REFLEX MICROSCOPIC
Bilirubin Urine: NEGATIVE
Glucose, UA: NEGATIVE mg/dL
Hgb urine dipstick: NEGATIVE
Ketones, ur: 5 mg/dL — AB
Nitrite: POSITIVE — AB
Protein, ur: 30 mg/dL — AB
Specific Gravity, Urine: 1.039 — ABNORMAL HIGH (ref 1.005–1.030)
WBC, UA: >50 WBC/hpf — ABNORMAL HIGH (ref 0–5)
pH: 5 (ref 5.0–8.0)

## 2020-10-02 LAB — BASIC METABOLIC PANEL
Anion gap: 10 (ref 5–15)
BUN: 20 mg/dL (ref 6–20)
CO2: 25 mmol/L (ref 22–32)
Calcium: 9.9 mg/dL (ref 8.9–10.3)
Chloride: 110 mmol/L (ref 98–111)
Creatinine, Ser: 1.18 mg/dL (ref 0.61–1.24)
GFR, Estimated: >60 mL/min (ref >60)
Glucose, Bld: 99 mg/dL (ref 70–99)
Potassium: 4.5 mmol/L (ref 3.5–5.1)
Sodium: 145 mmol/L (ref 135–145)

## 2020-10-02 LAB — TROPONIN I (HIGH SENSITIVITY): Troponin I (High Sensitivity): 4 ng/L (ref <18)

## 2020-10-02 NOTE — ED Triage Notes (Signed)
BIB EMS with multiple complaints, anxious urinary retention, abd pain, chest pain, history of psych.120/84-98-20-98% CBG 106

## 2020-10-03 ENCOUNTER — Emergency Department (HOSPITAL_COMMUNITY)
Admission: EM | Admit: 2020-10-03 | Discharge: 2020-10-03 | Disposition: A | Discharge status: Home / Self Care | Payer: Medicare Other | Attending: Emergency Medicine | Admitting: Emergency Medicine

## 2020-10-24 ENCOUNTER — Other Ambulatory Visit: Payer: Self-pay | Admitting: Family Medicine

## 2020-10-24 DIAGNOSIS — J438 Other emphysema: Secondary | ICD-10-CM

## 2020-10-24 NOTE — Telephone Encounter (Signed)
Request form pharmacy for Spiriva inhaler- note written no further refills until seen- please review if appropriate to give short term refill.

## 2020-10-26 ENCOUNTER — Ambulatory Visit: Payer: Medicare Other | Admitting: Family Medicine

## 2020-10-29 ENCOUNTER — Emergency Department (HOSPITAL_COMMUNITY): Payer: Medicare Other

## 2020-10-29 ENCOUNTER — Other Ambulatory Visit: Payer: Self-pay

## 2020-10-29 ENCOUNTER — Emergency Department (HOSPITAL_COMMUNITY)
Admission: EM | Admit: 2020-10-29 | Discharge: 2020-10-30 | Disposition: A | Discharge status: Home / Self Care | Payer: Medicare Other | Attending: Emergency Medicine | Admitting: Emergency Medicine

## 2020-10-29 DIAGNOSIS — Z5321 Procedure and treatment not carried out due to patient leaving prior to being seen by health care provider: Secondary | ICD-10-CM | POA: Insufficient documentation

## 2020-10-29 DIAGNOSIS — Z7901 Long term (current) use of anticoagulants: Secondary | ICD-10-CM | POA: Diagnosis not present

## 2020-10-29 DIAGNOSIS — R519 Headache, unspecified: Secondary | ICD-10-CM | POA: Insufficient documentation

## 2020-10-29 DIAGNOSIS — W19XXXA Unspecified fall, initial encounter: Secondary | ICD-10-CM | POA: Insufficient documentation

## 2020-10-29 DIAGNOSIS — G8929 Other chronic pain: Secondary | ICD-10-CM | POA: Insufficient documentation

## 2020-10-29 NOTE — ED Triage Notes (Addendum)
Pt presents to ED BIB [facility]. Pt c/o HA. Pt reports that he had fall x2w ago. Pt has chronic HA and reports that it has worsened. Pt is on eliquis and reports ETOH tonight.  EMS VS -  138/76 HR - 100 RR - 16 CBG - 74 98.1

## 2020-10-30 NOTE — ED Notes (Signed)
Pt not found in waiting room for vitals.

## 2020-11-16 ENCOUNTER — Other Ambulatory Visit: Payer: Self-pay | Admitting: Family Medicine

## 2020-11-16 DIAGNOSIS — J438 Other emphysema: Secondary | ICD-10-CM

## 2020-11-30 ENCOUNTER — Encounter: Payer: Self-pay | Admitting: Infectious Diseases

## 2020-11-30 ENCOUNTER — Other Ambulatory Visit: Payer: Self-pay

## 2020-11-30 ENCOUNTER — Other Ambulatory Visit (HOSPITAL_COMMUNITY)
Admission: RE | Admit: 2020-11-30 | Discharge: 2020-11-30 | Disposition: A | Discharge status: Home / Self Care | Payer: Medicare Other | Source: Ambulatory Visit | Attending: Infectious Diseases | Admitting: Infectious Diseases

## 2020-11-30 ENCOUNTER — Ambulatory Visit (INDEPENDENT_AMBULATORY_CARE_PROVIDER_SITE_OTHER): Payer: Medicare Other | Admitting: Infectious Diseases

## 2020-11-30 VITALS — HR 85 | Temp 98.0°F | Resp 18 | Ht 68.0 in | Wt 132.0 lb

## 2020-11-30 DIAGNOSIS — Z113 Encounter for screening for infections with a predominantly sexual mode of transmission: Secondary | ICD-10-CM

## 2020-11-30 DIAGNOSIS — F1021 Alcohol dependence, in remission: Secondary | ICD-10-CM

## 2020-11-30 DIAGNOSIS — Z79899 Other long term (current) drug therapy: Secondary | ICD-10-CM

## 2020-11-30 DIAGNOSIS — Z21 Asymptomatic human immunodeficiency virus [HIV] infection status: Secondary | ICD-10-CM

## 2020-11-30 DIAGNOSIS — Z23 Encounter for immunization: Secondary | ICD-10-CM | POA: Diagnosis not present

## 2020-11-30 DIAGNOSIS — B20 Human immunodeficiency virus [HIV] disease: Secondary | ICD-10-CM

## 2020-11-30 DIAGNOSIS — F200 Paranoid schizophrenia: Secondary | ICD-10-CM | POA: Diagnosis not present

## 2020-11-30 LAB — URINE CYTOLOGY ANCILLARY ONLY
Chlamydia: NEGATIVE
Comment: NEGATIVE
Comment: NORMAL
Neisseria Gonorrhea: NEGATIVE

## 2020-11-30 NOTE — Progress Notes (Signed)
Patient Name: [name] Herring  Date of Birth: June 10, 1961  MRN: [id number]  PCP: Sonia Side., FNP     SUBJECTIVE:  Brief Narrative:  [name] Herring is a 60 y.o. AA male with a history of HIV disease Dx 08/18/1998. Previously followed at [facility].  HIV Risk: MSM.  History of OIs: uncertain.  History of ETOH/cocaine abuse, previously homeless.    Previous Regimens:   Kaletra + Combivir   Genvoya > stopped d/t DDI with [facility]  Biktarvy 2019 >> suppressed    CC: [name] Herring is doing well today. Here for routine follow up care. His grandson had heart surgery recently - was staying with them in [place] but back in town now. Has not had flu or covid booster yet.  Not sexually active for many years.   Concerns: states he stopped drinking last 3 weeks ago. Wants to make this an ongoing thing. Has done AA meetings in the past but no current sponsor support.    Has been taking his psych medications that make him sleep from 11 pm - 12 noon sometimes. He is requesting additional sleep medication to help him on the nights he takes his psych medications earlier in the afternoon.   Noted to have had several ER visits lately - once for accidental overdose of sleeping pills in December. Two visits in January for anxiety, urinary retention and abdominal pain where he left prior to being seen. ETOH mentioned at a few of these visits.    Review of Systems  Constitutional: Negative for chills and fever.  HENT: Negative for tinnitus.   Eyes: Negative for blurred vision and photophobia.  Respiratory: Negative for cough and sputum production.   Cardiovascular: Negative for chest pain.  Gastrointestinal: Negative for diarrhea, nausea and vomiting.  Genitourinary: Negative for dysuria.  Skin: Negative for rash.  Neurological: Negative for headaches.     Past Medical History:  Diagnosis Date  . Depression   . HIV (human immunodeficiency virus infection) ([place])   . Hypertension   .  Immune deficiency disorder ([place])   . PE (pulmonary thromboembolism) ([place]) 2018  . Personality disorder ([place])   . Schizophrenia [facility])      Outpatient Medications Prior to Visit  Medication Sig Dispense Refill  . acetaminophen (TYLENOL) 500 MG tablet Take 1 tablet (500 mg total) by mouth every 6 (six) hours as needed for moderate pain. (Patient taking differently: Take 1,000 mg by mouth every 6 (six) hours as needed for moderate pain.) 30 tablet 0  . amLODipine (NORVASC) 10 MG tablet Take 1 tablet (10 mg total) by mouth daily. 30 tablet 0  . apixaban (ELIQUIS) 5 MG TABS tablet Take 1 tablet (5 mg total) by mouth 2 (two) times daily. 60 tablet 0  . benztropine (COGENTIN) 0.5 MG tablet Take 0.5 mg by mouth 2 (two) times daily.    . benztropine (COGENTIN) 1 MG tablet 1 tab in the morning 2 tabs at night 14 tablet 0  . bictegravir-emtricitabine-tenofovir AF (BIKTARVY) 50-200-25 MG TABS tablet Take 1 tablet by mouth daily. 90 tablet 3  . butalbital-acetaminophen-caffeine (ESGIC) 50-325-40 MG tablet Take 1 tablet by mouth 2 (two) times daily as needed for headache. 30 tablet 0  . feeding supplement, ENSURE ENLIVE, (ENSURE ENLIVE) LIQD Take 237 mLs by mouth 3 (three) times daily between meals. 237 mL 12  . fluticasone (FLONASE) 50 MCG/ACT nasal spray Place 1-2 sprays into both nostrils daily as needed for rhinitis.    Marland Kitchen gabapentin (  NEURONTIN) 800 MG tablet Take 800 mg by mouth at bedtime.    Marland Kitchen lisinopril (ZESTRIL) 40 MG tablet Take 40 mg by mouth daily.    . magnesium oxide (MAG-OX) 400 (241.3 Mg) MG tablet Take 1 tablet (400 mg total) by mouth 2 (two) times daily. 60 tablet 0  . Melatonin 3-10 MG TABS Take 1 tablet by mouth at bedtime.    . metoprolol tartrate (LOPRESSOR) 25 MG tablet Take 1 tablet (25 mg total) by mouth 2 (two) times daily. 180 tablet 1  . mirtazapine (REMERON) 30 MG tablet Take 30 mg by mouth at bedtime.    . montelukast (SINGULAIR) 10 MG tablet Take 10 mg by mouth daily.    .  naltrexone (DEPADE) 50 MG tablet Take 50 mg by mouth daily.    Marland Kitchen oxyCODONE-acetaminophen (PERCOCET/ROXICET) 5-325 MG tablet Take 1 tablet by mouth every 6 (six) hours as needed for severe pain. 15 tablet 0  . pantoprazole (PROTONIX) 40 MG tablet Take 40 mg by mouth daily.     . polyethylene glycol (MIRALAX / GLYCOLAX) packet Take 17 g by mouth daily as needed for moderate constipation.     . QUEtiapine (SEROQUEL) 400 MG tablet Take 0.5 tablets (200 mg total) by mouth at bedtime.    Marland Kitchen RISPERDAL 1 MG tablet Take 1-2 mg by mouth See admin instructions. Take 1mg  in the morning, and 2mg  at bedtime    . SYMBICORT 160-4.5 MCG/ACT inhaler Inhale 2 puffs into the lungs 2 (two) times daily.    . tamsulosin (FLOMAX) 0.4 MG CAPS capsule Take 1 capsule (0.4 mg total) by mouth daily after supper. 30 capsule 0  . tiotropium (SPIRIVA HANDIHALER) 18 MCG inhalation capsule Inhale the contents of 1 capsule via handihaler every day 30 capsule 0  . traZODone (DESYREL) 50 MG tablet Take 1 tablet (50 mg total) by mouth at bedtime. 30 tablet 3  . Valbenazine Tosylate (INGREZZA) 80 MG CAPS Take 80 mg by mouth 2 (two) times daily.      No facility-administered medications prior to visit.   Allergies  Allergen Reactions  . Amoxicillin Shortness Of Breath and Rash    Has patient had a PCN reaction causing immediate rash, facial/tongue/throat swelling, [name] Has patient had a PCN reaction causing severe rash involving mucus membranes or skin necrosis: no Has patient had a PCN reaction that required hospitalization: yes drs office visit Has patient had a PCN reaction occurring within the last 10 years: no If all of the above answers are "NO", then may proceed with Cephalosporin use.   . Latex Shortness Of Breath  . Abilify [Aripiprazole] Other (See Comments)    Double vision  . Amoxicillin Rash   Social History   Tobacco Use  . Smoking status: Current Every Day Smoker     Packs/day: 0.50    Years: 33.00    Pack years: 16.50    Types: Cigarettes    Last attempt to quit: 04/25/2019    Years since quitting: 1.6  . Smokeless tobacco: Never Used  Vaping Use  . Vaping Use: Never used  Substance Use Topics  . Alcohol use: Not Currently    Comment: quit 2020  . Drug use: Yes    Types: Cocaine    Comment: Patient denies use, but UDS + cocaine   Objective:  Vitals:   11/30/20 0844  Pulse: 85  Resp: 18  Temp: 98 F (36.7 C)  Weight: 132 lb (59.9 kg)  Height: 5\' 8"  (1.727 m)   Body mass index is 20.07 kg/m.  Physical Exam Constitutional:      Appearance: He is well-developed.     Comments: Thin appearing man.  Seated comfortably in the visit.  Pleasant.   HENT:     Mouth/Throat:     Dentition: Normal dentition. No dental abscesses.  Cardiovascular:     Rate and Rhythm: Normal rate and regular rhythm.     Heart sounds: Normal heart sounds.  Pulmonary:     Effort: Pulmonary effort is normal.     Breath sounds: Normal breath sounds.  Abdominal:     General: There is no distension.     Palpations: Abdomen is soft.     Tenderness: There is no abdominal tenderness.  Lymphadenopathy:     Cervical: No cervical adenopathy.  Skin:    General: Skin is warm and dry.     Findings: No rash.  Neurological:     Mental Status: He is alert and oriented to person, place, and time.  Psychiatric:        Judgment: Judgment normal.     Comments: In good spirits today. Appears mildly anxious.     Lab Results Lab Results  Component Value Date   WBC 5.9 10/02/2020   HGB 15.5 10/02/2020   HCT 45.3 10/02/2020   MCV 101.6 (H) 10/02/2020   PLT 159 10/02/2020    Lab Results  Component Value Date   CREATININE 1.18 10/02/2020   BUN 20 10/02/2020   NA 145 10/02/2020   K 4.5 10/02/2020   CL 110 10/02/2020   CO2 25 10/02/2020    Lab Results  Component Value Date   ALT 22 09/12/2020   AST 27 09/12/2020   ALKPHOS 51 09/12/2020   BILITOT 0.7 09/12/2020     Lab Results  Component Value Date   CHOL 113 01/22/2019   HDL 56 01/22/2019   LDLCALC 40 01/22/2019   TRIG 86 01/22/2019   CHOLHDL 2.0 01/22/2019   HIV 1 RNA Quant  Date Value  05/21/2020 34 Copies/mL (H)  06/10/2019 <20 DETECTED copies/mL (A)  01/22/2019 <20 DETECTED copies/mL (A)   CD4 T Cell Abs (/uL)  Date Value  05/21/2020 256 (L)  01/22/2019 199 (L)  06/17/2018 250 (L)   Lab Results  Component Value Date   HAV REACTIVE (A) 06/17/2018   Lab Results  Component Value Date   HEPBSAG NON-REACTIVE 06/17/2018   HEPBSAB BORDERLINE (A) 06/17/2018    ASSESSMENT & PLAN:   Problem List Items Addressed This Visit      Unprioritized   Paranoid schizophrenia ([place])    Appears a little anxious today.  Follow up with [facility] 3/18 - I suggested he consider taking all of his psychiatric medications at the same time during the evening to help both with consistency and sleep. I don't think he needs something additional if they make him sleep from 11pm through noon.       Human immunodeficiency virus (HIV) disease ([place]) (Chronic)    Doing well on Biktarvy once daily. Has had VL < 50 copies for 3 years now. CD4 in mid 200s now. No need for any prophylaxis. No drug interactions noted.  COVID booster shot scheduled with [facility] tomorrow morning at 10:00 am  Flu shot today.  Update pertinent labs today.   Return in about 4 months (around 04/01/2021).       Alcohol use disorder, severe, in early remission ([place])    Multiple relapses  he acknowledges. Given he is on blood thinner we discussed how unsafe this is for him to continue drinking and high risk for falling. He agrees. We talked about re-entering AA and seeking out a sponsor for support. His son is also struggling with alcoholism and hopeful they can quit together.        Other Visit Diagnoses    Asymptomatic HIV infection ([place])    -  Primary   Relevant Orders   HIV-1 RNA quant-no reflex-bld   COMPLETE METABOLIC  PANEL WITH GFR   CBC with Differential/Platelet   T-helper cell (CD4)- ([facility] clinic only)   Paranoid schizophrenia, chronic condition ([place])       Routine screening for STI (sexually transmitted infection)       Relevant Orders   RPR   Urine cytology ancillary only   High risk medication use       Relevant Orders   Lipid panel   Urinalysis       Janene Madeira, MSN, NP-C [facility] [email or web address] Pager: [phone] Office: [place]: [phone]    11/30/20

## 2020-11-30 NOTE — Assessment & Plan Note (Signed)
Appears a little anxious today.  Follow up with [facility] 3/18 - I suggested he consider taking all of his psychiatric medications at the same time during the evening to help both with consistency and sleep. I don't think he needs something additional if they make him sleep from 11pm through noon.

## 2020-11-30 NOTE — Assessment & Plan Note (Signed)
Multiple relapses he acknowledges. Given he is on blood thinner we discussed how unsafe this is for him to continue drinking and high risk for falling. He agrees. We talked about re-entering AA and seeking out a sponsor for support. His son is also struggling with alcoholism and hopeful they can quit together.

## 2020-11-30 NOTE — Assessment & Plan Note (Signed)
Doing well on Biktarvy once daily. Has had VL < 50 copies for 3 years now. CD4 in mid 200s now. No need for any prophylaxis. No drug interactions noted.  COVID booster shot scheduled with [facility] tomorrow morning at 10:00 am  Flu shot today.  Update pertinent labs today.   Return in about 4 months (around 04/01/2021).

## 2020-11-30 NOTE — Patient Instructions (Addendum)
Nice to see you as always.   Please continue your Biktarvy once a day.   Try taking your psych medications at night every night - that way you can try to get better chances at sleep without having to add another medication. Talk with your psychiatrist about this on the 18th at your next appointment.   We gave you your flu shot today.   Please call or go to [facility]  Address: [address], [place], [place] [address] Phone: [phone]  Tomorrow Wednesday March 9th at 10:00 am at [facility] we have you scheduled for the COVID booster shot  [address] [place], [place], [place] [address]  First floor of the same building we are in.    Stop by our lab on your way out and we will see you in 4 months again.

## 2020-12-01 ENCOUNTER — Ambulatory Visit: Payer: Medicare Other

## 2020-12-01 LAB — URINALYSIS
Bilirubin Urine: NEGATIVE
Glucose, UA: NEGATIVE
Hgb urine dipstick: NEGATIVE
Ketones, ur: NEGATIVE
Leukocytes,Ua: NEGATIVE
Nitrite: POSITIVE — AB
Protein, ur: NEGATIVE
Specific Gravity, Urine: 1.031 (ref 1.001–1.03)
pH: 6 (ref 5.0–8.0)

## 2020-12-01 LAB — T-HELPER CELL (CD4) - (RCID CLINIC ONLY)
CD4 % Helper T Cell: 25 % — ABNORMAL LOW (ref 33–65)
CD4 T Cell Abs: 238 /uL — ABNORMAL LOW (ref 400–1790)

## 2020-12-02 ENCOUNTER — Emergency Department (HOSPITAL_COMMUNITY): Payer: Medicare Other

## 2020-12-02 ENCOUNTER — Emergency Department (HOSPITAL_COMMUNITY)
Admission: EM | Admit: 2020-12-02 | Discharge: 2020-12-03 | Disposition: A | Discharge status: Home / Self Care | Payer: Medicare Other | Attending: Emergency Medicine | Admitting: Emergency Medicine

## 2020-12-02 ENCOUNTER — Encounter (HOSPITAL_COMMUNITY): Payer: Self-pay

## 2020-12-02 ENCOUNTER — Other Ambulatory Visit: Payer: Self-pay

## 2020-12-02 DIAGNOSIS — Z7901 Long term (current) use of anticoagulants: Secondary | ICD-10-CM | POA: Diagnosis not present

## 2020-12-02 DIAGNOSIS — Z79899 Other long term (current) drug therapy: Secondary | ICD-10-CM | POA: Insufficient documentation

## 2020-12-02 DIAGNOSIS — Z21 Asymptomatic human immunodeficiency virus [HIV] infection status: Secondary | ICD-10-CM | POA: Diagnosis not present

## 2020-12-02 DIAGNOSIS — E039 Hypothyroidism, unspecified: Secondary | ICD-10-CM | POA: Diagnosis not present

## 2020-12-02 DIAGNOSIS — I10 Essential (primary) hypertension: Secondary | ICD-10-CM | POA: Insufficient documentation

## 2020-12-02 DIAGNOSIS — R0602 Shortness of breath: Secondary | ICD-10-CM | POA: Diagnosis present

## 2020-12-02 DIAGNOSIS — J441 Chronic obstructive pulmonary disease with (acute) exacerbation: Secondary | ICD-10-CM | POA: Insufficient documentation

## 2020-12-02 DIAGNOSIS — Z7951 Long term (current) use of inhaled steroids: Secondary | ICD-10-CM | POA: Insufficient documentation

## 2020-12-02 DIAGNOSIS — F17210 Nicotine dependence, cigarettes, uncomplicated: Secondary | ICD-10-CM | POA: Insufficient documentation

## 2020-12-02 DIAGNOSIS — Z91040 Latex allergy status: Secondary | ICD-10-CM | POA: Diagnosis not present

## 2020-12-02 LAB — CBC WITH DIFFERENTIAL/PLATELET
Abs Immature Granulocytes: 0.01 10*3/uL (ref 0.00–0.07)
Basophils Absolute: 0 10*3/uL (ref 0.0–0.1)
Basophils Relative: 0 %
Eosinophils Absolute: 0 10*3/uL (ref 0.0–0.5)
Eosinophils Relative: 0 %
HCT: 37.9 % — ABNORMAL LOW (ref 39.0–52.0)
Hemoglobin: 13.4 g/dL (ref 13.0–17.0)
Immature Granulocytes: 0 %
Lymphocytes Relative: 7 %
Lymphs Abs: 0.4 10*3/uL — ABNORMAL LOW (ref 0.7–4.0)
MCH: 36.4 pg — ABNORMAL HIGH (ref 26.0–34.0)
MCHC: 35.4 g/dL (ref 30.0–36.0)
MCV: 103 fL — ABNORMAL HIGH (ref 80.0–100.0)
Monocytes Absolute: 0.1 10*3/uL (ref 0.1–1.0)
Monocytes Relative: 1 %
Neutro Abs: 5 10*3/uL (ref 1.7–7.7)
Neutrophils Relative %: 92 %
Platelets: 167 10*3/uL (ref 150–400)
RBC: 3.68 MIL/uL — ABNORMAL LOW (ref 4.22–5.81)
RDW: 15.5 % (ref 11.5–15.5)
WBC: 5.5 10*3/uL (ref 4.0–10.5)
nRBC: 0 % (ref 0.0–0.2)

## 2020-12-02 LAB — COMPREHENSIVE METABOLIC PANEL
ALT: 18 U/L (ref 0–44)
AST: 18 U/L (ref 15–41)
Albumin: 3.9 g/dL (ref 3.5–5.0)
Alkaline Phosphatase: 54 U/L (ref 38–126)
Anion gap: 7 (ref 5–15)
BUN: 23 mg/dL — ABNORMAL HIGH (ref 6–20)
CO2: 22 mmol/L (ref 22–32)
Calcium: 9.4 mg/dL (ref 8.9–10.3)
Chloride: 112 mmol/L — ABNORMAL HIGH (ref 98–111)
Creatinine, Ser: 1.11 mg/dL (ref 0.61–1.24)
GFR, Estimated: >60 mL/min (ref >60)
Glucose, Bld: 174 mg/dL — ABNORMAL HIGH (ref 70–99)
Potassium: 3.6 mmol/L (ref 3.5–5.1)
Sodium: 141 mmol/L (ref 135–145)
Total Bilirubin: 0.4 mg/dL (ref 0.3–1.2)
Total Protein: 6.4 g/dL — ABNORMAL LOW (ref 6.5–8.1)

## 2020-12-02 LAB — BRAIN NATRIURETIC PEPTIDE: B Natriuretic Peptide: 15.6 pg/mL (ref 0.0–100.0)

## 2020-12-02 LAB — TROPONIN I (HIGH SENSITIVITY): Troponin I (High Sensitivity): 3 ng/L (ref <18)

## 2020-12-02 MED ORDER — IPRATROPIUM-ALBUTEROL 0.5-2.5 (3) MG/3ML IN SOLN
RESPIRATORY_TRACT | Status: AC
  Filled 2020-12-02: qty 3

## 2020-12-02 MED ORDER — IPRATROPIUM-ALBUTEROL 0.5-2.5 (3) MG/3ML IN SOLN
6.0000 mL | Freq: Once | RESPIRATORY_TRACT | Status: AC
  Administered 2020-12-02: 6 mL via RESPIRATORY_TRACT
  Filled 2020-12-02: qty 3

## 2020-12-02 NOTE — ED Provider Notes (Signed)
[facility] EMERGENCY DEPARTMENT Provider Note   CSN: [id number] Arrival date & time: 12/02/20  1739     History Chief Complaint  Patient presents with  . Shortness of Breath    Started two days ago with progressive progression, COPD, was breathing 38 - 42 times per minute at home when EMS arrived at pts house, was sating 90% on room air and EMS states BBS extremely diminished    [name] Herring is a 60 y.o. male.  Patient has a history of HIV, hypertension, immune deficiency disorder, schizophrenia, PE on eliquis, COPD who presents with shortness of breath and chest pain.  Patient reports that symptoms started about 2 days ago.  He states that they have progressively gotten worse.  Patient reports chest tightness and decreased ability to take a deep breath.  Patient reports compliance with all of his medications including Biktarvy and COPD inhalers.  Patient was brought in by EMS after he called out.  On arrival, EMS report the patient was significantly tachypneic.  Patient received DuoNeb, Solu-Medrol, magnesium in route.  Symptoms improved slightly with an improved respiratory rate on arrival.  Patient denies any recent fevers or chills.  Patient reports generalized chest pain across his chest that worsens when he tries to take deeper breaths.  Patient denies any new cough.  Patient denies any lower extremity edema.  Patient reports that he does live alone but has been able to do basic tasks over the last few days.  Patient reports compliance with his Xarelto that he takes for pulmonary embolism history.    Past Medical History:  Diagnosis Date  . Depression   . HIV (human immunodeficiency virus infection) ([place])   . Hypertension   . Immune deficiency disorder ([place])   . PE (pulmonary thromboembolism) ([place]) 2018  . Personality disorder ([place])   . Schizophrenia [facility])     Patient Active Problem List   Diagnosis Date Noted  . AKI (acute kidney injury) ([place]) 01/15/2020   . History of pulmonary embolism 01/15/2020  . Genital warts 10/16/2019  . GERD with esophagitis 01/22/2019  . Protein C deficiency ([place]) 07/05/2017  . Benign neoplasm of transverse colon   . Emphysema lung ([place]) 11/20/2016  . DVT of lower extremity, bilateral ([place]) 11/04/2016  . Tobacco abuse 11/02/2016  . PE (pulmonary thromboembolism) ([place]) 11/02/2016  . HTN (hypertension) 08/25/2015  . Alcohol use disorder, severe, in early remission ([place]) 12/27/2014  . Human immunodeficiency virus (HIV) disease ([place]) 11/09/2006  . CA IN SITU, RECTUM 11/09/2006  . Hypothyroidism 11/09/2006  . Paranoid schizophrenia ([place]) 11/09/2006  . DISORDER, BIPOLAR NOS 11/09/2006  . Depression 11/09/2006    Past Surgical History:  Procedure Laterality Date  . COLONOSCOPY WITH PROPOFOL N/A 06/15/2017   Procedure: COLONOSCOPY WITH PROPOFOL;  Surgeon: Irene Shipper, MD;  Location: [facility];  Service: Endoscopy;  Laterality: N/A;  . ESOPHAGOGASTRODUODENOSCOPY (EGD) WITH PROPOFOL N/A 06/15/2017   Procedure: ESOPHAGOGASTRODUODENOSCOPY (EGD) WITH PROPOFOL;  Surgeon: Irene Shipper, MD;  Location: [facility];  Service: Endoscopy;  Laterality: N/A;  . IR GENERIC HISTORICAL  11/04/2016   IR IVC FILTER PLMT / S&I Burke Keels GUID/MOD SED 11/04/2016 Aletta Edouard, MD [facility]-INTERV RAD       Family History  Problem Relation Age of Onset  . CAD Mother   . Kidney disease Sister   . Lupus Brother   . Cancer Maternal Grandmother   . Stroke Paternal Grandmother     Social History   Tobacco Use  .  Smoking status: Current Every Day Smoker    Packs/day: 0.50    Years: 33.00    Pack years: 16.50    Types: Cigarettes    Last attempt to quit: 04/25/2019    Years since quitting: 1.6  . Smokeless tobacco: Never Used  Vaping Use  . Vaping Use: Never used  Substance Use Topics  . Alcohol use: Yes    Comment: states once a month  . Drug use: Not Currently    Comment: Patient denies use, but UDS + cocaine    Home  Medications Prior to Admission medications   Medication Sig Start Date End Date Taking? Authorizing Provider  acetaminophen (TYLENOL) 500 MG tablet Take 1 tablet (500 mg total) by mouth every 6 (six) hours as needed for moderate pain. Patient taking differently: Take 1,000 mg by mouth every 6 (six) hours as needed for moderate pain. 11/07/16   Eugenie Filler, MD  amLODipine (NORVASC) 10 MG tablet Take 1 tablet (10 mg total) by mouth daily. 01/20/20   Elgergawy, Silver Huguenin, MD  apixaban (ELIQUIS) 5 MG TABS tablet Take 1 tablet (5 mg total) by mouth 2 (two) times daily. 06/15/20   [place] Callas, NP  benztropine (COGENTIN) 0.5 MG tablet Take 0.5 mg by mouth 2 (two) times daily. 12/27/19   [provider]  benztropine (COGENTIN) 1 MG tablet 1 tab in the morning 2 tabs at night 11/10/16   Freeman Caldron M, PA-C  bictegravir-emtricitabine-tenofovir AF (BIKTARVY) 50-200-25 MG TABS tablet Take 1 tablet by mouth daily. 05/21/20   [place] Callas, NP  butalbital-acetaminophen-caffeine (ESGIC) [phone] MG tablet Take 1 tablet by mouth 2 (two) times daily as needed for headache. 02/26/18   Charlott Rakes, MD  feeding supplement, ENSURE ENLIVE, (ENSURE ENLIVE) LIQD Take 237 mLs by mouth 3 (three) times daily between meals. 01/19/20   Elgergawy, Silver Huguenin, MD  fluticasone (FLONASE) 50 MCG/ACT nasal spray Place 1-2 sprays into both nostrils daily as needed for rhinitis. 11/01/19   [provider]  gabapentin (NEURONTIN) 800 MG tablet Take 800 mg by mouth at bedtime. 10/03/19   [provider]  lisinopril (ZESTRIL) 40 MG tablet Take 40 mg by mouth daily. 07/30/19   [provider]  magnesium oxide (MAG-OX) 400 (241.3 Mg) MG tablet Take 1 tablet (400 mg total) by mouth 2 (two) times daily. 11/07/16   Eugenie Filler, MD  Melatonin 3-10 MG TABS Take 1 tablet by mouth at bedtime. 11/27/19   [provider]  metoprolol tartrate (LOPRESSOR) 25 MG tablet Take 1 tablet (25 mg  total) by mouth 2 (two) times daily. 08/07/18   Charlott Rakes, MD  mirtazapine (REMERON) 30 MG tablet Take 30 mg by mouth at bedtime. 12/27/19   [provider]  montelukast (SINGULAIR) 10 MG tablet Take 10 mg by mouth daily. 10/02/19   [provider]  naltrexone (DEPADE) 50 MG tablet Take 50 mg by mouth daily. 12/27/19   [provider]  oxyCODONE-acetaminophen (PERCOCET/ROXICET) 5-325 MG tablet Take 1 tablet by mouth every 6 (six) hours as needed for severe pain. 01/21/20   Long, Wonda Olds, MD  pantoprazole (PROTONIX) 40 MG tablet Take 40 mg by mouth daily.  05/29/19   [provider]  polyethylene glycol (MIRALAX / GLYCOLAX) packet Take 17 g by mouth daily as needed for moderate constipation.     [provider]  QUEtiapine (SEROQUEL) 400 MG tablet Take 0.5 tablets (200 mg total) by mouth at bedtime. 01/19/20  Elgergawy, Silver Huguenin, MD  RISPERDAL 1 MG tablet Take 1-2 mg by mouth See admin instructions. Take 1mg  in the morning, and 2mg  at bedtime 01/13/20   [provider]  SYMBICORT 160-4.5 MCG/ACT inhaler Inhale 2 puffs into the lungs 2 (two) times daily. 10/03/19   [provider]  tamsulosin (FLOMAX) 0.4 MG CAPS capsule Take 1 capsule (0.4 mg total) by mouth daily after supper. 03/23/20   [place] Callas, NP  tiotropium (SPIRIVA HANDIHALER) 18 MCG inhalation capsule Inhale the contents of 1 capsule via handihaler every day 10/01/20   Sonia Side., FNP  traZODone (DESYREL) 50 MG tablet Take 1 tablet (50 mg total) by mouth at bedtime. 08/07/18   Charlott Rakes, MD  Valbenazine Tosylate (INGREZZA) 80 MG CAPS Take 80 mg by mouth 2 (two) times daily.     [provider]  cetirizine (ZYRTEC) 10 MG tablet Take 1 tablet (10 mg total) by mouth daily. Patient not taking: Reported on 10/22/2019 02/26/18 11/15/19  Charlott Rakes, MD  haloperidol (HALDOL) 10 MG tablet Take 0.5 tablets (5 mg total) by mouth 2 (two) times daily. Patient not  taking: Reported on 10/22/2019 06/18/17 11/15/19  Domenic Polite, MD    Allergies    Amoxicillin, Latex, Abilify [aripiprazole], and Amoxicillin  Review of Systems   Review of Systems  Constitutional: Negative for chills and fever.  HENT: Negative for ear pain and sore throat.   Eyes: Negative for pain and visual disturbance.  Respiratory: Positive for shortness of breath. Negative for cough.   Cardiovascular: Positive for chest pain. Negative for palpitations.  Gastrointestinal: Negative for abdominal pain and vomiting.  Genitourinary: Negative for dysuria and hematuria.  Musculoskeletal: Negative for arthralgias and back pain.  Skin: Negative for color change and rash.  Neurological: Negative for seizures and syncope.  All other systems reviewed and are negative.   Physical Exam Updated Vital Signs BP 117/81   Pulse 79   Temp 98.2 F (36.8 C) (Oral)   Resp (!) 31   Ht 5\' 8"  (1.727 m)   Wt 59.9 kg   SpO2 95%   BMI 20.08 kg/m   Physical Exam Vitals and nursing note reviewed.  Constitutional:      Appearance: He is ill-appearing.  HENT:     Head: Normocephalic and atraumatic.  Eyes:     Conjunctiva/sclera: Conjunctivae normal.  Cardiovascular:     Rate and Rhythm: Normal rate and regular rhythm.     Heart sounds: No murmur heard.   Pulmonary:     Effort: Pulmonary effort is normal. No tachypnea or respiratory distress.     Breath sounds: Decreased breath sounds present.  Abdominal:     Palpations: Abdomen is soft.     Tenderness: There is no abdominal tenderness.  Musculoskeletal:     Cervical back: Neck supple.     Right lower leg: No tenderness. No edema.     Left lower leg: No tenderness. No edema.  Skin:    General: Skin is warm and dry.  Neurological:     General: No focal deficit present.     Mental Status: He is alert and oriented to person, place, and time.     ED Results / Procedures / Treatments   Labs (all labs ordered are listed, but only  abnormal results are displayed) Labs Reviewed  CBC WITH DIFFERENTIAL/PLATELET - Abnormal; Notable for the following components:      Result Value   RBC 3.68 (*)    HCT  37.9 (*)    MCV 103.0 (*)    MCH 36.4 (*)    Lymphs Abs 0.4 (*)    All other components within normal limits  COMPREHENSIVE METABOLIC PANEL - Abnormal; Notable for the following components:   Chloride 112 (*)    Glucose, Bld 174 (*)    BUN 23 (*)    Total Protein 6.4 (*)    All other components within normal limits  BRAIN NATRIURETIC PEPTIDE  TROPONIN I (HIGH SENSITIVITY)    EKG EKG Interpretation  Date/Time:  Thursday December 02 2020 17:39:42 EST Ventricular Rate:  91 PR Interval:    QRS Duration: 97 QT Interval:  396 QTC Calculation: 488 R Axis:   -14 Text Interpretation: Sinus rhythm Borderline prolonged QT interval T wave abnormality Abnormal ECG Confirmed by Carmin Muskrat [phone]) on 12/02/2020 6:30:33 PM   Radiology DG Chest Portable 1 View  Result Date: 12/02/2020 CLINICAL DATA:  Shortness of breath EXAM: PORTABLE CHEST 1 VIEW COMPARISON:  02/26/2020 FINDINGS: Cardiac shadow is stable. Lungs are well aerated bilaterally. Minimal right basilar opacity is again seen likely related to scarring. No new focal abnormality is noted. No bony abnormality is seen. IMPRESSION: Right basilar opacity likely related to scarring is it is stable from the prior exam. Electronically Signed   By: Inez Catalina M.D.   On: 12/02/2020 19:24    Procedures Procedures  Medications Ordered in ED Medications  ipratropium-albuterol (DUONEB) 0.5-2.5 (3) MG/3ML nebulizer solution 6 mL (6 mLs Nebulization Given 12/02/20 1954)  ipratropium-albuterol (DUONEB) 0.5-2.5 (3) MG/3ML nebulizer solution (  Given 12/02/20 2000)    ED Course  I have reviewed the triage vital signs and the nursing notes.  Pertinent labs & imaging results that were available during my care of the patient were reviewed by me and considered in my medical  decision making (see chart for details).    MDM Rules/Calculators/A&P                         Patient presents with shortness of breath.  Exam notable for decreased breath sounds.  No evidence for volume overload including no pedal edema, no crackles noted on lung exam, BNP 15 and therefore low suspicion for HF exacerbation.  No wheezing appreciated on exam, however did get improvement after Atrovent albuterol with EMS.  Decreased breath sounds noted and therefore concern for little air movement with COPD exacerbation.  Patient given Solu-Medrol & magnesium with EMS and therefore we will continue treatment for COPD exacerbation here with duo nebs.  On reevaluation after patient started duo nebs, he reports feeling a lot better.  Considered PE given history, CT PE study ordered to evaluate pulmonary embolism.  Troponin ordered. Resulted 3. Given pain started two days ago, will not order delta troponin. EKG without ischemic changes. Patient's pain pleuritic, sharp, not exertional. Unlikely ACS.  With patient's HIV status, patient followed in the ID clinic just 2 days ago.  At that time, patient had CD4 count greater than 200.  He had very minimal viral load.  He is doing well and he does not take any prophylactic antibiotics or medications.  Chest x-ray without evidence for pneumothorax or pneumonia.  No evidence for rib fractures.  Awaiting the results of patient's CT PE study. If negative, patient is HDS, on room air, and with improvement of respiratory distress. He can go home with treatment for COPD exacerbation.  Patient care handed off to next provider at 12:04.  Final Clinical Impression(s) / ED Diagnoses Final diagnoses:  None    Rx / DC Orders ED Discharge Orders    None       Kugler, [place], MD 12/03/20 0011    Carmin Muskrat, MD 12/03/20 [phone]

## 2020-12-02 NOTE — ED Triage Notes (Signed)
Picked up via EMS at 1640, severe SOB, no LOC, multiple meds administered via EMS, pt currently in no distress with SaO2 of 97 on room air and resp rate 29

## 2020-12-02 NOTE — Social Work (Signed)
CSW met with Pt at bedside. Pt states that he is receiving his behavioral health medications at [facility], but they are not connecting him with a therapist.  Pt would like to be able to speak with a therapist on an outpatient basis.  Due to the time of day, CSW unable to set up appointment for Pt.  CSW did provide resources for [facility] outpatient services. CSW also added resources to AVS. Pt states he is able to arrange his own transportation to appointments via taxis.

## 2020-12-03 ENCOUNTER — Emergency Department (HOSPITAL_COMMUNITY): Payer: Medicare Other

## 2020-12-03 LAB — COMPLETE METABOLIC PANEL WITH GFR
AG Ratio: 1.8 (calc) (ref 1.0–2.5)
ALT: 21 U/L (ref 9–46)
AST: 17 U/L (ref 10–35)
Albumin: 4.4 g/dL (ref 3.6–5.1)
Alkaline phosphatase (APISO): 58 U/L (ref 35–144)
BUN: 21 mg/dL (ref 7–25)
CO2: 20 mmol/L (ref 20–32)
Calcium: 10 mg/dL (ref 8.6–10.3)
Chloride: 114 mmol/L — ABNORMAL HIGH (ref 98–110)
Creat: 1.09 mg/dL (ref 0.70–1.33)
GFR, Est African American: 86 mL/min/{1.73_m2} (ref >=60)
GFR, Est Non African American: 74 mL/min/{1.73_m2} (ref >=60)
Globulin: 2.4 g/dL (calc) (ref 1.9–3.7)
Glucose, Bld: 81 mg/dL (ref 65–99)
Potassium: 4.1 mmol/L (ref 3.5–5.3)
Sodium: 143 mmol/L (ref 135–146)
Total Bilirubin: 0.4 mg/dL (ref 0.2–1.2)
Total Protein: 6.8 g/dL (ref 6.1–8.1)

## 2020-12-03 LAB — LIPID PANEL
Cholesterol: 126 mg/dL (ref <200)
HDL: 70 mg/dL (ref >=40)
LDL Cholesterol (Calc): 42 mg/dL (calc)
Non-HDL Cholesterol (Calc): 56 mg/dL (calc) (ref <130)
Total CHOL/HDL Ratio: 1.8 (calc) (ref <5.0)
Triglycerides: 55 mg/dL (ref <150)

## 2020-12-03 LAB — HIV-1 RNA QUANT-NO REFLEX-BLD
HIV 1 RNA Quant: 58 Copies/mL — ABNORMAL HIGH
HIV-1 RNA Quant, Log: 1.77 Log cps/mL — ABNORMAL HIGH

## 2020-12-03 LAB — CBC WITH DIFFERENTIAL/PLATELET
Absolute Monocytes: 447 cells/uL (ref 200–950)
Basophils Absolute: 22 cells/uL (ref 0–200)
Basophils Relative: 0.5 %
Eosinophils Absolute: 30 cells/uL (ref 15–500)
Eosinophils Relative: 0.7 %
HCT: 41.2 % (ref 38.5–50.0)
Hemoglobin: 14.4 g/dL (ref 13.2–17.1)
Lymphs Abs: 1002 cells/uL (ref 850–3900)
MCH: 36.3 pg — ABNORMAL HIGH (ref 27.0–33.0)
MCHC: 35 g/dL (ref 32.0–36.0)
MCV: 103.8 fL — ABNORMAL HIGH (ref 80.0–100.0)
MPV: 11.3 fL (ref 7.5–12.5)
Monocytes Relative: 10.4 %
Neutro Abs: 2799 cells/uL (ref 1500–7800)
Neutrophils Relative %: 65.1 %
Platelets: 135 10*3/uL — ABNORMAL LOW (ref 140–400)
RBC: 3.97 10*6/uL — ABNORMAL LOW (ref 4.20–5.80)
RDW: 14.8 % (ref 11.0–15.0)
Total Lymphocyte: 23.3 %
WBC: 4.3 10*3/uL (ref 3.8–10.8)

## 2020-12-03 LAB — RPR: RPR Ser Ql: NONREACTIVE

## 2020-12-03 MED ORDER — IOHEXOL 350 MG/ML SOLN
80.0000 mL | Freq: Once | INTRAVENOUS | Status: AC | PRN
  Administered 2020-12-03: 80 mL via INTRAVENOUS

## 2020-12-03 MED ORDER — IPRATROPIUM-ALBUTEROL 0.5-2.5 (3) MG/3ML IN SOLN
6.0000 mL | Freq: Once | RESPIRATORY_TRACT | Status: AC
  Administered 2020-12-03: 6 mL via RESPIRATORY_TRACT
  Filled 2020-12-03: qty 6

## 2020-12-03 MED ORDER — AZITHROMYCIN 250 MG PO TABS: 250.0000 mg | ORAL_TABLET | Freq: Every day | ORAL | 0 refills | Status: DC

## 2020-12-03 MED ORDER — PREDNISONE 20 MG PO TABS: 40.0000 mg | ORAL_TABLET | Freq: Every day | ORAL | 0 refills | Status: AC

## 2020-12-03 NOTE — ED Provider Notes (Signed)
I assumed care of this patient.  Please see previous provider note for further details of Hx, PE.  Briefly patient is a 60 y.o. male who presented shortness of breath with a prior history of PE.  Currently pending CTA. CTA negative for PE or pneumonia.  The patient appears reasonably screened and/or stabilized for discharge and I doubt any other medical condition or other [facility] requiring further screening, evaluation, or treatment in the ED at this time prior to discharge. Safe for discharge with strict return precautions.  Disposition: Discharge  Condition: Good  I have discussed the results, Dx and Tx plan with the patient/family who expressed understanding and agree(s) with the plan. Discharge instructions discussed at length. The patient/family was given strict return precautions who verbalized understanding of the instructions. No further questions at time of discharge.    ED Discharge Orders         Ordered    predniSONE (DELTASONE) 20 MG tablet  Daily        12/03/20 0313    azithromycin (ZITHROMAX) 250 MG tablet  Daily        12/03/20 0313          Follow Up: [facility] [place] [address] [phone] Schedule an appointment as soon as possible for a visit Call [phone] Press #6 to request an outpatient appointment  Center, [place] [address] [phone]  Call Call to see if they are accepting new patients   .       Fatima Blank, MD 12/03/20 [phone]

## 2020-12-03 NOTE — ED Notes (Addendum)
This RN went in to check on pt and start IV for CT. Pt reports that he just started feeling SOB and "his heart rate was slow" - Pt HR in the 70s and O2 was at 98%. This RN placed pt on 2L [place] for comfort. Pt stated that the oxygen was helping. Pt still reporting CP. MD notified

## 2020-12-03 NOTE — ED Notes (Signed)
Patient verbalizes understanding of discharge instructions. Opportunity for questioning and answers were provided. Armband removed by staff, pt discharged from ED ambulatory.   

## 2021-02-17 ENCOUNTER — Other Ambulatory Visit: Payer: Self-pay

## 2021-02-17 ENCOUNTER — Emergency Department (HOSPITAL_COMMUNITY)
Admission: EM | Admit: 2021-02-17 | Discharge: 2021-02-18 | Disposition: A | Discharge status: Other Acute Inpatient Hospital | Payer: Medicare Other | Source: Home / Self Care | Attending: Emergency Medicine | Admitting: Emergency Medicine

## 2021-02-17 ENCOUNTER — Encounter (HOSPITAL_COMMUNITY): Payer: Self-pay | Admitting: Emergency Medicine

## 2021-02-17 DIAGNOSIS — F1490 Cocaine use, unspecified, uncomplicated: Secondary | ICD-10-CM | POA: Insufficient documentation

## 2021-02-17 DIAGNOSIS — F200 Paranoid schizophrenia: Secondary | ICD-10-CM | POA: Diagnosis not present

## 2021-02-17 DIAGNOSIS — Z91040 Latex allergy status: Secondary | ICD-10-CM | POA: Insufficient documentation

## 2021-02-17 DIAGNOSIS — F1011 Alcohol abuse, in remission: Secondary | ICD-10-CM | POA: Insufficient documentation

## 2021-02-17 DIAGNOSIS — R441 Visual hallucinations: Secondary | ICD-10-CM | POA: Insufficient documentation

## 2021-02-17 DIAGNOSIS — Z01818 Encounter for other preprocedural examination: Secondary | ICD-10-CM | POA: Insufficient documentation

## 2021-02-17 DIAGNOSIS — Z79899 Other long term (current) drug therapy: Secondary | ICD-10-CM | POA: Insufficient documentation

## 2021-02-17 DIAGNOSIS — Z21 Asymptomatic human immunodeficiency virus [HIV] infection status: Secondary | ICD-10-CM | POA: Insufficient documentation

## 2021-02-17 DIAGNOSIS — I10 Essential (primary) hypertension: Secondary | ICD-10-CM | POA: Insufficient documentation

## 2021-02-17 DIAGNOSIS — Z7901 Long term (current) use of anticoagulants: Secondary | ICD-10-CM | POA: Insufficient documentation

## 2021-02-17 DIAGNOSIS — Z046 Encounter for general psychiatric examination, requested by authority: Secondary | ICD-10-CM | POA: Insufficient documentation

## 2021-02-17 DIAGNOSIS — R440 Auditory hallucinations: Secondary | ICD-10-CM | POA: Insufficient documentation

## 2021-02-17 DIAGNOSIS — R45851 Suicidal ideations: Secondary | ICD-10-CM

## 2021-02-17 DIAGNOSIS — F3131 Bipolar disorder, current episode depressed, mild: Secondary | ICD-10-CM | POA: Insufficient documentation

## 2021-02-17 DIAGNOSIS — Y900 Blood alcohol level of less than 20 mg/100 ml: Secondary | ICD-10-CM | POA: Insufficient documentation

## 2021-02-17 DIAGNOSIS — Z85038 Personal history of other malignant neoplasm of large intestine: Secondary | ICD-10-CM | POA: Insufficient documentation

## 2021-02-17 DIAGNOSIS — E039 Hypothyroidism, unspecified: Secondary | ICD-10-CM | POA: Insufficient documentation

## 2021-02-17 DIAGNOSIS — Z20822 Contact with and (suspected) exposure to covid-19: Secondary | ICD-10-CM | POA: Insufficient documentation

## 2021-02-17 DIAGNOSIS — F17210 Nicotine dependence, cigarettes, uncomplicated: Secondary | ICD-10-CM | POA: Insufficient documentation

## 2021-02-17 LAB — COMPREHENSIVE METABOLIC PANEL
ALT: 19 U/L (ref 0–44)
AST: 19 U/L (ref 15–41)
Albumin: 4.1 g/dL (ref 3.5–5.0)
Alkaline Phosphatase: 52 U/L (ref 38–126)
Anion gap: 7 (ref 5–15)
BUN: 20 mg/dL (ref 6–20)
CO2: 24 mmol/L (ref 22–32)
Calcium: 9.7 mg/dL (ref 8.9–10.3)
Chloride: 112 mmol/L — ABNORMAL HIGH (ref 98–111)
Creatinine, Ser: 0.78 mg/dL (ref 0.61–1.24)
GFR, Estimated: >60 mL/min (ref >60)
Glucose, Bld: 88 mg/dL (ref 70–99)
Potassium: 3.4 mmol/L — ABNORMAL LOW (ref 3.5–5.1)
Sodium: 143 mmol/L (ref 135–145)
Total Bilirubin: 0.6 mg/dL (ref 0.3–1.2)
Total Protein: 7.1 g/dL (ref 6.5–8.1)

## 2021-02-17 LAB — CBC WITH DIFFERENTIAL/PLATELET
Abs Immature Granulocytes: 0 10*3/uL (ref 0.00–0.07)
Basophils Absolute: 0 10*3/uL (ref 0.0–0.1)
Basophils Relative: 0 %
Eosinophils Absolute: 0.1 10*3/uL (ref 0.0–0.5)
Eosinophils Relative: 2 %
HCT: 40.7 % (ref 39.0–52.0)
Hemoglobin: 13.9 g/dL (ref 13.0–17.0)
Immature Granulocytes: 0 %
Lymphocytes Relative: 27 %
Lymphs Abs: 1.1 10*3/uL (ref 0.7–4.0)
MCH: 35.5 pg — ABNORMAL HIGH (ref 26.0–34.0)
MCHC: 34.2 g/dL (ref 30.0–36.0)
MCV: 104.1 fL — ABNORMAL HIGH (ref 80.0–100.0)
Monocytes Absolute: 0.6 10*3/uL (ref 0.1–1.0)
Monocytes Relative: 15 %
Neutro Abs: 2.3 10*3/uL (ref 1.7–7.7)
Neutrophils Relative %: 56 %
Platelets: 132 10*3/uL — ABNORMAL LOW (ref 150–400)
RBC: 3.91 MIL/uL — ABNORMAL LOW (ref 4.22–5.81)
RDW: 13.5 % (ref 11.5–15.5)
WBC: 4 10*3/uL (ref 4.0–10.5)
nRBC: 0 % (ref 0.0–0.2)

## 2021-02-17 LAB — RAPID URINE DRUG SCREEN, HOSP PERFORMED
Amphetamines: NOT DETECTED
Barbiturates: NOT DETECTED
Benzodiazepines: NOT DETECTED
Cocaine: NOT DETECTED
Opiates: NOT DETECTED
Tetrahydrocannabinol: NOT DETECTED

## 2021-02-17 LAB — RESP PANEL BY RT-PCR (FLU A&B, COVID) ARPGX2
Influenza A by PCR: NEGATIVE
Influenza B by PCR: NEGATIVE
SARS Coronavirus 2 by RT PCR: NEGATIVE

## 2021-02-17 LAB — ETHANOL: Alcohol, Ethyl (B): <10 mg/dL (ref <10)

## 2021-02-17 MED ORDER — LORAZEPAM 2 MG/ML IJ SOLN: 0.0000 mg | Freq: Four times a day (QID) | INTRAMUSCULAR | Status: DC

## 2021-02-17 MED ORDER — THIAMINE HCL 100 MG PO TABS
100.0000 mg | ORAL_TABLET | Freq: Every day | ORAL | Status: DC
  Administered 2021-02-17 – 2021-02-18 (×2): 100 mg via ORAL
  Filled 2021-02-17 (×2): qty 1

## 2021-02-17 MED ORDER — THIAMINE HCL 100 MG/ML IJ SOLN: 100.0000 mg | Freq: Every day | INTRAMUSCULAR | Status: DC

## 2021-02-17 MED ORDER — LORAZEPAM 1 MG PO TABS: 0.0000 mg | ORAL_TABLET | Freq: Two times a day (BID) | ORAL | Status: DC

## 2021-02-17 MED ORDER — LORAZEPAM 1 MG PO TABS
0.0000 mg | ORAL_TABLET | Freq: Four times a day (QID) | ORAL | Status: DC
  Administered 2021-02-17 (×2): 1 mg via ORAL
  Filled 2021-02-17 (×2): qty 1

## 2021-02-17 MED ORDER — LORAZEPAM 2 MG/ML IJ SOLN: 0.0000 mg | Freq: Two times a day (BID) | INTRAMUSCULAR | Status: DC

## 2021-02-17 NOTE — ED Provider Notes (Signed)
[facility] DEPT Provider Note   CSN: [id number] Arrival date & time: 02/17/21  1332     History Chief Complaint  Patient presents with  . Medical Clearance    [name] Herring is a 60 y.o. male.  HPI   Patient with history of bipolar and schizophrenia presents due to IVC. He states he is not eating, drinking or taking care of his home. States he is medically compliant but he is drinking alcohol - a fifth a day of liquor. He is having hallucination of dead people every day. He state she hears voices that tell him to hurt himself, but not to hurt others. He does not have a plan.  Past Medical History:  Diagnosis Date  . Depression   . HIV (human immunodeficiency virus infection) ([place])   . Hypertension   . Immune deficiency disorder ([place])   . PE (pulmonary thromboembolism) ([place]) 2018  . Personality disorder ([place])   . Schizophrenia [facility])     Patient Active Problem List   Diagnosis Date Noted  . AKI (acute kidney injury) ([place]) 01/15/2020  . History of pulmonary embolism 01/15/2020  . Genital warts 10/16/2019  . GERD with esophagitis 01/22/2019  . Protein C deficiency ([place]) 07/05/2017  . Benign neoplasm of transverse colon   . Emphysema lung ([place]) 11/20/2016  . DVT of lower extremity, bilateral ([place]) 11/04/2016  . Tobacco abuse 11/02/2016  . PE (pulmonary thromboembolism) ([place]) 11/02/2016  . HTN (hypertension) 08/25/2015  . Alcohol use disorder, severe, in early remission ([place]) 12/27/2014  . Human immunodeficiency virus (HIV) disease ([place]) 11/09/2006  . CA IN SITU, RECTUM 11/09/2006  . Hypothyroidism 11/09/2006  . Paranoid schizophrenia ([place]) 11/09/2006  . DISORDER, BIPOLAR NOS 11/09/2006  . Depression 11/09/2006    Past Surgical History:  Procedure Laterality Date  . COLONOSCOPY WITH PROPOFOL N/A 06/15/2017   Procedure: COLONOSCOPY WITH PROPOFOL;  Surgeon: Irene Shipper, MD;  Location: [facility] ENDOSCOPY;  Service: Endoscopy;  Laterality: N/A;   . ESOPHAGOGASTRODUODENOSCOPY (EGD) WITH PROPOFOL N/A 06/15/2017   Procedure: ESOPHAGOGASTRODUODENOSCOPY (EGD) WITH PROPOFOL;  Surgeon: Irene Shipper, MD;  Location: [facility] ENDOSCOPY;  Service: Endoscopy;  Laterality: N/A;  . IR GENERIC HISTORICAL  11/04/2016   IR IVC FILTER PLMT / S&I Burke Keels GUID/MOD SED 11/04/2016 Aletta Edouard, MD [facility]-INTERV RAD       Family History  Problem Relation Age of Onset  . CAD Mother   . Kidney disease Sister   . Lupus Brother   . Cancer Maternal Grandmother   . Stroke Paternal Grandmother     Social History   Tobacco Use  . Smoking status: Current Every Day Smoker    Packs/day: 0.50    Years: 33.00    Pack years: 16.50    Types: Cigarettes    Last attempt to quit: 04/25/2019    Years since quitting: 1.8  . Smokeless tobacco: Never Used  Vaping Use  . Vaping Use: Never used  Substance Use Topics  . Alcohol use: Yes    Comment: states once a month  . Drug use: Not Currently    Comment: Patient denies use, but UDS + cocaine    Home Medications Prior to Admission medications   Medication Sig Start Date End Date Taking? Authorizing Provider  acetaminophen (TYLENOL) 500 MG tablet Take 1 tablet (500 mg total) by mouth every 6 (six) hours as needed for moderate pain. Patient taking differently: Take 1,000 mg by mouth every 6 (six) hours as needed for moderate pain.  11/07/16   Eugenie Filler, MD  amLODipine (NORVASC) 10 MG tablet Take 1 tablet (10 mg total) by mouth daily. 01/20/20   Elgergawy, Silver Huguenin, MD  apixaban (ELIQUIS) 5 MG TABS tablet Take 1 tablet (5 mg total) by mouth 2 (two) times daily. 06/15/20   [place] Callas, NP  azithromycin (ZITHROMAX) 250 MG tablet Take 1 tablet (250 mg total) by mouth daily. Take first 2 tablets together, then 1 every day until finished. 12/03/20   Fatima Blank, MD  benztropine (COGENTIN) 0.5 MG tablet Take 0.5 mg by mouth 2 (two) times daily. 12/27/19   [provider]  benztropine (COGENTIN) 1 MG  tablet 1 tab in the morning 2 tabs at night 11/10/16   Freeman Caldron M, PA-C  bictegravir-emtricitabine-tenofovir AF (BIKTARVY) 50-200-25 MG TABS tablet Take 1 tablet by mouth daily. 05/21/20   [place] Callas, NP  butalbital-acetaminophen-caffeine (ESGIC) [phone] MG tablet Take 1 tablet by mouth 2 (two) times daily as needed for headache. 02/26/18   Charlott Rakes, MD  feeding supplement, ENSURE ENLIVE, (ENSURE ENLIVE) LIQD Take 237 mLs by mouth 3 (three) times daily between meals. 01/19/20   Elgergawy, Silver Huguenin, MD  fluticasone (FLONASE) 50 MCG/ACT nasal spray Place 1-2 sprays into both nostrils daily as needed for rhinitis. 11/01/19   [provider]  gabapentin (NEURONTIN) 800 MG tablet Take 800 mg by mouth at bedtime. 10/03/19   [provider]  lisinopril (ZESTRIL) 40 MG tablet Take 40 mg by mouth daily. 07/30/19   [provider]  magnesium oxide (MAG-OX) 400 (241.3 Mg) MG tablet Take 1 tablet (400 mg total) by mouth 2 (two) times daily. 11/07/16   Eugenie Filler, MD  Melatonin 3-10 MG TABS Take 1 tablet by mouth at bedtime. 11/27/19   [provider]  metoprolol tartrate (LOPRESSOR) 25 MG tablet Take 1 tablet (25 mg total) by mouth 2 (two) times daily. 08/07/18   Charlott Rakes, MD  mirtazapine (REMERON) 30 MG tablet Take 30 mg by mouth at bedtime. 12/27/19   [provider]  montelukast (SINGULAIR) 10 MG tablet Take 10 mg by mouth daily. 10/02/19   [provider]  naltrexone (DEPADE) 50 MG tablet Take 50 mg by mouth daily. 12/27/19   [provider]  oxyCODONE-acetaminophen (PERCOCET/ROXICET) 5-325 MG tablet Take 1 tablet by mouth every 6 (six) hours as needed for severe pain. 01/21/20   Long, Wonda Olds, MD  pantoprazole (PROTONIX) 40 MG tablet Take 40 mg by mouth daily.  05/29/19   [provider]  polyethylene glycol (MIRALAX / GLYCOLAX) packet Take 17 g by mouth daily as needed for moderate constipation.     [provider]  QUEtiapine (SEROQUEL) 400 MG tablet Take 0.5 tablets (200 mg total) by mouth at bedtime. 01/19/20   Elgergawy, Silver Huguenin, MD  RISPERDAL 1 MG tablet Take 1-2 mg by mouth See admin instructions. Take 1mg  in the morning, and 2mg  at bedtime 01/13/20   [provider]  SYMBICORT 160-4.5 MCG/ACT inhaler Inhale 2 puffs into the lungs 2 (two) times daily. 10/03/19   [provider]  tamsulosin (FLOMAX) 0.4 MG CAPS capsule Take 1 capsule (0.4 mg total) by mouth daily after supper. 03/23/20   [place] Callas, NP  tiotropium (SPIRIVA HANDIHALER) 18 MCG inhalation capsule Inhale the contents of 1 capsule via handihaler every day 10/01/20   Sonia Side., FNP  traZODone (DESYREL) 50 MG tablet Take 1 tablet (50 mg total) by mouth  at bedtime. 08/07/18   Charlott Rakes, MD  Valbenazine Tosylate (INGREZZA) 80 MG CAPS Take 80 mg by mouth 2 (two) times daily.     [provider]  cetirizine (ZYRTEC) 10 MG tablet Take 1 tablet (10 mg total) by mouth daily. Patient not taking: Reported on 10/22/2019 02/26/18 11/15/19  Charlott Rakes, MD  haloperidol (HALDOL) 10 MG tablet Take 0.5 tablets (5 mg total) by mouth 2 (two) times daily. Patient not taking: Reported on 10/22/2019 06/18/17 11/15/19  Domenic Polite, MD    Allergies    Amoxicillin, Latex, Abilify [aripiprazole], and Amoxicillin  Review of Systems   Review of Systems  Constitutional: Positive for appetite change. Negative for chills and fever.  HENT: Negative for ear pain and sore throat.   Eyes: Negative for pain and visual disturbance.  Respiratory: Negative for cough and shortness of breath.   Cardiovascular: Negative for chest pain and palpitations.  Gastrointestinal: Negative for abdominal pain and vomiting.  Genitourinary: Negative for dysuria and hematuria.  Musculoskeletal: Negative for arthralgias and back pain.  Skin: Negative for color change and rash.  Neurological: Negative for seizures and syncope.   Psychiatric/Behavioral: Positive for behavioral problems, hallucinations and suicidal ideas.  All other systems reviewed and are negative.   Physical Exam Updated Vital Signs BP (!) 138/107   Pulse 68   Temp 98.3 F (36.8 C) (Oral)   Resp 16   SpO2 97%   Physical Exam Vitals and nursing note reviewed. Exam conducted with a chaperone present.  Constitutional:      General: He is not in acute distress.    Appearance: Normal appearance.     Comments: Patient resting comfortably.   HENT:     Head: Normocephalic and atraumatic.  Eyes:     General: No scleral icterus.    Extraocular Movements: Extraocular movements intact.     Pupils: Pupils are equal, round, and reactive to light.  Cardiovascular:     Rate and Rhythm: Normal rate.     Pulses: Normal pulses.     Heart sounds: Normal heart sounds.  Skin:    Coloration: Skin is not jaundiced.  Neurological:     Mental Status: He is alert. Mental status is at baseline.     Coordination: Coordination normal.  Psychiatric:        Attention and Perception: He perceives auditory and visual hallucinations.        Mood and Affect: Affect is blunt.        Behavior: Behavior is cooperative.        Thought Content: Thought content does not include suicidal ideation. Thought content does not include homicidal or suicidal plan.     ED Results / Procedures / Treatments   Labs (all labs ordered are listed, but only abnormal results are displayed) Labs Reviewed - No data to display  EKG None  Radiology No results found.  Procedures Procedures   Medications Ordered in ED Medications - No data to display  ED Course  I have reviewed the triage vital signs and the nursing notes.  Pertinent labs & imaging results that were available during my care of the patient were reviewed by me and considered in my medical decision making (see chart for details).    MDM Rules/Calculators/A&P                         Patient presents due to  IVC. Vitals stable, patient is nontoxic appearing. Initiated Psych workup.  Drug screen and labs came back without concerning findings.   Patient is medically cleared for psych disposition.   Final Clinical Impression(s) / ED Diagnoses Final diagnoses:  None    Rx / DC Orders ED Discharge Orders    None       Sherrill Raring, PA-C 02/17/21 1654    Fredia Sorrow, MD 02/17/21 [phone]

## 2021-02-17 NOTE — BH Assessment (Signed)
Clinician made contact with pt's team at [facility] inquiring if pt can be moved to a room with the Tele-Assessment machine to complete pt's MH Assessment. Pt's tech, Antacia NT, shared she would attempt to get pt moved to a room. At [facility] clinician stated she was going to see another pt and to please let the TTS team know when pt is ready to be seen.

## 2021-02-17 NOTE — ED Triage Notes (Signed)
Patient presents due to IVC. Sister is concerned the patient is not taking his medication for bipolar and schizophrenia, not eating and not taking care of his home.

## 2021-02-17 NOTE — ED Notes (Signed)
One bag of personal belongings placed in the cabinet behind the nurses station (RM 23 -25, Hall C).

## 2021-02-18 ENCOUNTER — Other Ambulatory Visit: Payer: Self-pay | Admitting: Psychiatry

## 2021-02-18 ENCOUNTER — Inpatient Hospital Stay (HOSPITAL_COMMUNITY)
Admission: RE | Admit: 2021-02-18 | Discharge: 2021-02-24 | DRG: 885 | Disposition: A | Discharge status: Home / Self Care | Payer: Medicare Other | Attending: Emergency Medicine | Admitting: Emergency Medicine

## 2021-02-18 DIAGNOSIS — Z79899 Other long term (current) drug therapy: Secondary | ICD-10-CM

## 2021-02-18 DIAGNOSIS — J449 Chronic obstructive pulmonary disease, unspecified: Secondary | ICD-10-CM | POA: Diagnosis present

## 2021-02-18 DIAGNOSIS — Z20822 Contact with and (suspected) exposure to covid-19: Secondary | ICD-10-CM | POA: Diagnosis present

## 2021-02-18 DIAGNOSIS — F205 Residual schizophrenia: Secondary | ICD-10-CM | POA: Diagnosis not present

## 2021-02-18 DIAGNOSIS — F200 Paranoid schizophrenia: Secondary | ICD-10-CM | POA: Diagnosis present

## 2021-02-18 DIAGNOSIS — Z7901 Long term (current) use of anticoagulants: Secondary | ICD-10-CM

## 2021-02-18 DIAGNOSIS — F17210 Nicotine dependence, cigarettes, uncomplicated: Secondary | ICD-10-CM | POA: Diagnosis present

## 2021-02-18 DIAGNOSIS — F609 Personality disorder, unspecified: Secondary | ICD-10-CM | POA: Diagnosis present

## 2021-02-18 DIAGNOSIS — E876 Hypokalemia: Secondary | ICD-10-CM | POA: Diagnosis present

## 2021-02-18 DIAGNOSIS — D696 Thrombocytopenia, unspecified: Secondary | ICD-10-CM | POA: Diagnosis present

## 2021-02-18 DIAGNOSIS — I10 Essential (primary) hypertension: Secondary | ICD-10-CM | POA: Diagnosis present

## 2021-02-18 DIAGNOSIS — F319 Bipolar disorder, unspecified: Secondary | ICD-10-CM | POA: Diagnosis present

## 2021-02-18 DIAGNOSIS — Z95828 Presence of other vascular implants and grafts: Secondary | ICD-10-CM | POA: Diagnosis not present

## 2021-02-18 DIAGNOSIS — K219 Gastro-esophageal reflux disease without esophagitis: Secondary | ICD-10-CM | POA: Diagnosis present

## 2021-02-18 DIAGNOSIS — Z681 Body mass index (BMI) 19 or less, adult: Secondary | ICD-10-CM

## 2021-02-18 DIAGNOSIS — R45851 Suicidal ideations: Secondary | ICD-10-CM | POA: Diagnosis present

## 2021-02-18 DIAGNOSIS — Z888 Allergy status to other drugs, medicaments and biological substances status: Secondary | ICD-10-CM

## 2021-02-18 DIAGNOSIS — G4700 Insomnia, unspecified: Secondary | ICD-10-CM | POA: Diagnosis present

## 2021-02-18 DIAGNOSIS — Z9114 Patient's other noncompliance with medication regimen: Secondary | ICD-10-CM

## 2021-02-18 DIAGNOSIS — Z881 Allergy status to other antibiotic agents status: Secondary | ICD-10-CM

## 2021-02-18 DIAGNOSIS — Z7951 Long term (current) use of inhaled steroids: Secondary | ICD-10-CM

## 2021-02-18 DIAGNOSIS — Z86718 Personal history of other venous thrombosis and embolism: Secondary | ICD-10-CM

## 2021-02-18 DIAGNOSIS — Z9151 Personal history of suicidal behavior: Secondary | ICD-10-CM

## 2021-02-18 DIAGNOSIS — E785 Hyperlipidemia, unspecified: Secondary | ICD-10-CM | POA: Diagnosis present

## 2021-02-18 DIAGNOSIS — Z8249 Family history of ischemic heart disease and other diseases of the circulatory system: Secondary | ICD-10-CM

## 2021-02-18 DIAGNOSIS — G8929 Other chronic pain: Secondary | ICD-10-CM | POA: Diagnosis present

## 2021-02-18 DIAGNOSIS — G2401 Drug induced subacute dyskinesia: Secondary | ICD-10-CM | POA: Diagnosis present

## 2021-02-18 DIAGNOSIS — Z79891 Long term (current) use of opiate analgesic: Secondary | ICD-10-CM | POA: Diagnosis not present

## 2021-02-18 DIAGNOSIS — Z7141 Alcohol abuse counseling and surveillance of alcoholic: Secondary | ICD-10-CM

## 2021-02-18 DIAGNOSIS — R634 Abnormal weight loss: Secondary | ICD-10-CM | POA: Diagnosis present

## 2021-02-18 DIAGNOSIS — F209 Schizophrenia, unspecified: Secondary | ICD-10-CM | POA: Diagnosis present

## 2021-02-18 DIAGNOSIS — B20 Human immunodeficiency virus [HIV] disease: Secondary | ICD-10-CM | POA: Diagnosis present

## 2021-02-18 DIAGNOSIS — Z91040 Latex allergy status: Secondary | ICD-10-CM

## 2021-02-18 DIAGNOSIS — F1010 Alcohol abuse, uncomplicated: Secondary | ICD-10-CM | POA: Diagnosis present

## 2021-02-18 DIAGNOSIS — F2089 Other schizophrenia: Secondary | ICD-10-CM | POA: Diagnosis not present

## 2021-02-18 DIAGNOSIS — Z86711 Personal history of pulmonary embolism: Secondary | ICD-10-CM

## 2021-02-18 MED ORDER — FLUTICASONE PROPIONATE 50 MCG/ACT NA SUSP: 1.0000 | Freq: Every day | NASAL | Status: DC | PRN

## 2021-02-18 MED ORDER — NALTREXONE HCL 50 MG PO TABS
50.0000 mg | ORAL_TABLET | Freq: Every day | ORAL | Status: DC
  Administered 2021-02-18: 50 mg via ORAL
  Filled 2021-02-18: qty 1

## 2021-02-18 MED ORDER — PANTOPRAZOLE SODIUM 40 MG PO TBEC
40.0000 mg | DELAYED_RELEASE_TABLET | Freq: Every day | ORAL | Status: DC
  Administered 2021-02-18: 40 mg via ORAL
  Filled 2021-02-18: qty 1

## 2021-02-18 MED ORDER — CLONIDINE HCL 0.1 MG PO TABS
0.1000 mg | ORAL_TABLET | Freq: Once | ORAL | Status: AC
  Administered 2021-02-18: 0.1 mg via ORAL
  Filled 2021-02-18: qty 1

## 2021-02-18 MED ORDER — MAGNESIUM OXIDE -MG SUPPLEMENT 400 (240 MG) MG PO TABS
400.0000 mg | ORAL_TABLET | Freq: Two times a day (BID) | ORAL | Status: DC
  Administered 2021-02-18 (×2): 400 mg via ORAL
  Filled 2021-02-18 (×2): qty 1

## 2021-02-18 MED ORDER — APIXABAN 5 MG PO TABS
5.0000 mg | ORAL_TABLET | Freq: Two times a day (BID) | ORAL | Status: DC
  Administered 2021-02-18 (×2): 5 mg via ORAL
  Filled 2021-02-18 (×2): qty 1

## 2021-02-18 MED ORDER — QUETIAPINE FUMARATE 100 MG PO TABS
200.0000 mg | ORAL_TABLET | Freq: Every day | ORAL | Status: DC
  Administered 2021-02-18: 200 mg via ORAL
  Filled 2021-02-18: qty 2

## 2021-02-18 MED ORDER — LISINOPRIL 20 MG PO TABS
40.0000 mg | ORAL_TABLET | Freq: Every day | ORAL | Status: DC
  Administered 2021-02-18: 40 mg via ORAL
  Filled 2021-02-18: qty 2

## 2021-02-18 MED ORDER — GABAPENTIN 400 MG PO CAPS
800.0000 mg | ORAL_CAPSULE | Freq: Every day | ORAL | Status: DC
  Administered 2021-02-18: 800 mg via ORAL
  Filled 2021-02-18: qty 2

## 2021-02-18 MED ORDER — BENZTROPINE MESYLATE 0.5 MG PO TABS
0.5000 mg | ORAL_TABLET | Freq: Two times a day (BID) | ORAL | Status: DC
  Administered 2021-02-18 (×2): 0.5 mg via ORAL
  Filled 2021-02-18 (×2): qty 1

## 2021-02-18 MED ORDER — HYDRALAZINE HCL 25 MG PO TABS
25.0000 mg | ORAL_TABLET | Freq: Once | ORAL | Status: AC
  Administered 2021-02-18: 25 mg via ORAL
  Filled 2021-02-18: qty 1

## 2021-02-18 MED ORDER — AMLODIPINE BESYLATE 5 MG PO TABS
10.0000 mg | ORAL_TABLET | Freq: Every day | ORAL | Status: DC
  Administered 2021-02-18: 10 mg via ORAL
  Filled 2021-02-18: qty 2

## 2021-02-18 MED ORDER — MONTELUKAST SODIUM 10 MG PO TABS
10.0000 mg | ORAL_TABLET | Freq: Every day | ORAL | Status: DC
  Administered 2021-02-18: 10 mg via ORAL
  Filled 2021-02-18: qty 1

## 2021-02-18 MED ORDER — METOPROLOL TARTRATE 25 MG PO TABS
25.0000 mg | ORAL_TABLET | Freq: Two times a day (BID) | ORAL | Status: DC
  Administered 2021-02-18 (×2): 25 mg via ORAL
  Filled 2021-02-18 (×2): qty 1

## 2021-02-18 MED ORDER — MIRTAZAPINE 30 MG PO TABS
30.0000 mg | ORAL_TABLET | Freq: Every day | ORAL | Status: DC
  Administered 2021-02-18: 30 mg via ORAL
  Filled 2021-02-18: qty 1

## 2021-02-18 MED ORDER — BICTEGRAVIR-EMTRICITAB-TENOFOV 50-200-25 MG PO TABS
1.0000 | ORAL_TABLET | Freq: Every day | ORAL | Status: DC
  Administered 2021-02-18: 1 via ORAL
  Filled 2021-02-18: qty 1

## 2021-02-18 NOTE — BH Assessment (Addendum)
Disposition: Patient accepted to [facility] for admission by Oneida Alar, NP. The attending provider is Dr. Mallie Darting. Patient assigned to bed 301-1. Per chart review, patient's nurse Phoebe Sharps is aware of the disposition.  Nurse report [phone]. Patient to be transported to [facility] via sheriff.

## 2021-02-18 NOTE — ED Notes (Addendum)
Per [facility], pt now has a bed at [facility] 301-1. This RN attempted to call report to [facility] adult unit, but no answer by staff at this time.

## 2021-02-18 NOTE — Progress Notes (Signed)
We are working to confirm his medications but it may be impossible to confirm what he has actually been taking and how. I confirmed the medications he gets in bubble packs from two different pharmacies. Please see yellow notes below each med on the PTA med list. Many meds have no record of being filled in the past 6 months.   Thank you,  Romeo Rabon, PharmD Admin. Coordinator of Transitions of Care 02/18/2021,9:35 AM

## 2021-02-18 NOTE — BH Assessment (Addendum)
Per Wynonia Hazard , [facility] at [facility] :  Hi. looked at him earlier, but his bp is still elevated. I'm sure it's because of the etoh, however they are going to need to treat it before he comes over. Need a downward trend for bp. I'll forward this information to the AM [facility] so she can accept later this morning. Writer secured chatted patient's medical team to provide update.

## 2021-02-18 NOTE — ED Notes (Signed)
Sheriff called to transport pt to [facility] RM 301-1

## 2021-02-18 NOTE — ED Notes (Addendum)
[name] Herring, pt sister, called for an update on pt condietion. Pt provided verbal authorization to discuss his medical care with Ms. Len Childs. She says she "wants the best for the pt and to stay on top of his care."

## 2021-02-18 NOTE — ED Notes (Signed)
Patient was given his dinner tray.

## 2021-02-18 NOTE — BH Assessment (Signed)
Comprehensive Clinical Assessment (CCA) Note  02/18/2021 [name] Herring [id number]    DISPOSITION: Gave clinical report to Darrol Angel , NP  who determined Pt meets criteria for inpatient psychiatric treatment. Wynonia Hazard , [facility] [place] reviewing for an appropriate bed if  not currently available. Other facilities will be contacted for placement. Notified Dr. Ezequiel Essex and Fredonia Highland ,RN of disposition recommendation and the sitter utilization recommendation.   [place] ED from 02/17/2021 in [place] DEPT ED from 12/02/2020 in [place] ED from 11/14/2019 in [place] No Risk No Risk No Risk     The patient demonstrates the following risk factors for suicide: Chronic risk factors for suicide include: psychiatric disorder of bi polar , schizphrenia , substance use disorder, previous suicide attempts two last attempt \\12  years ago by OD and medical illness HIV. Acute risk factors for suicide include: social withdrawal/isolation. Protective factors for this patient include: positive social support. Considering these factors, the overall suicide risk at this point appears to be low. Patient is appropriate for outpatient follow up.   Pt is a 60 yo male who [facility] to [facility] via [facility]. Pt was accompanied by [employer] per IVC sister is concerned patient not taking medication for mental health or caring for himself. Pt has a history of bi polar and schizophrenia  and says he was referred for assessment by sister.  Pt reports medication compliance. Pt denies current suicidal ideation with no  plans of self harm and two past attempts one 20 years ago and one 12 years ago by OD.  Pt acknowledges multiple symptoms of Depression, including anhedonia, isolating, feelings of worthlessness & guilt, tearfulness, changes in sleep & appetite, & increased irritability.  Pt denies homicidal ideation/ history of violence. Pt reports auditory & visual hallucinations or other symptoms of psychosis. Patient is diagnosed with bi polar and schizophrenia and always hear voices.   Pt states current stressors include being alone . Patient reports living alone and becoming more and more depressed because he is isolated. Patient attends a support group for HIV but uses alcohol as a coping method.   Pt lives alone  and supports include family. Pt.denies a hx of abuse and trauma. Pt denies there is a family history of mental health . Pt is on disability for past 20 years for mental health. Pt has normal ?insight and judgment. Pt's memory is intact and denies any legal history.   Pt's OP history includes Dr.Dixon for HIV Support Group at [facility] . IP [facility] includes [name]. Last admission was at [facility].  Pt reports alcohol/ substance abuse. Patient reports ETOH abuse . Patient reports drinking 3-4 glasses of scotch daily . Last drink was 2 weeks ago and reports mild withdrawal symptoms.   MSE: Pt is casually dressed, alert, oriented x5 with normal speech and normal motor behavior. Eye contact is good. Pt's mood is depressed and affect is depressed and sad. Affect is congruent with mood. Thought process is coherent and relevant. There is no indication Pt is currently responding to internal stimuli or experiencing delusional thought content. Pt was cooperative throughout assessment.   DISPOSITION: Gave clinical report to Darrol Angel , NP  who determined Pt meets criteria for inpatient psychiatric treatment. Wynonia Hazard , [facility] [place] reviewing for an appropriate bed if  not currently available. Other facilities will be contacted for placement.  Notified Dr. Ezequiel Essex and Fredonia Highland ,RN of disposition recommendation and the sitter utilization recommendation.    Chief Complaint:  Chief Complaint  Patient presents with  . Medical Clearance   Visit  Diagnosis:Depression    CCA Screening, Triage and Referral (STR)  Patient Reported Information How did you hear about [place]? No data recorded Referral name: No data recorded Referral phone number: No data recorded  Whom do you see for routine medical problems? No data recorded Practice/Facility Name: No data recorded Practice/Facility Phone Number: No data recorded Name of Contact: No data recorded Contact Number: No data recorded Contact Fax Number: No data recorded Prescriber Name: No data recorded Prescriber Address (if known): No data recorded  What Is the Reason for Your Visit/Call Today? No data recorded How Long Has This Been Causing You Problems? No data recorded What Do You Feel Would Help You the Most Today? No data recorded  Have You Recently Been in Any Inpatient Treatment (Hospital/Detox/Crisis Center/28-Day Program)? No data recorded Name/Location of Program/Hospital:No data recorded How Long Were You There? No data recorded When Were You Discharged? No data recorded  Have You Ever Received Services From [facility] Before? No data recorded Who Do You See at [facility]? No data recorded  Have You Recently Had Any Thoughts About Hurting Yourself? No data recorded Are You Planning to Commit Suicide/Harm Yourself At This time? No data recorded  Have you Recently Had Thoughts About [place]? No data recorded Explanation: No data recorded  Have You Used Any Alcohol or Drugs in the Past 24 Hours? No data recorded How Long Ago Did You Use Drugs or Alcohol? No data recorded What Did You Use and How Much? No data recorded  Do You Currently Have a Therapist/Psychiatrist? No data recorded Name of Therapist/Psychiatrist: No data recorded  Have You Been Recently Discharged From Any Office Practice or Programs? No data recorded Explanation of Discharge From Practice/Program: No data recorded    CCA Screening Triage Referral Assessment Type of Contact: No  data recorded Is this Initial or Reassessment? No data recorded Date Telepsych consult ordered in [facility]:  No data recorded Time Telepsych consult ordered in [facility]:  No data recorded  Patient Reported Information Reviewed? No data recorded Patient Left Without Being Seen? No data recorded Reason for Not Completing Assessment: No data recorded  Collateral Involvement: No data recorded  Does Patient Have a [place]? No data recorded Name and Contact of Legal Guardian: No data recorded If Minor and Not Living with Parent(s), Who has Custody? No data recorded Is CPS involved or ever been involved? No data recorded Is APS involved or ever been involved? No data recorded  Patient Determined To Be At Risk for Harm To Self or Others Based on Review of Patient Reported Information or Presenting Complaint? No data recorded Method: No data recorded Availability of Means: No data recorded Intent: No data recorded Notification Required: No data recorded Additional Information for Danger to Others Potential: No data recorded Additional Comments for Danger to Others Potential: No data recorded Are There Guns or Other Weapons in Your Home? No data recorded Types of Guns/Weapons: No data recorded Are These Weapons Safely Secured?                            No data recorded Who Could Verify You Are Able To Have These Secured: No data recorded Do You Have any Outstanding Charges,  Pending Court Dates, Parole/Probation? No data recorded Contacted To Inform of Risk of Harm To Self or Others: No data recorded  Location of Assessment: No data recorded  Does Patient Present under Involuntary Commitment? No data recorded IVC Papers Initial File Date: No data recorded  [place] of Residence: No data recorded  Patient Currently Receiving the Following Services: No data recorded  Determination of Need: No data recorded  Options For Referral: No data recorded    CCA  Biopsychosocial Intake/Chief Complaint:  Patient presents due to IVC. Sister is concerned the patient is not taking his medication for bipolar and schizophrenia, not eating and not taking care of his home.  Current Symptoms/Problems: being alone / with drawl  from alcohol   Patient Reported Schizophrenia/Schizoaffective Diagnosis in Past: Yes   Strengths: No data recorded Preferences: No data recorded Abilities: No data recorded  Type of Services Patient Feels are Needed: medication and therapy   Initial Clinical Notes/Concerns: No data recorded  Mental Health Symptoms Depression:  Change in energy/activity; Hopelessness; Worthlessness; Increase/decrease in appetite   Duration of Depressive symptoms: Greater than two weeks   Mania:  N/A   Anxiety:   Sleep   Psychosis:  Hallucinations   Duration of Psychotic symptoms: Greater than six months   Trauma:  None   Obsessions:  N/A   Compulsions:  N/A   Inattention:  N/A   Hyperactivity/Impulsivity:  N/A   Oppositional/Defiant Behaviors:  N/A   Emotional Irregularity:  Chronic feelings of emptiness; Mood lability   Other Mood/Personality Symptoms:  No data recorded   Mental Status Exam Appearance and self-care  Stature:  Average   Weight:  Average weight   Clothing:  Casual   Grooming:  Normal   Cosmetic use:  None   Posture/gait:  Normal   Motor activity:  Not Remarkable   Sensorium  Attention:  Normal   Concentration:  Normal   Orientation:  X5   Recall/memory:  Normal   Affect and Mood  Affect:  Depressed   Mood:  Depressed   Relating  Eye contact:  Normal   Facial expression:  Depressed; Sad   Attitude toward examiner:  Cooperative   Thought and Language  Speech flow: Clear and Coherent   Thought content:  Appropriate to Mood and Circumstances   Preoccupation:  None   Hallucinations:  Auditory; Command (Comment) (Patient states he always hears voices)   Organization:  No data  recorded  [employer] of Knowledge:  Good   Intelligence:  Average   Abstraction:  Normal   Judgement:  Normal   Reality Testing:  Adequate   Insight:  Present   Decision Making:  Normal   Social Functioning  Social Maturity:  Isolates   Social Judgement:  Normal   Stress  Stressors:  Relationship; Transitions; Illness   Coping Ability:  Deficient supports   Skill Deficits:  Self-care; Interpersonal   Supports:  Family     Religion:    Leisure/Recreation: Leisure / Recreation Do You Have Hobbies?: No  Exercise/Diet: Exercise/Diet Do You Exercise?: No Have You Gained or Lost A Significant Amount of Weight in the Past Six Months?: No Do You Follow a Special Diet?: No Do You Have Any Trouble Sleeping?: No   CCA Employment/Education Employment/Work Situation: Employment / Work Situation Employment situation: On disability Why is patient on disability: mental health How long has patient been on disability: 20 years Patient's job has been impacted by current illness: No (n/a) What is the  longest time patient has a held a job?: 20 years  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  Has patient ever been in the [employer]?: No  Education: Education Is Patient Currently Attending School?: No Did Teacher, adult education From [employer]?: Yes Did Physicist, medical?: Yes What Type of College Degree Do you Have?: Education Did You Have An Individualized Education Program (IIEP): No Did You Have Any Difficulty At School?: No Patient's Education Has Been Impacted by Current Illness: No   CCA Family/Childhood History Family and Relationship History: Family history Does patient have children?: Yes  Childhood History:  Childhood History By whom was/is the patient raised?: Both parents,Mother Additional childhood history information: my mom mostly raised me. my parents were married for most of my childhood but dad disappeared alot. He  was an alcoholic but I didn't really understand that. He was always kind and funny but my mom couldn't handle it and eventually divorced him. Description of patient's relationship with caregiver when they were a child: close to mother. close to father when he was around Did patient suffer any verbal/emotional/physical/sexual abuse as a child?: No Has patient ever been sexually abused/assaulted/raped as an adolescent or adult?: No Witnessed domestic violence?: No Has patient been affected by domestic violence as an adult?: No  Child/Adolescent Assessment:     CCA Substance Use Alcohol/Drug Use: Alcohol / Drug Use Pain Medications: SEE MAR Prescriptions: SEE MAR Over the Counter: SEE MAR History of alcohol / drug use?: Yes (patient denied use, but was positive for cocaine) Longest period of sobriety (when/how long): "8 years" Negative Consequences of Use: Financial,Personal relationships Withdrawal Symptoms: Irritability,Other (Comment) Substance #1 Name of Substance 1: ALCOHOL 1 - Amount (size/oz): 3-4 GLASSES 1 - Frequency: DAILY 1 - Duration: MANY YEARS 1 - Last Use / Amount: 2 WEEKS AGO 1 - Method of Aquiring: PURCHASING 1- Route of Use: ORAL                       ASAM's:  Six Dimensions of Multidimensional Assessment  Dimension 1:  Acute Intoxication and/or Withdrawal Potential:   Dimension 1:  Description of individual's past and current experiences of substance use and withdrawal: 2  Dimension 2:  Biomedical Conditions and Complications:   Dimension 2:  Description of patient's biomedical conditions and  complications: 2  Dimension 3:  Emotional, Behavioral, or Cognitive Conditions and Complications:  Dimension 3:  Description of emotional, behavioral, or cognitive conditions and complications: 2  Dimension 4:  Readiness to Change:  Dimension 4:  Description of Readiness to Change criteria: 2  Dimension 5:  Relapse, Continued use, or Continued Problem Potential:   Dimension 5:  Relapse, continued use, or continued problem potential critiera description: 2  Dimension 6:  Recovery/Living Environment:  Dimension 6:  Recovery/Iiving environment criteria description: 2  ASAM Severity Score: ASAM's Severity Rating Score: 12  ASAM Recommended Level of Treatment: ASAM Recommended Level of Treatment: Level I Outpatient Treatment   Substance use Disorder (SUD) Substance Use Disorder (SUD)  Checklist Symptoms of Substance Use: Continued use despite persistent or recurrent social, interpersonal problems, caused or exacerbated by use,Presence of craving or strong urge to use  Recommendations for Services/Supports/Treatments: Recommendations for Services/Supports/Treatments Recommendations For Services/Supports/Treatments: Medication Management,SAIOP (Substance Abuse Intensive Outpatient Program),Peer Support,CD-IOP Intensive Chemical Dependency Program,Individual Therapy  DSM5 Diagnoses: Patient Active Problem List   Diagnosis Date Noted  . AKI (acute kidney injury) ([place]) 01/15/2020  . History of pulmonary embolism 01/15/2020  .  Genital warts 10/16/2019  . GERD with esophagitis 01/22/2019  . Protein C deficiency ([place]) 07/05/2017  . Benign neoplasm of transverse colon   . Emphysema lung ([place]) 11/20/2016  . DVT of lower extremity, bilateral ([place]) 11/04/2016  . Tobacco abuse 11/02/2016  . PE (pulmonary thromboembolism) ([place]) 11/02/2016  . HTN (hypertension) 08/25/2015  . Alcohol use disorder, severe, in early remission ([place]) 12/27/2014  . Human immunodeficiency virus (HIV) disease ([place]) 11/09/2006  . CA IN SITU, RECTUM 11/09/2006  . Hypothyroidism 11/09/2006  . Paranoid schizophrenia ([place]) 11/09/2006  . DISORDER, BIPOLAR NOS 11/09/2006  . Depression 11/09/2006    Patient Centered Plan: Patient is on the following Treatment Plan(s):  Referrals to Alternative Service(s): Referred to Alternative Service(s):   Place:   Date:   Time:    Referred to  Alternative Service(s):   Place:   Date:   Time:    Referred to Alternative Service(s):   Place:   Date:   Time:    Referred to Alternative Service(s):   Place:   Date:   Time:     [place], [place]

## 2021-02-19 ENCOUNTER — Other Ambulatory Visit: Payer: Self-pay

## 2021-02-19 ENCOUNTER — Encounter (HOSPITAL_COMMUNITY): Payer: Self-pay | Admitting: Psychiatry

## 2021-02-19 DIAGNOSIS — F205 Residual schizophrenia: Secondary | ICD-10-CM | POA: Diagnosis not present

## 2021-02-19 DIAGNOSIS — F2089 Other schizophrenia: Secondary | ICD-10-CM | POA: Diagnosis not present

## 2021-02-19 MED ORDER — RISPERIDONE 1 MG PO TABS
1.0000 mg | ORAL_TABLET | Freq: Every day | ORAL | Status: DC
  Administered 2021-02-19 – 2021-02-20 (×2): 1 mg via ORAL
  Filled 2021-02-19 (×4): qty 1

## 2021-02-19 MED ORDER — HYDROXYZINE HCL 25 MG PO TABS
25.0000 mg | ORAL_TABLET | Freq: Four times a day (QID) | ORAL | Status: AC | PRN
  Administered 2021-02-20: 25 mg via ORAL
  Filled 2021-02-19: qty 1

## 2021-02-19 MED ORDER — THIAMINE HCL 100 MG PO TABS
100.0000 mg | ORAL_TABLET | Freq: Every day | ORAL | Status: DC
  Administered 2021-02-20 – 2021-02-24 (×5): 100 mg via ORAL
  Filled 2021-02-19 (×8): qty 1

## 2021-02-19 MED ORDER — MAGNESIUM HYDROXIDE 400 MG/5ML PO SUSP: 30.0000 mL | Freq: Every day | ORAL | Status: DC | PRN

## 2021-02-19 MED ORDER — AMLODIPINE BESYLATE 5 MG PO TABS
5.0000 mg | ORAL_TABLET | Freq: Every day | ORAL | Status: DC
  Administered 2021-02-19 – 2021-02-21 (×3): 5 mg via ORAL
  Filled 2021-02-19 (×6): qty 1

## 2021-02-19 MED ORDER — QUETIAPINE FUMARATE 100 MG PO TABS
100.0000 mg | ORAL_TABLET | Freq: Every day | ORAL | Status: DC
  Administered 2021-02-19: 100 mg via ORAL
  Filled 2021-02-19 (×3): qty 1

## 2021-02-19 MED ORDER — RISPERIDONE 2 MG PO TABS
2.0000 mg | ORAL_TABLET | Freq: Every day | ORAL | Status: DC
  Administered 2021-02-19: 2 mg via ORAL
  Filled 2021-02-19 (×2): qty 1

## 2021-02-19 MED ORDER — QUETIAPINE FUMARATE 200 MG PO TABS
200.0000 mg | ORAL_TABLET | Freq: Every day | ORAL | Status: DC
  Filled 2021-02-19: qty 1

## 2021-02-19 MED ORDER — PANTOPRAZOLE SODIUM 40 MG PO TBEC
40.0000 mg | DELAYED_RELEASE_TABLET | Freq: Every day | ORAL | Status: DC
  Administered 2021-02-19 – 2021-02-24 (×6): 40 mg via ORAL
  Filled 2021-02-19 (×9): qty 1

## 2021-02-19 MED ORDER — ACETAMINOPHEN 325 MG PO TABS
650.0000 mg | ORAL_TABLET | Freq: Four times a day (QID) | ORAL | Status: DC | PRN
  Administered 2021-02-22: 650 mg via ORAL
  Filled 2021-02-19: qty 2

## 2021-02-19 MED ORDER — LORAZEPAM 1 MG PO TABS: 1.0000 mg | ORAL_TABLET | Freq: Four times a day (QID) | ORAL | Status: DC | PRN

## 2021-02-19 MED ORDER — ALUM & MAG HYDROXIDE-SIMETH 200-200-20 MG/5ML PO SUSP: 30.0000 mL | ORAL | Status: DC | PRN

## 2021-02-19 MED ORDER — TRAZODONE HCL 50 MG PO TABS
50.0000 mg | ORAL_TABLET | Freq: Every evening | ORAL | Status: DC | PRN
  Administered 2021-02-21 – 2021-02-23 (×2): 50 mg via ORAL
  Filled 2021-02-19 (×2): qty 1

## 2021-02-19 MED ORDER — CLONIDINE HCL 0.1 MG PO TABS
0.1000 mg | ORAL_TABLET | Freq: Three times a day (TID) | ORAL | Status: DC | PRN
  Administered 2021-02-19 – 2021-02-21 (×3): 0.1 mg via ORAL
  Filled 2021-02-19 (×4): qty 1

## 2021-02-19 MED ORDER — HYDROXYZINE HCL 25 MG PO TABS: 25.0000 mg | ORAL_TABLET | Freq: Three times a day (TID) | ORAL | Status: DC | PRN

## 2021-02-19 MED ORDER — LOPERAMIDE HCL 2 MG PO CAPS: 2.0000 mg | ORAL_CAPSULE | ORAL | Status: AC | PRN

## 2021-02-19 MED ORDER — VALBENAZINE TOSYLATE 40 MG PO CAPS
80.0000 mg | ORAL_CAPSULE | Freq: Every day | ORAL | Status: DC
  Administered 2021-02-19 – 2021-02-23 (×5): 80 mg via ORAL
  Filled 2021-02-19 (×7): qty 2

## 2021-02-19 MED ORDER — ATORVASTATIN CALCIUM 40 MG PO TABS
40.0000 mg | ORAL_TABLET | Freq: Every day | ORAL | Status: DC
  Administered 2021-02-19 – 2021-02-23 (×5): 40 mg via ORAL
  Filled 2021-02-19 (×7): qty 1

## 2021-02-19 MED ORDER — LISINOPRIL 40 MG PO TABS
40.0000 mg | ORAL_TABLET | Freq: Every day | ORAL | Status: DC
  Administered 2021-02-19 – 2021-02-24 (×6): 40 mg via ORAL
  Filled 2021-02-19 (×4): qty 1
  Filled 2021-02-19: qty 2
  Filled 2021-02-19 (×4): qty 1

## 2021-02-19 MED ORDER — ONDANSETRON 4 MG PO TBDP: 4.0000 mg | ORAL_TABLET | Freq: Four times a day (QID) | ORAL | Status: AC | PRN

## 2021-02-19 MED ORDER — POTASSIUM CHLORIDE CRYS ER 20 MEQ PO TBCR
20.0000 meq | EXTENDED_RELEASE_TABLET | Freq: Once | ORAL | Status: AC
  Administered 2021-02-19: 20 meq via ORAL
  Filled 2021-02-19 (×2): qty 1

## 2021-02-19 MED ORDER — APIXABAN 5 MG PO TABS
5.0000 mg | ORAL_TABLET | Freq: Two times a day (BID) | ORAL | Status: DC
  Administered 2021-02-19 – 2021-02-24 (×11): 5 mg via ORAL
  Filled 2021-02-19 (×15): qty 1

## 2021-02-19 MED ORDER — MIRTAZAPINE 30 MG PO TABS
30.0000 mg | ORAL_TABLET | Freq: Every day | ORAL | Status: DC
  Administered 2021-02-19 – 2021-02-23 (×5): 30 mg via ORAL
  Filled 2021-02-19 (×7): qty 1

## 2021-02-19 MED ORDER — MOMETASONE FURO-FORMOTEROL FUM 200-5 MCG/ACT IN AERO
2.0000 | INHALATION_SPRAY | Freq: Two times a day (BID) | RESPIRATORY_TRACT | Status: DC
  Administered 2021-02-19 – 2021-02-24 (×10): 2 via RESPIRATORY_TRACT
  Filled 2021-02-19: qty 8.8

## 2021-02-19 MED ORDER — ADULT MULTIVITAMIN W/MINERALS CH
1.0000 | ORAL_TABLET | Freq: Every day | ORAL | Status: DC
  Administered 2021-02-19 – 2021-02-23 (×5): 1 via ORAL
  Filled 2021-02-19 (×8): qty 1

## 2021-02-19 MED ORDER — BENZTROPINE MESYLATE 1 MG PO TABS
1.0000 mg | ORAL_TABLET | Freq: Two times a day (BID) | ORAL | Status: DC
  Administered 2021-02-19 – 2021-02-24 (×11): 1 mg via ORAL
  Filled 2021-02-19 (×17): qty 1

## 2021-02-19 MED ORDER — BICTEGRAVIR-EMTRICITAB-TENOFOV 50-200-25 MG PO TABS
1.0000 | ORAL_TABLET | Freq: Every day | ORAL | Status: DC
  Administered 2021-02-19 – 2021-02-24 (×6): 1 via ORAL
  Filled 2021-02-19 (×8): qty 1

## 2021-02-19 MED ORDER — GABAPENTIN 800 MG PO TABS
800.0000 mg | ORAL_TABLET | Freq: Every day | ORAL | Status: DC
  Administered 2021-02-19 – 2021-02-23 (×5): 800 mg via ORAL
  Filled 2021-02-19 (×7): qty 1

## 2021-02-19 MED ORDER — MONTELUKAST SODIUM 10 MG PO TABS
10.0000 mg | ORAL_TABLET | Freq: Every day | ORAL | Status: DC
  Administered 2021-02-19 – 2021-02-24 (×6): 10 mg via ORAL
  Filled 2021-02-19 (×8): qty 1

## 2021-02-19 NOTE — BHH Suicide Risk Assessment (Signed)
[name] INPATIENT:  Family/Significant Other Suicide Prevention Education  Suicide Prevention Education:  Education Completed; sister [name] Herring [phone],  (name of family member/significant other) has been identified by the patient as the family member/significant other with whom the patient will be residing, and identified as the person(s) who will aid the patient in the event of a mental health crisis (suicidal ideations/suicide attempt).  With written consent from the patient, the family member/significant other has been provided the following suicide prevention education, prior to the and/or following the discharge of the patient.  The suicide prevention education provided includes the following:  Suicide risk factors  Suicide prevention and interventions  National Suicide Hotline telephone number  [facility] assessment telephone number  [facility] Emergency Assistance [place] and/or Residential Mobile Crisis Unit telephone number  Request made of family/significant other to:  Remove weapons (e.g., guns, rifles, knives), all items previously/currently identified as safety concern.    Remove drugs/medications (over-the-counter, prescriptions, illicit drugs), all items previously/currently identified as a safety concern.  The family member/significant other verbalizes understanding of the suicide prevention education information provided.  The family member/significant other agrees to remove the items of safety concern listed above.  A great deal of pertinent information was shared by patient's sister, and is written up in a separate Epic note.  Of note, she believes the family has seen evidence of multiple other suicide attempts that were never reported, in addition to the two reported attempts.  He will frequently talk about feeling ready to die and being tired of being alive and alone.   [name] Herring 02/19/2021, 3:56 PM

## 2021-02-19 NOTE — BHH Counselor (Signed)
Adult Comprehensive Assessment  Patient ID: [name] Herring, male   DOB: 12-Jan-1961, 60 y.o.   MRN: [id number]  Information Source: Information source: Patient  Current Stressors:  Patient states their primary concerns and needs for treatment are:: I got voices. Patient states their goals for this hospitilization and ongoing recovery are:: Trying to get back on meds. Educational / Learning stressors: Denies Employment / Job issues: Denies, is on disability Family Relationships: Has a sister who is prison currently.  Great relationship with other family members. Financial / Lack of resources (include bankruptcy): Bills are paid, is his own Rep Payee, never pays late. Housing / Lack of housing: Lives alone, for about 3 years has felt he cannot take care of the apartment or himself.  Isolates severely except for when children visit. Physical health (include injuries & life threatening diseases): Has HIV+, "undetectable" but it takes a toll. Social relationships: Has not had a relationship in 20 years because of his HIV status. Substance abuse: Sober 22 years, no longer a stress, but that is how he got HIV+.  Gave it to his wife, which cost him his marriage. Bereavement / Loss: Mother, brothers, father -- all died in less than 2 years, never got over it.  Living/Environment/Situation:  Living Arrangements: Alone Living conditions (as described by patient or guardian): Apartment Who else lives in the home?: Alone How long has patient lived in current situation?: 7 years What is atmosphere in current home: Chaotic  Family History:  Marital status: Divorced Divorced, when?: 37 years What types of issues is patient dealing with in the relationship?: Was married 9 years.  They are good friends now. Does patient have children?: Yes How many children?: 4 How is patient's relationship with their children?: three boys and a girl. youngest is at [employer]. I have a great relationship with my kids.  They live in [place] but not close by.  For 10 years his ex-wife hid the kids from him, and this made patient think about killing himself.  Childhood History:  By whom was/is the patient raised?: Both parents,Mother Additional childhood history information: My mom mostly raised me. my parents were married for most of my childhood but dad disappeared alot. He was an alcoholic but I didn't really understand that. He was always kind and funny but my mom couldn't handle it and eventually divorced him. Description of patient's relationship with caregiver when they were a child: close to mother. close to father when he was around Patient's description of current relationship with people who raised him/her: Both parents are deceased since 59. How were you disciplined when you got in trouble as a child/adolescent?: Switch Does patient have siblings?: Yes Number of Siblings: 9 Description of patient's current relationship with siblings: older brother and younger brother-both died in 57. "I had a mental breakdown after that. I lost my whole family in just two years."  Has 4 siblings left. Did patient suffer any verbal/emotional/physical/sexual abuse as a child?: No Did patient suffer from severe childhood neglect?: No Has patient ever been sexually abused/assaulted/raped as an adolescent or adult?: No Was the patient ever a victim of a crime or a disaster?: No Witnessed domestic violence?: No Has patient been affected by domestic violence as an adult?: No  Education:  Highest grade of school patient has completed: GED Currently a Ship broker?: No Learning disability?: No  Employment/Work Situation:   Employment situation: On disability Why is patient on disability: mental health How long has patient been on disability:  20 years Patient's job has been impacted by current illness: No What is the longest time patient has a held a job?: 20 years  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  Has patient ever been in the [employer]?: No  Financial Resources:   Financial resources: Technical sales engineer Does patient have a Programmer, applications or guardian?: No  Alcohol/Substance Abuse:   What has been your use of drugs/alcohol within the last 12 months?: Alcohol a month ago Alcohol/Substance Abuse Treatment Hx: Past Tx, Inpatient If yes, describe treatment: [facility] in [place] Has alcohol/substance abuse ever caused legal problems?: No  Social Support System:   Pensions consultant Support System: Good Describe Community Support System: Son, daughter, sister Type of faith/religion: Baptist How does patient's faith help to cope with current illness?: Gives him a reason to live.  Leisure/Recreation:   Do You Have Hobbies?: No  Strengths/Needs:   What is the patient's perception of their strengths?: Heart Patient states they can use these personal strengths during their treatment to contribute to their recovery: Trying to answer that myself.  I don't trust people. Patient states these barriers may affect/interfere with their treatment: None Patient states these barriers may affect their return to the community: None Other important information patient would like considered in planning for their treatment: None  Discharge Plan:   Currently receiving community mental health services: No Patient states concerns and preferences for aftercare planning are: Interested in returning to [facility] for both medication management and therapy. Patient states they will know when they are safe and ready for discharge when: When feels safe, knowing is going into a retirement home. Does patient have access to transportation?: Yes Does patient have financial barriers related to discharge medications?: No Patient description of barriers related to discharge medications: None, has disability income and both Medicare/Medicaid Plan for living  situation after discharge: Would like to go into a retirement home.  Is very specific that he can take care of himself, but wants to be around other people. Will patient be returning to same living situation after discharge?: No  Summary/Recommendations:   Summary and Recommendations (to be completed by the evaluator): Patient is a 60yo male hospitalized under IVC with worsening depression, not taking his mental health medication, hearing voices, and a history of 2 previous suicide attempts.  Primary stressors include being alone and isolative, not caring for his apartment and thus living in chaos, and HIV+ status that has kept him from having a relationship in 20 years.  He is currently using alcohol (3-4 glasses of scotch daily) and has a history of IV drug use that led to his infection with HIV.  He is 22 years sober from those drugs, went to rehab at [facility] twice to get clean.  He is divorced but close to his ex-wife and children.  He would like to go live in a retirement home and wants [place] to be in touch with his sister [name] [phone] to discuss how to help her locate such a facility.  He used to go to [facility] for medication management and therapy and would like to return there, even knowing all services are currently provided virtually.  Patient would benefit from crisis stabilization, group therapy, medication management, psychoeducation, peer interaction and discharge planning.  At discharge it is recommended that the patient adhere to the established aftercare plan.  Maretta Los. 02/19/2021

## 2021-02-19 NOTE — [facility] Suicide Risk Assessment (Signed)
[facility] Admission Suicide Risk Assessment   Nursing information obtained from:  Patient Demographic factors:  Living alone,Unemployed Current Mental Status:  Command AH and SI Loss Factors:  Chronic health issues Historical Factors:  Prior suicide attempts Risk Reduction Factors:  Positive social support  Total Time Spent in Direct Patient Care:  I personally spent 40 minutes on the unit in direct patient care. The direct patient care time included face-to-face time with the patient, reviewing the patient's chart, communicating with other professionals, and coordinating care. Greater than 50% of this time was spent in counseling or coordinating care with the patient regarding goals of hospitalization, psycho-education, and discharge planning needs.  Principal Problem: Schizophrenia ([place]) Diagnosis:  Principal Problem:   Schizophrenia ([place]) Active Problems:   Alcohol abuse  Subjective Data: The patient is a 60y/o male with self-reported past psychiatric h/o schizophrenia and chronic TD and past medical history significant for HTN, HIV, and h/o DVT/PE with IVC filter placement in 2018, who was admitted under IVC for management of worsening command AH and SI. He was brought to the ED by [facility] due to concerns by his sister that he was not caring for himself or taking his mental health medications. The patient admits that for the last 3 months he has felt depressed and has not been eating well, has had hypersomnia, and has not been attending to his ADLs. He thinks he is supposed to be on Remeron but believes he has been off the medication about 2 months. He states that he has been compliant with his home doses of Risperdal, Ingrezza, Neurontin, and Cogentin but is vague as to his home doses. He admits to recent command [place] telling him to "hurt people" and VH of "seeing dead people." He denies current SI or HI. He denies issues with ideas of reference or first rank symptoms but admits to chronic paranoia.  Prior to admission he reports drinking "a lot" every other day but does not quantify an amount. He denies illicit drug use for the past 20 years. See H&P for additional details.   Continued Clinical Symptoms:  Alcohol Use Disorder Identification Test Final Score (AUDIT): 3 The "Alcohol Use Disorders Identification Test", Guidelines for Use in Primary Care, Second Edition.  World Pharmacologist [facility]). Score between 0-7:  no or low risk or alcohol related problems. Score between 8-15:  moderate risk of alcohol related problems. Score between 16-19:  high risk of alcohol related problems. Score 20 or above:  warrants further diagnostic evaluation for alcohol dependence and treatment.  CLINICAL FACTORS:   Schizophrenia:   Command hallucinatons Depressive state Previous Psychiatric Diagnoses and Treatments Medical Diagnoses and Treatments/Surgeries  Musculoskeletal: Gait & Station: untested, patient sitting in bed Patient leans: N/A  Psychiatric Specialty Exam: Physical Exam Vitals reviewed.  HENT:     Head: Normocephalic.  Pulmonary:     Effort: Pulmonary effort is normal.  Neurological:     Mental Status: He is alert.     Review of Systems - see admission H&P  Blood pressure (!) 166/114, pulse 64, temperature (!) 97.5 F (36.4 C), temperature source Oral, resp. rate 18, height 5\' 9"  (1.753 m), weight 59.4 kg, SpO2 99 %.Body mass index is 19.35 kg/m.  General Appearance: disheveled appearing with body odor; thin; casually dressed  Eye Contact:  good  Speech:  Clear and Coherent and Normal Rate; has intermittent teeth grinding/jaw movement on exam  Volume:  Normal  Mood:  Depressed  Affect:  Constricted  Thought Process:  Goal Directed and Linear  Orientation:  Full (Time, Place, and Person)  Thought Content:  Admits to paranoia and command AH as well as VH; denies ideas of reference or first rank symptoms  Suicidal Thoughts:  No  Homicidal Thoughts:  No  Memory:   Recent;   Fair  Judgement:  Impaired  Insight:  Lacking  Psychomotor Activity: evidence of TD with lateral jaw movement and teeth grinding on exam; no obvious tremor or involuntary movements of extremities or trunk  Concentration:  Concentration: Fair  Recall:  [employer] of Knowledge:  Fair  Language:  Good  Akathisia:  Negative  Assets:  Communication Skills Desire for Improvement Resilience  ADL's:  Impaired  Cognition:  WNL  Sleep:  Number of Hours: 5.5   COGNITIVE FEATURES THAT CONTRIBUTE TO RISK:  Thought constriction (tunnel vision)    SUICIDE RISK:   Moderate:  Given severity of depression and presence of command AH.  PLAN OF CARE: Patient admitted under IVC with 2nd opinion completed. NP to verify and restart home medications for HIV, hyperlipidemia,and prior DVT/PE. PRN Clonidine ordered additionally for HTN and patient started on Norvasc 5mg  additionally with his home Lisinopril 40mg  daily for BP control. Patient restarted prior to admission on Remeron 30mg  qhs, Ingrezza 80mg  qhs, Risperdal 1mg  qam and 2mg  qhs, Cogentin 1mg  bid, and Neurontin 800mg  qhs. We will continue to taper off Seroquel to avoid use of 2 antipsychotics and can titrate up on Risperdal as needed. He will be place on CIWA protocol for monitoring for alcohol withdrawal with oral thiamine and folate replacement and Ativan 1mg  for CIWA >1. EKG, A1c, lipid panel, and TSH pending. Admission labs reviewed: WBC 4.0 with H/H 13.9/40.7 and platelets chronically low at 132 (compared to previous levels of 135 2 months ago, 136 5 months ago, and 138 1 year ago). CMP WNL except for K+ 3.4 and Cl 112; ETOH <10; respiratory panel negative, UDS negative. Will recheck BMP tomorrow for monitoring of K+ and will see if patient received po potassium replacement prior to transfer.   I certify that inpatient services furnished can reasonably be expected to improve the patient's condition.   Harlow Asa, MD,FAPA 02/19/2021, 11:40  AM

## 2021-02-19 NOTE — [facility] Notes (Signed)
   02/19/21 0500  Sleep  Number of Hours 5.5

## 2021-02-19 NOTE — [facility] Notes (Signed)
Pt Bp elevated. Pt does not have any medications ordered. NP notified    02/19/21 0635  Vital Signs  Pulse Rate 70  Pulse Rate Source Monitor  BP (!) 146/113  BP Location Right Arm  BP Method Automatic  Patient Position (if appropriate) Standing  Oxygen Therapy  SpO2 100 %

## 2021-02-19 NOTE — H&P (Addendum)
Psychiatric Admission Assessment Adult  Patient Identification: [name] Herring MRN:  [id number] Date of Evaluation:  02/19/2021 Chief Complaint:  Schizophrenia ([place]) [F20.9] Paranoid schizophrenia ([place]) [F20.0] Principal Diagnosis: <principal problem not specified> Diagnosis:  Active Problems:   Paranoid schizophrenia ([place])   Schizophrenia ([place])  History of Present Illness: AA Male, 60 years old seen this morning for admission assessment.  Patient was admitted from the ER yesterday evening under IVC for treatment of Schizophrenia and medication non compliance.   Past Psychiatric hx includes Schizophrenia, Bipolar disorder,substance abuse, Alcohol abuse. Patient was brought by [facility] for lack of care, not taking medications.    His sister was concerned about him and made the referral.  Patient has had previous multiple inpatient Psychiatric hospitalizations including hospitalizations at [facility].  Today patient was seen for assessment and he was able to participate in the interview.  Patient stated that he has not been able to care for himself.  His family members were concerned about him because he has not been cleaning his apartment, not eating or taking care of his ADLS which is nor his usual behavior.  Patient looks older than stated age and emaciated.  He admitted that he is depressed and rated his depression 8/10 with 10 being severe depression.  He reported isolating, poor appetite, hypersomnia, feelings of hopelessness and helplessness.  Patient reported hearing voices commanding in nature.  The voices are telling him to harm people but he stated that he has never tried to harm people.  He admitted to several suicide attempts by OD and last time was 2017.  His stressors includes living alone and feeling lonely.  He is HIV positive and stated he contracted HIV through IV drug use.  He is currently not seeing outpatient Psychiatrist but gets his medications from [facility].  Patient reports 8 hours or  more sleep, eats three times a day but he also admits tremendous weight loss but unable to quantify loss.  He stated that drinking alcohol prevents him from eating.  He drinks first week of the month when he receives his SS Check.  He get a bottle of Liquor and once that is done he stops drinking until his next check.  Patient reported last drink was two weeks ago.  He denies drug and tobacco use.  Today he denies feeling suicidal and but continues to hear voices.  His blood pressure is elevated and we have added Clonidine to be offered as needed for elevated BP and he is on Lisinopril for BP control.  We will add Amlodipine in addition to Lisinopril.  Medication review and changes are ongoing. Associated Signs/Symptoms: Depression Symptoms:  depressed mood, anhedonia, hypersomnia, psychomotor retardation, fatigue, feelings of worthlessness/guilt, hopelessness, loss of energy/fatigue, weight loss, decreased appetite, Duration of Depression Symptoms: Greater than two weeks  (Hypo) Manic Symptoms:  Hallucinations, Anxiety Symptoms:  na Psychotic Symptoms:  Hallucinations: Auditory Command:  Voices are telling him to harm people. PTSD Symptoms: NA Total Time spent with patient: 50 minutes  Past Psychiatric History: Bipolar disorder, Schizophrenia, Substance abuse, Alcohol abuse, Suicide attempts.  Is the patient at risk to self? No.  Has the patient been a risk to self in the past 6 months? No.  Has the patient been a risk to self within the distant past? No.  Is the patient a risk to others? No.  Has the patient been a risk to others in the past 6 months? No.  Has the patient been a risk to others within the  distant past? No.   Prior Inpatient Therapy:   Prior Outpatient Therapy:    Alcohol Screening: 1. How often do you have a drink containing alcohol?: 2 to 4 times a month 2. How many drinks containing alcohol do you have on a typical day when you are drinking?: 3 or 4 3. How  often do you have six or more drinks on one occasion?: Never AUDIT-C Score: 3 4. How often during the last year have you found that you were not able to stop drinking once you had started?: Never 5. How often during the last year have you failed to do what was normally expected from you because of drinking?: Never 6. How often during the last year have you needed a first drink in the morning to get yourself going after a heavy drinking session?: Never 7. How often during the last year have you had a feeling of guilt of remorse after drinking?: Never 8. How often during the last year have you been unable to remember what happened the night before because you had been drinking?: Never 9. Have you or someone else been injured as a result of your drinking?: No 10. Has a relative or friend or a doctor or another health worker been concerned about your drinking or suggested you cut down?: No Alcohol Use Disorder Identification Test Final Score (AUDIT): 3 Substance Abuse History in the last 12 months:  Yes.   Consequences of Substance Abuse: Medical Consequences:  Contracted HIV from injecting drugs-Cocaine Previous Psychotropic Medications: Yes  Psychological Evaluations: Yes  Past Medical History:  Past Medical History:  Diagnosis Date  . Depression   . HIV (human immunodeficiency virus infection) ([place])   . Hypertension   . Immune deficiency disorder ([place])   . PE (pulmonary thromboembolism) ([place]) 2018  . Personality disorder ([place])   . Schizophrenia [facility])     Past Surgical History:  Procedure Laterality Date  . COLONOSCOPY WITH PROPOFOL N/A 06/15/2017   Procedure: COLONOSCOPY WITH PROPOFOL;  Surgeon: Irene Shipper, MD;  Location: [facility] ENDOSCOPY;  Service: Endoscopy;  Laterality: N/A;  . ESOPHAGOGASTRODUODENOSCOPY (EGD) WITH PROPOFOL N/A 06/15/2017   Procedure: ESOPHAGOGASTRODUODENOSCOPY (EGD) WITH PROPOFOL;  Surgeon: Irene Shipper, MD;  Location: [facility] ENDOSCOPY;  Service: Endoscopy;  Laterality:  N/A;  . IR GENERIC HISTORICAL  11/04/2016   IR IVC FILTER PLMT / S&I Burke Keels GUID/MOD SED 11/04/2016 Aletta Edouard, MD [facility]-INTERV RAD   Family History:  Family History  Problem Relation Age of Onset  . CAD Mother   . Kidney disease Sister   . Lupus Brother   . Cancer Maternal Grandmother   . Stroke Paternal Grandmother    Family Psychiatric  History: Unknown by patient Tobacco Screening: Have you used any form of tobacco in the last 30 days? (Cigarettes, Smokeless Tobacco, Cigars, and/or Pipes): Yes Tobacco use, Select all that apply: 5 or more cigarettes per day Are you interested in Tobacco Cessation Medications?: Yes, will notify MD for an order Counseled patient on smoking cessation including recognizing danger situations, developing coping skills and basic information about quitting provided: Refused/Declined practical counseling Social History:  Social History   Substance and Sexual Activity  Alcohol Use Yes   Comment: states once a month     Social History   Substance and Sexual Activity  Drug Use Not Currently   Comment: Patient denies use, but UDS + cocaine    Additional Social History:  Allergies:   Allergies  Allergen Reactions  . Amoxicillin Shortness Of Breath and Rash    Has patient had a PCN reaction causing immediate rash, facial/tongue/throat swelling, [name] Has patient had a PCN reaction causing severe rash involving mucus membranes or skin necrosis: no Has patient had a PCN reaction that required hospitalization: yes drs office visit Has patient had a PCN reaction occurring within the last 10 years: no If all of the above answers are "NO", then may proceed with Cephalosporin use.   . Latex Shortness Of Breath  . Abilify [Aripiprazole] Other (See Comments)    Double vision   Lab Results:  Results for orders placed or performed during the hospital encounter of 02/17/21 (from the  past 48 hour(s))  CBC with Differential     Status: Abnormal   Collection Time: 02/17/21  2:02 PM  Result Value Ref Range   WBC 4.0 4.0 - 10.5 K/uL   RBC 3.91 (L) 4.22 - 5.81 MIL/uL   Hemoglobin 13.9 13.0 - 17.0 g/dL   HCT 40.7 39.0 - 52.0 %   MCV 104.1 (H) 80.0 - 100.0 fL   MCH 35.5 (H) 26.0 - 34.0 pg   MCHC 34.2 30.0 - 36.0 g/dL   RDW 13.5 11.5 - 15.5 %   Platelets 132 (L) 150 - 400 K/uL   nRBC 0.0 0.0 - 0.2 %   Neutrophils Relative % 56 %   Neutro Abs 2.3 1.7 - 7.7 K/uL   Lymphocytes Relative 27 %   Lymphs Abs 1.1 0.7 - 4.0 K/uL   Monocytes Relative 15 %   Monocytes Absolute 0.6 0.1 - 1.0 K/uL   Eosinophils Relative 2 %   Eosinophils Absolute 0.1 0.0 - 0.5 K/uL   Basophils Relative 0 %   Basophils Absolute 0.0 0.0 - 0.1 K/uL   Immature Granulocytes 0 %   Abs Immature Granulocytes 0.00 0.00 - 0.07 K/uL    Comment: Performed at [facility], [place] [address] [place], [place] [address]  Comprehensive metabolic panel     Status: Abnormal   Collection Time: 02/17/21  2:02 PM  Result Value Ref Range   Sodium 143 135 - 145 mmol/L   Potassium 3.4 (L) 3.5 - 5.1 mmol/L   Chloride 112 (H) 98 - 111 mmol/L   CO2 24 22 - 32 mmol/L   Glucose, Bld 88 70 - 99 mg/dL    Comment: Glucose reference range applies only to samples taken after fasting for at least 8 hours.   BUN 20 6 - 20 mg/dL   Creatinine, Ser 0.78 0.61 - 1.24 mg/dL   Calcium 9.7 8.9 - 10.3 mg/dL   Total Protein 7.1 6.5 - 8.1 g/dL   Albumin 4.1 3.5 - 5.0 g/dL   AST 19 15 - 41 U/L   ALT 19 0 - 44 U/L   Alkaline Phosphatase 52 38 - 126 U/L   Total Bilirubin 0.6 0.3 - 1.2 mg/dL   GFR, Estimated >60 >60 mL/min    Comment: (NOTE) Calculated using the CKD-EPI Creatinine Equation (2021)    Anion gap 7 5 - 15    Comment: Performed at [facility], [place] [address] [place], [place] [address]  Ethanol     Status: None   Collection Time: 02/17/21  2:02 PM  Result Value Ref Range   Alcohol,  Ethyl (B) <10 <10 mg/dL    Comment: (NOTE) Lowest detectable limit for serum alcohol is 10 mg/dL.  For medical purposes only. Performed at [facility], [place] [address], [place], [place] [address]   Resp Panel by RT-PCR (Flu A&B, Covid) Nasopharyngeal Swab     Status: None   Collection Time: 02/17/21  2:14 PM   Specimen: Nasopharyngeal Swab; Nasopharyngeal(NP) swabs in vial transport medium  Result Value Ref Range   SARS Coronavirus 2 by RT PCR NEGATIVE NEGATIVE    Comment: (NOTE) SARS-CoV-2 target nucleic acids are NOT DETECTED.  The SARS-CoV-2 RNA is generally detectable in upper respiratory specimens during the acute phase of infection. The lowest concentration of SARS-CoV-2 viral copies this assay can detect is 138 copies/mL. A negative result does not preclude SARS-Cov-2 infection and should not be used as the sole basis for treatment or other patient management decisions. A negative result may occur with  improper specimen collection/handling, submission of specimen other than nasopharyngeal swab, presence of viral mutation(s) within the areas targeted by this assay, and inadequate number of viral copies(<138 copies/mL). A negative result must be combined with clinical observations, patient history, and epidemiological information. The expected result is Negative.  Fact Sheet for Patients:  EntrepreneurPulse.com.au  Fact Sheet for Healthcare Providers:  IncredibleEmployment.be  This test is no t yet approved or cleared by the [place] FDA and  has been authorized for detection and/or diagnosis of SARS-CoV-2 by FDA under an Emergency Use Authorization (EUA). This EUA will remain  in effect (meaning this test can be used) for the duration of the COVID-19 declaration under Section 564(b)(1) of the Act, 21 U.S.C.section 360bbb-3(b)(1), unless the authorization is terminated  or revoked sooner.       Influenza A  by PCR NEGATIVE NEGATIVE   Influenza B by PCR NEGATIVE NEGATIVE    Comment: (NOTE) The Xpert Xpress SARS-CoV-2/FLU/RSV plus assay is intended as an aid in the diagnosis of influenza from Nasopharyngeal swab specimens and should not be used as a sole basis for treatment. Nasal washings and aspirates are unacceptable for Xpert Xpress SARS-CoV-2/FLU/RSV testing.  Fact Sheet for Patients: EntrepreneurPulse.com.au  Fact Sheet for Healthcare Providers: IncredibleEmployment.be  This test is not yet approved or cleared by the [place] FDA and has been authorized for detection and/or diagnosis of SARS-CoV-2 by FDA under an Emergency Use Authorization (EUA). This EUA will remain in effect (meaning this test can be used) for the duration of the COVID-19 declaration under Section 564(b)(1) of the Act, 21 U.S.C. section 360bbb-3(b)(1), unless the authorization is terminated or revoked.  Performed at [facility], [place] [address] [place], [place] [address]   Rapid urine drug screen (hospital performed)     Status: None   Collection Time: 02/17/21  2:26 PM  Result Value Ref Range   Opiates NONE DETECTED NONE DETECTED   Cocaine NONE DETECTED NONE DETECTED   Benzodiazepines NONE DETECTED NONE DETECTED   Amphetamines NONE DETECTED NONE DETECTED   Tetrahydrocannabinol NONE DETECTED NONE DETECTED   Barbiturates NONE DETECTED NONE DETECTED    Comment: (NOTE) DRUG SCREEN FOR MEDICAL PURPOSES ONLY.  IF CONFIRMATION IS NEEDED FOR ANY PURPOSE, NOTIFY LAB WITHIN 5 DAYS.  LOWEST DETECTABLE LIMITS FOR URINE DRUG SCREEN Drug Class                     Cutoff (ng/mL) Amphetamine and metabolites    1000 Barbiturate and metabolites    200 Benzodiazepine                 650 Tricyclics and metabolites  300 Opiates and metabolites        300 Cocaine and metabolites        300 THC                            50 Performed at [facility] [address] [place], [place] [address]     Blood Alcohol level:  Lab Results  Component Value Date   ETH <10 02/17/2021   ETH 40 (H) 56/31/4970    Metabolic Disorder Labs:  No results found for: HGBA1C, MPG No results found for: PROLACTIN Lab Results  Component Value Date   CHOL 126 11/30/2020   TRIG 55 11/30/2020   HDL 70 11/30/2020   CHOLHDL 1.8 11/30/2020   VLDL 30 08/05/2015   LDLCALC 42 11/30/2020   LDLCALC 40 01/22/2019    Current Medications: Current Facility-Administered Medications  Medication Dose Route Frequency Provider Last Rate Last Admin  . acetaminophen (TYLENOL) tablet 650 mg  650 mg Oral Q6H PRN Ajibola, Ene A, NP      . alum & mag hydroxide-simeth (MAALOX/MYLANTA) 200-200-20 MG/5ML suspension 30 mL  30 mL Oral Q4H PRN Ajibola, Ene A, NP      . apixaban (ELIQUIS) tablet 5 mg  5 mg Oral BID Ajibola, Ene A, NP   5 mg at 02/19/21 0933  . atorvastatin (LIPITOR) tablet 40 mg  40 mg Oral QHS Ajibola, Ene A, NP      . benztropine (COGENTIN) tablet 1 mg  1 mg Oral BID Ajibola, Ene A, NP   1 mg at 02/19/21 0754  . bictegravir-emtricitabine-tenofovir AF (BIKTARVY) 50-200-25 MG per tablet 1 tablet  1 tablet Oral Daily Ajibola, Ene A, NP   1 tablet at 02/19/21 0933  . gabapentin (NEURONTIN) tablet 800 mg  800 mg Oral QHS Ajibola, Ene A, NP      . hydrOXYzine (ATARAX/VISTARIL) tablet 25 mg  25 mg Oral Q6H PRN Nelda Marseille, Amy E, MD      . lisinopril (ZESTRIL) tablet 40 mg  40 mg Oral Daily Ajibola, Ene A, NP   40 mg at 02/19/21 0755  . loperamide (IMODIUM) capsule 2-4 mg  2-4 mg Oral PRN Nelda Marseille, Amy E, MD      . LORazepam (ATIVAN) tablet 1 mg  1 mg Oral Q6H PRN Nelda Marseille, Amy E, MD      . magnesium hydroxide (MILK OF MAGNESIA) suspension 30 mL  30 mL Oral Daily PRN Ajibola, Ene A, NP      . mirtazapine (REMERON) tablet 30 mg  30 mg Oral QHS Ajibola, Ene A, NP      . mometasone-formoterol (DULERA) 200-5 MCG/ACT inhaler 2 puff  2 puff Inhalation  BID Ajibola, Ene A, NP      . montelukast (SINGULAIR) tablet 10 mg  10 mg Oral Daily Ajibola, Ene A, NP   10 mg at 02/19/21 0933  . multivitamin with minerals tablet 1 tablet  1 tablet Oral Daily Harlow Asa, MD   1 tablet at 02/19/21 0933  . ondansetron (ZOFRAN-ODT) disintegrating tablet 4 mg  4 mg Oral Q6H PRN Nelda Marseille, Amy E, MD      . pantoprazole (PROTONIX) EC tablet 40 mg  40 mg Oral Daily Ajibola, Ene A, NP   40 mg at 02/19/21 0754  . QUEtiapine (SEROQUEL) tablet 200 mg  200 mg Oral QHS Ajibola, Ene A, NP      . risperiDONE (RISPERDAL) tablet 1 mg  1 mg Oral Daily Ajibola, Ene A, NP   1 mg at 02/19/21 0754  . risperiDONE (RISPERDAL) tablet 2 mg  2 mg Oral QHS Nelda Marseille, Amy E, MD      . Derrill Memo ON 02/20/2021] thiamine tablet 100 mg  100 mg Oral Daily Singleton, Amy E, MD      . traZODone (DESYREL) tablet 50 mg  50 mg Oral QHS PRN Ajibola, Ene A, NP      . valbenazine (INGREZZA) capsule 80 mg  80 mg Oral QHS Ajibola, Ene A, NP       PTA Medications: Medications Prior to Admission  Medication Sig Dispense Refill Last Dose  . acetaminophen (TYLENOL) 500 MG tablet Take 1 tablet (500 mg total) by mouth every 6 (six) hours as needed for moderate pain. 30 tablet 0   . amLODipine (NORVASC) 10 MG tablet Take 1 tablet (10 mg total) by mouth daily. (Patient not taking: No sig reported) 30 tablet 0   . apixaban (ELIQUIS) 5 MG TABS tablet Take 1 tablet (5 mg total) by mouth 2 (two) times daily. 60 tablet 0   . atorvastatin (LIPITOR) 40 MG tablet Take 40 mg by mouth at bedtime.     Marland Kitchen azithromycin (ZITHROMAX) 250 MG tablet Take 1 tablet (250 mg total) by mouth daily. Take first 2 tablets together, then 1 every day until finished. (Patient not taking: No sig reported) 6 tablet 0   . benztropine (COGENTIN) 0.5 MG tablet Take 0.5 mg by mouth 2 (two) times daily. (Patient not taking: No sig reported)     . benztropine (COGENTIN) 1 MG tablet 1 tab in the morning 2 tabs at night (Patient taking  differently: Take 1 mg by mouth 2 (two) times daily.) 14 tablet 0   . bictegravir-emtricitabine-tenofovir AF (BIKTARVY) 50-200-25 MG TABS tablet Take 1 tablet by mouth daily. 90 tablet 3   . butalbital-acetaminophen-caffeine (ESGIC) 50-325-40 MG tablet Take 1 tablet by mouth 2 (two) times daily as needed for headache. (Patient not taking: No sig reported) 30 tablet 0   . feeding supplement, ENSURE ENLIVE, (ENSURE ENLIVE) LIQD Take 237 mLs by mouth 3 (three) times daily between meals. (Patient not taking: No sig reported) 237 mL 12   . fluticasone (FLONASE) 50 MCG/ACT nasal spray Place 1-2 sprays into both nostrils daily as needed for rhinitis. (Patient not taking: No sig reported)     . gabapentin (NEURONTIN) 800 MG tablet Take 800 mg by mouth at bedtime.     Marland Kitchen lisinopril (ZESTRIL) 40 MG tablet Take 40 mg by mouth daily.     . magnesium oxide (MAG-OX) 400 (241.3 Mg) MG tablet Take 1 tablet (400 mg total) by mouth 2 (two) times daily. (Patient not taking: No sig reported) 60 tablet 0   . metoprolol tartrate (LOPRESSOR) 25 MG tablet Take 1 tablet (25 mg total) by mouth 2 (two) times daily. (Patient not taking: No sig reported) 180 tablet 1   . mirtazapine (REMERON) 30 MG tablet Take 30 mg by mouth at bedtime.     . montelukast (SINGULAIR) 10 MG tablet Take 10 mg by mouth daily.     . naltrexone (DEPADE) 50 MG tablet Take 50 mg by mouth daily.     . nicotine polacrilex (NICORETTE) 2 MG gum Take 2 mg by mouth See admin instructions. Every 2 hours as needed for nicotine cravings     . oxyCODONE-acetaminophen (PERCOCET/ROXICET) 5-325 MG tablet Take 1 tablet by mouth every 6 (six) hours as needed for  severe pain. (Patient not taking: No sig reported) 15 tablet 0   . pantoprazole (PROTONIX) 40 MG tablet Take 40 mg by mouth daily.      . polyethylene glycol (MIRALAX / GLYCOLAX) packet Take 17 g by mouth daily as needed for moderate constipation.  (Patient not taking: No sig reported)     . QUEtiapine  (SEROQUEL) 400 MG tablet Take 0.5 tablets (200 mg total) by mouth at bedtime. (Patient taking differently: Take 400 mg by mouth at bedtime.)     . RISPERDAL 1 MG tablet Take 1 mg by mouth 2 (two) times daily. Take 1mg  in the morning, and 2mg  at bedtime     . SYMBICORT 160-4.5 MCG/ACT inhaler Inhale 2 puffs into the lungs 2 (two) times daily.     . tamsulosin (FLOMAX) 0.4 MG CAPS capsule Take 1 capsule (0.4 mg total) by mouth daily after supper. (Patient not taking: No sig reported) 30 capsule 0   . tiotropium (SPIRIVA HANDIHALER) 18 MCG inhalation capsule Inhale the contents of 1 capsule via handihaler every day (Patient taking differently: Place 18 mcg into inhaler and inhale daily.) 30 capsule 0   . traZODone (DESYREL) 50 MG tablet Take 1 tablet (50 mg total) by mouth at bedtime. (Patient not taking: No sig reported) 30 tablet 3   . Valbenazine Tosylate (INGREZZA) 80 MG CAPS Take 80 mg by mouth at bedtime.       Musculoskeletal: Strength & Muscle Tone: within normal limits Gait & Station: normal Patient leans: Front            Psychiatric Specialty Exam:  Psychiatric Specialty Exam: Physical Exam Vitals and nursing note reviewed.  Constitutional:      Appearance: He is ill-appearing.  HENT:     Head: Normocephalic.  Cardiovascular:     Rate and Rhythm: Normal rate.  Pulmonary:     Effort: Pulmonary effort is normal.  Musculoskeletal:        General: Normal range of motion.  Skin:    General: Skin is warm and dry.  Neurological:     Mental Status: He is alert and oriented to person, place, and time.     Review of Systems  Constitutional: Positive for weight loss.  HENT: Negative.   Eyes: Negative.   Respiratory: Negative.   Cardiovascular: Negative.   Gastrointestinal: Negative.   Genitourinary: Negative.   Musculoskeletal: Negative.   Skin: Negative.   Neurological: Positive for weakness.  Psychiatric/Behavioral: Positive for hallucinations.    Blood  pressure (!) 166/114, pulse 64, temperature (!) 97.5 F (36.4 C), temperature source Oral, resp. rate 18, height 5\' 9"  (1.753 m), weight 59.4 kg, SpO2 99 %.Body mass index is 19.35 kg/m.  General Appearance: Casual, Disheveled and cooperative  Eye Contact:  Good  Speech:  Clear and Coherent and Normal Rate  Volume:  Normal  Mood:  Depressed  Affect:  Congruent and Depressed  Thought Process:  Coherent  Orientation:  Full (Time, Place, and Person)  Thought Content:  Logical and Hallucinations: Auditory  Suicidal Thoughts:  No  Homicidal Thoughts:  No  Memory:  Immediate;   Fair Recent;   Fair Remote;   Fair  Judgement:  Fair  Insight:  Good  Psychomotor Activity:  Normal  Concentration:  Concentration: Good and Attention Span: Good  Recall:  [place] of Knowledge:  Good  Language:  Good  Akathisia:  No  Handed:  Right  AIMS (if indicated):     Assets:  Communication Skills  Desire for Improvement Housing Resilience  ADL's:  Impaired  Cognition:  WNL  Sleep:  Number of Hours: 5.5   Physical Exam: Physical Exam Vitals and nursing note reviewed.  Constitutional:      Appearance: He is ill-appearing.  HENT:     Head: Normocephalic.  Cardiovascular:     Rate and Rhythm: Normal rate.  Pulmonary:     Effort: Pulmonary effort is normal.  Musculoskeletal:        General: Normal range of motion.  Skin:    General: Skin is warm and dry.  Neurological:     Mental Status: He is alert and oriented to person, place, and time.    Review of Systems  Constitutional: Positive for weight loss.  HENT: Negative.   Eyes: Negative.   Respiratory: Negative.   Cardiovascular: Negative.   Gastrointestinal: Negative.   Genitourinary: Negative.   Musculoskeletal: Negative.   Skin: Negative.   Neurological: Positive for weakness.  Endo/Heme/Allergies: Negative.   Psychiatric/Behavioral: Positive for hallucinations.   Blood pressure (!) 153/104, pulse 73, temperature (!) 97.5 F  (36.4 C), temperature source Oral, resp. rate 18, height 5\' 9"  (1.753 m), weight 59.4 kg, SpO2 99 %. Body mass index is 19.35 kg/m.  Treatment Plan Summary: Daily contact with patient to assess and evaluate symptoms and progress in treatment and Medication management    Continue Eliouis 5 mg po daily for hx DVT  Lipitor 40 mg po at bed time for hypercholesterolemia  Start Amlodipine 5 mg po daily for HTN  Continue Lisinopril 40 mg po daily for htn  Continue Biktarvy 50-200-25 1 tablet po daily-HIV  Continue Mirtazapine 30 mg po at bed time for sleep and depression and appetite   Continue Risperdal 1 mg in am and 2 mg at night time for psychosis  Decrease Quetiapine to 100 mg po at bed time for psychosis -Plan to wean off  Continue INGREZZA capsule 80 mg po at bed time for TD   Continue Cogentin 1 mg po bid for EPS/TD  Continue Gabapentin 800 mg po at bed time for pain/mood  Encourage group participation. Observation Level/Precautions:  Fall  Laboratory:  CBC Chemistry Profile UDS CBC, CMP, results insignificantly abnormal.  UDS negative.  Will obtain HA1C, TSH  Psychotherapy: Encouraged to participate in therapy while in the unit  Medications:  See [facility]  Consultations:  NA  Discharge Concerns: Medication non compliance, Relapse   Estimated LOS:3-7 days  Other:     Physician Treatment Plan for Primary Diagnosis: <principal problem not specified> Long Term Goal(s): Improvement in symptoms so as ready for discharge  Short Term Goals: Ability to identify changes in lifestyle to reduce recurrence of condition will improve, Ability to verbalize feelings will improve, Ability to disclose and discuss suicidal ideas, Ability to demonstrate self-control will improve, Ability to identify and develop effective coping behaviors will improve, Ability to maintain clinical measurements within normal limits will improve, Compliance with prescribed medications will improve and Ability to identify  triggers associated with substance abuse/mental health issues will improve  Physician Treatment Plan for Secondary Diagnosis: Active Problems:   Paranoid schizophrenia ([place])   Schizophrenia ([place])  Long Term Goal(s): Improvement in symptoms so as ready for discharge  Short Term Goals: Ability to identify changes in lifestyle to reduce recurrence of condition will improve, Ability to verbalize feelings will improve, Ability to disclose and discuss suicidal ideas, Ability to demonstrate self-control will improve, Ability to identify and develop effective coping behaviors will improve, Ability to  maintain clinical measurements within normal limits will improve, Compliance with prescribed medications will improve and Ability to identify triggers associated with substance abuse/mental health issues will improve  I certify that inpatient services furnished can reasonably be expected to improve the patient's condition.    Delfin Gant, NP-PMHNP-BC 5/28/202210:14 AM

## 2021-02-19 NOTE — Progress Notes (Signed)
The patient rated his day as a 10 out of 10 since he had a good talk with his sister and Education officer, museum. He also mentioned that he is working on finding a new place to live. His goal for tomorrow is to have another good day.

## 2021-02-19 NOTE — Tx Team (Signed)
Initial Treatment Plan 02/19/2021 3:07 AM [name] BEM:[id number]    PATIENT STRESSORS: Marital or family conflict Medication change or noncompliance   PATIENT STRENGTHS: General fund of knowledge Motivation for treatment/growth   PATIENT IDENTIFIED PROBLEMS: Risk SI  Psychosis  Depression  "patience, help with voices"               DISCHARGE CRITERIA:  Improved stabilization in mood, thinking, and/or behavior Verbal commitment to aftercare and medication compliance  PRELIMINARY DISCHARGE PLAN: Attend aftercare/continuing care group Outpatient therapy  PATIENT/FAMILY INVOLVEMENT: This treatment plan has been presented to and reviewed with the patient, [name] Herring.  The patient and family have been given the opportunity to ask questions and make suggestions.  Providence Crosby, RN 02/19/2021, 3:07 AM

## 2021-02-19 NOTE — Progress Notes (Signed)
   02/19/21 0635  Vital Signs  Pulse Rate 70  Pulse Rate Source Monitor  BP (!) 146/113  BP Location Right Arm  BP Method Automatic  Patient Position (if appropriate) Standing  Oxygen Therapy  SpO2 100 %   D: Patient denies SI/HI. Patient was responding to internal stimuli during hie ECG. Patient isolated in his room. In the afternoon pt went to the day room to watch tv with peers. In the afternoon blood pressure was 132/96. A:  Patient took scheduled medicine.  Support and encouragement provided Routine safety checks conducted every 15 minutes. Patient  Informed to notify staff with any concerns.   R:  Safety maintained.

## 2021-02-19 NOTE — BHH Group Notes (Signed)
LCSW Group Therapy Note  02/19/2021   10:00-11:00am   Type of Therapy and Topic:  Group Therapy: Anger Cues and Responses  Participation Level:  Did Not Attend   Description of Group:   In this group, patients learned how to recognize the physical, cognitive, emotional, and behavioral responses they have to anger-provoking situations.  They identified a recent time they became angry and how they reacted.  They analyzed how their reaction was possibly beneficial and how it was possibly unhelpful.  The group discussed a variety of healthier coping skills that could help with such a situation in the future.  Focus was placed on how helpful it is to recognize the underlying emotions to our anger, because working on those can lead to a more permanent solution as well as our ability to focus on the important rather than the urgent.  Therapeutic Goals: Patients will remember their last incident of anger and how they felt emotionally and physically, what their thoughts were at the time, and how they behaved. Patients will identify how their behavior at that time worked for them, as well as how it worked against them. Patients will explore possible new behaviors to use in future anger situations. Patients will learn that anger itself is normal and cannot be eliminated, and that healthier reactions can assist with resolving conflict rather than worsening situations.  Summary of Patient Progress:  The patient did not attend.  Therapeutic Modalities:   Cognitive Behavioral Therapy  Maretta Los

## 2021-02-19 NOTE — BHH Counselor (Signed)
Clinical Social Work Note  CSW contacted patient's sister [name] [phone] at his request, as he said it was urgent that we talk with sister about finding a "retirement home" for him.  She had a wealth of information to share as follows:  Sister took out the Involuntary Commitment paperwork on patient after being contacted by [facility] [employer] ([facility]).  Sister (from [place]) and the patient's son (from [place]) met APS at the patient's apartment and subsequently did the IVC.  While at the patient's apartment, she met a staff member from what is apparently the patient's [facility] Community [facility] Team ([facility]  That individual, "Barnabas Lister," reported that he was the person who called in the APS report, and said that this was the 3rd report he has done.  He was at the home to deliver the patient's medicine, but the patient had a chain on his door and would not let [facility] staff in.  This has been ongoing for awhile, but sister stated we would have to talk to [facility] ACTT to find out exactly how long the patient has prevented them from entering the premises.  The patient would not give his ACTT Team permission or telephone numbers to call family.    Family met at the apartment with assigned APS worker Alinda Money [phone].  He reported that the patient is not classified as "incompetent" so they will not take guardianship, but he does have severe mental health issues.  APS said the patient has not been eating, has been losing weight, and has not taken his medicine.  There was feces in his clothing and the house was "deplorable, like a landfill with feces everywhere throughout the house."  They agree that the apartment could not have gotten into this condition quickly, but would have had to happen over the course of months.  There was uneaten, rotting food all over the floor where he appeared to have thrown it.  Per APS, the patient "cannot  live alone."  [name] has talked with patient's daughter, who is not providing any assistance but does come monthly to retrieve money.  Patient pays his $129 rent and his electric bill monthly, then apparently he gives her much of the rest of the money.  She refused to get involved, saying he is a grown man.  In the past, family has seen blood in patient's apartment on the walls, in the closet, and in the bathtub.  They have spent time/energy cleaning/mopping it up.  The patient himself would say he had vomited blood, but it looked like he had cut himself, and the family believes these were unreported suicide attempts.  The patient regularly makes statements like "I'm sick of being by myself" and  "I'm ready to die."    Sister calls the patient every day, and he will not answer for 3 weeks at a time.  They have sent the police for wellness checks and will then receive a return phone call saying "He says he is fine."  He has blamed the medicines for his lack of responsiveness, saying the medicines "put me out."    CSW shared that we can start the process of looking for nursing home versus assisted living, but that it cannot be promised he will not return home to his apartment in the meantime.  She asks that we speak with his ACTT Team and his APS worker as listed above.  Selmer Dominion, LCSW 02/19/2021, 4:21 PM

## 2021-02-19 NOTE — Progress Notes (Addendum)
Admission Note:  D:59 yr male who presents IVC in no acute distress for the treatment of SI and Depression. Pt appears flat and depressed. Pt was calm and cooperative with admission process. Pt presents with passive SI / AH and contracts for safety upon admission. Pt denies VH/ pain at this time . Pt stated he was not eating and lost 20 lbs and his family thought he needed to come to the hospital. Pt has Hx HIV, PE , HTN .   Per Assessment: per IVC sister is concerned patient not taking medication for mental health or caring for himself. Pt has a history of bi polar and schizophrenia  and says he was referred for assessment by sister.  Pt reports medication compliance. Pt denies current suicidal ideation with no  plans of self harm and two past attempts one 20 years ago and one 12 years ago by OD.  Pt states current stressors include being alone . Patient reports living alone and becoming more and more depressed because he is isolated. Patient attends a support group for HIV but uses alcohol as a coping method.   Pt lives alone  and supports include family. Pt.denies a hx of abuse and trauma. Pt denies there is a family history of mental health . Pt is on disability for past 20 years for mental health. Pt's OP history includes Dr.Dixon for HIV [facility] Group [facility] . IP history includes [facility] and [facility]. Last admission was at [facility].  Pt reports alcohol/ substance abuse. Patient reports ETOH abuse . Patient reports drinking 3-4 glasses of scotch daily . Last drink was 2 weeks ago and reports mild withdrawal symptoms.    A:Skin was assessed and found to be clear of any abnormal marks . PT searched and no contraband found, POC and unit policies explained and understanding verbalized. Consents obtained. Food and fluids offered, and fluids accepted.   R:Pt had no additional questions or concerns.

## 2021-02-19 NOTE — Progress Notes (Signed)
   02/19/21 2256  COVID-19 Daily Checkoff  Have you had a fever (temp > 37.80C/100F)  in the past 24 hours?  No  If you have had runny nose, nasal congestion, sneezing in the past 24 hours, has it worsened? No  COVID-19 EXPOSURE  Have you traveled outside the state in the past 14 days? No  Have you been in contact with someone with a confirmed diagnosis of COVID-19 or PUI in the past 14 days without wearing appropriate PPE? No  Have you been living in the same home as a person with confirmed diagnosis of COVID-19 or a PUI (household contact)? No  Have you been diagnosed with COVID-19? No

## 2021-02-19 NOTE — Progress Notes (Signed)
   02/19/21 2301  Psych Admission Type (Psych Patients Only)  Admission Status Involuntary  Psychosocial Assessment  Patient Complaints None  Eye Contact Fair  Facial Expression Other (Comment) (Pleasant)  Affect Appropriate to circumstance  Speech Logical/coherent  Interaction Assertive  Motor Activity Shuffling  Appearance/Hygiene Disheveled  Behavior Characteristics Appropriate to situation  Mood Pleasant  Thought Process  Coherency WDL  Content WDL  Delusions None reported or observed  Perception Hallucinations  Hallucination Auditory  Judgment Impaired  Confusion None  Danger to Self  Current suicidal ideation? Denies  Self-Injurious Behavior No self-injurious ideation or behavior indicators observed or expressed   Agreement Not to Harm Self Yes  Description of Agreement verbal contract for safety  Danger to Others  Danger to Others None reported or observed

## 2021-02-20 DIAGNOSIS — F205 Residual schizophrenia: Secondary | ICD-10-CM | POA: Diagnosis not present

## 2021-02-20 LAB — LIPID PANEL
Cholesterol: 115 mg/dL (ref 0–200)
HDL: 59 mg/dL (ref >40)
LDL Cholesterol: 42 mg/dL (ref 0–99)
Total CHOL/HDL Ratio: 1.9 RATIO
Triglycerides: 69 mg/dL (ref <150)
VLDL: 14 mg/dL (ref 0–40)

## 2021-02-20 LAB — BASIC METABOLIC PANEL
Anion gap: 3 — ABNORMAL LOW (ref 5–15)
BUN: 20 mg/dL (ref 6–20)
CO2: 25 mmol/L (ref 22–32)
Calcium: 9.1 mg/dL (ref 8.9–10.3)
Chloride: 111 mmol/L (ref 98–111)
Creatinine, Ser: 0.93 mg/dL (ref 0.61–1.24)
GFR, Estimated: >60 mL/min (ref >60)
Glucose, Bld: 83 mg/dL (ref 70–99)
Potassium: 4.2 mmol/L (ref 3.5–5.1)
Sodium: 139 mmol/L (ref 135–145)

## 2021-02-20 LAB — TSH: TSH: 1.271 u[IU]/mL (ref 0.350–4.500)

## 2021-02-20 MED ORDER — QUETIAPINE FUMARATE 50 MG PO TABS
50.0000 mg | ORAL_TABLET | Freq: Every day | ORAL | Status: DC
  Administered 2021-02-20 – 2021-02-21 (×2): 50 mg via ORAL
  Filled 2021-02-20 (×3): qty 1

## 2021-02-20 MED ORDER — RISPERIDONE 2 MG PO TABS
2.0000 mg | ORAL_TABLET | Freq: Two times a day (BID) | ORAL | Status: DC
  Administered 2021-02-20 – 2021-02-24 (×8): 2 mg via ORAL
  Filled 2021-02-20 (×12): qty 1

## 2021-02-20 NOTE — Progress Notes (Addendum)
D:  Patient's self inventory sheet, patient sleeps good, sleep medication helpful.  Good appetite, normal energy level, good concentration.  Denied depression and hopeless.  Rated anxiety 1.  Denied withdrawals.  Denied SI.  SI sometimes.  Denied physical pain.  No pain medicine.  No goal.  No plans.  No discharge plans. A:  Medications administered per MD orders.  Emotional support and encouragement given patient. R:  Denied SI and HI this morning while talking to nurse.  Contracts for safety.  Patient stated he sees dead people who talk to him, telling him to kill himself.   Safety maintained with 15 minute checks.

## 2021-02-20 NOTE — BHH Group Notes (Signed)
Goals Group Patient states that depression was a 0/10 and anxiety 5/10. Patient states taht anxiety is high due to thoughts of going home and understanding that the same stressors are there.  Patient reports that he is lonely and plans to attend support groups 4 days a week when discharged.

## 2021-02-20 NOTE — Progress Notes (Signed)
The focus of this group is to help patients review their daily goal of treatment and discuss progress on daily workbooks. Pt attended the evening group session and responded to all discussion prompts from the [place]. Pt shared that today was a good day on the unit, the highlight of which was going outside for fresh air.  Pt told that his goal for the coming week was to "get my medication right." Pt was encouraged by the Writer to share all medication feedback with his treatment team in order to complete that goal.  Pt rated his day a 7 out of 10 and his affect was appropriate.

## 2021-02-20 NOTE — Progress Notes (Signed)
[facility] MD Progress Note  02/20/2021 2:02 PM EON ZUNKER  MRN:  [id number] Subjective:  " I am still hearing  voices and seeing dead people" Reason for admission: AA Male, 60 years old seen this morning for admission assessment.  Patient was admitted from the ER yesterday evening under IVC for treatment of Schizophrenia and medication non compliance.   Past Psychiatric hx includes Schizophrenia, Bipolar disorder,substance abuse, Alcohol abuse. Patient was brought by [employer] for lack of care, not taking medications.    His sister was concerned about him and made the referral.  Patient has had previous multiple inpatient Psychiatric hospitalizations including hospitalizations at [facility].  Objective:  Patient was met in his room for today's assessment.  Report was received, records reviewed and plan of care revieedd with our interdisciplinary team staff.  Patient has cleaned up today however stated he is not capable of living alone.  Patient needs directing and supervisions to perform some task.  Patient reported seeing dead people in the unit and the voices continues to command him to kill himself.  He again stated that he is not going to harm himself or anybody else.  Patient is worried about discharge plan stating that going back home alone will bring him back to the hospital soon.  Patient rated his anxiety 5/10 with 10 being severe anxiety.  We will increase his Risperdal to 2 mg po bid for Psychosis as we continue to taper off Quetiapine.  Risperdal will continue to be increased as well.  He reported good night sleep and appetite is improving some.  So far he has been participating in group activities today.  He denies suicide ideation or Homicidal ideation or mention of Paranoia. Chart review from Psychosocial hx reveals that patient had ACT team-[facility] and have not been allowing the staff come into his apartment.  He barricaded himself inside.  He has not been taking his medications because his ACT Team staff could  not come in.  Patient has been been bathing or taking care of his feeding and other needs.  See complete Psychosocial notes. Plan is to have him in facility per the recommendation of DSS and act team.  Patient however is taking his medications here in the unit and eating. Principal Problem: Schizophrenia ([place]) Diagnosis: Principal Problem:   Schizophrenia ([place]) Active Problems:   Alcohol abuse  Total Time spent with patient: 30 minutes  Past Psychiatric History: see H&P note  Past Medical History:  Past Medical History:  Diagnosis Date  . Depression   . HIV (human immunodeficiency virus infection) ([place])   . Hypertension   . Immune deficiency disorder ([place])   . PE (pulmonary thromboembolism) ([place]) 2018  . Personality disorder ([place])   . Schizophrenia [facility])     Past Surgical History:  Procedure Laterality Date  . COLONOSCOPY WITH PROPOFOL N/A 06/15/2017   Procedure: COLONOSCOPY WITH PROPOFOL;  Surgeon: Irene Shipper, MD;  Location: [facility] ENDOSCOPY;  Service: Endoscopy;  Laterality: N/A;  . ESOPHAGOGASTRODUODENOSCOPY (EGD) WITH PROPOFOL N/A 06/15/2017   Procedure: ESOPHAGOGASTRODUODENOSCOPY (EGD) WITH PROPOFOL;  Surgeon: Irene Shipper, MD;  Location: [facility] ENDOSCOPY;  Service: Endoscopy;  Laterality: N/A;  . IR GENERIC HISTORICAL  11/04/2016   IR IVC FILTER PLMT / S&I Burke Keels GUID/MOD SED 11/04/2016 Aletta Edouard, MD [facility]-INTERV RAD   Family History:  Family History  Problem Relation Age of Onset  . CAD Mother   . Kidney disease Sister   . Lupus Brother   . Cancer Maternal Grandmother   .  Stroke Paternal Grandmother    Family Psychiatric  History: See H&P note Social History:  Social History   Substance and Sexual Activity  Alcohol Use Yes   Comment: states once a month     Social History   Substance and Sexual Activity  Drug Use Not Currently   Comment: Patient denies use, but UDS + cocaine    Social History   Socioeconomic History  . Marital status: Divorced    Spouse name:  Not on file  . Number of children: Not on file  . Years of education: Not on file  . Highest education level: Not on file  Occupational History  . Occupation: disabled  Tobacco Use  . Smoking status: Current Every Day Smoker    Packs/day: 0.50    Years: 33.00    Pack years: 16.50    Types: Cigarettes    Last attempt to quit: 04/25/2019    Years since quitting: 1.8  . Smokeless tobacco: Never Used  Vaping Use  . Vaping Use: Never used  Substance and Sexual Activity  . Alcohol use: Yes    Comment: states once a month  . Drug use: Not Currently    Comment: Patient denies use, but UDS + cocaine  . Sexual activity: Not Currently    Partners: Male, Male    Birth control/protection: Condom    Comment: no activity x 15 years  Other Topics Concern  . Not on file  Social History Narrative  . Not on file   Social Determinants of Health   Financial Resource Strain: Not on file  Food Insecurity: Not on file  Transportation Needs: Not on file  Physical Activity: Not on file  Stress: Not on file  Social Connections: Not on file   Additional Social History:                         Sleep: Good  Appetite:  Fair  Current Medications: Current Facility-Administered Medications  Medication Dose Route Frequency Provider Last Rate Last Admin  . acetaminophen (TYLENOL) tablet 650 mg  650 mg Oral Q6H PRN Ajibola, Ene A, NP      . alum & mag hydroxide-simeth (MAALOX/MYLANTA) 200-200-20 MG/5ML suspension 30 mL  30 mL Oral Q4H PRN Ajibola, Ene A, NP      . amLODipine (NORVASC) tablet 5 mg  5 mg Oral Daily Shalev Helminiak C, NP   5 mg at 02/20/21 0809  . apixaban (ELIQUIS) tablet 5 mg  5 mg Oral BID Ajibola, Ene A, NP   5 mg at 02/20/21 0809  . atorvastatin (LIPITOR) tablet 40 mg  40 mg Oral QHS Ajibola, Ene A, NP   40 mg at 02/19/21 2109  . benztropine (COGENTIN) tablet 1 mg  1 mg Oral BID Ajibola, Ene A, NP   1 mg at 02/20/21 0810  . bictegravir-emtricitabine-tenofovir AF  (BIKTARVY) 50-200-25 MG per tablet 1 tablet  1 tablet Oral Daily Ajibola, Ene A, NP   1 tablet at 02/20/21 0808  . cloNIDine (CATAPRES) tablet 0.1 mg  0.1 mg Oral Q8H PRN Viann Fish E, MD   0.1 mg at 02/19/21 2109  . gabapentin (NEURONTIN) tablet 800 mg  800 mg Oral QHS Ajibola, Ene A, NP   800 mg at 02/19/21 2110  . hydrOXYzine (ATARAX/VISTARIL) tablet 25 mg  25 mg Oral Q6H PRN Harlow Asa, MD   25 mg at 02/20/21 0813  . lisinopril (ZESTRIL) tablet 40 mg  40 mg  Oral Daily Ajibola, Ene A, NP   40 mg at 02/20/21 0809  . loperamide (IMODIUM) capsule 2-4 mg  2-4 mg Oral PRN Nelda Marseille, Amy E, MD      . LORazepam (ATIVAN) tablet 1 mg  1 mg Oral Q6H PRN Nelda Marseille, Amy E, MD      . magnesium hydroxide (MILK OF MAGNESIA) suspension 30 mL  30 mL Oral Daily PRN Ajibola, Ene A, NP      . mirtazapine (REMERON) tablet 30 mg  30 mg Oral QHS Ajibola, Ene A, NP   30 mg at 02/19/21 2109  . mometasone-formoterol (DULERA) 200-5 MCG/ACT inhaler 2 puff  2 puff Inhalation BID Ajibola, Ene A, NP   2 puff at 02/20/21 0807  . montelukast (SINGULAIR) tablet 10 mg  10 mg Oral Daily Ajibola, Ene A, NP   10 mg at 02/20/21 0809  . multivitamin with minerals tablet 1 tablet  1 tablet Oral Daily Harlow Asa, MD   1 tablet at 02/20/21 0810  . ondansetron (ZOFRAN-ODT) disintegrating tablet 4 mg  4 mg Oral Q6H PRN Nelda Marseille, Amy E, MD      . pantoprazole (PROTONIX) EC tablet 40 mg  40 mg Oral Daily Ajibola, Ene A, NP   40 mg at 02/20/21 0809  . QUEtiapine (SEROQUEL) tablet 50 mg  50 mg Oral QHS ,  C, NP      . risperiDONE (RISPERDAL) tablet 2 mg  2 mg Oral BID ,  C, NP      . thiamine tablet 100 mg  100 mg Oral Daily Nelda Marseille, Amy E, MD   100 mg at 02/20/21 0808  . traZODone (DESYREL) tablet 50 mg  50 mg Oral QHS PRN Ajibola, Ene A, NP      . valbenazine (INGREZZA) capsule 80 mg  80 mg Oral QHS Ajibola, Ene A, NP   80 mg at 02/19/21 2108    Lab Results:  Results for orders placed or  performed during the hospital encounter of 02/18/21 (from the past 48 hour(s))  TSH     Status: None   Collection Time: 02/20/21  6:22 AM  Result Value Ref Range   TSH 1.271 0.350 - 4.500 uIU/mL    Comment: Performed by a 3rd Generation assay with a functional sensitivity of <=0.01 uIU/mL. Performed at [facility], [place] [address], [place], [place] [address]   Lipid panel     Status: None   Collection Time: 02/20/21  6:22 AM  Result Value Ref Range   Cholesterol 115 0 - 200 mg/dL   Triglycerides 69 <150 mg/dL   HDL 59 >40 mg/dL   Total CHOL/HDL Ratio 1.9 RATIO   VLDL 14 0 - 40 mg/dL   LDL Cholesterol 42 0 - 99 mg/dL    Comment:        Total Cholesterol/HDL:CHD Risk Coronary Heart Disease Risk Table                     Men   Women  1/2 Average Risk   3.4   3.3  Average Risk       5.0   4.4  2 X Average Risk   9.6   7.1  3 X Average Risk  23.4   11.0        Use the calculated Patient Ratio above and the CHD Risk Table to determine the patient's CHD Risk.        ATP III CLASSIFICATION (LDL):  <100  mg/dL   Optimal  100-129  mg/dL   Near or Above                    Optimal  130-159  mg/dL   Borderline  160-189  mg/dL   High  >190     mg/dL   Very High Performed at [facility] [address] [place], [place] [address]   Basic metabolic panel     Status: Abnormal   Collection Time: 02/20/21  6:22 AM  Result Value Ref Range   Sodium 139 135 - 145 mmol/L   Potassium 4.2 3.5 - 5.1 mmol/L   Chloride 111 98 - 111 mmol/L   CO2 25 22 - 32 mmol/L   Glucose, Bld 83 70 - 99 mg/dL    Comment: Glucose reference range applies only to samples taken after fasting for at least 8 hours.   BUN 20 6 - 20 mg/dL   Creatinine, Ser 0.93 0.61 - 1.24 mg/dL   Calcium 9.1 8.9 - 10.3 mg/dL   GFR, Estimated >60 >60 mL/min    Comment: (NOTE) Calculated using the CKD-EPI Creatinine Equation (2021)    Anion gap 3 (L) 5 - 15    Comment: Performed at [facility], [place] [address], [place], [place] [address]    Blood Alcohol level:  Lab Results  Component Value Date   ETH <10 02/17/2021   ETH 40 (H) 24/58/0998    Metabolic Disorder Labs: No results found for: HGBA1C, MPG No results found for: PROLACTIN Lab Results  Component Value Date   CHOL 115 02/20/2021   TRIG 69 02/20/2021   HDL 59 02/20/2021   CHOLHDL 1.9 02/20/2021   VLDL 14 02/20/2021   LDLCALC 42 02/20/2021   LDLCALC 42 11/30/2020    Physical Findings: AIMS:  , ,  ,  ,    CIWA:  CIWA-Ar Total: 0 COWS:     Musculoskeletal: Strength & Muscle Tone: within normal limits Gait & Station: normal Patient leans: Front  Psychiatric Specialty Exam: Psychiatric Specialty Exam: Physical Exam Constitutional:      Appearance: He is ill-appearing.  HENT:     Head: Normocephalic and atraumatic.     Nose: Nose normal.  Cardiovascular:     Rate and Rhythm: Normal rate and regular rhythm.     Pulses: Normal pulses.  Musculoskeletal:     Cervical back: Normal range of motion.  Skin:    General: Skin is warm and dry.  Neurological:     Mental Status: He is alert and oriented to person, place, and time.     Review of Systems  Constitutional: Negative.   HENT: Negative.   Eyes: Negative.   Respiratory: Negative.   Cardiovascular: Negative.   Gastrointestinal: Negative.   Genitourinary: Negative.   Musculoskeletal: Negative.   Skin: Negative.   Psychiatric/Behavioral: Positive for hallucinations and memory loss.    Blood pressure 112/90, pulse 70, temperature 98.2 F (36.8 C), temperature source Oral, resp. rate 18, height 5' 9" (1.753 m), weight 59.4 kg, SpO2 99 %.Body mass index is 19.35 kg/m.  General Appearance: Casual and Fairly Groomed  Eye Contact:  Minimal  Speech:  Clear and Coherent and Normal Rate  Volume:  Normal  Mood:  Depressed  Affect:  Congruent and Depressed  Thought Process:  Coherent and Goal Directed  Orientation:  Full  (Time, Place, and Person)  Thought Content:  Hallucinations: Auditory Command:  Kill himseolf and people. Visual-seeing dead  people  Suicidal Thoughts:  No  Homicidal Thoughts:  No  Memory:  Immediate;   Fair Recent;   Fair Remote;   Fair  Judgement:  Fair  Insight:  Good  Psychomotor Activity:  Normal  Concentration:  Concentration: Good and Attention Span: Good  Recall:  [place] of Knowledge:  Good  Language:  Good  Akathisia:  No  Handed:  Right  AIMS (if indicated):     Assets:  Communication Skills Desire for Improvement Resilience  ADL's:  Intact  Cognition:  WNL  Sleep:  Number of Hours: 6.75     Physical Exam: Physical Exam Constitutional:      Appearance: He is ill-appearing.  HENT:     Head: Normocephalic and atraumatic.     Nose: Nose normal.  Cardiovascular:     Rate and Rhythm: Normal rate and regular rhythm.     Pulses: Normal pulses.  Musculoskeletal:     Cervical back: Normal range of motion.  Skin:    General: Skin is warm and dry.  Neurological:     Mental Status: He is alert and oriented to person, place, and time.    Review of Systems  Constitutional: Negative.   HENT: Negative.   Eyes: Negative.   Respiratory: Negative.   Cardiovascular: Negative.   Gastrointestinal: Negative.   Genitourinary: Negative.   Musculoskeletal: Negative.   Skin: Negative.   Psychiatric/Behavioral: Positive for hallucinations and memory loss.   Blood pressure 112/90, pulse 70, temperature 98.2 F (36.8 C), temperature source Oral, resp. rate 18, height 5' 9" (1.753 m), weight 59.4 kg, SpO2 99 %. Body mass index is 19.35 kg/m.   Treatment Plan Summary: Daily contact with patient to assess and evaluate symptoms and progress in treatment and Medication management   Continue Eliouis 5 mg po daily for hx DVT             Lipitor 40 mg po at bed time for hypercholesterolemia             Continue  Amlodipine 5 mg po daily for HTN             Continue  Lisinopril 40 mg po daily for htn             Continue Biktarvy 50-200-25 1 tablet po daily-HIV             Continue Mirtazapine 30 mg po at bed time for sleep and depression and appetite              Increase  Risperdal  To  2 mg bid  for psychosis             Decrease Quetiapine to 50 mg po at bed time for psychosis -Plan to wean off             Continue INGREZZA capsule 80 mg po at bed time for TD              Continue Cogentin 1 mg po bid for EPS/TD             Continue Gabapentin 800 mg po at bed time for pain/mood  Offer all medications as prescribed.             Encourage group participation.  Delfin Gant, NP-PMHNP-BC 02/20/2021, 2:02 PM

## 2021-02-20 NOTE — Plan of Care (Signed)
Nurse discussed anxiety, depression and coping skills with patient.  

## 2021-02-20 NOTE — Progress Notes (Signed)
   02/20/21 2242  Psych Admission Type (Psych Patients Only)  Admission Status Involuntary  Psychosocial Assessment  Patient Complaints None  Eye Contact Fair  Facial Expression Animated  Affect Appropriate to circumstance  Speech Logical/coherent  Interaction Assertive  Motor Activity Shuffling  Appearance/Hygiene Disheveled  Behavior Characteristics Appropriate to situation  Mood Pleasant  Thought Process  Coherency WDL  Content WDL  Delusions None reported or observed  Perception Hallucinations  Hallucination Auditory;Visual  Judgment Impaired  Confusion None  Danger to Self  Current suicidal ideation? Denies  Self-Injurious Behavior No self-injurious ideation or behavior indicators observed or expressed   Agreement Not to Harm Self Yes  Description of Agreement verbal contract for safety  Danger to Others  Danger to Others None reported or observed

## 2021-02-20 NOTE — Progress Notes (Signed)
   02/20/21 2239  COVID-19 Daily Checkoff  Have you had a fever (temp > 37.80C/100F)  in the past 24 hours?  No  If you have had runny nose, nasal congestion, sneezing in the past 24 hours, has it worsened? No  COVID-19 EXPOSURE  Have you traveled outside the state in the past 14 days? No  Have you been in contact with someone with a confirmed diagnosis of COVID-19 or PUI in the past 14 days without wearing appropriate PPE? No  Have you been living in the same home as a person with confirmed diagnosis of COVID-19 or a PUI (household contact)? No  Have you been diagnosed with COVID-19? No

## 2021-02-20 NOTE — [facility] Group Notes (Signed)
LCSW Group Therapy Note  02/20/2021       Type of Therapy and Topic:  Group Therapy: "My Mental Health"  Participation Level:  Active   Description of Group:   In this group, patients were asked four questions in order to generate discussion around the idea of mental illness and/or substance addiction being medical problems: In one sentence describe the current state of your mental health or substance use. How much do you feel similar to or different from others? Do you tend to identify with other people or compare yourself to them?  In a word or sentence, share what you desire your mental health or substance use to be moving forward.  Discussion was held that led to the conclusion that comparing ourselves to others is not healthy, but identifying with the elements of their issues that are similar to ours is helpful.    Therapeutic Goals: Patients will identify their feelings about their current mental health/substance use problems. Patients will describe how they feel similar to or different from others, and whether they tend to identify with or compare themselves to other people with the same issues. Patients will explore the differences in these concepts and how a change of mindset about mental health/substance use can help with reaching recovery goals. Patients will think about and share what their recovery goals are, in terms of mental health and/or substance use.  Summary of Patient Progress:  The patient shared that the state of his mental health right now is "out of control."  He shared that he feels different from other people much of the time, but also recognizes that he has a lot in common with other people.  He wants to not feel out of control and wants to feel stable.  Therapeutic Modalities:   Processing Motivational Interviewing Psychoeducation  Maretta Los, MSW, LCSW

## 2021-02-21 DIAGNOSIS — F2089 Other schizophrenia: Secondary | ICD-10-CM | POA: Diagnosis not present

## 2021-02-21 MED ORDER — AMLODIPINE BESYLATE 10 MG PO TABS
10.0000 mg | ORAL_TABLET | Freq: Every day | ORAL | Status: DC
  Administered 2021-02-22 – 2021-02-24 (×3): 10 mg via ORAL
  Filled 2021-02-21 (×5): qty 1

## 2021-02-21 MED ORDER — ENSURE ENLIVE PO LIQD
237.0000 mL | Freq: Two times a day (BID) | ORAL | Status: DC
  Administered 2021-02-21 – 2021-02-23 (×3): 237 mL via ORAL
  Filled 2021-02-21 (×10): qty 237

## 2021-02-21 NOTE — Progress Notes (Signed)
Recreation Therapy Notes  Date:  5.30.22 Time: 0930 Location: [address] Dayroom  Group Topic: Stress Management  Goal Area(s) Addresses:  Patient will identify positive stress management techniques. Patient will identify benefits of using stress management post d/c.  Behavioral Response: Engaged  Intervention: Stress Management  Activity :  Meditation.  LRT played a meditation that focused on being persistent in not only meditation but in life as well.  Patients were to listen and follow along as meditation played to fully engage in activity.  Education:  Stress Management, Discharge Planning.   Education Outcome: Acknowledges Education  Clinical Observations/Feedback: Pt attended and participated.  Pt expressed no concerns or had any questions.    Victorino Sparrow, LRT/CTRS         Victorino Sparrow A 02/21/2021 11:57 AM

## 2021-02-21 NOTE — [facility] Counselor (Signed)
CSW left a HIPPA compliant message for [facility] APS social worker Alinda Money [phone] concerning APS guardianship and discharge placement.   [name], [place] Worker [employer]

## 2021-02-21 NOTE — Progress Notes (Signed)
   02/21/21 2052  Psych Admission Type (Psych Patients Only)  Admission Status Involuntary  Psychosocial Assessment  Patient Complaints None  Eye Contact Fair  Facial Expression Animated  Affect Appropriate to circumstance  Speech Logical/coherent  Interaction Assertive  Motor Activity Shuffling  Appearance/Hygiene Body odor;Poor hygiene;Disheveled  Behavior Characteristics Cooperative;Appropriate to situation  Mood Pleasant  Thought Process  Coherency WDL  Content WDL  Delusions None reported or observed  Perception Hallucinations ("I always hear and see things")  Hallucination Auditory;Visual  Judgment Impaired  Confusion None  Danger to Self  Current suicidal ideation? Denies  Self-Injurious Behavior No self-injurious ideation or behavior indicators observed or expressed   Agreement Not to Harm Self Yes  Description of Agreement verbal contract  Danger to Others  Danger to Others None reported or observed

## 2021-02-21 NOTE — Progress Notes (Signed)
Adult Psychoeducational Group Note  Date:  02/21/2021 Time:  8:47 AM  Group Topic/Focus:  Goals Group:   The focus of this group is to help patients establish daily goals to achieve during treatment and discuss how the patient can incorporate goal setting into their daily lives to aide in recovery.  Participation Level:  Active  Participation Quality:  Appropriate  Affect:  Appropriate  Cognitive:  Appropriate  Insight: Appropriate  Engagement in Group:  Engaged  Modes of Intervention:  Discussion  Additional Comments:  Pt goal for the day is to talk to the dr and social workers about his living arangments.  [name] Herring [name] 02/21/2021, 8:47 AM

## 2021-02-21 NOTE — Progress Notes (Signed)
   02/21/21 1121  Vital Signs  Pulse Rate (!) 58  Pulse Rate Source Dinamap  Resp 16  BP (!) 170/108  BP Location Right Arm  BP Method Automatic  Patient Position (if appropriate) Sitting     02/21/21 1300  Vital Signs  Pulse Rate 64  Pulse Rate Source Dinamap  Resp 16  BP (!) 137/92    Pt taking medications as prescribed, Clonidine 0.1 mg PO, PRN dose given for 170/108 with improvement. Pt pleasant and cooperative, no complaints voiced. Provider notified.

## 2021-02-21 NOTE — Progress Notes (Signed)
[place] Group Notes:  (Nursing/MHT/Case Management/Adjunct)  Date:  02/21/2021  Time:  8:27 PM  Type of Therapy:  Group Therapy  Participation Level:  Active  Participation Quality:  Attentive  Affect:  Appropriate  Cognitive:  Appropriate  Insight:  Appropriate  Engagement in Group:  Improving  Modes of Intervention:  Discussion  Summary of Progress/Problems:  Maxine Glenn 02/21/2021, 8:27 PM

## 2021-02-21 NOTE — B[facility] Counselor (Signed)
CSW spoke with pt's sister,  sister [name] [phone], who stated that she thinks the pt has medicaid and medicare. CSW will verify this with [facility] and APS social worker. "We want him in assisted living facility." Reports that his son has keys to his apartment, atm card and ID. Ms. Len Childs reports that she has been the pt's guardian in the past and wishes that DSS would take guardianship.   [name], [place] Worker [employer]

## 2021-02-21 NOTE — Progress Notes (Signed)
    02/21/21 0800  Psych Admission Type (Psych Patients Only)  Admission Status Involuntary  Psychosocial Assessment  Patient Complaints None  Eye Contact Fair  Facial Expression Animated  Affect Appropriate to circumstance  Speech Logical/coherent  Interaction Assertive  Motor Activity Shuffling  Appearance/Hygiene Disheveled  Behavior Characteristics Appropriate to situation  Mood Pleasant  Thought Process  Coherency WDL  Content WDL  Delusions None reported or observed  Perception Hallucinations ("I see dead people and beeping sounds.")  Hallucination Auditory;Visual  Judgment Impaired  Confusion None  Danger to Self  Current suicidal ideation? Denies  Self-Injurious Behavior No self-injurious ideation or behavior indicators observed or expressed   Agreement Not to Harm Self Yes  Description of Agreement verbal contract for safety  Danger to Others  Danger to Others None reported or observed  [place] NOVEL CORONAVIRUS (COVID-19) DAILY CHECK-OFF SYMPTOMS - answer yes or no to each - every day NO YES  Have you had a fever in the past 24 hours?  Fever (Temp > 37.80C / 100F) X   Have you had any of these symptoms in the past 24 hours? New Cough  Sore Throat   Shortness of Breath  Difficulty Breathing  Unexplained Body Aches   X   Have you had any one of these symptoms in the past 24 hours not related to allergies?   Runny Nose  Nasal Congestion  Sneezing   X   If you have had runny nose, nasal congestion, sneezing in the past 24 hours, has it worsened?  X   EXPOSURES - check yes or no X   Have you traveled outside the state in the past 14 days?  X   Have you been in contact with someone with a confirmed diagnosis of COVID-19 or PUI in the past 14 days without wearing appropriate PPE?  X   Have you been living in the same home as a person with confirmed diagnosis of COVID-19 or a PUI (household contact)?    X   Have you been diagnosed with COVID-19?    X               What to do next: Answered NO to all: Answered YES to anything:   Proceed with unit schedule Follow the [facility] Inpatient Flowsheet.

## 2021-02-21 NOTE — Progress Notes (Signed)
[facility] MD Progress Note  02/21/2021 6:43 AM [name] Herring  MRN:  [id number]   Chief Complaint: psychosis and SI  Subjective:  [name] Herring is a 60 y.o. male with a self-reported history of schizophrenia, who was initially admitted for inpatient psychiatric hospitalization on 02/18/2021 for management of worsening command AH and SI. Prior to admission he also reported alcohol abuse and was unclear if he had been compliant with home medications prior to admission.The patient is currently on Hospital Day 3.   Chart Review from last 24 hours:  The patient's chart was reviewed and nursing notes were reviewed. The patient's case was discussed in multidisciplinary team meeting.Per nursing, he attended some groups and had no acute behavioral or safety concerns noted on the unit. Per [facility] he was compliant with scheduled medications and did require PRN Clonidine X1 today for elevated BP.   Information Obtained Today During Patient Interview: The patient was seen and evaluated on the unit. On assessment today the patient reports that his mood is "alright." He still feels somewhat depressed and admits to residual command AH telling him to hurt himself. He feels he can resist acting on any voices and denies SI, intent or plan and contracts for safety on the unit. He denies HI, VH, paranoia, ideas of reference, or thought insertion/withdrawal. He endorses belief in thought broadcasting. He is tolerating the dose increase in his Risperdal and denies current medication side-effects. He voices no physical complaints other than intermittent headaches and reports good sleep and improved appetite. He does not report cravings or withdrawal symptoms from alcohol.  Principal Problem: Schizophrenia ([place]) Diagnosis: Principal Problem:   Schizophrenia ([place]) Active Problems:   Alcohol abuse  Total Time Spent in Direct Patient Care:  I personally spent 30 minutes on the unit in direct patient care. The direct patient care  time included face-to-face time with the patient, reviewing the patient's chart, communicating with other professionals, and coordinating care. Greater than 50% of this time was spent in counseling or coordinating care with the patient regarding goals of hospitalization, psycho-education, and discharge planning needs.  Past Psychiatric History: see admission H&P  Past Medical History:  Past Medical History:  Diagnosis Date  . Depression   . HIV (human immunodeficiency virus infection) ([place])   . Hypertension   . Immune deficiency disorder ([place])   . PE (pulmonary thromboembolism) ([place]) 2018  . Personality disorder ([place])   . Schizophrenia [facility])     Past Surgical History:  Procedure Laterality Date  . COLONOSCOPY WITH PROPOFOL N/A 06/15/2017   Procedure: COLONOSCOPY WITH PROPOFOL;  Surgeon: Irene Shipper, MD;  Location: [facility] ENDOSCOPY;  Service: Endoscopy;  Laterality: N/A;  . ESOPHAGOGASTRODUODENOSCOPY (EGD) WITH PROPOFOL N/A 06/15/2017   Procedure: ESOPHAGOGASTRODUODENOSCOPY (EGD) WITH PROPOFOL;  Surgeon: Irene Shipper, MD;  Location: [facility] ENDOSCOPY;  Service: Endoscopy;  Laterality: N/A;  . IR GENERIC HISTORICAL  11/04/2016   IR IVC FILTER PLMT / S&I Burke Keels GUID/MOD SED 11/04/2016 Aletta Edouard, MD [facility]-INTERV RAD   Family History:  Family History  Problem Relation Age of Onset  . CAD Mother   . Kidney disease Sister   . Lupus Brother   . Cancer Maternal Grandmother   . Stroke Paternal Grandmother    Family Psychiatric  History: see admission H&P  Social History:  Social History   Substance and Sexual Activity  Alcohol Use Yes   Comment: states once a month     Social History   Substance and Sexual Activity  Drug  Use Not Currently   Comment: Patient denies use, but UDS + cocaine    Social History   Socioeconomic History  . Marital status: Divorced    Spouse name: Not on file  . Number of children: Not on file  . Years of education: Not on file  . Highest education level:  Not on file  Occupational History  . Occupation: disabled  Tobacco Use  . Smoking status: Current Every Day Smoker    Packs/day: 0.50    Years: 33.00    Pack years: 16.50    Types: Cigarettes    Last attempt to quit: 04/25/2019    Years since quitting: 1.8  . Smokeless tobacco: Never Used  Vaping Use  . Vaping Use: Never used  Substance and Sexual Activity  . Alcohol use: Yes    Comment: states once a month  . Drug use: Not Currently    Comment: Patient denies use, but UDS + cocaine  . Sexual activity: Not Currently    Partners: Male, Male    Birth control/protection: Condom    Comment: no activity x 15 years  Other Topics Concern  . Not on file  Social History Narrative  . Not on file   Social Determinants of Health   Financial Resource Strain: Not on file  Food Insecurity: Not on file  Transportation Needs: Not on file  Physical Activity: Not on file  Stress: Not on file  Social Connections: Not on file   Sleep: Good  Appetite:  Improved  Current Medications: Current Facility-Administered Medications  Medication Dose Route Frequency Provider Last Rate Last Admin  . acetaminophen (TYLENOL) tablet 650 mg  650 mg Oral Q6H PRN Ajibola, Ene A, NP      . alum & mag hydroxide-simeth (MAALOX/MYLANTA) 200-200-20 MG/5ML suspension 30 mL  30 mL Oral Q4H PRN Ajibola, Ene A, NP      . amLODipine (NORVASC) tablet 5 mg  5 mg Oral Daily Onuoha, Josephine C, NP   5 mg at 02/20/21 0809  . apixaban (ELIQUIS) tablet 5 mg  5 mg Oral BID Ajibola, Ene A, NP   5 mg at 02/20/21 1717  . atorvastatin (LIPITOR) tablet 40 mg  40 mg Oral QHS Ajibola, Ene A, NP   40 mg at 02/20/21 2057  . benztropine (COGENTIN) tablet 1 mg  1 mg Oral BID Ajibola, Ene A, NP   1 mg at 02/20/21 1717  . bictegravir-emtricitabine-tenofovir AF (BIKTARVY) 50-200-25 MG per tablet 1 tablet  1 tablet Oral Daily Ajibola, Ene A, NP   1 tablet at 02/20/21 0808  . cloNIDine (CATAPRES) tablet 0.1 mg  0.1 mg Oral Q8H PRN  Viann Fish E, MD   0.1 mg at 02/19/21 2109  . gabapentin (NEURONTIN) tablet 800 mg  800 mg Oral QHS Ajibola, Ene A, NP   800 mg at 02/20/21 2057  . hydrOXYzine (ATARAX/VISTARIL) tablet 25 mg  25 mg Oral Q6H PRN Harlow Asa, MD   25 mg at 02/20/21 0813  . lisinopril (ZESTRIL) tablet 40 mg  40 mg Oral Daily Ajibola, Ene A, NP   40 mg at 02/20/21 0809  . loperamide (IMODIUM) capsule 2-4 mg  2-4 mg Oral PRN Nelda Marseille, Sorcha Rotunno E, MD      . LORazepam (ATIVAN) tablet 1 mg  1 mg Oral Q6H PRN Nelda Marseille, Jasn Xia E, MD      . magnesium hydroxide (MILK OF MAGNESIA) suspension 30 mL  30 mL Oral Daily PRN Ajibola, Ene A, NP      .  mirtazapine (REMERON) tablet 30 mg  30 mg Oral QHS Ajibola, Ene A, NP   30 mg at 02/20/21 2057  . mometasone-formoterol (DULERA) 200-5 MCG/ACT inhaler 2 puff  2 puff Inhalation BID Ajibola, Ene A, NP   2 puff at 02/20/21 2056  . montelukast (SINGULAIR) tablet 10 mg  10 mg Oral Daily Ajibola, Ene A, NP   10 mg at 02/20/21 0809  . multivitamin with minerals tablet 1 tablet  1 tablet Oral Daily Harlow Asa, MD   1 tablet at 02/20/21 0810  . ondansetron (ZOFRAN-ODT) disintegrating tablet 4 mg  4 mg Oral Q6H PRN Nelda Marseille, Burlin Mcnair E, MD      . pantoprazole (PROTONIX) EC tablet 40 mg  40 mg Oral Daily Ajibola, Ene A, NP   40 mg at 02/20/21 0809  . QUEtiapine (SEROQUEL) tablet 50 mg  50 mg Oral QHS Onuoha, Josephine C, NP   50 mg at 02/20/21 2057  . risperiDONE (RISPERDAL) tablet 2 mg  2 mg Oral BID Charmaine Downs C, NP   2 mg at 02/20/21 1703  . thiamine tablet 100 mg  100 mg Oral Daily Viann Fish E, MD   100 mg at 02/20/21 9833  . traZODone (DESYREL) tablet 50 mg  50 mg Oral QHS PRN Ajibola, Ene A, NP      . valbenazine (INGREZZA) capsule 80 mg  80 mg Oral QHS Ajibola, Ene A, NP   80 mg at 02/20/21 2056    Lab Results:  Results for orders placed or performed during the hospital encounter of 02/18/21 (from the past 48 hour(s))  TSH     Status: None   Collection Time: 02/20/21   6:22 AM  Result Value Ref Range   TSH 1.271 0.350 - 4.500 uIU/mL    Comment: Performed by a 3rd Generation assay with a functional sensitivity of <=0.01 uIU/mL. Performed at [facility], [place] [address], [place], [place] [address]   Lipid panel     Status: None   Collection Time: 02/20/21  6:22 AM  Result Value Ref Range   Cholesterol 115 0 - 200 mg/dL   Triglycerides 69 <150 mg/dL   HDL 59 >40 mg/dL   Total CHOL/HDL Ratio 1.9 RATIO   VLDL 14 0 - 40 mg/dL   LDL Cholesterol 42 0 - 99 mg/dL    Comment:        Total Cholesterol/HDL:CHD Risk Coronary Heart Disease Risk Table                     Men   Women  1/2 Average Risk   3.4   3.3  Average Risk       5.0   4.4  2 X Average Risk   9.6   7.1  3 X Average Risk  23.4   11.0        Use the calculated Patient Ratio above and the CHD Risk Table to determine the patient's CHD Risk.        ATP III CLASSIFICATION (LDL):  <100     mg/dL   Optimal  100-129  mg/dL   Near or Above                    Optimal  130-159  mg/dL   Borderline  160-189  mg/dL   High  >190     mg/dL   Very High Performed at [place] [address] [place], [place] [address]  Basic metabolic panel     Status: Abnormal   Collection Time: 02/20/21  6:22 AM  Result Value Ref Range   Sodium 139 135 - 145 mmol/L   Potassium 4.2 3.5 - 5.1 mmol/L   Chloride 111 98 - 111 mmol/L   CO2 25 22 - 32 mmol/L   Glucose, Bld 83 70 - 99 mg/dL    Comment: Glucose reference range applies only to samples taken after fasting for at least 8 hours.   BUN 20 6 - 20 mg/dL   Creatinine, Ser 0.93 0.61 - 1.24 mg/dL   Calcium 9.1 8.9 - 10.3 mg/dL   GFR, Estimated >60 >60 mL/min    Comment: (NOTE) Calculated using the CKD-EPI Creatinine Equation (2021)    Anion gap 3 (L) 5 - 15    Comment: Performed at [facility], [place] [address], [place], [place] [address]    Blood Alcohol level:  Lab Results  Component Value  Date   ETH <10 02/17/2021   ETH 40 (H) 60/45/4098    Metabolic Disorder Labs: No results found for: HGBA1C, MPG No results found for: PROLACTIN Lab Results  Component Value Date   CHOL 115 02/20/2021   TRIG 69 02/20/2021   HDL 59 02/20/2021   CHOLHDL 1.9 02/20/2021   VLDL 14 02/20/2021   LDLCALC 42 02/20/2021   LDLCALC 42 11/30/2020    Physical Findings: AIMS: Facial and Oral Movements Muscles of Facial Expression: None, normal Lips and Perioral Area: None, normal Jaw: None, normal Tongue: None, normal,Extremity Movements Upper (arms, wrists, hands, fingers): None, normal Lower (legs, knees, ankles, toes): None, normal, Trunk Movements Neck, shoulders, hips: None, normal, Overall Severity Severity of abnormal movements (highest score from questions above): None, normal Incapacitation due to abnormal movements: None, normal Patient's awareness of abnormal movements (rate only patient's report): No Awareness, Dental Status Current problems with teeth and/or dentures?: Yes Does patient usually wear dentures?: No  CIWA:  CIWA-Ar Total: 0 COWS:     Musculoskeletal: Strength & Muscle Tone: within normal limits Gait & Station: untested in bed Patient leans: N/A  Psychiatric Specialty Exam: Physical Exam Vitals reviewed.  HENT:     Head: Normocephalic.  Pulmonary:     Effort: Pulmonary effort is normal.  Neurological:     Mental Status: He is alert.     Review of Systems  Eyes: Negative for visual disturbance.  Respiratory: Negative for shortness of breath.   Cardiovascular: Negative for chest pain.  Gastrointestinal: Negative for diarrhea, nausea and vomiting.  Neurological: Positive for headaches. Negative for dizziness.    Blood pressure (!) 149/102, pulse 73, temperature 97.9 F (36.6 C), resp. rate 18, height 5\' 9"  (1.753 m), weight 59.4 kg, SpO2 99 %.Body mass index is 19.35 kg/m.  General Appearance: casually dressed, thin, appears older than stated age   Eye Contact:  Fair  Speech:  Clear and Coherent and Normal Rate  Volume:  Normal  Mood:  Depressed  Affect:  Constricted  Thought Process:  Goal Directed and Linear  Orientation:  Oriented to city, self, year and month  Thought Content:  endorses command AH but does not appear to be grossly responding to internal/external stimuli on exam; denies VH, paranoia, ideas of reference, or thought insertion/withdrawal; endorses belief in thought broadcasting  Suicidal Thoughts:  No  Homicidal Thoughts:  No  Memory:  Recent;   Fair  Judgement:  Fair  Insight:  Fair  Psychomotor Activity:  Decreased - resting in bed; occasional tongue  movement on exam but no jaw movements today; no tremors  Concentration:  Concentration: Fair and Attention Span: Fair  Recall:  [employer] of Knowledge:  Fair  Language:  Good  Akathisia:  Negative  Assets:  Communication Skills Desire for Improvement Resilience  ADL's:  Independent with prompts  Cognition:  WNL  Sleep:  Number of Hours: 6.75   Treatment Plan Summary: Diagnoses / Active Problems: Schizophrenia by hx Tardive Dyskinsia  Unspecified Depressive d/o (r/o MDD) Alcohol use d/o HIV HTN H/o DVT/PE and IVC filter placement on chronic anticoagulation Hyperlipidemia  PLAN: 1. Safety and Monitoring:  -- Involuntary admission to inpatient psychiatric unit for safety, stabilization and treatment  -- Daily contact with patient to assess and evaluate symptoms and progress in treatment  -- Patient's case to be discussed in multi-disciplinary team meeting  -- Observation Level : q15 minute checks  -- Vital signs:  q12 hours  -- Precautions: suicide  2. Psychiatric Diagnoses and Treatment:   Schizophrenia by hx  Tardive Dyskinsia   Unspecified Depressive d/o (r/o MDD) -- Attempting to get collateral from his ACTT for historical information and to confirm diagnosis -- Tapering off Seroquel onto 1 antipsychotic and will discontinue Seroquel today   -- Continue Risperdal 2mg  po bid and monitor for symptom improvement with recent dose increase -- Continue Cogentin 1mg  bid  -- Continue Ingrezza 80mg  qhs for TD  -- Continue Remeron 30mg  po qhs for mood, sleep, and appetite stimulation  -- Metabolic profile and EKG monitoring obtained while on an atypical antipsychotic (BMI:19.35 Lipid Panel: cholesterol 115, triglycerides 69, HDL 59, LDL 42;HbgA1c:pending; QTc:466ms)  -- Ensure bid for nutritional support   -- Encouraged patient to participate in unit milieu and in scheduled group therapies   -- Short Term Goals: Ability to demonstrate self-control will improve, Ability to identify and develop effective coping behaviors will improve and Ability to maintain clinical measurements within normal limits will improve  -- Long Term Goals: Improvement in symptoms so as ready for discharge   Alcohol use d/o  -- Continue CIWA protocol with Ativan 1mg  for CIWA score >1 with oral thiamine and MVI replacement (recent CIWA scores 1,1, 0,4)  -- Counseled on the need to abstain from alcohol use after discharge  -- Continue Protonix 40mg  daily for GI protection  -- Short Term Goals: Ability to identify triggers associated with substance abuse/mental health issues will improve  -- Long Term Goals: Improvement in symptoms so as ready for discharge   3. Medical Issues Being Addressed:   HIV  -- Continue Biktarvy 50/200/25 daily   Hyperlipidemia  -- Continue Lipitor 40mg  daily   HTN  -- Increase Norvasc to 10mg  daily   -- Continue Lisinopril 40mg  daily  -- Continue Clonidine 0.1mg  q8 hours PRN for SBP >160 or DBP >100   H/o PE/DVT  -- Continue Eliquis 5mg  bid  -- Continue Singulair 10mg  daily and Dulera 2 puffs bid   Thrombocytopenia  -- Platelets 132 - appears chronic (compared to previous levels of 135 2 months ago, 136 5 months ago, and 138 1 year ago)   Hypokalemia - resolved  -- Repeat BMP 02/20/21 after 1X K+ replacement on day of admission  shows K+ 4.2  4. Discharge Planning:   -- Social work and case management to assist with discharge planning and identification of hospital follow-up needs prior to discharge; follow up on APS report and with ACTT   -- Estimated LOS: 5-7 days  -- Discharge Concerns: Need to  establish a safety plan; Medication compliance and effectiveness  -- Discharge Goals: Return home with outpatient referrals for mental health follow-up including medication management/psychotherapy   Harlow Asa, MD, FAPA 02/21/2021, 6:43 AM

## 2021-02-21 NOTE — Tx Team (Signed)
Interdisciplinary Treatment and Diagnostic Plan Update  02/21/2021 Time of Session: 9:30am [name] Herring MRN: [id number]  Principal Diagnosis: Schizophrenia [facility])  Secondary Diagnoses: Principal Problem:   Schizophrenia ([place]) Active Problems:   Alcohol abuse   Current Medications:  Current Facility-Administered Medications  Medication Dose Route Frequency Provider Last Rate Last Admin  . acetaminophen (TYLENOL) tablet 650 mg  650 mg Oral Q6H PRN Ajibola, Ene A, NP      . alum & mag hydroxide-simeth (MAALOX/MYLANTA) 200-200-20 MG/5ML suspension 30 mL  30 mL Oral Q4H PRN Ajibola, Ene A, NP      . amLODipine (NORVASC) tablet 5 mg  5 mg Oral Daily Onuoha, Josephine C, NP   5 mg at 02/21/21 0808  . apixaban (ELIQUIS) tablet 5 mg  5 mg Oral BID Ajibola, Ene A, NP   5 mg at 02/21/21 0808  . atorvastatin (LIPITOR) tablet 40 mg  40 mg Oral QHS Ajibola, Ene A, NP   40 mg at 02/20/21 2057  . benztropine (COGENTIN) tablet 1 mg  1 mg Oral BID Ajibola, Ene A, NP   1 mg at 02/21/21 0807  . bictegravir-emtricitabine-tenofovir AF (BIKTARVY) 50-200-25 MG per tablet 1 tablet  1 tablet Oral Daily Ajibola, Ene A, NP   1 tablet at 02/21/21 0807  . cloNIDine (CATAPRES) tablet 0.1 mg  0.1 mg Oral Q8H PRN Viann Fish E, MD   0.1 mg at 02/19/21 2109  . gabapentin (NEURONTIN) tablet 800 mg  800 mg Oral QHS Ajibola, Ene A, NP   800 mg at 02/20/21 2057  . hydrOXYzine (ATARAX/VISTARIL) tablet 25 mg  25 mg Oral Q6H PRN Harlow Asa, MD   25 mg at 02/20/21 0813  . lisinopril (ZESTRIL) tablet 40 mg  40 mg Oral Daily Ajibola, Ene A, NP   40 mg at 02/21/21 0808  . loperamide (IMODIUM) capsule 2-4 mg  2-4 mg Oral PRN Nelda Marseille, Amy E, MD      . LORazepam (ATIVAN) tablet 1 mg  1 mg Oral Q6H PRN Nelda Marseille, Amy E, MD      . magnesium hydroxide (MILK OF MAGNESIA) suspension 30 mL  30 mL Oral Daily PRN Ajibola, Ene A, NP      . mirtazapine (REMERON) tablet 30 mg  30 mg Oral QHS Ajibola, Ene A, NP   30 mg at 02/20/21  2057  . mometasone-formoterol (DULERA) 200-5 MCG/ACT inhaler 2 puff  2 puff Inhalation BID Ajibola, Ene A, NP   2 puff at 02/21/21 0811  . montelukast (SINGULAIR) tablet 10 mg  10 mg Oral Daily Ajibola, Ene A, NP   10 mg at 02/21/21 0807  . multivitamin with minerals tablet 1 tablet  1 tablet Oral Daily Harlow Asa, MD   1 tablet at 02/21/21 0807  . ondansetron (ZOFRAN-ODT) disintegrating tablet 4 mg  4 mg Oral Q6H PRN Nelda Marseille, Amy E, MD      . pantoprazole (PROTONIX) EC tablet 40 mg  40 mg Oral Daily Ajibola, Ene A, NP   40 mg at 02/21/21 0808  . QUEtiapine (SEROQUEL) tablet 50 mg  50 mg Oral QHS Onuoha, Josephine C, NP   50 mg at 02/20/21 2057  . risperiDONE (RISPERDAL) tablet 2 mg  2 mg Oral BID Charmaine Downs C, NP   2 mg at 02/21/21 0808  . thiamine tablet 100 mg  100 mg Oral Daily Viann Fish E, MD   100 mg at 02/21/21 0807  . traZODone (DESYREL) tablet 50 mg  50 mg  Oral QHS PRN Ajibola, Ene A, NP      . valbenazine (INGREZZA) capsule 80 mg  80 mg Oral QHS Ajibola, Ene A, NP   80 mg at 02/20/21 2056   PTA Medications: Medications Prior to Admission  Medication Sig Dispense Refill Last Dose  . acetaminophen (TYLENOL) 500 MG tablet Take 1 tablet (500 mg total) by mouth every 6 (six) hours as needed for moderate pain. 30 tablet 0   . amLODipine (NORVASC) 10 MG tablet Take 1 tablet (10 mg total) by mouth daily. (Patient not taking: No sig reported) 30 tablet 0   . apixaban (ELIQUIS) 5 MG TABS tablet Take 1 tablet (5 mg total) by mouth 2 (two) times daily. 60 tablet 0   . atorvastatin (LIPITOR) 40 MG tablet Take 40 mg by mouth at bedtime.     . azithromycin (ZITHROMAX) 250 MG tablet Take 1 tablet (250 mg total) by mouth daily. Take first 2 tablets together, then 1 every day until finished. (Patient not taking: No sig reported) 6 tablet 0   . benztropine (COGENTIN) 0.5 MG tablet Take 0.5 mg by mouth 2 (two) times daily. (Patient not taking: No sig reported)     . benztropine  (COGENTIN) 1 MG tablet 1 tab in the morning 2 tabs at night (Patient taking differently: Take 1 mg by mouth 2 (two) times daily.) 14 tablet 0   . bictegravir-emtricitabine-tenofovir AF (BIKTARVY) 50-200-25 MG TABS tablet Take 1 tablet by mouth daily. 90 tablet 3   . butalbital-acetaminophen-caffeine (ESGIC) 50-325-40 MG tablet Take 1 tablet by mouth 2 (two) times daily as needed for headache. (Patient not taking: No sig reported) 30 tablet 0   . feeding supplement, ENSURE ENLIVE, (ENSURE ENLIVE) LIQD Take 237 mLs by mouth 3 (three) times daily between meals. (Patient not taking: No sig reported) 237 mL 12   . fluticasone (FLONASE) 50 MCG/ACT nasal spray Place 1-2 sprays into both nostrils daily as needed for rhinitis. (Patient not taking: No sig reported)     . gabapentin (NEURONTIN) 800 MG tablet Take 800 mg by mouth at bedtime.     . lisinopril (ZESTRIL) 40 MG tablet Take 40 mg by mouth daily.     . magnesium oxide (MAG-OX) 400 (241.3 Mg) MG tablet Take 1 tablet (400 mg total) by mouth 2 (two) times daily. (Patient not taking: No sig reported) 60 tablet 0   . metoprolol tartrate (LOPRESSOR) 25 MG tablet Take 1 tablet (25 mg total) by mouth 2 (two) times daily. (Patient not taking: No sig reported) 180 tablet 1   . mirtazapine (REMERON) 30 MG tablet Take 30 mg by mouth at bedtime.     . montelukast (SINGULAIR) 10 MG tablet Take 10 mg by mouth daily.     . naltrexone (DEPADE) 50 MG tablet Take 50 mg by mouth daily.     . nicotine polacrilex (NICORETTE) 2 MG gum Take 2 mg by mouth See admin instructions. Every 2 hours as needed for nicotine cravings     . oxyCODONE-acetaminophen (PERCOCET/ROXICET) 5-325 MG tablet Take 1 tablet by mouth every 6 (six) hours as needed for severe pain. (Patient not taking: No sig reported) 15 tablet 0   . pantoprazole (PROTONIX) 40 MG tablet Take 40 mg by mouth daily.      . polyethylene glycol (MIRALAX / GLYCOLAX) packet Take 17 g by mouth daily as needed for moderate  constipation.  (Patient not taking: No sig reported)     . QUEtiapine (SEROQUEL) 400   MG tablet Take 0.5 tablets (200 mg total) by mouth at bedtime. (Patient taking differently: Take 400 mg by mouth at bedtime.)     . RISPERDAL 1 MG tablet Take 1 mg by mouth 2 (two) times daily. Take 33m in the morning, and 266mat bedtime     . SYMBICORT 160-4.5 MCG/ACT inhaler Inhale 2 puffs into the lungs 2 (two) times daily.     . tamsulosin (FLOMAX) 0.4 MG CAPS capsule Take 1 capsule (0.4 mg total) by mouth daily after supper. (Patient not taking: No sig reported) 30 capsule 0   . tiotropium (SPIRIVA HANDIHALER) 18 MCG inhalation capsule Inhale the contents of 1 capsule via handihaler every day (Patient taking differently: Place 18 mcg into inhaler and inhale daily.) 30 capsule 0   . traZODone (DESYREL) 50 MG tablet Take 1 tablet (50 mg total) by mouth at bedtime. (Patient not taking: No sig reported) 30 tablet 3   . Valbenazine Tosylate (INGREZZA) 80 MG CAPS Take 80 mg by mouth at bedtime.       Patient Stressors: Marital or family conflict Medication change or noncompliance  Patient Strengths: GeTechnical sales engineeror treatment/growth  Treatment Modalities: Medication Management, Group therapy, Case management,  1 to 1 session with clinician, Psychoeducation, Recreational therapy.   Physician Treatment Plan for Primary Diagnosis: [place] Improvement in symptoms so as ready for discharge Improvement in symptoms so as ready for discharge   Short Term Goals: Ability to demonstrate self-control will improve Ability to identify and develop effective coping behaviors will improve Ability to maintain clinical measurements within normal limits will improve Ability to identify triggers associated with substance abuse/mental health issues will improve  Medication Management: Evaluate patient's response, side effects, and tolerance of medication  regimen.  Therapeutic Interventions: 1 to 1 sessions, Unit Group sessions and Medication administration.  Evaluation of Outcomes: Not Met  Physician Treatment Plan for Secondary Diagnosis: Principal Problem:   [place]ia [place]:   Alcohol abuse  Long Term Goal(s): Improvement in symptoms so as ready for discharge Improvement in symptoms so as ready for discharge   Short Term Goals: Ability to demonstrate self-control will improve Ability to identify and develop effective coping behaviors will improve Ability to maintain clinical measurements within normal limits will improve Ability to identify triggers associated with substance abuse/mental health issues will improve     Medication Management: Evaluate patient's response, side effects, and tolerance of medication regimen.  Therapeutic Interventions: 1 to 1 sessions, Unit Group sessions and Medication administration.  Evaluation of Outcomes: Not Met   RN Treatment Plan for Primary Diagnosis: [place]ia [place] Knowledge of disease and therapeutic regimen to maintain health will improve  Short Term Goals: Ability to remain free from injury will improve, Ability to verbalize frustration and anger appropriately will improve, Ability to participate in decision making will improve, Ability to identify and develop effective coping behaviors will improve and Compliance with prescribed medications will improve  Medication Management: RN will administer medications as ordered by provider, will assess and evaluate patient's response and provide education to patient for prescribed medication. RN will report any adverse and/or side effects to prescribing provider.  Therapeutic Interventions: 1 on 1 counseling sessions, Psychoeducation, Medication administration, Evaluate responses to treatment, Monitor vital signs and CBGs as ordered, Perform/monitor CIWA, COWS, AIMS and Fall Risk screenings as ordered,  Perform wound care treatments as ordered.  Evaluation of Outcomes: Not Met   LCSW Treatment Plan for  Primary Diagnosis: Schizophrenia ([place]) Long Term Goal(s): Safe transition to appropriate next level of care at discharge, Engage patient in therapeutic group addressing interpersonal concerns.  Short Term Goals: Engage patient in aftercare planning with referrals and resources, Increase social support, Increase ability to appropriately verbalize feelings, Identify triggers associated with mental health/substance abuse issues and Increase skills for wellness and recovery  Therapeutic Interventions: Assess for all discharge needs, 1 to 1 time with Social worker, Explore available resources and support systems, Assess for adequacy in community support network, Educate family and significant other(s) on suicide prevention, Complete Psychosocial Assessment, Interpersonal group therapy.  Evaluation of Outcomes: Not Met   Progress in Treatment: Attending groups: Yes. Participating in groups: Yes. Taking medication as prescribed: Yes. Toleration medication: Yes. Family/Significant other contact made: Yes, individual(s) contacted:  with sister Patient understands diagnosis: Yes. Discussing patient identified problems/goals with staff: Yes. Medical problems stabilized or resolved: Yes. and No. Denies suicidal/homicidal ideation: Yes. Issues/concerns per patient self-inventory: No.   New problem(s) identified: No, Describe:  none  New Short Term/Long Term Goal(s): detox, medication management for mood stabilization; elimination of SI thoughts; development of comprehensive mental wellness/sobriety plan  Patient Goals:  Did not attend  Discharge Plan or Barriers: Patient recently admitted. CSW will continue to follow and assess for appropriate referrals and possible discharge planning.    Reason for Continuation of Hospitalization: Depression Hallucinations Medication  stabilization  Estimated Length of Stay: 3-5 days  Attendees: Patient: Did not attend 02/21/2021   Physician:  02/21/2021   Nursing:  02/21/2021   RN Care Manager: 02/21/2021   Social Worker: Darletta Moll, LCSW 02/21/2021   Recreational Therapist:  02/21/2021   Other:  02/21/2021   Other:  02/21/2021  Other: 02/21/2021       Scribe for Treatment Team: Vassie Moselle, LCSW 02/21/2021 10:10 AM

## 2021-02-22 DIAGNOSIS — F200 Paranoid schizophrenia: Principal | ICD-10-CM

## 2021-02-22 LAB — HEMOGLOBIN A1C
Hgb A1c MFr Bld: 4.9 % (ref 4.8–5.6)
Mean Plasma Glucose: 94 mg/dL

## 2021-02-22 MED ORDER — ONDANSETRON 4 MG PO TBDP: 4.0000 mg | ORAL_TABLET | Freq: Four times a day (QID) | ORAL | Status: DC | PRN

## 2021-02-22 MED ORDER — METOPROLOL TARTRATE 25 MG PO TABS
25.0000 mg | ORAL_TABLET | Freq: Two times a day (BID) | ORAL | Status: DC
  Administered 2021-02-22 – 2021-02-24 (×4): 25 mg via ORAL
  Filled 2021-02-22 (×8): qty 1

## 2021-02-22 MED ORDER — LOPERAMIDE HCL 2 MG PO CAPS: 2.0000 mg | ORAL_CAPSULE | ORAL | Status: DC | PRN

## 2021-02-22 MED ORDER — HYDROXYZINE HCL 25 MG PO TABS: 25.0000 mg | ORAL_TABLET | Freq: Four times a day (QID) | ORAL | Status: DC | PRN

## 2021-02-22 MED ORDER — QUETIAPINE FUMARATE 25 MG PO TABS
25.0000 mg | ORAL_TABLET | Freq: Every day | ORAL | Status: DC
  Administered 2021-02-22 – 2021-02-23 (×2): 25 mg via ORAL
  Filled 2021-02-22 (×4): qty 1

## 2021-02-22 MED ORDER — CLONIDINE HCL 0.1 MG PO TABS: 0.2000 mg | ORAL_TABLET | Freq: Three times a day (TID) | ORAL | Status: DC | PRN

## 2021-02-22 NOTE — Progress Notes (Signed)
NUTRITION ASSESSMENT  Pt identified as at risk on the Malnutrition Screen Tool  INTERVENTION: 1. Supplements: Ensure Enlive po BID, each supplement provides 350 kcal and 20 grams of protein  NUTRITION DIAGNOSIS: Unintentional weight loss related to sub-optimal intake as evidenced by pt report.   Goal: Pt to meet >/= 90% of their estimated nutrition needs.  Monitor:  PO intake  Assessment:  Pt admitted for alcohol abuse. History of schizophrenia. Pt reports poor appetite. Ensure supplements have been ordered.   Height: Ht Readings from Last 1 Encounters:  02/18/21 5\' 9"  (1.753 m)    Weight: Wt Readings from Last 1 Encounters:  02/18/21 59.4 kg    Weight Hx: Wt Readings from Last 10 Encounters:  02/18/21 59.4 kg  02/18/21 59.9 kg  12/02/20 59.9 kg  11/30/20 59.9 kg  09/12/20 59.4 kg  05/21/20 59.4 kg  01/15/20 63.5 kg  11/14/19 60.7 kg  11/11/19 60.8 kg  11/02/19 59 kg    BMI:  Body mass index is 19.35 kg/m. Pt meets criteria for normal based on current BMI.  Estimated Nutritional Needs: Kcal: 25-30 kcal/kg Protein: > 1 gram protein/kg Fluid: 1 ml/kcal  Diet Order:  Diet Order            Diet regular Room service appropriate? Yes; Fluid consistency: Thin  Diet effective now                Pt is also offered choice of unit snacks mid-morning and mid-afternoon.  Pt is eating as desired.   Lab results and medications reviewed.   Clayton Bibles, MS, RD, LDN Inpatient Clinical Dietitian Contact information available via Amion

## 2021-02-22 NOTE — Progress Notes (Signed)
Recreation Therapy Notes  Animal-Assisted Activity (AAA) Program Checklist/Progress Notes Patient Eligibility Criteria Checklist & Daily Group note for Rec Tx Intervention  Date: 5.31.22 Time: 57 Location: [place]   AAA/T Program Assumption of Risk Form signed by Teacher, music or Parent Legal Guardian YES   Patient is free of allergies or severe asthma  YES  Patient reports no fear of animals  YES  Patient reports no history of cruelty to animals YES   Patient understands his/her participation is voluntary YES  Patient washes hands before animal contact  YES   Patient washes hands after animal contact  YES  Behavioral Response: Engaged  Education: Contractor, Appropriate Animal Interaction   Education Outcome: Acknowledges understanding/In group clarification offered/Needs additional education.   Clinical Observations/Feedback:  Pt attended and participated in group activity.    Victorino Sparrow, LRT/CTRS         Victorino Sparrow A 02/22/2021 3:42 PM

## 2021-02-22 NOTE — BHH Counselor (Signed)
CSW left a HIPAA complaint message for sister [name] [place], [place]

## 2021-02-22 NOTE — BHH Counselor (Signed)
CSW spoke with [name] Herring from [facility] and shared that APS, the pt and his sister would all like pt to find new housing. CSW and [name] Herring discussed the pt's current barriers due to him not having medicaid. Ms. [name] Herring stated that the team would begin to explore options for the pt.   [name] Herring, [place] Worker [employer]

## 2021-02-22 NOTE — Progress Notes (Signed)
   02/22/21 0900  Psych Admission Type (Psych Patients Only)  Admission Status Involuntary  Psychosocial Assessment  Patient Complaints None  Eye Contact Fair  Facial Expression Animated  Affect Appropriate to circumstance  Speech Logical/coherent  Interaction Assertive  Motor Activity Shuffling  Appearance/Hygiene Body odor;Poor hygiene;Disheveled  Behavior Characteristics Cooperative  Mood Pleasant  Thought Process  Coherency WDL  Content WDL  Delusions None reported or observed  Perception Hallucinations ("I always hear and see things")  Hallucination Auditory;Visual  Judgment Impaired  Confusion None  Danger to Self  Current suicidal ideation? Denies  Self-Injurious Behavior No self-injurious ideation or behavior indicators observed or expressed   Agreement Not to Harm Self Yes  Description of Agreement verbal contract  Danger to Others  Danger to Others None reported or observed

## 2021-02-22 NOTE — Progress Notes (Signed)
D: Patient presents with pleasant affect. Patient denies SI/HI at this time. Patient is still reporting AH/VH but states "I will always hear and see things." Patient denies hallucinations being command in nature. Patient contracts for safety.  A: Provided positive reinforcement and encouragement.  R: Patient cooperative and receptive to efforts. Patient remains safe on the unit.   02/22/21 2107  Psych Admission Type (Psych Patients Only)  Admission Status Involuntary  Psychosocial Assessment  Patient Complaints None  Eye Contact Fair  Facial Expression Animated  Affect Appropriate to circumstance  Speech Logical/coherent  Interaction Assertive  Motor Activity Shuffling  Appearance/Hygiene Improved  Behavior Characteristics Cooperative;Appropriate to situation  Mood Pleasant  Thought Process  Coherency WDL  Content WDL  Delusions None reported or observed  Perception Hallucinations  Hallucination Auditory;Visual  Judgment Impaired  Confusion None  Danger to Self  Current suicidal ideation? Denies  Self-Injurious Behavior No self-injurious ideation or behavior indicators observed or expressed   Agreement Not to Harm Self Yes  Description of Agreement verbal contract  Danger to Others  Danger to Others None reported or observed

## 2021-02-22 NOTE — Progress Notes (Signed)
[facility] Notes:  (Nursing/MHT/Case Management/Adjunct)  Date:  02/22/2021  Time: 2000 Type of Therapy:  wrap up group  Participation Level:  Active  Participation Quality:  Appropriate, Attentive, Sharing and Supportive  Affect:  Appropriate  Cognitive:  Alert  Insight:  Improving  Engagement in Group:  Engaged  Modes of Intervention:  Clarification, Education and Support  Summary of Progress/Problems: Positive thinking and positive change were discussed.   Shellia Cleverly 02/22/2021, 8:52 PM

## 2021-02-22 NOTE — Progress Notes (Signed)
Pt did not attend group. 

## 2021-02-22 NOTE — Progress Notes (Signed)
Adult Psychoeducational Group Note  Date:  02/22/2021 Time:  10:28 AM  Group Topic/Focus:  Goals Group:   The focus of this group is to help patients establish daily goals to achieve during treatment and discuss how the patient can incorporate goal setting into their daily lives to aide in recovery.  Participation Level:  Active  Participation Quality:  Attentive  Affect:  Appropriate  Cognitive:  Alert  Insight: Appropriate  Engagement in Group:  Engaged  Modes of Intervention:  Discussion  Additional Comments:  Pt attended group and participated in discussion.  Evalee Gerard R Zayna Toste 02/22/2021, 10:28 AM

## 2021-02-22 NOTE — BHH Counselor (Addendum)
CSW left a HIPPA compliant message for [facility] APS social worker Alinda Money [phone] concerning APS guardianship and discharge placement.   CSW spoke with [facility] APS social worker Alinda Money [phone] he stated he needs to staff the case with his supervisor and will be doing so today. CSW stated that if they do not take gurdainship there is no where CSW can place him due to him only having Medicare. [name] states he will call and follow up with CSW after staffing with his supervisor.   [name], [place] Worker [employer]

## 2021-02-22 NOTE — Progress Notes (Signed)
[facility] MD Progress Note  02/22/2021 12:07 PM BOLDEN HAGERMAN  MRN:  [id number] Subjective: Patient is a 60 year old male with a past psychiatric history significant for schizophrenia who was admitted on 02/18/2021 for worsening auditory hallucinations and suicidal ideation.  Objective: Patient is seen and examined.  Patient is a 60 year old male with the above-stated past psychiatric history who is seen in follow-up.  He stated he is doing better.  He stated that the auditory hallucination volume has gone down.  He stated that they never go away.  He stated that Risperdal does help his psychotic symptoms.  He denied any suicidal or homicidal ideation.  He denied any visual hallucinations.  His current medications include Cogentin, clonidine, gabapentin, mirtazapine, Risperdal and Seroquel.  He has a history of tardive dyskinesia and also on Ingrezza.  His blood pressure remains elevated.  Today it is 150/104, and repeat was 167/106.  His current blood pressure medicines include amlodipine 10 mg p.o. daily, clonidine 0.1 mg p.o. every 8 hours as needed a systolic blood pressure greater than 160, lisinopril 40 mg p.o. daily.  He does have a past medical history significant for HIV.  He is asymptomatic.  Review of the electronic medical record revealed that in 2021 he had been on Lopressor 25 mg p.o. twice daily in addition to the amlodipine and lisinopril.  It does appear from review of the electronic medical record that the metoprolol was stopped or missed on the admission orders here.  Review of his admission laboratories on 5/29 showed normal electrolytes with normal liver function enzymes and creatinine.  Lipid panel was normal.  His CBC showed an elevation of his MCV at 104.1.  Differential was normal.  Hemoglobin A1c was 4.9.  TSH was normal at 1.271.  Respiratory panel was negative for influenza A, B and coronavirus.  Blood alcohol on 5/26 was less than 10, drug screen was negative.  EKG showed a normal sinus  rhythm with a normal QTc interval.  He slept 6.75 hours last night.  Principal Problem: Schizophrenia ([place]) Diagnosis: Principal Problem:   Schizophrenia ([place]) Active Problems:   Alcohol abuse  Total Time spent with patient: 20 minutes  Past Psychiatric History: See admission H&P  Past Medical History:  Past Medical History:  Diagnosis Date  . Depression   . HIV (human immunodeficiency virus infection) ([place])   . Hypertension   . Immune deficiency disorder ([place])   . PE (pulmonary thromboembolism) ([place]) 2018  . Personality disorder ([place])   . Schizophrenia [facility])     Past Surgical History:  Procedure Laterality Date  . COLONOSCOPY WITH PROPOFOL N/A 06/15/2017   Procedure: COLONOSCOPY WITH PROPOFOL;  Surgeon: Irene Shipper, MD;  Location: [facility] ENDOSCOPY;  Service: Endoscopy;  Laterality: N/A;  . ESOPHAGOGASTRODUODENOSCOPY (EGD) WITH PROPOFOL N/A 06/15/2017   Procedure: ESOPHAGOGASTRODUODENOSCOPY (EGD) WITH PROPOFOL;  Surgeon: Irene Shipper, MD;  Location: [facility] ENDOSCOPY;  Service: Endoscopy;  Laterality: N/A;  . IR GENERIC HISTORICAL  11/04/2016   IR IVC FILTER PLMT / S&I Burke Keels GUID/MOD SED 11/04/2016 Aletta Edouard, MD [facility]-INTERV RAD   Family History:  Family History  Problem Relation Age of Onset  . CAD Mother   . Kidney disease Sister   . Lupus Brother   . Cancer Maternal Grandmother   . Stroke Paternal Grandmother    Family Psychiatric  History: See admission H&P Social History:  Social History   Substance and Sexual Activity  Alcohol Use Yes   Comment: states once a month  Social History   Substance and Sexual Activity  Drug Use Not Currently   Comment: Patient denies use, but UDS + cocaine    Social History   Socioeconomic History  . Marital status: Divorced    Spouse name: Not on file  . Number of children: Not on file  . Years of education: Not on file  . Highest education level: Not on file  Occupational History  . Occupation: disabled  Tobacco Use  .  Smoking status: Current Every Day Smoker    Packs/day: 0.50    Years: 33.00    Pack years: 16.50    Types: Cigarettes    Last attempt to quit: 04/25/2019    Years since quitting: 1.8  . Smokeless tobacco: Never Used  Vaping Use  . Vaping Use: Never used  Substance and Sexual Activity  . Alcohol use: Yes    Comment: states once a month  . Drug use: Not Currently    Comment: Patient denies use, but UDS + cocaine  . Sexual activity: Not Currently    Partners: Male, Male    Birth control/protection: Condom    Comment: no activity x 15 years  Other Topics Concern  . Not on file  Social History Narrative  . Not on file   Social Determinants of Health   Financial Resource Strain: Not on file  Food Insecurity: Not on file  Transportation Needs: Not on file  Physical Activity: Not on file  Stress: Not on file  Social Connections: Not on file   Additional Social History:                         Sleep: Good  Appetite:  Good  Current Medications: Current Facility-Administered Medications  Medication Dose Route Frequency Provider Last Rate Last Admin  . acetaminophen (TYLENOL) tablet 650 mg  650 mg Oral Q6H PRN Ajibola, Ene A, NP      . alum & mag hydroxide-simeth (MAALOX/MYLANTA) 200-200-20 MG/5ML suspension 30 mL  30 mL Oral Q4H PRN Ajibola, Ene A, NP      . amLODipine (NORVASC) tablet 10 mg  10 mg Oral Daily Nelda Marseille, Amy E, MD   10 mg at 02/22/21 0831  . apixaban (ELIQUIS) tablet 5 mg  5 mg Oral BID Ajibola, Ene A, NP   5 mg at 02/22/21 0831  . atorvastatin (LIPITOR) tablet 40 mg  40 mg Oral QHS Ajibola, Ene A, NP   40 mg at 02/21/21 2052  . benztropine (COGENTIN) tablet 1 mg  1 mg Oral BID Ajibola, Ene A, NP   1 mg at 02/22/21 0830  . bictegravir-emtricitabine-tenofovir AF (BIKTARVY) 50-200-25 MG per tablet 1 tablet  1 tablet Oral Daily Ajibola, Ene A, NP   1 tablet at 02/22/21 0830  . cloNIDine (CATAPRES) tablet 0.1 mg  0.1 mg Oral Q8H PRN Nelda Marseille, Amy E, MD    0.1 mg at 02/21/21 1128  . feeding supplement (ENSURE ENLIVE / ENSURE PLUS) liquid 237 mL  237 mL Oral BID BM Nelda Marseille, Amy E, MD   237 mL at 02/21/21 1439  . gabapentin (NEURONTIN) tablet 800 mg  800 mg Oral QHS Ajibola, Ene A, NP   800 mg at 02/21/21 2052  . lisinopril (ZESTRIL) tablet 40 mg  40 mg Oral Daily Ajibola, Ene A, NP   40 mg at 02/22/21 0830  . magnesium hydroxide (MILK OF MAGNESIA) suspension 30 mL  30 mL Oral Daily PRN Ajibola, Ene A, NP      .  mirtazapine (REMERON) tablet 30 mg  30 mg Oral QHS Ajibola, Ene A, NP   30 mg at 02/21/21 2052  . mometasone-formoterol (DULERA) 200-5 MCG/ACT inhaler 2 puff  2 puff Inhalation BID Ajibola, Ene A, NP   2 puff at 02/22/21 0830  . montelukast (SINGULAIR) tablet 10 mg  10 mg Oral Daily Ajibola, Ene A, NP   10 mg at 02/22/21 0831  . multivitamin with minerals tablet 1 tablet  1 tablet Oral Daily Harlow Asa, MD   1 tablet at 02/21/21 0807  . pantoprazole (PROTONIX) EC tablet 40 mg  40 mg Oral Daily Ajibola, Ene A, NP   40 mg at 02/22/21 0831  . QUEtiapine (SEROQUEL) tablet 50 mg  50 mg Oral QHS Charmaine Downs C, NP   50 mg at 02/21/21 2052  . risperiDONE (RISPERDAL) tablet 2 mg  2 mg Oral BID Charmaine Downs C, NP   2 mg at 02/22/21 0831  . thiamine tablet 100 mg  100 mg Oral Daily Nelda Marseille, Amy E, MD   100 mg at 02/22/21 0831  . traZODone (DESYREL) tablet 50 mg  50 mg Oral QHS PRN Ajibola, Ene A, NP   50 mg at 02/21/21 2053  . valbenazine (INGREZZA) capsule 80 mg  80 mg Oral QHS Ajibola, Ene A, NP   80 mg at 02/21/21 2052    Lab Results: No results found for this or any previous visit (from the past 48 hour(s)).  Blood Alcohol level:  Lab Results  Component Value Date   ETH <10 02/17/2021   ETH 40 (H) 40/97/3532    Metabolic Disorder Labs: Lab Results  Component Value Date   HGBA1C 4.9 02/20/2021   MPG 94 02/20/2021   No results found for: PROLACTIN Lab Results  Component Value Date   CHOL 115 02/20/2021   TRIG 69  02/20/2021   HDL 59 02/20/2021   CHOLHDL 1.9 02/20/2021   VLDL 14 02/20/2021   LDLCALC 42 02/20/2021   LDLCALC 42 11/30/2020    Physical Findings: AIMS: Facial and Oral Movements Muscles of Facial Expression: None, normal Lips and Perioral Area: None, normal Jaw: None, normal Tongue: None, normal,Extremity Movements Upper (arms, wrists, hands, fingers): None, normal Lower (legs, knees, ankles, toes): None, normal, Trunk Movements Neck, shoulders, hips: None, normal, Overall Severity Severity of abnormal movements (highest score from questions above): None, normal Incapacitation due to abnormal movements: None, normal Patient's awareness of abnormal movements (rate only patient's report): No Awareness, Dental Status Current problems with teeth and/or dentures?: Yes Does patient usually wear dentures?: No  CIWA:  CIWA-Ar Total: 4 COWS:     Musculoskeletal: Strength & Muscle Tone: within normal limits Gait & Station: shuffle Patient leans: N/A  Psychiatric Specialty Exam:  Presentation  General Appearance: Fairly Groomed  Eye Contact:Fair  Speech:Normal Rate  Speech Volume:Normal  Handedness:Right   Mood and Affect  Mood:Anxious  Affect:Congruent   Thought Process  Thought Processes:Goal Directed  Descriptions of Associations:Intact  Orientation:Full (Time, Place and Person)  Thought Content:Logical  History of Schizophrenia/Schizoaffective disorder:Yes  Duration of Psychotic Symptoms:Greater than six months  Hallucinations:Hallucinations: Auditory  Ideas of Reference:Delusions; Paranoia  Suicidal Thoughts:Suicidal Thoughts: No  Homicidal Thoughts:Homicidal Thoughts: No   Sensorium  Memory:Immediate Fair; Recent Fair; Remote Fair  Judgment:Fair  Insight:Fair   Executive Functions  Concentration:Fair  Attention Span:Fair  Gray   Psychomotor Activity  Psychomotor Activity:Psychomotor  Activity: Increased   Assets  Assets:Desire for Improvement; Resilience  Sleep  Sleep:Sleep: Good Number of Hours of Sleep: 6.75    Physical Exam: Physical Exam Vitals and nursing note reviewed.  HENT:     Head: Normocephalic and atraumatic.  Pulmonary:     Effort: Pulmonary effort is normal.  Neurological:     Mental Status: He is alert and oriented to person, place, and time.    Review of Systems  All other systems reviewed and are negative.  Blood pressure (!) 167/106, pulse 81, temperature 97.6 F (36.4 C), resp. rate 16, height 5\' 9"  (1.753 m), weight 59.4 kg, SpO2 100 %. Body mass index is 19.35 kg/m.   Treatment Plan Summary: Daily contact with patient to assess and evaluate symptoms and progress in treatment, Medication management and Plan : Patient is seen and examined.  Patient is a 60 year old male with the above-stated past psychiatric history who is seen in follow-up.   Diagnosis: 1.  Schizophrenia, paranoid 2.  Tardive dyskinesia. 3.  Alcohol use disorder 4.  HIV 5.  Hypertension 6.  History of DVT/PE with IVC filter placement and on chronic anticoagulation.  Pertinent findings on examination today: 1.  Patient stated that the auditory hallucinations are lower than previously.  He stated that they never go away. 2.  Denied suicidal or homicidal ideation. 3.  Denied any withdrawal symptoms. 4.  Review of the electronic medical record revealed previous treatment with metoprolol in addition to his other blood pressure medications.  Plan: 1.  Continue amlodipine 10 mg p.o. daily for hypertension. 2.  Continue Eliquis 5 mg p.o. twice daily for chronic anticoagulation. 3.  Continue Lipitor 40 mg p.o. nightly for hyperlipidemia. 5.  Continue Cogentin 1 mg p.o. twice daily for side effects of antipsychotics. 6.  Continue Biktarvy 50/200/25 1 tablet p.o. daily for HIV. 7.  Increase clonidine to 0.2 mg p.o. every 8 hours as needed a systolic blood pressure  greater than 102 or diastolic blood pressure greater than 100. 8.  Continue gabapentin 800 mg p.o. nightly for chronic pain, sleep as well as anxiety. 9.  Continue hydroxyzine 25 mg p.o. every 6 hours as needed anxiety or agitation. 10.  Continue lisinopril 40 mg p.o. daily for hypertension. 11.  Continue Singulair 10 mg p.o. daily for COPD/asthma. 12.  Continue mirtazapine 30 mg p.o. nightly for anxiety and depression as well as sleep and appetite stimulation. 13.  Continue Dulera 200/5 2 puffs twice daily for COPD. 14.  Continue Zofran 4 mg p.o. every 6 hours as needed nausea and vomiting. 15.  Continue Protonix 40 mg p.o. daily for gastric protection. 16.  Continue Risperdal 2 mg p.o. twice daily for psychosis. 17.  Decrease Seroquel to 25 mg p.o. nightly for psychosis. 18.  Continue Ingrezza 80 mg p.o. daily for tardive dyskinesia. 19.  Continue trazodone 50 mg p.o. nightly as needed insomnia. 20.  Start metoprolol short acting 25 mg p.o. twice daily for hypertension. 21.  patient stated that he wants to return to his previous living quarters and that he will check with them today. 22.  Disposition planning-in progress.  Sharma Covert, MD 02/22/2021, 12:07 PM

## 2021-02-22 NOTE — B[facility] Counselor (Addendum)
CSW received a message from [place] worker Alinda Money [phone] who stated he staffed the case with his supervisor and at this time they feel that pt "just needs ACTT services".   CSW called and left HIPPA compliant message for Alinda Money so that he can be informed that pt has had ACTT services for 5 plus years.    [name], [place] Worker [employer]

## 2021-02-23 DIAGNOSIS — F200 Paranoid schizophrenia: Secondary | ICD-10-CM | POA: Diagnosis not present

## 2021-02-23 MED ORDER — CLONIDINE HCL 0.2 MG PO TABS
0.2000 mg | ORAL_TABLET | Freq: Three times a day (TID) | ORAL | Status: DC
  Administered 2021-02-23 – 2021-02-24 (×4): 0.2 mg via ORAL
  Filled 2021-02-23 (×8): qty 1
  Filled 2021-02-23: qty 2
  Filled 2021-02-23 (×2): qty 1

## 2021-02-23 NOTE — [facility] Group Notes (Signed)
Topic: Emotions  Due to the acuity and complex discharge plans, group was not held. Patient was provided therapeutic worksheets and asked to meet with CSW as needed.  [name], [place] Worker [employer]

## 2021-02-23 NOTE — [facility] Counselor (Signed)
CSW spoke with pt's sister, [name] Herring. CSW informed Ms. [name] Herring that [facility] will work on finding pt a new place to live. Ms. [name] Herring expressed that she feels upset because pt is his drug supplier and is burning bridges for pt. She discussed how the pt has convinced his daughter that his son stole his things and now the daughter is sending pt's son mean texts. Ms. [name] Herring shared that she no longer wants to be contacted about pt's care.   [name], [place] Worker [employer]

## 2021-02-23 NOTE — Progress Notes (Signed)
Recreation Therapy Notes  Date: 6.1.22 Time: 0930 Location: [address] Dayroom  Group Topic: Stress Management   Goal Area(s) Addresses:  Patient will actively participate in stress management techniques presented during session.  Patient will successfully identify benefit of practicing stress management post d/c.   Intervention: Guided exercise with ambient sound and script  Activity :Guided Imagery  LRT read a beach visualization script to patients.  Patients were asked to participate in the technique introduced during introduction. Patients were given suggestions of ways to access scripts post d/c and encouraged to explore Youtube and other apps available on smartphones, tablets, and computers.   Education:  Stress Management, Discharge Planning.   Education Outcome: Acknowledges education  Clinical Observations/Feedback: Patient did not attend group session.   Victorino Sparrow, LRT/CTRS         Victorino Sparrow A 02/23/2021 12:40 PM

## 2021-02-23 NOTE — [facility] Group Notes (Signed)
The focus of this group is to help patients establish daily goals to achieve during treatment and discuss how the patient can incorporate goal setting into their daily lives to aide in recovery.  Pt did not attend group 

## 2021-02-23 NOTE — Progress Notes (Signed)
[name] MD Progress Note  02/23/2021 1:03 PM [name] Herring  MRN:  [id number]  Subjective: [name] Herring reports, "My mood is good today, it is just that I finished talking to my sister & the conversation put my mood in an awkward state because she was complaining a lot about my daughter. But I'm making it. I will not let my sister's bad attitude ruin mine today. My anxiety is #6 & depression #3. I have always heard voices, it got worst this time, it is getting better including the feeling of paranoia. I know that no one here or out there is after me".   Reason for admission: Patient is a 60 year old male with a past psychiatric history significant for schizophrenia who was admitted on 02/18/2021 for worsening auditory hallucinations and suicidal ideation.  Objective: Patient is seen and examined.  Patient is a 60 year old male with the above-stated past psychiatric history who is seen in follow-up.  He stated he is doing better.  He stated that the auditory hallucination volume has gone down.  He stated that they never go away.  He stated that Risperdal does help his psychotic symptoms.  He denied any suicidal or homicidal ideation.  He denied any visual hallucinations.  His current medications include Cogentin, clonidine, gabapentin, mirtazapine, Risperdal and Seroquel.  He has a history of tardive dyskinesia and also on Ingrezza.  His blood pressure remains elevated.  Today it is , and repeat was 100/78/106.  His current blood pressure medicines include amlodipine 10 mg p.o. daily, clonidine 0.1 mg p.o. every 8 hours as needed a systolic blood pressure greater than 160, lisinopril 40 mg p.o. daily.  He does have a past medical history significant for HIV.  He is asymptomatic.  Review of the electronic medical record revealed that in 2021 he had been on Lopressor 25 mg p.o. twice daily in addition to the amlodipine and lisinopril.  It does appear from review of the electronic medical record that the metoprolol was  stopped or missed on the admission orders here.  Review of his admission laboratories on 5/29 showed normal electrolytes with normal liver function enzymes and creatinine.  Lipid panel was normal.  His CBC showed an elevation of his MCV at 104.1.  Differential was normal.  Hemoglobin A1c was 4.9.  TSH was normal at 1.271.  Respiratory panel was negative for influenza A, B and coronavirus.  Blood alcohol on 5/26 was less than 10, drug screen was negative.  EKG showed a normal sinus rhythm with a normal QTc interval.  He slept 6.75 hours last night.  Principal Problem: Schizophrenia ([place])  Diagnosis: Principal Problem:   Schizophrenia ([place]) Active Problems:   Alcohol abuse  Total Time spent with patient: 15 minutes  Past Psychiatric History: See admission H&P  Past Medical History:  Past Medical History:  Diagnosis Date  . Depression   . HIV (human immunodeficiency virus infection) ([place])   . Hypertension   . Immune deficiency disorder ([place])   . PE (pulmonary thromboembolism) ([place]) 2018  . Personality disorder ([place])   . Schizophrenia [facility])     Past Surgical History:  Procedure Laterality Date  . COLONOSCOPY WITH PROPOFOL N/A 06/15/2017   Procedure: COLONOSCOPY WITH PROPOFOL;  Surgeon: Irene Shipper, MD;  Location: [facility] ENDOSCOPY;  Service: Endoscopy;  Laterality: N/A;  . ESOPHAGOGASTRODUODENOSCOPY (EGD) WITH PROPOFOL N/A 06/15/2017   Procedure: ESOPHAGOGASTRODUODENOSCOPY (EGD) WITH PROPOFOL;  Surgeon: Irene Shipper, MD;  Location: [facility] ENDOSCOPY;  Service: Endoscopy;  Laterality: N/A;  .  IR GENERIC HISTORICAL  11/04/2016   IR IVC FILTER PLMT / S&I Burke Keels GUID/MOD SED 11/04/2016 Aletta Edouard, MD [facility]-INTERV RAD   Family History:  Family History  Problem Relation Age of Onset  . CAD Mother   . Kidney disease Sister   . Lupus Brother   . Cancer Maternal Grandmother   . Stroke Paternal Grandmother    Family Psychiatric  History: See admission H&P  Social History:  Social History    Substance and Sexual Activity  Alcohol Use Yes   Comment: states once a month     Social History   Substance and Sexual Activity  Drug Use Not Currently   Comment: Patient denies use, but UDS + cocaine    Social History   Socioeconomic History  . Marital status: Divorced    Spouse name: Not on file  . Number of children: Not on file  . Years of education: Not on file  . Highest education level: Not on file  Occupational History  . Occupation: disabled  Tobacco Use  . Smoking status: Current Every Day Smoker    Packs/day: 0.50    Years: 33.00    Pack years: 16.50    Types: Cigarettes    Last attempt to quit: 04/25/2019    Years since quitting: 1.8  . Smokeless tobacco: Never Used  Vaping Use  . Vaping Use: Never used  Substance and Sexual Activity  . Alcohol use: Yes    Comment: states once a month  . Drug use: Not Currently    Comment: Patient denies use, but UDS + cocaine  . Sexual activity: Not Currently    Partners: Male, Male    Birth control/protection: Condom    Comment: no activity x 15 years  Other Topics Concern  . Not on file  Social History Narrative  . Not on file   Social Determinants of Health   Financial Resource Strain: Not on file  Food Insecurity: Not on file  Transportation Needs: Not on file  Physical Activity: Not on file  Stress: Not on file  Social Connections: Not on file   Additional Social History:   Sleep: Good  Appetite:  Good  Current Medications: Current Facility-Administered Medications  Medication Dose Route Frequency Provider Last Rate Last Admin  . acetaminophen (TYLENOL) tablet 650 mg  650 mg Oral Q6H PRN Ajibola, Ene A, NP   650 mg at 02/22/21 2107  . alum & mag hydroxide-simeth (MAALOX/MYLANTA) 200-200-20 MG/5ML suspension 30 mL  30 mL Oral Q4H PRN Ajibola, Ene A, NP      . amLODipine (NORVASC) tablet 10 mg  10 mg Oral Daily Nelda Marseille, Amy E, MD   10 mg at 02/23/21 0856  . apixaban (ELIQUIS) tablet 5 mg  5 mg  Oral BID Ajibola, Ene A, NP   5 mg at 02/23/21 0856  . atorvastatin (LIPITOR) tablet 40 mg  40 mg Oral QHS Ajibola, Ene A, NP   40 mg at 02/22/21 2108  . benztropine (COGENTIN) tablet 1 mg  1 mg Oral BID Ajibola, Ene A, NP   1 mg at 02/23/21 0856  . bictegravir-emtricitabine-tenofovir AF (BIKTARVY) 50-200-25 MG per tablet 1 tablet  1 tablet Oral Daily Ajibola, Ene A, NP   1 tablet at 02/23/21 0856  . cloNIDine (CATAPRES) tablet 0.2 mg  0.2 mg Oral TID Sharma Covert, MD   0.2 mg at 02/23/21 1017  . feeding supplement (ENSURE ENLIVE / ENSURE PLUS) liquid 237 mL  237 mL Oral BID  BM Harlow Asa, MD   237 mL at 02/23/21 0949  . gabapentin (NEURONTIN) tablet 800 mg  800 mg Oral QHS Ajibola, Ene A, NP   800 mg at 02/22/21 2108  . hydrOXYzine (ATARAX/VISTARIL) tablet 25 mg  25 mg Oral Q6H PRN Sharma Covert, MD      . lisinopril (ZESTRIL) tablet 40 mg  40 mg Oral Daily Ajibola, Ene A, NP   40 mg at 02/23/21 0856  . loperamide (IMODIUM) capsule 2-4 mg  2-4 mg Oral PRN Sharma Covert, MD      . magnesium hydroxide (MILK OF MAGNESIA) suspension 30 mL  30 mL Oral Daily PRN Ajibola, Ene A, NP      . metoprolol tartrate (LOPRESSOR) tablet 25 mg  25 mg Oral BID Sharma Covert, MD   25 mg at 02/23/21 0856  . mirtazapine (REMERON) tablet 30 mg  30 mg Oral QHS Ajibola, Ene A, NP   30 mg at 02/22/21 2108  . mometasone-formoterol (DULERA) 200-5 MCG/ACT inhaler 2 puff  2 puff Inhalation BID Ajibola, Ene A, NP   2 puff at 02/23/21 0855  . montelukast (SINGULAIR) tablet 10 mg  10 mg Oral Daily Ajibola, Ene A, NP   10 mg at 02/23/21 0856  . multivitamin with minerals tablet 1 tablet  1 tablet Oral Daily Harlow Asa, MD   1 tablet at 02/22/21 2108  . ondansetron (ZOFRAN-ODT) disintegrating tablet 4 mg  4 mg Oral Q6H PRN Sharma Covert, MD      . pantoprazole (PROTONIX) EC tablet 40 mg  40 mg Oral Daily Ajibola, Ene A, NP   40 mg at 02/23/21 0856  . QUEtiapine (SEROQUEL) tablet 25 mg  25 mg  Oral QHS Sharma Covert, MD   25 mg at 02/22/21 2108  . risperiDONE (RISPERDAL) tablet 2 mg  2 mg Oral BID Charmaine Downs C, NP   2 mg at 02/23/21 0856  . thiamine tablet 100 mg  100 mg Oral Daily Nelda Marseille, Amy E, MD   100 mg at 02/23/21 0856  . traZODone (DESYREL) tablet 50 mg  50 mg Oral QHS PRN Ajibola, Ene A, NP   50 mg at 02/21/21 2053  . valbenazine (INGREZZA) capsule 80 mg  80 mg Oral QHS Ajibola, Ene A, NP   80 mg at 02/22/21 2107   Lab Results: No results found for this or any previous visit (from the past 48 hour(s)).  Blood Alcohol level:  Lab Results  Component Value Date   ETH <10 02/17/2021   ETH 40 (H) 17/61/6073   Metabolic Disorder Labs: Lab Results  Component Value Date   HGBA1C 4.9 02/20/2021   MPG 94 02/20/2021   No results found for: PROLACTIN Lab Results  Component Value Date   CHOL 115 02/20/2021   TRIG 69 02/20/2021   HDL 59 02/20/2021   CHOLHDL 1.9 02/20/2021   VLDL 14 02/20/2021   LDLCALC 42 02/20/2021   LDLCALC 42 11/30/2020   Physical Findings: AIMS: Facial and Oral Movements Muscles of Facial Expression: None, normal Lips and Perioral Area: None, normal Jaw: None, normal Tongue: None, normal,Extremity Movements Upper (arms, wrists, hands, fingers): None, normal Lower (legs, knees, ankles, toes): None, normal, Trunk Movements Neck, shoulders, hips: None, normal, Overall Severity Severity of abnormal movements (highest score from questions above): None, normal Incapacitation due to abnormal movements: None, normal Patient's awareness of abnormal movements (rate only patient's report): No Awareness, Dental Status Current problems with teeth and/or  dentures?: Yes Does patient usually wear dentures?: No  CIWA:  CIWA-Ar Total: 1 COWS:     Musculoskeletal: Strength & Muscle Tone: within normal limits Gait & Station: shuffle Patient leans: N/A  Psychiatric Specialty Exam:  Presentation  General Appearance: Fairly Groomed  Eye  Contact:Fair  Speech:Normal Rate  Speech Volume:Normal  Handedness:Right  Mood and Affect  Mood:Anxious  Affect:Congruent  Thought Process  Thought Processes:Goal Directed  Descriptions of Associations:Intact  Orientation:Full (Time, Place and Person)  Thought Content:Logical  History of Schizophrenia/Schizoaffective disorder:Yes  Duration of Psychotic Symptoms:Greater than six months  Hallucinations:Hallucinations: Auditory  Ideas of Reference:Delusions; Paranoia  Suicidal Thoughts:Suicidal Thoughts: No  Homicidal Thoughts:Homicidal Thoughts: No  Sensorium  Memory:Immediate Fair; Recent Fair; Remote Fair  Judgment:Fair  Insight:Fair  Executive Functions  Concentration:Fair  Attention Span:Fair  [place]  Psychomotor Activity  Psychomotor Activity:Psychomotor Activity: Increased  Assets  Assets:Desire for Improvement; Resilience  Sleep  Sleep:Sleep: Good Number of Hours of Sleep: 6.75  Physical Exam: Physical Exam Vitals and nursing note reviewed.  HENT:     Head: Normocephalic and atraumatic.     Nose: Nose normal.     Mouth/Throat:     Pharynx: Oropharynx is clear.  Eyes:     Pupils: Pupils are equal, round, and reactive to light.  Cardiovascular:     Rate and Rhythm: Normal rate.     Pulses: Normal pulses.  Pulmonary:     Effort: Pulmonary effort is normal.  Genitourinary:    Comments: Deferred Musculoskeletal:        General: Normal range of motion.     Cervical back: Normal range of motion.  Skin:    General: Skin is warm and dry.  Neurological:     General: No focal deficit present.     Mental Status: He is alert and oriented to person, place, and time. Mental status is at baseline.    Review of Systems  Constitutional: Negative.   HENT: Negative.   Eyes: Negative.   Respiratory: Negative.   Cardiovascular: Negative.   Gastrointestinal: Negative.   Genitourinary: Negative.    Musculoskeletal: Negative.   Skin: Negative.   Neurological: Negative for dizziness, tingling, tremors, sensory change, speech change, focal weakness, seizures, loss of consciousness, weakness and headaches.  Endo/Heme/Allergies:       Allergies: Abilify, Amoxicillin,   Other: Latex.  Psychiatric/Behavioral: Positive for depression ("Improving") and substance abuse (Hx. alcohol use disorder). Negative for hallucinations, memory loss and suicidal ideas. The patient is not nervous/anxious and does not have insomnia.   All other systems reviewed and are negative.  Blood pressure 100/78, pulse (!) 58, temperature 97.7 F (36.5 C), temperature source Oral, resp. rate 16, height 5\' 9"  (1.753 m), weight 59.4 kg, SpO2 100 %. Body mass index is 19.35 kg/m.  Treatment Plan Summary: Daily contact with patient to assess and evaluate symptoms and progress in treatment, Medication management and Plan : Patient is seen and examined.  Patient is a 60 year old male with the above-stated past psychiatric history who is seen in follow-up.   Diagnosis: 1.  Schizophrenia, paranoid 2.  Tardive dyskinesia. 3.  Alcohol use disorder 4.  HIV 5.  Hypertension 6.  History of DVT/PE with IVC filter placement and on chronic anticoagulation.  Pertinent findings on examination today: 1.  Patient stated that the auditory hallucinations are lower than previously.  He stated that they never go away. 2.  Denied suicidal or homicidal ideation. 3.  Denied any withdrawal symptoms. 4.  Review of the electronic medical record revealed previous treatment with metoprolol in addition to his other blood pressure medications.  Plan: 1.   Continue amlodipine 10 mg p.o. daily for hypertension. 2.   Continue Eliquis 5 mg p.o. twice daily for chronic anticoagulation. 3.   Continue Lipitor 40 mg p.o. nightly for hyperlipidemia. 5.   Continue Cogentin 1 mg p.o. twice daily for side effects of antipsychotics. 6.   Continue  Biktarvy 50/200/25 1 tablet p.o. daily for HIV. 7.   Continue clonidine to 0.2 mg p.o. every 8 hours as needed a systolic blood pressure greater than 948 or diastolic blood pressure greater than 100. 8.    Continue gabapentin 800 mg p.o. nightly for chronic pain, sleep as well as anxiety. 9.    Continue hydroxyzine 25 mg p.o. every 6 hours as needed anxiety or agitation. 10.  Continue lisinopril 40 mg p.o. daily for hypertension. 11.  Continue Singulair 10 mg p.o. daily for COPD/asthma. 12.  Continue mirtazapine 30 mg p.o. nightly for anxiety and depression as well as sleep and appetite stimulation. 13.   Continue Dulera 200/5 2 puffs twice daily for COPD. 14.   Continue Zofran 4 mg p.o. every 6 hours as needed nausea and vomiting. 15.   Continue Protonix 40 mg p.o. daily for gastric protection. 16.   Continue Risperdal 2 mg p.o. twice daily for psychosis. 17.   Continue Seroquel to 25 mg p.o. nightly for psychosis. 18.   Continue Ingrezza 80 mg p.o. daily for tardive dyskinesia. 19.   Continue trazodone 50 mg p.o. nightly as needed insomnia. 20.   Continue metoprolol short acting 25 mg p.o. twice daily for hypertension. 21.   Patient stated that he wants to return to his previous living quarters and that he will check with them today. 22.  Disposition planning-in progress.  Lindell Spar, NP, pmhnp, fnp-bc 02/23/2021, 1:03 PMPatient ID: [name] Herring, male   DOB: 06/20/1961, 60 y.o.   MRN: [id number]

## 2021-02-23 NOTE — Progress Notes (Signed)
   02/23/21 1000  Psych Admission Type (Psych Patients Only)  Admission Status Involuntary  Psychosocial Assessment  Patient Complaints Anxiety;Depression  Eye Contact Fair  Facial Expression Sad  Affect Appropriate to circumstance  Speech Logical/coherent  Interaction Assertive  Motor Activity Shuffling  Appearance/Hygiene Improved  Behavior Characteristics Cooperative;Appropriate to situation  Mood Pleasant  Thought Process  Coherency WDL  Content WDL  Delusions None reported or observed  Perception Hallucinations  Hallucination Auditory;Visual  Judgment Impaired  Confusion None  Danger to Self  Current suicidal ideation? Denies  Self-Injurious Behavior No self-injurious ideation or behavior indicators observed or expressed   Agreement Not to Harm Self Yes  Description of Agreement verbal contract  Danger to Others  Danger to Others None reported or observed

## 2021-02-23 NOTE — Progress Notes (Signed)
[facility] Notes:  (Nursing/MHT/Case Management/Adjunct)  Date:  02/23/2021  Time:  11:48 PM  Type of Therapy:  wrap up   Participation Level:  Active  Participation Quality:  Appropriate, Sharing and Supportive  Affect:  Appropriate  Cognitive:  Alert and Appropriate  Insight:  Appropriate and Good  Engagement in Group:  Engaged  Modes of Intervention:  Problem-solving  Summary of Progress/Problems: Mr. Esker shared with the group how his day went well.  He stated he as able to talk with his daughter, son and sister.  He is waiting to see what happens on tomorrow about placement.    Blenda Mounts [place] 02/23/2021, 11:48 PM

## 2021-02-24 DIAGNOSIS — F200 Paranoid schizophrenia: Secondary | ICD-10-CM | POA: Diagnosis not present

## 2021-02-24 MED ORDER — BENZTROPINE MESYLATE 1 MG PO TABS: 1.0000 mg | ORAL_TABLET | Freq: Two times a day (BID) | ORAL | 0 refills | Status: AC

## 2021-02-24 MED ORDER — CLONIDINE HCL 0.2 MG PO TABS: 0.2000 mg | ORAL_TABLET | Freq: Three times a day (TID) | ORAL | 0 refills | Status: DC

## 2021-02-24 MED ORDER — TRAZODONE HCL 50 MG PO TABS: 50.0000 mg | ORAL_TABLET | Freq: Every evening | ORAL | 0 refills | Status: AC | PRN

## 2021-02-24 MED ORDER — QUETIAPINE FUMARATE 25 MG PO TABS: 25.0000 mg | ORAL_TABLET | Freq: Every day | ORAL | 0 refills | Status: DC

## 2021-02-24 MED ORDER — MIRTAZAPINE 30 MG PO TABS: 30.0000 mg | ORAL_TABLET | Freq: Every day | ORAL | 0 refills | Status: DC

## 2021-02-24 MED ORDER — GABAPENTIN 800 MG PO TABS: 800.0000 mg | ORAL_TABLET | Freq: Every day | ORAL | 0 refills | Status: DC

## 2021-02-24 MED ORDER — RISPERIDONE 2 MG PO TABS: 2.0000 mg | ORAL_TABLET | Freq: Two times a day (BID) | ORAL | 0 refills | Status: DC

## 2021-02-24 MED ORDER — VALBENAZINE TOSYLATE 80 MG PO CAPS: 80.0000 mg | ORAL_CAPSULE | Freq: Every day | ORAL | 0 refills | Status: AC

## 2021-02-24 MED ORDER — HYDROXYZINE HCL 25 MG PO TABS: 25.0000 mg | ORAL_TABLET | Freq: Four times a day (QID) | ORAL | 0 refills | Status: DC | PRN

## 2021-02-24 NOTE — Progress Notes (Signed)
Pt did attend nutrition group today.

## 2021-02-24 NOTE — Discharge Summary (Signed)
Physician Discharge Summary Note  Patient:  [name] Herring is an 60 y.o., male MRN:  [id number] DOB:  10/23/1960 Patient phone:  [phone] (home)  Patient address:   [place] [address],   Total Time spent with patient: Greater than 30 minutes  Date of Admission:  02/18/2021  Date of Discharge: 02-24-21  Reason for Admission: Worsening command auditory hallucinations & suicidal ideations.  Principal Problem: Schizophrenia [facility])  Discharge Diagnoses: Principal Problem:   Schizophrenia ([place]) Active Problems:   Alcohol abuse  Past Psychiatric History: Schizophrenia  Past Medical History:  Past Medical History:  Diagnosis Date  . Depression   . HIV (human immunodeficiency virus infection) ([place])   . Hypertension   . Immune deficiency disorder ([place])   . PE (pulmonary thromboembolism) ([place]) 2018  . Personality disorder ([place])   . Schizophrenia [facility])     Past Surgical History:  Procedure Laterality Date  . COLONOSCOPY WITH PROPOFOL N/A 06/15/2017   Procedure: COLONOSCOPY WITH PROPOFOL;  Surgeon: Irene Shipper, MD;  Location: [facility];  Service: Endoscopy;  Laterality: N/A;  . ESOPHAGOGASTRODUODENOSCOPY (EGD) WITH PROPOFOL N/A 06/15/2017   Procedure: ESOPHAGOGASTRODUODENOSCOPY (EGD) WITH PROPOFOL;  Surgeon: Irene Shipper, MD;  Location: [facility];  Service: Endoscopy;  Laterality: N/A;  . IR GENERIC HISTORICAL  11/04/2016   IR IVC FILTER PLMT / S&I Burke Keels GUID/MOD SED 11/04/2016 Aletta Edouard, MD [facility]-INTERV RAD   Family History:  Family History  Problem Relation Age of Onset  . CAD Mother   . Kidney disease Sister   . Lupus Brother   . Cancer Maternal Grandmother   . Stroke Paternal Grandmother    Family Psychiatric  History: See H&P  Social History:  Social History   Substance and Sexual Activity  Alcohol Use Yes   Comment: states once a month     Social History   Substance and Sexual Activity  Drug Use Not Currently   Comment:  Patient denies use, but UDS + cocaine    Social History   Socioeconomic History  . Marital status: Divorced    Spouse name: Not on file  . Number of children: Not on file  . Years of education: Not on file  . Highest education level: Not on file  Occupational History  . Occupation: disabled  Tobacco Use  . Smoking status: Current Every Day Smoker    Packs/day: 0.50    Years: 33.00    Pack years: 16.50    Types: Cigarettes    Last attempt to quit: 04/25/2019    Years since quitting: 1.8  . Smokeless tobacco: Never Used  Vaping Use  . Vaping Use: Never used  Substance and Sexual Activity  . Alcohol use: Yes    Comment: states once a month  . Drug use: Not Currently    Comment: Patient denies use, but UDS + cocaine  . Sexual activity: Not Currently    Partners: Male, Male    Birth control/protection: Condom    Comment: no activity x 15 years  Other Topics Concern  . Not on file  Social History Narrative  . Not on file   Social Determinants of Health   Financial Resource Strain: Not on file  Food Insecurity: Not on file  Transportation Needs: Not on file  Physical Activity: Not on file  Stress: Not on file  Social Connections: Not on file   Hospital Course: (Per Md's admission evaluation notes): The patient is a 60y/o male with self-reported  past psychiatric h/o schizophrenia and chronic TD and past medical history significant for HTN, HIV, and h/o DVT/PE with IVC filter placement in 2018, who was admitted under IVC for management of worsening command AH and SI. He was brought to the ED by [employer] due to concerns by his sister that he was not caring for himself or taking his mental health medications. The patient admits that for the last 3 months he has felt depressed and has not been eating well, has had hypersomnia, and has not been attending to his ADLs. He thinks he is supposed to be on Remeron but believes he has been off the medication about 2 months. He states that he  has been compliant with his home doses of Risperdal, Ingrezza, Neurontin, and Cogentin but is vague as to his home doses. He admits to recent command [place] telling him to "hurt people" and VH of "seeing dead people." He denies current SI or HI. He denies issues with ideas of reference or first rank symptoms but admits to chronic paranoia. Prior to admission he reports drinking "a lot" every other day but does not quantify an amount. He denies illicit drug use for the past 20 years. See H&P for additional details.   Prior to this discharge, Avrom was seen & evaluated for mental health stability. The current laboratory findings were reviewed (stable), nurses notes & vital signs were reviewed as well. There are no current mental health or medical issues that should prevent this discharge at this time. Patient is being discharged to continue mental health care as noted below.  After the above admission evaluation, Tally's presenting symptoms were noted. He was recommended for mood stabilization treatments. Then medication regimen targeting those presenting symptoms were discussed with him & initiated with his consent. He was medicated, stabilized & discharged on the medications as listed on his discharge medication lists below. Besides the mood stabilization treatments, He was also enrolled & participated in the group counseling sessions being offered & held on this unit. He learned coping skills. He also presented other significant pre-existing medical issues that required treatment. He was resumed & discharged on all his pertinent home medications for those health issues. He tolerated his treatment regimen without any adverse effects or reactions reported.   And because of the chronic nature of his psychiatric symptoms, their resistance to the treatment regimen in the past & hx of medication non-compliance, Darly was treated, stabilized & discharged on two separate antipsychotic medications. This is because he  has not been able to achieve symptoms control under an antipsychotic monotherapy. However, on this present admission, these combination antipsychotic therapies seem effective in stabilizing his symptoms. It will benefit Kervens to continue on these combination antipsychotic therapies as recommended till his symptoms completely subside. He then can be titrated down to an antipsychotic monotherapy to help decrease the chance for development of metabolic syndrome usually associated with use of multiple antipsychotic therapies. This has to be done within the proper evaluation, judgement & discretion of his outpatient provider.   Darick's symptoms responded well to his treatment regimen warranting this discharge. This is evidenced by her reports improved symptoms, presentation of good affect & absence of suicidal ideations. He currently presents mentally & medically stable to be discharged to continue mental health care & medication management on an outpatient as noted below. At this time of his hospital discharge, Conan is alert, attentive, well related, pleasant, mood improved & currently presents euthymic. His affect is appropriate & positively reactive, no  thought disorder noted, no suicidal or self injurious ideations reported, no homicidal or violent ideations present, no hallucinations, no delusions, not internally preoccupied. He is future oriented. His behavior on the unit was calm & in good control. He denies any medication side effects, which we reviewed with him. He will continue further mental health care & medication management on an outpatient basis as noted below. He is provided with all the necessary information needed to make this appointment without problems. Johannes was able to engage in safety planning including plan to return [facility] or contact emergency services if he feels unable to maintain her own safety or the safety of others. Pt had no further questions, comments or concerns.  He left [facility] in  no apparent distress with all personal belongings. Transportation per his Act Team.   Physical Findings: AIMS: Facial and Oral Movements Muscles of Facial Expression: None, normal Lips and Perioral Area: None, normal Jaw: None, normal Tongue: None, normal,Extremity Movements Upper (arms, wrists, hands, fingers): None, normal Lower (legs, knees, ankles, toes): None, normal, Trunk Movements Neck, shoulders, hips: None, normal, Overall Severity Severity of abnormal movements (highest score from questions above): None, normal Incapacitation due to abnormal movements: None, normal Patient's awareness of abnormal movements (rate only patient's report): No Awareness, Dental Status Current problems with teeth and/or dentures?: Yes Does patient usually wear dentures?: No  CIWA:  CIWA-Ar Total: 2 COWS:     Musculoskeletal: Strength & Muscle Tone: within normal limits Gait & Station: normal Patient leans: N/A  Psychiatric Specialty Exam:  Presentation  General Appearance: Fairly Groomed  Eye Contact:Fair  Speech:Normal Rate  Speech Volume:Normal  Handedness:Right  Mood and Affect  Mood:Euthymic  Affect:Flat  Thought Process  Thought Processes:Goal Directed  Descriptions of Associations:Intact  Orientation:Full (Time, Place and Person)  Thought Content:Logical  History of Schizophrenia/Schizoaffective disorder:Yes  Duration of Psychotic Symptoms:Greater than six months  Hallucinations:Hallucinations: Auditory  Ideas of Reference:None  Suicidal Thoughts:Suicidal Thoughts: No  Homicidal Thoughts:Homicidal Thoughts: No  Sensorium  Memory:Immediate Fair; Recent Fair; Remote Fair  Judgment:Fair  Insight:Fair  Executive Functions  Concentration:Fair  Attention Span:Good  [place]  Language:Good  Psychomotor Activity  Psychomotor Activity:Psychomotor Activity: Decreased  Assets  Assets:Desire for Improvement;  Resilience  Sleep  Sleep:Sleep: Good Number of Hours of Sleep: 6.75  Physical Exam: Physical Exam Vitals and nursing note reviewed.  HENT:     Head: Normocephalic.     Nose: Nose normal.     Mouth/Throat:     Pharynx: Oropharynx is clear.  Eyes:     Pupils: Pupils are equal, round, and reactive to light.  Cardiovascular:     Rate and Rhythm: Normal rate.     Pulses: Normal pulses.     Comments: Hx. Cardiac issues including blood clots. Pulmonary:     Effort: Pulmonary effort is normal.  Genitourinary:    Comments: Deferred Musculoskeletal:        General: Normal range of motion.     Cervical back: Normal range of motion.  Skin:    General: Skin is warm and dry.  Neurological:     General: No focal deficit present.     Mental Status: He is alert and oriented to person, place, and time. Mental status is at baseline.    Review of Systems  Constitutional: Negative.   HENT: Negative.   Eyes: Negative.   Respiratory: Negative.   Cardiovascular: Negative.   Gastrointestinal: Negative.   Genitourinary: Negative.   Musculoskeletal: Negative.  Skin: Negative.   Neurological: Negative for dizziness, tingling, tremors, sensory change, speech change, focal weakness, seizures, loss of consciousness, weakness and headaches.  Endo/Heme/Allergies:       Allergies: Amoxil, Abilify  Other: Latex.  Psychiatric/Behavioral: Positive for depression (Stable on medication.), hallucinations (Hx of Psychosis (stable on medication)) and substance abuse (Hx. alcoholism). Negative for memory loss and suicidal ideas. The patient has insomnia (Hx of (stable on medication)). The patient is not nervous/anxious (Stable upon discharge).    Blood pressure (!) 135/96, pulse 66, temperature 97.6 F (36.4 C), temperature source Oral, resp. rate 18, height 5\' 9"  (1.753 m), weight 59.4 kg, SpO2 100 %. Body mass index is 19.35 kg/m.   Have you used any form of tobacco in the last 30 days? (Cigarettes,  Smokeless Tobacco, Cigars, and/or Pipes): Yes  Has this patient used any form of tobacco in the last 30 days? (Cigarettes, Smokeless Tobacco, Cigars, and/or Pipes): Yes, an FDA-approved tobacco cessation medication was offered at discharge.  Blood Alcohol level:  Lab Results  Component Value Date   ETH <10 02/17/2021   ETH 40 (H) 82/99/3716   Metabolic Disorder Labs:  Lab Results  Component Value Date   HGBA1C 4.9 02/20/2021   MPG 94 02/20/2021   No results found for: PROLACTIN Lab Results  Component Value Date   CHOL 115 02/20/2021   TRIG 69 02/20/2021   HDL 59 02/20/2021   CHOLHDL 1.9 02/20/2021   VLDL 14 02/20/2021   LDLCALC 42 02/20/2021   LDLCALC 42 11/30/2020   See Psychiatric Specialty Exam and Suicide Risk Assessment completed by Attending Physician prior to discharge.  Discharge destination:  Home  Is patient on multiple antipsychotic therapies at discharge:  Yes,   Do you recommend tapering to monotherapy for antipsychotics?  Yes   Has Patient had three or more failed trials of antipsychotic monotherapy by history: Probably yes. Recommended Plan for Multiple Antipsychotic Therapies: Additional reason(s) for multiple antispychotic treatment:  Hx. of treatment-resistant psychosis.  Allergies as of 02/24/2021      Reactions   Amoxicillin Shortness Of Breath, Rash   Has patient had a PCN reaction causing immediate rash, facial/tongue/throat swelling, [name] Has patient had a PCN reaction causing severe rash involving mucus membranes or skin necrosis: no Has patient had a PCN reaction that required hospitalization: yes drs office visit Has patient had a PCN reaction occurring within the last 10 years: no If all of the above answers are "NO", then may proceed with Cephalosporin use.   Latex Shortness Of Breath   Abilify [aripiprazole] Other (See Comments)   Double vision      Medication List    STOP taking these medications    azithromycin 250 MG tablet Commonly known as: ZITHROMAX   butalbital-acetaminophen-caffeine 50-325-40 MG tablet Commonly known as: Esgic   magnesium oxide 400 (241.3 Mg) MG tablet Commonly known as: MAG-OX   naltrexone 50 MG tablet Commonly known as: DEPADE   oxyCODONE-acetaminophen 5-325 MG tablet Commonly known as: PERCOCET/ROXICET   pantoprazole 40 MG tablet Commonly known as: PROTONIX   polyethylene glycol 17 g packet Commonly known as: MIRALAX / GLYCOLAX   Spiriva HandiHaler 18 MCG inhalation capsule Generic drug: tiotropium   tamsulosin 0.4 MG Caps capsule Commonly known as: FLOMAX     TAKE these medications     Indication  acetaminophen 500 MG tablet Commonly known as: TYLENOL Take 1 tablet (500 mg total) by mouth every 6 (six) hours as needed for  moderate pain.  Indication: Fever, Pain   amLODipine 10 MG tablet Commonly known as: NORVASC Take 1 tablet (10 mg total) by mouth daily.  Indication: High Blood Pressure Disorder   apixaban 5 MG Tabs tablet Commonly known as: Eliquis Take 1 tablet (5 mg total) by mouth 2 (two) times daily.  Indication: Blood Clot in a Deep Vein   atorvastatin 40 MG tablet Commonly known as: LIPITOR Take 40 mg by mouth at bedtime.  Indication: High Amount of Triglycerides in the Blood   benztropine 1 MG tablet Commonly known as: COGENTIN Take 1 tablet (1 mg total) by mouth 2 (two) times daily. For eps What changed:   how much to take  how to take this  when to take this  additional instructions  Another medication with the same name was removed. Continue taking this medication, and follow the directions you see here.  Indication: Extrapyramidal Reaction caused by Medications   Biktarvy 50-200-25 MG Tabs tablet Generic drug: bictegravir-emtricitabine-tenofovir AF Take 1 tablet by mouth daily.  Indication: HIV Disease   cloNIDine 0.2 MG tablet Commonly known as: CATAPRES Take 1 tablet (0.2 mg total) by mouth 3  (three) times daily. For high blood pressure  Indication: High Blood Pressure Disorder   feeding supplement Liqd Take 237 mLs by mouth 3 (three) times daily between meals.  Indication: Nutritional supplement   fluticasone 50 MCG/ACT nasal spray Commonly known as: FLONASE Place 1-2 sprays into both nostrils daily as needed for rhinitis.  Indication: Stuffy Nose   gabapentin 800 MG tablet Commonly known as: NEURONTIN Take 1 tablet (800 mg total) by mouth at bedtime. For alcohol withdrawal syndrome What changed: additional instructions  Indication: Abuse or Misuse of Alcohol   hydrOXYzine 25 MG tablet Commonly known as: ATARAX/VISTARIL Take 1 tablet (25 mg total) by mouth every 6 (six) hours as needed (anxiety).  Indication: Feeling Anxious   lisinopril 40 MG tablet Commonly known as: ZESTRIL Take 40 mg by mouth daily.  Indication: High Blood Pressure Disorder   metoprolol tartrate 25 MG tablet Commonly known as: LOPRESSOR Take 1 tablet (25 mg total) by mouth 2 (two) times daily.  Indication: High Blood Pressure Disorder   mirtazapine 30 MG tablet Commonly known as: REMERON Take 1 tablet (30 mg total) by mouth at bedtime. For depression What changed: additional instructions  Indication: Major Depressive Disorder   montelukast 10 MG tablet Commonly known as: SINGULAIR Take 10 mg by mouth daily.  Indication: Asthma   nicotine polacrilex 2 MG gum Commonly known as: NICORETTE Take 2 mg by mouth See admin instructions. Every 2 hours as needed for nicotine cravings  Indication: Nicotine Addiction   QUEtiapine 25 MG tablet Commonly known as: SEROQUEL Take 1 tablet (25 mg total) by mouth at bedtime. For agitation What changed:   medication strength  how much to take  additional instructions  Indication: Agitation   risperiDONE 2 MG tablet Commonly known as: RISPERDAL Take 1 tablet (2 mg total) by mouth 2 (two) times daily. For mood control What changed:    medication strength  how much to take  additional instructions  Indication: Mood control   Symbicort 160-4.5 MCG/ACT inhaler Generic drug: budesonide-formoterol Inhale 2 puffs into the lungs 2 (two) times daily.  Indication: Chronic Obstructive Lung Disease   traZODone 50 MG tablet Commonly known as: DESYREL Take 1 tablet (50 mg total) by mouth at bedtime as needed for sleep. What changed:   when to take this  reasons to take  this  Indication: Trouble Sleeping   valbenazine 80 MG capsule Commonly known as: Ingrezza Take 1 capsule (80 mg total) by mouth at bedtime. For tardive dyskinesia What changed: additional instructions  Indication: Tardive Dyskinesia       Follow-up [facility] Follow up.   Why: You are established with ACTT services with this facility. They will come to your home when you discharge.  Contact information: [place] [address] [phone]              Follow-up recommendations: Activity:  As tolerated Diet: As recommended by your primary care doctor. Keep all scheduled follow-up appointments as recommended.  Comments: Prescriptions given at discharge.  Patient agreeable to plan.  Given opportunity to ask questions.  Appears to feel comfortable with discharge denies any current suicidal or homicidal thought. Patient is also instructed prior to discharge to: Take all medications as prescribed by his/her mental healthcare provider. Report any adverse effects and or reactions from the medicines to his/her outpatient provider promptly. Patient has been instructed & cautioned: To not engage in alcohol and or illegal drug use while on prescription medicines. In the event of worsening symptoms, patient is instructed to call the crisis hotline, 911 and or go to the nearest ED for appropriate evaluation and treatment of symptoms. To follow-up with his/her primary care provider for your other  medical issues, concerns and or health care needs.  Signed: Lindell Spar, NP, pmhnp, fnp-bc 02/24/2021, 10:37 AM

## 2021-02-24 NOTE — Progress Notes (Signed)
Pt did not attend goals group. 

## 2021-02-24 NOTE — Progress Notes (Signed)
  [facility] Case Management Discharge Plan :  Will you be returning to the same living situation after discharge:  Yes,  personal apartment At discharge, do you have transportation home?: Yes,  via ACTT  team Do you have the ability to pay for your medications: Yes,  medicare and SSDI  Release of information consent forms completed and in the chart;  Patient's signature needed at discharge.  Patient to Follow up at:  Follow-up [facility] Follow up.   Why: You are established with ACTT services with this facility. They will come to your home when you discharge.  Contact information: [place] [address] [phone]               Next level of care provider has access to [place] and Suicide Prevention discussed: Yes,  sister  Have you used any form of tobacco in the last 30 days? (Cigarettes, Smokeless Tobacco, Cigars, and/or Pipes): Yes  Has patient been referred to the Quitline?: Patient refused referral  Patient has been referred for addiction treatment: [place], [place] 02/24/2021, 9:40 AM

## 2021-02-24 NOTE — Progress Notes (Signed)
Pt continues to endorse auditory hallucinations which he says are always present. He has been med compliant this shift, prn Trazodone given for insomnia was effective on follow up. Fluid intake encouraged. He is resting in bed at this time, being monitored as ordered.

## 2021-02-24 NOTE — [facility] Suicide Risk Assessment (Signed)
[facility] Discharge Suicide Risk Assessment   Principal Problem: Schizophrenia [facility]) Discharge Diagnoses: Principal Problem:   Schizophrenia ([place]) Active Problems:   Alcohol abuse   Total Time spent with patient: 20 minutes  Musculoskeletal: Strength & Muscle Tone: decreased Gait & Station: shuffle Patient leans: N/A  Psychiatric Specialty Exam  Presentation  General Appearance: Fairly Groomed  Eye Contact:Fair  Speech:Normal Rate  Speech Volume:Normal  Handedness:Right   Mood and Affect  Mood:Euthymic  Duration of Depression Symptoms: Greater than two weeks  Affect:Flat   Thought Process  Thought Processes:Goal Directed  Descriptions of Associations:Intact  Orientation:Full (Time, Place and Person)  Thought Content:Logical  History of Schizophrenia/Schizoaffective disorder:Yes  Duration of Psychotic Symptoms:Greater than six months  Hallucinations:Hallucinations: Auditory  Ideas of Reference:None  Suicidal Thoughts:Suicidal Thoughts: No  Homicidal Thoughts:Homicidal Thoughts: No   Sensorium  Memory:Immediate Fair; Recent Fair; Remote Fair  Judgment:Fair  Insight:Fair   Executive Functions  Concentration:Fair  Attention Span:Good  [place]  Language:Good   Psychomotor Activity  Psychomotor Activity:Psychomotor Activity: Decreased   Assets  Assets:Desire for Improvement; Resilience   Sleep  Sleep:Sleep: Good Number of Hours of Sleep: 6.75   Physical Exam: Physical Exam Vitals and nursing note reviewed.  HENT:     Head: Normocephalic and atraumatic.  Pulmonary:     Effort: Pulmonary effort is normal.  Neurological:     Mental Status: He is alert and oriented to person, place, and time.    Review of Systems  Neurological: Positive for weakness.  Psychiatric/Behavioral: Positive for hallucinations.   Blood pressure (!) 135/96, pulse 66, temperature 97.6 F (36.4 C), temperature source Oral,  resp. rate 18, height 5\' 9"  (1.753 m), weight 59.4 kg, SpO2 100 %. Body mass index is 19.35 kg/m.  Mental Status Per Nursing Assessment::   On Admission:  NA  Demographic Factors:  Male, Low socioeconomic status, Living alone and Unemployed  Loss Factors: Financial problems/change in socioeconomic status  Historical Factors: Impulsivity  Risk Reduction Factors:   Positive therapeutic relationship  Continued Clinical Symptoms:  Schizophrenia:   Paranoid or undifferentiated type  Cognitive Features That Contribute To Risk:  Thought constriction (tunnel vision)    Suicide Risk:  Minimal: No identifiable suicidal ideation.  Patients presenting with no risk factors but with morbid ruminations; may be classified as minimal risk based on the severity of the depressive symptoms   Follow-up [facility] Follow up.   Why: You are established with ACTT services with this facility. They will come to your home when you discharge.  Contact information: [place] [address] [phone]               Plan Of Care/Follow-up recommendations:  Activity:  ad lib  Sharma Covert, MD 02/24/2021, 9:22 AM

## 2021-02-24 NOTE — Progress Notes (Signed)
Pt discharged to lobby. Pt was stable and appreciative at that time. All papers and prescriptions were given and valuables returned. Verbal understanding expressed. Denies SI/HI and A/VH. Pt given opportunity to express concerns and ask questions.  

## 2021-03-08 ENCOUNTER — Emergency Department (HOSPITAL_COMMUNITY): Payer: Medicare Other

## 2021-03-08 ENCOUNTER — Other Ambulatory Visit: Payer: Self-pay

## 2021-03-08 ENCOUNTER — Inpatient Hospital Stay (HOSPITAL_COMMUNITY)
Admission: EM | Admit: 2021-03-08 | Discharge: 2021-03-18 | DRG: 682 | Disposition: A | Discharge status: Skilled Nursing Facility | Payer: Medicare Other | Attending: Family Medicine | Admitting: Family Medicine

## 2021-03-08 DIAGNOSIS — Z681 Body mass index (BMI) 19 or less, adult: Secondary | ICD-10-CM | POA: Diagnosis not present

## 2021-03-08 DIAGNOSIS — R627 Adult failure to thrive: Secondary | ICD-10-CM | POA: Diagnosis present

## 2021-03-08 DIAGNOSIS — R64 Cachexia: Secondary | ICD-10-CM | POA: Diagnosis present

## 2021-03-08 DIAGNOSIS — Z95828 Presence of other vascular implants and grafts: Secondary | ICD-10-CM

## 2021-03-08 DIAGNOSIS — N179 Acute kidney failure, unspecified: Principal | ICD-10-CM | POA: Diagnosis present

## 2021-03-08 DIAGNOSIS — F17210 Nicotine dependence, cigarettes, uncomplicated: Secondary | ICD-10-CM | POA: Diagnosis present

## 2021-03-08 DIAGNOSIS — E039 Hypothyroidism, unspecified: Secondary | ICD-10-CM | POA: Diagnosis present

## 2021-03-08 DIAGNOSIS — I10 Essential (primary) hypertension: Secondary | ICD-10-CM | POA: Diagnosis present

## 2021-03-08 DIAGNOSIS — D75839 Thrombocytosis, unspecified: Secondary | ICD-10-CM | POA: Diagnosis present

## 2021-03-08 DIAGNOSIS — W1830XA Fall on same level, unspecified, initial encounter: Secondary | ICD-10-CM | POA: Diagnosis present

## 2021-03-08 DIAGNOSIS — F200 Paranoid schizophrenia: Secondary | ICD-10-CM | POA: Diagnosis not present

## 2021-03-08 DIAGNOSIS — J439 Emphysema, unspecified: Secondary | ICD-10-CM | POA: Diagnosis present

## 2021-03-08 DIAGNOSIS — F1010 Alcohol abuse, uncomplicated: Secondary | ICD-10-CM | POA: Diagnosis present

## 2021-03-08 DIAGNOSIS — Z91040 Latex allergy status: Secondary | ICD-10-CM

## 2021-03-08 DIAGNOSIS — E870 Hyperosmolality and hypernatremia: Secondary | ICD-10-CM | POA: Diagnosis present

## 2021-03-08 DIAGNOSIS — Z20822 Contact with and (suspected) exposure to covid-19: Secondary | ICD-10-CM | POA: Diagnosis present

## 2021-03-08 DIAGNOSIS — Z888 Allergy status to other drugs, medicaments and biological substances status: Secondary | ICD-10-CM

## 2021-03-08 DIAGNOSIS — K2970 Gastritis, unspecified, without bleeding: Secondary | ICD-10-CM | POA: Diagnosis present

## 2021-03-08 DIAGNOSIS — R21 Rash and other nonspecific skin eruption: Secondary | ICD-10-CM | POA: Diagnosis present

## 2021-03-08 DIAGNOSIS — E785 Hyperlipidemia, unspecified: Secondary | ICD-10-CM | POA: Diagnosis present

## 2021-03-08 DIAGNOSIS — Z720 Tobacco use: Secondary | ICD-10-CM | POA: Diagnosis present

## 2021-03-08 DIAGNOSIS — F209 Schizophrenia, unspecified: Secondary | ICD-10-CM | POA: Diagnosis present

## 2021-03-08 DIAGNOSIS — Z86718 Personal history of other venous thrombosis and embolism: Secondary | ICD-10-CM

## 2021-03-08 DIAGNOSIS — E871 Hypo-osmolality and hyponatremia: Secondary | ICD-10-CM | POA: Diagnosis present

## 2021-03-08 DIAGNOSIS — Z880 Allergy status to penicillin: Secondary | ICD-10-CM

## 2021-03-08 DIAGNOSIS — B20 Human immunodeficiency virus [HIV] disease: Secondary | ICD-10-CM | POA: Diagnosis present

## 2021-03-08 DIAGNOSIS — M6282 Rhabdomyolysis: Secondary | ICD-10-CM | POA: Diagnosis present

## 2021-03-08 DIAGNOSIS — F319 Bipolar disorder, unspecified: Secondary | ICD-10-CM | POA: Diagnosis present

## 2021-03-08 DIAGNOSIS — I959 Hypotension, unspecified: Secondary | ICD-10-CM | POA: Diagnosis present

## 2021-03-08 DIAGNOSIS — I2699 Other pulmonary embolism without acute cor pulmonale: Secondary | ICD-10-CM | POA: Diagnosis present

## 2021-03-08 DIAGNOSIS — E43 Unspecified severe protein-calorie malnutrition: Secondary | ICD-10-CM | POA: Diagnosis present

## 2021-03-08 DIAGNOSIS — D7589 Other specified diseases of blood and blood-forming organs: Secondary | ICD-10-CM | POA: Diagnosis present

## 2021-03-08 DIAGNOSIS — Z86711 Personal history of pulmonary embolism: Secondary | ICD-10-CM

## 2021-03-08 DIAGNOSIS — F609 Personality disorder, unspecified: Secondary | ICD-10-CM | POA: Diagnosis present

## 2021-03-08 DIAGNOSIS — Z85048 Personal history of other malignant neoplasm of rectum, rectosigmoid junction, and anus: Secondary | ICD-10-CM

## 2021-03-08 DIAGNOSIS — D696 Thrombocytopenia, unspecified: Secondary | ICD-10-CM | POA: Diagnosis present

## 2021-03-08 DIAGNOSIS — E860 Dehydration: Secondary | ICD-10-CM | POA: Diagnosis present

## 2021-03-08 DIAGNOSIS — F32A Depression, unspecified: Secondary | ICD-10-CM | POA: Diagnosis present

## 2021-03-08 DIAGNOSIS — R748 Abnormal levels of other serum enzymes: Secondary | ICD-10-CM

## 2021-03-08 DIAGNOSIS — J438 Other emphysema: Secondary | ICD-10-CM | POA: Diagnosis not present

## 2021-03-08 DIAGNOSIS — Z7901 Long term (current) use of anticoagulants: Secondary | ICD-10-CM

## 2021-03-08 DIAGNOSIS — Z7951 Long term (current) use of inhaled steroids: Secondary | ICD-10-CM

## 2021-03-08 DIAGNOSIS — Z79899 Other long term (current) drug therapy: Secondary | ICD-10-CM

## 2021-03-08 DIAGNOSIS — J449 Chronic obstructive pulmonary disease, unspecified: Secondary | ICD-10-CM | POA: Diagnosis present

## 2021-03-08 LAB — COMPREHENSIVE METABOLIC PANEL
ALT: 32 U/L (ref 0–44)
AST: 80 U/L — ABNORMAL HIGH (ref 15–41)
Albumin: 4 g/dL (ref 3.5–5.0)
Alkaline Phosphatase: 53 U/L (ref 38–126)
Anion gap: 11 (ref 5–15)
BUN: 59 mg/dL — ABNORMAL HIGH (ref 6–20)
CO2: 22 mmol/L (ref 22–32)
Calcium: 10.2 mg/dL (ref 8.9–10.3)
Chloride: 121 mmol/L — ABNORMAL HIGH (ref 98–111)
Creatinine, Ser: 2.74 mg/dL — ABNORMAL HIGH (ref 0.61–1.24)
GFR, Estimated: 26 mL/min — ABNORMAL LOW (ref >60)
Glucose, Bld: 141 mg/dL — ABNORMAL HIGH (ref 70–99)
Potassium: 4.1 mmol/L (ref 3.5–5.1)
Sodium: 154 mmol/L — ABNORMAL HIGH (ref 135–145)
Total Bilirubin: 0.8 mg/dL (ref 0.3–1.2)
Total Protein: 7.2 g/dL (ref 6.5–8.1)

## 2021-03-08 LAB — LACTIC ACID, PLASMA: Lactic Acid, Venous: 1.6 mmol/L (ref 0.5–1.9)

## 2021-03-08 LAB — HEPATIC FUNCTION PANEL
ALT: 37 U/L (ref 0–44)
AST: 83 U/L — ABNORMAL HIGH (ref 15–41)
Albumin: 4 g/dL (ref 3.5–5.0)
Alkaline Phosphatase: 49 U/L (ref 38–126)
Bilirubin, Direct: 0.1 mg/dL (ref 0.0–0.2)
Indirect Bilirubin: 0.8 mg/dL (ref 0.3–0.9)
Total Bilirubin: 0.9 mg/dL (ref 0.3–1.2)
Total Protein: 7.3 g/dL (ref 6.5–8.1)

## 2021-03-08 LAB — BASIC METABOLIC PANEL
Anion gap: 12 (ref 5–15)
BUN: 56 mg/dL — ABNORMAL HIGH (ref 6–20)
CO2: 25 mmol/L (ref 22–32)
Calcium: 10.2 mg/dL (ref 8.9–10.3)
Chloride: 115 mmol/L — ABNORMAL HIGH (ref 98–111)
Creatinine, Ser: 2.17 mg/dL — ABNORMAL HIGH (ref 0.61–1.24)
GFR, Estimated: 34 mL/min — ABNORMAL LOW (ref >60)
Glucose, Bld: 109 mg/dL — ABNORMAL HIGH (ref 70–99)
Potassium: 4.3 mmol/L (ref 3.5–5.1)
Sodium: 152 mmol/L — ABNORMAL HIGH (ref 135–145)

## 2021-03-08 LAB — URINALYSIS, ROUTINE W REFLEX MICROSCOPIC
Bacteria, UA: NONE SEEN
Bilirubin Urine: NEGATIVE
Glucose, UA: NEGATIVE mg/dL
Hgb urine dipstick: NEGATIVE
Ketones, ur: 20 mg/dL — AB
Leukocytes,Ua: NEGATIVE
Nitrite: NEGATIVE
Protein, ur: 30 mg/dL — AB
Specific Gravity, Urine: 1.026 (ref 1.005–1.030)
pH: 5 (ref 5.0–8.0)

## 2021-03-08 LAB — CBC WITH DIFFERENTIAL/PLATELET
Abs Immature Granulocytes: 0.03 10*3/uL (ref 0.00–0.07)
Basophils Absolute: 0 10*3/uL (ref 0.0–0.1)
Basophils Relative: 0 %
Eosinophils Absolute: 0 10*3/uL (ref 0.0–0.5)
Eosinophils Relative: 0 %
HCT: 48.4 % (ref 39.0–52.0)
Hemoglobin: 15.5 g/dL (ref 13.0–17.0)
Immature Granulocytes: 0 %
Lymphocytes Relative: 15 %
Lymphs Abs: 1 10*3/uL (ref 0.7–4.0)
MCH: 34.1 pg — ABNORMAL HIGH (ref 26.0–34.0)
MCHC: 32 g/dL (ref 30.0–36.0)
MCV: 106.6 fL — ABNORMAL HIGH (ref 80.0–100.0)
Monocytes Absolute: 0.6 10*3/uL (ref 0.1–1.0)
Monocytes Relative: 9 %
Neutro Abs: 5.2 10*3/uL (ref 1.7–7.7)
Neutrophils Relative %: 76 %
Platelets: 126 10*3/uL — ABNORMAL LOW (ref 150–400)
RBC: 4.54 MIL/uL (ref 4.22–5.81)
RDW: 13.1 % (ref 11.5–15.5)
WBC: 6.8 10*3/uL (ref 4.0–10.5)
nRBC: 0 % (ref 0.0–0.2)

## 2021-03-08 LAB — PROTIME-INR
INR: 1.2 (ref 0.8–1.2)
Prothrombin Time: 15.1 seconds (ref 11.4–15.2)

## 2021-03-08 LAB — I-STAT CHEM 8, ED
BUN: 54 mg/dL — ABNORMAL HIGH (ref 6–20)
Calcium, Ion: 1.25 mmol/L (ref 1.15–1.40)
Chloride: 122 mmol/L — ABNORMAL HIGH (ref 98–111)
Creatinine, Ser: 2.9 mg/dL — ABNORMAL HIGH (ref 0.61–1.24)
Glucose, Bld: 144 mg/dL — ABNORMAL HIGH (ref 70–99)
HCT: 45 % (ref 39.0–52.0)
Hemoglobin: 15.3 g/dL (ref 13.0–17.0)
Potassium: 4 mmol/L (ref 3.5–5.1)
Sodium: 158 mmol/L — ABNORMAL HIGH (ref 135–145)
TCO2: 25 mmol/L (ref 22–32)

## 2021-03-08 LAB — CK
Total CK: 2185 U/L — ABNORMAL HIGH (ref 49–397)
Total CK: 1979 U/L — ABNORMAL HIGH (ref 49–397)

## 2021-03-08 LAB — RESP PANEL BY RT-PCR (FLU A&B, COVID) ARPGX2
Influenza A by PCR: NEGATIVE
Influenza B by PCR: NEGATIVE
SARS Coronavirus 2 by RT PCR: NEGATIVE

## 2021-03-08 LAB — SEDIMENTATION RATE: Sed Rate: 16 mm/hr (ref 0–16)

## 2021-03-08 LAB — URIC ACID: Uric Acid, Serum: 9.9 mg/dL — ABNORMAL HIGH (ref 3.7–8.6)

## 2021-03-08 LAB — C-REACTIVE PROTEIN: CRP: 1.5 mg/dL — ABNORMAL HIGH (ref <1.0)

## 2021-03-08 LAB — TSH: TSH: 0.688 u[IU]/mL (ref 0.350–4.500)

## 2021-03-08 LAB — FOLATE: Folate: 33.8 ng/mL (ref >5.9)

## 2021-03-08 LAB — APTT: aPTT: 25 seconds (ref 24–36)

## 2021-03-08 LAB — MAGNESIUM: Magnesium: 2.9 mg/dL — ABNORMAL HIGH (ref 1.7–2.4)

## 2021-03-08 LAB — VITAMIN B12: Vitamin B-12: 1777 pg/mL — ABNORMAL HIGH (ref 180–914)

## 2021-03-08 MED ORDER — LACTATED RINGERS IV SOLN: INTRAVENOUS | Status: DC

## 2021-03-08 MED ORDER — ACETAMINOPHEN 650 MG RE SUPP: 650.0000 mg | Freq: Four times a day (QID) | RECTAL | Status: DC | PRN

## 2021-03-08 MED ORDER — NICOTINE 14 MG/24HR TD PT24
14.0000 mg | MEDICATED_PATCH | Freq: Every day | TRANSDERMAL | Status: DC
  Administered 2021-03-08 – 2021-03-18 (×11): 14 mg via TRANSDERMAL
  Filled 2021-03-08 (×11): qty 1

## 2021-03-08 MED ORDER — BENZTROPINE MESYLATE 1 MG PO TABS
1.0000 mg | ORAL_TABLET | Freq: Two times a day (BID) | ORAL | Status: DC
Start: 2021-03-08 — End: 2021-03-19
  Administered 2021-03-08 – 2021-03-18 (×20): 1 mg via ORAL
  Filled 2021-03-08 (×21): qty 1

## 2021-03-08 MED ORDER — MIRTAZAPINE 15 MG PO TABS
15.0000 mg | ORAL_TABLET | Freq: Every day | ORAL | Status: DC
  Administered 2021-03-08 – 2021-03-17 (×10): 15 mg via ORAL
  Filled 2021-03-08 (×10): qty 1

## 2021-03-08 MED ORDER — APIXABAN 5 MG PO TABS
5.0000 mg | ORAL_TABLET | Freq: Two times a day (BID) | ORAL | Status: DC
Start: 2021-03-08 — End: 2021-03-19
  Administered 2021-03-08 – 2021-03-18 (×20): 5 mg via ORAL
  Filled 2021-03-08 (×19): qty 1

## 2021-03-08 MED ORDER — VALBENAZINE TOSYLATE 40 MG PO CAPS
80.0000 mg | ORAL_CAPSULE | Freq: Every day | ORAL | Status: DC
  Administered 2021-03-08 – 2021-03-17 (×10): 80 mg via ORAL
  Filled 2021-03-08 (×11): qty 2

## 2021-03-08 MED ORDER — LACTATED RINGERS IV SOLN: INTRAVENOUS | Status: AC

## 2021-03-08 MED ORDER — ADULT MULTIVITAMIN W/MINERALS CH
1.0000 | ORAL_TABLET | Freq: Every day | ORAL | Status: DC
  Administered 2021-03-08 – 2021-03-17 (×11): 1 via ORAL
  Filled 2021-03-08 (×11): qty 1

## 2021-03-08 MED ORDER — RISPERIDONE 2 MG PO TABS
2.0000 mg | ORAL_TABLET | Freq: Two times a day (BID) | ORAL | Status: DC
  Administered 2021-03-08 – 2021-03-18 (×20): 2 mg via ORAL
  Filled 2021-03-08 (×21): qty 1

## 2021-03-08 MED ORDER — GABAPENTIN 600 MG PO TABS
600.0000 mg | ORAL_TABLET | Freq: Every day | ORAL | Status: DC
  Administered 2021-03-08 – 2021-03-17 (×10): 600 mg via ORAL
  Filled 2021-03-08 (×10): qty 1

## 2021-03-08 MED ORDER — ACETAMINOPHEN 325 MG PO TABS
650.0000 mg | ORAL_TABLET | Freq: Four times a day (QID) | ORAL | Status: DC | PRN
  Administered 2021-03-11 – 2021-03-17 (×6): 650 mg via ORAL
  Filled 2021-03-08 (×5): qty 2

## 2021-03-08 MED ORDER — LACTATED RINGERS IV BOLUS
1000.0000 mL | Freq: Once | INTRAVENOUS | Status: AC
  Administered 2021-03-08: 1000 mL via INTRAVENOUS

## 2021-03-08 MED ORDER — THIAMINE HCL 100 MG PO TABS
100.0000 mg | ORAL_TABLET | Freq: Every day | ORAL | Status: DC
  Administered 2021-03-08 – 2021-03-18 (×11): 100 mg via ORAL
  Filled 2021-03-08 (×11): qty 1

## 2021-03-08 MED ORDER — HYDROXYZINE HCL 25 MG PO TABS
25.0000 mg | ORAL_TABLET | Freq: Four times a day (QID) | ORAL | Status: DC | PRN
  Administered 2021-03-08 – 2021-03-09 (×2): 25 mg via ORAL
  Filled 2021-03-08 (×2): qty 1

## 2021-03-08 MED ORDER — ALBUTEROL SULFATE (2.5 MG/3ML) 0.083% IN NEBU: 2.5000 mg | INHALATION_SOLUTION | Freq: Four times a day (QID) | RESPIRATORY_TRACT | Status: DC | PRN

## 2021-03-08 MED ORDER — MONTELUKAST SODIUM 10 MG PO TABS
10.0000 mg | ORAL_TABLET | Freq: Every day | ORAL | Status: DC
  Administered 2021-03-09 – 2021-03-18 (×10): 10 mg via ORAL
  Filled 2021-03-08 (×10): qty 1

## 2021-03-08 MED ORDER — FOLIC ACID 1 MG PO TABS
1.0000 mg | ORAL_TABLET | Freq: Every day | ORAL | Status: DC
  Administered 2021-03-08 – 2021-03-18 (×11): 1 mg via ORAL
  Filled 2021-03-08 (×11): qty 1

## 2021-03-08 MED ORDER — TRAZODONE HCL 50 MG PO TABS
50.0000 mg | ORAL_TABLET | Freq: Every evening | ORAL | Status: DC | PRN
  Filled 2021-03-08: qty 1

## 2021-03-08 MED ORDER — MOMETASONE FURO-FORMOTEROL FUM 200-5 MCG/ACT IN AERO
2.0000 | INHALATION_SPRAY | Freq: Two times a day (BID) | RESPIRATORY_TRACT | Status: DC
  Administered 2021-03-09 – 2021-03-18 (×19): 2 via RESPIRATORY_TRACT
  Filled 2021-03-08: qty 8.8

## 2021-03-08 MED ORDER — FLUTICASONE PROPIONATE 50 MCG/ACT NA SUSP
1.0000 | Freq: Every day | NASAL | Status: DC | PRN
Start: 2021-03-08 — End: 2021-03-19

## 2021-03-08 NOTE — ED Notes (Signed)
Called [facility] for second tray to be sent to pt

## 2021-03-08 NOTE — ED Triage Notes (Signed)
Patient BIB [facility] from home. Pt's daughter performed own welfare check because she hadn't heard from the patient in about two weeks. Pt was found at home and found to have the home inhabitable with bugs everywhere. Pt has been unable to eat for about 2 days because he's been nauseated and having abdominal pain. Per EMS the patient's son and sister are supposed to be caring for him, but they reported to have stolen his EBT card, per patient however, who is [employer], they did not steal his EBT card, he just hasn't been able to eat. Pt's VS as follows: BP initially 82/42 after fluid- 102 /60  Pulse- 104-110 sinus tach 30-40 Resp  169 CBG.   Pt did fall last night because he felt so dizzy. Pt unsure if he hit his head but is complaining of neck and R arm pain. Pt is on eliquis. Threasa Alpha, [facility] at bedside.

## 2021-03-08 NOTE — ED Notes (Signed)
Attempted to call 5C, no answer.

## 2021-03-08 NOTE — ED Notes (Signed)
Pt had bowel movement on self. Pt cleaned. Perineal care performed and full linen change.

## 2021-03-08 NOTE — ED Provider Notes (Signed)
[facility] EMERGENCY DEPARTMENT Provider Note   CSN: [id number] Arrival date & time: 03/08/21  1022     History Chief Complaint  Patient presents with   Failure To Thrive   Code Sepsis    [name] Herring is a 60 y.o. male presenting for evaluation of weakness, decreased appetite, fall.   Pt states he has a corn appetite for the past several days.  Not eating or drinking in 2 days.  Yesterday he got dizzy and fell, hitting his head.  He did not lose consciousness.  He reports he continues to feel dizzy today, which he states feels like he is lightheaded.  No room spinning or feeling off balance.  He denies fevers, chills, cough, chest pain, shortness of breath, nausea, vomiting, abdominal pain, urinary symptoms, abnormal bowel movements. He lives at home by himself, family checks in on him.   Additional history obtained from EMS/triage note.  Per note, patient's daughter had not heard from patient about 2 weeks.  When patient was checked on, his home was in disarray with bugs.  Per daughter, patient's son and sister supposed be caring for him.  Daughter has filed APS report.  HPI     Past Medical History:  Diagnosis Date   Depression    HIV (human immunodeficiency virus infection) ([place])    Hypertension    Immune deficiency disorder ([place])    PE (pulmonary thromboembolism) ([place]) 2018   Personality disorder ([place])    Schizophrenia ([place])     Patient Active Problem List   Diagnosis Date Noted   Hypernatremia 03/08/2021   Schizophrenia ([place]) 02/18/2021   AKI (acute kidney injury) ([place]) 01/15/2020   History of pulmonary embolism 01/15/2020   Genital warts 10/16/2019   GERD with esophagitis 01/22/2019   Protein C deficiency ([place]) 07/05/2017   Benign neoplasm of transverse colon    Emphysema lung ([place]) 11/20/2016   DVT of lower extremity, bilateral ([place]) 11/04/2016   Tobacco abuse 11/02/2016   HTN (hypertension) 08/25/2015   Alcohol use disorder, severe, in  early remission ([place]) 12/27/2014   Alcohol abuse 09/26/2014   Human immunodeficiency virus (HIV) disease ([place]) 11/09/2006   CA IN SITU, RECTUM 11/09/2006   Hypothyroidism 11/09/2006   Depression 11/09/2006    Past Surgical History:  Procedure Laterality Date   COLONOSCOPY WITH PROPOFOL N/A 06/15/2017   Procedure: COLONOSCOPY WITH PROPOFOL;  Surgeon: Irene Shipper, MD;  Location: [facility] ENDOSCOPY;  Service: Endoscopy;  Laterality: N/A;   ESOPHAGOGASTRODUODENOSCOPY (EGD) WITH PROPOFOL N/A 06/15/2017   Procedure: ESOPHAGOGASTRODUODENOSCOPY (EGD) WITH PROPOFOL;  Surgeon: Irene Shipper, MD;  Location: [facility] ENDOSCOPY;  Service: Endoscopy;  Laterality: N/A;   IR GENERIC HISTORICAL  11/04/2016   IR IVC FILTER PLMT / S&I Burke Keels GUID/MOD SED 11/04/2016 Aletta Edouard, MD [facility]-INTERV RAD       Family History  Problem Relation Age of Onset   CAD Mother    Kidney disease Sister    Lupus Brother    Cancer Maternal Grandmother    Stroke Paternal Grandmother     Social History   Tobacco Use   Smoking status: Every Day    Packs/day: 0.50    Years: 33.00    Pack years: 16.50    Types: Cigarettes    Last attempt to quit: 04/25/2019    Years since quitting: 1.8   Smokeless tobacco: Never  Vaping Use   Vaping Use: Never used  Substance Use Topics   Alcohol use: Yes    Comment:  states once a month   Drug use: Not Currently    Comment: Patient denies use, but UDS + cocaine    Home Medications Prior to Admission medications   Medication Sig Start Date End Date Taking? Authorizing Provider  acetaminophen (TYLENOL) 500 MG tablet Take 1 tablet (500 mg total) by mouth every 6 (six) hours as needed for moderate pain. 11/07/16   Eugenie Filler, MD  amLODipine (NORVASC) 10 MG tablet Take 1 tablet (10 mg total) by mouth daily. Patient not taking: No sig reported 01/20/20   Elgergawy, Silver Huguenin, MD  apixaban (ELIQUIS) 5 MG TABS tablet Take 1 tablet (5 mg total) by mouth 2 (two) times daily. 06/15/20   [place] Callas, NP  atorvastatin (LIPITOR) 40 MG tablet Take 40 mg by mouth at bedtime. 02/05/21   [provider]  benztropine (COGENTIN) 1 MG tablet Take 1 tablet (1 mg total) by mouth 2 (two) times daily. For eps 02/24/21   Lindell Spar I, NP  bictegravir-emtricitabine-tenofovir AF (BIKTARVY) 50-200-25 MG TABS tablet Take 1 tablet by mouth daily. 05/21/20   [place] Callas, NP  cloNIDine (CATAPRES) 0.2 MG tablet Take 1 tablet (0.2 mg total) by mouth 3 (three) times daily. For high blood pressure 02/24/21   [place], Herbert Pun I, NP  feeding supplement, ENSURE ENLIVE, (ENSURE ENLIVE) LIQD Take 237 mLs by mouth 3 (three) times daily between meals. Patient not taking: No sig reported 01/19/20   Elgergawy, Silver Huguenin, MD  fluticasone (FLONASE) 50 MCG/ACT nasal spray Place 1-2 sprays into both nostrils daily as needed for rhinitis. Patient not taking: No sig reported 11/01/19   [provider]  gabapentin (NEURONTIN) 800 MG tablet Take 1 tablet (800 mg total) by mouth at bedtime. For alcohol withdrawal syndrome 02/24/21   Lindell Spar I, NP  hydrOXYzine (ATARAX/VISTARIL) 25 MG tablet Take 1 tablet (25 mg total) by mouth every 6 (six) hours as needed (anxiety). 02/24/21   Lindell Spar I, NP  lisinopril (ZESTRIL) 40 MG tablet Take 40 mg by mouth daily. 07/30/19   [provider]  metoprolol tartrate (LOPRESSOR) 25 MG tablet Take 1 tablet (25 mg total) by mouth 2 (two) times daily. Patient not taking: No sig reported 08/07/18   Charlott Rakes, MD  mirtazapine (REMERON) 30 MG tablet Take 1 tablet (30 mg total) by mouth at bedtime. For depression 02/24/21   Lindell Spar I, NP  montelukast (SINGULAIR) 10 MG tablet Take 10 mg by mouth daily. 10/02/19   [provider]  nicotine polacrilex (NICORETTE) 2 MG gum Take 2 mg by mouth See admin instructions. Every 2 hours as needed for nicotine cravings    [provider]  QUEtiapine (SEROQUEL) 25 MG tablet Take 1 tablet (25 mg total) by mouth at  bedtime. For agitation 02/24/21   Lindell Spar I, NP  risperiDONE (RISPERDAL) 2 MG tablet Take 1 tablet (2 mg total) by mouth 2 (two) times daily. For mood control 02/24/21   Lindell Spar I, NP  SYMBICORT 160-4.5 MCG/ACT inhaler Inhale 2 puffs into the lungs 2 (two) times daily. 10/03/19   [provider]  traZODone (DESYREL) 50 MG tablet Take 1 tablet (50 mg total) by mouth at bedtime as needed for sleep. 02/24/21   Lindell Spar I, NP  valbenazine (INGREZZA) 80 MG capsule Take 1 capsule (80 mg total) by mouth at bedtime. For tardive dyskinesia 02/24/21   Lindell Spar I, NP  cetirizine (ZYRTEC) 10 MG tablet Take 1 tablet (10 mg total)  by mouth daily. Patient not taking: Reported on 10/22/2019 02/26/18 11/15/19  Charlott Rakes, MD  haloperidol (HALDOL) 10 MG tablet Take 0.5 tablets (5 mg total) by mouth 2 (two) times daily. Patient not taking: Reported on 10/22/2019 06/18/17 11/15/19  Domenic Polite, MD    Allergies    Amoxicillin, Latex, and Abilify [aripiprazole]  Review of Systems   Review of Systems  Constitutional:  Positive for appetite change.  Neurological:  Positive for dizziness, weakness, light-headedness and headaches.  All other systems reviewed and are negative.  Physical Exam Updated Vital Signs BP (!) 123/92   Pulse 69   Temp 97.8 F (36.6 C) (Oral)   Resp (!) 26   Ht 5\' 8"  (1.727 m)   Wt 58 kg   SpO2 97%   BMI 19.44 kg/m   Physical Exam Vitals and nursing note reviewed.  Constitutional:      General: He is not in acute distress.    Appearance: He is cachectic.     Comments: Appears malnourished. Not in acute distress  HENT:     Head: Normocephalic and atraumatic.  Eyes:     Extraocular Movements: Extraocular movements intact.     Conjunctiva/sclera: Conjunctivae normal.     Pupils: Pupils are equal, round, and reactive to light.  Cardiovascular:     Rate and Rhythm: Normal rate and regular rhythm.     Pulses: Normal pulses.  Pulmonary:     Effort: Pulmonary  effort is normal. No respiratory distress.     Breath sounds: Normal breath sounds. No wheezing.     Comments: Speaking in full sentences.  Clear lung sounds in all fields. Abdominal:     General: There is no distension.     Palpations: Abdomen is soft. There is no mass.     Tenderness: There is no abdominal tenderness. There is no guarding or rebound.  Musculoskeletal:        General: Normal range of motion.     Cervical back: Normal range of motion and neck supple.  Skin:    General: Skin is warm and dry.     Capillary Refill: Capillary refill takes less than 2 seconds.     Findings: Rash present.     Comments: Multiple skin lesions, see pictures below  Neurological:     Mental Status: He is alert and oriented to person, place, and time.  Psychiatric:        Mood and Affect: Mood and affect normal.        Speech: Speech normal.        Behavior: Behavior normal.           ED Results / Procedures / Treatments   Labs (all labs ordered are listed, but only abnormal results are displayed) Labs Reviewed  COMPREHENSIVE METABOLIC PANEL - Abnormal; Notable for the following components:      Result Value   Sodium 154 (*)    Chloride 121 (*)    Glucose, Bld 141 (*)    BUN 59 (*)    Creatinine, Ser 2.74 (*)    AST 80 (*)    GFR, Estimated 26 (*)    All other components within normal limits  CBC WITH DIFFERENTIAL/PLATELET - Abnormal; Notable for the following components:   MCV 106.6 (*)    MCH 34.1 (*)    Platelets 126 (*)    All other components within normal limits  CK - Abnormal; Notable for the following components:   Total CK 2,185 (*)  All other components within normal limits  I-STAT CHEM 8, ED - Abnormal; Notable for the following components:   Sodium 158 (*)    Chloride 122 (*)    BUN 54 (*)    Creatinine, Ser 2.90 (*)    Glucose, Bld 144 (*)    All other components within normal limits  URINE CULTURE  CULTURE, BLOOD (ROUTINE X 2)  CULTURE, BLOOD (ROUTINE  X 2)  RESP PANEL BY RT-PCR (FLU A&B, COVID) ARPGX2  LACTIC ACID, PLASMA  PROTIME-INR  APTT  URINALYSIS, ROUTINE W REFLEX MICROSCOPIC    EKG EKG Interpretation  Date/Time:  Tuesday March 08 2021 11:16:57 EDT Ventricular Rate:  83 PR Interval:  128 QRS Duration: 108 QT Interval:  375 QTC Calculation: 441 R Axis:   74 Text Interpretation: Sinus rhythm Probable left ventricular hypertrophy Confirmed by Sherwood Gambler [phone]) on 03/08/2021 12:56:27 PM  Radiology CT Head Wo Contrast  Result Date: 03/08/2021 CLINICAL DATA:  Head trauma failure to thrive EXAM: CT HEAD WITHOUT CONTRAST CT CERVICAL SPINE WITHOUT CONTRAST TECHNIQUE: Multidetector CT imaging of the head and cervical spine was performed following the standard protocol without intravenous contrast. Multiplanar CT image reconstructions of the cervical spine were also generated. COMPARISON:  CT head 10/29/2020 FINDINGS: CT HEAD FINDINGS Brain: Generalized atrophy without hydrocephalus. Mild white matter hypodensity bilaterally, stable from prior. Negative for acute infarct, hemorrhage, mass Vascular: Negative for hyperdense vessel Skull: Negative Sinuses/Orbits: Cyst left maxillary sinus.  Negative orbit Other: None CT CERVICAL SPINE FINDINGS Alignment: Slight retrolisthesis at C4-5. Straightening of the cervical lordosis Skull base and vertebrae: Negative for fracture Soft tissues and spinal canal: Negative Disc levels: Mild disc degeneration and spurring most prominent at C4-5. Upper chest: Severe apical emphysema without acute abnormality. Other: None IMPRESSION: 1. No acute intracranial abnormality. Mild atrophy and mild chronic microvascular ischemia 2. Negative for cervical spine fracture 3. Severe emphysema Electronically Signed   By: Franchot Gallo M.D.   On: 03/08/2021 13:19   CT Cervical Spine Wo Contrast  Result Date: 03/08/2021 CLINICAL DATA:  Head trauma failure to thrive EXAM: CT HEAD WITHOUT CONTRAST CT CERVICAL SPINE  WITHOUT CONTRAST TECHNIQUE: Multidetector CT imaging of the head and cervical spine was performed following the standard protocol without intravenous contrast. Multiplanar CT image reconstructions of the cervical spine were also generated. COMPARISON:  CT head 10/29/2020 FINDINGS: CT HEAD FINDINGS Brain: Generalized atrophy without hydrocephalus. Mild white matter hypodensity bilaterally, stable from prior. Negative for acute infarct, hemorrhage, mass Vascular: Negative for hyperdense vessel Skull: Negative Sinuses/Orbits: Cyst left maxillary sinus.  Negative orbit Other: None CT CERVICAL SPINE FINDINGS Alignment: Slight retrolisthesis at C4-5. Straightening of the cervical lordosis Skull base and vertebrae: Negative for fracture Soft tissues and spinal canal: Negative Disc levels: Mild disc degeneration and spurring most prominent at C4-5. Upper chest: Severe apical emphysema without acute abnormality. Other: None IMPRESSION: 1. No acute intracranial abnormality. Mild atrophy and mild chronic microvascular ischemia 2. Negative for cervical spine fracture 3. Severe emphysema Electronically Signed   By: Franchot Gallo M.D.   On: 03/08/2021 13:19   DG Chest Port 1 View  Result Date: 03/08/2021 CLINICAL DATA:  Cough for 2 days EXAM: PORTABLE CHEST 1 VIEW COMPARISON:  12/02/2020 FINDINGS: Cardiac shadow is stable. Lungs are well aerated bilaterally. No focal infiltrate or sizable effusion is seen. No bony abnormality is noted. IMPRESSION: No acute abnormality seen. Electronically Signed   By: Inez Catalina M.D.   On: 03/08/2021 11:15    Procedures .  Critical Care  Date/Time: 03/08/2021 12:15 PM Performed by: Franchot Heidelberg, PA-C Authorized by: Franchot Heidelberg, PA-C   Critical care provider statement:    Critical care time (minutes):  45   Critical care time was exclusive of:  Separately billable procedures and treating other patients and teaching time   Critical care was necessary to treat or prevent  imminent or life-threatening deterioration of the following conditions:  Dehydration and renal failure   Critical care was time spent personally by me on the following activities:  Blood draw for specimens, development of treatment plan with patient or surrogate, evaluation of patient's response to treatment, examination of patient, obtaining history from patient or surrogate, ordering and performing treatments and interventions, ordering and review of laboratory studies, ordering and review of radiographic studies, pulse oximetry, re-evaluation of patient's condition and review of old charts   I assumed direction of critical care for this patient from another provider in my specialty: no     Care discussed with: admitting provider     Medications Ordered in ED Medications  lactated ringers infusion ( Intravenous New Bag/Given 03/08/21 1246)  lactated ringers bolus 1,000 mL (0 mLs Intravenous Stopped 03/08/21 1246)    ED Course  I have reviewed the triage vital signs and the nursing notes.  Pertinent labs & imaging results that were available during my care of the patient were reviewed by me and considered in my medical decision making (see chart for details).    MDM Rules/Calculators/A&P                          Patient presented for evaluation of weakness, dizziness, loss of appetite, and a fall.  On exam, patient appears cachectic, but not in acute distress.  Blood pressures are soft, but MAP is stable.  No tachycardia or fever to indicate infectious cause such as sepsis.  Likely dehydration due to decreased p.o. intake.  As patient fell, will obtain chest x-ray, CT head, CT neck.  Labs ordered including lactic, blood cultures, and CK.  Chest x-ray viewed and independently interpreted by me, no pneumonia pneumothorax or effusion.  CT head and neck negative for acute findings.  Labs concerning for dehydration with hypernatremia, elevated creatinine and BUN.  No leukocytosis and lactic is  negative, doubt sepsis.  IV fluids given, patient will need to be admitted to the hospital.  Discussed with Dr. Rogers Blocker from [facility] hospitalist service, pt to be admitted   Final Clinical Impression(s) / ED Diagnoses Final diagnoses:  Dehydration  AKI (acute kidney injury) ([place])  Hypernatremia  Failure to thrive in adult  Elevated CK    Rx / DC Orders ED Discharge Orders     None        Franchot Heidelberg, PA-C 03/08/21 1354    Sherwood Gambler, MD 03/08/21 1519

## 2021-03-08 NOTE — H&P (Signed)
History and Physical    [name] Herring ZOX:[id number] DOB: Jan 25, 1961 DOA: 03/08/2021  PCP: Sonia Side., FNP Consultants:   Janene Madeira: HIV Patient coming from:  Home. Lives alone  Chief Complaint: "daughter made me come in and my right arm and leg hurt after fall.   HPI: [name] Herring is a 60 y.o. male with medical history significant of HIV, HTN, hx of PE/DVT on eliquis and IVC filter placement in 2018, emphysema, depression, personality disorder,  tobacco abuse (1/2 PPD), schizophrenia, alcohol abuse (has not had a drink in 2 months) and gastric ucler he thinks 3 years ago who presents to ER after fall x 3 days ago. He states he got dizzy and fell, hitting his right arm and leg. He thinks he hit his head. He had no LOC. He states he is light headed, but not dizzy. He can move his right arm and leg fine. He is right handed.  He has no fever, no N/V/D, no headache, shortness of breath. He does have a dry cough when he uses nicorette gum. He also has not eaten anything for 2 days. Has drank water and coffee. He states he is starving, can't tell me why he didn't eat. Has very dark urine and states he is sore.  Typically independent with all ADLs.  Called His daughter. She came to check on him after not hearing from him in 2 weeks and found bugs everywhere and called EMS. She didn't like how he looked and told her he had not eaten, "in awhile." She said there were all different kind of bugs. Daughter did not call APS, but contacted his Education officer, museum. [name] stays in [place] and thought his aunt was checking on him.   Recently admitted at behavior health for worsening comman auditory hallucination and suicidal ideations. HE does not endorse this today.    ED Course: hypotensive in the 80s with EMS, given IVF and blood pressures now normotensive in ER.   Review of Systems: As per HPI; otherwise review of systems reviewed and negative.   Ambulatory Status:  Ambulates with walker.    COVID Vaccine Status:  x2 vaccines with pfizer.   Past Medical History:  Diagnosis Date   Depression    HIV (human immunodeficiency virus infection) ([place])    Hypertension    Immune deficiency disorder ([place])    PE (pulmonary thromboembolism) ([place]) 2018   Personality disorder ([place])    Schizophrenia ([place])     Past Surgical History:  Procedure Laterality Date   COLONOSCOPY WITH PROPOFOL N/A 06/15/2017   Procedure: COLONOSCOPY WITH PROPOFOL;  Surgeon: Irene Shipper, MD;  Location: [facility];  Service: Endoscopy;  Laterality: N/A;   ESOPHAGOGASTRODUODENOSCOPY (EGD) WITH PROPOFOL N/A 06/15/2017   Procedure: ESOPHAGOGASTRODUODENOSCOPY (EGD) WITH PROPOFOL;  Surgeon: Irene Shipper, MD;  Location: [facility];  Service: Endoscopy;  Laterality: N/A;   IR GENERIC HISTORICAL  11/04/2016   IR IVC FILTER PLMT / S&I Burke Keels GUID/MOD SED 11/04/2016 Aletta Edouard, MD [facility]-INTER[facility] RAD    Social History   Socioeconomic History   Marital status: Divorced    Spouse name: Not on file   Number of children: Not on file   Years of education: Not on file   Highest education level: Not on file  Occupational History   Occupation: disabled  Tobacco Use   Smoking status: Every Day    Packs/day: 0.50    Years: 33.00    Pack years: 16.50    Types: Cigarettes  Last attempt to quit: 04/25/2019    Years since quitting: 1.8   Smokeless tobacco: Never  Vaping Use   Vaping Use: Never used  Substance and Sexual Activity   Alcohol use: Yes    Comment: states once a month   Drug use: Not Currently    Comment: Patient denies use, but UDS + cocaine   Sexual activity: Not Currently    Partners: Male, Male    Birth control/protection: Condom    Comment: no activity x 15 years  Other Topics Concern   Not on file  Social History Narrative   Not on file   Social Determinants of Health   Financial Resource Strain: Not on file  Food Insecurity: Not on file  Transportation Needs: Not on file  Physical  Activity: Not on file  Stress: Not on file  Social Connections: Not on file  Intimate Partner Violence: Not on file    Allergies  Allergen Reactions   Amoxicillin Shortness Of Breath and Rash    Has patient had a PCN reaction causing immediate rash, facial/tongue/throat swelling, [name] Has patient had a PCN reaction causing severe rash involving mucus membranes or skin necrosis: no Has patient had a PCN reaction that required hospitalization: yes drs office visit Has patient had a PCN reaction occurring within the last 10 years: no If all of the above answers are "NO", then may proceed with Cephalosporin use.    Latex Shortness Of Breath   Abilify [Aripiprazole] Other (See Comments)    Double vision    Family History  Problem Relation Age of Onset   CAD Mother    Kidney disease Sister    Lupus Brother    Cancer Maternal Grandmother    Stroke Paternal Grandmother     Prior to Admission medications   Medication Sig Start Date End Date Taking? Authorizing Provider  acetaminophen (TYLENOL) 500 MG tablet Take 1 tablet (500 mg total) by mouth every 6 (six) hours as needed for moderate pain. 11/07/16   Eugenie Filler, MD  amLODipine (NORVASC) 10 MG tablet Take 1 tablet (10 mg total) by mouth daily. Patient not taking: No sig reported 01/20/20   Elgergawy, Silver Huguenin, MD  apixaban (ELIQUIS) 5 MG TABS tablet Take 1 tablet (5 mg total) by mouth 2 (two) times daily. 06/15/20   [place] Callas, NP  atorvastatin (LIPITOR) 40 MG tablet Take 40 mg by mouth at bedtime. 02/05/21   [provider]  benztropine (COGENTIN) 1 MG tablet Take 1 tablet (1 mg total) by mouth 2 (two) times daily. For eps 02/24/21   Lindell Spar I, NP  bictegravir-emtricitabine-tenofovir AF (BIKTARVY) 50-200-25 MG TABS tablet Take 1 tablet by mouth daily. 05/21/20   [place] Callas, NP  cloNIDine (CATAPRES) 0.2 MG tablet Take 1 tablet (0.2 mg total) by mouth 3 (three) times  daily. For high blood pressure 02/24/21   Nwoko, Herbert Pun I, NP  feeding supplement, ENSURE ENLIVE, (ENSURE ENLIVE) LIQD Take 237 mLs by mouth 3 (three) times daily between meals. Patient not taking: No sig reported 01/19/20   Elgergawy, Silver Huguenin, MD  fluticasone (FLONASE) 50 MCG/ACT nasal spray Place 1-2 sprays into both nostrils daily as needed for rhinitis. Patient not taking: No sig reported 11/01/19   [provider]  gabapentin (NEURONTIN) 800 MG tablet Take 1 tablet (800 mg total) by mouth at bedtime. For alcohol withdrawal syndrome 02/24/21   Lindell Spar I, NP  hydrOXYzine (ATARAX/VISTARIL) 25 MG tablet  Take 1 tablet (25 mg total) by mouth every 6 (six) hours as needed (anxiety). 02/24/21   Lindell Spar I, NP  lisinopril (ZESTRIL) 40 MG tablet Take 40 mg by mouth daily. 07/30/19   [provider]  metoprolol tartrate (LOPRESSOR) 25 MG tablet Take 1 tablet (25 mg total) by mouth 2 (two) times daily. Patient not taking: No sig reported 08/07/18   Charlott Rakes, MD  mirtazapine (REMERON) 30 MG tablet Take 1 tablet (30 mg total) by mouth at bedtime. For depression 02/24/21   Lindell Spar I, NP  montelukast (SINGULAIR) 10 MG tablet Take 10 mg by mouth daily. 10/02/19   [provider]  nicotine polacrilex (NICORETTE) 2 MG gum Take 2 mg by mouth See admin instructions. Every 2 hours as needed for nicotine cravings    [provider]  QUEtiapine (SEROQUEL) 25 MG tablet Take 1 tablet (25 mg total) by mouth at bedtime. For agitation 02/24/21   Lindell Spar I, NP  risperiDONE (RISPERDAL) 2 MG tablet Take 1 tablet (2 mg total) by mouth 2 (two) times daily. For mood control 02/24/21   Lindell Spar I, NP  SYMBICORT 160-4.5 MCG/ACT inhaler Inhale 2 puffs into the lungs 2 (two) times daily. 10/03/19   [provider]  traZODone (DESYREL) 50 MG tablet Take 1 tablet (50 mg total) by mouth at bedtime as needed for sleep. 02/24/21   Lindell Spar I, NP  valbenazine (INGREZZA) 80 MG  capsule Take 1 capsule (80 mg total) by mouth at bedtime. For tardive dyskinesia 02/24/21   Lindell Spar I, NP  cetirizine (ZYRTEC) 10 MG tablet Take 1 tablet (10 mg total) by mouth daily. Patient not taking: Reported on 10/22/2019 02/26/18 11/15/19  Charlott Rakes, MD  haloperidol (HALDOL) 10 MG tablet Take 0.5 tablets (5 mg total) by mouth 2 (two) times daily. Patient not taking: Reported on 10/22/2019 06/18/17 11/15/19  Domenic Polite, MD    Physical Exam: Vitals:   03/08/21 1230 03/08/21 1245 03/08/21 1315 03/08/21 1415  BP: 101/81 112/75 (!) 123/92 113/86  Pulse: 67 69    Resp: 18 18 (!) 26 (!) 23  Temp:      TempSrc:      SpO2: 96% 97%    Weight:      Height:         General:  Appears calm and comfortable and is in NAD. cachectic Eyes:  PERRL, EOMI, normal lids, iris ENT:  grossly normal hearing, lips & tongue, dry mucous membranes; poor dentition Neck:  no LAD, masses or thyromegaly; no carotid bruits Cardiovascular:  RRR, no m/r/g. No LE edema.  Respiratory:   CTA bilaterally with no wheezes/rales/rhonchi.  Normal respiratory effort. Abdomen:  soft, NT, ND, NABS Back:   normal alignment, no CVAT Skin:  no rash or induration seen on limited exam. Multiple skin lesions/lacerations over right arm. Pics taken in ER. No areas that typically present as scabies.  Musculoskeletal:  grossly normal tone BUE/BLE, good ROM, no bony abnormality Lower extremity:  No LE edema.  Limited foot exam with no ulcerations.  2+ distal pulses. Psychiatric:  grossly normal mood and affect, speech fluent and appropriate, AOx3 Neurologic:  CN 2-12 grossly intact, moves all extremities in coordinated fashion, sensation intact    Radiological Exams on Admission: Independently reviewed - see discussion in A/P where applicable  CT Head Wo Contrast  Result Date: 03/08/2021 CLINICAL DATA:  Head trauma failure to thrive EXAM: CT HEAD WITHOUT CONTRAST CT CERVICAL SPINE WITHOUT CONTRAST TECHNIQUE:  Multidetector CT imaging of the head and cervical spine was performed following the standard protocol without intravenous contrast. Multiplanar CT image reconstructions of the cervical spine were also generated. COMPARISON:  CT head 10/29/2020 FINDINGS: CT HEAD FINDINGS Brain: Generalized atrophy without hydrocephalus. Mild white matter hypodensity bilaterally, stable from prior. Negative for acute infarct, hemorrhage, mass Vascular: Negative for hyperdense vessel Skull: Negative Sinuses/Orbits: Cyst left maxillary sinus.  Negative orbit Other: None CT CERVICAL SPINE FINDINGS Alignment: Slight retrolisthesis at C4-5. Straightening of the cervical lordosis Skull base and vertebrae: Negative for fracture Soft tissues and spinal canal: Negative Disc levels: Mild disc degeneration and spurring most prominent at C4-5. Upper chest: Severe apical emphysema without acute abnormality. Other: None IMPRESSION: 1. No acute intracranial abnormality. Mild atrophy and mild chronic microvascular ischemia 2. Negative for cervical spine fracture 3. Severe emphysema Electronically Signed   By: Franchot Gallo M.D.   On: 03/08/2021 13:19   CT Cervical Spine Wo Contrast  Result Date: 03/08/2021 CLINICAL DATA:  Head trauma failure to thrive EXAM: CT HEAD WITHOUT CONTRAST CT CERVICAL SPINE WITHOUT CONTRAST TECHNIQUE: Multidetector CT imaging of the head and cervical spine was performed following the standard protocol without intravenous contrast. Multiplanar CT image reconstructions of the cervical spine were also generated. COMPARISON:  CT head 10/29/2020 FINDINGS: CT HEAD FINDINGS Brain: Generalized atrophy without hydrocephalus. Mild white matter hypodensity bilaterally, stable from prior. Negative for acute infarct, hemorrhage, mass Vascular: Negative for hyperdense vessel Skull: Negative Sinuses/Orbits: Cyst left maxillary sinus.  Negative orbit Other: None CT CERVICAL SPINE FINDINGS Alignment: Slight retrolisthesis at C4-5.  Straightening of the cervical lordosis Skull base and vertebrae: Negative for fracture Soft tissues and spinal canal: Negative Disc levels: Mild disc degeneration and spurring most prominent at C4-5. Upper chest: Severe apical emphysema without acute abnormality. Other: None IMPRESSION: 1. No acute intracranial abnormality. Mild atrophy and mild chronic microvascular ischemia 2. Negative for cervical spine fracture 3. Severe emphysema Electronically Signed   By: Franchot Gallo M.D.   On: 03/08/2021 13:19   DG Chest Port 1 View  Result Date: 03/08/2021 CLINICAL DATA:  Cough for 2 days EXAM: PORTABLE CHEST 1 VIEW COMPARISON:  12/02/2020 FINDINGS: Cardiac shadow is stable. Lungs are well aerated bilaterally. No focal infiltrate or sizable effusion is seen. No bony abnormality is noted. IMPRESSION: No acute abnormality seen. Electronically Signed   By: Inez Catalina M.D.   On: 03/08/2021 11:15    EKG: Independently reviewed.  NSR with rate 83; nonspecific ST changes with no evidence of acute ischemia   Labs on Admission: I have personally reviewed the available labs and imaging studies at the time of the admission.  Pertinent labs:  LA: normal  Sodium: 154---> 158---> MCV: 106 Platelets: 126 CK: 2185----> Creatinine: 2.74--->2.90 Chloride: 122  Assessment/Plan Principal Problem:   Dehydration -has not eaten or drank much x 2 weeks.  -bolused and continue IVF hydration and oral hydration -serial bmps -social service consult.     AKI (acute kidney injury) ([place]) -Patient without baseline compromised renal function  -Likely due to prerenal secondary to severe dehydration -IVF -Check FeNa and US-renal if not improved with IVF.  -Follow up renal function by BMP -Avoid ACEI and NSAIDs -hold HIV medication due to GFR contraindication     Hypernatremia -likely secondary to dehydration. Patient asymptomatic.  -IVF and following serial bmp q 4 hours x 3.     Rhabdomyolysis -UA pending, but  very darkish/red in room characteristic of myoglobinuria.  -initial CK: 2185--->repeat  pending  -uric acid and inflammatory markers pending  -IVF at 150cc/hour -Likely secondary to poor PO intake vs. Medication.  -held Seroquel and statin  -Hepatic function panel/CK/mag q 12 hours.  -Push PO fluids if able -Strict I/Os -Vital signs q4h    Human immunodeficiency virus (HIV) disease ([place]) -followed outpatient by ID. Last viral load: 58. CD4>200 in 11/2020.  -holding HIV medication in setting of acute kidney injury, contraindicated -blood and urine cultures pending     Hypothyroidism -TSH pending. No medication.     HTN (hypertension) -on multiple agents. Did not order any in light of hypotension and AKI.  -will need to restart if renal function improves and blood pressure elevates, especially clonidine so no rebound HTN.  On clonidine, norvasc, lisinopril and lopressor.     Emphysema lung ([place]) -continue home inhaler and albuterol prn    History of pulmonary embolism/DVT -continue eliquis     Depression -continue home medication.     Alcohol abuse -has not drank in 2 months. I did start him on thiamine, MV and folate.  -no signs of withdrawal.  -on gabapentin for this, I renally dosed gabapentin down to 600mg .     Tobacco abuse -nicotine patch  Thrombocytosis -hx of low platelets. Continue to monitor -consider outpatient follow up if needed.   Macrocytosis B12/folate pending    Schizophrenia ([place])  -continued all home meds except for his seroquel in light of elevated CK.   -possible neglect Social service consult. Daughter wants him to stay with her in [place] and would like to be contacted daily Skin care ordered for abrasions from fall.   Body mass index is 19.44 kg/m.     Level of care: Telemetry Medical DVT prophylaxis:  eliquis Code Status:  Full - confirmed with patient Family Communication: None present. Called Dmitri Pettigrew:  [phone] Disposition Plan:  The patient is from: home   Anticipated d/c date will depend on clinical response to treatment and resolution of acute issues as well as social aspects.  Consults called:  none Admission status:  inpatient    Orma Flaming MD [facility]   How to contact the [facility] Attending or Consulting provider [place] or covering provider during after hours [place], for this patient?  Check the care team in [facility] and look for a) attending/consulting TR[facility] provider listed and b) the [facility] team listed Log into [email or web address] and use [place]'s universal password to access. If you do not have the password, please contact the hospital operator. Locate the [place] provider you are looking for under [facility] and page to a number that you can be directly reached. If you still have difficulty reaching the provider, please page the [facility] (Director on Call) for the Hospitalists listed on amion for assistance.   03/08/2021, 4:30 PM

## 2021-03-09 LAB — COMPREHENSIVE METABOLIC PANEL
ALT: 33 U/L (ref 0–44)
AST: 63 U/L — ABNORMAL HIGH (ref 15–41)
Albumin: 3 g/dL — ABNORMAL LOW (ref 3.5–5.0)
Alkaline Phosphatase: 41 U/L (ref 38–126)
Anion gap: 5 (ref 5–15)
BUN: 40 mg/dL — ABNORMAL HIGH (ref 6–20)
CO2: 26 mmol/L (ref 22–32)
Calcium: 9.3 mg/dL (ref 8.9–10.3)
Chloride: 115 mmol/L — ABNORMAL HIGH (ref 98–111)
Creatinine, Ser: 1.55 mg/dL — ABNORMAL HIGH (ref 0.61–1.24)
GFR, Estimated: 51 mL/min — ABNORMAL LOW (ref >60)
Glucose, Bld: 97 mg/dL (ref 70–99)
Potassium: 3.9 mmol/L (ref 3.5–5.1)
Sodium: 146 mmol/L — ABNORMAL HIGH (ref 135–145)
Total Bilirubin: 0.6 mg/dL (ref 0.3–1.2)
Total Protein: 5.7 g/dL — ABNORMAL LOW (ref 6.5–8.1)

## 2021-03-09 LAB — BASIC METABOLIC PANEL
Anion gap: 7 (ref 5–15)
BUN: 47 mg/dL — ABNORMAL HIGH (ref 6–20)
CO2: 27 mmol/L (ref 22–32)
Calcium: 9.6 mg/dL (ref 8.9–10.3)
Chloride: 113 mmol/L — ABNORMAL HIGH (ref 98–111)
Creatinine, Ser: 1.91 mg/dL — ABNORMAL HIGH (ref 0.61–1.24)
GFR, Estimated: 40 mL/min — ABNORMAL LOW (ref >60)
Glucose, Bld: 124 mg/dL — ABNORMAL HIGH (ref 70–99)
Potassium: 3.9 mmol/L (ref 3.5–5.1)
Sodium: 147 mmol/L — ABNORMAL HIGH (ref 135–145)

## 2021-03-09 LAB — CBC
HCT: 38.6 % — ABNORMAL LOW (ref 39.0–52.0)
Hemoglobin: 12.7 g/dL — ABNORMAL LOW (ref 13.0–17.0)
MCH: 34.3 pg — ABNORMAL HIGH (ref 26.0–34.0)
MCHC: 32.9 g/dL (ref 30.0–36.0)
MCV: 104.3 fL — ABNORMAL HIGH (ref 80.0–100.0)
Platelets: UNDETERMINED 10*3/uL (ref 150–400)
RBC: 3.7 MIL/uL — ABNORMAL LOW (ref 4.22–5.81)
RDW: 12.8 % (ref 11.5–15.5)
WBC: 6.1 10*3/uL (ref 4.0–10.5)
nRBC: 0 % (ref 0.0–0.2)

## 2021-03-09 LAB — HEPATIC FUNCTION PANEL
ALT: 33 U/L (ref 0–44)
AST: 54 U/L — ABNORMAL HIGH (ref 15–41)
Albumin: 2.9 g/dL — ABNORMAL LOW (ref 3.5–5.0)
Alkaline Phosphatase: 41 U/L (ref 38–126)
Bilirubin, Direct: 0.2 mg/dL (ref 0.0–0.2)
Indirect Bilirubin: 0.4 mg/dL (ref 0.3–0.9)
Total Bilirubin: 0.6 mg/dL (ref 0.3–1.2)
Total Protein: 5.5 g/dL — ABNORMAL LOW (ref 6.5–8.1)

## 2021-03-09 LAB — CK
Total CK: 1204 U/L — ABNORMAL HIGH (ref 49–397)
Total CK: 814 U/L — ABNORMAL HIGH (ref 49–397)

## 2021-03-09 LAB — URINE CULTURE

## 2021-03-09 MED ORDER — BICTEGRAVIR-EMTRICITAB-TENOFOV 50-200-25 MG PO TABS
1.0000 | ORAL_TABLET | Freq: Every day | ORAL | Status: DC
  Administered 2021-03-09 – 2021-03-18 (×10): 1 via ORAL
  Filled 2021-03-09 (×10): qty 1

## 2021-03-09 MED ORDER — MUPIROCIN 2 % EX OINT
TOPICAL_OINTMENT | Freq: Two times a day (BID) | CUTANEOUS | Status: DC
  Administered 2021-03-12 – 2021-03-16 (×2): 1 via TOPICAL
  Filled 2021-03-09: qty 44
  Filled 2021-03-09 (×4): qty 22

## 2021-03-09 NOTE — Hospital Course (Addendum)
[name] Herring is a 60 y.o. male with medical history significant of HIV, HTN, hx of PE/DVT on eliquis and IVC filter placement in 2018, emphysema, depression, personality disorder,  tobacco abuse (1/2 PPD), schizophrenia, alcohol abuse (has not had a drink in 2 months) and gastric ucler he thinks 3 years ago who presents to ER after fall x 3 days ago due to an episode of dizziness.  See full H&P for further details.  Patient's daughter lives in [place] and she came to check on him after not hearing from him in 2 weeks and found bugs everywhere and called EMS, contacted patient's Education officer, [employer].  Patient is daughter reports ports she thought an aunt who lives locally had been checking in on patient.   Recently admitted at behavior health for worsening comman auditory hallucination and suicidal ideations -patient denied these complaints on admission.    Evaluation in the ED consistent with dehydration including acute kidney injury, hypernatremia and rhabdomyolysis.  These are being treated with IV hydration.  TOC, PT, OT are consulted for disposition planning.

## 2021-03-09 NOTE — Plan of Care (Signed)
  Problem: Education: Goal: Knowledge of General Education information will improve Description: Including pain rating scale, medication(s)/side effects and non-pharmacologic comfort measures Outcome: Not Progressing   

## 2021-03-09 NOTE — Consult Note (Signed)
WOC Nurse Consult Note: Patient receiving care in [facility] 5C20 Reason for Consult: Right arm skin abrasion from a fall.  Wound type: Abrasions on the right arm, shoulder and left leg. (See photos) Pressure Injury POA: NA Measurement: Wound bed: Crusted, pink, red Periwound: intact Dressing procedure/placement/frequency: Apply mupirocin ointment to abrasions on the right arm, shoulder and left leg. Cover with non adherent dressing and secure with foam dressing on the shoulder and leg, wrap the arm with Kerlix. Change BID.  Monitor the wound area(s) for worsening of condition such as: Signs/symptoms of infection, increase in size, development of or worsening of odor, development of pain, or increased pain at the affected locations.   Notify the medical team if any of these develop.  Thank you for the consult. [place] nurse will not follow at this time.   Please re-consult the [place] team if needed.  Cathlean Marseilles Tamala Julian, MSN, RN, [place], Lysle Pearl, [facility] Wound Treatment Associate Pager [phone]

## 2021-03-09 NOTE — Progress Notes (Addendum)
PROGRESS NOTE    [name] Herring   LSL:[id number]  DOB: 1961-09-19  PCP: Sonia Side., FNP    DOA: 03/08/2021 LOS: 1   Assessment & Plan   Principal Problem:   Dehydration Active Problems:   Human immunodeficiency virus (HIV) disease ([place] [name])   Hypothyroidism   Depression   Alcohol abuse   HTN (hypertension)   Tobacco abuse   Emphysema lung ([place])   AKI (acute kidney injury) ([place])   History of pulmonary embolism   Schizophrenia ([facility])   Hypernatremia   Rhabdomyolysis   Dehydration / Hypernatremia / Rhabdomyolysis - all POA. Pt reported very little PO intake x 2 weeks prior to admission. Given bolus IV fluids in ED. --continue IV hydration --monitor BMP's --Seroquel and statin on hold with rhabdo --Monitor liver/renal function and electrolytes --Encourage PO intake --Dietician consulted  AKI - due to above, pre-renal azotemia.  Improving. --continue IV hydration  Poor Living Situation - hx provided, pt was living in squalor and not caring for himself or with anyone helping him. --TOC consulted --Daughter hopes to move pt closer to her in [place] --PT and OT consults for dispo planning  Hx of hypertension: normotensive to soft BP with home meds held.  Med hx shows regimen is amlodipine 10 mg daily, clonidine 0.2 mg TID, lisinopril 40 mg daily, Lopressor 25 mg BID. --Hold meds and resume when indicated --Suspect has not been taking --Monitor BP closely, watch for rebound HTN   Hx of HIV - on Biktarvy - initially held due to renal function, resumed 6/15.  Hypothyroidism - Not on medication.  TSH normal at 0.688.  COPD/emphysema -stable, not exacerbated.  Continue home inhaler regimen and as needed albuterol  History of PE/DVT -continue Eliquis  Depression -continue home medication  History of alcohol abuse -patient reported not drinking alcohol in 2 months.  Started on thiamine, multivitamin and folic acid on admission.  No signs of withdrawal so far.  On  renally dosed gabapentin, continue for now.  Monitor closely.  Tobacco abuse -nicotine patch ordered  Thrombocytosis -likely reactive, history of having thrombocytopenia.  Monitor CBC.  Outpatient follow-up if needed.  Macrocytosis -likely due to chronic alcohol consumption.  Follow-up B12 and folate  Schizophrenia - appears clinically stable.  Continue all other home medications except for Seroquel given elevated CK, resume once CK is improved.    Patient BMI: Body mass index is 18.17 kg/m.   DVT prophylaxis:  apixaban (ELIQUIS) tablet 5 mg   Diet:  Diet Orders (From admission, onward)     Start     Ordered   03/08/21 1421  Diet regular Room service appropriate? Yes; Fluid consistency: Thin  Diet effective now       Question Answer Comment  Room service appropriate? Yes   Fluid consistency: Thin      03/08/21 1423              Code Status: Full Code   Brief Narrative / Hospital Course to Date:   [name] Herring is a 60 y.o. male with medical history significant of HIV, HTN, hx of PE/DVT on eliquis and IVC filter placement in 2018, emphysema, depression, personality disorder,  tobacco abuse (1/2 PPD), schizophrenia, alcohol abuse (has not had a drink in 2 months) and gastric ucler he thinks 3 years ago who presents to ER after fall x 3 days ago due to an episode of dizziness.  See full H&P for further details.  Patient's daughter lives in [place]  and she came to check on him after not hearing from him in 2 weeks and found bugs everywhere and called EMS, contacted patient's Education officer, museum.  Patient is daughter reports ports she thought an aunt who lives locally had been checking in on patient.   Recently admitted at [facility] for worsening comman auditory hallucination and suicidal ideations -patient denied these complaints on admission.    Evaluation in the ED consistent with dehydration including acute kidney injury, hypernatremia and rhabdomyolysis.  These are  being treated with IV hydration.  TOC, PT, OT are consulted for disposition planning.    Subjective 03/09/21    Patient was sleeping comfortably when seen today.  He wakes briefly to voice.  He denies having any pain, fevers or chills, or other complaints.  No acute events reported.  No reports of significant withdrawal symptoms.   Disposition Plan & Communication   Status is: Inpatient  Remains inpatient appropriate because:IV treatments appropriate due to intensity of illness or inability to take PO  Dispo: The patient is from: Home              Anticipated d/c is to: SNF              Patient currently is not medically stable to d/c.   Difficult to place patient TBD   Family Communication: None present, will attempt to call daughter   Consults, Procedures, Significant Events   Consultants:  None  Procedures:  None  Antimicrobials:  Anti-infectives (From admission, onward)    Start     Dose/Rate Route Frequency Ordered Stop   03/09/21 1200  bictegravir-emtricitabine-tenofovir AF (BIKTARVY) 50-200-25 MG per tablet 1 tablet        1 tablet Oral Daily 03/09/21 1057           Micro    Objective   Vitals:   03/08/21 2220 03/09/21 0351 03/09/21 1237 03/09/21 1805  BP: (!) 154/96 113/79 104/71 121/76  Pulse: 61 (!) 59 (!) 57 65  Resp: 18 20 16 16   Temp: 97.7 F (36.5 C) 97.9 F (36.6 C) 98.2 F (36.8 C) 97.6 F (36.4 C)  TempSrc: Oral Axillary Oral Oral  SpO2: 100% 97% 99% 98%  Weight: 54.2 kg     Height: 5\' 8"  (1.727 m)       Intake/Output Summary (Last 24 hours) at 03/09/2021 1850 Last data filed at 03/09/2021 0900 Gross per 24 hour  Intake 2245 ml  Output --  Net 2245 ml   Filed Weights   03/08/21 1038 03/08/21 2220  Weight: 58 kg 54.2 kg    Physical Exam:  General exam: Sleeping comfortably, no acute distress, cachectic Respiratory system: CTAB, no wheezes, rales or rhonchi, normal respiratory effort. Cardiovascular system: normal S1/S2,  RRR, no JVD, murmurs, rubs, gallops, no pedal edema.   Gastrointestinal system: soft, NT, ND, no HSM felt, +bowel sounds. Central nervous system: no gross focal neurologic deficits, normal speech Skin: dry, intact, normal temperature, normal color Psychiatry: normal mood, congruent affect, no obvious signs of auditory or visual hallucinations but exam limited by patient only waking up briefly, not very interactive  Labs   Data Reviewed: I have personally reviewed following labs and imaging studies  CBC: Recent Labs  Lab 03/08/21 1050 03/08/21 1100 03/09/21 0559  WBC 6.8  --  6.1  NEUTROABS 5.2  --   --   HGB 15.5 15.3 12.7*  HCT 48.4 45.0 38.6*  MCV 106.6*  --  104.3*  PLT 126*  --  PLATELET CLUMPS NOTED ON SMEAR, UNABLE TO ESTIMATE   Basic Metabolic Panel: Recent Labs  Lab 03/08/21 1050 03/08/21 1100 03/08/21 1651 03/08/21 2322 03/09/21 0559  NA 154* 158* 152* 147* 146*  K 4.1 4.0 4.3 3.9 3.9  CL 121* 122* 115* 113* 115*  CO2 22  --  25 27 26   GLUCOSE 141* 144* 109* 124* 97  BUN 59* 54* 56* 47* 40*  CREATININE 2.74* 2.90* 2.17* 1.91* 1.55*  CALCIUM 10.2  --  10.2 9.6 9.3  MG  --   --  2.9*  --   --    GFR: Estimated Creatinine Clearance: 39.3 mL/min (A) (by C-G formula based on SCr of 1.55 mg/dL (H)). Liver Function Tests: Recent Labs  Lab 03/08/21 1050 03/08/21 1651 03/09/21 0559 03/09/21 1635  AST 80* 83* 63* 54*  ALT 32 37 33 33  ALKPHOS 53 49 41 41  BILITOT 0.8 0.9 0.6 0.6  PROT 7.2 7.3 5.7* 5.5*  ALBUMIN 4.0 4.0 3.0* 2.9*   No results for input(s): LIPASE, AMYLASE in the last 168 hours. No results for input(s): AMMONIA in the last 168 hours. Coagulation Profile: Recent Labs  Lab 03/08/21 1050  INR 1.2   Cardiac Enzymes: Recent Labs  Lab 03/08/21 1050 03/08/21 1651 03/09/21 0559 03/09/21 1635  CKTOTAL 2,185* 1,979* 1,204* 814*   BNP (last 3 results) No results for input(s): PROBNP in the last 8760 hours. HbA1C: No results for input(s):  HGBA1C in the last 72 hours. CBG: No results for input(s): GLUCAP in the last 168 hours. Lipid Profile: No results for input(s): CHOL, HDL, LDLCALC, TRIG, CHOLHDL, LDLDIRECT in the last 72 hours. Thyroid Function Tests: Recent Labs    03/08/21 1421  TSH 0.688   Anemia Panel: Recent Labs    03/08/21 1421 03/08/21 1649  VITAMINB12 1,777*  --   FOLATE  --  33.8   Sepsis Labs: Recent Labs  Lab 03/08/21 1050  LATICACIDVEN 1.6    Recent Results (from the past 240 hour(s))  Urine culture     Status: Abnormal   Collection Time: 03/08/21 10:40 AM   Specimen: In/Out Cath Urine  Result Value Ref Range Status   Specimen Description IN/OUT CATH URINE  Final   Special Requests   Final    NONE Performed at [facility], [place] [address] [place], [place] [address]    Culture MULTIPLE SPECIES PRESENT, SUGGEST RECOLLECTION (A)  Final   Report Status 03/09/2021 FINAL  Final  Blood Culture (routine x 2)     Status: None (Preliminary result)   Collection Time: 03/08/21 10:50 AM   Specimen: BLOOD LEFT FOREARM  Result Value Ref Range Status   Specimen Description BLOOD LEFT FOREARM  Final   Special Requests   Final    BOTTLES DRAWN AEROBIC AND ANAEROBIC Blood Culture adequate volume   Culture   Final    NO GROWTH < 24 HOURS Performed at [facility], [place] [address], [place], [place] [address]    Report Status PENDING  Incomplete  Blood Culture (routine x 2)     Status: None (Preliminary result)   Collection Time: 03/08/21 10:51 AM   Specimen: BLOOD  Result Value Ref Range Status   Specimen Description BLOOD SITE NOT SPECIFIED  Final   Special Requests   Final    BOTTLES DRAWN AEROBIC AND ANAEROBIC Blood Culture adequate volume   Culture   Final    NO GROWTH < 24 HOURS Performed at [facility], [place] [address], [place], [place] [address]    Report Status PENDING  Incomplete  Resp Panel by RT-PCR (Flu A&B, Covid) Nasopharyngeal Swab     Status: None   Collection  Time: 03/08/21 12:13 PM   Specimen: Nasopharyngeal Swab; Nasopharyngeal(NP) swabs in vial transport medium  Result Value Ref Range Status   SARS Coronavirus 2 by RT PCR NEGATIVE NEGATIVE Final    Comment: (NOTE) SARS-CoV-2 target nucleic acids are NOT DETECTED.  The SARS-CoV-2 RNA is generally detectable in upper respiratory specimens during the acute phase of infection. The lowest concentration of SARS-CoV-2 viral copies this assay can detect is 138 copies/mL. A negative result does not preclude SARS-Cov-2 infection and should not be used as the sole basis for treatment or other patient management decisions. A negative result may occur with  improper specimen collection/handling, submission of specimen other than nasopharyngeal swab, presence of viral mutation(s) within the areas targeted by this assay, and inadequate number of viral copies(<138 copies/mL). A negative result must be combined with clinical observations, patient history, and epidemiological information. The expected result is Negative.  Fact Sheet for Patients:  EntrepreneurPulse.com.au  Fact Sheet for Healthcare Providers:  IncredibleEmployment.be  This test is no t yet approved or cleared by the [place] FDA and  has been authorized for detection and/or diagnosis of SARS-CoV-2 by FDA under an Emergency Use Authorization (EUA). This EUA will remain  in effect (meaning this test can be used) for the duration of the COVID-19 declaration under Section 564(b)(1) of the Act, 21 U.S.C.section 360bbb-3(b)(1), unless the authorization is terminated  or revoked sooner.       Influenza A by PCR NEGATIVE NEGATIVE Final   Influenza B by PCR NEGATIVE NEGATIVE Final    Comment: (NOTE) The Xpert Xpress SARS-CoV-2/FLU/RSV plus assay is intended as an aid in the diagnosis of influenza from Nasopharyngeal swab specimens and should not be used as a sole basis for treatment. Nasal washings  and aspirates are unacceptable for Xpert Xpress SARS-CoV-2/FLU/RSV testing.  Fact Sheet for Patients: EntrepreneurPulse.com.au  Fact Sheet for Healthcare Providers: IncredibleEmployment.be  This test is not yet approved or cleared by the [place] FDA and has been authorized for detection and/or diagnosis of SARS-CoV-2 by FDA under an Emergency Use Authorization (EUA). This EUA will remain in effect (meaning this test can be used) for the duration of the COVID-19 declaration under Section 564(b)(1) of the Act, 21 U.S.C. section 360bbb-3(b)(1), unless the authorization is terminated or revoked.  Performed at [facility], [place] [address], [place],  [address]       Imaging Studies   CT Head Wo Contrast  Result Date: 03/08/2021 CLINICAL DATA:  Head trauma failure to thrive EXAM: CT HEAD WITHOUT CONTRAST CT CERVICAL SPINE WITHOUT CONTRAST TECHNIQUE: Multidetector CT imaging of the head and cervical spine was performed following the standard protocol without intravenous contrast. Multiplanar CT image reconstructions of the cervical spine were also generated. COMPARISON:  CT head 10/29/2020 FINDINGS: CT HEAD FINDINGS Brain: Generalized atrophy without hydrocephalus. Mild white matter hypodensity bilaterally, stable from prior. Negative for acute infarct, hemorrhage, mass Vascular: Negative for hyperdense vessel Skull: Negative Sinuses/Orbits: Cyst left maxillary sinus.  Negative orbit Other: None CT CERVICAL SPINE FINDINGS Alignment: Slight retrolisthesis at C4-5. Straightening of the cervical lordosis Skull base and vertebrae: Negative for fracture Soft tissues and spinal canal: Negative Disc levels: Mild disc degeneration and spurring most prominent at C4-5. Upper chest: Severe apical emphysema without acute abnormality. Other:  None IMPRESSION: 1. No acute intracranial abnormality. Mild atrophy and mild chronic microvascular ischemia 2.  Negative for cervical spine fracture 3. Severe emphysema Electronically Signed   By: Franchot Gallo M.D.   On: 03/08/2021 13:19   CT Cervical Spine Wo Contrast  Result Date: 03/08/2021 CLINICAL DATA:  Head trauma failure to thrive EXAM: CT HEAD WITHOUT CONTRAST CT CERVICAL SPINE WITHOUT CONTRAST TECHNIQUE: Multidetector CT imaging of the head and cervical spine was performed following the standard protocol without intravenous contrast. Multiplanar CT image reconstructions of the cervical spine were also generated. COMPARISON:  CT head 10/29/2020 FINDINGS: CT HEAD FINDINGS Brain: Generalized atrophy without hydrocephalus. Mild white matter hypodensity bilaterally, stable from prior. Negative for acute infarct, hemorrhage, mass Vascular: Negative for hyperdense vessel Skull: Negative Sinuses/Orbits: Cyst left maxillary sinus.  Negative orbit Other: None CT CERVICAL SPINE FINDINGS Alignment: Slight retrolisthesis at C4-5. Straightening of the cervical lordosis Skull base and vertebrae: Negative for fracture Soft tissues and spinal canal: Negative Disc levels: Mild disc degeneration and spurring most prominent at C4-5. Upper chest: Severe apical emphysema without acute abnormality. Other: None IMPRESSION: 1. No acute intracranial abnormality. Mild atrophy and mild chronic microvascular ischemia 2. Negative for cervical spine fracture 3. Severe emphysema Electronically Signed   By: Franchot Gallo M.D.   On: 03/08/2021 13:19   DG Chest Port 1 View  Result Date: 03/08/2021 CLINICAL DATA:  Cough for 2 days EXAM: PORTABLE CHEST 1 VIEW COMPARISON:  12/02/2020 FINDINGS: Cardiac shadow is stable. Lungs are well aerated bilaterally. No focal infiltrate or sizable effusion is seen. No bony abnormality is noted. IMPRESSION: No acute abnormality seen. Electronically Signed   By: Inez Catalina M.D.   On: 03/08/2021 11:15     Medications   Scheduled Meds:  apixaban  5 mg Oral BID   benztropine  1 mg Oral BID    bictegravir-emtricitabine-tenofovir AF  1 tablet Oral Daily   folic acid  1 mg Oral Daily   gabapentin  600 mg Oral QHS   mirtazapine  15 mg Oral QHS   mometasone-formoterol  2 puff Inhalation BID   montelukast  10 mg Oral Daily   multivitamin with minerals  1 tablet Oral Daily   mupirocin ointment   Topical BID   nicotine  14 mg Transdermal Daily   risperiDONE  2 mg Oral BID   thiamine  100 mg Oral Daily   valbenazine  80 mg Oral QHS   Continuous Infusions:  lactated ringers 150 mL/hr at 03/09/21 1615       LOS: 1 day    Time spent: 25 minutes    Ezekiel Slocumb, DO [facility]  03/09/2021, 6:50 PM      If 7PM-7AM, please contact night-coverage. How to contact the [facility] Attending or Consulting provider [place] or covering provider during after hours [place], for this patient?    Check the care team in [facility] and look for a) attending/consulting [facility] provider listed and b) the [facility] team listed Log into [email or web address] and use [place]'s universal password to access. If you do not have the password, please contact the hospital operator. Locate the [facility] provider you are looking for under [facility] and page to a number that you can be directly reached. If you still have difficulty reaching the provider, please page the [facility] (Director on Call) for the Hospitalists listed on amion for assistance.

## 2021-03-10 DIAGNOSIS — E43 Unspecified severe protein-calorie malnutrition: Secondary | ICD-10-CM | POA: Insufficient documentation

## 2021-03-10 LAB — COMPREHENSIVE METABOLIC PANEL
ALT: 29 U/L (ref 0–44)
AST: 43 U/L — ABNORMAL HIGH (ref 15–41)
Albumin: 2.6 g/dL — ABNORMAL LOW (ref 3.5–5.0)
Alkaline Phosphatase: 43 U/L (ref 38–126)
Anion gap: 5 (ref 5–15)
BUN: 27 mg/dL — ABNORMAL HIGH (ref 6–20)
CO2: 26 mmol/L (ref 22–32)
Calcium: 8.9 mg/dL (ref 8.9–10.3)
Chloride: 114 mmol/L — ABNORMAL HIGH (ref 98–111)
Creatinine, Ser: 1.33 mg/dL — ABNORMAL HIGH (ref 0.61–1.24)
GFR, Estimated: >60 mL/min (ref >60)
Glucose, Bld: 88 mg/dL (ref 70–99)
Potassium: 3.6 mmol/L (ref 3.5–5.1)
Sodium: 145 mmol/L (ref 135–145)
Total Bilirubin: 0.3 mg/dL (ref 0.3–1.2)
Total Protein: 5 g/dL — ABNORMAL LOW (ref 6.5–8.1)

## 2021-03-10 LAB — CBC
HCT: 36.5 % — ABNORMAL LOW (ref 39.0–52.0)
Hemoglobin: 12.1 g/dL — ABNORMAL LOW (ref 13.0–17.0)
MCH: 34.1 pg — ABNORMAL HIGH (ref 26.0–34.0)
MCHC: 33.2 g/dL (ref 30.0–36.0)
MCV: 102.8 fL — ABNORMAL HIGH (ref 80.0–100.0)
Platelets: 81 10*3/uL — ABNORMAL LOW (ref 150–400)
RBC: 3.55 MIL/uL — ABNORMAL LOW (ref 4.22–5.81)
RDW: 12.5 % (ref 11.5–15.5)
WBC: 4.7 10*3/uL (ref 4.0–10.5)
nRBC: 0 % (ref 0.0–0.2)

## 2021-03-10 LAB — CK: Total CK: 533 U/L — ABNORMAL HIGH (ref 49–397)

## 2021-03-10 MED ORDER — LIDOCAINE VISCOUS HCL 2 % MT SOLN
15.0000 mL | Freq: Once | OROMUCOSAL | Status: AC
  Administered 2021-03-10: 15 mL via ORAL
  Filled 2021-03-10: qty 15

## 2021-03-10 MED ORDER — FAMOTIDINE 20 MG PO TABS
20.0000 mg | ORAL_TABLET | Freq: Every day | ORAL | Status: DC
  Administered 2021-03-10 – 2021-03-12 (×3): 20 mg via ORAL
  Filled 2021-03-10 (×3): qty 1

## 2021-03-10 MED ORDER — ALUM & MAG HYDROXIDE-SIMETH 200-200-20 MG/5ML PO SUSP
30.0000 mL | Freq: Once | ORAL | Status: AC
  Administered 2021-03-10: 30 mL via ORAL
  Filled 2021-03-10: qty 30

## 2021-03-10 MED ORDER — ENSURE ENLIVE PO LIQD
237.0000 mL | Freq: Three times a day (TID) | ORAL | Status: DC
  Administered 2021-03-10 – 2021-03-18 (×23): 237 mL via ORAL
  Filled 2021-03-10 (×4): qty 237

## 2021-03-10 NOTE — Progress Notes (Signed)
Initial Nutrition Assessment  DOCUMENTATION CODES:  Severe malnutrition in context of chronic illness, Severe malnutrition in context of social or environmental circumstances, Underweight  INTERVENTION:  Continue regular diet order.  Add Ensure Enlive po TID, each supplement provides 350 kcal and 20 grams of protein.  Add Magic cup TID with meals, each supplement provides 290 kcal and 9 grams of protein.  Continue CIWA.  NUTRITION DIAGNOSIS:  Severe Malnutrition related to chronic illness, social / environmental circumstances as evidenced by percent weight loss, severe fat depletion, severe muscle depletion.  GOAL:  Patient will meet greater than or equal to 90% of their needs  MONITOR:  PO intake, Supplement acceptance, Labs, Weight trends, Skin, I & O's  REASON FOR ASSESSMENT:  Consult Assessment of nutrition requirement/status  ASSESSMENT:  60 yo male with a PMH of HIV, hypothyroidism, depression, EtOH and tobacco dependence, emphysema, personality disorder, and schizophrenia who presents with dehydration, rhamdomyolosis, AKI, and hypernatremia from home living in [place].  Of note, UDS positive for cocaine.  Spoke with pt at bedside. Pt reports eating 3 meals per day with snacks. He reports eating bacon, eggs, and sausage for breakfast, sandwiches for lunch, and meats/greens/starches for dinner. He snacks on cookies throughout the day. Per Epic, pt eating 75-100% of meals.  Pt denies any changes in his weight. Per Epic, pt has lost ~12 lbs (9.3%) in the past 3.5 months, which is significant and severe for the time frame. Pt's weight appears to be copied and pasted from 12/01/19 to 02/19/20.  On exam, pt severely depleted in all areas.  Given above information, pt is severely malnourished in the chronic setting likely previously moderately malnourished.  Recommend adding Ensure Enlive TID and Magic Cup TID, as well as continuing CIWA protocol.  Medications: reviewed;  Biktarvy, Pepcid, folic acid, Remeron, MVI with minerals, Risperdal, thiamine, LR @ 75 ml/hr  Labs: reviewed  NUTRITION - FOCUSED PHYSICAL EXAM: Flowsheet Row Most Recent Value  Orbital Region Severe depletion  Upper Arm Region Severe depletion  Thoracic and Lumbar Region Severe depletion  Buccal Region Severe depletion  Temple Region Severe depletion  Clavicle Bone Region Severe depletion  Clavicle and Acromion Bone Region Severe depletion  Scapular Bone Region Severe depletion  Dorsal Hand Severe depletion  Patellar Region Severe depletion  Anterior Thigh Region Severe depletion  Posterior Calf Region Severe depletion  Edema (RD Assessment) None  Hair Reviewed  Eyes Reviewed  Mouth Reviewed  Skin Reviewed  Nails Reviewed   Diet Order:   Diet Order             Diet regular Room service appropriate? Yes; Fluid consistency: Thin  Diet effective now                  EDUCATION NEEDS:  Education needs have been addressed  Skin:  Skin Assessment: Skin Integrity Issues: Skin Integrity Issues:: Other (Comment) Other: Open abrasions/wounds  Last BM:  03/08/21 - Type 5, large; incontinent of bowels  Height:  Ht Readings from Last 1 Encounters:  03/08/21 5\' 8"  (1.727 m)   Weight:  Wt Readings from Last 1 Encounters:  03/08/21 54.2 kg   Ideal Body Weight:  70 kg  BMI:  Body mass index is 18.17 kg/m.  Estimated Nutritional Needs:  Kcal:  2200-2400 Protein:  85-100 grams Fluid:  >2 L  Derrel Nip, RD, LDN Registered Dietitian I After-Hours/Weekend Pager # in [place]

## 2021-03-10 NOTE — Evaluation (Signed)
Occupational Therapy Evaluation Patient Details Name: [name] Herring MRN: [id number] DOB: [birth year] Today's Date: 03/10/2021    History of Present Illness Pt is a 60 year old man admitted 03/09/21 from home where he was living in squalor. + dehydration, rhamdomyolosis, AKI, hypernatremia. PMH: HIV, hypothyroidism, depression, alcohol and tobacco dependence, emphysema, personality disorder, schizophrenia.   Clinical Impression   Pt was living alone, ambulating with a RW. He used a taxi for transportation. Pt presents with generalized weakness and nausea. He required mod assist for bed mobility and demonstrated fair sitting balance as he participated in grooming and UB dressing at EOB. Pt declined OOB to chair. Recommending SNF and pt is agreeable. Will follow acutely.    Follow Up Recommendations  SNF;Supervision/Assistance - 24 hour    Equipment Recommendations  3 in 1 bedside commode    Recommendations for Other Services       Precautions / Restrictions Precautions Precautions: Fall      Mobility Bed Mobility Overal bed mobility: Needs Assistance Bed Mobility: Supine to Sit     Supine to sit: Mod assist     General bed mobility comments: assist to raise trunk and for LEs back into bed    Transfers                 General transfer comment: pt declined due to nausea    Balance Overall balance assessment: Needs assistance   Sitting balance-Leahy Scale: Fair                                     ADL either performed or assessed with clinical judgement   ADL Overall ADL's : Needs assistance/impaired Eating/Feeding: Set up;Bed level   Grooming: Wash/dry hands;Wash/dry face;Sitting;Set up   Upper Body Bathing: Minimal assistance;Sitting   Lower Body Bathing: Maximal assistance;Sitting/lateral leans   Upper Body Dressing : Minimal assistance;Sitting   Lower Body Dressing: Maximal assistance;Sitting/lateral leans                  General ADL Comments: pt with nausea, limited to sitting EOB for grooming and to change gown     Vision Baseline Vision/History: Wears glasses Wears Glasses: Reading only Patient Visual Report: No change from baseline       Perception     Praxis      Pertinent Vitals/Pain Pain Assessment: 0-10 Pain Location: chest Pain Descriptors / Indicators: Discomfort Pain Intervention(s): Repositioned;Monitored during session;Other (comment) (RN aware)     Hand Dominance Right   Extremity/Trunk Assessment Upper Extremity Assessment Upper Extremity Assessment: Generalized weakness   Lower Extremity Assessment Lower Extremity Assessment: Defer to PT evaluation       Communication Communication Communication: No difficulties   Cognition Arousal/Alertness: Awake/alert Behavior During Therapy: Flat affect Overall Cognitive Status: Impaired/Different from baseline Area of Impairment: Problem solving                             Problem Solving: Decreased initiation General Comments: pt's daughter had been calling the room, phone was not where pt could reach, but he did not initiate using call buttong to ask for help   General Comments       Exercises     Shoulder Instructions      Home Living Family/patient expects to be discharged to:: Private residence Living Arrangements: Alone   Type of Home: Apartment Home Access:  Stairs to enter [employer] of Steps: 2 flights Entrance Stairs-Rails: Right;Left Home Layout: One level     Bathroom Shower/Tub: Teacher, early years/pre: Standard     Home Equipment: Environmental consultant - 2 wheels          Prior Functioning/Environment Level of Independence: Independent with assistive device(s)        Comments: walks with a RW, takes a taxi to get groceries or to appointments, daughter Holland Commons lives in [place]        [place] Problem List: Decreased strength      OT Treatment/Interventions:  Self-care/ADL training;DME and/or AE instruction;Patient/family education;Balance training;Therapeutic activities    OT Goals(Current goals can be found in the care plan section) Acute Rehab OT Goals Patient Stated Goal: to feel better OT Goal Formulation: With patient Time For Goal Achievement: 03/24/21 Potential to Achieve Goals: Good ADL Goals Pt Will Perform Grooming: with min guard assist;standing Pt Will Perform Lower Body Bathing: with min guard assist;sit to/from stand Pt Will Perform Lower Body Dressing: with min guard assist;sit to/from stand Pt Will Transfer to Toilet: with min guard assist;ambulating;bedside commode Pt Will Perform Toileting - Clothing Manipulation and hygiene: with min guard assist;sit to/from stand  OT Frequency: Min 2X/week   Barriers to D/C: Decreased caregiver support          Co-evaluation              AM-PAC OT "6 Clicks" Daily Activity     Outcome Measure Help from another person eating meals?: A Little Help from another person taking care of personal grooming?: A Little Help from another person toileting, which includes using toliet, bedpan, or urinal?: A Lot Help from another person bathing (including washing, rinsing, drying)?: A Lot Help from another person to put on and taking off regular upper body clothing?: A Little Help from another person to put on and taking off regular lower body clothing?: A Lot 6 Click Score: 15   End of Session    Activity Tolerance: Treatment limited secondary to medical complications (Comment) (nausea) Patient left: in bed;with call bell/phone within reach;with bed alarm set  OT Visit Diagnosis: Muscle weakness (generalized) (M62.81)                Time: 2883-3744 OT Time Calculation (min): 16 min Charges:  OT General Charges $OT Visit: 1 Visit OT Evaluation $OT Eval Moderate Complexity: 1 Mod  Nestor Lewandowsky, OTR/L Acute Rehabilitation Services Pager: [phone] Office: [phone]    Malka So 03/10/2021, 10:07 AM

## 2021-03-10 NOTE — Progress Notes (Signed)
Patient complained of left chest burning and stabbing pain. Vitals stable, NAD. MD notified. Ordered EKG and GI cocktail. Orders carried out as instructed. Patient made comfortable in bed. Will continue to monitor .

## 2021-03-10 NOTE — TOC Initial Note (Signed)
Transition of Care [facility]) - Initial/Assessment Note    Patient Details  Name: [name] Herring MRN: [id number] Date of Birth: 1960-11-25  [facility] of [facility]) CM/SW Contact:    Emeterio Reeve, [place] Phone Number: 03/10/2021, 3:29 PM  Clinical Narrative:                  CSW spoke to pts daughter [name] Herring on phone. Pt is oriented x2 at thie time. [name] stated that pt lives at home lone. [name] reports that the pts home is infested with roaches and is unsafe for him to live in. [name] reports pt was mobile PTA but moved slow. Pt was able to bath and dress self independently.   CSW reviewed PT/OT reccs of SNF. [name] is in agreement tht pt needs SNF. [name] states she hopes to move pt closer to her . [name] reports pt has covid 19 vaccine and unsure about booster. [name] gave CSW permission to fax out to SNFs in the area.   [name] reports the pt has an open DSS case but does not know who his Education officer, museum is.   Expected Discharge Plan: Skilled Nursing Facility Barriers to Discharge: Continued Medical Work up   Patient Goals and CMS Choice Patient states their goals for this hospitalization and ongoing recovery are:: to get stronger CMS Medicare.gov Compare Post Acute Care list provided to:: Patient Choice offered to / list presented to : Patient  Expected Discharge Plan and Services Expected Discharge Plan: [place] arrangements for the past 2 months: Single Family Home                                      Prior Living Arrangements/Services Living arrangements for the past 2 months: Single Family Home Lives with:: Self Patient language and need for interpreter reviewed:: Yes Do you feel safe going back to the place where you live?: No   There are rooaches infested in the home, unsafe  Need for Family Participation in Patient Care: Yes (Comment) Care giver support system in place?: No (comment)   Criminal Activity/Legal Involvement  Pertinent to Current Situation/Hospitalization: No - Comment as needed  Activities of Daily Living Home Assistive Devices/Equipment: Other (Comment) (pt unsure) ADL Screening (condition at time of admission) Patient's cognitive ability adequate to safely complete daily activities?: No Is the patient deaf or have difficulty hearing?: No Does the patient have difficulty seeing, even when wearing glasses/contacts?: No Does the patient have difficulty concentrating, remembering, or making decisions?: Yes Patient able to express need for assistance with ADLs?: No Does the patient have difficulty dressing or bathing?: Yes Independently performs ADLs?: No Does the patient have difficulty walking or climbing stairs?: Yes Weakness of Legs: Both Weakness of Arms/Hands: Both  Permission Sought/Granted   Permission granted to share information with : Yes, Verbal Permission Granted     Permission granted to share info w AGENCY: SNF        Emotional Assessment Appearance:: Appears older than stated age Attitude/Demeanor/Rapport: Other (comment) Affect (typically observed): Other (comment) Orientation: : Oriented to Self, Oriented to Place Alcohol / Substance Use: Not Applicable Psych Involvement: No (comment)  Admission diagnosis:  Dehydration [E86.0] Hypernatremia [E87.0] Elevated CK [R74.8] Failure to thrive in adult [R62.7] AKI (acute kidney injury) ([place]) [N17.9] Patient Active Problem List   Diagnosis Date Noted   Hypernatremia 03/08/2021   Dehydration  03/08/2021   Rhabdomyolysis 03/08/2021   Schizophrenia ([place]) 02/18/2021   AKI (acute kidney injury) ([place]) 01/15/2020   History of pulmonary embolism 01/15/2020   Genital warts 10/16/2019   GERD with esophagitis 01/22/2019   Protein C deficiency ([place]) 07/05/2017   Benign neoplasm of transverse colon    Emphysema lung ([place]) 11/20/2016   DVT of lower extremity, bilateral ([place]) 11/04/2016   Tobacco abuse 11/02/2016   HTN  (hypertension) 08/25/2015   Alcohol use disorder, severe, in early remission ([place]) 12/27/2014   Alcohol abuse 09/26/2014   Human immunodeficiency virus (HIV) disease ([place]) 11/09/2006   CA IN SITU, RECTUM 11/09/2006   Hypothyroidism 11/09/2006   Depression 11/09/2006   PCP:  Sonia Side., FNP Pharmacy:   [facility], [address] [place] [address] Phone: [phone] Fax: [phone]     Social Determinants of Health (SDOH) Interventions    Readmission Risk Interventions No flowsheet data found.  Emeterio Reeve, Latanya Presser, [place] Social Worker [phone]

## 2021-03-10 NOTE — Progress Notes (Signed)
PROGRESS NOTE    [name] Herring   OMV:[id number]  DOB: 12-18-1960  PCP: Sonia Side., FNP    DOA: 03/08/2021 LOS: 2   Assessment & Plan   Principal Problem:   Dehydration Active Problems:   Human immunodeficiency virus (HIV) disease ([place])   Hypothyroidism   Depression   Alcohol abuse   HTN (hypertension)   Tobacco abuse   Emphysema lung (HCC)   AKI (acute kidney injury) ([place])   History of pulmonary embolism   Schizophrenia (HCC)   Hypernatremia   Rhabdomyolysis   Protein-calorie malnutrition, severe   Dehydration / Hypernatremia / Rhabdomyolysis - all POA. Pt reported very little PO intake x 2 weeks prior to admission. Given bolus IV fluids in ED. CK trending down; renal function improving --continue IV hydration --monitor BMP's --Seroquel and statin on hold with rhabdo --Monitor liver/renal function and electrolytes --Encourage PO intake --Dietician consulted  AKI - due to above, pre-renal azotemia.  Improving. --continue IV hydration  Epigastric pain / ?Hx of gastric ulcer Chest/epigastric pain AM of 6/16 relieved by GI cocktail. No EKG changes to suggest acute ischemia. --Pepcid qHS  Severe protein calorie malnutrition - with temporal waisting, Body mass index is 18.17 kg/m.  Albumin 2.6.  Due to inadequate caloric intake and possibly HIV. --Dietician following, appreciate recs  Poor Living Situation - hx provided, pt was living in squalor and not caring for himself or with anyone helping him. --TOC consulted --Daughter hopes to move pt closer to her in [place]  General debility / Generalized Weakness / Ambulator Dysfunction --PT and OT recommending SNF --TOC following for placement  Hx of hypertension: normotensive to soft BP with home meds held.  Med hx shows regimen is amlodipine 10 mg daily, clonidine 0.2 mg TID, lisinopril 40 mg daily, Lopressor 25 mg BID. --Hold meds and resume when indicated --Suspect has not been taking --Monitor BP  closely, watch for rebound HTN   Hx of HIV - on Biktarvy - initially held due to renal function, resumed 6/15.  Hypothyroidism - Not on medication.  TSH normal at 0.688.  COPD/emphysema -stable, not exacerbated.  Continue home inhaler regimen and as needed albuterol  History of PE/DVT -continue Eliquis  Depression -continue home medication  History of alcohol abuse -patient reported not drinking alcohol in 2 months.  Started on thiamine, multivitamin and folic acid on admission.  No signs of withdrawal so far.  On renally dosed gabapentin, continue for now.  Monitor closely.  Tobacco abuse -nicotine patch ordered  Thrombocytosis -likely reactive, history of having thrombocytopenia.  Monitor CBC.  Outpatient follow-up if needed.  Macrocytosis -likely due to chronic alcohol consumption.  Follow-up B12 and folate  Schizophrenia - appears clinically stable.  Continue all other home medications except for Seroquel given elevated CK, resume once CK is improved.    Patient BMI: Body mass index is 18.17 kg/m.   DVT prophylaxis:  apixaban (ELIQUIS) tablet 5 mg   Diet:  Diet Orders (From admission, onward)     Start     Ordered   03/08/21 1421  Diet regular Room service appropriate? Yes; Fluid consistency: Thin  Diet effective now       Question Answer Comment  Room service appropriate? Yes   Fluid consistency: Thin      03/08/21 1423              Code Status: Full Code   Brief Narrative / Hospital Course to Date:   [name] Herring is  a 60 y.o. male with medical history significant of HIV, HTN, hx of PE/DVT on eliquis and IVC filter placement in 2018, emphysema, depression, personality disorder,  tobacco abuse (1/2 PPD), schizophrenia, alcohol abuse (has not had a drink in 2 months) and gastric ucler he thinks 3 years ago who presents to ER after fall x 3 days ago due to an episode of dizziness.  See full H&P for further details.  Patient's daughter lives in [place] and  she came to check on him after not hearing from him in 2 weeks and found bugs everywhere and called EMS, contacted patient's Education officer, museum.  Patient is daughter reports ports she thought an aunt who lives locally had been checking in on patient.   Recently admitted at behavior health for worsening comman auditory hallucination and suicidal ideations -patient denied these complaints on admission.    Evaluation in the ED consistent with dehydration including acute kidney injury, hypernatremia and rhabdomyolysis.  These are being treated with IV hydration.  TOC, PT, OT are consulted for disposition planning.    Subjective 03/10/21    Patient was sitting up in bed watching TV when seen today.  Reports he's feeling better.  Had episode of burning chest pain early AM that was relived by GI cocktail.  Says he gets this once in awhile at baseline.  Says has a stomach ulcer.  Reports good appetite, lunch tray mostly eaten.  No acute complaints.    Disposition Plan & Communication   Status is: Inpatient  Remains inpatient appropriate because:IV treatments appropriate due to intensity of illness or inability to take PO  Dispo: The patient is from: Home              Anticipated d/c is to: SNF              Patient currently is not medically stable to d/c.   Difficult to place patient TBD   Family Communication: None present, will attempt to call daughter   Consults, Procedures, Significant Events   Consultants:  None  Procedures:  None  Antimicrobials:  Anti-infectives (From admission, onward)    Start     Dose/Rate Route Frequency Ordered Stop   03/09/21 1200  bictegravir-emtricitabine-tenofovir AF (BIKTARVY) 50-200-25 MG per tablet 1 tablet        1 tablet Oral Daily 03/09/21 1057           Micro    Objective   Vitals:   03/10/21 0430 03/10/21 0939 03/10/21 1037 03/10/21 1747  BP: 130/89  (!) 129/95 (!) 137/98  Pulse: (!) 56 69 69 74  Resp: 16 16 17 18   Temp: 97.8 F  (36.6 C)  98.4 F (36.9 C) 97.8 F (36.6 C)  TempSrc: Oral  Oral Oral  SpO2: 100% 94% 97% 100%  Weight:      Height:        Intake/Output Summary (Last 24 hours) at 03/10/2021 1800 Last data filed at 03/10/2021 1750 Gross per 24 hour  Intake 5025.97 ml  Output 700 ml  Net 4325.97 ml   Filed Weights   03/08/21 1038 03/08/21 2220  Weight: 58 kg 54.2 kg    Physical Exam:  General exam: awake sitting up in bed, no acute distress, cachectic Respiratory system: CTAB, no wheezes, rales or rhonchi, normal respiratory effort. Cardiovascular system: normal S1/S2, RRR, no pedal edema.   Gastrointestinal system: soft, NT, ND Central nervous system: alert, no gross focal neurologic deficits, normal speech Psychiatry: normal mood, congruent affect  Labs   Data Reviewed: I have personally reviewed following labs and imaging studies  CBC: Recent Labs  Lab 03/08/21 1050 03/08/21 1100 03/09/21 0559 03/10/21 0345  WBC 6.8  --  6.1 4.7  NEUTROABS 5.2  --   --   --   HGB 15.5 15.3 12.7* 12.1*  HCT 48.4 45.0 38.6* 36.5*  MCV 106.6*  --  104.3* 102.8*  PLT 126*  --  PLATELET CLUMPS NOTED ON SMEAR, UNABLE TO ESTIMATE 81*   Basic Metabolic Panel: Recent Labs  Lab 03/08/21 1050 03/08/21 1100 03/08/21 1651 03/08/21 2322 03/09/21 0559 03/10/21 0345  NA 154* 158* 152* 147* 146* 145  K 4.1 4.0 4.3 3.9 3.9 3.6  CL 121* 122* 115* 113* 115* 114*  CO2 22  --  25 27 26 26   GLUCOSE 141* 144* 109* 124* 97 88  BUN 59* 54* 56* 47* 40* 27*  CREATININE 2.74* 2.90* 2.17* 1.91* 1.55* 1.33*  CALCIUM 10.2  --  10.2 9.6 9.3 8.9  MG  --   --  2.9*  --   --   --    GFR: Estimated Creatinine Clearance: 45.8 mL/min (A) (by C-G formula based on SCr of 1.33 mg/dL (H)). Liver Function Tests: Recent Labs  Lab 03/08/21 1050 03/08/21 1651 03/09/21 0559 03/09/21 1635 03/10/21 0345  AST 80* 83* 63* 54* 43*  ALT 32 37 33 33 29  ALKPHOS 53 49 41 41 43  BILITOT 0.8 0.9 0.6 0.6 0.3  PROT 7.2 7.3  5.7* 5.5* 5.0*  ALBUMIN 4.0 4.0 3.0* 2.9* 2.6*   No results for input(s): LIPASE, AMYLASE in the last 168 hours. No results for input(s): AMMONIA in the last 168 hours. Coagulation Profile: Recent Labs  Lab 03/08/21 1050  INR 1.2   Cardiac Enzymes: Recent Labs  Lab 03/08/21 1050 03/08/21 1651 03/09/21 0559 03/09/21 1635 03/10/21 0345  CKTOTAL 2,185* 1,979* 1,204* 814* 533*   BNP (last 3 results) No results for input(s): PROBNP in the last 8760 hours. HbA1C: No results for input(s): HGBA1C in the last 72 hours. CBG: No results for input(s): GLUCAP in the last 168 hours. Lipid Profile: No results for input(s): CHOL, HDL, LDLCALC, TRIG, CHOLHDL, LDLDIRECT in the last 72 hours. Thyroid Function Tests: Recent Labs    03/08/21 1421  TSH 0.688   Anemia Panel: Recent Labs    03/08/21 1421 03/08/21 1649  VITAMINB12 1,777*  --   FOLATE  --  33.8   Sepsis Labs: Recent Labs  Lab 03/08/21 1050  LATICACIDVEN 1.6    Recent Results (from the past 240 hour(s))  Urine culture     Status: Abnormal   Collection Time: 03/08/21 10:40 AM   Specimen: In/Out Cath Urine  Result Value Ref Range Status   Specimen Description IN/OUT CATH URINE  Final   Special Requests   Final    NONE Performed at [facility], [place] [address] [place], [place] [address]    Culture MULTIPLE SPECIES PRESENT, SUGGEST RECOLLECTION (A)  Final   Report Status 03/09/2021 FINAL  Final  Blood Culture (routine x 2)     Status: None (Preliminary result)   Collection Time: 03/08/21 10:50 AM   Specimen: BLOOD LEFT FOREARM  Result Value Ref Range Status   Specimen Description BLOOD LEFT FOREARM  Final   Special Requests   Final    BOTTLES DRAWN AEROBIC AND ANAEROBIC Blood Culture adequate volume   Culture   Final    NO GROWTH 2 DAYS Performed  at [facility], [place] [address], [place], [place] [address]    Report Status PENDING  Incomplete  Blood Culture (routine x 2)     Status: None  (Preliminary result)   Collection Time: 03/08/21 10:51 AM   Specimen: BLOOD  Result Value Ref Range Status   Specimen Description BLOOD SITE NOT SPECIFIED  Final   Special Requests   Final    BOTTLES DRAWN AEROBIC AND ANAEROBIC Blood Culture adequate volume   Culture   Final    NO GROWTH 2 DAYS Performed at [facility], [address], [place], [place] [address]    Report Status PENDING  Incomplete  Resp Panel by RT-PCR (Flu A&B, Covid) Nasopharyngeal Swab     Status: None   Collection Time: 03/08/21 12:13 PM   Specimen: Nasopharyngeal Swab; Nasopharyngeal(NP) swabs in vial transport medium  Result Value Ref Range Status   SARS Coronavirus 2 by RT PCR NEGATIVE NEGATIVE Final    Comment: (NOTE) SARS-CoV-2 target nucleic acids are NOT DETECTED.  The SARS-CoV-2 RNA is generally detectable in upper respiratory specimens during the acute phase of infection. The lowest concentration of SARS-CoV-2 viral copies this assay can detect is 138 copies/mL. A negative result does not preclude SARS-Cov-2 infection and should not be used as the sole basis for treatment or other patient management decisions. A negative result may occur with  improper specimen collection/handling, submission of specimen other than nasopharyngeal swab, presence of viral mutation(s) within the areas targeted by this assay, and inadequate number of viral copies(<138 copies/mL). A negative result must be combined with clinical observations, patient history, and epidemiological information. The expected result is Negative.  Fact Sheet for Patients:  EntrepreneurPulse.com.au  Fact Sheet for Healthcare Providers:  IncredibleEmployment.be  This test is no t yet approved or cleared by the [place] FDA and  has been authorized for detection and/or diagnosis of SARS-CoV-2 by FDA under an Emergency Use Authorization (EUA). This EUA will remain  in effect (meaning this test  can be used) for the duration of the COVID-19 declaration under Section 564(b)(1) of the Act, 21 U.S.C.section 360bbb-3(b)(1), unless the authorization is terminated  or revoked sooner.       Influenza A by PCR NEGATIVE NEGATIVE Final   Influenza B by PCR NEGATIVE NEGATIVE Final    Comment: (NOTE) The Xpert Xpress SARS-CoV-2/FLU/RSV plus assay is intended as an aid in the diagnosis of influenza from Nasopharyngeal swab specimens and should not be used as a sole basis for treatment. Nasal washings and aspirates are unacceptable for Xpert Xpress SARS-CoV-2/FLU/RSV testing.  Fact Sheet for Patients: EntrepreneurPulse.com.au  Fact Sheet for Healthcare Providers: IncredibleEmployment.be  This test is not yet approved or cleared by the [place] FDA and has been authorized for detection and/or diagnosis of SARS-CoV-2 by FDA under an Emergency Use Authorization (EUA). This EUA will remain in effect (meaning this test can be used) for the duration of the COVID-19 declaration under Section 564(b)(1) of the Act, 21 U.S.C. section 360bbb-3(b)(1), unless the authorization is terminated or revoked.  Performed at [facility], [place] [address], [place], [place] [address]       Imaging Studies   No results found.   Medications   Scheduled Meds:  apixaban  5 mg Oral BID   benztropine  1 mg Oral BID   bictegravir-emtricitabine-tenofovir AF  1 tablet Oral Daily   famotidine  20 mg Oral QHS   feeding supplement  237 mL Oral TID  BM   folic acid  1 mg Oral Daily   gabapentin  600 mg Oral QHS   mirtazapine  15 mg Oral QHS   mometasone-formoterol  2 puff Inhalation BID   montelukast  10 mg Oral Daily   multivitamin with minerals  1 tablet Oral Daily   mupirocin ointment   Topical BID   nicotine  14 mg Transdermal Daily   risperiDONE  2 mg Oral BID   thiamine  100 mg Oral Daily   valbenazine  80 mg Oral QHS   Continuous Infusions:   lactated ringers 75 mL/hr at 03/10/21 0957       LOS: 2 days    Time spent: 25 minutes    Ezekiel Slocumb, DO [facility]  03/10/2021, 6:00 PM      If 7PM-7AM, please contact night-coverage. How to contact the [facility] Attending or Consulting provider [place] or covering provider during after hours [place], for this patient?    Check the care team in [facility] and look for a) attending/consulting TR[facility] provider listed and b) the [facility] team listed Log into [email or web address] and use [place]'s universal password to access. If you do not have the password, please contact the hospital operator. Locate the [facility] provider you are looking for under [facility] and page to a number that you can be directly reached. If you still have difficulty reaching the provider, please page the [facility] (Director on Call) for the Hospitalists listed on amion for assistance.

## 2021-03-10 NOTE — NC FL2 (Signed)
[place] LEVEL OF CARE SCREENING TOOL     IDENTIFICATION  Patient Name: [name] Herring Birthdate: 12/17/1960 Sex: male Admission Date (Current Location): 03/08/2021  [facility] and [place] Number:  Herbalist and Address:  [facility] [place], [place] [address] [place], [place] [address]      Provider Number: [id number]  Attending Physician Name and Address:  Ezekiel Slocumb, DO  Relative Name and Phone Number:       Current Level of Care: Hospital Recommended Level of Care: [place] Prior Approval Number:    Date Approved/Denied:   PASRR Number: pending  Discharge Plan: SNF    Current Diagnoses: Patient Active Problem List   Diagnosis Date Noted   Hypernatremia 03/08/2021   Dehydration 03/08/2021   Rhabdomyolysis 03/08/2021   Schizophrenia ([place]) 02/18/2021   AKI (acute kidney injury) ([place]) 01/15/2020   History of pulmonary embolism 01/15/2020   Genital warts 10/16/2019   GERD with esophagitis 01/22/2019   Protein C deficiency ([place]) 07/05/2017   Benign neoplasm of transverse colon    Emphysema lung ([place]) 11/20/2016   DVT of lower extremity, bilateral ([place]) 11/04/2016   Tobacco abuse 11/02/2016   HTN (hypertension) 08/25/2015   Alcohol use disorder, severe, in early remission ([place]) 12/27/2014   Alcohol abuse 09/26/2014   Human immunodeficiency virus (HIV) disease ([place]) 11/09/2006   CA IN SITU, RECTUM 11/09/2006   Hypothyroidism 11/09/2006   Depression 11/09/2006    Orientation RESPIRATION BLADDER Height & Weight     Self, Place  Normal Continent Weight: 119 lb 7.8 oz (54.2 kg) Height:  5\' 8"  (172.7 cm)  BEHAVIORAL SYMPTOMS/MOOD NEUROLOGICAL BOWEL NUTRITION STATUS      Continent Diet  AMBULATORY STATUS COMMUNICATION OF NEEDS Skin   Limited Assist Verbally Normal                       Personal Care Assistance Level of Assistance  Bathing, Feeding, Dressing Bathing Assistance: Limited  assistance Feeding assistance: Independent Dressing Assistance: Limited assistance     Functional Limitations Info  Sight, Hearing, Speech Sight Info: Adequate Hearing Info: Adequate Speech Info: Adequate    SPECIAL CARE FACTORS FREQUENCY  PT (By licensed PT), OT (By licensed OT)     PT Frequency: 5x a week OT Frequency: 5x a week            Contractures Contractures Info: Not present    Additional Factors Info  Code Status, Allergies Code Status Info: Full Allergies Info: Amoxicillin   Latex   Abilify           Current Medications (03/10/2021):  This is the current hospital active medication list Current Facility-Administered Medications  Medication Dose Route Frequency Provider Last Rate Last Admin   acetaminophen (TYLENOL) tablet 650 mg  650 mg Oral Q6H PRN Orma Flaming, MD       Or   acetaminophen (TYLENOL) suppository 650 mg  650 mg Rectal Q6H PRN Orma Flaming, MD       albuterol (PROVENTIL) (2.5 MG/3ML) 0.083% nebulizer solution 2.5 mg  2.5 mg Nebulization Q6H PRN Orma Flaming, MD       apixaban Arne [place]) tablet 5 mg  5 mg Oral BID Orma Flaming, MD   5 mg at 03/10/21 0957   benztropine (COGENTIN) tablet 1 mg  1 mg Oral BID Orma Flaming, MD   1 mg at 03/10/21 0957   bictegravir-emtricitabine-tenofovir AF (BIKTARVY) 50-200-25 MG per tablet 1 tablet  1 tablet Oral Daily Nicole Kindred A, DO   1 tablet at 03/10/21 0957   famotidine (PEPCID) tablet 20 mg  20 mg Oral QHS Nicole Kindred A, DO       feeding supplement (ENSURE ENLIVE / ENSURE PLUS) liquid 237 mL  237 mL Oral TID BM Nicole Kindred A, DO       fluticasone (FLONASE) 50 MCG/ACT nasal spray 1-2 spray  1-2 spray Each Nare Daily PRN Orma Flaming, MD       folic acid (FOLVITE) tablet 1 mg  1 mg Oral Daily Orma Flaming, MD   1 mg at 03/10/21 0956   gabapentin (NEURONTIN) tablet 600 mg  600 mg Oral QHS Orma Flaming, MD   600 mg at 03/09/21 2141   hydrOXYzine (ATARAX/VISTARIL) tablet 25 mg  25 mg  Oral Q6H PRN Orma Flaming, MD   25 mg at 03/09/21 2141   lactated ringers infusion   Intravenous Continuous Nicole Kindred A, DO 75 mL/hr at 03/10/21 0957 Rate Change at 03/10/21 0957   mirtazapine (REMERON) tablet 15 mg  15 mg Oral QHS Orma Flaming, MD   15 mg at 03/09/21 2141   mometasone-formoterol (DULERA) 200-5 MCG/ACT inhaler 2 puff  2 puff Inhalation BID Orma Flaming, MD   2 puff at 03/10/21 0938   montelukast (SINGULAIR) tablet 10 mg  10 mg Oral Daily Orma Flaming, MD   10 mg at 03/10/21 8882   multivitamin with minerals tablet 1 tablet  1 tablet Oral Daily Orma Flaming, MD   1 tablet at 03/09/21 2141   mupirocin ointment (BACTROBAN) 2 %   Topical BID Ezekiel Slocumb, DO   Given at 03/10/21 0957   nicotine (NICODERM CQ - dosed in mg/24 hours) patch 14 mg  14 mg Transdermal Daily Orma Flaming, MD   14 mg at 03/10/21 1000   risperiDONE (RISPERDAL) tablet 2 mg  2 mg Oral BID Orma Flaming, MD   2 mg at 03/10/21 8003   thiamine tablet 100 mg  100 mg Oral Daily Orma Flaming, MD   100 mg at 03/10/21 0956   traZODone (DESYREL) tablet 50 mg  50 mg Oral QHS PRN Orma Flaming, MD       valbenazine [facility]) capsule 80 mg  80 mg Oral QHS Orma Flaming, MD   80 mg at 03/09/21 2140     Discharge Medications: Please see discharge summary for a list of discharge medications.  Relevant Imaging Results:  Relevant Lab Results:   Additional Information SSN [id number], covid vaccinated, unsure about booster  Emeterio Reeve, LCSWA

## 2021-03-10 NOTE — Social Work (Signed)
[name] Herring 04-Jul-1961   Please be advised that the above-named patient will require a short-term nursing home stay - anticipated 30 days or less for rehabilitation and strengthening.  The plan is for return home.

## 2021-03-10 NOTE — Evaluation (Signed)
Physical Therapy Evaluation Patient Details Name: [name] Herring MRN: [id number] DOB: March 17, 1961 Today's Date: 03/10/2021   History of Present Illness  Pt is a 60 year old man admitted 03/09/21 from home where he was living in squalor. + dehydration, rhamdomyolosis, AKI, hypernatremia. PMH: HIV, hypothyroidism, depression, alcohol and tobacco dependence, emphysema, personality disorder, schizophrenia.  Clinical Impression  Pt was seen for mobility on RW at side of bed and noted his safety and posture were poor.  Pt has apparent contractures of hips and weak in his legs, which collectively keeps him from being safe to walk away from bed today.  Follow along for progressing safety and distances of gait, with ROM to legs being encouraged.  Pt is possibly going to live with his daughter, but will recommend that he get rehab first to safely move to home.    Follow Up Recommendations SNF    Equipment Recommendations  None recommended by PT    Recommendations for Other Services       Precautions / Restrictions Precautions Precautions: Fall Precaution Comments: external catheter, IV, telemetry Restrictions Weight Bearing Restrictions: No      Mobility  Bed Mobility Overal bed mobility: Needs Assistance Bed Mobility: Supine to Sit;Sit to Supine     Supine to sit: Mod assist Sit to supine: Mod assist   General bed mobility comments: assist to raise trunk and for LEs back into bed    Transfers Overall transfer level: Needs assistance Equipment used: Rolling walker (2 wheeled);1 person hand held assist Transfers: Sit to/from Stand Sit to Stand: Mod assist         General transfer comment: pt was light headed but ck of BP reveals no orthostatic drop  Ambulation/Gait Ambulation/Gait assistance: Min assist;Min guard Gait Distance (Feet): 16 Feet (8+8) Assistive device: Rolling walker (2 wheeled);1 person hand held assist   Gait velocity: variable Gait velocity interpretation:  <1.8 ft/sec, indicate of risk for recurrent falls General Gait Details: Pt walks in a squatted posture, hips are tight and cannot fully stand  Stairs            Wheelchair Mobility    Modified Rankin (Stroke Patients Only)       Balance Overall balance assessment: Needs assistance   Sitting balance-Leahy Scale: Fair     Standing balance support: Bilateral upper extremity supported;During functional activity Standing balance-Leahy Scale: Poor Standing balance comment: requires UE support to stay in standing due to contractures and LE weakness                             Pertinent Vitals/Pain Pain Assessment: No/denies pain Pain Location: chest Pain Descriptors / Indicators: Discomfort Pain Intervention(s): Repositioned;Monitored during session;Other (comment) (RN aware)    Home Living Family/patient expects to be discharged to:: Private residence Living Arrangements: Alone   Type of Home: Apartment Home Access: Stairs to enter Entrance Stairs-Rails: Psychiatric nurse of Steps: 2 flights Home Layout: One [place]: Environmental consultant - 2 wheels Additional Comments: pt is fairly sure of his history    Prior Function Level of Independence: Independent with assistive device(s)         Comments: RW for gait and uses cab for errands (did not have food recently)     Hand Dominance   Dominant Hand: Right    Extremity/Trunk Assessment   Upper Extremity Assessment Upper Extremity Assessment: Defer to OT evaluation    Lower Extremity Assessment Lower Extremity Assessment: Generalized  weakness (R>L weakness)    Cervical / Trunk Assessment Cervical / Trunk Assessment: Kyphotic  Communication   Communication: No difficulties  Cognition Arousal/Alertness: Awake/alert Behavior During Therapy: Flat affect Overall Cognitive Status: Impaired/Different from baseline Area of Impairment: Safety/judgement;Awareness;Problem solving                          Safety/Judgement: Decreased awareness of safety;Decreased awareness of deficits Awareness: Intellectual Problem Solving: Requires verbal cues;Requires tactile cues;Decreased initiation General Comments: pt's daughter had been calling the room, phone was not where pt could reach, but he did not initiate using call buttong to ask for help      General Comments General comments (skin integrity, edema, etc.): pt is up to walk with help and had to remain at bedside due to his difficulty with posture and stability    Exercises     Assessment/Plan    PT Assessment Patient needs continued PT services  PT Problem List Decreased strength;Decreased range of motion;Decreased activity tolerance;Decreased balance;Decreased mobility;Decreased coordination;Decreased cognition;Decreased knowledge of use of DME;Decreased safety awareness;Cardiopulmonary status limiting activity       PT Treatment Interventions DME instruction;Gait training;Stair training;Functional mobility training;Therapeutic activities;Therapeutic exercise;Balance training;Neuromuscular re-education;Patient/family education    PT Goals (Current goals can be found in the Care Plan section)  Acute Rehab PT Goals Patient Stated Goal: to feel better PT Goal Formulation: With family Time For Goal Achievement: 03/24/21 Potential to Achieve Goals: Fair    Frequency Min 3X/week   Barriers to discharge Inaccessible home environment;Decreased caregiver support home alone in 2 floor situation    Co-evaluation               AM-PAC PT "6 Clicks" Mobility  Outcome Measure Help needed turning from your back to your side while in a flat bed without using bedrails?: A Lot Help needed moving from lying on your back to sitting on the side of a flat bed without using bedrails?: A Lot Help needed moving to and from a bed to a chair (including a wheelchair)?: A Lot Help needed standing up from a chair using  your arms (e.g., wheelchair or bedside chair)?: A Lot Help needed to walk in hospital room?: A Little Help needed climbing 3-5 steps with a railing? : Total 6 Click Score: 12    End of Session Equipment Utilized During Treatment: Gait belt Activity Tolerance: Treatment limited secondary to medical complications (Comment);Patient limited by fatigue Patient left: in bed;with bed alarm set;with call bell/phone within reach Nurse Communication: Mobility status PT Visit Diagnosis: Unsteadiness on feet (R26.81);Muscle weakness (generalized) (M62.81);Difficulty in walking, not elsewhere classified (R26.2)    Time: 7858-8502 PT Time Calculation (min) (ACUTE ONLY): 25 min   Charges:   PT Evaluation $PT Eval Moderate Complexity: 1 Mod PT Treatments $Gait Training: 8-22 mins       Ramond Dial 03/10/2021, 12:00 PM  Mee Hives, PT MS Acute Rehab Dept. Number: [place]

## 2021-03-10 NOTE — Progress Notes (Signed)
Cross-coverage note:   Patient woke with burning discomfort in chest, no respiratory symptoms, and no change in vitals. Symptoms subsided with GI cocktail.

## 2021-03-10 NOTE — Progress Notes (Signed)
This chaplain responded to RN-Samantha's page for Pt. Advance Directive education.  The Pt. daughter-[name] is at the bedside.  The Pt. is able to complete the AD education and understands the role of HCPOA and purpose of a Living Will.  The chaplain understands [facility] will assist the Pt. in filling out the document.    This chaplain will contact spiritual care for F/U and coordination of notary services on Friday.  The Pt. has an AD document in the room.

## 2021-03-11 LAB — BASIC METABOLIC PANEL
Anion gap: 6 (ref 5–15)
BUN: 14 mg/dL (ref 6–20)
CO2: 25 mmol/L (ref 22–32)
Calcium: 8.9 mg/dL (ref 8.9–10.3)
Chloride: 112 mmol/L — ABNORMAL HIGH (ref 98–111)
Creatinine, Ser: 0.98 mg/dL (ref 0.61–1.24)
GFR, Estimated: >60 mL/min (ref >60)
Glucose, Bld: 94 mg/dL (ref 70–99)
Potassium: 3.5 mmol/L (ref 3.5–5.1)
Sodium: 143 mmol/L (ref 135–145)

## 2021-03-11 LAB — MAGNESIUM: Magnesium: 1.9 mg/dL (ref 1.7–2.4)

## 2021-03-11 LAB — PHOSPHORUS: Phosphorus: 2.7 mg/dL (ref 2.5–4.6)

## 2021-03-11 LAB — TROPONIN I (HIGH SENSITIVITY): Troponin I (High Sensitivity): 7 ng/L (ref <18)

## 2021-03-11 MED ORDER — LOSARTAN POTASSIUM 25 MG PO TABS
25.0000 mg | ORAL_TABLET | Freq: Every day | ORAL | Status: DC
  Administered 2021-03-11 – 2021-03-18 (×8): 25 mg via ORAL
  Filled 2021-03-11 (×8): qty 1

## 2021-03-11 MED ORDER — ATORVASTATIN CALCIUM 40 MG PO TABS
40.0000 mg | ORAL_TABLET | Freq: Every day | ORAL | Status: DC
  Administered 2021-03-11 – 2021-03-17 (×7): 40 mg via ORAL
  Filled 2021-03-11 (×7): qty 1

## 2021-03-11 MED ORDER — QUETIAPINE FUMARATE 25 MG PO TABS
25.0000 mg | ORAL_TABLET | Freq: Every day | ORAL | Status: DC
  Administered 2021-03-11 – 2021-03-17 (×7): 25 mg via ORAL
  Filled 2021-03-11 (×7): qty 1

## 2021-03-11 MED ORDER — PANTOPRAZOLE SODIUM 40 MG PO TBEC
40.0000 mg | DELAYED_RELEASE_TABLET | Freq: Every day | ORAL | Status: DC
  Administered 2021-03-11 – 2021-03-13 (×3): 40 mg via ORAL
  Filled 2021-03-11 (×3): qty 1

## 2021-03-11 MED ORDER — AMLODIPINE BESYLATE 5 MG PO TABS
5.0000 mg | ORAL_TABLET | Freq: Every day | ORAL | Status: DC
  Administered 2021-03-11 – 2021-03-18 (×9): 5 mg via ORAL
  Filled 2021-03-11 (×9): qty 1

## 2021-03-11 MED ORDER — ALUM & MAG HYDROXIDE-SIMETH 200-200-20 MG/5ML PO SUSP
30.0000 mL | Freq: Once | ORAL | Status: AC
  Administered 2021-03-11: 30 mL via ORAL
  Filled 2021-03-11: qty 30

## 2021-03-11 MED ORDER — HYDRALAZINE HCL 25 MG PO TABS: 25.0000 mg | ORAL_TABLET | Freq: Four times a day (QID) | ORAL | Status: DC | PRN

## 2021-03-11 NOTE — Evaluation (Addendum)
Occupational Therapy Progress Note Patient Details Name: [name] Herring MRN: [id number] DOB: [birth year] Today's Date: 03/11/2021    History of Present Illness Pt is a 60 year old man admitted 03/09/21 from home where he was living in squalor. + dehydration, rhamdomyolosis, AKI, hypernatremia. PMH: HIV, hypothyroidism, depression, alcohol and tobacco dependence, emphysema, personality disorder, schizophrenia.   Clinical Impression   Pt feeling better. Able to perform bed mobility with supervision, stood with mod assist and marched in place with RW. He demonstrated adequate UE strength to open containers. He was able to don socks in sitting with close supervision, but no LOB. Continues to be appropriate for SNF level rehab. He reports his daughter wants him to move closer to her home in [place].     Follow Up Recommendations  SNF;Supervision/Assistance - 24 hour    Equipment Recommendations  3 in 1 bedside commode    Recommendations for Other Services       Precautions / Restrictions Precautions Precautions: Fall      Mobility Bed Mobility Overal bed mobility: Needs Assistance Bed Mobility: Supine to Sit;Sit to Supine     Supine to sit: Supervision Sit to supine: Supervision   General bed mobility comments: no physical assist, HOB up 30 degrees, use of rail    Transfers Overall transfer level: Needs assistance Equipment used: Rolling walker (2 wheeled) Transfers: Sit to/from Stand Sit to Stand: Mod assist         General transfer comment: assist to rise and steady, pt declined getting to chair, but did march in place    Balance Overall balance assessment: Needs assistance   Sitting balance-Leahy Scale: Good       Standing balance-Leahy Scale: Poor Standing balance comment: min assist and UE support of walker                           ADL either performed or assessed with clinical judgement   ADL Overall ADL's : Needs  assistance/impaired Eating/Feeding: Independent;Bed level Eating/Feeding Details (indicate cue type and reason): able to open can of soda and graham crackers Grooming: Wash/dry hands;Wash/dry face;Sitting;Supervision/safety               Lower Body Dressing: Supervision/safety;Sitting/lateral leans Lower Body Dressing Details (indicate cue type and reason): demonstrated ability to access feet to don socks from EOB                     Vision         Perception     Praxis      Pertinent Vitals/Pain Pain Assessment: No/denies pain     Hand Dominance     Extremity/Trunk Assessment             Communication     Cognition Arousal/Alertness: Awake/alert Behavior During Therapy: WFL for tasks assessed/performed Overall Cognitive Status: Impaired/Different from baseline Area of Impairment: Safety/judgement;Awareness;Problem solving                         Safety/Judgement: Decreased awareness of safety;Decreased awareness of deficits   Problem Solving: Requires verbal cues;Requires tactile cues;Decreased initiation     General Comments       Exercises     Shoulder Instructions      Home Living  Prior Functioning/Environment                   OT Problem List:        OT Treatment/Interventions:      OT Goals(Current goals can be found in the care plan section) Acute Rehab OT Goals Patient Stated Goal: to feel better OT Goal Formulation: With patient Time For Goal Achievement: 03/24/21 Potential to Achieve Goals: Good  OT Frequency: Min 2X/week   Barriers to D/C:            Co-evaluation              AM-PAC OT "6 Clicks" Daily Activity     Outcome Measure Help from another person eating meals?: None Help from another person taking care of personal grooming?: A Little Help from another person toileting, which includes using toliet, bedpan, or urinal?: A  Lot Help from another person bathing (including washing, rinsing, drying)?: A Lot Help from another person to put on and taking off regular upper body clothing?: A Little Help from another person to put on and taking off regular lower body clothing?: A Little 6 Click Score: 17   End of Session Equipment Utilized During Treatment: Gait belt;Rolling walker  Activity Tolerance: Patient tolerated treatment well Patient left: in bed;with call bell/phone within reach;with bed alarm set  OT Visit Diagnosis: Muscle weakness (generalized) (M62.81)                Time: 9794-8016 OT Time Calculation (min): 19 min Charges:  OT General Charges $OT Visit: 1 Visit OT Treatments $Self Care/Home Management : 8-22 mins  Nestor Lewandowsky, OTR/L Acute Rehabilitation Services Pager: [phone] Office: [phone]   Malka So 03/11/2021, 3:17 PM

## 2021-03-11 NOTE — Progress Notes (Signed)
PROGRESS NOTE    [name] Herring   FTD:[id number]  DOB: 07-Jul-1961  PCP: Sonia Side., FNP    DOA: 03/08/2021 LOS: 3   Assessment & Plan   Principal Problem:   Dehydration Active Problems:   Human immunodeficiency virus (HIV) disease ([place])   Hypothyroidism   Depression   Alcohol abuse   HTN (hypertension)   Tobacco abuse   Emphysema lung (HCC)   AKI (acute kidney injury) ([place])   History of pulmonary embolism   Schizophrenia (HCC)   Hypernatremia   Rhabdomyolysis   Protein-calorie malnutrition, severe   Dehydration / Hypernatremia / Rhabdomyolysis - all POA. Pt reported very little PO intake x 2 weeks prior to admission. Given bolus IV fluids in ED. CK trending down; renal function improving --continue IV hydration --monitor BMP's --Seroquel and statin on hold with rhabdo --Monitor liver/renal function and electrolytes --Encourage PO intake --Dietician consulted  AKI - due to above, pre-renal azotemia.  Improving. --continue IV hydration  Epigastric pain / ?Hx of gastric ulcer Chest/epigastric pain AM of 6/16 relieved by GI cocktail. No EKG changes to suggest acute ischemia. --Pepcid qHS  Severe protein calorie malnutrition - with temporal waisting, Body mass index is 18.17 kg/m.  Albumin 2.6.  Due to inadequate caloric intake and possibly HIV. --Dietician following, appreciate recs  Poor Living Situation - hx provided, pt was living in squalor and not caring for himself or with anyone helping him. --TOC consulted --Daughter hopes to move pt closer to her in [place]  General debility / Generalized Weakness / Ambulator Dysfunction --PT and OT recommending SNF --TOC following for placement  Hx of hypertension: normotensive to soft BP with home meds held.  Med hx shows regimen is amlodipine 10 mg daily, clonidine 0.2 mg TID, lisinopril 40 mg daily, Lopressor 25 mg BID. 6/17: BP's running high, resume some meds --Start losartan in place of  lisinopril --Later today added back amlodipine --Suspect has not been taking --Monitor BP closely, watch for rebound HTN   Hx of HIV - on Biktarvy - initially held due to renal function, resumed 6/15.  Hypothyroidism - Not on medication.  TSH normal at 0.688.  COPD/emphysema -stable, not exacerbated.  Continue home inhaler regimen and as needed albuterol  History of PE/DVT -continue Eliquis  Depression -continue home medication  History of alcohol abuse -patient reported not drinking alcohol in 2 months.  Started on thiamine, multivitamin and folic acid on admission.  No signs of withdrawal so far.  On renally dosed gabapentin, continue for now.  Monitor closely.  Tobacco abuse -nicotine patch ordered  Thrombocytosis -likely reactive, history of having thrombocytopenia.  Monitor CBC.  Outpatient follow-up if needed.  Macrocytosis -likely due to chronic alcohol consumption.  Follow-up B12 and folate  Schizophrenia - appears clinically stable.  Continue all other home medications except for Seroquel given elevated CK, resume once CK is improved.    Patient BMI: Body mass index is 18.17 kg/m.   DVT prophylaxis:  apixaban (ELIQUIS) tablet 5 mg   Diet:  Diet Orders (From admission, onward)     Start     Ordered   03/08/21 1421  Diet regular Room service appropriate? Yes; Fluid consistency: Thin  Diet effective now       Question Answer Comment  Room service appropriate? Yes   Fluid consistency: Thin      03/08/21 1423              Code Status: Full Code   Brief  Narrative / Hospital Course to Date:   [name] Herring is a 60 y.o. male with medical history significant of HIV, HTN, hx of PE/DVT on eliquis and IVC filter placement in 2018, emphysema, depression, personality disorder,  tobacco abuse (1/2 PPD), schizophrenia, alcohol abuse (has not had a drink in 2 months) and gastric ucler he thinks 3 years ago who presents to ER after fall x 3 days ago due to an episode of  dizziness.  See full H&P for further details.  Patient's daughter lives in [place] and she came to check on him after not hearing from him in 2 weeks and found bugs everywhere and called EMS, contacted patient's Education officer, museum.  Patient is daughter reports ports she thought an aunt who lives locally had been checking in on patient.   Recently admitted at behavior health for worsening comman auditory hallucination and suicidal ideations -patient denied these complaints on admission.    Evaluation in the ED consistent with dehydration including acute kidney injury, hypernatremia and rhabdomyolysis.  These are being treated with IV hydration.  TOC, PT, OT are consulted for disposition planning.    Subjective 03/11/21    Patient awake laying in bed.  Reports again having epigastric/chest pain this AM, started overnight.  Similar to two mornings ago but not quite as bad.  No palpitation or SOB associated with it.  No N/V.     Disposition Plan & Communication   Status is: Inpatient  Remains inpatient appropriate because:IV treatments appropriate due to intensity of illness or inability to take PO  Dispo: The patient is from: Home              Anticipated d/c is to: SNF              Patient currently is not medically stable to d/c.   Difficult to place patient TBD   Family Communication: None present, will attempt to call daughter   Consults, Procedures, Significant Events   Consultants:  None  Procedures:  None  Antimicrobials:  Anti-infectives (From admission, onward)    Start     Dose/Rate Route Frequency Ordered Stop   03/09/21 1200  bictegravir-emtricitabine-tenofovir AF (BIKTARVY) 50-200-25 MG per tablet 1 tablet        1 tablet Oral Daily 03/09/21 1057           Micro    Objective   Vitals:   03/11/21 0444 03/11/21 1214 03/11/21 1824 03/11/21 1847  BP: (!) 143/91 (!) 146/97 (!) 158/101 (!) 153/99  Pulse: 63 81 84   Resp: 18  16   Temp: 98.3 F (36.8 C)  98.4 F (36.9 C) 98.5 F (36.9 C)   TempSrc: Oral Oral Oral   SpO2: 95% 95% 95%   Weight:      Height:        Intake/Output Summary (Last 24 hours) at 03/11/2021 2129 Last data filed at 03/11/2021 0700 Gross per 24 hour  Intake 922.28 ml  Output 600 ml  Net 322.28 ml   Filed Weights   03/08/21 1038 03/08/21 2220  Weight: 58 kg 54.2 kg    Physical Exam:  General exam: awake sitting up in bed, no acute distress, cachectic Respiratory system: CTAB, no wheezes, rales or rhonchi, normal respiratory effort. Cardiovascular system: normal S1/S2, RRR, no pedal edema.   Gastrointestinal system: soft, NT, ND Central nervous system: alert, no gross focal neurologic deficits, normal speech Psychiatry: normal mood, congruent affect  Labs   Data Reviewed: I have personally  reviewed following labs and imaging studies  CBC: Recent Labs  Lab 03/08/21 1050 03/08/21 1100 03/09/21 0559 03/10/21 0345  WBC 6.8  --  6.1 4.7  NEUTROABS 5.2  --   --   --   HGB 15.5 15.3 12.7* 12.1*  HCT 48.4 45.0 38.6* 36.5*  MCV 106.6*  --  104.3* 102.8*  PLT 126*  --  PLATELET CLUMPS NOTED ON SMEAR, UNABLE TO ESTIMATE 81*   Basic Metabolic Panel: Recent Labs  Lab 03/08/21 1651 03/08/21 2322 03/09/21 0559 03/10/21 0345 03/11/21 0337  NA 152* 147* 146* 145 143  K 4.3 3.9 3.9 3.6 3.5  CL 115* 113* 115* 114* 112*  CO2 25 27 26 26 25   GLUCOSE 109* 124* 97 88 94  BUN 56* 47* 40* 27* 14  CREATININE 2.17* 1.91* 1.55* 1.33* 0.98  CALCIUM 10.2 9.6 9.3 8.9 8.9  MG 2.9*  --   --   --  1.9  PHOS  --   --   --   --  2.7   GFR: Estimated Creatinine Clearance: 62.2 mL/min (by C-G formula based on SCr of 0.98 mg/dL). Liver Function Tests: Recent Labs  Lab 03/08/21 1050 03/08/21 1651 03/09/21 0559 03/09/21 1635 03/10/21 0345  AST 80* 83* 63* 54* 43*  ALT 32 37 33 33 29  ALKPHOS 53 49 41 41 43  BILITOT 0.8 0.9 0.6 0.6 0.3  PROT 7.2 7.3 5.7* 5.5* 5.0*  ALBUMIN 4.0 4.0 3.0* 2.9* 2.6*   No results  for input(s): LIPASE, AMYLASE in the last 168 hours. No results for input(s): AMMONIA in the last 168 hours. Coagulation Profile: Recent Labs  Lab 03/08/21 1050  INR 1.2   Cardiac Enzymes: Recent Labs  Lab 03/08/21 1050 03/08/21 1651 03/09/21 0559 03/09/21 1635 03/10/21 0345  CKTOTAL 2,185* 1,979* 1,204* 814* 533*   BNP (last 3 results) No results for input(s): PROBNP in the last 8760 hours. HbA1C: No results for input(s): HGBA1C in the last 72 hours. CBG: No results for input(s): GLUCAP in the last 168 hours. Lipid Profile: No results for input(s): CHOL, HDL, LDLCALC, TRIG, CHOLHDL, LDLDIRECT in the last 72 hours. Thyroid Function Tests: No results for input(s): TSH, T4TOTAL, FREET4, T3FREE, THYROIDAB in the last 72 hours.  Anemia Panel: No results for input(s): VITAMINB12, FOLATE, FERRITIN, TIBC, IRON, RETICCTPCT in the last 72 hours.  Sepsis Labs: Recent Labs  Lab 03/08/21 1050  LATICACIDVEN 1.6    Recent Results (from the past 240 hour(s))  Urine culture     Status: Abnormal   Collection Time: 03/08/21 10:40 AM   Specimen: In/Out Cath Urine  Result Value Ref Range Status   Specimen Description IN/OUT CATH URINE  Final   Special Requests   Final    NONE Performed at [facility], [address] [place], [place] [address]    Culture MULTIPLE SPECIES PRESENT, SUGGEST RECOLLECTION (A)  Final   Report Status 03/09/2021 FINAL  Final  Blood Culture (routine x 2)     Status: None (Preliminary result)   Collection Time: 03/08/21 10:50 AM   Specimen: BLOOD LEFT FOREARM  Result Value Ref Range Status   Specimen Description BLOOD LEFT FOREARM  Final   Special Requests   Final    BOTTLES DRAWN AEROBIC AND ANAEROBIC Blood Culture adequate volume   Culture   Final    NO GROWTH 3 DAYS Performed at [facility], [place] [address] [place], [place] [address]    Report Status PENDING  Incomplete  Blood Culture (routine x 2)     Status: None (Preliminary  result)   Collection Time: 03/08/21 10:51 AM   Specimen: BLOOD  Result Value Ref Range Status   Specimen Description BLOOD SITE NOT SPECIFIED  Final   Special Requests   Final    BOTTLES DRAWN AEROBIC AND ANAEROBIC Blood Culture adequate volume   Culture   Final    NO GROWTH 3 DAYS Performed at [facility], [address], [place], [place] [address]    Report Status PENDING  Incomplete  Resp Panel by RT-PCR (Flu A&B, Covid) Nasopharyngeal Swab     Status: None   Collection Time: 03/08/21 12:13 PM   Specimen: Nasopharyngeal Swab; Nasopharyngeal(NP) swabs in vial transport medium  Result Value Ref Range Status   SARS Coronavirus 2 by RT PCR NEGATIVE NEGATIVE Final    Comment: (NOTE) SARS-CoV-2 target nucleic acids are NOT DETECTED.  The SARS-CoV-2 RNA is generally detectable in upper respiratory specimens during the acute phase of infection. The lowest concentration of SARS-CoV-2 viral copies this assay can detect is 138 copies/mL. A negative result does not preclude SARS-Cov-2 infection and should not be used as the sole basis for treatment or other patient management decisions. A negative result may occur with  improper specimen collection/handling, submission of specimen other than nasopharyngeal swab, presence of viral mutation(s) within the areas targeted by this assay, and inadequate number of viral copies(<138 copies/mL). A negative result must be combined with clinical observations, patient history, and epidemiological information. The expected result is Negative.  Fact Sheet for Patients:  EntrepreneurPulse.com.au  Fact Sheet for Healthcare Providers:  IncredibleEmployment.be  This test is no t yet approved or cleared by the [place] FDA and  has been authorized for detection and/or diagnosis of SARS-CoV-2 by FDA under an Emergency Use Authorization (EUA). This EUA will remain  in effect (meaning this test can be used)  for the duration of the COVID-19 declaration under Section 564(b)(1) of the Act, 21 U.S.C.section 360bbb-3(b)(1), unless the authorization is terminated  or revoked sooner.       Influenza A by PCR NEGATIVE NEGATIVE Final   Influenza B by PCR NEGATIVE NEGATIVE Final    Comment: (NOTE) The Xpert Xpress SARS-CoV-2/FLU/RSV plus assay is intended as an aid in the diagnosis of influenza from Nasopharyngeal swab specimens and should not be used as a sole basis for treatment. Nasal washings and aspirates are unacceptable for Xpert Xpress SARS-CoV-2/FLU/RSV testing.  Fact Sheet for Patients: EntrepreneurPulse.com.au  Fact Sheet for Healthcare Providers: IncredibleEmployment.be  This test is not yet approved or cleared by the [place] FDA and has been authorized for detection and/or diagnosis of SARS-CoV-2 by FDA under an Emergency Use Authorization (EUA). This EUA will remain in effect (meaning this test can be used) for the duration of the COVID-19 declaration under Section 564(b)(1) of the Act, 21 U.S.C. section 360bbb-3(b)(1), unless the authorization is terminated or revoked.  Performed at [facility], [place] [address], [place], [place] [address]       Imaging Studies   No results found.   Medications   Scheduled Meds:  amLODipine  5 mg Oral Daily   apixaban  5 mg Oral BID   atorvastatin  40 mg Oral QHS   benztropine  1 mg Oral BID   bictegravir-emtricitabine-tenofovir AF  1 tablet Oral Daily   famotidine  20 mg Oral QHS   feeding supplement  237 mL Oral TID BM  folic acid  1 mg Oral Daily   gabapentin  600 mg Oral QHS   losartan  25 mg Oral Daily   mirtazapine  15 mg Oral QHS   mometasone-formoterol  2 puff Inhalation BID   montelukast  10 mg Oral Daily   multivitamin with minerals  1 tablet Oral Daily   mupirocin ointment   Topical BID   nicotine  14 mg Transdermal Daily   pantoprazole  40 mg Oral Daily    QUEtiapine  25 mg Oral QHS   risperiDONE  2 mg Oral BID   thiamine  100 mg Oral Daily   valbenazine  80 mg Oral QHS   Continuous Infusions:       LOS: 3 days    Time spent: 25 minutes    Ezekiel Slocumb, DO [facility]  03/11/2021, 9:29 PM      If 7PM-7AM, please contact night-coverage. How to contact the [facility] Attending or Consulting provider [place] or covering provider during after hours [place], for this patient?    Check the care team in [facility] and look for a) attending/consulting TRH provider listed and b) the [facility] team listed Log into [email or web address] and use [place]'s universal password to access. If you do not have the password, please contact the hospital operator. Locate the [facility] provider you are looking for under [facility] and page to a number that you can be directly reached. If you still have difficulty reaching the provider, please page the [facility] (Director on Call) for the Hospitalists listed on amion for assistance.

## 2021-03-11 NOTE — Care Management Important Message (Signed)
Important Message  Patient Details  Name: [name] Herring MRN: [id number] Date of Birth: 07-Mar-1961   Medicare Important Message Given:  Yes - Important Message mailed due to current National Emergency   Verbal consent obtained due to current National Emergency  Relationship to patient: Self Contact Name: Jefferey Call Date: 03/11/21  Time: 1003 Phone: [phone] Outcome: Spoke with contact Important Message mailed to: Patient address on file   [place] 03/11/2021, 10:04 AM

## 2021-03-11 NOTE — TOC Progression Note (Signed)
Transition of Care [facility]) - Progression Note    Patient Details  Name: [name] Herring MRN: [id number] Date of Birth: 08-25-1961  Transition of Care [facility]) CM/SW [place], [place] Phone Number: 03/11/2021, 2:37 PM  Clinical Narrative:     No SNF bed offers at this time. CSW extended bed search in hub further [place] as pt daughter interested in getting him closer to her in [place].   CSW uploaded PASSR documents to [facility].   Expected Discharge Plan: [place] Barriers to Discharge: Continued Medical Work up  Expected Discharge Plan and Services Expected Discharge Plan: [place] arrangements for the past 2 months: Single Family Home                                       Social Determinants of Health (SDOH) Interventions    Readmission Risk Interventions No flowsheet data found.

## 2021-03-11 NOTE — Progress Notes (Signed)
   03/11/21 1555  Clinical Encounter Type  Visited With Patient  Visit Type Follow-up  Referral From Chaplain  Consult/Referral To Chaplain  Spiritual Encounters  Spiritual Needs Literature (AD)  Chaplain called and spoke with [name] Herring, and he informed me he needed the AD paperwork, and his daughter will be visiting him tomorrow and will help him fill out the paperwork.  I explained the AD will have to be notarized on Monday because our Notary will not be back until Monday. He understood and agreed.    Chaplain Sally-Ann Cutbirth Morgan-Simpson  [phone]

## 2021-03-11 NOTE — Progress Notes (Signed)
   03/11/21 0947  Clinical Encounter Type  Visited With Patient not available  Visit Type Follow-up (AD)  Referral From Chaplain  Consult/Referral To [place] made a visit to follow-up on Mr. Knueppel AD. Pt was sleep and his daughter was not present.  Will follow-up with Mr. Lewers later this afternoon.   Chaplain Nikcole Eischeid Morgan-Simpson [phone]

## 2021-03-12 LAB — COMPREHENSIVE METABOLIC PANEL
ALT: 30 U/L (ref 0–44)
AST: 24 U/L (ref 15–41)
Albumin: 2.7 g/dL — ABNORMAL LOW (ref 3.5–5.0)
Alkaline Phosphatase: 46 U/L (ref 38–126)
Anion gap: 6 (ref 5–15)
BUN: 17 mg/dL (ref 6–20)
CO2: 25 mmol/L (ref 22–32)
Calcium: 9.3 mg/dL (ref 8.9–10.3)
Chloride: 110 mmol/L (ref 98–111)
Creatinine, Ser: 0.95 mg/dL (ref 0.61–1.24)
GFR, Estimated: >60 mL/min (ref >60)
Glucose, Bld: 89 mg/dL (ref 70–99)
Potassium: 3.7 mmol/L (ref 3.5–5.1)
Sodium: 141 mmol/L (ref 135–145)
Total Bilirubin: 0.6 mg/dL (ref 0.3–1.2)
Total Protein: 5.3 g/dL — ABNORMAL LOW (ref 6.5–8.1)

## 2021-03-12 LAB — CK: Total CK: 135 U/L (ref 49–397)

## 2021-03-12 LAB — GLUCOSE, CAPILLARY: Glucose-Capillary: 119 mg/dL — ABNORMAL HIGH (ref 70–99)

## 2021-03-12 NOTE — Progress Notes (Signed)
PROGRESS NOTE    [name] Herring   OAC:[id number]  DOB: Apr 16, 1961  PCP: Sonia Side., FNP    DOA: 03/08/2021 LOS: 4   Assessment & Plan   Principal Problem:   Dehydration Active Problems:   Human immunodeficiency virus (HIV) disease ([place])   Hypothyroidism   Depression   Alcohol abuse   HTN (hypertension)   Tobacco abuse   Emphysema lung (HCC)   AKI (acute kidney injury) ([place])   History of pulmonary embolism   Schizophrenia (HCC)   Hypernatremia   Rhabdomyolysis   Protein-calorie malnutrition, severe   Dehydration / Hypernatremia / Rhabdomyolysis - all POA. Pt reported very little PO intake x 2 weeks prior to admission. Given bolus IV fluids in ED. CK trending down; renal function improving --continue IV hydration --monitor BMP's --Seroquel and statin on hold with rhabdo --Monitor liver/renal function and electrolytes --Encourage PO intake --Dietician consulted  AKI - due to above, pre-renal azotemia.  Improving. --continue IV hydration  Epigastric pain / ?Hx of gastric ulcer Chest/epigastric pain AM of 6/16 relieved by GI cocktail. No EKG changes to suggest acute ischemia. --Pepcid qHS  Severe protein calorie malnutrition - with temporal waisting, Body mass index is 18.17 kg/m.  Albumin 2.6.  Due to inadequate caloric intake and possibly HIV. --Dietician following, appreciate recs  Poor Living Situation - hx provided, pt was living in squalor and not caring for himself or with anyone helping him. --TOC consulted --Daughter hopes to move pt closer to her in [place]  General debility / Generalized Weakness / Ambulator Dysfunction --PT and OT recommending SNF --TOC following for placement  Hx of hypertension: normotensive to soft BP with home meds held.  Med hx shows regimen is amlodipine 10 mg daily, clonidine 0.2 mg TID, lisinopril 40 mg daily, Lopressor 25 mg BID. 6/17: BP's running high, resume some meds --Start losartan in place of  lisinopril --Later today added back amlodipine --Suspect has not been taking --Monitor BP closely, watch for rebound HTN   Hx of HIV - on Biktarvy - initially held due to renal function, resumed 6/15.  Hypothyroidism - Not on medication.  TSH normal at 0.688.  COPD/emphysema -stable, not exacerbated.  Continue home inhaler regimen and as needed albuterol  History of PE/DVT -continue Eliquis  Depression -continue home medication  History of alcohol abuse -patient reported not drinking alcohol in 2 months.  Started on thiamine, multivitamin and folic acid on admission.  No signs of withdrawal so far.  On renally dosed gabapentin, continue for now.  Monitor closely.  Tobacco abuse -nicotine patch ordered  Thrombocytosis -likely reactive, history of having thrombocytopenia.  Monitor CBC.  Outpatient follow-up if needed.  Macrocytosis -likely due to chronic alcohol consumption.  Follow-up B12 and folate  Schizophrenia - appears clinically stable.  Continue all other home medications except for Seroquel given elevated CK, resume once CK is improved.    Patient BMI: Body mass index is 18.17 kg/m.   DVT prophylaxis:  apixaban (ELIQUIS) tablet 5 mg   Diet:  Diet Orders (From admission, onward)     Start     Ordered   03/08/21 1421  Diet regular Room service appropriate? Yes; Fluid consistency: Thin  Diet effective now       Question Answer Comment  Room service appropriate? Yes   Fluid consistency: Thin      03/08/21 1423              Code Status: Full Code   Brief  Narrative / Hospital Course to Date:   [name] is a 60 y.o. male with medical history significant of HIV, HTN, hx of PE/DVT on eliquis and IVC filter placement in 2018, emphysema, depression, personality disorder,  tobacco abuse (1/2 PPD), schizophrenia, alcohol abuse (has not had a drink in 2 months) and gastric ucler he thinks 3 years ago who presents to ER after fall x 3 days ago due to an episode of  dizziness.  See full H&P for further details.  Patient's daughter lives in [place] and she came to check on him after not hearing from him in 2 weeks and found bugs everywhere and called EMS, contacted patient's Education officer, museum.  Patient is daughter reports ports she thought an aunt who lives locally had been checking in on patient.   [facility] at [facility] for worsening comman auditory hallucination and suicidal ideations -patient denied these complaints on admission.    Evaluation in the ED consistent with dehydration including acute kidney injury, hypernatremia and rhabdomyolysis.  These are being treated with IV hydration.  TOC, PT, OT are consulted for disposition planning.    Subjective 03/12/21    Patient was sleeping comfortably, awoke easily to voice.  Denies any acute complaints including pain, nausea vomiting.  Says the chest epigastric pain is better with adding Protonix.  No other acute complaints.   Disposition Plan & Communication   Status is: Inpatient  Remains inpatient appropriate because:IV treatments appropriate due to intensity of illness or inability to take PO  Dispo: The patient is from: Home              Anticipated d/c is to: SNF              Patient currently is not medically stable to d/c.   Difficult to place patient TBD   Family Communication: None present  Consults, Procedures, Significant Events   Consultants:  None  Procedures:  None  Antimicrobials:  Anti-infectives (From admission, onward)    Start     Dose/Rate Route Frequency Ordered Stop   03/09/21 1200  bictegravir-emtricitabine-tenofovir AF (BIKTARVY) 50-200-25 MG per tablet 1 tablet        1 tablet Oral Daily 03/09/21 1057           Micro    Objective   Vitals:   03/11/21 2224 03/12/21 0423 03/12/21 0755 03/12/21 1128  BP: (!) 139/93 (!) 144/94  (!) 138/91  Pulse: 74 60  70  Resp: 15 17 16 18   Temp: 98.6 F (37 C) 98.2 F (36.8 C)  98.7 F (37.1 C)   TempSrc: Oral Oral  Oral  SpO2: 95% 98%  97%  Weight:      Height:        Intake/Output Summary (Last 24 hours) at 03/12/2021 1751 Last data filed at 03/12/2021 1424 Gross per 24 hour  Intake 960 ml  Output 1950 ml  Net -990 ml   Filed Weights   03/08/21 1038 03/08/21 2220  Weight: 58 kg 54.2 kg    Physical Exam:  General exam: Sleeping comfortably, awoke easily to voice, no acute distress, cachectic Respiratory system: normal respiratory effort, on room air. Cardiovascular system: RRR, no pedal edema.   Gastrointestinal system: Soft nondistended Central nervous system: Alert, oriented x3, no gross focal deficits, normal speech  Labs   Data Reviewed: I have personally reviewed following labs and imaging studies  CBC: Recent Labs  Lab 03/08/21 1050 03/08/21 1100 03/09/21 0559 03/10/21 0345  WBC  6.8  --  6.1 4.7  NEUTROABS 5.2  --   --   --   HGB 15.5 15.3 12.7* 12.1*  HCT 48.4 45.0 38.6* 36.5*  MCV 106.6*  --  104.3* 102.8*  PLT 126*  --  PLATELET CLUMPS NOTED ON SMEAR, UNABLE TO ESTIMATE 81*   Basic Metabolic Panel: Recent Labs  Lab 03/08/21 1651 03/08/21 2322 03/09/21 0559 03/10/21 0345 03/11/21 0337 03/12/21 0633  NA 152* 147* 146* 145 143 141  K 4.3 3.9 3.9 3.6 3.5 3.7  CL 115* 113* 115* 114* 112* 110  CO2 25 27 26 26 25 25   GLUCOSE 109* 124* 97 88 94 89  BUN 56* 47* 40* 27* 14 17  CREATININE 2.17* 1.91* 1.55* 1.33* 0.98 0.95  CALCIUM 10.2 9.6 9.3 8.9 8.9 9.3  MG 2.9*  --   --   --  1.9  --   PHOS  --   --   --   --  2.7  --    GFR: Estimated Creatinine Clearance: 64.2 mL/min (by C-G formula based on SCr of 0.95 mg/dL). Liver Function Tests: Recent Labs  Lab 03/08/21 1651 03/09/21 0559 03/09/21 1635 03/10/21 0345 03/12/21 0633  AST 83* 63* 54* 43* 24  ALT 37 33 33 29 30  ALKPHOS 49 41 41 43 46  BILITOT 0.9 0.6 0.6 0.3 0.6  PROT 7.3 5.7* 5.5* 5.0* 5.3*  ALBUMIN 4.0 3.0* 2.9* 2.6* 2.7*   No results for input(s): LIPASE, AMYLASE in the  last 168 hours. No results for input(s): AMMONIA in the last 168 hours. Coagulation Profile: Recent Labs  Lab 03/08/21 1050  INR 1.2   Cardiac Enzymes: Recent Labs  Lab 03/08/21 1651 03/09/21 0559 03/09/21 1635 03/10/21 0345 03/12/21 0633  CKTOTAL 1,979* 1,204* 814* 533* 135   BNP (last 3 results) No results for input(s): PROBNP in the last 8760 hours. HbA1C: No results for input(s): HGBA1C in the last 72 hours. CBG: Recent Labs  Lab 03/12/21 1700  GLUCAP 119*   Lipid Profile: No results for input(s): CHOL, HDL, LDLCALC, TRIG, CHOLHDL, LDLDIRECT in the last 72 hours. Thyroid Function Tests: No results for input(s): TSH, T4TOTAL, FREET4, T3FREE, THYROIDAB in the last 72 hours.  Anemia Panel: No results for input(s): VITAMINB12, FOLATE, FERRITIN, TIBC, IRON, RETICCTPCT in the last 72 hours.  Sepsis Labs: Recent Labs  Lab 03/08/21 1050  LATICACIDVEN 1.6    Recent Results (from the past 240 hour(s))  Urine culture     Status: Abnormal   Collection Time: 03/08/21 10:40 AM   Specimen: In/Out Cath Urine  Result Value Ref Range Status   Specimen Description IN/OUT CATH URINE  Final   Special Requests   Final    NONE Performed at [facility], [address], [place], [place] [address]    Culture MULTIPLE SPECIES PRESENT, SUGGEST RECOLLECTION (A)  Final   Report Status 03/09/2021 FINAL  Final  Blood Culture (routine x 2)     Status: None (Preliminary result)   Collection Time: 03/08/21 10:50 AM   Specimen: BLOOD LEFT FOREARM  Result Value Ref Range Status   Specimen Description BLOOD LEFT FOREARM  Final   Special Requests   Final    BOTTLES DRAWN AEROBIC AND ANAEROBIC Blood Culture adequate volume   Culture   Final    NO GROWTH 3 DAYS Performed at [facility], [place] [address] [place], [place] [address]    Report Status PENDING  Incomplete  Blood Culture (  routine x 2)     Status: None (Preliminary result)   Collection Time: 03/08/21 10:51 AM    Specimen: BLOOD  Result Value Ref Range Status   Specimen Description BLOOD SITE NOT SPECIFIED  Final   Special Requests   Final    BOTTLES DRAWN AEROBIC AND ANAEROBIC Blood Culture adequate volume   Culture   Final    NO GROWTH 3 DAYS Performed at [facility], [address] [place], [place] [address]    Report Status PENDING  Incomplete  Resp Panel by RT-PCR (Flu A&B, Covid) Nasopharyngeal Swab     Status: None   Collection Time: 03/08/21 12:13 PM   Specimen: Nasopharyngeal Swab; Nasopharyngeal(NP) swabs in vial transport medium  Result Value Ref Range Status   SARS Coronavirus 2 by RT PCR NEGATIVE NEGATIVE Final    Comment: (NOTE) SARS-CoV-2 target nucleic acids are NOT DETECTED.  The SARS-CoV-2 RNA is generally detectable in upper respiratory specimens during the acute phase of infection. The lowest concentration of SARS-CoV-2 viral copies this assay can detect is 138 copies/mL. A negative result does not preclude SARS-Cov-2 infection and should not be used as the sole basis for treatment or other patient management decisions. A negative result may occur with  improper specimen collection/handling, submission of specimen other than nasopharyngeal swab, presence of viral mutation(s) within the areas targeted by this assay, and inadequate number of viral copies(<138 copies/mL). A negative result must be combined with clinical observations, patient history, and epidemiological information. The expected result is Negative.  Fact Sheet for Patients:  EntrepreneurPulse.com.au  Fact Sheet for Healthcare Providers:  IncredibleEmployment.be  This test is no t yet approved or cleared by the [place] FDA and  has been authorized for detection and/or diagnosis of SARS-CoV-2 by FDA under an Emergency Use Authorization (EUA). This EUA will remain  in effect (meaning this test can be used) for the duration of the COVID-19 declaration under  Section 564(b)(1) of the Act, 21 U.S.C.section 360bbb-3(b)(1), unless the authorization is terminated  or revoked sooner.       Influenza A by PCR NEGATIVE NEGATIVE Final   Influenza B by PCR NEGATIVE NEGATIVE Final    Comment: (NOTE) The Xpert Xpress SARS-CoV-2/FLU/RSV plus assay is intended as an aid in the diagnosis of influenza from Nasopharyngeal swab specimens and should not be used as a sole basis for treatment. Nasal washings and aspirates are unacceptable for Xpert Xpress SARS-CoV-2/FLU/RSV testing.  Fact Sheet for Patients: EntrepreneurPulse.com.au  Fact Sheet for Healthcare Providers: IncredibleEmployment.be  This test is not yet approved or cleared by the [place] FDA and has been authorized for detection and/or diagnosis of SARS-CoV-2 by FDA under an Emergency Use Authorization (EUA). This EUA will remain in effect (meaning this test can be used) for the duration of the COVID-19 declaration under Section 564(b)(1) of the Act, 21 U.S.C. section 360bbb-3(b)(1), unless the authorization is terminated or revoked.  Performed at [facility], [place] [address], [place], [place] [address]       Imaging Studies   No results found.   Medications   Scheduled Meds:  amLODipine  5 mg Oral Daily   apixaban  5 mg Oral BID   atorvastatin  40 mg Oral QHS   benztropine  1 mg Oral BID   bictegravir-emtricitabine-tenofovir AF  1 tablet Oral Daily   famotidine  20 mg Oral QHS   feeding supplement  237 mL Oral TID BM   folic acid  1  mg Oral Daily   gabapentin  600 mg Oral QHS   losartan  25 mg Oral Daily   mirtazapine  15 mg Oral QHS   mometasone-formoterol  2 puff Inhalation BID   montelukast  10 mg Oral Daily   multivitamin with minerals  1 tablet Oral Daily   mupirocin ointment   Topical BID   nicotine  14 mg Transdermal Daily   pantoprazole  40 mg Oral Daily   QUEtiapine  25 mg Oral QHS   risperiDONE  2 mg Oral BID    thiamine  100 mg Oral Daily   valbenazine  80 mg Oral QHS   Continuous Infusions:       LOS: 4 days    Time spent: 20 minutes    Ezekiel Slocumb, DO [facility]  03/12/2021, 5:51 PM      If 7PM-7AM, please contact night-coverage. How to contact the [facility] Attending or Consulting provider [place] or covering provider during after hours [place], for this patient?    Check the care team in [facility] and look for a) attending/consulting [facility] provider listed and b) the [facility] team listed Log into [email or web address] and use [place]'s universal password to access. If you do not have the password, please contact the hospital operator. Locate the [facility] provider you are looking for under [facility] and page to a number that you can be directly reached. If you still have difficulty reaching the provider, please page the [facility] (Director on Call) for the Hospitalists listed on amion for assistance.

## 2021-03-13 DIAGNOSIS — E43 Unspecified severe protein-calorie malnutrition: Secondary | ICD-10-CM

## 2021-03-13 DIAGNOSIS — R627 Adult failure to thrive: Secondary | ICD-10-CM

## 2021-03-13 LAB — FERRITIN: Ferritin: 61 ng/mL (ref 24–336)

## 2021-03-13 LAB — CULTURE, BLOOD (ROUTINE X 2)
Culture: NO GROWTH
Culture: NO GROWTH
Special Requests: ADEQUATE
Special Requests: ADEQUATE

## 2021-03-13 LAB — IRON AND TIBC
Iron: 67 ug/dL (ref 45–182)
Saturation Ratios: 26 % (ref 17.9–39.5)
TIBC: 255 ug/dL (ref 250–450)
UIBC: 188 ug/dL

## 2021-03-13 MED ORDER — PANTOPRAZOLE SODIUM 40 MG PO TBEC
40.0000 mg | DELAYED_RELEASE_TABLET | Freq: Two times a day (BID) | ORAL | Status: DC
  Administered 2021-03-13 – 2021-03-18 (×10): 40 mg via ORAL
  Filled 2021-03-13 (×9): qty 1

## 2021-03-13 MED ORDER — CALCIUM CARBONATE ANTACID 500 MG PO CHEW
2.0000 | CHEWABLE_TABLET | Freq: Three times a day (TID) | ORAL | Status: DC | PRN
  Filled 2021-03-13: qty 2

## 2021-03-13 MED ORDER — SUCRALFATE 1 GM/10ML PO SUSP
1.0000 g | Freq: Two times a day (BID) | ORAL | Status: DC
  Administered 2021-03-13 – 2021-03-18 (×10): 1 g via ORAL
  Filled 2021-03-13 (×11): qty 10

## 2021-03-13 NOTE — Progress Notes (Addendum)
PROGRESS NOTE    [name] Herring   OIN:[id number]  DOB: June 16, 1961  PCP: Sonia Side., FNP    DOA: 03/08/2021 LOS: 5   Assessment & Plan   Principal Problem:   Dehydration Active Problems:   Human immunodeficiency virus (HIV) disease ([place])   Hypothyroidism   Depression   Alcohol abuse   HTN (hypertension)   Tobacco abuse   Emphysema lung (HCC)   AKI (acute kidney injury) ([place])   History of pulmonary embolism   Schizophrenia (HCC)   Hypernatremia   Rhabdomyolysis   Protein-calorie malnutrition, severe   Dehydration / Hypernatremia / Rhabdomyolysis - all POA. Pt reported very little PO intake x 2 weeks prior to admission.  Given bolus IV fluids in ED. CK and renal function normalized --monitor BMP's --Seroquel and statin on hold with rhabdo --Monitor liver/renal function and electrolytes --Encourage PO intake --Dietician consulted  AKI - due to above, pre-renal azotemia.  Improving. --off IV hydration  Epigastric pain / ?Hx of gastric ulcer Chest/epigastric pain AM of 6/16 relieved by GI cocktail. No EKG changes to suggest acute ischemia. --Started Pepcid qHS --Added Protonix qAM --Will make Protonix BID and d/c Pepcid --Tums PRN --Trial Carafate   Severe protein calorie malnutrition - with temporal waisting, Body mass index is 18.17 kg/m.  Albumin 2.6.  Due to inadequate caloric intake and possibly HIV. --Dietician following, appreciate recs  Poor Living Situation - hx provided, pt was living in squalor and not caring for himself or with anyone helping him. --TOC consulted --Daughter hopes to move pt closer to her in [place]  General debility / Generalized Weakness / Ambulator Dysfunction --PT and OT recommending SNF --TOC following for placement  Hx of hypertension: normotensive to soft BP with home meds held.  Med hx shows regimen is amlodipine 10 mg daily, clonidine 0.2 mg TID, lisinopril 40 mg daily, Lopressor 25 mg BID. 6/17: BP's running  high, resume some meds --Start losartan in place of lisinopril --Later today added back amlodipine --Suspect has not been taking --Monitor BP closely, watch for rebound HTN   Hx of HIV - on Biktarvy - initially held due to renal function, resumed 6/15.  Hypothyroidism - Not on medication.  TSH normal at 0.688.  COPD/emphysema -stable, not exacerbated.  Continue home inhaler regimen and as needed albuterol  History of PE/DVT -continue Eliquis  Depression -continue home medication  History of alcohol abuse -patient reported not drinking alcohol in 2 months.  Started on thiamine, multivitamin and folic acid on admission.  No signs of withdrawal so far.  On renally dosed gabapentin, continue for now.  Monitor closely.  Tobacco abuse -nicotine patch ordered  Thrombocytosis -likely reactive, history of having thrombocytopenia.  Monitor CBC.  Outpatient follow-up if needed.  Macrocytosis -likely due to chronic alcohol consumption.  Follow-up B12 and folate  Schizophrenia - appears clinically stable.  Continue all other home medications except for Seroquel given elevated CK, resume once CK is improved.    Patient BMI: Body mass index is 18.17 kg/m.   DVT prophylaxis:  apixaban (ELIQUIS) tablet 5 mg   Diet:  Diet Orders (From admission, onward)     Start     Ordered   03/08/21 1421  Diet regular Room service appropriate? Yes; Fluid consistency: Thin  Diet effective now       Question Answer Comment  Room service appropriate? Yes   Fluid consistency: Thin      03/08/21 1423  Code Status: Full Code   Brief Narrative / Hospital Course to Date:   [name] Herring is a 60 y.o. male with medical history significant of HIV, HTN, hx of PE/DVT on eliquis and IVC filter placement in 2018, emphysema, depression, personality disorder,  tobacco abuse (1/2 PPD), schizophrenia, alcohol abuse (has not had a drink in 2 months) and gastric ucler he thinks 3 years ago who  presents to ER after fall x 3 days ago due to an episode of dizziness.  See full H&P for further details.  Patient's daughter lives in [place] and she came to check on him after not hearing from him in 2 weeks and found bugs everywhere and called EMS, contacted patient's Education officer, museum.  Patient is daughter reports ports she thought an aunt who lives locally had been checking in on patient.   Recently admitted at behavior health for worsening comman auditory hallucination and suicidal ideations -patient denied these complaints on admission.    Evaluation in the ED consistent with dehydration including acute kidney injury, hypernatremia and rhabdomyolysis.  These are being treated with IV hydration.  TOC, PT, OT are consulted for disposition planning.    Subjective 03/13/21    Patient says having some epigastric discomfort still but little better with meds we started.  Later AM had more heartburn Tums helped.  No other acute complaints.  He asked if PT rehab will be outpatient.   Disposition Plan & Communication   Status is: Inpatient  Remains inpatient appropriate because: Unsafe d/c.  Pt requires SNF placement for short term rehab, is unsafe to return to his prior place of residence.  Dispo: The patient is from: Home              Anticipated d/c is to: SNF              Patient currently IS medically stable for d/c.   Difficult to place patient TBD   Family Communication: None present  Consults, Procedures, Significant Events   Consultants:  None  Procedures:  None  Antimicrobials:  Anti-infectives (From admission, onward)    Start     Dose/Rate Route Frequency Ordered Stop   03/09/21 1200  bictegravir-emtricitabine-tenofovir AF (BIKTARVY) 50-200-25 MG per tablet 1 tablet        1 tablet Oral Daily 03/09/21 1057           Micro    Objective   Vitals:   03/12/21 2339 03/13/21 0550 03/13/21 0833 03/13/21 1211  BP: 138/89 (!) 131/100  (!) 138/92  Pulse: 68 (!)  50  89  Resp: 18 18  16   Temp: 98.1 F (36.7 C) 98.1 F (36.7 C)  97.7 F (36.5 C)  TempSrc: Oral Oral  Oral  SpO2: 97% 94% 92% 96%  Weight:      Height:        Intake/Output Summary (Last 24 hours) at 03/13/2021 1400 Last data filed at 03/12/2021 1818 Gross per 24 hour  Intake 480 ml  Output 700 ml  Net -220 ml   Filed Weights   03/08/21 1038 03/08/21 2220  Weight: 58 kg 54.2 kg    Physical Exam:  General exam: awake laying in bed, no acute distress, cachectic Respiratory system: normal respiratory effort, on room air. Cardiovascular system: RRR, no pedal edema.   Gastrointestinal system: Soft nondistended and nontender Central nervous system: Alert, oriented x3, no gross focal deficits, normal speech  Labs   Data Reviewed: I have personally reviewed following labs and  imaging studies  CBC: Recent Labs  Lab 03/08/21 1050 03/08/21 1100 03/09/21 0559 03/10/21 0345  WBC 6.8  --  6.1 4.7  NEUTROABS 5.2  --   --   --   HGB 15.5 15.3 12.7* 12.1*  HCT 48.4 45.0 38.6* 36.5*  MCV 106.6*  --  104.3* 102.8*  PLT 126*  --  PLATELET CLUMPS NOTED ON SMEAR, UNABLE TO ESTIMATE 81*   Basic Metabolic Panel: Recent Labs  Lab 03/08/21 1651 03/08/21 2322 03/09/21 0559 03/10/21 0345 03/11/21 0337 03/12/21 0633  NA 152* 147* 146* 145 143 141  K 4.3 3.9 3.9 3.6 3.5 3.7  CL 115* 113* 115* 114* 112* 110  CO2 25 27 26 26 25 25   GLUCOSE 109* 124* 97 88 94 89  BUN 56* 47* 40* 27* 14 17  CREATININE 2.17* 1.91* 1.55* 1.33* 0.98 0.95  CALCIUM 10.2 9.6 9.3 8.9 8.9 9.3  MG 2.9*  --   --   --  1.9  --   PHOS  --   --   --   --  2.7  --    GFR: Estimated Creatinine Clearance: 64.2 mL/min (by C-G formula based on SCr of 0.95 mg/dL). Liver Function Tests: Recent Labs  Lab 03/08/21 1651 03/09/21 0559 03/09/21 1635 03/10/21 0345 03/12/21 0633  AST 83* 63* 54* 43* 24  ALT 37 33 33 29 30  ALKPHOS 49 41 41 43 46  BILITOT 0.9 0.6 0.6 0.3 0.6  PROT 7.3 5.7* 5.5* 5.0* 5.3*   ALBUMIN 4.0 3.0* 2.9* 2.6* 2.7*   No results for input(s): LIPASE, AMYLASE in the last 168 hours. No results for input(s): AMMONIA in the last 168 hours. Coagulation Profile: Recent Labs  Lab 03/08/21 1050  INR 1.2   Cardiac Enzymes: Recent Labs  Lab 03/08/21 1651 03/09/21 0559 03/09/21 1635 03/10/21 0345 03/12/21 0633  CKTOTAL 1,979* 1,204* 814* 533* 135   BNP (last 3 results) No results for input(s): PROBNP in the last 8760 hours. HbA1C: No results for input(s): HGBA1C in the last 72 hours. CBG: Recent Labs  Lab 03/12/21 1700  GLUCAP 119*   Lipid Profile: No results for input(s): CHOL, HDL, LDLCALC, TRIG, CHOLHDL, LDLDIRECT in the last 72 hours. Thyroid Function Tests: No results for input(s): TSH, T4TOTAL, FREET4, T3FREE, THYROIDAB in the last 72 hours.  Anemia Panel: Recent Labs    03/13/21 0943  FERRITIN 61  TIBC 255  IRON 67    Sepsis Labs: Recent Labs  Lab 03/08/21 1050  LATICACIDVEN 1.6    Recent Results (from the past 240 hour(s))  Urine culture     Status: Abnormal   Collection Time: 03/08/21 10:40 AM   Specimen: In/Out Cath Urine  Result Value Ref Range Status   Specimen Description IN/OUT CATH URINE  Final   Special Requests   Final    NONE Performed at [facility], [place] [address], [place], [place] [address]    Culture MULTIPLE SPECIES PRESENT, SUGGEST RECOLLECTION (A)  Final   Report Status 03/09/2021 FINAL  Final  Blood Culture (routine x 2)     Status: None   Collection Time: 03/08/21 10:50 AM   Specimen: BLOOD LEFT FOREARM  Result Value Ref Range Status   Specimen Description BLOOD LEFT FOREARM  Final   Special Requests   Final    BOTTLES DRAWN AEROBIC AND ANAEROBIC Blood Culture adequate volume   Culture   Final    NO GROWTH 5 DAYS Performed at [facility]  Lab, [address], [place], [place] [address]    Report Status 03/13/2021 FINAL  Final  Blood Culture (routine x 2)     Status: None   Collection Time:  03/08/21 10:51 AM   Specimen: BLOOD  Result Value Ref Range Status   Specimen Description BLOOD SITE NOT SPECIFIED  Final   Special Requests   Final    BOTTLES DRAWN AEROBIC AND ANAEROBIC Blood Culture adequate volume   Culture   Final    NO GROWTH 5 DAYS Performed at [facility], [place] [address], [place], [place] [address]    Report Status 03/13/2021 FINAL  Final  Resp Panel by RT-PCR (Flu A&B, Covid) Nasopharyngeal Swab     Status: None   Collection Time: 03/08/21 12:13 PM   Specimen: Nasopharyngeal Swab; Nasopharyngeal(NP) swabs in vial transport medium  Result Value Ref Range Status   SARS Coronavirus 2 by RT PCR NEGATIVE NEGATIVE Final    Comment: (NOTE) SARS-CoV-2 target nucleic acids are NOT DETECTED.  The SARS-CoV-2 RNA is generally detectable in upper respiratory specimens during the acute phase of infection. The lowest concentration of SARS-CoV-2 viral copies this assay can detect is 138 copies/mL. A negative result does not preclude SARS-Cov-2 infection and should not be used as the sole basis for treatment or other patient management decisions. A negative result may occur with  improper specimen collection/handling, submission of specimen other than nasopharyngeal swab, presence of viral mutation(s) within the areas targeted by this assay, and inadequate number of viral copies(<138 copies/mL). A negative result must be combined with clinical observations, patient history, and epidemiological information. The expected result is Negative.  Fact Sheet for Patients:  EntrepreneurPulse.com.au  Fact Sheet for Healthcare Providers:  IncredibleEmployment.be  This test is no t yet approved or cleared by the [place] FDA and  has been authorized for detection and/or diagnosis of SARS-CoV-2 by FDA under an Emergency Use Authorization (EUA). This EUA will remain  in effect (meaning this test can be used) for the duration of  the COVID-19 declaration under Section 564(b)(1) of the Act, 21 U.S.C.section 360bbb-3(b)(1), unless the authorization is terminated  or revoked sooner.       Influenza A by PCR NEGATIVE NEGATIVE Final   Influenza B by PCR NEGATIVE NEGATIVE Final    Comment: (NOTE) The Xpert Xpress SARS-CoV-2/FLU/RSV plus assay is intended as an aid in the diagnosis of influenza from Nasopharyngeal swab specimens and should not be used as a sole basis for treatment. Nasal washings and aspirates are unacceptable for Xpert Xpress SARS-CoV-2/FLU/RSV testing.  Fact Sheet for Patients: EntrepreneurPulse.com.au  Fact Sheet for Healthcare Providers: IncredibleEmployment.be  This test is not yet approved or cleared by the [place] FDA and has been authorized for detection and/or diagnosis of SARS-CoV-2 by FDA under an Emergency Use Authorization (EUA). This EUA will remain in effect (meaning this test can be used) for the duration of the COVID-19 declaration under Section 564(b)(1) of the Act, 21 U.S.C. section 360bbb-3(b)(1), unless the authorization is terminated or revoked.  Performed at [facility], [place] [address], [place], [place] [address]       Imaging Studies   No results found.   Medications   Scheduled Meds:  amLODipine  5 mg Oral Daily   apixaban  5 mg Oral BID   atorvastatin  40 mg Oral QHS   benztropine  1 mg Oral BID   bictegravir-emtricitabine-tenofovir AF  1 tablet Oral Daily   famotidine  20  mg Oral QHS   feeding supplement  237 mL Oral TID BM   folic acid  1 mg Oral Daily   gabapentin  600 mg Oral QHS   losartan  25 mg Oral Daily   mirtazapine  15 mg Oral QHS   mometasone-formoterol  2 puff Inhalation BID   montelukast  10 mg Oral Daily   multivitamin with minerals  1 tablet Oral Daily   mupirocin ointment   Topical BID   nicotine  14 mg Transdermal Daily   pantoprazole  40 mg Oral BID   QUEtiapine  25 mg Oral QHS    risperiDONE  2 mg Oral BID   sucralfate  1 g Oral BID AC   thiamine  100 mg Oral Daily   valbenazine  80 mg Oral QHS   Continuous Infusions:       LOS: 5 days    Time spent: 25 minutes with > 50% spent at bedside and in coordination of care.    Ezekiel Slocumb, DO [facility]  03/13/2021, 2:00 PM      If 7PM-7AM, please contact night-coverage. How to contact the [facility] Attending or Consulting provider [place] or covering provider during after hours [place], for this patient?    Check the care team in [facility] and look for a) attending/consulting [facility] provider listed and b) the [facility] team listed Log into [email or web address] and use [place]'s universal password to access. If you do not have the password, please contact the hospital operator. Locate the [facility] provider you are looking for under [facility] and page to a number that you can be directly reached. If you still have difficulty reaching the provider, please page the [facility] (Director on Call) for the Hospitalists listed on amion for assistance.

## 2021-03-14 NOTE — Progress Notes (Signed)
PROGRESS NOTE    [name] Herring   GNF:[id number]  DOB: 1961-08-31  PCP: Sonia Side., FNP    DOA: 03/08/2021 LOS: 6   Assessment & Plan   Principal Problem:   Dehydration Active Problems:   Human immunodeficiency virus (HIV) disease ([place])   Hypothyroidism   Depression   Alcohol abuse   HTN (hypertension)   Tobacco abuse   Emphysema lung (HCC)   AKI (acute kidney injury) ([place])   History of pulmonary embolism   Schizophrenia (HCC)   Hypernatremia   Rhabdomyolysis   Protein-calorie malnutrition, severe   Failure to thrive in adult   Dehydration / Hypernatremia / Rhabdomyolysis - all POA. Pt reported very little PO intake x 2 weeks prior to admission.  Given bolus IV fluids in ED. CK and renal function normalized --monitor BMP's --Monitor liver/renal function and electrolytes --Encourage PO intake --Dietician consulted  AKI - due to above, pre-renal azotemia.  Resolved. --off IV hydration  Epigastric pain / ?Hx of gastric ulcer Chest/epigastric pain AM of 6/16 relieved by GI cocktail. No EKG changes to suggest acute ischemia. --Tried Pepcid qHS, added Protonix qAM, still symptomatic.  Increased PPI to BID and added Carafate - Improved. --Continue Protonix 40 mg PO BID  --Continue Carafate --Tums PRN  Severe protein calorie malnutrition - with temporal waisting, Body mass index is 18.17 kg/m.  Albumin 2.6.  Due to inadequate caloric intake and possibly HIV. --Dietician following, appreciate recs  Poor Living Situation - hx provided, pt was living in squalor and not caring for himself or with anyone helping him. --TOC consulted --Daughter hopes to move pt closer to her in [place]  General debility / Generalized Weakness / Ambulatory Dysfunction --PT and OT recommending SNF --TOC following for placement  Hx of hypertension: normotensive to soft BP with home meds held.  Med hx shows regimen is amlodipine 10 mg daily, clonidine 0.2 mg TID, lisinopril 40  mg daily, Lopressor 25 mg BID. 6/17: BP's running high, resume some meds --Started losartan in place of lisinopril --Continue amlodipine --Suspect has not been taking meds --Monitor BP closely, watch for rebound HTN (none seen)  Hx of HIV - on Biktarvy - initially held due to renal function, resumed 6/15.  Hypothyroidism - Not on medication.   TSH normal at 0.688.  Follow up outpatient.  COPD/emphysema -stable, not exacerbated.  Continue home inhaler regimen and as needed albuterol  History of PE/DVT -continue Eliquis  Depression -continue home medication  History of alcohol abuse -patient reported not drinking alcohol in 2 months.  Started on thiamine, multivitamin and folic acid on admission.  No signs of withdrawal so far.  On gabapentin, continue for now.  Monitor closely.  Tobacco abuse -nicotine patch ordered  Thrombocytopenia - suspect underlying liver dz.  Not on any heparin products or antiplatelet agents.  Monitor.  Macrocytosis -likely due to chronic alcohol consumption.   Folate within normal, vit B12 elevated.  Schizophrenia - appears clinically stable.   --Continue all other home medications --Seroquel was initially held given elevated CK - resumed   Patient BMI: Body mass index is 18.17 kg/m.   DVT prophylaxis:  apixaban (ELIQUIS) tablet 5 mg   Diet:  Diet Orders (From admission, onward)     Start     Ordered   03/08/21 1421  Diet regular Room service appropriate? Yes; Fluid consistency: Thin  Diet effective now       Question Answer Comment  Room service appropriate? Yes   Fluid  consistency: Thin      03/08/21 1423              Code Status: Full Code   Brief Narrative / Hospital Course to Date:   [name] Herring is a 60 y.o. male with medical history significant of HIV, HTN, hx of PE/DVT on eliquis and IVC filter placement in 2018, emphysema, depression, personality disorder,  tobacco abuse (1/2 PPD), schizophrenia, alcohol abuse (has not had  a drink in 2 months) and gastric ucler he thinks 3 years ago who presents to ER after fall x 3 days ago due to an episode of dizziness.  See full H&P for further details.  Patient's daughter lives in [place] and she came to check on him after not hearing from him in 2 weeks and found bugs everywhere and called EMS, contacted patient's Education officer, museum.  Patient is daughter reports ports she thought an aunt who lives locally had been checking in on patient.   Recently admitted at behavior health for worsening comman auditory hallucination and suicidal ideations -patient denied these complaints on admission.    Evaluation in the ED consistent with dehydration including acute kidney injury, hypernatremia and rhabdomyolysis.  These are being treated with IV hydration.  TOC, PT, OT are consulted for disposition planning.    Subjective 03/14/21    Patient says his epigastric pain is better after making Protonix BID.  He says he is feeling well.  Denies any acute complaints.     Disposition Plan & Communication   Status is: Inpatient  Remains inpatient appropriate because: Unsafe d/c.  Pt requires SNF placement for short term rehab, is unsafe to return to his prior place of residence.  Dispo: The patient is from: Home              Anticipated d/c is to: SNF              Patient currently IS medically stable for d/c.   Difficult to place patient TBD   Family Communication: None present  Consults, Procedures, Significant Events   Consultants:  None  Procedures:  None  Antimicrobials:  Anti-infectives (From admission, onward)    Start     Dose/Rate Route Frequency Ordered Stop   03/09/21 1200  bictegravir-emtricitabine-tenofovir AF (BIKTARVY) 50-200-25 MG per tablet 1 tablet        1 tablet Oral Daily 03/09/21 1057           Micro    Objective   Vitals:   03/13/21 2239 03/14/21 0432 03/14/21 0747 03/14/21 1224  BP: (!) 155/89 114/79  123/73  Pulse: 82 60  70  Resp: 18  16  16   Temp: 98.5 F (36.9 C) 97.7 F (36.5 C)  97.9 F (36.6 C)  TempSrc: Oral Oral  Oral  SpO2: 96% 98% 93% 96%  Weight:      Height:        Intake/Output Summary (Last 24 hours) at 03/14/2021 1558 Last data filed at 03/14/2021 1400 Gross per 24 hour  Intake 480 ml  Output 650 ml  Net -170 ml   Filed Weights   03/08/21 1038 03/08/21 2220  Weight: 58 kg 54.2 kg    Physical Exam:  General exam: awake, alert no acute distress, cachectic, chronically ill appearing Respiratory system: normal respiratory effort, on room air. Gastrointestinal system: soft with no tenderness or disntention Central nervous system: Alert, oriented x3, no gross focal deficits, normal speech  Labs   Data Reviewed: I have personally  reviewed following labs and imaging studies  CBC: Recent Labs  Lab 03/08/21 1050 03/08/21 1100 03/09/21 0559 03/10/21 0345  WBC 6.8  --  6.1 4.7  NEUTROABS 5.2  --   --   --   HGB 15.5 15.3 12.7* 12.1*  HCT 48.4 45.0 38.6* 36.5*  MCV 106.6*  --  104.3* 102.8*  PLT 126*  --  PLATELET CLUMPS NOTED ON SMEAR, UNABLE TO ESTIMATE 81*   Basic Metabolic Panel: Recent Labs  Lab 03/08/21 1651 03/08/21 2322 03/09/21 0559 03/10/21 0345 03/11/21 0337 03/12/21 0633  NA 152* 147* 146* 145 143 141  K 4.3 3.9 3.9 3.6 3.5 3.7  CL 115* 113* 115* 114* 112* 110  CO2 25 27 26 26 25 25   GLUCOSE 109* 124* 97 88 94 89  BUN 56* 47* 40* 27* 14 17  CREATININE 2.17* 1.91* 1.55* 1.33* 0.98 0.95  CALCIUM 10.2 9.6 9.3 8.9 8.9 9.3  MG 2.9*  --   --   --  1.9  --   PHOS  --   --   --   --  2.7  --    GFR: Estimated Creatinine Clearance: 64.2 mL/min (by C-G formula based on SCr of 0.95 mg/dL). Liver Function Tests: Recent Labs  Lab 03/08/21 1651 03/09/21 0559 03/09/21 1635 03/10/21 0345 03/12/21 0633  AST 83* 63* 54* 43* 24  ALT 37 33 33 29 30  ALKPHOS 49 41 41 43 46  BILITOT 0.9 0.6 0.6 0.3 0.6  PROT 7.3 5.7* 5.5* 5.0* 5.3*  ALBUMIN 4.0 3.0* 2.9* 2.6* 2.7*   No  results for input(s): LIPASE, AMYLASE in the last 168 hours. No results for input(s): AMMONIA in the last 168 hours. Coagulation Profile: Recent Labs  Lab 03/08/21 1050  INR 1.2   Cardiac Enzymes: Recent Labs  Lab 03/08/21 1651 03/09/21 0559 03/09/21 1635 03/10/21 0345 03/12/21 0633  CKTOTAL 1,979* 1,204* 814* 533* 135   BNP (last 3 results) No results for input(s): PROBNP in the last 8760 hours. HbA1C: No results for input(s): HGBA1C in the last 72 hours. CBG: Recent Labs  Lab 03/12/21 1700  GLUCAP 119*   Lipid Profile: No results for input(s): CHOL, HDL, LDLCALC, TRIG, CHOLHDL, LDLDIRECT in the last 72 hours. Thyroid Function Tests: No results for input(s): TSH, T4TOTAL, FREET4, T3FREE, THYROIDAB in the last 72 hours.  Anemia Panel: Recent Labs    03/13/21 0943  FERRITIN 61  TIBC 255  IRON 67    Sepsis Labs: Recent Labs  Lab 03/08/21 1050  LATICACIDVEN 1.6    Recent Results (from the past 240 hour(s))  Urine culture     Status: Abnormal   Collection Time: 03/08/21 10:40 AM   Specimen: In/Out Cath Urine  Result Value Ref Range Status   Specimen Description IN/OUT CATH URINE  Final   Special Requests   Final    NONE Performed at [facility], [place] [address] [place], [place] [address]    Culture MULTIPLE SPECIES PRESENT, SUGGEST RECOLLECTION (A)  Final   Report Status 03/09/2021 FINAL  Final  Blood Culture (routine x 2)     Status: None   Collection Time: 03/08/21 10:50 AM   Specimen: BLOOD LEFT FOREARM  Result Value Ref Range Status   Specimen Description BLOOD LEFT FOREARM  Final   Special Requests   Final    BOTTLES DRAWN AEROBIC AND ANAEROBIC Blood Culture adequate volume   Culture   Final    NO GROWTH 5 DAYS Performed  at [facility], [place] [address] [place], [place] [address]    Report Status 03/13/2021 FINAL  Final  Blood Culture (routine x 2)     Status: None   Collection Time: 03/08/21 10:51 AM   Specimen: BLOOD  Result  Value Ref Range Status   Specimen Description BLOOD SITE NOT SPECIFIED  Final   Special Requests   Final    BOTTLES DRAWN AEROBIC AND ANAEROBIC Blood Culture adequate volume   Culture   Final    NO GROWTH 5 DAYS Performed at [facility], [place] [address] [place], [place] [address]    Report Status 03/13/2021 FINAL  Final  Resp Panel by RT-PCR (Flu A&B, Covid) Nasopharyngeal Swab     Status: None   Collection Time: 03/08/21 12:13 PM   Specimen: Nasopharyngeal Swab; Nasopharyngeal(NP) swabs in vial transport medium  Result Value Ref Range Status   SARS Coronavirus 2 by RT PCR NEGATIVE NEGATIVE Final    Comment: (NOTE) SARS-CoV-2 target nucleic acids are NOT DETECTED.  The SARS-CoV-2 RNA is generally detectable in upper respiratory specimens during the acute phase of infection. The lowest concentration of SARS-CoV-2 viral copies this assay can detect is 138 copies/mL. A negative result does not preclude SARS-Cov-2 infection and should not be used as the sole basis for treatment or other patient management decisions. A negative result may occur with  improper specimen collection/handling, submission of specimen other than nasopharyngeal swab, presence of viral mutation(s) within the areas targeted by this assay, and inadequate number of viral copies(<138 copies/mL). A negative result must be combined with clinical observations, patient history, and epidemiological information. The expected result is Negative.  Fact Sheet for Patients:  EntrepreneurPulse.com.au  Fact Sheet for Healthcare Providers:  IncredibleEmployment.be  This test is no t yet approved or cleared by the [place] FDA and  has been authorized for detection and/or diagnosis of SARS-CoV-2 by FDA under an Emergency Use Authorization (EUA). This EUA will remain  in effect (meaning this test can be used) for the duration of the COVID-19 declaration under Section 564(b)(1) of  the Act, 21 U.S.C.section 360bbb-3(b)(1), unless the authorization is terminated  or revoked sooner.       Influenza A by PCR NEGATIVE NEGATIVE Final   Influenza B by PCR NEGATIVE NEGATIVE Final    Comment: (NOTE) The Xpert Xpress SARS-CoV-2/FLU/RSV plus assay is intended as an aid in the diagnosis of influenza from Nasopharyngeal swab specimens and should not be used as a sole basis for treatment. Nasal washings and aspirates are unacceptable for Xpert Xpress SARS-CoV-2/FLU/RSV testing.  Fact Sheet for Patients: EntrepreneurPulse.com.au  Fact Sheet for Healthcare Providers: IncredibleEmployment.be  This test is not yet approved or cleared by the [place] FDA and has been authorized for detection and/or diagnosis of SARS-CoV-2 by FDA under an Emergency Use Authorization (EUA). This EUA will remain in effect (meaning this test can be used) for the duration of the COVID-19 declaration under Section 564(b)(1) of the Act, 21 U.S.C. section 360bbb-3(b)(1), unless the authorization is terminated or revoked.  Performed at [facility], [place] [address] [place], [place] [address]       Imaging Studies   No results found.   Medications   Scheduled Meds:  amLODipine  5 mg Oral Daily   apixaban  5 mg Oral BID   atorvastatin  40 mg Oral QHS   benztropine  1 mg Oral BID   bictegravir-emtricitabine-tenofovir AF  1 tablet Oral Daily  feeding supplement  237 mL Oral TID BM   folic acid  1 mg Oral Daily   gabapentin  600 mg Oral QHS   losartan  25 mg Oral Daily   mirtazapine  15 mg Oral QHS   mometasone-formoterol  2 puff Inhalation BID   montelukast  10 mg Oral Daily   multivitamin with minerals  1 tablet Oral Daily   mupirocin ointment   Topical BID   nicotine  14 mg Transdermal Daily   pantoprazole  40 mg Oral BID   QUEtiapine  25 mg Oral QHS   risperiDONE  2 mg Oral BID   sucralfate  1 g Oral BID AC   thiamine  100 mg Oral  Daily   valbenazine  80 mg Oral QHS   Continuous Infusions:       LOS: 6 days    Time spent: 20 minutes    Ezekiel Slocumb, DO [facility]  03/14/2021, 3:58 PM      If 7PM-7AM, please contact night-coverage. How to contact the [facility] Attending or Consulting provider [place] or covering provider during after hours [place], for this patient?    Check the care team in [facility] and look for a) attending/consulting TRH provider listed and b) the [facility] team listed Log into [email or web address] and use [place]'s universal password to access. If you do not have the password, please contact the hospital operator. Locate the [facility] provider you are looking for under [facility] and page to a number that you can be directly reached. If you still have difficulty reaching the provider, please page the [facility] (Director on Call) for the Hospitalists listed on amion for assistance.

## 2021-03-14 NOTE — Progress Notes (Signed)
Physical Therapy Treatment Patient Details Name: [name] Herring MRN: [id number] DOB: Mar 26, 1961 Today's Date: 03/14/2021    History of Present Illness Pt is a 60 year old man admitted 03/09/21 from home where he was living in squalor. + dehydration, rhamdomyolosis, AKI, hypernatremia. PMH: HIV, hypothyroidism, depression, alcohol and tobacco dependence, emphysema, personality disorder, schizophrenia.    PT Comments    Pt finishing up lunch on entry agreeable to working with therapy. Pt reports he would like to try to walk without AD. Pt is min guard for transfers and min guard progressing to minA for ambulation. Pt with increasingly flexed posture and slowing velocity as his gait progresses. Pt reports weakness cause for increased flexion. D/c plan remains appropriate at this time. PT will continue to follow acutely.    Follow Up Recommendations  SNF     Equipment Recommendations  None recommended by PT       Precautions / Restrictions Precautions Precautions: Fall Restrictions Weight Bearing Restrictions: No    Mobility  Bed Mobility               General bed mobility comments: sitting EOB on entry finishing lunch    Transfers Overall transfer level: Needs assistance Equipment used: Rolling walker (2 wheeled) Transfers: Sit to/from Stand Sit to Stand: Min guard         General transfer comment: min guard for safety  Ambulation/Gait Ambulation/Gait assistance: Min guard;Min assist Gait Distance (Feet): 40 Feet Assistive device: None Gait Pattern/deviations: Trunk flexed Gait velocity: progressively slower Gait velocity interpretation: <1.31 ft/sec, indicative of household ambulator General Gait Details: pt begins ambulation with slightly flexed hips and knees, and min guard assist, as ambulation progresses pt with increasingly flexed posture and requiring minA for steadying. Pt reports weakness cause of increasing flexion         Balance Overall balance  assessment: Needs assistance   Sitting balance-Leahy Scale: Good     Standing balance support: No upper extremity supported Standing balance-Leahy Scale: Fair                              Cognition Arousal/Alertness: Awake/alert Behavior During Therapy: WFL for tasks assessed/performed Overall Cognitive Status: Impaired/Different from baseline Area of Impairment: Safety/judgement;Awareness;Problem solving                         Safety/Judgement: Decreased awareness of safety;Decreased awareness of deficits   Problem Solving: Requires verbal cues;Requires tactile cues;Decreased initiation           General Comments General comments (skin integrity, edema, etc.): VSS on RA      Pertinent Vitals/Pain Pain Assessment: No/denies pain     PT Goals (current goals can now be found in the care plan section) Acute Rehab PT Goals Patient Stated Goal: to feel better PT Goal Formulation: With family Time For Goal Achievement: 03/24/21 Potential to Achieve Goals: Fair Progress towards PT goals: Progressing toward goals    Frequency    Min 3X/week      PT Plan Current plan remains appropriate       AM-PAC PT "6 Clicks" Mobility   Outcome Measure  Help needed turning from your back to your side while in a flat bed without using bedrails?: None Help needed moving from lying on your back to sitting on the side of a flat bed without using bedrails?: None Help needed moving to and from a bed to  a chair (including a wheelchair)?: A Little Help needed standing up from a chair using your arms (e.g., wheelchair or bedside chair)?: A Little Help needed to walk in hospital room?: A Little Help needed climbing 3-5 steps with a railing? : A Lot 6 Click Score: 19    End of Session Equipment Utilized During Treatment: Gait belt Activity Tolerance: Patient tolerated treatment well Patient left: in bed;with bed alarm set;with call bell/phone within reach  (sitting EoB) Nurse Communication: Mobility status PT Visit Diagnosis: Unsteadiness on feet (R26.81);Muscle weakness (generalized) (M62.81);Difficulty in walking, not elsewhere classified (R26.2)     Time: 7824-2353 PT Time Calculation (min) (ACUTE ONLY): 17 min  Charges:  $Gait Training: 8-22 mins                     Reeve Turnley B. Migdalia Dk PT, DPT Acute Rehabilitation Services Pager [phone] Office [phone]    [place] 03/14/2021, 12:28 PM

## 2021-03-15 LAB — CBC
HCT: 39.1 % (ref 39.0–52.0)
Hemoglobin: 12.9 g/dL — ABNORMAL LOW (ref 13.0–17.0)
MCH: 33.5 pg (ref 26.0–34.0)
MCHC: 33 g/dL (ref 30.0–36.0)
MCV: 101.6 fL — ABNORMAL HIGH (ref 80.0–100.0)
Platelets: 118 10*3/uL — ABNORMAL LOW (ref 150–400)
RBC: 3.85 MIL/uL — ABNORMAL LOW (ref 4.22–5.81)
RDW: 12.4 % (ref 11.5–15.5)
WBC: 4.8 10*3/uL (ref 4.0–10.5)
nRBC: 0 % (ref 0.0–0.2)

## 2021-03-15 NOTE — Progress Notes (Signed)
PROGRESS NOTE    [name] Herring   GQQ:[id number]  DOB: 1961-05-26  PCP: Sonia Side., FNP    DOA: 03/08/2021 LOS: 7   Assessment & Plan   Principal Problem:   Dehydration Active Problems:   Human immunodeficiency virus (HIV) disease ([place])   Hypothyroidism   Depression   Alcohol abuse   HTN (hypertension)   Tobacco abuse   Emphysema lung (HCC)   AKI (acute kidney injury) ([place])   History of pulmonary embolism   Schizophrenia (HCC)   Hypernatremia   Rhabdomyolysis   Protein-calorie malnutrition, severe   Failure to thrive in adult   Dehydration / Hypernatremia / Rhabdomyolysis - all POA. Pt reported very little PO intake x 2 weeks prior to admission.  Given bolus IV fluids in ED. CK and renal function normalized --monitor BMP's --Monitor liver/renal function and electrolytes --Encourage PO intake --Dietician consulted  AKI - due to above, pre-renal azotemia.  Resolved. --off IV hydration  Epigastric pain / ?Hx of gastric ulcer Chest/epigastric pain AM of 6/16 relieved by GI cocktail. No EKG changes to suggest acute ischemia. --Tried Pepcid qHS, added Protonix qAM, still symptomatic.  Increased PPI to BID and added Carafate - Improved. --Continue Protonix 40 mg PO BID  --Continue Carafate --Tums PRN  Severe protein calorie malnutrition - with temporal waisting, Body mass index is 18.17 kg/m.  Albumin 2.6.  Due to inadequate caloric intake and possibly HIV. --Dietician following, appreciate recs  Poor Living Situation - hx provided, pt was living in squalor and not caring for himself or with anyone helping him. --TOC consulted --Daughter hopes to move pt closer to her in [place]  General debility / Generalized Weakness / Ambulatory Dysfunction --PT and OT recommending SNF --TOC following for placement  Hx of hypertension: normotensive to soft BP with home meds held.  Med hx shows regimen is amlodipine 10 mg daily, clonidine 0.2 mg TID, lisinopril 40  mg daily, Lopressor 25 mg BID. 6/17: BP's running high, resume some meds --Started losartan in place of lisinopril --Continue amlodipine --Suspect has not been taking meds --Monitor BP closely, watch for rebound HTN (none seen)  Hx of HIV - on Biktarvy - initially held due to renal function, resumed 6/15.  Hypothyroidism - Not on medication.   TSH normal at 0.688.  Follow up outpatient.  COPD/emphysema -stable, not exacerbated.  Continue home inhaler regimen and as needed albuterol  History of PE/DVT -continue Eliquis  Depression -continue home medication  History of alcohol abuse -patient reported not drinking alcohol in 2 months.  Started on thiamine, multivitamin and folic acid on admission.  No signs of withdrawal so far.  On gabapentin, continue for now.  Monitor closely.  Tobacco abuse -nicotine patch ordered  Thrombocytopenia - suspect underlying liver dz.  Not on any heparin products or antiplatelet agents.  Monitor.  Macrocytosis -likely due to chronic alcohol consumption.   Folate within normal, vit B12 elevated.  Schizophrenia - appears clinically stable.   --Continue all other home medications --Seroquel was initially held given elevated CK - resumed   Patient BMI: Body mass index is 18.17 kg/m.   DVT prophylaxis:  apixaban (ELIQUIS) tablet 5 mg   Diet:  Diet Orders (From admission, onward)     Start     Ordered   03/08/21 1421  Diet regular Room service appropriate? Yes; Fluid consistency: Thin  Diet effective now       Question Answer Comment  Room service appropriate? Yes   Fluid  consistency: Thin      03/08/21 1423              Code Status: Full Code   Brief Narrative / Hospital Course to Date:   [name] Herring is a 60 y.o. male with medical history significant of HIV, HTN, hx of PE/DVT on eliquis and IVC filter placement in 2018, emphysema, depression, personality disorder,  tobacco abuse (1/2 PPD), schizophrenia, alcohol abuse (has not had  a drink in 2 months) and gastric ucler he thinks 3 years ago who presents to ER after fall x 3 days ago due to an episode of dizziness.  See full H&P for further details.  Patient's daughter lives in [place] and she came to check on him after not hearing from him in 2 weeks and found bugs everywhere and called EMS, contacted patient's Education officer, museum.  Patient is daughter reports ports she thought an aunt who lives locally had been checking in on patient.   Recently admitted at behavior health for worsening comman auditory hallucination and suicidal ideations -patient denied these complaints on admission.    Evaluation in the ED consistent with dehydration including acute kidney injury, hypernatremia and rhabdomyolysis.  These are being treated with IV hydration.  TOC, PT, OT are consulted for disposition planning.    Subjective 03/15/21    Patient seated edge of bed eating breakfast.  Reports he feels well.  No acute complaints.  No abdominal pain.   Disposition Plan & Communication   Status is: Inpatient  Remains inpatient appropriate because: Unsafe d/c.  Pt requires SNF placement for short term rehab, is unsafe to return to his prior place of residence.  Dispo: The patient is from: Home              Anticipated d/c is to: SNF              Patient currently IS medically stable for d/c.   Difficult to place patient TBD   Family Communication: None present  Consults, Procedures, Significant Events   Consultants:  None  Procedures:  None  Antimicrobials:  Anti-infectives (From admission, onward)    Start     Dose/Rate Route Frequency Ordered Stop   03/09/21 1200  bictegravir-emtricitabine-tenofovir AF (BIKTARVY) 50-200-25 MG per tablet 1 tablet        1 tablet Oral Daily 03/09/21 1057           Micro    Objective   Vitals:   03/14/21 2301 03/15/21 0500 03/15/21 0750 03/15/21 1256  BP: 133/78 131/90  125/81  Pulse: 80 62  75  Resp: 20 18  18   Temp: 97.6 F  (36.4 C) 98.2 F (36.8 C)  98.3 F (36.8 C)  TempSrc: Oral Oral  Oral  SpO2: 96% 97% 98% 96%  Weight:      Height:        Intake/Output Summary (Last 24 hours) at 03/15/2021 1535 Last data filed at 03/15/2021 0504 Gross per 24 hour  Intake 840 ml  Output 300 ml  Net 540 ml   Filed Weights   03/08/21 1038 03/08/21 2220  Weight: 58 kg 54.2 kg    Physical Exam:  General exam: awake, alert no acute distress, cachectic, sitting edge of bed eating breakfast Respiratory system: normal respiratory effort, on room air. Central nervous system: Alert, oriented x3, no gross focal deficits, normal speech Extremities: no edema, moves all, normal tone  Labs   Data Reviewed: I have personally reviewed following labs and  imaging studies  CBC: Recent Labs  Lab 03/09/21 0559 03/10/21 0345 03/15/21 0205  WBC 6.1 4.7 4.8  HGB 12.7* 12.1* 12.9*  HCT 38.6* 36.5* 39.1  MCV 104.3* 102.8* 101.6*  PLT PLATELET CLUMPS NOTED ON SMEAR, UNABLE TO ESTIMATE 81* 275*   Basic Metabolic Panel: Recent Labs  Lab 03/08/21 1651 03/08/21 2322 03/09/21 0559 03/10/21 0345 03/11/21 0337 03/12/21 0633  NA 152* 147* 146* 145 143 141  K 4.3 3.9 3.9 3.6 3.5 3.7  CL 115* 113* 115* 114* 112* 110  CO2 25 27 26 26 25 25   GLUCOSE 109* 124* 97 88 94 89  BUN 56* 47* 40* 27* 14 17  CREATININE 2.17* 1.91* 1.55* 1.33* 0.98 0.95  CALCIUM 10.2 9.6 9.3 8.9 8.9 9.3  MG 2.9*  --   --   --  1.9  --   PHOS  --   --   --   --  2.7  --    GFR: Estimated Creatinine Clearance: 64.2 mL/min (by C-G formula based on SCr of 0.95 mg/dL). Liver Function Tests: Recent Labs  Lab 03/08/21 1651 03/09/21 0559 03/09/21 1635 03/10/21 0345 03/12/21 0633  AST 83* 63* 54* 43* 24  ALT 37 33 33 29 30  ALKPHOS 49 41 41 43 46  BILITOT 0.9 0.6 0.6 0.3 0.6  PROT 7.3 5.7* 5.5* 5.0* 5.3*  ALBUMIN 4.0 3.0* 2.9* 2.6* 2.7*   No results for input(s): LIPASE, AMYLASE in the last 168 hours. No results for input(s): AMMONIA in the last  168 hours. Coagulation Profile: No results for input(s): INR, PROTIME in the last 168 hours.  Cardiac Enzymes: Recent Labs  Lab 03/08/21 1651 03/09/21 0559 03/09/21 1635 03/10/21 0345 03/12/21 0633  CKTOTAL 1,979* 1,204* 814* 533* 135   BNP (last 3 results) No results for input(s): PROBNP in the last 8760 hours. HbA1C: No results for input(s): HGBA1C in the last 72 hours. CBG: Recent Labs  Lab 03/12/21 1700  GLUCAP 119*   Lipid Profile: No results for input(s): CHOL, HDL, LDLCALC, TRIG, CHOLHDL, LDLDIRECT in the last 72 hours. Thyroid Function Tests: No results for input(s): TSH, T4TOTAL, FREET4, T3FREE, THYROIDAB in the last 72 hours.  Anemia Panel: Recent Labs    03/13/21 0943  FERRITIN 61  TIBC 255  IRON 67    Sepsis Labs: No results for input(s): PROCALCITON, LATICACIDVEN in the last 168 hours.   Recent Results (from the past 240 hour(s))  Urine culture     Status: Abnormal   Collection Time: 03/08/21 10:40 AM   Specimen: In/Out Cath Urine  Result Value Ref Range Status   Specimen Description IN/OUT CATH URINE  Final   Special Requests   Final    NONE Performed at [facility], [address], [place], [place] [address]    Culture MULTIPLE SPECIES PRESENT, SUGGEST RECOLLECTION (A)  Final   Report Status 03/09/2021 FINAL  Final  Blood Culture (routine x 2)     Status: None   Collection Time: 03/08/21 10:50 AM   Specimen: BLOOD LEFT FOREARM  Result Value Ref Range Status   Specimen Description BLOOD LEFT FOREARM  Final   Special Requests   Final    BOTTLES DRAWN AEROBIC AND ANAEROBIC Blood Culture adequate volume   Culture   Final    NO GROWTH 5 DAYS Performed at [facility], [place] [address] [place], [place] [address]    Report Status 03/13/2021 FINAL  Final  Blood Culture (routine x 2)  Status: None   Collection Time: 03/08/21 10:51 AM   Specimen: BLOOD  Result Value Ref Range Status   Specimen Description BLOOD SITE NOT  SPECIFIED  Final   Special Requests   Final    BOTTLES DRAWN AEROBIC AND ANAEROBIC Blood Culture adequate volume   Culture   Final    NO GROWTH 5 DAYS Performed at [facility], [address], [place], [place] [address]    Report Status 03/13/2021 FINAL  Final  Resp Panel by RT-PCR (Flu A&B, Covid) Nasopharyngeal Swab     Status: None   Collection Time: 03/08/21 12:13 PM   Specimen: Nasopharyngeal Swab; Nasopharyngeal(NP) swabs in vial transport medium  Result Value Ref Range Status   SARS Coronavirus 2 by RT PCR NEGATIVE NEGATIVE Final    Comment: (NOTE) SARS-CoV-2 target nucleic acids are NOT DETECTED.  The SARS-CoV-2 RNA is generally detectable in upper respiratory specimens during the acute phase of infection. The lowest concentration of SARS-CoV-2 viral copies this assay can detect is 138 copies/mL. A negative result does not preclude SARS-Cov-2 infection and should not be used as the sole basis for treatment or other patient management decisions. A negative result may occur with  improper specimen collection/handling, submission of specimen other than nasopharyngeal swab, presence of viral mutation(s) within the areas targeted by this assay, and inadequate number of viral copies(<138 copies/mL). A negative result must be combined with clinical observations, patient history, and epidemiological information. The expected result is Negative.  Fact Sheet for Patients:  EntrepreneurPulse.com.au  Fact Sheet for Healthcare Providers:  IncredibleEmployment.be  This test is no t yet approved or cleared by the [place] FDA and  has been authorized for detection and/or diagnosis of SARS-CoV-2 by FDA under an Emergency Use Authorization (EUA). This EUA will remain  in effect (meaning this test can be used) for the duration of the COVID-19 declaration under Section 564(b)(1) of the Act, 21 U.S.C.section 360bbb-3(b)(1), unless the  authorization is terminated  or revoked sooner.       Influenza A by PCR NEGATIVE NEGATIVE Final   Influenza B by PCR NEGATIVE NEGATIVE Final    Comment: (NOTE) The Xpert Xpress SARS-CoV-2/FLU/RSV plus assay is intended as an aid in the diagnosis of influenza from Nasopharyngeal swab specimens and should not be used as a sole basis for treatment. Nasal washings and aspirates are unacceptable for Xpert Xpress SARS-CoV-2/FLU/RSV testing.  Fact Sheet for Patients: EntrepreneurPulse.com.au  Fact Sheet for Healthcare Providers: IncredibleEmployment.be  This test is not yet approved or cleared by the [place] FDA and has been authorized for detection and/or diagnosis of SARS-CoV-2 by FDA under an Emergency Use Authorization (EUA). This EUA will remain in effect (meaning this test can be used) for the duration of the COVID-19 declaration under Section 564(b)(1) of the Act, 21 U.S.C. section 360bbb-3(b)(1), unless the authorization is terminated or revoked.  Performed at [facility], [place] [address], [place],  [address]       Imaging Studies   No results found.   Medications   Scheduled Meds:  amLODipine  5 mg Oral Daily   apixaban  5 mg Oral BID   atorvastatin  40 mg Oral QHS   benztropine  1 mg Oral BID   bictegravir-emtricitabine-tenofovir AF  1 tablet Oral Daily   feeding supplement  237 mL Oral TID BM   folic acid  1 mg Oral Daily   gabapentin  600 mg Oral QHS   losartan  25  mg Oral Daily   mirtazapine  15 mg Oral QHS   mometasone-formoterol  2 puff Inhalation BID   montelukast  10 mg Oral Daily   multivitamin with minerals  1 tablet Oral Daily   mupirocin ointment   Topical BID   nicotine  14 mg Transdermal Daily   pantoprazole  40 mg Oral BID   QUEtiapine  25 mg Oral QHS   risperiDONE  2 mg Oral BID   sucralfate  1 g Oral BID AC   thiamine  100 mg Oral Daily   valbenazine  80 mg Oral QHS   Continuous  Infusions:       LOS: 7 days    Time spent: 20 minutes    Ezekiel Slocumb, DO [facility]  03/15/2021, 3:35 PM      If 7PM-7AM, please contact night-coverage. How to contact the [facility] Attending or Consulting provider [place] or covering provider during after hours [place], for this patient?    Check the care team in [facility] and look for a) attending/consulting TRH provider listed and b) the [facility] team listed Log into [email or web address] and use [place]'s universal password to access. If you do not have the password, please contact the hospital operator. Locate the [facility] provider you are looking for under [facility] and page to a number that you can be directly reached. If you still have difficulty reaching the provider, please page the [facility] (Director on Call) for the Hospitalists listed on amion for assistance.

## 2021-03-15 NOTE — TOC Progression Note (Signed)
Transition of Care [facility]) - Progression Note    Patient Details  Name: ALIREZA POLLACK MRN: [id number] Date of Birth: 03/14/1961  Transition of Care [facility]) CM/SW [place], [place] Phone Number: 03/15/2021, 3:34 PM  Clinical Narrative:     CSW facilitated phone interview between pt and PASSR representative B Aikens. Pt completed interview. B Aikens states completed paperwork  for [facility] would be submitted later today for pt to receive is PASSR number.    Expected Discharge Plan: [place] Barriers to Discharge: Continued Medical Work up  Expected Discharge Plan and Services Expected Discharge Plan: [place] arrangements for the past 2 months: Single Family Home                                       Social Determinants of Health (SDOH) Interventions    Readmission Risk Interventions No flowsheet data found.

## 2021-03-16 DIAGNOSIS — J438 Other emphysema: Secondary | ICD-10-CM

## 2021-03-16 DIAGNOSIS — F1010 Alcohol abuse, uncomplicated: Secondary | ICD-10-CM

## 2021-03-16 DIAGNOSIS — N179 Acute kidney failure, unspecified: Principal | ICD-10-CM

## 2021-03-16 NOTE — Progress Notes (Signed)
Occupational Therapy Treatment Patient Details Name: [name] Herring MRN: [id number] DOB: [birth year] Today's Date: 03/16/2021    History of present illness Pt is a 60 year old man admitted 03/09/21 from home where he was living in squalor. + dehydration, rhamdomyolosis, AKI, hypernatremia. PMH: HIV, hypothyroidism, depression, alcohol and tobacco dependence, emphysema, personality disorder, schizophrenia.   OT comments  Patient progressing and showed improved ability to perform standing toileting and toilet t/f, LE dressing in standing as well as sitting and standing to perform 3 grooming tasks with all with setup/supervision, compared to previous session where pt required a greater level of assistance while sitting. Patient remains limited by decreased safety awareness and decreased awareness of current impairments, generalized weakness and limited activity tolerance, along with deficits noted below. Pt continues to demonstrate good rehab potential and would benefit from continued skilled OT to increase safety and independence with ADLs and functional transfers to allow pt to return home safely and reduce caregiver burden and fall risk.   Follow Up Recommendations  SNF;Supervision/Assistance - 24 hour    Equipment Recommendations  3 in 1 bedside commode    Recommendations for Other Services      Precautions / Restrictions Precautions Precautions: Fall Restrictions Weight Bearing Restrictions: No       Mobility Bed Mobility               General bed mobility comments: Pt in recliner    Transfers Overall transfer level: Needs assistance   Transfers: Sit to/from Stand Sit to Stand: Supervision         General transfer comment: Pt pt request, pt performed hallway ambulation to ~80' initially supervision, but required Min Guard HHA for last ~25' due to fatigue. Pt takes quick steps, stooped and partially toe walks. Pt reports that he has done this since childhood.     Balance                                           ADL either performed or assessed with clinical judgement   ADL Overall ADL's : Needs assistance/impaired     Grooming: Standing;Wash/dry face;Wash/dry hands;Oral care;Supervision/safety               Lower Body Dressing: Supervision/safety;Sitting/lateral leans Lower Body Dressing Details (indicate cue type and reason): Pt doffed too large socks and donned new socks while sitting in recliner with setup only.  In standing, pt able to manage his clothing prior to descending to toilet. Toilet Transfer: Regular Chartered certified accountant Details (indicate cue type and reason): Pt able to demostrate Sit<>Stand from low commode without use of grab bar with supervision. Toileting- Clothing Manipulation and Hygiene: Supervision/safety Toileting - Clothing Manipulation Details (indicate cue type and reason): Pt stood at commode to void bladder with supervision for safety.     Functional mobility during ADLs: Min guard;Supervision/safety       Vision Patient Visual Report: No change from baseline     Perception     Praxis      Cognition Arousal/Alertness: Awake/alert Behavior During Therapy: Impulsive;WFL for tasks assessed/performed                                   General Comments: Pt jumped out of his chair stating, "I need to go to the bathroom" after OT just  leaving to bring pt a mask for hallway and had instructed pt to wait.        Exercises     Shoulder Instructions       General Comments      Pertinent Vitals/ Pain       Pain Assessment: No/denies pain  Home Living                                          Prior Functioning/Environment              Frequency  Min 2X/week        Progress Toward Goals  OT Goals(current goals can now be found in the care plan section)  Progress towards OT goals: Progressing toward goals  Acute  Rehab OT Goals Patient Stated Goal: to walk without a walker OT Goal Formulation: With patient Time For Goal Achievement: 03/24/21 Potential to Achieve Goals: Good  Plan Discharge plan remains appropriate    Co-evaluation                 AM-PAC OT "6 Clicks" Daily Activity     Outcome Measure   Help from another person eating meals?: None Help from another person taking care of personal grooming?: A Little Help from another person toileting, which includes using toliet, bedpan, or urinal?: A Little Help from another person bathing (including washing, rinsing, drying)?: A Little Help from another person to put on and taking off regular upper body clothing?: A Little Help from another person to put on and taking off regular lower body clothing?: A Little 6 Click Score: 19    End of Session Equipment Utilized During Treatment: Gait belt  OT Visit Diagnosis: Muscle weakness (generalized) (M62.81)   Activity Tolerance Patient tolerated treatment well;Patient limited by fatigue   Patient Left in chair;with call bell/phone within reach;with chair alarm set   Nurse Communication Mobility status        Time: 1610-9604 OT Time Calculation (min): 25 min  Charges: OT General Charges $OT Visit: 1 Visit OT Treatments $Self Care/Home Management : 8-22 mins $Therapeutic Activity: 8-22 mins  Anderson Malta, OT Acute Rehab Services Office: [phone] 03/16/2021    [name] 03/16/2021, 1:14 PM

## 2021-03-16 NOTE — TOC Progression Note (Addendum)
Transition of Care [facility]) - Progression Note    Patient Details  Name: [name] Herring MRN: [id number] Date of Birth: 1961-09-24  Transition of Care [facility]) CM/SW Contact  Curlene Labrum, RN Phone Number: 03/16/2021, 11:49 AM  Clinical Narrative:    Case management spoke with the patient's daughter, [name] Herring, on the phone and the patient has been accepted at [facility] for admission.  The patient's daughter, [name] Herring is aware that patient has received a bed offer at [facility] and she is in agreement for admission.  She plans to visit the patient's home and treat his apartment for roach issues and clean up the clutter in the home.  I spoke with the patient and explained to him that the housing authority placed a warning note on the apartment door that if he did not clean up the apartment that he might lose his apartment.  The patient was in agreement to admission to [facility] so he can receive rehabilitation.  Madilyn Fireman, MSW started insurance authorization with [facility] and as soon as the insurance approves the patient will transfer to the facility.  CM and MSW will continue to follow the patient for SNF placement at [facility] skilled nursing facility.  Expected Discharge Plan: [place] Barriers to Discharge: Continued Medical Work up  Expected Discharge Plan and Services Expected Discharge Plan: [place] arrangements for the past 2 months: Single Family Home                                       Social Determinants of Health (SDOH) Interventions    Readmission Risk Interventions No flowsheet data found.

## 2021-03-16 NOTE — Progress Notes (Signed)
CSW spoke with Amada Jupiter at [facility] who states the facility can accept the patient.  CSW initiated insurance authorization through [facility].  Madilyn Fireman, MSW, LCSW Transitions of Care  Clinical Social Worker II [phone]

## 2021-03-16 NOTE — Progress Notes (Signed)
Physical Therapy Treatment Patient Details Name: [name] Herring MRN: [id number] DOB: 1961-09-24 Today's Date: 03/16/2021    History of Present Illness Pt is a 60 year old man admitted 03/09/21 from home where he was living in squalor. + dehydration, rhamdomyolosis, AKI, hypernatremia. PMH: HIV, hypothyroidism, depression, alcohol and tobacco dependence, emphysema, personality disorder, schizophrenia.    PT Comments    Pt sitting up in recliner on entry, states "This is the most uncomfortable chair". Pt limited in safe mobility by decreased knowledge of deficits, decreased knowledge of DME, and decreased strength and endurance. Pt min guard for transfers and initially min guard for ambulation with RW, however progresses to heavy min A with increasing flexed posture and bilateral knee buckling. D/c plans remain appropriate at this time. PT will continue to follow acutely.   Follow Up Recommendations  SNF     Equipment Recommendations  None recommended by PT       Precautions / Restrictions Precautions Precautions: Fall Restrictions Weight Bearing Restrictions: No    Mobility  Bed Mobility               General bed mobility comments: up in recliner on entry    Transfers Overall transfer level: Needs assistance Equipment used: Rolling walker (2 wheeled) Transfers: Sit to/from Stand Sit to Stand: Min guard         General transfer comment: min guard for safety  Ambulation/Gait Ambulation/Gait assistance: Min guard;Min assist Gait Distance (Feet): 80 Feet Assistive device: None Gait Pattern/deviations: Trunk flexed Gait velocity: progressively slower Gait velocity interpretation: <1.31 ft/sec, indicative of household ambulator General Gait Details: pt with strong start to ambulation with upright posture and good proximity to RW, with fatigue pt with increasing flexed posture, bilateral knee buckling. pt with poor understanding of his limited tolerance to  activity          Balance Overall balance assessment: Needs assistance   Sitting balance-Leahy Scale: Good     Standing balance support: Bilateral upper extremity supported Standing balance-Leahy Scale: Fair Standing balance comment: requires UE support for dynamic activity                            Cognition Arousal/Alertness: Awake/alert Behavior During Therapy: WFL for tasks assessed/performed Overall Cognitive Status: Impaired/Different from baseline Area of Impairment: Safety/judgement;Awareness;Problem solving;Following commands                       Following Commands: Follows multi-step commands inconsistently Safety/Judgement: Decreased awareness of safety;Decreased awareness of deficits   Problem Solving: Requires verbal cues;Requires tactile cues;Decreased initiation General Comments: pt able to carryover commands for proximity to RW from prior session however pt unable to follow despite maximal multimodal cuing         General Comments General comments (skin integrity, edema, etc.): VSS on RA      Pertinent Vitals/Pain Pain Assessment: No/denies pain     PT Goals (current goals can now be found in the care plan section) Acute Rehab PT Goals Patient Stated Goal: to feel better PT Goal Formulation: With family Time For Goal Achievement: 03/24/21 Potential to Achieve Goals: Fair Progress towards PT goals: Progressing toward goals    Frequency    Min 3X/week      PT Plan Current plan remains appropriate       AM-PAC PT "6 Clicks" Mobility   Outcome Measure  Help needed turning from your back to your side while  in a flat bed without using bedrails?: None Help needed moving from lying on your back to sitting on the side of a flat bed without using bedrails?: None Help needed moving to and from a bed to a chair (including a wheelchair)?: A Little Help needed standing up from a chair using your arms (e.g., wheelchair or  bedside chair)?: A Little Help needed to walk in hospital room?: A Little Help needed climbing 3-5 steps with a railing? : A Lot 6 Click Score: 19    End of Session Equipment Utilized During Treatment: Gait belt Activity Tolerance: Patient tolerated treatment well Patient left: in bed;with bed alarm set;with call bell/phone within reach Nurse Communication: Mobility status PT Visit Diagnosis: Unsteadiness on feet (R26.81);Muscle weakness (generalized) (M62.81);Difficulty in walking, not elsewhere classified (R26.2)     Time: 5521-7471 PT Time Calculation (min) (ACUTE ONLY): 20 min  Charges:  $Gait Training: 8-22 mins                     Anette Barra B. Migdalia Dk PT, DPT Acute Rehabilitation Services Pager [phone] Office [phone]    [place] 03/16/2021, 2:22 PM

## 2021-03-16 NOTE — Progress Notes (Signed)
Nutrition Follow-up  DOCUMENTATION CODES:   Severe malnutrition in context of chronic illness, Severe malnutrition in context of social or environmental circumstances, Underweight  INTERVENTION:   Continue Ensure Enlive po TID, each supplement provides 350 kcal and 20 grams of protein Continue Magic cup TID with meals, each supplement provides 290 kcal and 9 grams of protein  NUTRITION DIAGNOSIS:   Severe Malnutrition related to chronic illness, social / environmental circumstances as evidenced by percent weight loss, severe fat depletion, severe muscle depletion.  Ongoing   GOAL:   Patient will meet greater than or equal to 90% of their needs  Met with intake of meals and supplements  MONITOR:   PO intake, Supplement acceptance, Labs, Weight trends, Skin, I & O's  REASON FOR ASSESSMENT:   Consult Assessment of nutrition requirement/status  ASSESSMENT:   60 yo male with a PMH of HIV, hypothyroidism, depression, EtOH and tobacco dependence, emphysema, personality disorder, and schizophrenia who presents with dehydration, rhamdomyolosis, AKI, and hypernatremia from home living in squalor.  Renal function has improved. Patient remains very weak and deconditioned. Plans for SNF at discharge.    Patient remains on a regular diet. Meal intakes: 100% x 3 meals 6/20 Drinking Ensure Enlive/Plus supplements TID between meals. Receiving magic cup supplements with meals.   Labs and medications reviewed.  No new weight available.  Diet Order:   Diet Order             Diet regular Room service appropriate? Yes; Fluid consistency: Thin  Diet effective now                   EDUCATION NEEDS:   Education needs have been addressed  Skin:  Skin Assessment: Skin Integrity Issues: Skin Integrity Issues:: Other (Comment) Other: Open abrasions/wounds  Last BM:  6/21  Height:   Ht Readings from Last 1 Encounters:  03/08/21 _0  (1.727 m)    Weight:   Wt Readings  from Last 1 Encounters:  03/08/21 54.2 kg    Ideal Body Weight:  70 kg  BMI:  Body mass index is 18.17 kg/m.  Estimated Nutritional Needs:   Kcal:  2200-2400  Protein:  85-100 grams  Fluid:  >2 L    Lucas Mallow, RD, LDN, CNSC Please refer to Amion for contact information.

## 2021-03-16 NOTE — Care Management Important Message (Signed)
Important Message  Patient Details  Name: [name] Herring MRN: [id number] Date of Birth: 1961/04/04   Medicare Important Message Given:  Yes     Lakeita Panther P Habiba Treloar 03/16/2021, 2:50 PM

## 2021-03-16 NOTE — Progress Notes (Signed)
PROGRESS NOTE    [name] Herring  KGU:[id number] DOB: [birth year] DOA: 03/08/2021 PCP: Sonia Side., FNP    Brief Narrative:  Mrs. On was admitted to the hospital with the working diagnosis of rhabdomyolysis and hyponatremia in the setting of dehydration.   60 year old male past medical history for HIV, hypertension, PE/DVT, status post IVC filter 2018, emphysema, depression, tobacco abuse, schizophrenia, alcohol abuse, personality disorder and gastric ulcers who presented 3 days after mechanical fall.  Reported being dizzy, no loss of consciousness or evident head trauma.  Apparently not eating for about 2 days prior to hospitalization.  He had a recent hospitalization at behavioral health due to hallucinations and suicidal ideations. On his initial physical examination blood pressure 101/81, heart rate 69, respiratory rate 26, oxygen saturation 96%, he was cachectic, dry mucous membranes, lungs with no wheezing or rhonchi, heart S1-S2, present, rhythmic, abdomen soft, skin with multiple lesions and lacerations over the right arm.  Sodium 154, potassium 4.1, chloride 121, bicarb 22, glucose 141, BUN 59, creatinine 2.74, CK 2185, white cell count 6.8, hemoglobin 15.5, hematocrit 48.4, platelets 126. SARS COVID-19 negative.  Urinalysis specific gravity 1.026, 30 protein.  0-5 white cells.  Head neck CT no acute changes, negative cervical spine fracture. Chest radiograph with no infiltrates, positive hyperinflation.  EKG 83 bpm, normal axis, normal intervals, sinus rhythm, no ST segment or T wave changes, positive LVH.  Patient was placed on intravenous fluids with good toleration, his kidney function improved along with his hyponatremia.  He has been placed on aggressive antiacid therapy for suspected gastritis.  He was evaluated by physical therapy/ occupational therapy, recommendations to continue care at skilled nursing facility.  Currently waiting insurance  authorization.  Assessment & Plan:   Principal Problem:   Dehydration Active Problems:   Human immunodeficiency virus (HIV) disease (HCC)   Hypothyroidism   Depression   Alcohol abuse   HTN (hypertension)   Tobacco abuse   Emphysema lung (HCC)   AKI (acute kidney injury) ([place])   History of pulmonary embolism   Schizophrenia (HCC)   Hypernatremia   Rhabdomyolysis   Protein-calorie malnutrition, severe   Failure to thrive in adult   AKI, due to acute dehydration, complicated with hypernatremia and rhabdomyolysis. Patient is tolerating po well, no nausea or vomiting. His renal function has improved and hypernatremia resolved.   He continue to be very weak and deconditioned, plan to transfer to SNF.  Will discontinue telemetry monitoring.   2. HIV, sever protein calorie protein malnutrition/ alcohol abuse./ thrombocytopenia/ macrocytosis Continue with antiretroviral therapy, Continue with nutritional supplementation.  No clinical signs of withdrawal, continue with neuro checks per unit protocol.   3. Gastritis. Symptoms improved with antiacid therapy, he is tolerating po well, no nausea or vomiting and positive bowel movement.  4. Schizophrenia/ tobacco abuse. Continue with Seroquel,risperidone and mirtazapine with good toleration. On benztropine, as needed hydroxyzine.   Qhs trazodone.  Continue with nicotine patch.   5. DVT and PE. Anticoagulation with apixaban.   6. COPD. No clinical signs of exacerbation.   7. HTN/ dyslipidemia. Continue blood pressure control with amlodipine and losartan.   Continue with atorvastatin.   Status is: Inpatient  Remains inpatient appropriate because:Inpatient level of care appropriate due to severity of illness  Dispo: The patient is from: Home              Anticipated d/c is to: SNF              Patient  currently is not medically stable to d/c.   Difficult to place patient No   DVT prophylaxis: Enoxaparin   Code Status:   full   Family Communication:  No family at the bedside      Nutrition Status: Nutrition Problem: Severe Malnutrition Etiology: chronic illness, social / environmental circumstances Signs/Symptoms: percent weight loss, severe fat depletion, severe muscle depletion Percent weight loss: 9.3 % Interventions: Ensure Enlive (each supplement provides 350kcal and 20 grams of protein), Magic cup, MVI     Subjective: Patient is feeling well but continue to be very weak and deconditioned, not yet back to his baseline, no nausea or vomiting and tolerating po well.   Objective: Vitals:   03/16/21 0120 03/16/21 0609 03/16/21 0800 03/16/21 1158  BP: 123/79 116/78  140/88  Pulse: 97 61  77  Resp: 18 19  18   Temp: 98.6 F (37 C) 97.8 F (36.6 C)  98.3 F (36.8 C)  TempSrc: Oral Oral  Oral  SpO2: 98% 98% 98% 100%  Weight:      Height:        Intake/Output Summary (Last 24 hours) at 03/16/2021 1321 Last data filed at 03/15/2021 1601 Gross per 24 hour  Intake --  Output 450 ml  Net -450 ml   Filed Weights   03/08/21 1038 03/08/21 2220  Weight: 58 kg 54.2 kg    Examination:   General: Not in pain or dyspnea,  Neurology: Awake and alert, non focal  E ENT: no pallor, no icterus, oral mucosa moist Cardiovascular: No JVD. S1-S2 present, rhythmic, no gallops, rubs, or murmurs. No lower extremity edema. Pulmonary: positive breath sounds bilaterally, adequate air movement, no wheezing, rhonchi or rales. Gastrointestinal. Abdomen soft and non tender Skin. No rashes Musculoskeletal: no joint deformities     Data Reviewed: I have personally reviewed following labs and imaging studies  CBC: Recent Labs  Lab 03/10/21 0345 03/15/21 0205  WBC 4.7 4.8  HGB 12.1* 12.9*  HCT 36.5* 39.1  MCV 102.8* 101.6*  PLT 81* 081*   Basic Metabolic Panel: Recent Labs  Lab 03/10/21 0345 03/11/21 0337 03/12/21 0633  NA 145 143 141  K 3.6 3.5 3.7  CL 114* 112* 110  CO2 26 25 25   GLUCOSE 88 94 89   BUN 27* 14 17  CREATININE 1.33* 0.98 0.95  CALCIUM 8.9 8.9 9.3  MG  --  1.9  --   PHOS  --  2.7  --    GFR: Estimated Creatinine Clearance: 64.2 mL/min (by C-G formula based on SCr of 0.95 mg/dL). Liver Function Tests: Recent Labs  Lab 03/09/21 1635 03/10/21 0345 03/12/21 0633  AST 54* 43* 24  ALT 33 29 30  ALKPHOS 41 43 46  BILITOT 0.6 0.3 0.6  PROT 5.5* 5.0* 5.3*  ALBUMIN 2.9* 2.6* 2.7*   No results for input(s): LIPASE, AMYLASE in the last 168 hours. No results for input(s): AMMONIA in the last 168 hours. Coagulation Profile: No results for input(s): INR, PROTIME in the last 168 hours. Cardiac Enzymes: Recent Labs  Lab 03/09/21 1635 03/10/21 0345 03/12/21 0633  CKTOTAL 814* 533* 135   BNP (last 3 results) No results for input(s): PROBNP in the last 8760 hours. HbA1C: No results for input(s): HGBA1C in the last 72 hours. CBG: Recent Labs  Lab 03/12/21 1700  GLUCAP 119*   Lipid Profile: No results for input(s): CHOL, HDL, LDLCALC, TRIG, CHOLHDL, LDLDIRECT in the last 72 hours. Thyroid Function Tests: No results for input(s): TSH,  T4TOTAL, FREET4, T3FREE, THYROIDAB in the last 72 hours. Anemia Panel: No results for input(s): VITAMINB12, FOLATE, FERRITIN, TIBC, IRON, RETICCTPCT in the last 72 hours.    Radiology Studies: I have reviewed all of the imaging during this hospital visit personally     Scheduled Meds:  amLODipine  5 mg Oral Daily   apixaban  5 mg Oral BID   atorvastatin  40 mg Oral QHS   benztropine  1 mg Oral BID   bictegravir-emtricitabine-tenofovir AF  1 tablet Oral Daily   feeding supplement  237 mL Oral TID BM   folic acid  1 mg Oral Daily   gabapentin  600 mg Oral QHS   losartan  25 mg Oral Daily   mirtazapine  15 mg Oral QHS   mometasone-formoterol  2 puff Inhalation BID   montelukast  10 mg Oral Daily   multivitamin with minerals  1 tablet Oral Daily   mupirocin ointment   Topical BID   nicotine  14 mg Transdermal Daily    pantoprazole  40 mg Oral BID   QUEtiapine  25 mg Oral QHS   risperiDONE  2 mg Oral BID   sucralfate  1 g Oral BID AC   thiamine  100 mg Oral Daily   valbenazine  80 mg Oral QHS   Continuous Infusions:   LOS: 8 days        Davianna Deutschman Gerome Apley, MD

## 2021-03-17 DIAGNOSIS — E870 Hyperosmolality and hypernatremia: Secondary | ICD-10-CM

## 2021-03-17 DIAGNOSIS — F200 Paranoid schizophrenia: Secondary | ICD-10-CM

## 2021-03-17 DIAGNOSIS — E039 Hypothyroidism, unspecified: Secondary | ICD-10-CM

## 2021-03-17 DIAGNOSIS — Z720 Tobacco use: Secondary | ICD-10-CM

## 2021-03-17 LAB — SARS CORONAVIRUS 2 (TAT 6-24 HRS): SARS Coronavirus 2: NEGATIVE

## 2021-03-17 MED ORDER — ENSURE ENLIVE PO LIQD: 237.0000 mL | Freq: Three times a day (TID) | ORAL | 0 refills | Status: AC

## 2021-03-17 MED ORDER — GABAPENTIN 600 MG PO TABS: 600.0000 mg | ORAL_TABLET | Freq: Every day | ORAL | 0 refills | Status: DC

## 2021-03-17 MED ORDER — LOSARTAN POTASSIUM 25 MG PO TABS: 25.0000 mg | ORAL_TABLET | Freq: Every day | ORAL | 0 refills | Status: DC

## 2021-03-17 MED ORDER — THIAMINE HCL 100 MG PO TABS: 100.0000 mg | ORAL_TABLET | Freq: Every day | ORAL | 0 refills | Status: AC

## 2021-03-17 MED ORDER — PANTOPRAZOLE SODIUM 40 MG PO TBEC: 40.0000 mg | DELAYED_RELEASE_TABLET | Freq: Two times a day (BID) | ORAL | 0 refills | Status: AC

## 2021-03-17 MED ORDER — NICOTINE 14 MG/24HR TD PT24: 14.0000 mg | MEDICATED_PATCH | Freq: Every day | TRANSDERMAL | 0 refills | Status: DC

## 2021-03-17 MED ORDER — AMLODIPINE BESYLATE 5 MG PO TABS: 5.0000 mg | ORAL_TABLET | Freq: Every day | ORAL | 0 refills | Status: AC

## 2021-03-17 MED ORDER — ADULT MULTIVITAMIN W/MINERALS CH: 1.0000 | ORAL_TABLET | Freq: Every day | ORAL | 0 refills | Status: AC

## 2021-03-17 MED ORDER — MIRTAZAPINE 15 MG PO TABS: 15.0000 mg | ORAL_TABLET | Freq: Every day | ORAL | 0 refills | Status: AC

## 2021-03-17 NOTE — Progress Notes (Addendum)
12:15pm: CSW received return call from [place] at [facility] who states the facility can likely offer patient a bed - facility will need to review cost of HIV medications prior to offering a bed.  CSW updated [facility] authorization to reflect facility change.  8:15am: Patient unable to go to [facility] as the facility admissions are on hold at this time.  CSW attempted to reach admissions at [facility] in [place] - a voicemail was left requesting a return call.  CSW spoke with Helene Kelp at [facility] who will review patient's clinicals for a possible bed offer.  Madilyn Fireman, MSW, LCSW Transitions of Care  Clinical Social Worker II [phone]

## 2021-03-17 NOTE — Discharge Summary (Signed)
Physician Discharge Summary  [name] Herring OQH:[id number] DOB: November 05, 1960 DOA: 03/08/2021  PCP: [name] Side., FNP  Admit date: 03/08/2021 Discharge date: 03/17/2021  Admitted From: Home  Disposition:  SNF   Recommendations for Outpatient Follow-up and new medication changes:  Follow up with [name] Folks FNP in 7 to 10 days.  Discontinue clonidine and metoprolol. Changed lisinopril to losartan.   Home Health: na   Equipment/Devices: na    Discharge Condition: stable  CODE STATUS: full  Diet recommendation: heart healthy   Brief/Interim Summary: [name] Herring was admitted to the hospital with the working diagnosis of rhabdomyolysis and hyponatremia in the setting of dehydration.    60 year old male past medical history for HIV, hypertension, PE/DVT, status post IVC filter 2018, emphysema, depression, tobacco abuse, schizophrenia, alcohol abuse, personality disorder and gastric ulcers who presented 3 days after mechanical fall.  Reported being dizzy, no loss of consciousness or evident head trauma.  Apparently not eating for about 2 days prior to hospitalization.  He had a recent hospitalization at behavioral health due to hallucinations and suicidal ideations. On his initial physical examination blood pressure 101/81, heart rate 69, respiratory rate 26, oxygen saturation 96%, he was cachectic, dry mucous membranes, lungs with no wheezing or rhonchi, heart S1-S2, present, rhythmic, abdomen soft, skin with multiple lesions and lacerations over the right arm.   Sodium 154, potassium 4.1, chloride 121, bicarb 22, glucose 141, BUN 59, creatinine 2.74, CK 2185, white cell count 6.8, hemoglobin 15.5, hematocrit 48.4, platelets 126. SARS COVID-19 negative.   Urinalysis specific gravity 1.026, 30 protein.  0-5 white cells.   Head neck CT no acute changes, negative cervical spine fracture. Chest radiograph with no infiltrates, positive hyperinflation.   EKG 83 bpm, normal axis, normal  intervals, sinus rhythm, no ST segment or T wave changes, positive LVH.  Chest radiograph with hyperinflation, no infiltrates.    Patient was placed on intravenous fluids with good toleration, his kidney function improved along with his hypernatremia.   He has been placed on aggressive antiacid therapy for suspected gastritis.   He was evaluated by physical therapy/ occupational therapy, recommendations to continue care at skilled nursing facility.   Acute kidney injury due to acute dehydration, complicated by hypernatremia and rhabdomyolysis. Patient was admitted to the medical ward, he received intravenous fluids and supportive medical therapy.  His kidney function and electrolytes improved.  At the time of discharge he is tolerating p.o. diet adequately.  His discharge sodium 141, potassium 3.7, chloride 110, bicarb 25, glucose 89, BUN 17, creatinine 0.95. CK 137.  2.  HIV, severe protein calorie malnutrition, alcohol abuse, thrombocytopenia and macrocytosis.  Patient was resumed on his antiretroviral therapy.  Received nutritional supplements. No signs of alcohol withdrawal during his hospitalization.  3.  Gastritis.  Patient with persistent dyspepsia symptoms, he was placed on aggressive antiacid therapy with good toleration. Continue proton pump inhibitors at discharge.  4.  Schizophrenia/tobacco abuse.  Patient with no agitation during his hospitalization, continue Seroquel, risperidone and mirtazapine, velbenazine.  Patient also on benztropine and trazodone. Continue nicotine patch.  5.  History of DVT/PE.  Continue anticoagulation with apixaban.  6.  COPD.  No clinical signs of acute exacerbation. Continue with budesonide and formoterol.   7.  Hypertension/dyslipidemia.  His blood pressure remained well controlled with amlodipine and losartan, discontinue clonidine and lisinopril. Continue statin therapy.  8. Hx of hypothyroid TSH was within normal range. Patient not on  levothyroxine   Discharge Diagnoses:  Principal Problem:  Dehydration Active Problems:   Human immunodeficiency virus (HIV) disease (HCC)   Hypothyroidism   Depression   Alcohol abuse   HTN (hypertension)   Tobacco abuse   Emphysema lung (HCC)   AKI (acute kidney injury) ([place])   History of pulmonary embolism   Schizophrenia (HCC)   Hypernatremia   Rhabdomyolysis   Protein-calorie malnutrition, severe   Failure to thrive in adult    Discharge Instructions   Allergies as of 03/17/2021       Reactions   Amoxicillin Shortness Of Breath, Rash   Has patient had a PCN reaction causing immediate rash, facial/tongue/throat swelling, [name] Has patient had a PCN reaction causing severe rash involving mucus membranes or skin necrosis: no Has patient had a PCN reaction that required hospitalization: yes [name] Herring office visit Has patient had a PCN reaction occurring within the last 10 years: no If all of the above answers are "NO", then may proceed with Cephalosporin use.   Latex Shortness Of Breath   Abilify [aripiprazole] Other (See Comments)   Double vision        Medication List     STOP taking these medications    cloNIDine 0.2 MG tablet Commonly known as: CATAPRES   hydrOXYzine 25 MG tablet Commonly known as: ATARAX/VISTARIL   lisinopril 40 MG tablet Commonly known as: ZESTRIL   metoprolol tartrate 25 MG tablet Commonly known as: LOPRESSOR       TAKE these medications    acetaminophen 500 MG tablet Commonly known as: TYLENOL Take 1 tablet (500 mg total) by mouth every 6 (six) hours as needed for moderate pain.   amLODipine 5 MG tablet Commonly known as: NORVASC Take 1 tablet (5 mg total) by mouth daily. What changed:  medication strength how much to take   apixaban 5 MG Tabs tablet Commonly known as: Eliquis Take 1 tablet (5 mg total) by mouth 2 (two) times daily.   atorvastatin 40 MG tablet Commonly known as:  LIPITOR Take 40 mg by mouth at bedtime.   benztropine 1 MG tablet Commonly known as: COGENTIN Take 1 tablet (1 mg total) by mouth 2 (two) times daily. For eps   Biktarvy 50-200-25 MG Tabs tablet Generic drug: bictegravir-emtricitabine-tenofovir AF Take 1 tablet by mouth daily.   feeding supplement Liqd Take 237 mLs by mouth 3 (three) times daily between meals.   fluticasone 50 MCG/ACT nasal spray Commonly known as: FLONASE Place 1-2 sprays into both nostrils daily as needed for rhinitis.   gabapentin 600 MG tablet Commonly known as: NEURONTIN Take 1 tablet (600 mg total) by mouth at bedtime. What changed:  medication strength how much to take additional instructions   losartan 25 MG tablet Commonly known as: COZAAR Take 1 tablet (25 mg total) by mouth daily.   mirtazapine 15 MG tablet Commonly known as: REMERON Take 1 tablet (15 mg total) by mouth at bedtime. What changed:  medication strength how much to take additional instructions   montelukast 10 MG tablet Commonly known as: SINGULAIR Take 10 mg by mouth daily.   multivitamin with minerals Tabs tablet Take 1 tablet by mouth daily.   nicotine 14 mg/24hr patch Commonly known as: NICODERM CQ - dosed in mg/24 hours Place 1 patch (14 mg total) onto the skin daily.   pantoprazole 40 MG tablet Commonly known as: PROTONIX Take 1 tablet (40 mg total) by mouth 2 (two) times daily.   QUEtiapine 25 MG tablet Commonly known as: SEROQUEL Take 1  tablet (25 mg total) by mouth at bedtime. For agitation   risperiDONE 2 MG tablet Commonly known as: RISPERDAL Take 1 tablet (2 mg total) by mouth 2 (two) times daily. For mood control   Symbicort 160-4.5 MCG/ACT inhaler Generic drug: budesonide-formoterol Inhale 2 puffs into the lungs 2 (two) times daily.   thiamine 100 MG tablet Take 1 tablet (100 mg total) by mouth daily.   traZODone 50 MG tablet Commonly known as: DESYREL Take 1 tablet (50 mg total) by mouth at  bedtime as needed for sleep.   valbenazine 80 MG capsule Commonly known as: Ingrezza Take 1 capsule (80 mg total) by mouth at bedtime. For tardive dyskinesia               Discharge Care Instructions  (From admission, onward)           Start     Ordered   03/17/21 0000  Discharge wound care:       Comments: Every shift, apply mupirocin ointment to abrasions on the right arm, shoulder and left leg.  Cover with nonadherent dressing and secure with foam dressing on the shoulder and leg, wrapped the arm with Kerlix.  Change twice daily.   03/17/21 0948            Allergies  Allergen Reactions   Amoxicillin Shortness Of Breath and Rash    Has patient had a PCN reaction causing immediate rash, facial/tongue/throat swelling, [name] Has patient had a PCN reaction causing severe rash involving mucus membranes or skin necrosis: no Has patient had a PCN reaction that required hospitalization: yes [name] Herring office visit Has patient had a PCN reaction occurring within the last 10 years: no If all of the above answers are "NO", then may proceed with Cephalosporin use.    Latex Shortness Of Breath   Abilify [Aripiprazole] Other (See Comments)    Double vision      Procedures/Studies: CT Head Wo Contrast  Result Date: 03/08/2021 CLINICAL DATA:  Head trauma failure to thrive EXAM: CT HEAD WITHOUT CONTRAST CT CERVICAL SPINE WITHOUT CONTRAST TECHNIQUE: Multidetector CT imaging of the head and cervical spine was performed following the standard protocol without intravenous contrast. Multiplanar CT image reconstructions of the cervical spine were also generated. COMPARISON:  CT head 10/29/2020 FINDINGS: CT HEAD FINDINGS Brain: Generalized atrophy without hydrocephalus. Mild white matter hypodensity bilaterally, stable from prior. Negative for acute infarct, hemorrhage, mass Vascular: Negative for hyperdense vessel Skull: Negative Sinuses/Orbits: Cyst left  maxillary sinus.  Negative orbit Other: None CT CERVICAL SPINE FINDINGS Alignment: Slight retrolisthesis at C4-5. Straightening of the cervical lordosis Skull base and vertebrae: Negative for fracture Soft tissues and spinal canal: Negative Disc levels: Mild disc degeneration and spurring most prominent at C4-5. Upper chest: Severe apical emphysema without acute abnormality. Other: None IMPRESSION: 1. No acute intracranial abnormality. Mild atrophy and mild chronic microvascular ischemia 2. Negative for cervical spine fracture 3. Severe emphysema Electronically Signed   By: Franchot Gallo M.D.   On: 03/08/2021 13:19   CT Cervical Spine Wo Contrast  Result Date: 03/08/2021 CLINICAL DATA:  Head trauma failure to thrive EXAM: CT HEAD WITHOUT CONTRAST CT CERVICAL SPINE WITHOUT CONTRAST TECHNIQUE: Multidetector CT imaging of the head and cervical spine was performed following the standard protocol without intravenous contrast. Multiplanar CT image reconstructions of the cervical spine were also generated. COMPARISON:  CT head 10/29/2020 FINDINGS: CT HEAD FINDINGS Brain: Generalized atrophy without hydrocephalus. Mild white matter hypodensity bilaterally,  stable from prior. Negative for acute infarct, hemorrhage, mass Vascular: Negative for hyperdense vessel Skull: Negative Sinuses/Orbits: Cyst left maxillary sinus.  Negative orbit Other: None CT CERVICAL SPINE FINDINGS Alignment: Slight retrolisthesis at C4-5. Straightening of the cervical lordosis Skull base and vertebrae: Negative for fracture Soft tissues and spinal canal: Negative Disc levels: Mild disc degeneration and spurring most prominent at C4-5. Upper chest: Severe apical emphysema without acute abnormality. Other: None IMPRESSION: 1. No acute intracranial abnormality. Mild atrophy and mild chronic microvascular ischemia 2. Negative for cervical spine fracture 3. Severe emphysema Electronically Signed   By: Franchot Gallo M.D.   On: 03/08/2021 13:19   DG  Chest Port 1 View  Result Date: 03/08/2021 CLINICAL DATA:  Cough for 2 days EXAM: PORTABLE CHEST 1 VIEW COMPARISON:  12/02/2020 FINDINGS: Cardiac shadow is stable. Lungs are well aerated bilaterally. No focal infiltrate or sizable effusion is seen. No bony abnormality is noted. IMPRESSION: No acute abnormality seen. Electronically Signed   By: Inez Catalina M.D.   On: 03/08/2021 11:15       Subjective: Patient is feeling better, no nausea or vomiting. No dyspnea or chest pain. Continue to be very weak and deconditioned, not back to his baseline.   Discharge Exam: Vitals:   03/17/21 0620 03/17/21 0914  BP: (!) 127/92   Pulse: 64   Resp: 18   Temp: 98.1 F (36.7 C)   SpO2:  93%   Vitals:   03/16/21 1721 03/17/21 0059 03/17/21 0620 03/17/21 0914  BP: (!) 140/94 127/89 (!) 127/92   Pulse: 81 66 64   Resp: 19 18 18    Temp: 98.2 F (36.8 C) 97.7 F (36.5 C) 98.1 F (36.7 C)   TempSrc: Oral Oral Oral   SpO2: 99% 99%  93%  Weight:      Height:        General: Not in pain or dyspnea deconditioned  Neurology: Awake and alert, non focal  E ENT: no pallor, no icterus, oral mucosa moist Cardiovascular: No JVD. S1-S2 present, rhythmic, no gallops, rubs, or murmurs. No lower extremity edema. Pulmonary: positive breath sounds bilaterally, with no wheezing, rhonchi or rales. Gastrointestinal. Abdomen soft and non tender Skin. No rashes Musculoskeletal: no joint deformities   The results of significant diagnostics from this hospitalization (including imaging, microbiology, ancillary and laboratory) are listed below for reference.     Microbiology: Recent Results (from the past 240 hour(s))  Urine culture     Status: Abnormal   Collection Time: 03/08/21 10:40 AM   Specimen: In/Out Cath Urine  Result Value Ref Range Status   Specimen Description IN/OUT CATH URINE  Final   Special Requests   Final    NONE Performed at [facility], [address] [place], [place] [address]     Culture MULTIPLE SPECIES PRESENT, SUGGEST RECOLLECTION (A)  Final   Report Status 03/09/2021 FINAL  Final  Blood Culture (routine x 2)     Status: None   Collection Time: 03/08/21 10:50 AM   Specimen: BLOOD LEFT FOREARM  Result Value Ref Range Status   Specimen Description BLOOD LEFT FOREARM  Final   Special Requests   Final    BOTTLES DRAWN AEROBIC AND ANAEROBIC Blood Culture adequate volume   Culture   Final    NO GROWTH 5 DAYS Performed at [facility], [place] [address], [place], [place] [address]    Report Status 03/13/2021 FINAL  Final  Blood Culture (routine x 2)  Status: None   Collection Time: 03/08/21 10:51 AM   Specimen: BLOOD  Result Value Ref Range Status   Specimen Description BLOOD SITE NOT SPECIFIED  Final   Special Requests   Final    BOTTLES DRAWN AEROBIC AND ANAEROBIC Blood Culture adequate volume   Culture   Final    NO GROWTH 5 DAYS Performed at [facility], [address] [place], [place] [address]    Report Status 03/13/2021 FINAL  Final  Resp Panel by RT-PCR (Flu A&B, Covid) Nasopharyngeal Swab     Status: None   Collection Time: 03/08/21 12:13 PM   Specimen: Nasopharyngeal Swab; Nasopharyngeal(NP) swabs in vial transport medium  Result Value Ref Range Status   SARS Coronavirus 2 by RT PCR NEGATIVE NEGATIVE Final    Comment: (NOTE) SARS-CoV-2 target nucleic acids are NOT DETECTED.  The SARS-CoV-2 RNA is generally detectable in upper respiratory specimens during the acute phase of infection. The lowest concentration of SARS-CoV-2 viral copies this assay can detect is 138 copies/mL. A negative result does not preclude SARS-Cov-2 infection and should not be used as the sole basis for treatment or other patient management decisions. A negative result may occur with  improper specimen collection/handling, submission of specimen other than nasopharyngeal swab, presence of viral mutation(s) within the areas targeted by this assay, and  inadequate number of viral copies(<138 copies/mL). A negative result must be combined with clinical observations, patient history, and epidemiological information. The expected result is Negative.  Fact Sheet for Patients:  EntrepreneurPulse.com.au  Fact Sheet for Healthcare Providers:  IncredibleEmployment.be  This test is no t yet approved or cleared by the [place] FDA and  has been authorized for detection and/or diagnosis of SARS-CoV-2 by FDA under an Emergency Use Authorization (EUA). This EUA will remain  in effect (meaning this test can be used) for the duration of the COVID-19 declaration under Section 564(b)(1) of the Act, 21 U.S.C.section 360bbb-3(b)(1), unless the authorization is terminated  or revoked sooner.       Influenza A by PCR NEGATIVE NEGATIVE Final   Influenza B by PCR NEGATIVE NEGATIVE Final    Comment: (NOTE) The Xpert Xpress SARS-CoV-2/FLU/RSV plus assay is intended as an aid in the diagnosis of influenza from Nasopharyngeal swab specimens and should not be used as a sole basis for treatment. Nasal washings and aspirates are unacceptable for Xpert Xpress SARS-CoV-2/FLU/RSV testing.  Fact Sheet for Patients: EntrepreneurPulse.com.au  Fact Sheet for Healthcare Providers: IncredibleEmployment.be  This test is not yet approved or cleared by the [place] FDA and has been authorized for detection and/or diagnosis of SARS-CoV-2 by FDA under an Emergency Use Authorization (EUA). This EUA will remain in effect (meaning this test can be used) for the duration of the COVID-19 declaration under Section 564(b)(1) of the Act, 21 U.S.C. section 360bbb-3(b)(1), unless the authorization is terminated or revoked.  Performed at [facility], [place] [address] [place], [place] [address]      Labs: BNP (last 3 results) Recent Labs    12/02/20 1953  BNP 67.8   Basic  Metabolic Panel: Recent Labs  Lab 03/11/21 0337 03/12/21 0633  NA 143 141  K 3.5 3.7  CL 112* 110  CO2 25 25  GLUCOSE 94 89  BUN 14 17  CREATININE 0.98 0.95  CALCIUM 8.9 9.3  MG 1.9  --   PHOS 2.7  --    Liver Function Tests: Recent Labs  Lab 03/12/21 0633  AST 24  ALT 30  ALKPHOS 46  BILITOT 0.6  PROT 5.3*  ALBUMIN 2.7*   No results for input(s): LIPASE, AMYLASE in the last 168 hours. No results for input(s): AMMONIA in the last 168 hours. CBC: Recent Labs  Lab 03/15/21 0205  WBC 4.8  HGB 12.9*  HCT 39.1  MCV 101.6*  PLT 118*   Cardiac Enzymes: Recent Labs  Lab 03/12/21 0633  CKTOTAL 135   BNP: Invalid input(s): POCBNP CBG: Recent Labs  Lab 03/12/21 1700  GLUCAP 119*   D-Dimer No results for input(s): DDIMER in the last 72 hours. Hgb A1c No results for input(s): HGBA1C in the last 72 hours. Lipid Profile No results for input(s): CHOL, HDL, LDLCALC, TRIG, CHOLHDL, LDLDIRECT in the last 72 hours. Thyroid function studies No results for input(s): TSH, T4TOTAL, T3FREE, THYROIDAB in the last 72 hours.  Invalid input(s): FREET3 Anemia work up No results for input(s): VITAMINB12, FOLATE, FERRITIN, TIBC, IRON, RETICCTPCT in the last 72 hours. Urinalysis    Component Value Date/Time   COLORURINE AMBER (A) 03/08/2021 1622   APPEARANCEUR HAZY (A) 03/08/2021 1622   LABSPEC 1.026 03/08/2021 1622   PHURINE 5.0 03/08/2021 1622   GLUCOSEU NEGATIVE 03/08/2021 1622   GLUCOSEU NEG mg/dL 10/11/2006 2016   HGBUR NEGATIVE 03/08/2021 1622   BILIRUBINUR NEGATIVE 03/08/2021 1622   KETONESUR 20 (A) 03/08/2021 1622   PROTEINUR 30 (A) 03/08/2021 1622   UROBILINOGEN 1 10/11/2006 2016   NITRITE NEGATIVE 03/08/2021 1622   LEUKOCYTESUR NEGATIVE 03/08/2021 1622   Sepsis Labs Invalid input(s): PROCALCITONIN,  WBC,  LACTICIDVEN Microbiology Recent Results (from the past 240 hour(s))  Urine culture     Status: Abnormal   Collection Time: 03/08/21 10:40 AM    Specimen: In/Out Cath Urine  Result Value Ref Range Status   Specimen Description IN/OUT CATH URINE  Final   Special Requests   Final    NONE Performed at [facility], [place] [address] [place], [place] [address]    Culture MULTIPLE SPECIES PRESENT, SUGGEST RECOLLECTION (A)  Final   Report Status 03/09/2021 FINAL  Final  Blood Culture (routine x 2)     Status: None   Collection Time: 03/08/21 10:50 AM   Specimen: BLOOD LEFT FOREARM  Result Value Ref Range Status   Specimen Description BLOOD LEFT FOREARM  Final   Special Requests   Final    BOTTLES DRAWN AEROBIC AND ANAEROBIC Blood Culture adequate volume   Culture   Final    NO GROWTH 5 DAYS Performed at [facility], [place] [address] [place], [place] [address]    Report Status 03/13/2021 FINAL  Final  Blood Culture (routine x 2)     Status: None   Collection Time: 03/08/21 10:51 AM   Specimen: BLOOD  Result Value Ref Range Status   Specimen Description BLOOD SITE NOT SPECIFIED  Final   Special Requests   Final    BOTTLES DRAWN AEROBIC AND ANAEROBIC Blood Culture adequate volume   Culture   Final    NO GROWTH 5 DAYS Performed at [facility], [place] [address], [place],  [address]    Report Status 03/13/2021 FINAL  Final  Resp Panel by RT-PCR (Flu A&B, Covid) Nasopharyngeal Swab     Status: None   Collection Time: 03/08/21 12:13 PM   Specimen: Nasopharyngeal Swab; Nasopharyngeal(NP) swabs in vial transport medium  Result Value Ref Range Status   SARS Coronavirus 2 by RT PCR NEGATIVE NEGATIVE Final  Comment: (NOTE) SARS-CoV-2 target nucleic acids are NOT DETECTED.  The SARS-CoV-2 RNA is generally detectable in upper respiratory specimens during the acute phase of infection. The lowest concentration of SARS-CoV-2 viral copies this assay can detect is 138 copies/mL. A negative result does not preclude SARS-Cov-2 infection and should not be used as the sole basis for treatment or other patient  management decisions. A negative result may occur with  improper specimen collection/handling, submission of specimen other than nasopharyngeal swab, presence of viral mutation(s) within the areas targeted by this assay, and inadequate number of viral copies(<138 copies/mL). A negative result must be combined with clinical observations, patient history, and epidemiological information. The expected result is Negative.  Fact Sheet for Patients:  EntrepreneurPulse.com.au  Fact Sheet for Healthcare Providers:  IncredibleEmployment.be  This test is no t yet approved or cleared by the [place] FDA and  has been authorized for detection and/or diagnosis of SARS-CoV-2 by FDA under an Emergency Use Authorization (EUA). This EUA will remain  in effect (meaning this test can be used) for the duration of the COVID-19 declaration under Section 564(b)(1) of the Act, 21 U.S.C.section 360bbb-3(b)(1), unless the authorization is terminated  or revoked sooner.       Influenza A by PCR NEGATIVE NEGATIVE Final   Influenza B by PCR NEGATIVE NEGATIVE Final    Comment: (NOTE) The Xpert Xpress SARS-CoV-2/FLU/RSV plus assay is intended as an aid in the diagnosis of influenza from Nasopharyngeal swab specimens and should not be used as a sole basis for treatment. Nasal washings and aspirates are unacceptable for Xpert Xpress SARS-CoV-2/FLU/RSV testing.  Fact Sheet for Patients: EntrepreneurPulse.com.au  Fact Sheet for Healthcare Providers: IncredibleEmployment.be  This test is not yet approved or cleared by the [place] FDA and has been authorized for detection and/or diagnosis of SARS-CoV-2 by FDA under an Emergency Use Authorization (EUA). This EUA will remain in effect (meaning this test can be used) for the duration of the COVID-19 declaration under Section 564(b)(1) of the Act, 21 U.S.C. section 360bbb-3(b)(1),  unless the authorization is terminated or revoked.  Performed at [facility], [place] [address] [place], [place] [address]      Time coordinating discharge: 45 minutes  SIGNED:   Tawni Millers, MD  [facility] 03/17/2021, 9:37 AM

## 2021-03-17 NOTE — Plan of Care (Signed)

## 2021-03-18 ENCOUNTER — Other Ambulatory Visit (HOSPITAL_COMMUNITY): Payer: Self-pay

## 2021-03-18 MED ORDER — BICTEGRAVIR-EMTRICITAB-TENOFOV 50-200-25 MG PO TABS
1.0000 | ORAL_TABLET | Freq: Every day | ORAL | 0 refills | Status: DC
  Filled 2021-03-18: qty 30, 30d supply, fill #0

## 2021-03-18 NOTE — Progress Notes (Signed)
Patient was requesting to leave the floor with AMA. Patient is alert and oriented x 4 and able to make decision.RN explained about the risk.  Notified the MD. Patient sign the AMA paper and place in the chart.

## 2021-03-18 NOTE — Progress Notes (Addendum)
PROGRESS NOTE    [name] Herring  QMV:[id number] DOB: [birth year] DOA: 03/08/2021 PCP: Sonia Side., FNP   Brief Narrative:  [name] was admitted to the hospital with rhabdomyolysis and hyponatremia in the setting of dehydration.  Assessment & Plan:   Principal Problem:   Dehydration Active Problems:   Human immunodeficiency virus (HIV) disease (HCC)   Hypothyroidism   Depression   Alcohol abuse   HTN (hypertension)   Tobacco abuse   Emphysema lung (HCC)   AKI (acute kidney injury) ([place])   History of pulmonary embolism   Schizophrenia (HCC)   Hypernatremia   Rhabdomyolysis   Protein-calorie malnutrition, severe   Failure to thrive in adult   Acute kidney injury due to acute dehydration, complicated by hypernatremia and rhabdomyolysis. Patient was admitted to the medical ward, he received intravenous fluids and supportive medical therapy.  His kidney function and electrolytes improved.   At the time of discharge he is tolerating p.o. diet adequately.  His discharge sodium 141, potassium 3.7, chloride 110, bicarb 25, glucose 89, BUN 17, creatinine 0.95. CK 137.   2.  HIV, severe protein calorie malnutrition, alcohol abuse, thrombocytopenia and macrocytosis.  Patient was resumed on his antiretroviral therapy, Biktarvy.  Received nutritional supplements. No signs of alcohol withdrawal during his hospitalization.  Continue oral protein supplementation   3.  Gastritis.  Patient with persistent dyspepsia symptoms, he was placed on aggressive antiacid therapy with good toleration. Continue PO Protonix 40 mg twice daily.  Continue Carafate.   4.  Schizophrenia/tobacco abuse.  Patient with no agitation during his hospitalization, continue Seroquel, risperidone and mirtazapine, velbenazine.  Patient also on benztropine and trazodone. Continue nicotine patch.   5.  History of DVT/PE.  Continue anticoagulation with apixaban.   6.  COPD.  No clinical signs of acute  exacerbation. Continue with budesonide and formoterol.   7.  Hypertension/dyslipidemia.  His blood pressure remained well controlled with amlodipine and losartan, discontinued clonidine and lisinopril. Continue statin therapy.   8. Hx of hypothyroid TSH was within normal range. Patient not on levothyroxine    DVT prophylaxis: Apixaban Code Status: Full code Family Communication:  Disposition:   Status is: Inpatient  Remains inpatient appropriate because:Unsafe d/c plan  Dispo: The patient is from: Home              Anticipated d/c is to: SNF              Patient currently is medically stable to d/c.   Difficult to place patient No       Consultants:  None  Procedures:  None  Antimicrobials: None   Subjective: No acute events reported by nursing staff.  Patient seen and examined bedside today.  Doing well.  No complaints.  He eating and drinking okay.  Having timely bowel movements.  Objective: Vitals:   03/17/21 1150 03/17/21 2011 03/18/21 0055 03/18/21 0541  BP: 133/78  121/85 (!) 136/91  Pulse: 74  63 61  Resp: 18  19 18   Temp: 98 F (36.7 C)  97.8 F (36.6 C) 97.6 F (36.4 C)  TempSrc: Oral  Oral Oral  SpO2: 97% 96% 96% 98%  Weight:      Height:        Intake/Output Summary (Last 24 hours) at 03/18/2021 1403 Last data filed at 03/18/2021 1031 Gross per 24 hour  Intake 240 ml  Output 1080 ml  Net -840 ml   Filed Weights   03/08/21 1038 03/08/21 2220  Weight: 58 kg 54.2 kg    Examination:  General exam: Appears calm and comfortable  Respiratory system: Clear to auscultation. Respiratory effort normal. Cardiovascular system: S1 & S2 heard, RRR. No JVD, murmurs, rubs, gallops or clicks. No pedal edema. Gastrointestinal system: Abdomen is nondistended, soft and nontender. No organomegaly or masses felt. Normal bowel sounds heard. Central nervous system: Alert and oriented. No focal neurological deficits. Extremities: Symmetric 5 x 5 power. Skin:  No rashes, lesions or ulcers Psychiatry: Judgement and insight appear normal. Mood & affect appropriate.     Data Reviewed: I have personally reviewed following labs and imaging studies  CBC: Recent Labs  Lab 03/15/21 0205  WBC 4.8  HGB 12.9*  HCT 39.1  MCV 101.6*  PLT 118*    Basic Metabolic Panel: Recent Labs  Lab 03/12/21 0633  NA 141  K 3.7  CL 110  CO2 25  GLUCOSE 89  BUN 17  CREATININE 0.95  CALCIUM 9.3    GFR: Estimated Creatinine Clearance: 64.2 mL/min (by C-G formula based on SCr of 0.95 mg/dL).  Liver Function Tests: Recent Labs  Lab 03/12/21 0633  AST 24  ALT 30  ALKPHOS 46  BILITOT 0.6  PROT 5.3*  ALBUMIN 2.7*    CBG: Recent Labs  Lab 03/12/21 1700  GLUCAP 119*     Recent Results (from the past 240 hour(s))  SARS CORONAVIRUS 2 (TAT 6-24 HRS) Nasopharyngeal Nasopharyngeal Swab     Status: None   Collection Time: 03/17/21 12:34 PM   Specimen: Nasopharyngeal Swab  Result Value Ref Range Status   SARS Coronavirus 2 NEGATIVE NEGATIVE Final    Comment: (NOTE) SARS-CoV-2 target nucleic acids are NOT DETECTED.  The SARS-CoV-2 RNA is generally detectable in upper and lower respiratory specimens during the acute phase of infection. Negative results do not preclude SARS-CoV-2 infection, do not rule out co-infections with other pathogens, and should not be used as the sole basis for treatment or other patient management decisions. Negative results must be combined with clinical observations, patient history, and epidemiological information. The expected result is Negative.  Fact Sheet for Patients: SugarRoll.be  Fact Sheet for Healthcare Providers: https://www.woods-mathews.com/  This test is not yet approved or cleared by the [place] FDA and  has been authorized for detection and/or diagnosis of SARS-CoV-2 by FDA under an Emergency Use Authorization (EUA). This EUA will remain  in effect  (meaning this test can be used) for the duration of the COVID-19 declaration under Se ction 564(b)(1) of the Act, 21 U.S.C. section 360bbb-3(b)(1), unless the authorization is terminated or revoked sooner.  Performed at [facility], [place] [address], [place],  [address]          Radiology Studies: No results found.      Scheduled Meds:  amLODipine  5 mg Oral Daily   apixaban  5 mg Oral BID   atorvastatin  40 mg Oral QHS   benztropine  1 mg Oral BID   bictegravir-emtricitabine-tenofovir AF  1 tablet Oral Daily   feeding supplement  237 mL Oral TID BM   folic acid  1 mg Oral Daily   gabapentin  600 mg Oral QHS   losartan  25 mg Oral Daily   mirtazapine  15 mg Oral QHS   mometasone-formoterol  2 puff Inhalation BID   montelukast  10 mg Oral Daily   multivitamin with minerals  1 tablet Oral Daily   mupirocin ointment   Topical BID   nicotine  14 mg Transdermal Daily   pantoprazole  40 mg Oral BID   QUEtiapine  25 mg Oral QHS   risperiDONE  2 mg Oral BID   sucralfate  1 g Oral BID AC   thiamine  100 mg Oral Daily   valbenazine  80 mg Oral QHS     LOS: 10 days    Time spent: 35 minutes    Leslee Home, MD [facility]   To contact the attending provider between 7A-7P or the covering provider during after hours 7P-7A, please log into the web site [email or web address] and access using universal [place] password for that web site. If you do not have the password, please call the hospital operator.  03/18/2021, 2:03 PM

## 2021-03-18 NOTE — Progress Notes (Signed)
CSW spoke with Helene Kelp at [facility] who states she is coming to visit this patient shortly. Helene Kelp states that patient will need to supply his own HIV medications for his stay at the facility.  Madilyn Fireman, MSW, LCSW Transitions of Care  Clinical Social Worker II [phone]

## 2021-03-18 NOTE — Progress Notes (Signed)
Patient removed his iv. Patient refused to insert the new iv.

## 2021-03-18 NOTE — Progress Notes (Signed)
Physical Therapy Treatment Patient Details Name: [name] Herring MRN: [id number] DOB: 29-Sep-1960 Today's Date: 03/18/2021    History of Present Illness Pt is a 60 year old man admitted 03/09/21 from home where he was living in squalor. + dehydration, rhamdomyolosis, AKI, hypernatremia. PMH: HIV, hypothyroidism, depression, alcohol and tobacco dependence, emphysema, personality disorder, schizophrenia.    PT Comments    Pt agreeable to work with therapy. Pt states he is steady enough to walk without RW. As with prior sessions pt is limited in safe mobility by decreased knowledge of deficits, as well as decreased strength and endurance. Pt is mod I for bed mobility, min guard for transfers and ultimately min A for ambulation. Worked on hip strengthening exercise to improve BoS with ambulation. Pt voices his frustration with being in hospital so long and eludes to leaving AMA if he is not d/c'd soon. Tried to educate pt on plans for short term SNF, but pt reports he is going home with his daughter.     Follow Up Recommendations  SNF     Equipment Recommendations  None recommended by PT    Recommendations for Other Services       Precautions / Restrictions Precautions Precautions: Fall Restrictions Weight Bearing Restrictions: No    Mobility  Bed Mobility Overal bed mobility: Modified Independent             General bed mobility comments: increased effort to bring trunk to upright    Transfers Overall transfer level: Needs assistance Equipment used: Rolling walker (2 wheeled) Transfers: Sit to/from Stand Sit to Stand: Min guard         General transfer comment: min guard for safety  Ambulation/Gait Ambulation/Gait assistance: Min guard;Min assist Gait Distance (Feet): 80 Feet Assistive device: None Gait Pattern/deviations: Trunk flexed Gait velocity: progressively slower Gait velocity interpretation: <1.31 ft/sec, indicative of household ambulator General Gait  Details: similar to prior session pt starts off strong with upright posture and good base of support and as he progresses he gets weaker with increased trunk, hip and knee flexion. trying to increase speed to get back to room more quickly ultimately needing min A for support       Balance Overall balance assessment: Needs assistance   Sitting balance-Leahy Scale: Good     Standing balance support: Bilateral upper extremity supported Standing balance-Leahy Scale: Fair Standing balance comment: requires UE support for dynamic activity                            Cognition Arousal/Alertness: Awake/alert Behavior During Therapy: Restless Overall Cognitive Status: Impaired/Different from baseline Area of Impairment: Safety/judgement;Awareness;Problem solving;Following commands                       Following Commands: Follows multi-step commands inconsistently Safety/Judgement: Decreased awareness of safety;Decreased awareness of deficits   Problem Solving: Requires verbal cues;Requires tactile cues;Decreased initiation General Comments: pt is getting restless about being in hospital so long. Pt states "I'm a grown man and I can leave if I want"      Exercises General Exercises - Lower Extremity Hip ABduction/ADduction: AROM;Both;10 reps;Standing    General Comments General comments (skin integrity, edema, etc.): Pt with 3/4 DoE with ambulation, not present in prior sessions      Pertinent Vitals/Pain Pain Assessment: No/denies pain     PT Goals (current goals can now be found in the care plan section) Acute Rehab PT Goals  Patient Stated Goal: to feel better PT Goal Formulation: With family Time For Goal Achievement: 03/24/21 Potential to Achieve Goals: Fair Progress towards PT goals: Progressing toward goals (slowly)    Frequency    Min 3X/week      PT Plan Current plan remains appropriate       AM-PAC PT "6 Clicks" Mobility   Outcome  Measure  Help needed turning from your back to your side while in a flat bed without using bedrails?: None Help needed moving from lying on your back to sitting on the side of a flat bed without using bedrails?: None Help needed moving to and from a bed to a chair (including a wheelchair)?: A Little Help needed standing up from a chair using your arms (e.g., wheelchair or bedside chair)?: A Little Help needed to walk in hospital room?: A Little Help needed climbing 3-5 steps with a railing? : A Lot 6 Click Score: 19    End of Session Equipment Utilized During Treatment: Gait belt Activity Tolerance: Patient tolerated treatment well Patient left: in bed;with bed alarm set;with call bell/phone within reach Nurse Communication: Mobility status PT Visit Diagnosis: Unsteadiness on feet (R26.81);Muscle weakness (generalized) (M62.81);Difficulty in walking, not elsewhere classified (R26.2)     Time: 6578-4696 PT Time Calculation (min) (ACUTE ONLY): 21 min  Charges:  $Therapeutic Exercise: 8-22 mins                     Kaylyn Garrow B. Migdalia Dk PT, DPT Acute Rehabilitation Services Pager [phone] Office [phone]    Ethel 03/18/2021, 4:06 PM

## 2021-03-18 NOTE — TOC Progression Note (Signed)
Transition of Care [facility]) - Progression Note    Patient Details  Name: [name] Herring MRN: [id number] Date of Birth: [birth year]  Transition of Care [facility]) CM/SW Contact  Curlene Labrum, RN Phone Number: 03/18/2021, 2:12 PM  Clinical Narrative:    CM spoke with [name] Herring, MSW and the patient is being considered for admission at [facility] as long as the patient can bring his HIV medication from home.  [name] Herring, pharmacist with TOC is unable to fill the medication until 03/26/2021.  I called and spoke with the daughter, [name] Herring, on the phone and she is going to bring the medication to the hospital this weekend.  Bedside nursing was alerted and I asked that they send the medication to the main pharmacy at [facility] and hold for the patient.  CM and MSW will continue to follow the patient for SNF admission - pending admission to [facility].   Expected Discharge Plan: [place] Barriers to Discharge: Continued Medical Work up  Expected Discharge Plan and Services Expected Discharge Plan: [place] Choice: [place] arrangements for the past 2 months: Apartment Expected Discharge Date: 03/17/21                                     Social Determinants of Health (SDOH) Interventions    Readmission Risk Interventions No flowsheet data found.

## 2021-03-19 NOTE — Discharge Summary (Signed)
Physician Discharge Summary  [name] Herring ZCH:[id number] DOB: 08-29-1961 DOA: 03/08/2021  PCP: [name] Side., FNP  Admit date: 03/08/2021 Discharge date: 03/19/2021  Admitted From: Home Disposition: Left AMA   Discharge Condition:Left [place] CODE STATUS:Full   Discharge summary: [name] Herring was admitted to the hospital with the working diagnosis of rhabdomyolysis and hyponatremia in the setting of dehydration.    60 year old male past medical history for HIV, hypertension, PE/DVT, status post IVC filter 2018, emphysema, depression, tobacco abuse, schizophrenia, alcohol abuse, personality disorder and gastric ulcers who presented 3 days after mechanical fall.  Reported being dizzy, no loss of consciousness or evident head trauma.  Apparently not eating for about 2 days prior to hospitalization.  He had a recent hospitalization at behavioral health due to hallucinations and suicidal ideations. On his initial physical examination blood pressure 101/81, heart rate 69, respiratory rate 26, oxygen saturation 96%, he was cachectic, dry mucous membranes, lungs with no wheezing or rhonchi, heart S1-S2, present, rhythmic, abdomen soft, skin with multiple lesions and lacerations over the right arm.   Sodium 154, potassium 4.1, chloride 121, bicarb 22, glucose 141, BUN 59, creatinine 2.74, CK 2185, white cell count 6.8, hemoglobin 15.5, hematocrit 48.4, platelets 126. SARS COVID-19 negative.   Urinalysis specific gravity 1.026, 30 protein.  0-5 white cells.   Head neck CT no acute changes, negative cervical spine fracture. Chest radiograph with no infiltrates, positive hyperinflation.   EKG 83 bpm, normal axis, normal intervals, sinus rhythm, no ST segment or T wave changes, positive LVH.   Chest radiograph with hyperinflation, no infiltrates.     Patient was placed on intravenous fluids with good toleration, his AKI and hypernatremia resolved.   He has been placed on aggressive antiacid  therapy for suspected gastritis.   He was evaluated by physical therapy/ occupational therapy, recommendations to continue care at skilled nursing facility, but the patient ended up leaving against medical advice before placement to SNF. Risks discussed with patient. He understood those risks and left AMA. Signed papers. IV removed.   Acute kidney injury due to acute dehydration, complicated by hypernatremia and rhabdomyolysis. Patient was admitted to the medical ward, he received intravenous fluids and supportive medical therapy.  His kidney function and electrolytes improved.   At the time of discharge he is tolerating p.o. diet adequately.  His discharge sodium 141, potassium 3.7, chloride 110, bicarb 25, glucose 89, BUN 17, creatinine 0.95. CK 137.   2.  HIV, severe protein calorie malnutrition, alcohol abuse, thrombocytopenia and macrocytosis.  Patient was resumed on his antiretroviral therapy.  Received nutritional supplements. No signs of alcohol withdrawal during his hospitalization.   3.  Gastritis.  Patient with persistent dyspepsia symptoms, he was placed on aggressive antiacid therapy with good toleration. Continue proton pump inhibitors at discharge.   4.  Schizophrenia/tobacco abuse.  Patient with no agitation during his hospitalization, continue Seroquel, risperidone and mirtazapine, velbenazine.  Patient also on benztropine and trazodone. Continue nicotine patch.   5.  History of DVT/PE.  Continued anticoagulation with apixaban.   6.  COPD.  No clinical signs of acute exacerbation. Continued with budesonide and formoterol.   7.  Hypertension/dyslipidemia.  His blood pressure remained well controlled with amlodipine and losartan, discontinued clonidine and lisinopril. Continued statin therapy.   8. Hx of hypothyroid TSH was within normal range. Patient not on levothyroxine    Discharge Diagnoses:  Principal Problem:   Dehydration Active Problems:   Human  immunodeficiency virus (HIV) disease ([place])  Hypothyroidism   Depression   Alcohol abuse   HTN (hypertension)   Tobacco abuse   Emphysema lung (HCC)   AKI (acute kidney injury) ([place])   History of pulmonary embolism   Schizophrenia (HCC)   Hypernatremia   Rhabdomyolysis   Protein-calorie malnutrition, severe   Failure to thrive in adult    Discharge Instructions  Discharge Instructions     Diet - low sodium heart healthy   Complete by: As directed    Discharge instructions   Complete by: As directed    Please follow with primary care in 7 to 10 days.   Discharge wound care:   Complete by: As directed    Every shift, apply mupirocin ointment to abrasions on the right arm, shoulder and left leg.  Cover with nonadherent dressing and secure with foam dressing on the shoulder and leg, wrapped the arm with Kerlix.  Change twice daily.   Increase activity slowly   Complete by: As directed       Allergies as of 03/18/2021       Reactions   Amoxicillin Shortness Of Breath, Rash   Has patient had a PCN reaction causing immediate rash, facial/tongue/throat swelling, [name] Has patient had a PCN reaction causing severe rash involving mucus membranes or skin necrosis: no Has patient had a PCN reaction that required hospitalization: yes drs office visit Has patient had a PCN reaction occurring within the last 10 years: no If all of the above answers are "NO", then may proceed with Cephalosporin use.   Latex Shortness Of Breath   Abilify [aripiprazole] Other (See Comments)   Double vision        Medication List     STOP taking these medications    cloNIDine 0.2 MG tablet Commonly known as: CATAPRES   hydrOXYzine 25 MG tablet Commonly known as: ATARAX/VISTARIL   lisinopril 40 MG tablet Commonly known as: ZESTRIL   metoprolol tartrate 25 MG tablet Commonly known as: LOPRESSOR       TAKE these medications    acetaminophen 500  MG tablet Commonly known as: TYLENOL Take 1 tablet (500 mg total) by mouth every 6 (six) hours as needed for moderate pain.   amLODipine 5 MG tablet Commonly known as: NORVASC Take 1 tablet (5 mg total) by mouth daily. What changed:  medication strength how much to take   apixaban 5 MG Tabs tablet Commonly known as: Eliquis Take 1 tablet (5 mg total) by mouth 2 (two) times daily.   atorvastatin 40 MG tablet Commonly known as: LIPITOR Take 40 mg by mouth at bedtime.   benztropine 1 MG tablet Commonly known as: COGENTIN Take 1 tablet (1 mg total) by mouth 2 (two) times daily. For eps   bictegravir-emtricitabine-tenofovir AF 50-200-25 MG Tabs tablet Commonly known as: BIKTARVY Take 1 tablet by mouth daily.   feeding supplement Liqd Take 237 mLs by mouth 3 (three) times daily between meals.   fluticasone 50 MCG/ACT nasal spray Commonly known as: FLONASE Place 1-2 sprays into both nostrils daily as needed for rhinitis.   gabapentin 600 MG tablet Commonly known as: NEURONTIN Take 1 tablet (600 mg total) by mouth at bedtime. What changed:  medication strength how much to take additional instructions   losartan 25 MG tablet Commonly known as: COZAAR Take 1 tablet (25 mg total) by mouth daily.   mirtazapine 15 MG tablet Commonly known as: REMERON Take 1 tablet (15 mg total) by mouth at bedtime. What  changed:  medication strength how much to take additional instructions   montelukast 10 MG tablet Commonly known as: SINGULAIR Take 10 mg by mouth daily.   multivitamin with minerals Tabs tablet Take 1 tablet by mouth daily.   nicotine 14 mg/24hr patch Commonly known as: NICODERM CQ - dosed in mg/24 hours Place 1 patch (14 mg total) onto the skin daily.   pantoprazole 40 MG tablet Commonly known as: PROTONIX Take 1 tablet (40 mg total) by mouth 2 (two) times daily.   QUEtiapine 25 MG tablet Commonly known as: SEROQUEL Take 1 tablet (25 mg total) by mouth at  bedtime. For agitation   risperiDONE 2 MG tablet Commonly known as: RISPERDAL Take 1 tablet (2 mg total) by mouth 2 (two) times daily. For mood control   Symbicort 160-4.5 MCG/ACT inhaler Generic drug: budesonide-formoterol Inhale 2 puffs into the lungs 2 (two) times daily.   thiamine 100 MG tablet Take 1 tablet (100 mg total) by mouth daily.   traZODone 50 MG tablet Commonly known as: DESYREL Take 1 tablet (50 mg total) by mouth at bedtime as needed for sleep.   valbenazine 80 MG capsule Commonly known as: Ingrezza Take 1 capsule (80 mg total) by mouth at bedtime. For tardive dyskinesia               Discharge Care Instructions  (From admission, onward)           Start     Ordered   03/17/21 0000  Discharge wound care:       Comments: Every shift, apply mupirocin ointment to abrasions on the right arm, shoulder and left leg.  Cover with nonadherent dressing and secure with foam dressing on the shoulder and leg, wrapped the arm with Kerlix.  Change twice daily.   03/17/21 0948            Allergies  Allergen Reactions   Amoxicillin Shortness Of Breath and Rash    Has patient had a PCN reaction causing immediate rash, facial/tongue/throat swelling, [name] Has patient had a PCN reaction causing severe rash involving mucus membranes or skin necrosis: no Has patient had a PCN reaction that required hospitalization: yes drs office visit Has patient had a PCN reaction occurring within the last 10 years: no If all of the above answers are "NO", then may proceed with Cephalosporin use.    Latex Shortness Of Breath   Abilify [Aripiprazole] Other (See Comments)    Double vision    Consultations: None   Procedures/Studies: CT Head Wo Contrast  Result Date: 03/08/2021 CLINICAL DATA:  Head trauma failure to thrive EXAM: CT HEAD WITHOUT CONTRAST CT CERVICAL SPINE WITHOUT CONTRAST TECHNIQUE: Multidetector CT imaging of the head  and cervical spine was performed following the standard protocol without intravenous contrast. Multiplanar CT image reconstructions of the cervical spine were also generated. COMPARISON:  CT head 10/29/2020 FINDINGS: CT HEAD FINDINGS Brain: Generalized atrophy without hydrocephalus. Mild white matter hypodensity bilaterally, stable from prior. Negative for acute infarct, hemorrhage, mass Vascular: Negative for hyperdense vessel Skull: Negative Sinuses/Orbits: Cyst left maxillary sinus.  Negative orbit Other: None CT CERVICAL SPINE FINDINGS Alignment: Slight retrolisthesis at C4-5. Straightening of the cervical lordosis Skull base and vertebrae: Negative for fracture Soft tissues and spinal canal: Negative Disc levels: Mild disc degeneration and spurring most prominent at C4-5. Upper chest: Severe apical emphysema without acute abnormality. Other: None IMPRESSION: 1. No acute intracranial abnormality. Mild atrophy and mild chronic microvascular ischemia  2. Negative for cervical spine fracture 3. Severe emphysema Electronically Signed   By: Franchot Gallo M.D.   On: 03/08/2021 13:19   CT Cervical Spine Wo Contrast  Result Date: 03/08/2021 CLINICAL DATA:  Head trauma failure to thrive EXAM: CT HEAD WITHOUT CONTRAST CT CERVICAL SPINE WITHOUT CONTRAST TECHNIQUE: Multidetector CT imaging of the head and cervical spine was performed following the standard protocol without intravenous contrast. Multiplanar CT image reconstructions of the cervical spine were also generated. COMPARISON:  CT head 10/29/2020 FINDINGS: CT HEAD FINDINGS Brain: Generalized atrophy without hydrocephalus. Mild white matter hypodensity bilaterally, stable from prior. Negative for acute infarct, hemorrhage, mass Vascular: Negative for hyperdense vessel Skull: Negative Sinuses/Orbits: Cyst left maxillary sinus.  Negative orbit Other: None CT CERVICAL SPINE FINDINGS Alignment: Slight retrolisthesis at C4-5. Straightening of the cervical lordosis  Skull base and vertebrae: Negative for fracture Soft tissues and spinal canal: Negative Disc levels: Mild disc degeneration and spurring most prominent at C4-5. Upper chest: Severe apical emphysema without acute abnormality. Other: None IMPRESSION: 1. No acute intracranial abnormality. Mild atrophy and mild chronic microvascular ischemia 2. Negative for cervical spine fracture 3. Severe emphysema Electronically Signed   By: Franchot Gallo M.D.   On: 03/08/2021 13:19   DG Chest Port 1 View  Result Date: 03/08/2021 CLINICAL DATA:  Cough for 2 days EXAM: PORTABLE CHEST 1 VIEW COMPARISON:  12/02/2020 FINDINGS: Cardiac shadow is stable. Lungs are well aerated bilaterally. No focal infiltrate or sizable effusion is seen. No bony abnormality is noted. IMPRESSION: No acute abnormality seen. Electronically Signed   By: Inez Catalina M.D.   On: 03/08/2021 11:15      Subjective: No acute events reported over night by nursing staff. Resting comfortably in bed. Doing well. Eating and drinking okay. Had a BM in the last 24 hours.  Discharge Exam: Vitals:   03/18/21 0541 03/18/21 1644  BP: (!) 136/91 (!) 132/98  Pulse: 61 84  Resp: 18 18  Temp: 97.6 F (36.4 C) 97.6 F (36.4 C)  SpO2: 98% 94%   Vitals:   03/17/21 2011 03/18/21 0055 03/18/21 0541 03/18/21 1644  BP:  121/85 (!) 136/91 (!) 132/98  Pulse:  63 61 84  Resp:  19 18 18   Temp:  97.8 F (36.6 C) 97.6 F (36.4 C) 97.6 F (36.4 C)  TempSrc:  Oral Oral Oral  SpO2: 96% 96% 98% 94%  Weight:      Height:        General: Pt is alert, awake, not in acute distress Cardiovascular: RRR, S1/S2 +, no rubs, no gallops Respiratory: CTA bilaterally, no wheezing, no rhonchi Abdominal: Soft, NT, ND, bowel sounds + Extremities: no edema, no cyanosis    The results of significant diagnostics from this hospitalization (including imaging, microbiology, ancillary and laboratory) are listed below for reference.     Microbiology: Recent Results (from  the past 240 hour(s))  SARS CORONAVIRUS 2 (TAT 6-24 HRS) Nasopharyngeal Nasopharyngeal Swab     Status: None   Collection Time: 03/17/21 12:34 PM   Specimen: Nasopharyngeal Swab  Result Value Ref Range Status   SARS Coronavirus 2 NEGATIVE NEGATIVE Final    Comment: (NOTE) SARS-CoV-2 target nucleic acids are NOT DETECTED.  The SARS-CoV-2 RNA is generally detectable in upper and lower respiratory specimens during the acute phase of infection. Negative results do not preclude SARS-CoV-2 infection, do not rule out co-infections with other pathogens, and should not be used as the sole basis for treatment or other patient management  decisions. Negative results must be combined with clinical observations, patient history, and epidemiological information. The expected result is Negative.  Fact Sheet for Patients: SugarRoll.be  Fact Sheet for Healthcare Providers: https://www.woods-mathews.com/  This test is not yet approved or cleared by the [place] FDA and  has been authorized for detection and/or diagnosis of SARS-CoV-2 by FDA under an Emergency Use Authorization (EUA). This EUA will remain  in effect (meaning this test can be used) for the duration of the COVID-19 declaration under Se ction 564(b)(1) of the Act, 21 U.S.C. section 360bbb-3(b)(1), unless the authorization is terminated or revoked sooner.  Performed at [facility], [place] [address], [place], [place] [address]      Labs: BNP (last 3 results) Recent Labs    12/02/20 1953  BNP 28.3   Basic Metabolic Panel: No results for input(s): NA, K, CL, CO2, GLUCOSE, BUN, CREATININE, CALCIUM, MG, PHOS in the last 168 hours. Liver Function Tests: No results for input(s): AST, ALT, ALKPHOS, BILITOT, PROT, ALBUMIN in the last 168 hours. No results for input(s): LIPASE, AMYLASE in the last 168 hours. No results for input(s): AMMONIA in the last 168 hours. CBC: Recent Labs   Lab 03/15/21 0205  WBC 4.8  HGB 12.9*  HCT 39.1  MCV 101.6*  PLT 118*   Cardiac Enzymes: No results for input(s): CKTOTAL, CKMB, CKMBINDEX, TROPONINI in the last 168 hours. BNP: Invalid input(s): POCBNP CBG: Recent Labs  Lab 03/12/21 1700  GLUCAP 119*   D-Dimer No results for input(s): DDIMER in the last 72 hours. Hgb A1c No results for input(s): HGBA1C in the last 72 hours. Lipid Profile No results for input(s): CHOL, HDL, LDLCALC, TRIG, CHOLHDL, LDLDIRECT in the last 72 hours. Thyroid function studies No results for input(s): TSH, T4TOTAL, T3FREE, THYROIDAB in the last 72 hours.  Invalid input(s): FREET3 Anemia work up No results for input(s): VITAMINB12, FOLATE, FERRITIN, TIBC, IRON, RETICCTPCT in the last 72 hours. Urinalysis    Component Value Date/Time   COLORURINE AMBER (A) 03/08/2021 1622   APPEARANCEUR HAZY (A) 03/08/2021 1622   LABSPEC 1.026 03/08/2021 1622   PHURINE 5.0 03/08/2021 1622   GLUCOSEU NEGATIVE 03/08/2021 1622   GLUCOSEU NEG mg/dL 10/11/2006 2016   HGBUR NEGATIVE 03/08/2021 1622   BILIRUBINUR NEGATIVE 03/08/2021 1622   KETONESUR 20 (A) 03/08/2021 1622   PROTEINUR 30 (A) 03/08/2021 1622   UROBILINOGEN 1 10/11/2006 2016   NITRITE NEGATIVE 03/08/2021 1622   LEUKOCYTESUR NEGATIVE 03/08/2021 1622   Sepsis Labs Invalid input(s): PROCALCITONIN,  WBC,  LACTICIDVEN Microbiology Recent Results (from the past 240 hour(s))  SARS CORONAVIRUS 2 (TAT 6-24 HRS) Nasopharyngeal Nasopharyngeal Swab     Status: None   Collection Time: 03/17/21 12:34 PM   Specimen: Nasopharyngeal Swab  Result Value Ref Range Status   SARS Coronavirus 2 NEGATIVE NEGATIVE Final    Comment: (NOTE) SARS-CoV-2 target nucleic acids are NOT DETECTED.  The SARS-CoV-2 RNA is generally detectable in upper and lower respiratory specimens during the acute phase of infection. Negative results do not preclude SARS-CoV-2 infection, do not rule out co-infections with other pathogens,  and should not be used as the sole basis for treatment or other patient management decisions. Negative results must be combined with clinical observations, patient history, and epidemiological information. The expected result is Negative.  Fact Sheet for Patients: SugarRoll.be  Fact Sheet for Healthcare Providers: https://www.woods-mathews.com/  This test is not yet approved or cleared by the [place] FDA and  has been authorized for  detection and/or diagnosis of SARS-CoV-2 by FDA under an Emergency Use Authorization (EUA). This EUA will remain  in effect (meaning this test can be used) for the duration of the COVID-19 declaration under Se ction 564(b)(1) of the Act, 21 U.S.C. section 360bbb-3(b)(1), unless the authorization is terminated or revoked sooner.  Performed at [facility], [place] [address] [place], [place] [address]      Time coordinating discharge: 45 minutes  SIGNED:   Leslee Home, MD  [facility] 03/19/2021, 7:14 AM Pager   If 7PM-7AM, please contact night-coverage [email or web address] Password [email or web address]

## 2021-04-01 ENCOUNTER — Other Ambulatory Visit: Payer: Self-pay

## 2021-04-01 ENCOUNTER — Ambulatory Visit: Payer: Medicare Other | Admitting: Infectious Diseases

## 2021-04-01 ENCOUNTER — Ambulatory Visit (HOSPITAL_COMMUNITY)
Admission: RE | Admit: 2021-04-01 | Discharge: 2021-04-01 | Disposition: A | Discharge status: Home / Self Care | Payer: Medicare Other | Attending: Psychiatry | Admitting: Psychiatry

## 2021-04-01 NOTE — H&P (Signed)
Behavioral Health Medical Screening Exam  [name] Herring is an 60 y.o. male who presented voluntarily to [facility] requesting medication refill. Patient states he recently relocated to [place], [place] to live with his daughter and was in town for a medical appointment and needed a refill for his psychiatric medication. Patient states he currently has an ACTT Team located "off of [facility]"; per chart review patient is currently being followed by [facility]. Provider spoke to ACTT team member Abby who stated patient has x1 month of medication in the office available for pick-up before 4:30 pm.   Patient presents alert and oriented; daughter and daughter in law in lobby. Involuntary movements noted throughout body; parkinsonian-like shuffled gait, pill rolling of fingers, lip smacking. Patient does have history of schizophrenia and psychotropic medications. States, "I was up from [place] for a medical appointment and I need my psych meds refilled". He denies any suicidal or homicidal ideations, auditory or visual hallucinations, and does not appear to be responding to any external/internal stimuli at this time. Provider spoke to patient's daughter in lobby who denied any safety concerns and expressed the need for patient to get his refills. Provider gave address to ACTT Team building and explained that his medications were in wait for him at the location.   Total Time spent with patient: 15 minutes  Psychiatric Specialty Exam: Physical Exam Vitals and nursing note reviewed.  Constitutional:      Appearance: He is ill-appearing.  HENT:     Head: Normocephalic.     Nose: Nose normal.  Cardiovascular:     Rate and Rhythm: Normal rate.  Pulmonary:     Effort: Pulmonary effort is normal.  Abdominal:     General: Abdomen is flat.  Neurological:     Mental Status: He is alert.     Coordination: Coordination abnormal.     Gait: Gait abnormal.     Comments: Visible tardive dyskinesia and EPS noted   Psychiatric:        Attention and Perception: Attention normal.        Mood and Affect: Mood and affect normal.        Speech: Speech normal.        Behavior: Behavior normal. Behavior is cooperative.        Thought Content: Thought content normal.        Cognition and Memory: Cognition and memory normal.        Judgment: Judgment normal.   Review of Systems  Psychiatric/Behavioral: Negative.  Negative for hallucinations, self-injury and suicidal ideas.   All other systems reviewed and are negative. Blood pressure (!) 135/99, pulse 81, temperature 97.6 F (36.4 C), temperature source Oral, resp. rate 18, SpO2 97 %.There is no height or weight on file to calculate BMI. General Appearance: Disheveled Eye Contact:  Fair Speech:  Clear and Coherent and Slow Volume:  Normal Mood:  Euthymic Affect:  Appropriate and Congruent Thought Process:  Goal Directed Orientation:  Full (Time, Place, and Person) Thought Content:  Logical Suicidal Thoughts:  No Homicidal Thoughts:  No Memory:  Immediate;   Fair Remote;   Fair Judgement:  Intact Insight:  Present Psychomotor Activity:  EPS, Increased, Shuffling Gait, and TD Concentration: Concentration: Fair and Attention Span: Fair Recall:  Good Fund of Knowledge:Fair Language: Fair Akathisia:   possibly due to EPS Handed:   AIMS (if indicated):    Assets:  Communication Skills Desire for Improvement Financial Resources/Insurance Housing Physical Health Resilience Social Support Transportation Sleep:  Musculoskeletal: Strength & Muscle Tone:  EPS, TD noted Gait & Station: shuffle Patient leans: N/A  Blood pressure (!) 135/99, pulse 81, temperature 97.6 F (36.4 C), temperature source Oral, resp. rate 18, SpO2 97 %.  Recommendations: Based on my evaluation the patient does not appear to have an emergency medical condition.    Inda Merlin, NP 04/01/2021, 3:54 PM

## 2021-06-23 ENCOUNTER — Other Ambulatory Visit: Payer: Self-pay

## 2021-06-23 MED ORDER — BICTEGRAVIR-EMTRICITAB-TENOFOV 50-200-25 MG PO TABS: 1.0000 | ORAL_TABLET | Freq: Every day | ORAL | 0 refills | Status: DC

## 2021-07-20 ENCOUNTER — Ambulatory Visit: Payer: Medicare Other | Admitting: Infectious Diseases

## 2021-07-29 ENCOUNTER — Ambulatory Visit (INDEPENDENT_AMBULATORY_CARE_PROVIDER_SITE_OTHER): Payer: Medicare Other

## 2021-07-29 ENCOUNTER — Encounter: Payer: Self-pay | Admitting: Family

## 2021-07-29 ENCOUNTER — Other Ambulatory Visit: Payer: Self-pay

## 2021-07-29 ENCOUNTER — Ambulatory Visit (INDEPENDENT_AMBULATORY_CARE_PROVIDER_SITE_OTHER): Payer: Medicare Other | Admitting: Family

## 2021-07-29 VITALS — BP 185/101 | HR 79 | Temp 98.4°F | Ht 69.0 in | Wt 139.0 lb

## 2021-07-29 DIAGNOSIS — Z0000 Encounter for general adult medical examination without abnormal findings: Secondary | ICD-10-CM

## 2021-07-29 DIAGNOSIS — Z113 Encounter for screening for infections with a predominantly sexual mode of transmission: Secondary | ICD-10-CM | POA: Diagnosis not present

## 2021-07-29 DIAGNOSIS — F200 Paranoid schizophrenia: Secondary | ICD-10-CM

## 2021-07-29 DIAGNOSIS — B20 Human immunodeficiency virus [HIV] disease: Secondary | ICD-10-CM | POA: Diagnosis not present

## 2021-07-29 DIAGNOSIS — Z23 Encounter for immunization: Secondary | ICD-10-CM | POA: Diagnosis not present

## 2021-07-29 MED ORDER — BICTEGRAVIR-EMTRICITAB-TENOFOV 50-200-25 MG PO TABS: 1.0000 | ORAL_TABLET | Freq: Every day | ORAL | 5 refills | Status: DC

## 2021-07-29 NOTE — Patient Instructions (Signed)
Nice to see you.  We will check your lab work today.  Continue to take your medication daily as prescribed.  Refills have been sent to the pharmacy.  Plan for follow up in 4 months or sooner if needed with Janene Madeira, NP and with lab work on the same day.  Have a great day and stay safe!

## 2021-07-29 NOTE — Assessment & Plan Note (Signed)
[name] Herring has been off medication for approximately 2 months. Recommended to follow up with his Psychiatric provider to ensure he has his medications. Mood appears to be stable and not having any hallucinations. Continue medications per Psychiatry.

## 2021-07-29 NOTE — Progress Notes (Signed)
   Covid-19 Vaccination Clinic  Name:  [name] Herring    MRN: [id number] DOB: Mar 11, 1961  07/29/2021  [name] Herring was observed post Covid-19 immunization for 15 minutes without incident. He was provided with Vaccine Information Sheet and instruction to access the V-Safe system.   [name] Herring was instructed to call 911 with any severe reactions post vaccine: Difficulty breathing  Swelling of face and throat  A fast heartbeat  A bad rash all over body  Dizziness and weakness   Immunizations Administered     Name Date Dose VIS Date Route   Pfizer Covid-19 Vaccine Bivalent Booster 07/29/2021 11:12 AM 0.3 mL 05/25/2021 Intramuscular   Manufacturer: [place]   Lot: [id number]   [place]: Harvey, RN

## 2021-07-29 NOTE — Assessment & Plan Note (Signed)
   Discussed importance of safe sexual practice and condom usage. Condoms offered.   Covid booster and influenza vaccine updated today.

## 2021-07-29 NOTE — Progress Notes (Signed)
Brief Narrative   Patient ID: [name] Herring, male    DOB: [birth year], 60 y.o.   MRN: [id number]  [name] Herring is a 60 y.o. AA male with a history of HIV disease Dx 08/18/1998. Previously followed at [place] [facility].  HIV Risk: MSM.  History of OIs: uncertain.  History of ETOH/cocaine abuse, previously homeless.      Previous Regimens:  Kaletra + Combivir  Genvoya > stopped d/t DDI with [facility] Biktarvy 2019 >> suppressed   Subjective:    Chief Complaint  Patient presents with   Follow-up    Declined condoms     HPI:  [name] Herring is a 60 y.o. male with HIV disease last seen by Janene Madeira, NP on 11/30/2020 with well-controlled virus and good adherence and tolerance to his ART regimen of Biktarvy.  Viral load was found to be 58 with CD4 count of 238.  In the interim he has been hospitalized from 6/14-6/25 with rhabdomyolysis and hyponatremia in the setting of dehydration.  He did leave [place].  Here today for routine follow-up.  [name] Herring continues to take his Biktarvy daily as prescribed with no adverse side effects. Feeling well today. He has not been able to gethis psychiatric medications recently and been feeling fatigued. Denies fevers, chills, night sweats, headaches, changes in vision, neck pain/stiffness, nausea, diarrhea, vomiting, lesions or rashes.  [name] Herring has no problems obtaining medications. Denies feelings of being down, depressed or hopeless. No current recreational or illicit drug use with occasional alcohol and about 1 pack of cigarettes per day. Condoms offered. Healthcare maintenance due includes influenza and Covid booster.   Allergies  Allergen Reactions   Amoxicillin Shortness Of Breath and Rash    Has patient had a PCN reaction causing immediate rash, facial/tongue/throat swelling, [name] Has patient had a PCN reaction causing severe rash involving mucus membranes or skin necrosis: no Has  patient had a PCN reaction that required hospitalization: yes drs office visit Has patient had a PCN reaction occurring within the last 10 years: no If all of the above answers are "NO", then may proceed with Cephalosporin use.    Latex Shortness Of Breath   Abilify [Aripiprazole] Other (See Comments)    Double vision      Outpatient Medications Prior to Visit  Medication Sig Dispense Refill   acetaminophen (TYLENOL) 500 MG tablet Take 1 tablet (500 mg total) by mouth every 6 (six) hours as needed for moderate pain. 30 tablet 0   amLODipine (NORVASC) 5 MG tablet Take 1 tablet (5 mg total) by mouth daily. 30 tablet 0   apixaban (ELIQUIS) 5 MG TABS tablet Take 1 tablet (5 mg total) by mouth 2 (two) times daily. 60 tablet 0   atorvastatin (LIPITOR) 40 MG tablet Take 40 mg by mouth at bedtime.     benztropine (COGENTIN) 1 MG tablet Take 1 tablet (1 mg total) by mouth 2 (two) times daily. For eps 60 tablet 0   fluticasone (FLONASE) 50 MCG/ACT nasal spray Place 1-2 sprays into both nostrils daily as needed for rhinitis.     gabapentin (NEURONTIN) 600 MG tablet Take 1 tablet (600 mg total) by mouth at bedtime. 30 tablet 0   losartan (COZAAR) 25 MG tablet Take 1 tablet (25 mg total) by mouth daily. 30 tablet 0   mirtazapine (REMERON) 15 MG tablet Take 1 tablet (15 mg total) by mouth at bedtime. 30 tablet 0   montelukast (  SINGULAIR) 10 MG tablet Take 10 mg by mouth daily.     pantoprazole (PROTONIX) 40 MG tablet Take 1 tablet (40 mg total) by mouth 2 (two) times daily. 60 tablet 0   QUEtiapine (SEROQUEL) 25 MG tablet Take 1 tablet (25 mg total) by mouth at bedtime. For agitation 30 tablet 0   risperiDONE (RISPERDAL) 2 MG tablet Take 1 tablet (2 mg total) by mouth 2 (two) times daily. For mood control 60 tablet 0   SYMBICORT 160-4.5 MCG/ACT inhaler Inhale 2 puffs into the lungs 2 (two) times daily.     traZODone (DESYREL) 50 MG tablet Take 1 tablet (50 mg total) by mouth at bedtime as needed for  sleep. 30 tablet 0   valbenazine (INGREZZA) 80 MG capsule Take 1 capsule (80 mg total) by mouth at bedtime. For tardive dyskinesia 30 capsule 0   bictegravir-emtricitabine-tenofovir AF (BIKTARVY) 50-200-25 MG TABS tablet Take 1 tablet by mouth daily. PT NEEDS TO CALL OFFICE TO SCHEDULE 30 tablet 0   nicotine (NICODERM CQ - DOSED IN MG/24 HOURS) 14 mg/24hr patch Place 1 patch (14 mg total) onto the skin daily. (Patient not taking: Reported on 07/29/2021) 28 patch 0   No facility-administered medications prior to visit.     Past Medical History:  Diagnosis Date   Depression    HIV (human immunodeficiency virus infection) ([place])    Hypertension    Immune deficiency disorder ([place])    PE (pulmonary thromboembolism) ([place]) 2018   Personality disorder ([place])    Schizophrenia ([place])      Past Surgical History:  Procedure Laterality Date   COLONOSCOPY WITH PROPOFOL N/A 06/15/2017   Procedure: COLONOSCOPY WITH PROPOFOL;  Surgeon: Irene Shipper, MD;  Location: [facility];  Service: Endoscopy;  Laterality: N/A;   ESOPHAGOGASTRODUODENOSCOPY (EGD) WITH PROPOFOL N/A 06/15/2017   Procedure: ESOPHAGOGASTRODUODENOSCOPY (EGD) WITH PROPOFOL;  Surgeon: Irene Shipper, MD;  Location: [facility];  Service: Endoscopy;  Laterality: N/A;   IR GENERIC HISTORICAL  11/04/2016   IR IVC FILTER PLMT / S&I Burke Keels GUID/MOD SED 11/04/2016 Aletta Edouard, MD [facility]-INTER[facility] RAD      Review of Systems  Constitutional:  Negative for appetite change, chills, fatigue, fever and unexpected weight change.  Eyes:  Negative for visual disturbance.  Respiratory:  Negative for cough, chest tightness, shortness of breath and wheezing.   Cardiovascular:  Negative for chest pain and leg swelling.  Gastrointestinal:  Negative for abdominal pain, constipation, diarrhea, nausea and vomiting.  Genitourinary:  Negative for dysuria, flank pain, frequency, genital sores, hematuria and urgency.  Skin:  Negative for rash.  Allergic/Immunologic:  Negative for immunocompromised state.  Neurological:  Negative for dizziness and headaches.     Objective:    BP (!) 185/101   Pulse 79   Temp 98.4 F (36.9 C) (Oral)   Ht 5\' 9"  (1.753 m)   Wt 139 lb (63 kg)   SpO2 93%   BMI 20.53 kg/m  Nursing note and vital signs reviewed.  Physical Exam Constitutional:      General: He is not in acute distress.    Appearance: He is well-developed.     Comments: Seated in the chair; very restless/fidgety.   Eyes:     Conjunctiva/sclera: Conjunctivae normal.  Cardiovascular:     Rate and Rhythm: Normal rate and regular rhythm.     Heart sounds: Normal heart sounds. No murmur heard.   No friction rub. No gallop.  Pulmonary:     Effort: Pulmonary effort is normal.  No respiratory distress.     Breath sounds: Normal breath sounds. No wheezing or rales.  Chest:     Chest wall: No tenderness.  Abdominal:     General: Bowel sounds are normal.     Palpations: Abdomen is soft.     Tenderness: There is no abdominal tenderness.  Musculoskeletal:     Cervical back: Neck supple.  Lymphadenopathy:     Cervical: No cervical adenopathy.  Skin:    General: Skin is warm and dry.     Findings: No rash.  Neurological:     Mental Status: He is alert and oriented to person, place, and time.  Psychiatric:        Behavior: Behavior normal.        Thought Content: Thought content normal.        Judgment: Judgment normal.     Depression screen [facility] 2/9 07/29/2021 11/30/2020 05/21/2020 07/08/2019 06/10/2019  Decreased Interest 0 0 0 0 0  Down, Depressed, Hopeless 0 0 1 0 0  PHQ - 2 Score 0 0 1 0 0  Altered sleeping - - - - -  Tired, decreased energy - - - - -  Change in appetite - - - - -  Feeling bad or failure about yourself  - - - - -  Trouble concentrating - - - - -  Moving slowly or fidgety/restless - - - - -  Suicidal thoughts - - - - -  PHQ-9 Score - - - - -  Some recent data might be hidden       Assessment & Plan:    Patient Active  Problem List   Diagnosis Date Noted   Healthcare maintenance 07/29/2021   Failure to thrive in adult    Protein-calorie malnutrition, severe 03/10/2021   Hypernatremia 03/08/2021   Dehydration 03/08/2021   Rhabdomyolysis 03/08/2021   Schizophrenia ([place]) 02/18/2021   AKI (acute kidney injury) ([place]) 01/15/2020   History of pulmonary embolism 01/15/2020   Genital warts 10/16/2019   GERD with esophagitis 01/22/2019   Protein C deficiency ([place]) 07/05/2017   Benign neoplasm of transverse colon    Emphysema lung ([place]) 11/20/2016   DVT of lower extremity, bilateral ([place]) 11/04/2016   Tobacco abuse 11/02/2016   HTN (hypertension) 08/25/2015   Alcohol use disorder, severe, in early remission ([place]) 12/27/2014   Alcohol abuse 09/26/2014   Human immunodeficiency virus (HIV) disease ([place]) 11/09/2006   CA IN SITU, RECTUM 11/09/2006   Hypothyroidism 11/09/2006   Depression 11/09/2006     Problem List Items Addressed This Visit       Other   Human immunodeficiency virus (HIV) disease ([place]) - Primary (Chronic)    [name] Herring continues to have well controlled virus with good adherence and tolerance to his ART regimen of Biktarvy. No signs/symptoms of opportunistic infection. We reviewed previous lab work and discussed plan of care. Check lab work today. No problems getting medications from the pharmacy. Continue current dose of Biktarvy. Follow up in 4 months or sooner with Janene Madeira, NP.       Relevant Medications   bictegravir-emtricitabine-tenofovir AF (BIKTARVY) 50-200-25 MG TABS tablet   Other Relevant Orders   COMPLETE METABOLIC PANEL WITH GFR   HIV-1 RNA quant-no reflex-bld   T-helper cells (CD4) count   Schizophrenia ([place])    [name] Herring has been off medication for approximately 2 months. Recommended to follow up with his Psychiatric provider to ensure he has his medications. Mood appears to be stable and not  having any hallucinations. Continue medications per Psychiatry.        Healthcare maintenance    Discussed importance of safe sexual practice and condom usage. Condoms offered.  Covid booster and influenza vaccine updated today.      Other Visit Diagnoses     Screening for STDs (sexually transmitted diseases)       Relevant Orders   RPR   Need for immunization against influenza       Relevant Orders   Flu Vaccine QUAD 77mo+IM (Fluarix, Fluzone & Alfiuria Quad PF) (Completed)        I have changed Leodis Binet bictegravir-emtricitabine-tenofovir AF. I am also having him maintain his acetaminophen, Symbicort, fluticasone, montelukast, apixaban, atorvastatin, benztropine, QUEtiapine, risperiDONE, traZODone, valbenazine, mirtazapine, gabapentin, pantoprazole, nicotine, losartan, and amLODipine.   Meds ordered this encounter  Medications   bictegravir-emtricitabine-tenofovir AF (BIKTARVY) 50-200-25 MG TABS tablet    Sig: Take 1 tablet by mouth daily.    Dispense:  30 tablet    Refill:  5    Order Specific Question:   Supervising Provider    Answer:   Carlyle Basques [[id number]]     Follow-up: Return in about 4 months (around 11/26/2021), or if symptoms worsen or fail to improve.   Terri Piedra, MSN, FNP-C Nurse Practitioner [facility] [place] number: [phone]

## 2021-07-29 NOTE — Assessment & Plan Note (Signed)
[name] Herring continues to have well controlled virus with good adherence and tolerance to his ART regimen of Biktarvy. No signs/symptoms of opportunistic infection. We reviewed previous lab work and discussed plan of care. Check lab work today. No problems getting medications from the pharmacy. Continue current dose of Biktarvy. Follow up in 4 months or sooner with Janene Madeira, NP.

## 2021-08-01 ENCOUNTER — Telehealth: Payer: Self-pay

## 2021-08-01 LAB — COMPLETE METABOLIC PANEL WITH GFR
AG Ratio: 1.5 (calc) (ref 1.0–2.5)
ALT: 25 U/L (ref 9–46)
AST: 29 U/L (ref 10–35)
Albumin: 4.1 g/dL (ref 3.6–5.1)
Alkaline phosphatase (APISO): 58 U/L (ref 35–144)
BUN: 16 mg/dL (ref 7–25)
CO2: 28 mmol/L (ref 20–32)
Calcium: 9.8 mg/dL (ref 8.6–10.3)
Chloride: 113 mmol/L — ABNORMAL HIGH (ref 98–110)
Creat: 1.11 mg/dL (ref 0.70–1.35)
Globulin: 2.7 g/dL (calc) (ref 1.9–3.7)
Glucose, Bld: 89 mg/dL (ref 65–99)
Potassium: 4 mmol/L (ref 3.5–5.3)
Sodium: 145 mmol/L (ref 135–146)
Total Bilirubin: 0.3 mg/dL (ref 0.2–1.2)
Total Protein: 6.8 g/dL (ref 6.1–8.1)
eGFR: 76 mL/min/{1.73_m2} (ref >=60)

## 2021-08-01 LAB — HIV-1 RNA QUANT-NO REFLEX-BLD
HIV 1 RNA Quant: 64 Copies/mL — ABNORMAL HIGH
HIV-1 RNA Quant, Log: 1.81 Log cps/mL — ABNORMAL HIGH

## 2021-08-01 LAB — T-HELPER CELLS (CD4) COUNT (NOT AT ARMC)
Absolute CD4: 378 cells/uL — ABNORMAL LOW (ref 490–1740)
CD4 T Helper %: 25 % — ABNORMAL LOW (ref 30–61)
Total lymphocyte count: 1492 cells/uL (ref 850–3900)

## 2021-08-01 LAB — RPR: RPR Ser Ql: NONREACTIVE

## 2021-08-01 NOTE — Telephone Encounter (Signed)
Spoke with patient to relay results per FNP. Patient does not have any questions at this time.  Leatrice Jewels, RMA

## 2021-08-01 NOTE — Telephone Encounter (Signed)
-----   Message from [place], [place] sent at 08/01/2021  4:22 PM EST ----- Please inform Mr. Milks his viral load is undetectable and CD4 count is 378. Kidney function, liver function and electrolytes look good. RPR was negative for syphilis.

## 2021-11-30 ENCOUNTER — Ambulatory Visit: Payer: Medicare Other | Admitting: Infectious Diseases

## 2021-12-07 ENCOUNTER — Encounter: Payer: Self-pay | Admitting: Infectious Diseases

## 2021-12-07 ENCOUNTER — Other Ambulatory Visit: Payer: Self-pay

## 2021-12-07 ENCOUNTER — Ambulatory Visit (INDEPENDENT_AMBULATORY_CARE_PROVIDER_SITE_OTHER): Payer: Medicare Other | Admitting: Infectious Diseases

## 2021-12-07 DIAGNOSIS — F1010 Alcohol abuse, uncomplicated: Secondary | ICD-10-CM | POA: Diagnosis not present

## 2021-12-07 DIAGNOSIS — B20 Human immunodeficiency virus [HIV] disease: Secondary | ICD-10-CM | POA: Diagnosis not present

## 2021-12-07 DIAGNOSIS — I10 Essential (primary) hypertension: Secondary | ICD-10-CM | POA: Diagnosis not present

## 2021-12-07 DIAGNOSIS — Z720 Tobacco use: Secondary | ICD-10-CM

## 2021-12-07 MED ORDER — BICTEGRAVIR-EMTRICITAB-TENOFOV 50-200-25 MG PO TABS: 1.0000 | ORAL_TABLET | Freq: Every day | ORAL | 11 refills | Status: DC

## 2021-12-07 NOTE — Assessment & Plan Note (Signed)
Doing well on Biktarvy once daily. Medication reviewed closely today and no significant interactions with ARV. Not sexually active. Fill history aligns with reported adherence.  ?Will defer labs today as he has been doing great for a long time.  ?Will have him back in 4 months to repeat labs then.  ?Refills provided.  ?

## 2021-12-07 NOTE — Progress Notes (Signed)
? ?Patient Name: [name] Herring  ?Date of Birth: [birth year]  ?MRN: [id number]  ?PCP: Sonia Side., FNP  ? ? ? ?SUBJECTIVE:  ?Brief Narrative:  ?[name] Herring is a 61 y.o. AA male with a history of HIV disease Dx 08/18/1998. Previously followed at [place] [facility].  ?HIV Risk: MSM.  ?History of OIs: uncertain.  ?History of ETOH/cocaine abuse, previously homeless.  ? ? ?Previous Regimens:  ?Kaletra + Combivir  ?Genvoya > stopped d/t DDI with [facility] ?Biktarvy 2019 >> suppressed  ? ? ?CC: ?[name] Herring is doing well today. LOV in Nov 2022 with Marya Amsler and doing well on Biktarvy once daily at that time.  ? ?Has a "happy bloom" hit him. Staying in a duplex on [address] - clean and comfortable there. Visits and speaks with his kids daily. He says he has been eating 3 meals a day with snacks. No trouble sleeping at all and taking all of his medications regularly.  ? ?Saw Josph Macho last month - thinks he looks good. Needs a colonoscopy 5-10 years per 2018 report. Seems that they see him pretty frequently.  ? ?He has no worries or concerns today.  ? ? ?Review of Systems  ?Constitutional:  Negative for chills and fever.  ?HENT:  Negative for tinnitus.   ?Eyes:  Negative for blurred vision and photophobia.  ?Respiratory:  Negative for cough and sputum production.   ?Cardiovascular:  Negative for chest pain.  ?Gastrointestinal:  Negative for diarrhea, nausea and vomiting.  ?Genitourinary:  Negative for dysuria.  ?Skin:  Negative for rash.  ?Neurological:  Negative for headaches.  ? ? ?Past Medical History:  ?Diagnosis Date  ? Depression   ? HIV (human immunodeficiency virus infection) ([place])   ? Hypertension   ? Immune deficiency disorder ([place])   ? PE (pulmonary thromboembolism) ([place]) 2018  ? Personality disorder ([place])   ? Schizophrenia ([place])   ? ? ? ?Outpatient Medications Prior to Visit  ?Medication Sig Dispense Refill  ? acetaminophen (TYLENOL) 500 MG tablet Take 1 tablet (500 mg total) by mouth every 6 (six) hours as needed for  moderate pain. 30 tablet 0  ? albuterol (VENTOLIN HFA) 108 (90 Base) MCG/ACT inhaler Inhale into the lungs.    ? amLODipine (NORVASC) 5 MG tablet Take 1 tablet (5 mg total) by mouth daily. 30 tablet 0  ? apixaban (ELIQUIS) 5 MG TABS tablet Take 1 tablet (5 mg total) by mouth 2 (two) times daily. 60 tablet 0  ? atorvastatin (LIPITOR) 40 MG tablet Take 40 mg by mouth at bedtime.    ? benztropine (COGENTIN) 1 MG tablet Take 1 tablet (1 mg total) by mouth 2 (two) times daily. For eps 60 tablet 0  ? Cholecalciferol 25 MCG (1000 UT) capsule Take by mouth.    ? fluticasone (FLONASE) 50 MCG/ACT nasal spray Place 1-2 sprays into both nostrils daily as needed for rhinitis.    ? gabapentin (NEURONTIN) 800 MG tablet Take 800 mg by mouth at bedtime.    ? lisinopril (ZESTRIL) 40 MG tablet Take 40 mg by mouth every morning.    ? mirtazapine (REMERON) 15 MG tablet Take 1 tablet (15 mg total) by mouth at bedtime. 30 tablet 0  ? pantoprazole (PROTONIX) 40 MG tablet Take 1 tablet (40 mg total) by mouth 2 (two) times daily. 60 tablet 0  ? QUEtiapine (SEROQUEL) 25 MG tablet Take 1 tablet (25 mg total) by mouth at bedtime. For agitation 30 tablet 0  ? risperiDONE (RISPERDAL)  2 MG tablet Take 1 tablet (2 mg total) by mouth 2 (two) times daily. For mood control 60 tablet 0  ? SYMBICORT 160-4.5 MCG/ACT inhaler Inhale 2 puffs into the lungs 2 (two) times daily.    ? traZODone (DESYREL) 50 MG tablet Take 1 tablet (50 mg total) by mouth at bedtime as needed for sleep. 30 tablet 0  ? valbenazine (INGREZZA) 80 MG capsule Take 1 capsule (80 mg total) by mouth at bedtime. For tardive dyskinesia 30 capsule 0  ? bictegravir-emtricitabine-tenofovir AF (BIKTARVY) 50-200-25 MG TABS tablet Take 1 tablet by mouth daily. 30 tablet 5  ? losartan (COZAAR) 25 MG tablet Take 1 tablet (25 mg total) by mouth daily. 30 tablet 0  ? gabapentin (NEURONTIN) 600 MG tablet Take 1 tablet (600 mg total) by mouth at bedtime. 30 tablet 0  ? mirtazapine (REMERON) 15 MG  tablet Take 15 mg by mouth at bedtime.    ? montelukast (SINGULAIR) 10 MG tablet Take 10 mg by mouth daily. (Patient not taking: Reported on 12/07/2021)    ? nicotine (NICODERM CQ - DOSED IN MG/24 HOURS) 14 mg/24hr patch Place 1 patch (14 mg total) onto the skin daily. (Patient not taking: Reported on 07/29/2021) 28 patch 0  ? ?No facility-administered medications prior to visit.  ? ?Allergies  ?Allergen Reactions  ? Amoxicillin Shortness Of Breath and Rash  ?  Has patient had a PCN reaction causing immediate rash, facial/tongue/throat swelling, [name] ?Has patient had a PCN reaction causing severe rash involving mucus membranes or skin necrosis: no ?Has patient had a PCN reaction that required hospitalization: yes drs office visit ?Has patient had a PCN reaction occurring within the last 10 years: no ?If all of the above answers are "NO", then may proceed with Cephalosporin use. ?  ? Latex Shortness Of Breath  ? Abilify [Aripiprazole] Other (See Comments)  ?  Double vision  ? ?Social History  ? ?Tobacco Use  ? Smoking status: Every Day  ?  Packs/day: 1.00  ?  Years: 33.00  ?  Pack years: 33.00  ?  Types: Cigarettes  ?  Last attempt to quit: 04/25/2019  ?  Years since quitting: 2.6  ? Smokeless tobacco: Never  ? Tobacco comments:  ?  States cutting back  ?Vaping Use  ? Vaping Use: Never used  ?Substance Use Topics  ? Alcohol use: Not Currently  ?  Comment: states once a month  ? Drug use: Not Currently  ?  Comment: Patient denies use, but UDS + cocaine  ? ?Objective: ? ?Vitals:  ? 12/07/21 1340  ?BP: (!) 157/91  ?Pulse: 90  ?Temp: 98.4 ?F (36.9 ?C)  ?TempSrc: Oral  ?SpO2: 96%  ?Weight: 133 lb (60.3 kg)  ?Height: '5\' 9"'$  (1.753 m)  ? ?Body mass index is 19.64 kg/m?. ? ?Physical Exam ?Constitutional:   ?   Appearance: He is well-developed.  ?   Comments: Thin appearing man.  Seated comfortably in the visit.  Pleasant.   ?HENT:  ?   Mouth/Throat:  ?   Dentition: Normal dentition. No  dental abscesses.  ?Cardiovascular:  ?   Rate and Rhythm: Normal rate and regular rhythm.  ?   Heart sounds: Normal heart sounds.  ?Pulmonary:  ?   Effort: Pulmonary effort is normal.  ?   Breath sounds: Normal breath sounds.  ?Abdominal:  ?   General: There is no distension.  ?   Palpations: Abdomen is soft.  ?  Tenderness: There is no abdominal tenderness.  ?Lymphadenopathy:  ?   Cervical: No cervical adenopathy.  ?Skin: ?   General: Skin is warm and dry.  ?   Findings: No rash.  ?Neurological:  ?   Mental Status: He is alert and oriented to person, place, and time.  ?Psychiatric:     ?   Judgment: Judgment normal.  ?   Comments: In good spirits today. Appears mildly anxious.  ? ? ?Lab Results ?Lab Results  ?Component Value Date  ? WBC 4.8 03/15/2021  ? HGB 12.9 (L) 03/15/2021  ? HCT 39.1 03/15/2021  ? MCV 101.6 (H) 03/15/2021  ? PLT 118 (L) 03/15/2021  ?  ?Lab Results  ?Component Value Date  ? CREATININE 1.11 07/29/2021  ? BUN 16 07/29/2021  ? NA 145 07/29/2021  ? K 4.0 07/29/2021  ? CL 113 (H) 07/29/2021  ? CO2 28 07/29/2021  ?  ?Lab Results  ?Component Value Date  ? ALT 25 07/29/2021  ? AST 29 07/29/2021  ? ALKPHOS 46 03/12/2021  ? BILITOT 0.3 07/29/2021  ?  ?Lab Results  ?Component Value Date  ? CHOL 115 02/20/2021  ? HDL 59 02/20/2021  ? [place] 42 02/20/2021  ? TRIG 69 02/20/2021  ? CHOLHDL 1.9 02/20/2021  ? ?HIV 1 RNA Quant (Copies/mL)  ?Date Value  ?07/29/2021 64 (H)  ?11/30/2020 58 (H)  ?05/21/2020 34 (H)  ? ?CD4 T Cell Abs (/uL)  ?Date Value  ?11/30/2020 238 (L)  ?05/21/2020 256 (L)  ?01/22/2019 199 (L)  ? ?Lab Results  ?Component Value Date  ? HAV REACTIVE (A) 06/17/2018  ? ?Lab Results  ?Component Value Date  ? HEPBSAG NON-REACTIVE 06/17/2018  ? HEPBSAB BORDERLINE (A) 06/17/2018  ? ? ?ASSESSMENT & PLAN: ?  ?Problem List Items Addressed This Visit   ? ?  ? Unprioritized  ? Human immunodeficiency virus (HIV) disease ([place]) (Chronic)  ?  Doing well on Biktarvy once daily. Medication reviewed closely today  and no significant interactions with ARV. Not sexually active. Fill history aligns with reported adherence.  ?Will defer labs today as he has been doing great for a long time.  ?Will have him back in 4 months t

## 2021-12-07 NOTE — Assessment & Plan Note (Signed)
BP Readings from Last 3 Encounters:  ?12/07/21 (!) 157/91  ?07/29/21 (!) 185/101  ?03/18/21 (!) 132/98  ? ?We talked about this today - would like to see his BP < 140/90. He follows with his PCP regularly. ?

## 2021-12-07 NOTE — Assessment & Plan Note (Signed)
Currently doing well with abstaining. Feels that keeping up with his kids helps with this.  ?

## 2021-12-07 NOTE — Patient Instructions (Addendum)
Please continue your Biktarvy.  ? ?No blood work today! [name] Herring!  ? ?Will see you back in July to check in and get blood work then.  ? ?I will tell [name] Herring you said hello! ? ?Constipation, Adult ?Constipation is when a person has fewer than three bowel movements in a week, has difficulty having a bowel movement, or has stools (feces) that are dry, hard, or larger than normal. Constipation may be caused by an underlying condition. It may become worse with age if a person takes certain medicines and does not take in enough fluids. ?Follow these instructions at home: ?Eating and drinking ? ?Eat foods that have a lot of fiber, such as beans, whole grains, and fresh fruits and vegetables. ?Limit foods that are low in fiber and high in fat and processed sugars, such as fried or sweet foods. These include french fries, hamburgers, cookies, candies, and soda. ?Drink enough fluid to keep your urine pale yellow. ?General instructions ?Exercise regularly or as told by your health care provider. Try to do 150 minutes of moderate exercise each week. ?Use the bathroom when you have the urge to go. Do not hold it in. ?Take over-the-counter and prescription medicines only as told by your health care provider. This includes any fiber supplements. ?During bowel movements: ?Practice deep breathing while relaxing the lower abdomen. ?Practice pelvic floor relaxation. ?Watch your condition for any changes. Let your health care provider know about them. ?Keep all follow-up visits as told by your health care provider. This is important. ?Contact a health care provider if: ?You have pain that gets worse. ?You have a fever. ?You do not have a bowel movement after 4 days. ?You vomit. ?You are not hungry or you lose weight. ?You are bleeding from the opening between the buttocks (anus). ?You have thin, pencil-like stools. ?Get help right away if: ?You have a fever and your symptoms suddenly get worse. ?You leak stool or have blood in your  stool. ?Your abdomen is bloated. ?You have severe pain in your abdomen. ?You feel dizzy or you faint. ?Summary ?Constipation is when a person has fewer than three bowel movements in a week, has difficulty having a bowel movement, or has stools (feces) that are dry, hard, or larger than normal. ?Eat foods that have a lot of fiber, such as beans, whole grains, and fresh fruits and vegetables. ?Drink enough fluid to keep your urine pale yellow. ?Take over-the-counter and prescription medicines only as told by your health care provider. This includes any fiber supplements. ?This information is not intended to replace advice given to you by your health care provider. Make sure you discuss any questions you have with your health care provider. ?Document Revised: 07/30/2019 Document Reviewed: 07/30/2019 ?Elsevier Patient Education ? [place]. ? ?

## 2021-12-07 NOTE — Assessment & Plan Note (Signed)
Counseled to quit to help with blood pressure - pre-contemplative.  ?

## 2022-02-14 ENCOUNTER — Telehealth: Payer: Self-pay

## 2022-02-14 NOTE — Telephone Encounter (Signed)
Notes scanned to referral 

## 2022-03-01 ENCOUNTER — Ambulatory Visit (INDEPENDENT_AMBULATORY_CARE_PROVIDER_SITE_OTHER): Payer: Medicare Other | Admitting: Podiatry

## 2022-03-01 DIAGNOSIS — Z91199 Patient's noncompliance with other medical treatment and regimen due to unspecified reason: Secondary | ICD-10-CM

## 2022-03-01 NOTE — Progress Notes (Signed)
No show

## 2022-03-06 ENCOUNTER — Encounter: Payer: Self-pay | Admitting: Infectious Diseases

## 2022-03-21 ENCOUNTER — Ambulatory Visit: Payer: Medicare Other | Admitting: Cardiovascular Disease

## 2022-03-29 ENCOUNTER — Ambulatory Visit: Payer: Medicare Other | Admitting: Podiatry

## 2022-12-11 ENCOUNTER — Other Ambulatory Visit: Payer: Self-pay

## 2022-12-11 MED ORDER — BICTEGRAVIR-EMTRICITAB-TENOFOV 50-200-25 MG PO TABS: 1.0000 | ORAL_TABLET | Freq: Every day | ORAL | 2 refills | Status: DC

## 2023-03-05 ENCOUNTER — Telehealth: Payer: Self-pay

## 2023-03-05 NOTE — Telephone Encounter (Signed)
Left voicemail with patient requesting call back to schedule overdue appointment with office. Refills pending appt. Juanita Laster, RMA

## 2023-03-07 ENCOUNTER — Other Ambulatory Visit: Payer: Self-pay

## 2023-03-07 MED ORDER — BICTEGRAVIR-EMTRICITAB-TENOFOV 50-200-25 MG PO TABS: 1.0000 | ORAL_TABLET | Freq: Every day | ORAL | 0 refills | Status: DC

## 2023-03-28 ENCOUNTER — Ambulatory Visit: Payer: 59 | Admitting: Infectious Diseases

## 2023-03-28 ENCOUNTER — Telehealth: Payer: Self-pay

## 2023-03-28 NOTE — Telephone Encounter (Signed)
/  Attempted to reach to reschedule NO SHOW with Rexene Alberts 03/28/23 at 3:45 pm. Left HIPAA compliant vm.

## 2023-04-19 ENCOUNTER — Encounter (HOSPITAL_COMMUNITY): Payer: Self-pay

## 2023-04-19 ENCOUNTER — Other Ambulatory Visit: Payer: Self-pay

## 2023-04-19 ENCOUNTER — Emergency Department (HOSPITAL_COMMUNITY)
Admission: EM | Admit: 2023-04-19 | Discharge: 2023-04-20 | Disposition: A | Discharge status: Home / Self Care | Payer: 59 | Attending: Emergency Medicine | Admitting: Emergency Medicine

## 2023-04-19 DIAGNOSIS — R059 Cough, unspecified: Secondary | ICD-10-CM | POA: Insufficient documentation

## 2023-04-19 DIAGNOSIS — Z720 Tobacco use: Secondary | ICD-10-CM | POA: Insufficient documentation

## 2023-04-19 DIAGNOSIS — I10 Essential (primary) hypertension: Secondary | ICD-10-CM | POA: Insufficient documentation

## 2023-04-19 DIAGNOSIS — Z91040 Latex allergy status: Secondary | ICD-10-CM | POA: Insufficient documentation

## 2023-04-19 DIAGNOSIS — Z79899 Other long term (current) drug therapy: Secondary | ICD-10-CM | POA: Insufficient documentation

## 2023-04-19 DIAGNOSIS — R0789 Other chest pain: Secondary | ICD-10-CM | POA: Insufficient documentation

## 2023-04-19 DIAGNOSIS — Z21 Asymptomatic human immunodeficiency virus [HIV] infection status: Secondary | ICD-10-CM | POA: Insufficient documentation

## 2023-04-19 NOTE — ED Triage Notes (Signed)
Pt  BIB [facility] from Home d/t pain neck, hand shoulders and chest.  Pt states he has Schizophrenia and does not want to be seen for that.  He is hearing "classical music right now in his head" and " I like that".

## 2023-04-20 ENCOUNTER — Emergency Department (HOSPITAL_COMMUNITY): Payer: 59

## 2023-04-20 LAB — CBC WITH DIFFERENTIAL/PLATELET
Abs Immature Granulocytes: 0.01 10*3/uL (ref 0.00–0.07)
Basophils Absolute: 0 10*3/uL (ref 0.0–0.1)
Basophils Relative: 0 %
Eosinophils Absolute: 0 10*3/uL (ref 0.0–0.5)
Eosinophils Relative: 1 %
HCT: 39 % (ref 39.0–52.0)
Hemoglobin: 13 g/dL (ref 13.0–17.0)
Immature Granulocytes: 0 %
Lymphocytes Relative: 23 %
Lymphs Abs: 1 10*3/uL (ref 0.7–4.0)
MCH: 35.6 pg — ABNORMAL HIGH (ref 26.0–34.0)
MCHC: 33.3 g/dL (ref 30.0–36.0)
MCV: 106.8 fL — ABNORMAL HIGH (ref 80.0–100.0)
Monocytes Absolute: 0.4 10*3/uL (ref 0.1–1.0)
Monocytes Relative: 9 %
Neutro Abs: 3.1 10*3/uL (ref 1.7–7.7)
Neutrophils Relative %: 67 %
Platelets: 151 10*3/uL (ref 150–400)
RBC: 3.65 MIL/uL — ABNORMAL LOW (ref 4.22–5.81)
RDW: 14.7 % (ref 11.5–15.5)
WBC: 4.6 10*3/uL (ref 4.0–10.5)
nRBC: 0 % (ref 0.0–0.2)

## 2023-04-20 LAB — COMPREHENSIVE METABOLIC PANEL
ALT: 45 U/L — ABNORMAL HIGH (ref 0–44)
AST: 52 U/L — ABNORMAL HIGH (ref 15–41)
Albumin: 3.4 g/dL — ABNORMAL LOW (ref 3.5–5.0)
Alkaline Phosphatase: 50 U/L (ref 38–126)
Anion gap: 12 (ref 5–15)
BUN: 18 mg/dL (ref 8–23)
CO2: 24 mmol/L (ref 22–32)
Calcium: 9 mg/dL (ref 8.9–10.3)
Chloride: 103 mmol/L (ref 98–111)
Creatinine, Ser: 0.96 mg/dL (ref 0.61–1.24)
GFR, Estimated: >60 mL/min (ref >60)
Glucose, Bld: 81 mg/dL (ref 70–99)
Potassium: 4.2 mmol/L (ref 3.5–5.1)
Sodium: 139 mmol/L (ref 135–145)
Total Bilirubin: 0.7 mg/dL (ref 0.3–1.2)
Total Protein: 6.5 g/dL (ref 6.5–8.1)

## 2023-04-20 LAB — TROPONIN I (HIGH SENSITIVITY)
Troponin I (High Sensitivity): 6 ng/L (ref <18)
Troponin I (High Sensitivity): 5 ng/L (ref <18)

## 2023-04-20 MED ORDER — IOHEXOL 350 MG/ML SOLN
75.0000 mL | Freq: Once | INTRAVENOUS | Status: AC | PRN
  Administered 2023-04-20: 75 mL via INTRAVENOUS

## 2023-04-20 NOTE — ED Provider Notes (Signed)
[place] EMERGENCY DEPARTMENT AT [facility] Provider Note   CSN: [id number] Arrival date & time: 04/19/23  2341     History  Chief Complaint  Patient presents with   Pain    [name] Herring is a 62 y.o. male.  The history is provided by the patient and medical records.  [name] Herring is a 62 y.o. male who presents to the Emergency Department complaining of chest pain.  He reports 2 weeks of constant global chest pain.  He does have associated cough.  At times the pain radiates to his right arm.  No significant shortness of breath, fevers, abdominal pain, nausea, vomiting, leg swelling or pain.  He has a history of hypertension, HIV well-controlled on medications.  He does use tobacco, alcohol.  He denies any drug use.     Home Medications Prior to Admission medications   Medication Sig Start Date End Date Taking? Authorizing Provider  acetaminophen (TYLENOL) 500 MG tablet Take 1 tablet (500 mg total) by mouth every 6 (six) hours as needed for moderate pain. 11/07/16   Rodolph Bong, MD  amLODipine (NORVASC) 5 MG tablet Take 1 tablet (5 mg total) by mouth daily. 03/17/21 12/07/25  Arrien, York Ram, MD  apixaban (ELIQUIS) 5 MG TABS tablet Take 1 tablet (5 mg total) by mouth 2 (two) times daily. 06/15/20   Blanchard Kelch, NP  atorvastatin (LIPITOR) 40 MG tablet Take 40 mg by mouth at bedtime. 02/05/21   [provider]  benztropine (COGENTIN) 1 MG tablet Take 1 tablet (1 mg total) by mouth 2 (two) times daily. For eps 02/24/21   Armandina Stammer I, NP  bictegravir-emtricitabine-tenofovir AF (BIKTARVY) 50-200-25 MG TABS tablet Take 1 tablet by mouth daily. 03/07/23   Blanchard Kelch, NP  Cholecalciferol 25 MCG (1000 UT) capsule Take by mouth.    [provider]  fluticasone (FLONASE) 50 MCG/ACT nasal spray Place 1-2 sprays into both nostrils daily as needed for rhinitis. 11/01/19   [provider]  gabapentin (NEURONTIN) 800 MG tablet Take  800 mg by mouth at bedtime. 11/14/21   [provider]  lisinopril (ZESTRIL) 40 MG tablet Take 40 mg by mouth every morning. 11/14/21   [provider]  losartan (COZAAR) 25 MG tablet Take 1 tablet (25 mg total) by mouth daily. 03/17/21 07/29/21  Arrien, York Ram, MD  mirtazapine (REMERON) 15 MG tablet Take 1 tablet (15 mg total) by mouth at bedtime. 03/17/21 12/08/22  Arrien, York Ram, MD  montelukast (SINGULAIR) 10 MG tablet Take 10 mg by mouth daily. 02/23/22   [provider]  pantoprazole (PROTONIX) 40 MG tablet Take 1 tablet (40 mg total) by mouth 2 (two) times daily. 03/17/21 06/09/22  Arrien, York Ram, MD  QUEtiapine (SEROQUEL) 25 MG tablet Take 1 tablet (25 mg total) by mouth at bedtime. For agitation 02/24/21   Armandina Stammer I, NP  risperiDONE (RISPERDAL) 2 MG tablet Take 1 tablet (2 mg total) by mouth 2 (two) times daily. For mood control 02/24/21   Armandina Stammer I, NP  SYMBICORT 160-4.5 MCG/ACT inhaler Inhale 2 puffs into the lungs 2 (two) times daily. 10/03/19   [provider]  traZODone (DESYREL) 50 MG tablet Take 1 tablet (50 mg total) by mouth at bedtime as needed for sleep. 02/24/21   Armandina Stammer I, NP  valbenazine (INGREZZA) 80 MG capsule Take 1 capsule (80 mg total) by mouth at bedtime. For tardive dyskinesia 02/24/21   Sanjuana Kava,  NP  cetirizine (ZYRTEC) 10 MG tablet Take 1 tablet (10 mg total) by mouth daily. Patient not taking: Reported on 10/22/2019 02/26/18 11/15/19  Hoy Register, MD  haloperidol (HALDOL) 10 MG tablet Take 0.5 tablets (5 mg total) by mouth 2 (two) times daily. Patient not taking: Reported on 10/22/2019 06/18/17 11/15/19  Zannie Cove, MD      Allergies    Amoxicillin, Latex, and Abilify [aripiprazole]    Review of Systems   Review of Systems  All other systems reviewed and are negative.   Physical Exam Updated Vital Signs BP (!) 150/110 (BP Location: Right Arm)   Pulse 80   Temp 98.2 F (36.8 C) (Oral)    Resp 17   Ht 5\' 9"  (1.753 m)   Wt 59 kg   SpO2 97%   BMI 19.20 kg/m  Physical Exam Vitals and nursing note reviewed.  Constitutional:      Appearance: He is well-developed.  HENT:     Head: Normocephalic and atraumatic.  Cardiovascular:     Rate and Rhythm: Normal rate and regular rhythm.     Heart sounds: No murmur heard. Pulmonary:     Effort: Pulmonary effort is normal. No respiratory distress.     Breath sounds: Normal breath sounds.  Abdominal:     Palpations: Abdomen is soft.     Tenderness: There is no abdominal tenderness. There is no guarding or rebound.  Musculoskeletal:        General: No swelling or tenderness.  Skin:    General: Skin is warm and dry.  Neurological:     Mental Status: He is alert and oriented to person, place, and time.  Psychiatric:        Behavior: Behavior normal.     ED Results / Procedures / Treatments   Labs (all labs ordered are listed, but only abnormal results are displayed) Labs Reviewed  COMPREHENSIVE METABOLIC PANEL - Abnormal; Notable for the following components:      Result Value   Albumin 3.4 (*)    AST 52 (*)    ALT 45 (*)    All other components within normal limits  CBC WITH DIFFERENTIAL/PLATELET - Abnormal; Notable for the following components:   RBC 3.65 (*)    MCV 106.8 (*)    MCH 35.6 (*)    All other components within normal limits  TROPONIN I (HIGH SENSITIVITY)  TROPONIN I (HIGH SENSITIVITY)    EKG EKG Interpretation Date/Time:  Thursday April 19 2023 23:48:39 EDT Ventricular Rate:  76 PR Interval:  140 QRS Duration:  88 QT Interval:  402 QTC Calculation: 452 R Axis:   2  Text Interpretation: Normal sinus rhythm Minimal voltage criteria for LVH, may be normal variant ( Cornell product ) Borderline ECG Confirmed by Tilden Fossa [phone]) on 04/20/2023 2:43:44 AM  Radiology CT Angio Chest PE W/Cm &/Or Wo Cm  Result Date: 04/20/2023 CLINICAL DATA:  Pulmonary embolism suspected.  High probability. EXAM:  CT ANGIOGRAPHY CHEST WITH CONTRAST TECHNIQUE: Multidetector CT imaging of the chest was performed using the standard protocol during bolus administration of intravenous contrast. Multiplanar CT image reconstructions and MIPs were obtained to evaluate the vascular anatomy. RADIATION DOSE REDUCTION: This exam was performed according to the departmental dose-optimization program which includes automated exposure control, adjustment of the mA and/or kV according to patient size and/or use of iterative reconstruction technique. CONTRAST:  75mL OMNIPAQUE IOHEXOL 350 MG/ML SOLN COMPARISON:  AP Lat chest today, portable chest 03/08/2021, CTA chest 12/03/2020, CTA  chest 02/26/2020 FINDINGS: Cardiovascular: There is moderate panchamber cardiomegaly with left ventricular wall hypertrophy, slight right heart chamber enlargement. There is IVC and hepatic vein reflux not seen previously and could suggest elevated right heart pressures or tricuspid regurgitation. The pulmonary trunk is slightly prominent measuring 2.9 cm but no more than previously. No arterial embolism is seen. There are mild scattered aortic calcific plaques. Moderate patchy calcification of the LAD coronary artery and scattered plaques in the right and circumflex arteries. The aortic root is slightly dilated at the sinuses of Valsalva measuring 4.1 cm. The mid ascending segment measures 3.8 cm. The mid arch is stable at 3.4 cm. There is mild tortuosity. Opacification of the aorta and great vessels is insufficient to assess the lumen. There is no venous dilatation.  There is no pericardial effusion. Mediastinum/Nodes: No enlarged mediastinal, hilar, or axillary lymph nodes. Thyroid gland, trachea, and esophagus demonstrate no significant findings. Both of the main bronchi are clear. Lungs/Pleura: There is moderate to severe emphysematous disease with centrilobular changes predominating. No infiltrate or nodule is seen. There is no pleural effusion, thickening or  pneumothorax. Upper Abdomen: No acute abnormality. Abdominal aortic atherosclerosis. Musculoskeletal: No focal abnormality in the chest wall. No acute osseous findings or aggressive lesion. There are partially healed subacute fractures of the left anterior sixth and seventh ribs. Review of the MIP images confirms the above findings. IMPRESSION: 1. No evidence of arterial embolus. Slightly prominent pulmonary trunk. 2. Cardiomegaly with left ventricular wall hypertrophy, slight right heart chamber enlargement, IVC and hepatic vein reflux. Findings could be due to elevated right heart pressures or tricuspid regurgitation. No pulmonary edema or effusion. 3. Aortic and coronary artery atherosclerosis. 4. Emphysema. 5. Subacute partially healed fractures of the left anterior sixth and seventh ribs. Aortic Atherosclerosis (ICD10-I70.0) and Emphysema (ICD10-J43.9). Electronically Signed   By: Almira Bar M.D.   On: 04/20/2023 07:16   DG Chest 2 View  Result Date: 04/20/2023 CLINICAL DATA:  Chest pain, cough EXAM: CHEST - 2 VIEW COMPARISON:  03/08/2021 FINDINGS: Heart and mediastinal contours within normal limits. Increased markings in the lung bases, likely atelectasis. No effusions. No acute bony abnormality. IMPRESSION: Bibasilar opacities.  Favor atelectasis. Electronically Signed   By: Charlett Nose M.D.   On: 04/20/2023 03:51    Procedures Procedures    Medications Ordered in ED Medications  iohexol (OMNIPAQUE) 350 MG/ML injection 75 mL (75 mLs Intravenous Contrast Given 04/20/23 0656)    ED Course/ Medical Decision Making/ A&P                             Medical Decision Making Amount and/or Complexity of Data Reviewed Labs: ordered. Radiology: ordered.  Risk Prescription drug management.   Patient with history of HIV, PE on chronic anticoagulation, alcohol and tobacco use here for evaluation of 2 weeks of chest pain.  EKG is without acute ischemic changes and troponins are negative x 2.   Chest x-ray with bibasilar opacities.  Given his history of PE a CTA was obtained, which is negative for PE, pneumonia.  CT does demonstrate incidental findings.  No evidence of decompensated heart failure on examination.  Feel patient is stable for discharge home with PCP follow-up and return precautions.  Transaminases are mildly elevated-this is stable and similar compared to priors.  He has no abdominal tenderness on examination.  Current clinical picture is not consistent assistant with acute biliary or pancreatic pathology.  Final Clinical Impression(s) / ED Diagnoses Final diagnoses:  Atypical chest pain    Rx / DC Orders ED Discharge Orders     None         Tilden Fossa, MD 04/20/23 [phone]

## 2023-08-13 ENCOUNTER — Telehealth: Payer: Self-pay

## 2023-08-13 NOTE — Telephone Encounter (Signed)
Attempted to contact patient - no answer or voicemail to leave a message.

## 2023-08-13 NOTE — Telephone Encounter (Signed)
-----   Message from [place] sent at 08/13/2023  2:05 PM EST ----- Regarding: Missing patient  Hi team   Can someone do a call to check in with [name] Herring? I haven't seen him since March 2023 and wanted to see if we could invite him back to clinic for a check in.  He is always undetectable so unless he requests a refill may fall into a crack.   Thanks so much

## 2023-08-15 NOTE — Telephone Encounter (Signed)
See below

## 2023-08-15 NOTE — Telephone Encounter (Signed)
I spoke the patient and he is scheduled. Daughter was able to give me an updated phone number. Updated in epic Mollyann Halbert Jonathon Resides, CMA

## 2023-09-04 ENCOUNTER — Other Ambulatory Visit: Payer: Self-pay

## 2023-09-04 ENCOUNTER — Ambulatory Visit (INDEPENDENT_AMBULATORY_CARE_PROVIDER_SITE_OTHER): Payer: 59 | Admitting: Infectious Diseases

## 2023-09-04 ENCOUNTER — Encounter: Payer: Self-pay | Admitting: Infectious Diseases

## 2023-09-04 VITALS — BP 141/100 | HR 102 | Temp 97.6°F | Resp 16 | Wt 128.0 lb

## 2023-09-04 DIAGNOSIS — Z23 Encounter for immunization: Secondary | ICD-10-CM | POA: Diagnosis not present

## 2023-09-04 DIAGNOSIS — B20 Human immunodeficiency virus [HIV] disease: Secondary | ICD-10-CM

## 2023-09-04 DIAGNOSIS — F1010 Alcohol abuse, uncomplicated: Secondary | ICD-10-CM | POA: Diagnosis not present

## 2023-09-04 DIAGNOSIS — I10 Essential (primary) hypertension: Secondary | ICD-10-CM

## 2023-09-04 MED ORDER — BICTEGRAVIR-EMTRICITAB-TENOFOV 50-200-25 MG PO TABS: 1.0000 | ORAL_TABLET | Freq: Every day | ORAL | 11 refills | Status: AC

## 2023-09-04 MED ORDER — BICTEGRAVIR-EMTRICITAB-TENOFOV 50-200-25 MG PO TABS: 1.0000 | ORAL_TABLET | Freq: Every day | ORAL | 11 refills | Status: DC

## 2023-09-04 NOTE — Patient Instructions (Addendum)
So nice to see you!  Refills for your biktarvy have been sent in  You have your next appointment for your 2nd dose of the shingles vaccine in February and will see Judeth Cornfield again in June for follow up.   Tell Dr Katrinka Blazing I said hello when you see him next and have him recheck your blood pressure to make sure you have what you need for your medications.

## 2023-09-04 NOTE — Progress Notes (Signed)
Patient Name: [name] Herring  Date of Birth: [birth year]  MRN: [id number]  PCP: Raymon Mutton., FNP     SUBJECTIVE:  Brief Narrative:  [name] Herring is a 62 y.o. AA male with a history of HIV disease Dx 08/18/1998. Previously followed at [facility] ID clinic.  HIV Risk: MSM.  History of OIs: uncertain.  History of ETOH/cocaine abuse, previously homeless.    Previous Regimens:  Kaletra + Combivir  Genvoya > stopped d/t DDI with [facility] Biktarvy 2019 >> suppressed    No chief complaint on file.    Discussed the use of AI scribe software for clinical note transcription with the patient, who gave verbal consent to proceed.  History of Present Illness   [name] Herring, a patient with a history of psychiatric illness and hypertension, presents for a routine check-up. He reports no new health concerns and has been stable on his current medication regimen. He has been living comfortably in his current residence with adequate access to food. He admits to occasional alcohol consumption, but denies any loss of control over it.   [name] Herring has been compliant with his psychiatric medication and reports no recent hallucinations or voices. He last saw his regular doctor, Dr. Katrinka Blazing, approximately three months ago. He has been taking his blood pressure medication and blood thinner regularly. However, his blood pressure was noted to be slightly elevated during this visit.   [name] Herring also has a history of HIV, which he reports is well-controlled and in remission. He is currently on Biktarvy for this condition. He is also involved with the [facility] [facility].  In addition, [name] Herring has had shingles twice in his lifetime and has agreed to receive the shingles vaccine, which is administered in two doses. He has not yet received his flu shot for the current season, but has agreed to receive it during this visit. He has also received the COVID-19 vaccine.  [name] Herring's contact information and pharmacy details were updated  during this visit.       Review of Systems  Constitutional:  Negative for chills and fever.  HENT:  Negative for tinnitus.   Eyes:  Negative for blurred vision and photophobia.  Respiratory:  Negative for cough and sputum production.   Cardiovascular:  Negative for chest pain.  Gastrointestinal:  Negative for diarrhea, nausea and vomiting.  Genitourinary:  Negative for dysuria.  Skin:  Negative for rash.  Neurological:  Negative for headaches.     Past Medical History:  Diagnosis Date   Depression    HIV (human immunodeficiency virus infection) (HCC)    Hypertension    Immune deficiency disorder (HCC)    PE (pulmonary thromboembolism) (HCC) 2018   Personality disorder (HCC)    Schizophrenia (HCC)      Outpatient Medications Prior to Visit  Medication Sig Dispense Refill   amLODipine (NORVASC) 5 MG tablet Take 1 tablet (5 mg total) by mouth daily. 30 tablet 0   apixaban (ELIQUIS) 5 MG TABS tablet Take 1 tablet (5 mg total) by mouth 2 (two) times daily. 60 tablet 0   atorvastatin (LIPITOR) 40 MG tablet Take 40 mg by mouth at bedtime.     benztropine (COGENTIN) 1 MG tablet Take 1 tablet (1 mg total) by mouth 2 (two) times daily. For eps 60 tablet 0   Cholecalciferol 25 MCG (1000 UT) capsule Take by mouth.     fluticasone (FLONASE) 50 MCG/ACT nasal spray Place 1-2 sprays into both nostrils daily as needed for rhinitis.  gabapentin (NEURONTIN) 800 MG tablet Take 800 mg by mouth at bedtime.     lisinopril (ZESTRIL) 40 MG tablet Take 40 mg by mouth every morning.     losartan (COZAAR) 25 MG tablet Take 1 tablet (25 mg total) by mouth daily. 30 tablet 0   montelukast (SINGULAIR) 10 MG tablet Take 10 mg by mouth daily.     QUEtiapine (SEROQUEL) 25 MG tablet Take 1 tablet (25 mg total) by mouth at bedtime. For agitation 30 tablet 0   risperiDONE (RISPERDAL) 2 MG tablet Take 1 tablet (2 mg total) by mouth 2 (two) times daily. For mood control 60 tablet 0   SYMBICORT 160-4.5 MCG/ACT  inhaler Inhale 2 puffs into the lungs 2 (two) times daily.     valbenazine (INGREZZA) 80 MG capsule Take 1 capsule (80 mg total) by mouth at bedtime. For tardive dyskinesia 30 capsule 0   bictegravir-emtricitabine-tenofovir AF (BIKTARVY) 50-200-25 MG TABS tablet Take 1 tablet by mouth daily. 30 tablet 0   acetaminophen (TYLENOL) 500 MG tablet Take 1 tablet (500 mg total) by mouth every 6 (six) hours as needed for moderate pain. (Patient not taking: Reported on 09/04/2023) 30 tablet 0   mirtazapine (REMERON) 15 MG tablet Take 1 tablet (15 mg total) by mouth at bedtime. 30 tablet 0   pantoprazole (PROTONIX) 40 MG tablet Take 1 tablet (40 mg total) by mouth 2 (two) times daily. 60 tablet 0   traZODone (DESYREL) 50 MG tablet Take 1 tablet (50 mg total) by mouth at bedtime as needed for sleep. (Patient not taking: Reported on 09/04/2023) 30 tablet 0   No facility-administered medications prior to visit.   Allergies  Allergen Reactions   Amoxicillin Shortness Of Breath and Rash    Has patient had a PCN reaction causing immediate rash, facial/tongue/throat swelling, [name] Has patient had a PCN reaction causing severe rash involving mucus membranes or skin necrosis: no Has patient had a PCN reaction that required hospitalization: yes drs office visit Has patient had a PCN reaction occurring within the last 10 years: no If all of the above answers are "NO", then may proceed with Cephalosporin use.    Latex Shortness Of Breath   Abilify [Aripiprazole] Other (See Comments)    Double vision   Social History   Tobacco Use   Smoking status: Every Day    Current packs/day: 0.00    Average packs/day: 1 pack/day for 33.0 years (33.0 ttl pk-yrs)    Types: Cigarettes    Start date: 04/24/1986    Last attempt to quit: 04/25/2019    Years since quitting: 4.3   Smokeless tobacco: Never   Tobacco comments:    States cutting back  Vaping Use   Vaping status: Never  Used  Substance Use Topics   Alcohol use: Not Currently    Comment: states once a month   Drug use: Not Currently    Comment: Patient denies use, but UDS + cocaine   Objective:  Vitals:   09/04/23 1253  BP: (!) 141/100  Pulse: (!) 102  Resp: 16  Temp: 97.6 F (36.4 C)  TempSrc: Temporal  SpO2: 96%  Weight: 128 lb (58.1 kg)   Body mass index is 18.9 kg/m.  Physical Exam Constitutional:      Appearance: He is well-developed.     Comments: Thin appearing man.  Seated comfortably in the visit.  Pleasant.   HENT:     Mouth/Throat:     Dentition:  Normal dentition. No dental abscesses.  Cardiovascular:     Rate and Rhythm: Normal rate and regular rhythm.     Heart sounds: Normal heart sounds.  Pulmonary:     Effort: Pulmonary effort is normal.     Breath sounds: Normal breath sounds.  Abdominal:     General: There is no distension.     Palpations: Abdomen is soft.     Tenderness: There is no abdominal tenderness.  Lymphadenopathy:     Cervical: No cervical adenopathy.  Skin:    General: Skin is warm and dry.     Findings: No rash.  Neurological:     Mental Status: He is alert and oriented to person, place, and time.  Psychiatric:        Judgment: Judgment normal.     Comments: In good spirits today. Appears mildly anxious.     Lab Results Lab Results  Component Value Date   WBC 4.6 04/20/2023   HGB 13.0 04/20/2023   HCT 39.0 04/20/2023   MCV 106.8 (H) 04/20/2023   PLT 151 04/20/2023    Lab Results  Component Value Date   CREATININE 0.96 04/20/2023   BUN 18 04/20/2023   NA 139 04/20/2023   K 4.2 04/20/2023   CL 103 04/20/2023   CO2 24 04/20/2023    Lab Results  Component Value Date   ALT 45 (H) 04/20/2023   AST 52 (H) 04/20/2023   ALKPHOS 50 04/20/2023   BILITOT 0.7 04/20/2023    Lab Results  Component Value Date   CHOL 115 02/20/2021   HDL 59 02/20/2021   LDLCALC 42 02/20/2021   TRIG 69 02/20/2021   CHOLHDL 1.9 02/20/2021   HIV 1 RNA  Quant (Copies/mL)  Date Value  07/29/2021 64 (H)  11/30/2020 58 (H)  05/21/2020 34 (H)   CD4 T Cell Abs (/uL)  Date Value  11/30/2020 238 (L)  05/21/2020 256 (L)  01/22/2019 199 (L)   Lab Results  Component Value Date   HAV REACTIVE (A) 06/17/2018   Lab Results  Component Value Date   HEPBSAG NON-REACTIVE 06/17/2018   HEPBSAB BORDERLINE (A) 06/17/2018    ASSESSMENT & PLAN: Assessment and Plan    HIV -  Well controlled on Biktarvy. No concerns reported. Will update labs today and ensure he has refills.  he continues to work with [employer] and has adequate housing available to him at present. His daughter, Aldean Jewett also is local and helps him out. We encouraged him to bring his medications at his upcoming office visit so we can ensure no interactions.   -Discuss anal cancer screening at upcoming appt -On statin for secondary prevention  -No dental or mental health needs  -Ensure adequate refills on Biktarvy at [facility] pharmacy. -Bring medicines to next OV   Alcohol Use -  Reports occasional drinking, once or twice a night. No concerns of loss of control. -Continue monitoring. -LFTs today   Hypertension -  Blood pressure elevated today. Patient reports adherence to medication. -Advise patient to have blood pressure rechecked at next visit with Dr. Katrinka Blazing which is next month per his report   General Health Maintenance -Administer influenza vaccine today. -Administer first dose of shingles vaccine today. Schedule second dose for early February. -Schedule follow-up visit in six months.      Meds ordered this encounter  Medications   DISCONTD: bictegravir-emtricitabine-tenofovir AF (BIKTARVY) 50-200-25 MG TABS tablet    Sig: Take 1 tablet by mouth daily.    Dispense:  30  tablet    Refill:  11   bictegravir-emtricitabine-tenofovir AF (BIKTARVY) 50-200-25 MG TABS tablet    Sig: Take 1 tablet by mouth daily.    Dispense:  30 tablet    Refill:  11   Orders Placed This Encounter   Procedures   HIV 1 RNA quant-no reflex-bld   T-helper cells (CD4) count   COMPLETE METABOLIC PANEL WITH GFR   CBC    His appts have been scheduled for #2 shingrix in February 2025 and FU with me in June 2025.   Rexene Alberts, MSN, NP-C [facility]  [email or web address] Pager: [phone] Office: [phone] [facility] Main Line: [phone]    09/04/23

## 2023-09-05 LAB — T-HELPER CELLS (CD4) COUNT (NOT AT ARMC)
CD4 % Helper T Cell: 31 % — ABNORMAL LOW (ref 33–65)
CD4 T Cell Abs: 285 /uL — ABNORMAL LOW (ref 400–1790)

## 2023-09-07 LAB — COMPLETE METABOLIC PANEL WITH GFR
AG Ratio: 1.4 (calc) (ref 1.0–2.5)
ALT: 18 U/L (ref 9–46)
AST: 17 U/L (ref 10–35)
Albumin: 4.2 g/dL (ref 3.6–5.1)
Alkaline phosphatase (APISO): 74 U/L (ref 35–144)
BUN: 19 mg/dL (ref 7–25)
CO2: 24 mmol/L (ref 20–32)
Calcium: 9.8 mg/dL (ref 8.6–10.3)
Chloride: 109 mmol/L (ref 98–110)
Creat: 1.05 mg/dL (ref 0.70–1.35)
Globulin: 3 g/dL (ref 1.9–3.7)
Glucose, Bld: 88 mg/dL (ref 65–99)
Potassium: 3.7 mmol/L (ref 3.5–5.3)
Sodium: 141 mmol/L (ref 135–146)
Total Bilirubin: 0.5 mg/dL (ref 0.2–1.2)
Total Protein: 7.2 g/dL (ref 6.1–8.1)
eGFR: 80 mL/min/{1.73_m2} (ref >=60)

## 2023-09-07 LAB — CBC
HCT: 42 % (ref 38.5–50.0)
Hemoglobin: 14.1 g/dL (ref 13.2–17.1)
MCH: 33.4 pg — ABNORMAL HIGH (ref 27.0–33.0)
MCHC: 33.6 g/dL (ref 32.0–36.0)
MCV: 99.5 fL (ref 80.0–100.0)
MPV: 11.2 fL (ref 7.5–12.5)
Platelets: 144 10*3/uL (ref 140–400)
RBC: 4.22 10*6/uL (ref 4.20–5.80)
RDW: 13 % (ref 11.0–15.0)
WBC: 6 10*3/uL (ref 3.8–10.8)

## 2023-09-07 LAB — HIV-1 RNA QUANT-NO REFLEX-BLD
HIV 1 RNA Quant: <20 {copies}/mL — ABNORMAL HIGH
HIV-1 RNA Quant, Log: <1.3 {Log} — ABNORMAL HIGH

## 2023-09-10 ENCOUNTER — Telehealth: Payer: Self-pay

## 2023-09-10 NOTE — Telephone Encounter (Signed)
-----   Message from [place] sent at 09/10/2023 10:27 AM EST ----- Please call [name] Herring to let him know that his viral load remains undetectable. His liver function tests are normal which is good to see.  His cd4 count is nice and stable at 285  No changes from me and I hope he has a nice holiday.

## 2023-09-10 NOTE — Telephone Encounter (Signed)
Patient aware of results.   [name] Herring [name] Herring, CMA  

## 2023-11-01 ENCOUNTER — Ambulatory Visit: Payer: 59

## 2024-01-22 ENCOUNTER — Emergency Department (HOSPITAL_COMMUNITY)

## 2024-01-22 ENCOUNTER — Inpatient Hospital Stay (HOSPITAL_COMMUNITY)
Admission: EM | Admit: 2024-01-22 | Discharge: 2024-01-24 | DRG: 917 | Disposition: A | Discharge status: Home / Self Care | Attending: Internal Medicine | Admitting: Internal Medicine

## 2024-01-22 ENCOUNTER — Other Ambulatory Visit: Payer: Self-pay

## 2024-01-22 ENCOUNTER — Encounter (HOSPITAL_COMMUNITY): Payer: Self-pay

## 2024-01-22 DIAGNOSIS — N179 Acute kidney failure, unspecified: Secondary | ICD-10-CM | POA: Diagnosis not present

## 2024-01-22 DIAGNOSIS — B20 Human immunodeficiency virus [HIV] disease: Secondary | ICD-10-CM | POA: Diagnosis present

## 2024-01-22 DIAGNOSIS — W19XXXA Unspecified fall, initial encounter: Secondary | ICD-10-CM | POA: Diagnosis present

## 2024-01-22 DIAGNOSIS — I10 Essential (primary) hypertension: Secondary | ICD-10-CM | POA: Diagnosis present

## 2024-01-22 DIAGNOSIS — J449 Chronic obstructive pulmonary disease, unspecified: Secondary | ICD-10-CM | POA: Diagnosis present

## 2024-01-22 DIAGNOSIS — Z7951 Long term (current) use of inhaled steroids: Secondary | ICD-10-CM

## 2024-01-22 DIAGNOSIS — Z21 Asymptomatic human immunodeficiency virus [HIV] infection status: Secondary | ICD-10-CM | POA: Diagnosis present

## 2024-01-22 DIAGNOSIS — Z8659 Personal history of other mental and behavioral disorders: Secondary | ICD-10-CM

## 2024-01-22 DIAGNOSIS — I952 Hypotension due to drugs: Secondary | ICD-10-CM | POA: Diagnosis present

## 2024-01-22 DIAGNOSIS — R55 Syncope and collapse: Secondary | ICD-10-CM | POA: Insufficient documentation

## 2024-01-22 DIAGNOSIS — Z7901 Long term (current) use of anticoagulants: Secondary | ICD-10-CM

## 2024-01-22 DIAGNOSIS — I959 Hypotension, unspecified: Secondary | ICD-10-CM

## 2024-01-22 DIAGNOSIS — T464X1A Poisoning by angiotensin-converting-enzyme inhibitors, accidental (unintentional), initial encounter: Principal | ICD-10-CM | POA: Diagnosis present

## 2024-01-22 DIAGNOSIS — E785 Hyperlipidemia, unspecified: Secondary | ICD-10-CM | POA: Diagnosis present

## 2024-01-22 DIAGNOSIS — F17210 Nicotine dependence, cigarettes, uncomplicated: Secondary | ICD-10-CM | POA: Diagnosis present

## 2024-01-22 DIAGNOSIS — F209 Schizophrenia, unspecified: Secondary | ICD-10-CM | POA: Diagnosis present

## 2024-01-22 DIAGNOSIS — F32A Depression, unspecified: Secondary | ICD-10-CM | POA: Diagnosis present

## 2024-01-22 DIAGNOSIS — R739 Hyperglycemia, unspecified: Secondary | ICD-10-CM

## 2024-01-22 DIAGNOSIS — F609 Personality disorder, unspecified: Secondary | ICD-10-CM | POA: Diagnosis present

## 2024-01-22 DIAGNOSIS — E861 Hypovolemia: Secondary | ICD-10-CM | POA: Diagnosis present

## 2024-01-22 DIAGNOSIS — Z86711 Personal history of pulmonary embolism: Secondary | ICD-10-CM

## 2024-01-22 DIAGNOSIS — Z8679 Personal history of other diseases of the circulatory system: Secondary | ICD-10-CM

## 2024-01-22 DIAGNOSIS — Z91040 Latex allergy status: Secondary | ICD-10-CM

## 2024-01-22 DIAGNOSIS — E039 Hypothyroidism, unspecified: Secondary | ICD-10-CM | POA: Diagnosis present

## 2024-01-22 DIAGNOSIS — Z86718 Personal history of other venous thrombosis and embolism: Secondary | ICD-10-CM

## 2024-01-22 DIAGNOSIS — I472 Ventricular tachycardia, unspecified: Secondary | ICD-10-CM | POA: Diagnosis present

## 2024-01-22 DIAGNOSIS — Z95828 Presence of other vascular implants and grafts: Secondary | ICD-10-CM

## 2024-01-22 DIAGNOSIS — Y92009 Unspecified place in unspecified non-institutional (private) residence as the place of occurrence of the external cause: Secondary | ICD-10-CM

## 2024-01-22 DIAGNOSIS — G9341 Metabolic encephalopathy: Secondary | ICD-10-CM | POA: Diagnosis present

## 2024-01-22 LAB — CBC
HCT: 43.1 % (ref 39.0–52.0)
Hemoglobin: 14.1 g/dL (ref 13.0–17.0)
MCH: 33.8 pg (ref 26.0–34.0)
MCHC: 32.7 g/dL (ref 30.0–36.0)
MCV: 103.4 fL — ABNORMAL HIGH (ref 80.0–100.0)
Platelets: 182 10*3/uL (ref 150–400)
RBC: 4.17 MIL/uL — ABNORMAL LOW (ref 4.22–5.81)
RDW: 17.2 % — ABNORMAL HIGH (ref 11.5–15.5)
WBC: 6.2 10*3/uL (ref 4.0–10.5)
nRBC: 0 % (ref 0.0–0.2)

## 2024-01-22 LAB — COMPREHENSIVE METABOLIC PANEL WITH GFR
ALT: 23 U/L (ref 0–44)
AST: 39 U/L (ref 15–41)
Albumin: 4 g/dL (ref 3.5–5.0)
Alkaline Phosphatase: 46 U/L (ref 38–126)
Anion gap: 18 — ABNORMAL HIGH (ref 5–15)
BUN: 30 mg/dL — ABNORMAL HIGH (ref 8–23)
CO2: 25 mmol/L (ref 22–32)
Calcium: 10 mg/dL (ref 8.9–10.3)
Chloride: 99 mmol/L (ref 98–111)
Creatinine, Ser: 5.57 mg/dL — ABNORMAL HIGH (ref 0.61–1.24)
GFR, Estimated: 11 mL/min — ABNORMAL LOW (ref >60)
Glucose, Bld: 107 mg/dL — ABNORMAL HIGH (ref 70–99)
Potassium: 3.6 mmol/L (ref 3.5–5.1)
Sodium: 142 mmol/L (ref 135–145)
Total Bilirubin: 0.9 mg/dL (ref 0.0–1.2)
Total Protein: 7.2 g/dL (ref 6.5–8.1)

## 2024-01-22 LAB — I-STAT CHEM 8, ED
BUN: 32 mg/dL — ABNORMAL HIGH (ref 8–23)
Calcium, Ion: 1.11 mmol/L — ABNORMAL LOW (ref 1.15–1.40)
Chloride: 101 mmol/L (ref 98–111)
Creatinine, Ser: 5.8 mg/dL — ABNORMAL HIGH (ref 0.61–1.24)
Glucose, Bld: 105 mg/dL — ABNORMAL HIGH (ref 70–99)
HCT: 45 % (ref 39.0–52.0)
Hemoglobin: 15.3 g/dL (ref 13.0–17.0)
Potassium: 3.4 mmol/L — ABNORMAL LOW (ref 3.5–5.1)
Sodium: 140 mmol/L (ref 135–145)
TCO2: 26 mmol/L (ref 22–32)

## 2024-01-22 LAB — I-STAT CG4 LACTIC ACID, ED: Lactic Acid, Venous: 1.9 mmol/L (ref 0.5–1.9)

## 2024-01-22 MED ORDER — SODIUM CHLORIDE 0.9 % IV BOLUS
1000.0000 mL | Freq: Once | INTRAVENOUS | Status: AC
  Administered 2024-01-22: 1000 mL via INTRAVENOUS

## 2024-01-22 NOTE — Progress Notes (Signed)
 The ASCVD Risk score (Arnett DK, et al., 2019) failed to calculate for the following reasons:   The valid total cholesterol range is 130 to 320 mg/dL  [name] Herring, BSN, RN

## 2024-01-22 NOTE — ED Provider Notes (Signed)
 [place] [facility] Provider Note   CSN: [id number] Arrival date & time: 01/22/24  2159     History  Chief Complaint  Patient presents with   Loss of Consciousness    [name] Herring is a 63 y.o. male.  63 year old male presents after fall at home due to syncope.  Patient states he took an extra dose of his Zestril .  States that this was [name] intentional.  He denies any recent illnesses.  He has [name] had any chest pain or shortness of breath or abdominal pain.  No history of GI blood loss.  Denies any new extremity weakness or paresthesias.  No neck pain.  [name] Herring and struck his head.  Since that time, he has [name] had any emesis.  Mild headache.  He is on Eliquis  due to history of PE.  EMS called patient placed in a cervical collar and transported here       Home Medications Prior to Admission medications   Medication Sig Start Date End Date Taking? Authorizing Provider  acetaminophen  (TYLENOL ) 500 MG tablet Take 1 tablet (500 mg total) by mouth every 6 (six) hours as needed for moderate pain. Patient [name] taking: Reported on 09/04/2023 11/07/16   Armenta Landau, Herring  amLODipine  (NORVASC ) 5 MG tablet Take 1 tablet (5 mg total) by mouth daily. 03/17/21 12/07/25  Arrien, Curlee Doss, Herring  apixaban  (ELIQUIS ) 5 MG TABS tablet Take 1 tablet (5 mg total) by mouth 2 (two) times daily. 06/15/20   Orson Blalock, [name] Herring  atorvastatin  (LIPITOR) 40 MG tablet Take 40 mg by mouth at bedtime. 02/05/21   Provider, Historical, Herring  benztropine  (COGENTIN ) 1 MG tablet Take 1 tablet (1 mg total) by mouth 2 (two) times daily. For eps 02/24/21   Asuncion Layer I, [name] Herring  bictegravir-emtricitabine -tenofovir  AF (BIKTARVY ) 50-200-25 MG TABS tablet Take 1 tablet by mouth daily. 09/04/23   Orson Blalock, [name] Herring  Cholecalciferol 25 MCG (1000 UT) capsule Take by mouth.    Provider, Historical, Herring  fluticasone  (FLONASE ) 50 MCG/ACT nasal spray Place 1-2 sprays into both nostrils daily as  needed for rhinitis. 11/01/19   Provider, Historical, Herring  gabapentin  (NEURONTIN ) 800 MG tablet Take 800 mg by mouth at bedtime. 11/14/21   Provider, Historical, Herring  lisinopril  (ZESTRIL ) 40 MG tablet Take 40 mg by mouth every morning. 11/14/21   Provider, Historical, Herring  losartan  (COZAAR ) 25 MG tablet Take 1 tablet (25 mg total) by mouth daily. 03/17/21 09/04/23  Arrien, Mauricio Daniel, Herring  mirtazapine  (REMERON ) 15 MG tablet Take 1 tablet (15 mg total) by mouth at bedtime. 03/17/21 12/08/22  Arrien, Mauricio Daniel, Herring  montelukast  (SINGULAIR ) 10 MG tablet Take 10 mg by mouth daily. 02/23/22   Provider, Historical, Herring  pantoprazole  (PROTONIX ) 40 MG tablet Take 1 tablet (40 mg total) by mouth 2 (two) times daily. 03/17/21 06/09/22  Arrien, Mauricio Daniel, Herring  QUEtiapine  (SEROQUEL ) 25 MG tablet Take 1 tablet (25 mg total) by mouth at bedtime. For agitation 02/24/21   Asuncion Layer I, [name] Herring  risperiDONE  (RISPERDAL ) 2 MG tablet Take 1 tablet (2 mg total) by mouth 2 (two) times daily. For mood control 02/24/21   Asuncion Layer I, [name] Herring  SYMBICORT  160-4.5 MCG/ACT inhaler Inhale 2 puffs into [name] lungs 2 (two) times daily. 10/03/19   Provider, Historical, Herring  traZODone  (DESYREL ) 50 MG tablet Take 1 tablet (50 mg total) by mouth at bedtime as needed for sleep. Patient [name] taking: Reported on 09/04/2023  02/24/21   Asuncion Layer I, [name] Herring  valbenazine  (INGREZZA ) 80 MG capsule Take 1 capsule (80 mg total) by mouth at bedtime. For tardive dyskinesia 02/24/21   Asuncion Layer I, [name] Herring  cetirizine  (ZYRTEC ) 10 MG tablet Take 1 tablet (10 mg total) by mouth daily. Patient [name] taking: Reported on 10/22/2019 02/26/18 11/15/19  Newlin, Enobong, Herring  haloperidol  (HALDOL ) 10 MG tablet Take 0.5 tablets (5 mg total) by mouth 2 (two) times daily. Patient [name] taking: Reported on 10/22/2019 06/18/17 11/15/19  Deforest Fast, Herring      Allergies    Amoxicillin, Latex, and Abilify  [aripiprazole ]    Review of Systems   Review of Systems  All other systems reviewed and  are negative.   Physical Exam Updated Vital Signs BP (!) 67/52   Pulse 82   Temp 98.2 F (36.8 C) (Oral)   Resp 20   Ht 1.753 m (5\' 9" )   Wt 58.1 kg   SpO2 95%   BMI 18.92 kg/m  Physical Exam Vitals and nursing note reviewed.  Constitutional:      General: He is [name] in acute distress.    Appearance: Normal appearance. He is well-developed. He is [name] toxic-appearing.  HENT:     Head: Normocephalic and atraumatic.  Eyes:     General: Lids are normal.     Conjunctiva/sclera: Conjunctivae normal.     Pupils: Pupils are equal, round, and reactive to light.  Neck:     Thyroid : No thyroid  mass.     Trachea: No tracheal deviation.  Cardiovascular:     Rate and Rhythm: Normal rate and regular rhythm.     Heart sounds: Normal heart sounds. No murmur heard.    No gallop.  Pulmonary:     Effort: Pulmonary effort is normal. No respiratory distress.     Breath sounds: Normal breath sounds. No stridor. No decreased breath sounds, wheezing, rhonchi or rales.  Abdominal:     General: There is no distension.     Palpations: Abdomen is soft.     Tenderness: There is no abdominal tenderness. There is no rebound.  Musculoskeletal:        General: No tenderness. Normal range of motion.     Cervical back: Normal range of motion and neck supple.  Skin:    General: Skin is warm and dry.     Findings: No abrasion or rash.  Neurological:     General: No focal deficit present.     Mental Status: He is alert and oriented to person, place, and time. Mental status is at baseline.     GCS: GCS eye subscore is 4. GCS verbal subscore is 5. GCS motor subscore is 6.     Cranial Nerves: No cranial nerve deficit.     Sensory: Sensation is intact. No sensory deficit.     Motor: Motor function is intact.  Psychiatric:        Attention and Perception: Attention normal.        Speech: Speech normal.        Behavior: Behavior normal.     ED Results / Procedures / Treatments   Labs (all labs  ordered are listed, but only abnormal results are displayed) Labs Reviewed  I-STAT CHEM 8, ED - Abnormal; Notable for [name] following components:      Result Value   Potassium 3.4 (*)    BUN 32 (*)    Creatinine, Ser 5.80 (*)    Glucose, Bld 105 (*)  Calcium , Ion 1.11 (*)    All other components within normal limits  CBC  COMPREHENSIVE METABOLIC PANEL WITH GFR  I-STAT CG4 LACTIC ACID, ED    EKG EKG Interpretation Date/Time:  Tuesday January 22 2024 22:23:01 EDT Ventricular Rate:  80 PR Interval:  137 QRS Duration:  98 QT Interval:  397 QTC Calculation: 458 R Axis:   74  Text Interpretation: Sinus rhythm Probable left ventricular hypertrophy No significant change since last tracing Confirmed by Lind Repine ([id number]) on 01/22/2024 10:29:17 PM  Radiology No results found.  Procedures Procedures    Medications Ordered in ED Medications  sodium chloride  0.9 % bolus 1,000 mL (has no administration in time range)  sodium chloride  0.9 % bolus 1,000 mL (1,000 mLs Intravenous New Bag/Given 01/22/24 2218)    ED Course/ Medical Decision Making/ A&P                                 Medical Decision Making Amount and/or Complexity of Data Reviewed Labs: ordered. Radiology: ordered.   Patient's EKG shows normal sinus rhythm.  Patient hypotensive.  Blood pressure 65/30.  Giving IV fluids.  Bedside creatinine is 5.8.  Review of our records show that patient's most recent creatinine was normal about 5 months ago.  Continue to aggressively fluid rehydrate here.  Patient need to have a CT of his head due to history of trauma as well as being on Eliquis .  Will sign out to next provider Dr. Candelaria Chaco        Final Clinical Impression(s) / ED Diagnoses Final diagnoses:  None    Rx / DC Orders ED Discharge Orders     None         Lind Repine, Herring 01/22/24 2230

## 2024-01-22 NOTE — ED Notes (Addendum)
 Patient transported to CT

## 2024-01-22 NOTE — ED Provider Notes (Signed)
 Care assumed from Dr. Leighton Punches, patient with acute kidney injury, hypotension. He has CT head pending.  Blood pressure had improved with 2 L of normal saline, and lactic acid level was normal.  However, blood pressure started to trend down and I ordered additional IV fluids and blood pressure has improved.  CT of head and cervical spine showed no evidence of acute injury.  I have independently viewed the images, and agree with radiologist's interpretation.  I have discussed the case with Dr. Sundil of [facility], who agrees to admit the patient.  Results for orders placed or performed during the hospital encounter of 01/22/24  CBC   Collection Time: 01/22/24 10:15 PM  Result Value Ref Range   WBC 6.2 4.0 - 10.5 K/uL   RBC 4.17 (L) 4.22 - 5.81 MIL/uL   Hemoglobin 14.1 13.0 - 17.0 g/dL   HCT 16.1 09.6 - 04.5 %   MCV 103.4 (H) 80.0 - 100.0 fL   MCH 33.8 26.0 - 34.0 pg   MCHC 32.7 30.0 - 36.0 g/dL   RDW 40.9 (H) 81.1 - 91.4 %   Platelets 182 150 - 400 K/uL   nRBC 0.0 0.0 - 0.2 %  Comprehensive metabolic panel   Collection Time: 01/22/24 10:15 PM  Result Value Ref Range   Sodium 142 135 - 145 mmol/L   Potassium 3.6 3.5 - 5.1 mmol/L   Chloride 99 98 - 111 mmol/L   CO2 25 22 - 32 mmol/L   Glucose, Bld 107 (H) 70 - 99 mg/dL   BUN 30 (H) 8 - 23 mg/dL   Creatinine, Ser 7.82 (H) 0.61 - 1.24 mg/dL   Calcium  10.0 8.9 - 10.3 mg/dL   Total Protein 7.2 6.5 - 8.1 g/dL   Albumin  4.0 3.5 - 5.0 g/dL   AST 39 15 - 41 U/L   ALT 23 0 - 44 U/L   Alkaline Phosphatase 46 38 - 126 U/L   Total Bilirubin 0.9 0.0 - 1.2 mg/dL   GFR, Estimated 11 (L) >60 mL/min   Anion gap 18 (H) 5 - 15  I-stat chem 8, ED (not at [facility], [facility] or [facility])   Collection Time: 01/22/24 10:21 PM  Result Value Ref Range   Sodium 140 135 - 145 mmol/L   Potassium 3.4 (L) 3.5 - 5.1 mmol/L   Chloride 101 98 - 111 mmol/L   BUN 32 (H) 8 - 23 mg/dL   Creatinine, Ser 9.56 (H) 0.61 - 1.24 mg/dL   Glucose, Bld 213 (H) 70 - 99 mg/dL    Calcium , Ion 1.11 (L) 1.15 - 1.40 mmol/L   TCO2 26 22 - 32 mmol/L   Hemoglobin 15.3 13.0 - 17.0 g/dL   HCT 08.6 57.8 - 46.9 %  I-Stat CG4 Lactic Acid   Collection Time: 01/22/24 10:23 PM  Result Value Ref Range   Lactic Acid, Venous 1.9 0.5 - 1.9 mmol/L   CT Head Wo Contrast Result Date: 01/22/2024 CLINICAL DATA:  Head trauma, moderate-severe; Neck trauma, dangerous injury mechanism (Age 58-64y) Fall EXAM: CT HEAD WITHOUT CONTRAST CT CERVICAL SPINE WITHOUT CONTRAST TECHNIQUE: Multidetector CT imaging of the head and cervical spine was performed following the standard protocol without intravenous contrast. Multiplanar CT image reconstructions of the cervical spine were also generated. RADIATION DOSE REDUCTION: This exam was performed according to the departmental dose-optimization program which includes automated exposure control, adjustment of the mA and/or kV according to patient size and/or use of iterative reconstruction technique. COMPARISON:  CT head C-spine 03/08/2021  FINDINGS: CT HEAD FINDINGS Brain: Patchy and confluent areas of decreased attenuation are noted throughout the deep and periventricular white matter of the cerebral hemispheres bilaterally, compatible with chronic microvascular ischemic disease. No evidence of large-territorial acute infarction. No parenchymal hemorrhage. No mass lesion. No extra-axial collection. No mass effect or midline shift. No hydrocephalus. Basilar cisterns are patent. Vascular: No hyperdense vessel. Atherosclerotic calcifications are present within the cavernous internal carotid arteries. Skull: No acute fracture or focal lesion. Sinuses/Orbits: Polypoid-like mucosal thickening of the left maxilla. Otherwise paranasal sinuses and mastoid air cells are clear. The orbits are unremarkable. Other: None. CT CERVICAL SPINE FINDINGS Alignment: Normal. Skull base and vertebrae: Moderate degenerative change of the spine no acute fracture. No aggressive appearing focal  osseous lesion or focal pathologic process. Soft tissues and spinal canal: No prevertebral fluid or swelling. No visible canal hematoma. Upper chest: Emphysema changes. Biapical pleural/pulmonary scarring. Other: None. IMPRESSION: 1. No acute intracranial abnormality. 2. No acute displaced fracture or traumatic listhesis of the cervical spine. 3.  Emphysema (ICD10-J43.9). Electronically Signed   By: Morgane  Naveau M.D.   On: 01/22/2024 23:06   CT Cervical Spine Wo Contrast Result Date: 01/22/2024 CLINICAL DATA:  Head trauma, moderate-severe; Neck trauma, dangerous injury mechanism (Age 48-64y) Fall EXAM: CT HEAD WITHOUT CONTRAST CT CERVICAL SPINE WITHOUT CONTRAST TECHNIQUE: Multidetector CT imaging of the head and cervical spine was performed following the standard protocol without intravenous contrast. Multiplanar CT image reconstructions of the cervical spine were also generated. RADIATION DOSE REDUCTION: This exam was performed according to the departmental dose-optimization program which includes automated exposure control, adjustment of the mA and/or kV according to patient size and/or use of iterative reconstruction technique. COMPARISON:  CT head C-spine 03/08/2021 FINDINGS: CT HEAD FINDINGS Brain: Patchy and confluent areas of decreased attenuation are noted throughout the deep and periventricular white matter of the cerebral hemispheres bilaterally, compatible with chronic microvascular ischemic disease. No evidence of large-territorial acute infarction. No parenchymal hemorrhage. No mass lesion. No extra-axial collection. No mass effect or midline shift. No hydrocephalus. Basilar cisterns are patent. Vascular: No hyperdense vessel. Atherosclerotic calcifications are present within the cavernous internal carotid arteries. Skull: No acute fracture or focal lesion. Sinuses/Orbits: Polypoid-like mucosal thickening of the left maxilla. Otherwise paranasal sinuses and mastoid air cells are clear. The orbits  are unremarkable. Other: None. CT CERVICAL SPINE FINDINGS Alignment: Normal. Skull base and vertebrae: Moderate degenerative change of the spine no acute fracture. No aggressive appearing focal osseous lesion or focal pathologic process. Soft tissues and spinal canal: No prevertebral fluid or swelling. No visible canal hematoma. Upper chest: Emphysema changes. Biapical pleural/pulmonary scarring. Other: None. IMPRESSION: 1. No acute intracranial abnormality. 2. No acute displaced fracture or traumatic listhesis of the cervical spine. 3.  Emphysema (ICD10-J43.9). Electronically Signed   By: Morgane  Naveau M.D.   On: 01/22/2024 23:06        Alissa April, MD 01/23/24 0110

## 2024-01-22 NOTE — ED Triage Notes (Signed)
 Pt BIB [facility] d/t syncope x3 with hypotension - 62/44.  Pt took Lisinopril  x2 accidentally - usually only takes 1.  Last took @1200 

## 2024-01-23 ENCOUNTER — Encounter (HOSPITAL_COMMUNITY): Payer: Self-pay | Admitting: Internal Medicine

## 2024-01-23 DIAGNOSIS — Z86718 Personal history of other venous thrombosis and embolism: Secondary | ICD-10-CM

## 2024-01-23 DIAGNOSIS — F209 Schizophrenia, unspecified: Secondary | ICD-10-CM | POA: Diagnosis present

## 2024-01-23 DIAGNOSIS — F32A Depression, unspecified: Secondary | ICD-10-CM | POA: Diagnosis present

## 2024-01-23 DIAGNOSIS — Z86711 Personal history of pulmonary embolism: Secondary | ICD-10-CM | POA: Diagnosis not present

## 2024-01-23 DIAGNOSIS — I472 Ventricular tachycardia, unspecified: Secondary | ICD-10-CM | POA: Diagnosis present

## 2024-01-23 DIAGNOSIS — G9341 Metabolic encephalopathy: Secondary | ICD-10-CM

## 2024-01-23 DIAGNOSIS — E861 Hypovolemia: Secondary | ICD-10-CM

## 2024-01-23 DIAGNOSIS — Z7901 Long term (current) use of anticoagulants: Secondary | ICD-10-CM | POA: Diagnosis not present

## 2024-01-23 DIAGNOSIS — Z91040 Latex allergy status: Secondary | ICD-10-CM | POA: Diagnosis not present

## 2024-01-23 DIAGNOSIS — W19XXXA Unspecified fall, initial encounter: Secondary | ICD-10-CM | POA: Diagnosis present

## 2024-01-23 DIAGNOSIS — F609 Personality disorder, unspecified: Secondary | ICD-10-CM | POA: Diagnosis present

## 2024-01-23 DIAGNOSIS — J449 Chronic obstructive pulmonary disease, unspecified: Secondary | ICD-10-CM | POA: Diagnosis present

## 2024-01-23 DIAGNOSIS — Z8679 Personal history of other diseases of the circulatory system: Secondary | ICD-10-CM | POA: Diagnosis not present

## 2024-01-23 DIAGNOSIS — E039 Hypothyroidism, unspecified: Secondary | ICD-10-CM | POA: Diagnosis present

## 2024-01-23 DIAGNOSIS — Z7951 Long term (current) use of inhaled steroids: Secondary | ICD-10-CM | POA: Diagnosis not present

## 2024-01-23 DIAGNOSIS — I10 Essential (primary) hypertension: Secondary | ICD-10-CM | POA: Diagnosis present

## 2024-01-23 DIAGNOSIS — J410 Simple chronic bronchitis: Secondary | ICD-10-CM

## 2024-01-23 DIAGNOSIS — T464X1A Poisoning by angiotensin-converting-enzyme inhibitors, accidental (unintentional), initial encounter: Secondary | ICD-10-CM | POA: Diagnosis present

## 2024-01-23 DIAGNOSIS — E785 Hyperlipidemia, unspecified: Secondary | ICD-10-CM | POA: Diagnosis present

## 2024-01-23 DIAGNOSIS — R55 Syncope and collapse: Secondary | ICD-10-CM | POA: Diagnosis not present

## 2024-01-23 DIAGNOSIS — Z21 Asymptomatic human immunodeficiency virus [HIV] infection status: Secondary | ICD-10-CM

## 2024-01-23 DIAGNOSIS — I952 Hypotension due to drugs: Secondary | ICD-10-CM | POA: Diagnosis present

## 2024-01-23 DIAGNOSIS — Y92009 Unspecified place in unspecified non-institutional (private) residence as the place of occurrence of the external cause: Secondary | ICD-10-CM | POA: Diagnosis not present

## 2024-01-23 DIAGNOSIS — F17210 Nicotine dependence, cigarettes, uncomplicated: Secondary | ICD-10-CM | POA: Insufficient documentation

## 2024-01-23 DIAGNOSIS — N179 Acute kidney failure, unspecified: Secondary | ICD-10-CM | POA: Diagnosis present

## 2024-01-23 DIAGNOSIS — B20 Human immunodeficiency virus [HIV] disease: Secondary | ICD-10-CM | POA: Diagnosis present

## 2024-01-23 DIAGNOSIS — Z95828 Presence of other vascular implants and grafts: Secondary | ICD-10-CM | POA: Diagnosis not present

## 2024-01-23 DIAGNOSIS — I959 Hypotension, unspecified: Secondary | ICD-10-CM

## 2024-01-23 LAB — CBC
HCT: 42.5 % (ref 39.0–52.0)
Hemoglobin: 14 g/dL (ref 13.0–17.0)
MCH: 34.3 pg — ABNORMAL HIGH (ref 26.0–34.0)
MCHC: 32.9 g/dL (ref 30.0–36.0)
MCV: 104.2 fL — ABNORMAL HIGH (ref 80.0–100.0)
Platelets: 142 10*3/uL — ABNORMAL LOW (ref 150–400)
RBC: 4.08 MIL/uL — ABNORMAL LOW (ref 4.22–5.81)
RDW: 17 % — ABNORMAL HIGH (ref 11.5–15.5)
WBC: 4.6 10*3/uL (ref 4.0–10.5)
nRBC: 0 % (ref 0.0–0.2)

## 2024-01-23 LAB — COMPREHENSIVE METABOLIC PANEL WITH GFR
ALT: 24 U/L (ref 0–44)
AST: 45 U/L — ABNORMAL HIGH (ref 15–41)
Albumin: 3.4 g/dL — ABNORMAL LOW (ref 3.5–5.0)
Alkaline Phosphatase: 43 U/L (ref 38–126)
Anion gap: 13 (ref 5–15)
BUN: 29 mg/dL — ABNORMAL HIGH (ref 8–23)
CO2: 23 mmol/L (ref 22–32)
Calcium: 8.9 mg/dL (ref 8.9–10.3)
Chloride: 104 mmol/L (ref 98–111)
Creatinine, Ser: 4.19 mg/dL — ABNORMAL HIGH (ref 0.61–1.24)
GFR, Estimated: 15 mL/min — ABNORMAL LOW (ref >60)
Glucose, Bld: 90 mg/dL (ref 70–99)
Potassium: 4.1 mmol/L (ref 3.5–5.1)
Sodium: 140 mmol/L (ref 135–145)
Total Bilirubin: 1 mg/dL (ref 0.0–1.2)
Total Protein: 6.3 g/dL — ABNORMAL LOW (ref 6.5–8.1)

## 2024-01-23 LAB — URINALYSIS, ROUTINE W REFLEX MICROSCOPIC
Bilirubin Urine: NEGATIVE
Glucose, UA: NEGATIVE mg/dL
Ketones, ur: NEGATIVE mg/dL
Nitrite: POSITIVE — AB
Protein, ur: 30 mg/dL — AB
Specific Gravity, Urine: 1.021 (ref 1.005–1.030)
pH: 5 (ref 5.0–8.0)

## 2024-01-23 LAB — SODIUM, URINE, RANDOM: Sodium, Ur: 37 mmol/L

## 2024-01-23 LAB — ETHANOL: Alcohol, Ethyl (B): <15 mg/dL (ref <15)

## 2024-01-23 LAB — RAPID URINE DRUG SCREEN, HOSP PERFORMED
Amphetamines: NOT DETECTED
Barbiturates: NOT DETECTED
Benzodiazepines: NOT DETECTED
Cocaine: NOT DETECTED
Opiates: NOT DETECTED
Tetrahydrocannabinol: NOT DETECTED

## 2024-01-23 LAB — TSH: TSH: 1.152 u[IU]/mL (ref 0.350–4.500)

## 2024-01-23 LAB — I-STAT CG4 LACTIC ACID, ED: Lactic Acid, Venous: 1.2 mmol/L (ref 0.5–1.9)

## 2024-01-23 LAB — AMMONIA: Ammonia: 47 umol/L — ABNORMAL HIGH (ref 9–35)

## 2024-01-23 LAB — CREATININE, URINE, RANDOM: Creatinine, Urine: 298 mg/dL

## 2024-01-23 MED ORDER — SODIUM CHLORIDE 0.9% FLUSH
3.0000 mL | Freq: Two times a day (BID) | INTRAVENOUS | Status: DC
  Administered 2024-01-23 – 2024-01-24 (×3): 3 mL via INTRAVENOUS

## 2024-01-23 MED ORDER — APIXABAN 5 MG PO TABS: 5.0000 mg | ORAL_TABLET | Freq: Two times a day (BID) | ORAL | Status: DC

## 2024-01-23 MED ORDER — ACETAMINOPHEN 650 MG RE SUPP: 650.0000 mg | Freq: Four times a day (QID) | RECTAL | Status: DC | PRN

## 2024-01-23 MED ORDER — SODIUM CHLORIDE 0.9 % IV SOLN: 250.0000 mL | INTRAVENOUS | Status: DC | PRN

## 2024-01-23 MED ORDER — MONTELUKAST SODIUM 10 MG PO TABS
10.0000 mg | ORAL_TABLET | Freq: Every day | ORAL | Status: DC
  Administered 2024-01-23 – 2024-01-24 (×2): 10 mg via ORAL
  Filled 2024-01-23 (×2): qty 1

## 2024-01-23 MED ORDER — ONDANSETRON HCL 4 MG PO TABS: 4.0000 mg | ORAL_TABLET | Freq: Four times a day (QID) | ORAL | Status: DC | PRN

## 2024-01-23 MED ORDER — MOMETASONE FURO-FORMOTEROL FUM 200-5 MCG/ACT IN AERO
2.0000 | INHALATION_SPRAY | Freq: Two times a day (BID) | RESPIRATORY_TRACT | Status: DC
  Administered 2024-01-23 – 2024-01-24 (×2): 2 via RESPIRATORY_TRACT
  Filled 2024-01-23 (×2): qty 8.8

## 2024-01-23 MED ORDER — LACTATED RINGERS IV SOLN: INTRAVENOUS | Status: DC

## 2024-01-23 MED ORDER — LACTULOSE 10 GM/15ML PO SOLN
10.0000 g | Freq: Three times a day (TID) | ORAL | Status: AC
  Administered 2024-01-23 (×3): 10 g via ORAL
  Filled 2024-01-23 (×3): qty 15

## 2024-01-23 MED ORDER — APIXABAN 5 MG PO TABS
5.0000 mg | ORAL_TABLET | Freq: Two times a day (BID) | ORAL | Status: DC
  Administered 2024-01-23 – 2024-01-24 (×3): 5 mg via ORAL
  Filled 2024-01-23 (×3): qty 1

## 2024-01-23 MED ORDER — ONDANSETRON HCL 4 MG/2ML IJ SOLN: 4.0000 mg | Freq: Four times a day (QID) | INTRAMUSCULAR | Status: DC | PRN

## 2024-01-23 MED ORDER — SODIUM CHLORIDE 0.9% FLUSH: 3.0000 mL | INTRAVENOUS | Status: DC | PRN

## 2024-01-23 MED ORDER — ATORVASTATIN CALCIUM 40 MG PO TABS
40.0000 mg | ORAL_TABLET | Freq: Every day | ORAL | Status: DC
Start: 2024-01-23 — End: 2024-01-24
  Administered 2024-01-23: 40 mg via ORAL
  Filled 2024-01-23: qty 1

## 2024-01-23 MED ORDER — ACETAMINOPHEN 325 MG PO TABS
650.0000 mg | ORAL_TABLET | Freq: Four times a day (QID) | ORAL | Status: DC | PRN
  Administered 2024-01-23: 650 mg via ORAL
  Filled 2024-01-23: qty 2

## 2024-01-23 MED ORDER — BICTEGRAVIR-EMTRICITAB-TENOFOV 50-200-25 MG PO TABS
1.0000 | ORAL_TABLET | Freq: Every day | ORAL | Status: DC
  Administered 2024-01-23 – 2024-01-24 (×2): 1 via ORAL
  Filled 2024-01-23 (×2): qty 1

## 2024-01-23 MED ORDER — LACTATED RINGERS IV BOLUS
1000.0000 mL | Freq: Once | INTRAVENOUS | Status: AC
  Administered 2024-01-23: 1000 mL via INTRAVENOUS

## 2024-01-23 NOTE — H&P (Signed)
 History and Physical    [name] Herring ZOX:[id number] DOB: 03-29-1961 DOA: 01/22/2024  PCP: Shannan Dart., FNP   Patient coming from: Home   Chief Complaint:  Chief Complaint  Patient presents with   Loss of Consciousness   ED TRIAGE note:  Pt BIB [facility] d/t syncope x3 with hypotension - 62/44.  Pt took Lisinopril  x2 accidentally - usually only takes 1.  Last took @1200       HPI:  [name] Herring is a 63 y.o. male with medical history significant of HIV, essential hypertension, DVT and PE status post IVC filter 2018, COPD, depression, chronic smoking, schizophrenia, chronic alcohol use, personality disorder, GERD, HIV associated severe protein calorie malnutrition, dyslipidemia and hypothyroidism presented to emergency department via EMS for evaluation for hypotension as patient accidentally took lisinopril  to try 2 tablets.   At presentation to ED patient was hypotensive blood pressure was 65/52 which has been improved to 98/69 after giving total 2 L of NS bolus.  During my evaluation at the bedside patient is drowsy/sleepy.  Able to wake patient up and stating that he took 3 blood pressure regimen all at the same time.  History limited from the patient.  Denies any headache, blurry vision, chest pain, shortness of breath and palpitation.  Reported he is compliant with Biktarvy  and Eliquis .   CBC unremarkable.   CMP showing elevated creatinine 5.57 and elevated anion gap 18.  At baseline patient has normal renal function Lactic acid within normal range.  CT head no acute intracranial abnormality.  CT cervical spine no evidence of fracture and traumatic listhesis. EKG showing normal sinus rhythm heart rate 80 and ventricular hypertrophy pattern.  Hospitalist has consulted for further evaluation and management of acute kidney injury in the setting of hypotension and hypotension due to antihypertensive medication overdose by accident.  Significant labs in the ED: Lab Orders          CBC         Comprehensive metabolic panel         Urinalysis, Routine w reflex microscopic -Urine, Clean Catch         Creatinine, urine, random         Sodium, urine, random         CBC         Comprehensive metabolic panel         Ethanol         Rapid urine drug screen (hospital performed)         TSH         Ammonia         Drug Screen 10 W/Conf, Serum         I-Stat CG4 Lactic Acid         I-stat chem 8, ED (not at [facility], [facility] or [facility])       Review of Systems:  Review of Systems  Constitutional:  Negative for malaise/fatigue and weight loss.  Respiratory:  Negative for cough, sputum production and shortness of breath.   Cardiovascular:  Negative for chest pain and palpitations.  Neurological:  Positive for dizziness and loss of consciousness. Negative for tingling, tremors, sensory change, speech change, focal weakness, seizures, weakness and headaches.  Psychiatric/Behavioral:  The patient is not nervous/anxious.     Past Medical History:  Diagnosis Date   Depression    HIV (human immunodeficiency virus infection) (HCC)    Hypertension    Immune deficiency disorder (HCC)    PE (pulmonary  thromboembolism) (HCC) 2018   Personality disorder (HCC)    Schizophrenia (HCC)     Past Surgical History:  Procedure Laterality Date   COLONOSCOPY WITH PROPOFOL  N/A 06/15/2017   Procedure: COLONOSCOPY WITH PROPOFOL ;  Surgeon: Tobin Forts, MD;  Location: [facility];  Service: Endoscopy;  Laterality: N/A;   ESOPHAGOGASTRODUODENOSCOPY (EGD) WITH PROPOFOL  N/A 06/15/2017   Procedure: ESOPHAGOGASTRODUODENOSCOPY (EGD) WITH PROPOFOL ;  Surgeon: Tobin Forts, MD;  Location: [facility];  Service: Endoscopy;  Laterality: N/A;   IR GENERIC HISTORICAL  11/04/2016   IR IVC FILTER PLMT / S&I Dan Dun GUID/MOD SED 11/04/2016 Erica Hau, MD [facility]     reports that he has been smoking cigarettes. He started smoking about 37 years ago. He has a 33 pack-year smoking history. He has never  used smokeless tobacco. He reports that he does not currently use alcohol. He reports that he does not currently use drugs.  Allergies  Allergen Reactions   Amoxicillin Shortness Of Breath and Rash    Has patient had a PCN reaction causing immediate rash, facial/tongue/throat swelling, [name] Has patient had a PCN reaction causing severe rash involving mucus membranes or skin necrosis: no Has patient had a PCN reaction that required hospitalization: yes drs office visit Has patient had a PCN reaction occurring within the last 10 years: no If all of the above answers are "NO", then may proceed with Cephalosporin use.    Latex Shortness Of Breath   Abilify  [Aripiprazole ] Other (See Comments)    Double vision    Family History  Problem Relation Age of Onset   CAD Mother    Kidney disease Sister    Lupus Brother    Cancer Maternal Grandmother    Stroke Paternal Grandmother     Prior to Admission medications   Medication Sig Start Date End Date Taking? Authorizing Provider  acetaminophen  (TYLENOL ) 500 MG tablet Take 1 tablet (500 mg total) by mouth every 6 (six) hours as needed for moderate pain. Patient not taking: Reported on 09/04/2023 11/07/16   Armenta Landau, MD  amLODipine  (NORVASC ) 5 MG tablet Take 1 tablet (5 mg total) by mouth daily. 03/17/21 12/07/25  Arrien, Curlee Doss, MD  apixaban  (ELIQUIS ) 5 MG TABS tablet Take 1 tablet (5 mg total) by mouth 2 (two) times daily. 06/15/20   Orson Blalock, NP  atorvastatin  (LIPITOR) 40 MG tablet Take 40 mg by mouth at bedtime. 02/05/21   [provider]  benztropine  (COGENTIN ) 1 MG tablet Take 1 tablet (1 mg total) by mouth 2 (two) times daily. For eps 02/24/21   Asuncion Layer I, NP  bictegravir-emtricitabine -tenofovir  AF (BIKTARVY ) 50-200-25 MG TABS tablet Take 1 tablet by mouth daily. 09/04/23   Orson Blalock, NP  Cholecalciferol 25 MCG (1000 UT) capsule Take by mouth.    [provider]  fluticasone  (FLONASE ) 50 MCG/ACT nasal spray Place 1-2 sprays into both nostrils daily as needed for rhinitis. 11/01/19   [provider]  gabapentin  (NEURONTIN ) 800 MG tablet Take 800 mg by mouth at bedtime. 11/14/21   [provider]  lisinopril  (ZESTRIL ) 40 MG tablet Take 40 mg by mouth every morning. 11/14/21   [provider]  losartan  (COZAAR ) 25 MG tablet Take 1 tablet (25 mg total) by mouth daily. 03/17/21 09/04/23  Arrien, Mauricio Daniel, MD  mirtazapine  (REMERON ) 15 MG tablet Take 1 tablet (15 mg total) by mouth at bedtime. 03/17/21 12/08/22  Arrien, Curlee Doss, MD  montelukast  (  SINGULAIR ) 10 MG tablet Take 10 mg by mouth daily. 02/23/22   [provider]  pantoprazole  (PROTONIX ) 40 MG tablet Take 1 tablet (40 mg total) by mouth 2 (two) times daily. 03/17/21 06/09/22  Arrien, Mauricio Daniel, MD  QUEtiapine  (SEROQUEL ) 25 MG tablet Take 1 tablet (25 mg total) by mouth at bedtime. For agitation 02/24/21   Asuncion Layer I, NP  risperiDONE  (RISPERDAL ) 2 MG tablet Take 1 tablet (2 mg total) by mouth 2 (two) times daily. For mood control 02/24/21   Asuncion Layer I, NP  SYMBICORT  160-4.5 MCG/ACT inhaler Inhale 2 puffs into the lungs 2 (two) times daily. 10/03/19   [provider]  traZODone  (DESYREL ) 50 MG tablet Take 1 tablet (50 mg total) by mouth at bedtime as needed for sleep. Patient not taking: Reported on 09/04/2023 02/24/21   Asuncion Layer I, NP  valbenazine  (INGREZZA ) 80 MG capsule Take 1 capsule (80 mg total) by mouth at bedtime. For tardive dyskinesia 02/24/21   Asuncion Layer I, NP  cetirizine  (ZYRTEC ) 10 MG tablet Take 1 tablet (10 mg total) by mouth daily. Patient not taking: Reported on 10/22/2019 02/26/18 11/15/19  Newlin, Enobong, MD  haloperidol  (HALDOL ) 10 MG tablet Take 0.5 tablets (5 mg total) by mouth 2 (two) times daily. Patient not taking: Reported on 10/22/2019 06/18/17 11/15/19  Deforest Fast, MD     Physical Exam: Vitals:    01/23/24 0400 01/23/24 0430 01/23/24 0500 01/23/24 0600  BP: 98/68 (!) 119/91 107/74 111/76  Pulse: 72 74 75 73  Resp: 16 15 18 18   Temp:  98.4 F (36.9 C)    TempSrc:  Oral    SpO2: 96% 99% 98% 97%  Weight:      Height:        Physical Exam Constitutional:      Comments: Lethargic and drowsy  HENT:     Head: Normocephalic and atraumatic.     Mouth/Throat:     Mouth: Mucous membranes are dry.  Eyes:     Pupils: Pupils are equal, round, and reactive to light.  Cardiovascular:     Rate and Rhythm: Normal rate and regular rhythm.  Pulmonary:     Effort: Pulmonary effort is normal.     Breath sounds: Normal breath sounds.  Abdominal:     Palpations: Abdomen is soft.  Musculoskeletal:     Cervical back: Neck supple.     Right lower leg: No edema.     Left lower leg: No edema.  Skin:    General: Skin is dry.     Capillary Refill: Capillary refill takes less than 2 seconds.  Neurological:     Comments: Alert and oriented to name and place  Psychiatric:     Comments: Unable to assess      Labs on Admission: I have personally reviewed following labs and imaging studies  CBC: Recent Labs  Lab 01/22/24 2215 01/22/24 2221 01/23/24 0440  WBC 6.2  --  4.6  HGB 14.1 15.3 14.0  HCT 43.1 45.0 42.5  MCV 103.4*  --  104.2*  PLT 182  --  142*   Basic Metabolic Panel: Recent Labs  Lab 01/22/24 2215 01/22/24 2221 01/23/24 0440  NA 142 140 140  K 3.6 3.4* 4.1  CL 99 101 104  CO2 25  --  23  GLUCOSE 107* 105* 90  BUN 30* 32* 29*  CREATININE 5.57* 5.80* 4.19*  CALCIUM  10.0  --  8.9   GFR: Estimated Creatinine Clearance: 15 mL/min (  A) (by C-G formula based on SCr of 4.19 mg/dL (H)). Liver Function Tests: Recent Labs  Lab 01/22/24 2215 01/23/24 0440  AST 39 45*  ALT 23 24  ALKPHOS 46 43  BILITOT 0.9 1.0  PROT 7.2 6.3*  ALBUMIN  4.0 3.4*   No results for input(s): "LIPASE", "AMYLASE" in the last 168 hours. Recent Labs  Lab 01/23/24 0528  AMMONIA 47*    Coagulation Profile: No results for input(s): "INR", "PROTIME" in the last 168 hours. Cardiac Enzymes: No results for input(s): "CKTOTAL", "CKMB", "CKMBINDEX", "TROPONINI", "TROPONINIHS" in the last 168 hours. BNP (last 3 results) No results for input(s): "BNP" in the last 8760 hours. HbA1C: No results for input(s): "HGBA1C" in the last 72 hours. CBG: No results for input(s): "GLUCAP" in the last 168 hours. Lipid Profile: No results for input(s): "CHOL", "HDL", "LDLCALC", "TRIG", "CHOLHDL", "LDLDIRECT" in the last 72 hours. Thyroid  Function Tests: Recent Labs    01/22/24 2215  TSH 1.152   Anemia Panel: No results for input(s): "VITAMINB12", "FOLATE", "FERRITIN", "TIBC", "IRON", "RETICCTPCT" in the last 72 hours. Urine analysis:    Component Value Date/Time   COLORURINE AMBER (A) 03/08/2021 1622   APPEARANCEUR HAZY (A) 03/08/2021 1622   LABSPEC 1.026 03/08/2021 1622   PHURINE 5.0 03/08/2021 1622   GLUCOSEU NEGATIVE 03/08/2021 1622   GLUCOSEU NEG mg/dL 16/06/9603 5409   HGBUR NEGATIVE 03/08/2021 1622   BILIRUBINUR NEGATIVE 03/08/2021 1622   KETONESUR 20 (A) 03/08/2021 1622   PROTEINUR 30 (A) 03/08/2021 1622   UROBILINOGEN 1 10/11/2006 2016   NITRITE NEGATIVE 03/08/2021 1622   LEUKOCYTESUR NEGATIVE 03/08/2021 1622    Radiological Exams on Admission: I have personally reviewed images CT Head Wo Contrast Result Date: 01/22/2024 CLINICAL DATA:  Head trauma, moderate-severe; Neck trauma, dangerous injury mechanism (Age 85-64y) Fall EXAM: CT HEAD WITHOUT CONTRAST CT CERVICAL SPINE WITHOUT CONTRAST TECHNIQUE: Multidetector CT imaging of the head and cervical spine was performed following the standard protocol without intravenous contrast. Multiplanar CT image reconstructions of the cervical spine were also generated. RADIATION DOSE REDUCTION: This exam was performed according to the departmental dose-optimization program which includes automated exposure control, adjustment of the  mA and/or kV according to patient size and/or use of iterative reconstruction technique. COMPARISON:  CT head C-spine 03/08/2021 FINDINGS: CT HEAD FINDINGS Brain: Patchy and confluent areas of decreased attenuation are noted throughout the deep and periventricular white matter of the cerebral hemispheres bilaterally, compatible with chronic microvascular ischemic disease. No evidence of large-territorial acute infarction. No parenchymal hemorrhage. No mass lesion. No extra-axial collection. No mass effect or midline shift. No hydrocephalus. Basilar cisterns are patent. Vascular: No hyperdense vessel. Atherosclerotic calcifications are present within the cavernous internal carotid arteries. Skull: No acute fracture or focal lesion. Sinuses/Orbits: Polypoid-like mucosal thickening of the left maxilla. Otherwise paranasal sinuses and mastoid air cells are clear. The orbits are unremarkable. Other: None. CT CERVICAL SPINE FINDINGS Alignment: Normal. Skull base and vertebrae: Moderate degenerative change of the spine no acute fracture. No aggressive appearing focal osseous lesion or focal pathologic process. Soft tissues and spinal canal: No prevertebral fluid or swelling. No visible canal hematoma. Upper chest: Emphysema changes. Biapical pleural/pulmonary scarring. Other: None. IMPRESSION: 1. No acute intracranial abnormality. 2. No acute displaced fracture or traumatic listhesis of the cervical spine. 3.  Emphysema (ICD10-J43.9). Electronically Signed   By: Morgane  Naveau M.D.   On: 01/22/2024 23:06   CT Cervical Spine Wo Contrast Result Date: 01/22/2024 CLINICAL DATA:  Head trauma, moderate-severe; Neck trauma, dangerous  injury mechanism (Age 90-64y) Fall EXAM: CT HEAD WITHOUT CONTRAST CT CERVICAL SPINE WITHOUT CONTRAST TECHNIQUE: Multidetector CT imaging of the head and cervical spine was performed following the standard protocol without intravenous contrast. Multiplanar CT image reconstructions of the cervical  spine were also generated. RADIATION DOSE REDUCTION: This exam was performed according to the departmental dose-optimization program which includes automated exposure control, adjustment of the mA and/or kV according to patient size and/or use of iterative reconstruction technique. COMPARISON:  CT head C-spine 03/08/2021 FINDINGS: CT HEAD FINDINGS Brain: Patchy and confluent areas of decreased attenuation are noted throughout the deep and periventricular white matter of the cerebral hemispheres bilaterally, compatible with chronic microvascular ischemic disease. No evidence of large-territorial acute infarction. No parenchymal hemorrhage. No mass lesion. No extra-axial collection. No mass effect or midline shift. No hydrocephalus. Basilar cisterns are patent. Vascular: No hyperdense vessel. Atherosclerotic calcifications are present within the cavernous internal carotid arteries. Skull: No acute fracture or focal lesion. Sinuses/Orbits: Polypoid-like mucosal thickening of the left maxilla. Otherwise paranasal sinuses and mastoid air cells are clear. The orbits are unremarkable. Other: None. CT CERVICAL SPINE FINDINGS Alignment: Normal. Skull base and vertebrae: Moderate degenerative change of the spine no acute fracture. No aggressive appearing focal osseous lesion or focal pathologic process. Soft tissues and spinal canal: No prevertebral fluid or swelling. No visible canal hematoma. Upper chest: Emphysema changes. Biapical pleural/pulmonary scarring. Other: None. IMPRESSION: 1. No acute intracranial abnormality. 2. No acute displaced fracture or traumatic listhesis of the cervical spine. 3.  Emphysema (ICD10-J43.9). Electronically Signed   By: Morgane  Naveau M.D.   On: 01/22/2024 23:06     EKG: My personal interpretation of EKG shows: Normal sinus rhythm heart rate 80 and left ventricular hypertrophy pattern    Assessment/Plan: Principal Problem:   Acute kidney injury (HCC) Active Problems:   AKI  (acute kidney injury) (HCC)   Hypotension   Acute metabolic encephalopathy   History of essential hypertension   History of depression   COPD (chronic obstructive pulmonary disease) (HCC)   History of HIV infection (HCC)   History of pulmonary embolus (PE)   History of DVT (deep vein thrombosis)   Continuous dependence on cigarette smoking   Syncope due to hypotension and hypovolemia    Assessment and Plan: Acute kidney injury in the setting of hypotension Hypotension in the setting of accidentally taking lisinopril  40 mg x 2 -Presented emergency department complaining of hypotension which caused syncopal episode x 3.  On the third episode of syncope patient called EMS and he was brought to the ED for evaluation. - At presentation to ED patient found hypotensive blood pressure of 62/55 otherwise hemodynamically stable.  Patient reported he took lisinopril , losartan  and amlodipine . -CMP showed elevated creatinine 5.57.  At baseline patient has GFR above 60. - Pending UA. - Acute kidney injury in the setting of hypotension driven by polypharmacy. - In the ED patient has been given total 2 L of NS and 1 L of LR.  Currently on LR at 150 cc/h.  Blood pressure has been improved MAP is around 70 now. -Continue to monitor renal function, avoid nephrotoxic agent monitor urine output.    Syncope due to hypotension and hypovolemia Acute metabolic encephalopathy in the setting of hypotension -Patient reported 3 syncopal episode.  Denies any head injury, chest pain, palpitation and seizure. - Syncope in the setting of hypotension. - EKG showing normal sinus rhythm heart rate 80. - CT head no acute intracranial abnormality and fracture. - However  patient is sleepy and lethargic..  Given history of chronic alcohol use and unreliable historian checking UDS, serum drug screen.  Checking blood alcohol level.  Checking ammonia.  TSH within normal range. - Continue to monitor improvement of mental  status. Addendum - Elevated ammonia 47.  Starting lactulose 10 g 3 times daily for 1 day.   History of essential hypertension Hypotension due to medication side effect -Hypotension in the setting of combining amlodipine , losartan  and lisinopril . - Currently on aggressive IV fluid hydration. -Pressure is gradually improving BiPAP of 70.  History of depression -Awaiting for pharmacy verification of antidepressant medication.  History of VT - Stable.  Continue Dulera  twice daily and DuoNeb as needed   History of HIV -Continue Biktarvy   History of DVT and PE s/p IVC filter and on Eliquis  -Continue Eliquis  twice daily   DVT prophylaxis:  Eliquis  Code Status:  Full Code Diet: Heart healthy diet Family Communication:   Currently no family member at bedside Disposition Plan: Monitor for improvement of renal function and blood pressure.  If renal function does not improve need to consult nephrology for evaluation. Consults: None at this time. Admission status:   Observation, Telemetry bed  Severity of Illness: The appropriate patient status for this patient is OBSERVATION. Observation status is judged to be reasonable and necessary in order to provide the required intensity of service to ensure the patient's safety. The patient's presenting symptoms, physical exam findings, and initial radiographic and laboratory data in the context of their medical condition is felt to place them at decreased risk for further clinical deterioration. Furthermore, it is anticipated that the patient will be medically stable for discharge from the hospital within 2 midnights of admission.     Aser Nylund, MD [facility]  How to contact the [facility] Attending or Consulting provider 7A - 7P or covering provider during after hours 7P -7A, for this patient.  Check the care team in [facility] and look for a) attending/consulting [facility] provider listed and b) the [facility] team listed Log into [email or web address] and use  [facility]'s universal password to access. If you do not have the password, please contact the hospital operator. Locate the [facility] provider you are looking for under [facility] and page to a number that you can be directly reached. If you still have difficulty reaching the provider, please page the [facility] (Director on Call) for the Hospitalists listed on amion for assistance.  01/23/2024, 6:49 AM

## 2024-01-23 NOTE — Progress Notes (Signed)
 Same day note   [name] Herring is a 63 y.o. male with medical history significant of HIV, essential hypertension, DVT and PE status post IVC filter 2018, COPD, depression, chronic smoking, schizophrenia, chronic alcohol use, personality disorder, GERD, HIV associated severe protein calorie malnutrition, dyslipidemia and hypothyroidism presented to the hospital secondary to hypotension after accidentally taking lisinopri.  Patient was noted to be hypotensive with blood pressure was 65/52 which  improved to 98/69 after giving total 2 L of NS bolus.  Initially patient was drowsy and sleepy.  He also took 3 other blood pressure medications at the same time.  Lab wise his CBC was unremarkable.  Creatinine was noted to be 5.5 with baseline creatinine levels at normal.  Lactic acid was normal.  CT head was unremarkable including CT cervical spine.  EKG showed normal sinus rhythm.  Patient was then considered for admission to hospital for acute kidney injury in the setting of hypotension and hypotension due to antihypertensive medication overdose by accident.  Patient seen and examined at bedside.  Patient was admitted to the hospital for acute kidney injury hypotension.  Complains of mild nausea and headache.  At the time of my evaluation, patient complains of mild nausea and headache.  Physical examination reveals thinly built male, slightly fidgety, decreased skin turgor.  Laboratory data and imaging was reviewed   Assessment and Plan:  Acute kidney injury secondary to hypotension from antihypertensive medication overdose. Presented with syncopal episodes and was very hypotensive.  Patient reportedly took lisinopril , losartan  and amlodipine .  Initial creatinine elevated at 5.5.  Creatinine has improved at 4.1 after IV fluids.  Received IV fluid bolus.  Blood pressure has improved.  Continue to monitor renal function.  Strict intake and output charting, daily weights.  Check BMP in AM.    Syncope due  to hypotension and hypovolemia Acute metabolic encephalopathy in the setting of hypotension EKG was unremarkable including CT head scan.  Ammonia was slightly elevated at 47.  Alcohol level less than 15.  Patient without leukocytosis.  Has been started on lactulose.  AST slightly elevated.      History of essential hypertension now with hypotension. On amlodipine  lisinopril  and losartan  at home.  Continue to hold.  History of depression Awaiting med rec.  Hold until mentally clear.   History of VT No arrhythmias at this time.    History of HIV -Continue Biktarvy    History of DVT and PE s/p IVC filter and on Eliquis  -Continue Eliquis  twice daily     No Charge  Signed,  Lindwood Rhody, MD [facility]

## 2024-01-23 NOTE — Hospital Course (Signed)
 Same day note   [name] Herring is a 63 y.o. male with medical history significant of HIV, essential hypertension, DVT and PE status post IVC filter 2018, COPD, depression, chronic smoking, schizophrenia, chronic alcohol use, personality disorder, GERD, HIV associated severe protein calorie malnutrition, dyslipidemia and hypothyroidism presented to the hospital secondary to hypotension after accidentally taking lisinopri.  Patient was noted to be hypotensive with blood pressure was 65/52 which  improved to 98/69 after giving total 2 L of NS bolus.  Initially patient was drowsy and sleepy.  He also took 3 other blood pressure medications at the same time.  Lab wise his CBC was unremarkable.  Creatinine was noted to be 5.5 with baseline creatinine levels at normal.  Lactic acid was normal.  CT head was unremarkable including CT cervical spine.  EKG showed normal sinus rhythm.  Patient was then considered for admission to hospital for acute kidney injury in the setting of hypotension and hypotension due to antihypertensive medication overdose by accident.  Patient seen and examined at bedside.  Patient was admitted to the hospital for  At the time of my evaluation, patient complains of Physical examination reveals  Laboratory data and imaging was reviewed   Assessment and Plan:  Acute kidney injury secondary to hypotension from antihypertensive medication overdose. Presented with syncopal episodes and was very hypotensive.  Patient reportedly took lisinopril , losartan  and amlodipine .  Initial creatinine elevated at 5.5.  Creatinine has improved at 4.1 after IV fluids.  Received IV fluid bolus.  Blood pressure has improved.  Continue to monitor renal function.  Strict intake and output charting, daily weights.    Syncope due to hypotension and hypovolemia Acute metabolic encephalopathy in the setting of hypotension EKG was unremarkable including CT head scan.  Ammonia was slightly elevated at 47.   Alcohol level less than 15.  Patient without leukocytosis.  Has been started on lactulose.  AST slightly elevated.      History of essential hypertension now with hypotension. On amlodipine  lisinopril  and losartan  at home.  Continue to hold.  History of depression Awaiting med rec.  Hold until mentally clear.   History of VT No arrhythmias at this time.     History of HIV -Continue Biktarvy    History of DVT and PE s/p IVC filter and on Eliquis  -Continue Eliquis  twice daily      No Charge  Signed,  Lindwood Rhody, MD [facility]

## 2024-01-24 ENCOUNTER — Inpatient Hospital Stay (HOSPITAL_COMMUNITY)

## 2024-01-24 DIAGNOSIS — R55 Syncope and collapse: Secondary | ICD-10-CM

## 2024-01-24 DIAGNOSIS — N179 Acute kidney failure, unspecified: Secondary | ICD-10-CM | POA: Diagnosis not present

## 2024-01-24 LAB — BASIC METABOLIC PANEL WITH GFR
Anion gap: 10 (ref 5–15)
BUN: 25 mg/dL — ABNORMAL HIGH (ref 8–23)
CO2: 25 mmol/L (ref 22–32)
Calcium: 9.3 mg/dL (ref 8.9–10.3)
Chloride: 107 mmol/L (ref 98–111)
Creatinine, Ser: 1.33 mg/dL — ABNORMAL HIGH (ref 0.61–1.24)
GFR, Estimated: >60 mL/min (ref >60)
Glucose, Bld: 90 mg/dL (ref 70–99)
Potassium: 3.8 mmol/L (ref 3.5–5.1)
Sodium: 142 mmol/L (ref 135–145)

## 2024-01-24 LAB — ECHOCARDIOGRAM COMPLETE
AR max vel: 3.77 cm2
AV Area VTI: 3.56 cm2
AV Area mean vel: 3.35 cm2
AV Mean grad: 4 mmHg
AV Peak grad: 7.1 mmHg
Ao pk vel: 1.33 m/s
Area-P 1/2: 2.99 cm2
Calc EF: 60.1 %
Height: 69 in
MV VTI: 3.06 cm2
S' Lateral: 2.8 cm
Single Plane A2C EF: 61 %
Single Plane A4C EF: 65.6 %
Weight: 2049.4 [oz_av]

## 2024-01-24 LAB — CBC
HCT: 36.2 % — ABNORMAL LOW (ref 39.0–52.0)
Hemoglobin: 12.1 g/dL — ABNORMAL LOW (ref 13.0–17.0)
MCH: 34.1 pg — ABNORMAL HIGH (ref 26.0–34.0)
MCHC: 33.4 g/dL (ref 30.0–36.0)
MCV: 102 fL — ABNORMAL HIGH (ref 80.0–100.0)
Platelets: 132 10*3/uL — ABNORMAL LOW (ref 150–400)
RBC: 3.55 MIL/uL — ABNORMAL LOW (ref 4.22–5.81)
RDW: 16.7 % — ABNORMAL HIGH (ref 11.5–15.5)
WBC: 4.2 10*3/uL (ref 4.0–10.5)
nRBC: 0 % (ref 0.0–0.2)

## 2024-01-24 LAB — MAGNESIUM: Magnesium: 2 mg/dL (ref 1.7–2.4)

## 2024-01-24 NOTE — Progress Notes (Signed)
  Echocardiogram 2D Echocardiogram has been performed.  Darron Stuck L Yentl Verge RDCS 01/24/2024, 11:30 AM

## 2024-01-24 NOTE — Discharge Summary (Signed)
 Physician Discharge Summary  [name] Herring ZOX:[id number] DOB: 1960/10/30 DOA: 01/22/2024  PCP: Shannan Dart., FNP  Admit date: 01/22/2024 Discharge date: 01/24/2024  Admitted From: Home  Discharge disposition: Home  Recommendations for Outpatient Follow-Up:   Follow up with your primary care provider in one week.  Check CBC, BMP, magnesium  in the next visit Encouraged increased fluid intake.   Discharge Diagnosis:   Principal Problem:   Acute kidney injury (HCC) Active Problems:   AKI (acute kidney injury) (HCC)   Hypotension   Acute metabolic encephalopathy   History of essential hypertension   History of depression   COPD (chronic obstructive pulmonary disease) (HCC)   History of HIV infection (HCC)   History of pulmonary embolus (PE)   History of DVT (deep vein thrombosis)   Continuous dependence on cigarette smoking   Syncope due to hypotension and hypovolemia   Discharge Condition: Improved.  Diet recommendation: Low sodium, heart healthy.    Wound care: None.  Code status: Full.   History of Present Illness:     [name] Herring is a 63 y.o. male with medical history significant of HIV, essential hypertension, DVT and PE status post IVC filter 2018, COPD, depression, chronic smoking, schizophrenia, chronic alcohol use, personality disorder, GERD, HIV associated severe protein calorie malnutrition, dyslipidemia and hypothyroidism presented to the hospital secondary to hypotension after accidentally taking lisinopri.  Patient was noted to be hypotensive with blood pressure was 65/52 which  improved to 98/69 after giving total 2 L of NS bolus.  Initially patient was drowsy and sleepy.  He also took 3 other blood pressure medications at the same time.  Lab wise his CBC was unremarkable.  Creatinine was noted to be 5.5 with baseline creatinine levels at normal.  Lactic acid was normal.  CT head was unremarkable including CT cervical spine.  EKG showed normal  sinus rhythm.  Patient was then considered for admission to hospital for acute kidney injury in the setting of hypotension and hypotension due to antihypertensive medication overdose by accident.   Hospital Course:   Following conditions were addressed during hospitalization as listed below,  Acute kidney injury secondary to hypotension from antihypertensive medication overdose. Presented with syncopal episodes and was very hypotensive.  Patient reportedly took lisinopril , losartan  and amlodipine .  Initial creatinine elevated at 5.5.  Creatinine has improved at this time to 1.3.  Patient feels significantly better and wishes to go home today.  Has been able to ambulate without any dizziness or symptoms.  Patient was advised to hold lisinopril  for next few days and be careful when taking antihypertensive medications.  Recommend outpatient follow-up with PCP in 1 week with blood work.   Syncope due to hypotension and hypovolemia Acute metabolic encephalopathy in the setting of hypotension EKG was unremarkable including CT head scan.  Ammonia was slightly elevated at 47.  Alcohol level less than 15.  Patient without leukocytosis.  At this patient is back to baseline.  Has significantly improved.   History of essential hypertension now with hypotension. On amlodipine , lisinopril  and losartan  at home.  Will discontinue losartan .  Hold lisinopril  for next few days.   History of depression On Cogentin , Neurontin , Seroquel , risperidone  and trazodone  at home.   History of VT No arrhythmias at this time.    History of HIV -Continue Biktarvy  on discharge.   History of DVT and PE s/p IVC filter and on Eliquis  -Continue Eliquis  twice daily   Disposition.  At this time, patient is  stable for disposition home with outpatient PCP follow-up.  Medical Consultants:   None.  Procedures:    None Subjective:   Today, patient was seen and examined at bedside.  Feels better.  No nausea vomiting  dizziness lightheadedness.  Has ambulated to the bathroom.  Wants to go home.  Discharge Exam:   Vitals:   01/24/24 0532 01/24/24 0916  BP: (!) 147/93 124/74  Pulse: 65 81  Resp: 18 19  Temp: 98 F (36.7 C) 97.9 F (36.6 C)  SpO2: 100% 96%   Vitals:   01/23/24 1628 01/23/24 2054 01/24/24 0532 01/24/24 0916  BP: 106/72 106/73 (!) 147/93 124/74  Pulse: 76 76 65 81  Resp: 18 18 18 19   Temp:  98.3 F (36.8 C) 98 F (36.7 C) 97.9 F (36.6 C)  TempSrc:  Oral  Oral  SpO2: 95% 96% 100% 96%  Weight:      Height:       Body mass index is 18.92 kg/m.  General: Alert awake, not in obvious distress, thinly built. HENT: pupils equally reacting to light,  No scleral pallor or icterus noted. Oral mucosa is moist.  Chest:  Clear breath sounds.  Diminished breath sounds bilaterally. No crackles or wheezes.  CVS: S1 &S2 heard. No murmur.  Regular rate and rhythm. Abdomen: Soft, nontender, nondistended.  Bowel sounds are heard.   Extremities: No cyanosis, clubbing or edema.  Peripheral pulses are palpable. Psych: Alert, awake and oriented, normal mood CNS:  No cranial nerve deficits.  Power equal in all extremities.   Skin: Warm and dry.  No rashes noted.  The results of significant diagnostics from this hospitalization (including imaging, microbiology, ancillary and laboratory) are listed below for reference.     Diagnostic Studies:   CT Head Wo Contrast Result Date: 01/22/2024 CLINICAL DATA:  Head trauma, moderate-severe; Neck trauma, dangerous injury mechanism (Age 14-64y) Fall EXAM: CT HEAD WITHOUT CONTRAST CT CERVICAL SPINE WITHOUT CONTRAST TECHNIQUE: Multidetector CT imaging of the head and cervical spine was performed following the standard protocol without intravenous contrast. Multiplanar CT image reconstructions of the cervical spine were also generated. RADIATION DOSE REDUCTION: This exam was performed according to the departmental dose-optimization program which includes  automated exposure control, adjustment of the mA and/or kV according to patient size and/or use of iterative reconstruction technique. COMPARISON:  CT head C-spine 03/08/2021 FINDINGS: CT HEAD FINDINGS Brain: Patchy and confluent areas of decreased attenuation are noted throughout the deep and periventricular white matter of the cerebral hemispheres bilaterally, compatible with chronic microvascular ischemic disease. No evidence of large-territorial acute infarction. No parenchymal hemorrhage. No mass lesion. No extra-axial collection. No mass effect or midline shift. No hydrocephalus. Basilar cisterns are patent. Vascular: No hyperdense vessel. Atherosclerotic calcifications are present within the cavernous internal carotid arteries. Skull: No acute fracture or focal lesion. Sinuses/Orbits: Polypoid-like mucosal thickening of the left maxilla. Otherwise paranasal sinuses and mastoid air cells are clear. The orbits are unremarkable. Other: None. CT CERVICAL SPINE FINDINGS Alignment: Normal. Skull base and vertebrae: Moderate degenerative change of the spine no acute fracture. No aggressive appearing focal osseous lesion or focal pathologic process. Soft tissues and spinal canal: No prevertebral fluid or swelling. No visible canal hematoma. Upper chest: Emphysema changes. Biapical pleural/pulmonary scarring. Other: None. IMPRESSION: 1. No acute intracranial abnormality. 2. No acute displaced fracture or traumatic listhesis of the cervical spine. 3.  Emphysema (ICD10-J43.9). Electronically Signed   By: Morgane  Naveau M.D.   On: 01/22/2024 23:06   CT Cervical  Spine Wo Contrast Result Date: 01/22/2024 CLINICAL DATA:  Head trauma, moderate-severe; Neck trauma, dangerous injury mechanism (Age 10-64y) Fall EXAM: CT HEAD WITHOUT CONTRAST CT CERVICAL SPINE WITHOUT CONTRAST TECHNIQUE: Multidetector CT imaging of the head and cervical spine was performed following the standard protocol without intravenous contrast.  Multiplanar CT image reconstructions of the cervical spine were also generated. RADIATION DOSE REDUCTION: This exam was performed according to the departmental dose-optimization program which includes automated exposure control, adjustment of the mA and/or kV according to patient size and/or use of iterative reconstruction technique. COMPARISON:  CT head C-spine 03/08/2021 FINDINGS: CT HEAD FINDINGS Brain: Patchy and confluent areas of decreased attenuation are noted throughout the deep and periventricular white matter of the cerebral hemispheres bilaterally, compatible with chronic microvascular ischemic disease. No evidence of large-territorial acute infarction. No parenchymal hemorrhage. No mass lesion. No extra-axial collection. No mass effect or midline shift. No hydrocephalus. Basilar cisterns are patent. Vascular: No hyperdense vessel. Atherosclerotic calcifications are present within the cavernous internal carotid arteries. Skull: No acute fracture or focal lesion. Sinuses/Orbits: Polypoid-like mucosal thickening of the left maxilla. Otherwise paranasal sinuses and mastoid air cells are clear. The orbits are unremarkable. Other: None. CT CERVICAL SPINE FINDINGS Alignment: Normal. Skull base and vertebrae: Moderate degenerative change of the spine no acute fracture. No aggressive appearing focal osseous lesion or focal pathologic process. Soft tissues and spinal canal: No prevertebral fluid or swelling. No visible canal hematoma. Upper chest: Emphysema changes. Biapical pleural/pulmonary scarring. Other: None. IMPRESSION: 1. No acute intracranial abnormality. 2. No acute displaced fracture or traumatic listhesis of the cervical spine. 3.  Emphysema (ICD10-J43.9). Electronically Signed   By: Morgane  Naveau M.D.   On: 01/22/2024 23:06     Labs:   Basic Metabolic Panel: Recent Labs  Lab 01/22/24 2215 01/22/24 2221 01/23/24 0440 01/24/24 0654  NA 142 140 140 142  K 3.6 3.4* 4.1 3.8  CL 99 101 104  107  CO2 25  --  23 25  GLUCOSE 107* 105* 90 90  BUN 30* 32* 29* 25*  CREATININE 5.57* 5.80* 4.19* 1.33*  CALCIUM  10.0  --  8.9 9.3  MG  --   --   --  2.0   GFR Estimated Creatinine Clearance: 47.3 mL/min (A) (by C-G formula based on SCr of 1.33 mg/dL (H)). Liver Function Tests: Recent Labs  Lab 01/22/24 2215 01/23/24 0440  AST 39 45*  ALT 23 24  ALKPHOS 46 43  BILITOT 0.9 1.0  PROT 7.2 6.3*  ALBUMIN  4.0 3.4*   No results for input(s): "LIPASE", "AMYLASE" in the last 168 hours. Recent Labs  Lab 01/23/24 0528  AMMONIA 47*   Coagulation profile No results for input(s): "INR", "PROTIME" in the last 168 hours.  CBC: Recent Labs  Lab 01/22/24 2215 01/22/24 2221 01/23/24 0440 01/24/24 0654  WBC 6.2  --  4.6 4.2  HGB 14.1 15.3 14.0 12.1*  HCT 43.1 45.0 42.5 36.2*  MCV 103.4*  --  104.2* 102.0*  PLT 182  --  142* 132*   Cardiac Enzymes: No results for input(s): "CKTOTAL", "CKMB", "CKMBINDEX", "TROPONINI" in the last 168 hours. BNP: Invalid input(s): "POCBNP" CBG: No results for input(s): "GLUCAP" in the last 168 hours. D-Dimer No results for input(s): "DDIMER" in the last 72 hours. Hgb A1c No results for input(s): "HGBA1C" in the last 72 hours. Lipid Profile No results for input(s): "CHOL", "HDL", "LDLCALC", "TRIG", "CHOLHDL", "LDLDIRECT" in the last 72 hours. Thyroid  function studies Recent Labs  01/22/24 2215  TSH 1.152   Anemia work up No results for input(s): "VITAMINB12", "FOLATE", "FERRITIN", "TIBC", "IRON", "RETICCTPCT" in the last 72 hours. Microbiology No results found for this or any previous visit (from the past 240 hours).   Discharge Instructions:   Discharge Instructions     Diet - low sodium heart healthy   Complete by: As directed    Discharge instructions   Complete by: As directed    Follow-up with your primary care provider in 1 week.  Increase fluid intake.  Check blood work in 1 week.  Seek medical attention for worsening  symptoms.   Increase activity slowly   Complete by: As directed       Allergies as of 01/24/2024       Reactions   Amoxicillin Shortness Of Breath, Rash   Latex Shortness Of Breath   Abilify  [aripiprazole ] Other (See Comments)   Double vision        Medication List     PAUSE taking these medications    lisinopril  40 MG tablet Wait to take this until: Jan 26, 2024 Morning Commonly known as: ZESTRIL  Take 40 mg by mouth every morning.       STOP taking these medications    losartan  25 MG tablet Commonly known as: COZAAR        TAKE these medications    acetaminophen  500 MG tablet Commonly known as: TYLENOL  Take 1 tablet (500 mg total) by mouth every 6 (six) hours as needed for moderate pain.   amLODipine  5 MG tablet Commonly known as: NORVASC  Take 1 tablet (5 mg total) by mouth daily.   apixaban  5 MG Tabs tablet Commonly known as: Eliquis  Take 1 tablet (5 mg total) by mouth 2 (two) times daily.   atorvastatin  40 MG tablet Commonly known as: LIPITOR Take 40 mg by mouth at bedtime.   benztropine  1 MG tablet Commonly known as: COGENTIN  Take 1 tablet (1 mg total) by mouth 2 (two) times daily. For eps   bictegravir-emtricitabine -tenofovir  AF 50-200-25 MG Tabs tablet Commonly known as: BIKTARVY  Take 1 tablet by mouth daily.   Cholecalciferol 25 MCG (1000 UT) capsule Take by mouth.   fluticasone  50 MCG/ACT nasal spray Commonly known as: FLONASE  Place 1-2 sprays into both nostrils daily as needed for rhinitis.   gabapentin  800 MG tablet Commonly known as: NEURONTIN  Take 800 mg by mouth at bedtime.   mirtazapine  15 MG tablet Commonly known as: REMERON  Take 1 tablet (15 mg total) by mouth at bedtime.   montelukast  10 MG tablet Commonly known as: SINGULAIR  Take 10 mg by mouth daily.   pantoprazole  40 MG tablet Commonly known as: PROTONIX  Take 1 tablet (40 mg total) by mouth 2 (two) times daily.   QUEtiapine  25 MG tablet Commonly known as:  SEROQUEL  Take 1 tablet (25 mg total) by mouth at bedtime. For agitation   risperiDONE  2 MG tablet Commonly known as: RISPERDAL  Take 1 tablet (2 mg total) by mouth 2 (two) times daily. For mood control   Symbicort  160-4.5 MCG/ACT inhaler Generic drug: budesonide -formoterol  Inhale 2 puffs into the lungs 2 (two) times daily.   traZODone  50 MG tablet Commonly known as: DESYREL  Take 1 tablet (50 mg total) by mouth at bedtime as needed for sleep.   valbenazine  80 MG capsule Commonly known as: Ingrezza  Take 1 capsule (80 mg total) by mouth at bedtime. For tardive dyskinesia        Follow-up Information     Shannan Dart.,  FNP Follow up in 1 week(s).   Specialty: Family Medicine Contact information: [address] [place] [address] [phone]                  Time coordinating discharge: 39 minutes  Signed:  Chaos Carlile  [facility] 01/24/2024, 2:34 PM

## 2024-01-24 NOTE — Plan of Care (Signed)

## 2024-01-24 NOTE — Progress Notes (Signed)
 OT Screen Note  Patient Details Name: [name] Herring MRN: [id number] DOB: 05-06-1961   Cancelled Treatment:    Reason Eval/Treat Not Completed: OT screened, no needs identified, will sign off.  Patient observed jumping out of a raised bed and quickly ambulating to the bathroom.  Also noted in the hall ambulating independently.  No follow up recommended.   Clementina Cutter 01/24/2024, 11:50 AM Chantal Comment OTR/L

## 2024-01-24 NOTE — Progress Notes (Signed)
 Reviewed AVS, patient expressed understanding of medications, MD follow up reviewed.   Patient states all belongings brought to the hospital at time of admission are accounted for and packed to take home. Patient has bus pass to be transported home.  Pt transported to Emergency room entrance to wait at bus stop for transportation home.

## 2024-01-27 LAB — DRUG SCREEN 10 W/CONF, SERUM
Amphetamines, IA: NEGATIVE ng/mL
Barbiturates, IA: NEGATIVE ug/mL
Benzodiazepines, IA: NEGATIVE ng/mL
Cocaine & Metabolite, IA: NEGATIVE ng/mL
Methadone, IA: NEGATIVE ng/mL
Opiates, IA: NEGATIVE ng/mL
Oxycodones, IA: NEGATIVE ng/mL
Phencyclidine, IA: NEGATIVE ng/mL
Propoxyphene, IA: NEGATIVE ng/mL
THC(Marijuana) Metabolite, IA: NEGATIVE ng/mL

## 2024-03-17 ENCOUNTER — Ambulatory Visit (INDEPENDENT_AMBULATORY_CARE_PROVIDER_SITE_OTHER): Payer: 59 | Admitting: Infectious Diseases

## 2024-03-17 ENCOUNTER — Encounter: Payer: Self-pay | Admitting: Infectious Diseases

## 2024-03-17 ENCOUNTER — Other Ambulatory Visit (HOSPITAL_COMMUNITY)
Admission: RE | Admit: 2024-03-17 | Discharge: 2024-03-17 | Disposition: A | Discharge status: Home / Self Care | Source: Ambulatory Visit | Attending: Infectious Diseases | Admitting: Infectious Diseases

## 2024-03-17 ENCOUNTER — Other Ambulatory Visit: Payer: Self-pay

## 2024-03-17 VITALS — BP 149/93 | HR 84 | Temp 99.0°F | Ht 69.0 in | Wt 123.0 lb

## 2024-03-17 DIAGNOSIS — B20 Human immunodeficiency virus [HIV] disease: Secondary | ICD-10-CM

## 2024-03-17 DIAGNOSIS — F200 Paranoid schizophrenia: Secondary | ICD-10-CM

## 2024-03-17 DIAGNOSIS — Z1212 Encounter for screening for malignant neoplasm of rectum: Secondary | ICD-10-CM

## 2024-03-17 DIAGNOSIS — F209 Schizophrenia, unspecified: Secondary | ICD-10-CM

## 2024-03-17 DIAGNOSIS — Z113 Encounter for screening for infections with a predominantly sexual mode of transmission: Secondary | ICD-10-CM

## 2024-03-17 MED ORDER — QUETIAPINE FUMARATE 25 MG PO TABS: 25.0000 mg | ORAL_TABLET | Freq: Every day | ORAL | 0 refills | Status: AC

## 2024-03-17 MED ORDER — RISPERIDONE 2 MG PO TABS: 2.0000 mg | ORAL_TABLET | Freq: Two times a day (BID) | ORAL | 0 refills | Status: AC

## 2024-03-17 NOTE — Patient Instructions (Addendum)
 Please continue your biktarvy  everyday   I sent in refills for your risperdal  and seroquel  - please schedule a follow up with [facility] soon so you can work with them again to help manage your mental health   [facility] -  Address: [facility], [address] [place], [place] [address] Phone: [phone]  Please come back to see us  again in 6 months

## 2024-03-17 NOTE — Progress Notes (Signed)
 Patient Name: [name] Herring  Date of Birth: 10/02/1960  MRN: [id number]  PCP: Claudene Prentice DELENA Mickey., FNP     SUBJECTIVE:  Brief Narrative:  [name] Herring is a 63 y.o. AA male with a history of HIV disease Dx 08/18/1998. Previously followed at [facility].  HIV Risk: MSM.  History of OIs: uncertain.  History of ETOH/cocaine abuse, previously homeless.    Previous Regimens:  Kaletra  + Combivir   Genvoya  > stopped d/t DDI with AC Biktarvy  2019 >> suppressed    Chief Complaint  Patient presents with   Follow-up     Discussed the use of AI scribe software for clinical note transcription with the patient, who gave verbal consent to proceed.  History of Present Illness   [name] Herring is a 63 year old male who presents for a routine follow-up care for HIV.   He feels great with no current problems or concerns. He has been out of Seroquel  and Risperdal  for about six months and has not taken them during this period. He continues to use Biktarvy  for HIV management, with refills available until December.  He confirms no changes in his medications except for the ones he is out of. He has not been sexually active for many years and denies any history of rectal cancer. He is unfamiliar with HPV.  He discusses his family, mentioning his daughter and her wife, and expresses happiness about their relationship.  No current problems or concerns. He has not been sexually active for many years and has not had rectal cancer before. He has had some anal warts removed in the past and some have returned.        09/04/2023   12:56 PM 12/07/2021    1:45 PM 07/29/2021   10:56 AM  Depression screen PHQ 2/9  Decreased Interest 0 0 0  Down, Depressed, Hopeless 0 0 0  PHQ - 2 Score 0 0 0      08/07/2018    2:38 PM 11/26/2017    3:31 PM 07/16/2017    2:10 PM 06/29/2017    2:03 PM  GAD 7 : Generalized Anxiety Score  Nervous, Anxious, on Edge 1 0 0 2  Control/stop worrying 1 1 1 3   Worry too  much - different things 1 2 1 3   Trouble relaxing 1 2 3 3   Restless 1 0 1 3  Easily annoyed or irritable 1 0 0 2  Afraid - awful might happen 1 0 0 2  Total GAD 7 Score 7 5 6 18       Review of Systems  Constitutional:  Negative for chills and fever.  HENT:  Negative for tinnitus.   Eyes:  Negative for blurred vision and photophobia.  Respiratory:  Negative for cough and sputum production.   Cardiovascular:  Negative for chest pain.  Gastrointestinal:  Negative for diarrhea, nausea and vomiting.  Genitourinary:  Negative for dysuria.  Skin:  Negative for rash.  Neurological:  Negative for headaches.     Past Medical History:  Diagnosis Date   Depression    HIV (human immunodeficiency virus infection) (HCC)    Hypertension    Immune deficiency disorder (HCC)    PE (pulmonary thromboembolism) (HCC) 2018   Personality disorder (HCC)    Schizophrenia (HCC)      Outpatient Medications Prior to Visit  Medication Sig Dispense Refill   acetaminophen  (TYLENOL ) 500 MG tablet Take 1 tablet (500 mg total) by mouth every 6 (six) hours as  needed for moderate pain. 30 tablet 0   amLODipine  (NORVASC ) 5 MG tablet Take 1 tablet (5 mg total) by mouth daily. 30 tablet 0   apixaban  (ELIQUIS ) 5 MG TABS tablet Take 1 tablet (5 mg total) by mouth 2 (two) times daily. 60 tablet 0   atorvastatin  (LIPITOR) 40 MG tablet Take 40 mg by mouth at bedtime.     benztropine  (COGENTIN ) 1 MG tablet Take 1 tablet (1 mg total) by mouth 2 (two) times daily. For eps 60 tablet 0   bictegravir-emtricitabine -tenofovir  AF (BIKTARVY ) 50-200-25 MG TABS tablet Take 1 tablet by mouth daily. 30 tablet 11   Cholecalciferol 25 MCG (1000 UT) capsule Take by mouth.     fluticasone  (FLONASE ) 50 MCG/ACT nasal spray Place 1-2 sprays into both nostrils daily as needed for rhinitis.     gabapentin  (NEURONTIN ) 800 MG tablet Take 800 mg by mouth at bedtime.     lisinopril  (ZESTRIL ) 40 MG tablet Take 40 mg by mouth every morning.      mirtazapine  (REMERON ) 15 MG tablet Take 1 tablet (15 mg total) by mouth at bedtime. 30 tablet 0   montelukast  (SINGULAIR ) 10 MG tablet Take 10 mg by mouth daily.     pantoprazole  (PROTONIX ) 40 MG tablet Take 1 tablet (40 mg total) by mouth 2 (two) times daily. 60 tablet 0   SYMBICORT  160-4.5 MCG/ACT inhaler Inhale 2 puffs into the lungs 2 (two) times daily.     valbenazine  (INGREZZA ) 80 MG capsule Take 1 capsule (80 mg total) by mouth at bedtime. For tardive dyskinesia 30 capsule 0   traZODone  (DESYREL ) 50 MG tablet Take 1 tablet (50 mg total) by mouth at bedtime as needed for sleep. (Patient not taking: Reported on 03/17/2024) 30 tablet 0   QUEtiapine  (SEROQUEL ) 25 MG tablet Take 1 tablet (25 mg total) by mouth at bedtime. For agitation (Patient not taking: Reported on 03/17/2024) 30 tablet 0   risperiDONE  (RISPERDAL ) 2 MG tablet Take 1 tablet (2 mg total) by mouth 2 (two) times daily. For mood control (Patient not taking: Reported on 03/17/2024) 60 tablet 0   No facility-administered medications prior to visit.   Allergies  Allergen Reactions   Amoxicillin Shortness Of Breath and Rash   Latex Shortness Of Breath   Abilify  [Aripiprazole ] Other (See Comments)    Double vision   Social History   Tobacco Use   Smoking status: Every Day    Current packs/day: 0.00    Average packs/day: 1 pack/day for 33.0 years (33.0 ttl pk-yrs)    Types: Cigarettes    Start date: 04/24/1986    Last attempt to quit: 04/25/2019    Years since quitting: 4.8   Smokeless tobacco: Never   Tobacco comments:    States cutting back  Vaping Use   Vaping status: Never Used  Substance Use Topics   Alcohol use: Not Currently    Comment: states once a month   Drug use: Not Currently    Comment: Patient denies use, but UDS + cocaine   Objective:  Vitals:   03/17/24 1311  BP: (!) 149/93  Pulse: 84  Temp: 99 F (37.2 C)  TempSrc: Oral  SpO2: 96%  Weight: 123 lb (55.8 kg)  Height: 5' 9 (1.753 m)   Body  mass index is 18.16 kg/m.  Physical Exam Pulmonary:     Effort: Pulmonary effort is normal.     Comments: No shortness of breath detected in conversation.  Genitourinary:    Comments: Several  anal condyloma noted around anus of various sizes. Some in anal canal that are difficult to see. DRE was negative.   Neurological:     Mental Status: He is oriented to person, place, and time.   Psychiatric:        Mood and Affect: Mood normal.        Behavior: Behavior normal.        Thought Content: Thought content normal.        Judgment: Judgment normal.     Lab Results Lab Results  Component Value Date   WBC 4.2 01/24/2024   HGB 12.1 (L) 01/24/2024   HCT 36.2 (L) 01/24/2024   MCV 102.0 (H) 01/24/2024   PLT 132 (L) 01/24/2024    Lab Results  Component Value Date   CREATININE 1.33 (H) 01/24/2024   BUN 25 (H) 01/24/2024   NA 142 01/24/2024   K 3.8 01/24/2024   CL 107 01/24/2024   CO2 25 01/24/2024    Lab Results  Component Value Date   ALT 24 01/23/2024   AST 45 (H) 01/23/2024   ALKPHOS 43 01/23/2024   BILITOT 1.0 01/23/2024    Lab Results  Component Value Date   CHOL 115 02/20/2021   HDL 59 02/20/2021   LDLCALC 42 02/20/2021   TRIG 69 02/20/2021   CHOLHDL 1.9 02/20/2021   HIV 1 RNA Quant (Copies/mL)  Date Value  09/04/2023 <20 (H)  07/29/2021 64 (H)  11/30/2020 58 (H)   CD4 T Cell Abs (/uL)  Date Value  09/04/2023 285 (L)  11/30/2020 238 (L)  05/21/2020 256 (L)   Lab Results  Component Value Date   HAV REACTIVE (A) 06/17/2018   Lab Results  Component Value Date   HEPBSAG NON-REACTIVE 06/17/2018   HEPBSAB BORDERLINE (A) 06/17/2018    ASSESSMENT & PLAN:  HIV Infection-  Very well controlled on once daily Biktarvy . No concerns with access or adherence to medication. They are tolerating the medication well without side effects. No drug interactions identified. Pertinent lab tests ordered today.  No changes to insurance coverage.  No dental needs  today.  Psychiatry referral placed  Sexual health and family planning discussed - declined - anal pap smear collected.  Statin to continue .     Schizophrenia - [name] Herring has been off Seroquel  and Risperdal  for six months and is ready to restart them. He lacks a current psychiatrist and was previously under the care of [facility]. He would like their phone number to schedule an appointment with them in a few weeks to follow up on resuming his medications.  - Send prescriptions for Seroquel  and Risperdal  to [facility] pharmacy mail delivery. - Provide [facility]'s contact information for psychiatric follow-up and further refills / adjustments of his medications   Rectal cancer screening - [name] Herring has no history of rectal cancer but has mild and moderate AIN described form 2018 pathology report. Due to his age and history, rectal cancer screening is recommended. He may benefit from referral to surgery given multiple rectal warts present to follow up on abnormal results and consider anoscopy - further recommendations pending.  - Perform rectal cancer screening using a Q-tip to collect cells for HPV testing.      Meds ordered this encounter  Medications   risperiDONE  (RISPERDAL ) 2 MG tablet    Sig: Take 1 tablet (2 mg total) by mouth 2 (two) times daily. For mood control    Dispense:  60 tablet    Refill:  0   QUEtiapine  (  SEROQUEL ) 25 MG tablet    Sig: Take 1 tablet (25 mg total) by mouth at bedtime. For agitation    Dispense:  30 tablet    Refill:  0   Orders Placed This Encounter  Procedures   HIV 1 RNA quant-no reflex-bld   T-helper cells (CD4) count   RPR   Lipid panel   COMPLETE METABOLIC PANEL WITHOUT GFR   CBC   Hepatitis C Antibody   Return in about 6 months (around 09/16/2024).  [name] Fireman, MSN, NP-C [facility] for Infectious [facility]  [email or web address] Pager: [phone] Office: [phone] [facility] Main Line: [phone]     03/17/24

## 2024-03-18 LAB — T-HELPER CELLS (CD4) COUNT (NOT AT ARMC)
CD4 % Helper T Cell: 31 % — ABNORMAL LOW (ref 33–65)
CD4 T Cell Abs: 349 /uL — ABNORMAL LOW (ref 400–1790)

## 2024-03-19 LAB — HEPATITIS C ANTIBODY: Hepatitis C Ab: NONREACTIVE

## 2024-03-19 LAB — COMPLETE METABOLIC PANEL WITHOUT GFR
AG Ratio: 1.6 (calc) (ref 1.0–2.5)
ALT: 12 U/L (ref 9–46)
AST: 16 U/L (ref 10–35)
Albumin: 3.9 g/dL (ref 3.6–5.1)
Alkaline phosphatase (APISO): 53 U/L (ref 35–144)
BUN: 15 mg/dL (ref 7–25)
CO2: 26 mmol/L (ref 20–32)
Calcium: 10.1 mg/dL (ref 8.6–10.3)
Chloride: 110 mmol/L (ref 98–110)
Creat: 1.18 mg/dL (ref 0.70–1.35)
Globulin: 2.4 g/dL (ref 1.9–3.7)
Glucose, Bld: 89 mg/dL (ref 65–99)
Potassium: 4 mmol/L (ref 3.5–5.3)
Sodium: 144 mmol/L (ref 135–146)
Total Bilirubin: 0.4 mg/dL (ref 0.2–1.2)
Total Protein: 6.3 g/dL (ref 6.1–8.1)

## 2024-03-19 LAB — CBC
HCT: 37.7 % — ABNORMAL LOW (ref 38.5–50.0)
Hemoglobin: 12.4 g/dL — ABNORMAL LOW (ref 13.2–17.1)
MCH: 34.5 pg — ABNORMAL HIGH (ref 27.0–33.0)
MCHC: 32.9 g/dL (ref 32.0–36.0)
MCV: 105 fL — ABNORMAL HIGH (ref 80.0–100.0)
MPV: 10.2 fL (ref 7.5–12.5)
Platelets: 235 10*3/uL (ref 140–400)
RBC: 3.59 10*6/uL — ABNORMAL LOW (ref 4.20–5.80)
RDW: 13.6 % (ref 11.0–15.0)
WBC: 5.4 10*3/uL (ref 3.8–10.8)

## 2024-03-19 LAB — LIPID PANEL
Cholesterol: 147 mg/dL (ref <200)
HDL: 104 mg/dL (ref >=40)
LDL Cholesterol (Calc): 29 mg/dL
Non-HDL Cholesterol (Calc): 43 mg/dL (ref <130)
Total CHOL/HDL Ratio: 1.4 (calc) (ref <5.0)
Triglycerides: 62 mg/dL (ref <150)

## 2024-03-19 LAB — HIV-1 RNA QUANT-NO REFLEX-BLD
HIV 1 RNA Quant: 25 {copies}/mL — ABNORMAL HIGH
HIV-1 RNA Quant, Log: 1.4 {Log_copies}/mL — ABNORMAL HIGH

## 2024-03-19 LAB — RPR: RPR Ser Ql: NONREACTIVE

## 2024-03-24 LAB — CYTOLOGY - PAP
Comment: NEGATIVE
Diagnosis: UNDETERMINED — AB
High risk HPV: POSITIVE — AB

## 2024-03-26 ENCOUNTER — Ambulatory Visit: Payer: Self-pay | Admitting: Infectious Diseases

## 2024-03-27 ENCOUNTER — Other Ambulatory Visit: Payer: Self-pay | Admitting: Infectious Diseases

## 2024-03-27 DIAGNOSIS — R85619 Unspecified abnormal cytological findings in specimens from anus: Secondary | ICD-10-CM

## 2024-03-27 DIAGNOSIS — A630 Anogenital (venereal) warts: Secondary | ICD-10-CM

## 2024-03-27 DIAGNOSIS — B977 Papillomavirus as the cause of diseases classified elsewhere: Secondary | ICD-10-CM

## 2024-03-27 NOTE — Telephone Encounter (Signed)
-----   Message from [place] sent at 03/26/2024  6:25 PM EDT ----- Hi team - can we call [name] Herring to let him know his anal pap came back with some abnormalities. We need to refer him to colorectal surgeon for anoscopy to better understand what risk for anal cancer he  has  They can help take care of the perinatal warts too if he like.  ----- Message ----- From: Interface, [facility] Lab Results In Sent: 03/17/2024  10:35 PM EDT To: Corean LOISE Fireman, NP

## 2024-03-27 NOTE — Telephone Encounter (Signed)
-----   Message from [place] sent at 03/26/2024  6:25 PM EDT ----- Hi team - can we call [name] Herring to let him know his anal pap came back with some abnormalities. We need to refer him to colorectal surgeon for anoscopy to better understand what risk for anal cancer he  has  They can help take care of the perinatal warts too if he like.  ----- Message ----- From: Interface, [facility] Lab Results In Sent: 03/17/2024  10:35 PM EDT To: Corean LOISE Fireman, NP

## 2024-03-27 NOTE — Telephone Encounter (Signed)
 Attempted to contact patient to make aware of abnormal anal pap results. No answer and patient's voicemail is full.   Will attempt to contact at a later time.   Enis Kleine, LPN

## 2024-03-31 NOTE — Telephone Encounter (Signed)
 Attempted to reach [name] Herring, no answer and voicemail full.  Okley Magnussen, BSN, RN

## 2024-04-04 NOTE — Telephone Encounter (Signed)
 Attempted to reach [place] again. No answer and voicemail full.   Raphel Stickles, BSN, RN

## 2024-09-09 ENCOUNTER — Ambulatory Visit: Admitting: Infectious Diseases
